# Patient Record
Sex: Female | Born: 1980
Health system: Southern US, Community
[De-identification: ages and names within clinical notes are randomized; demographics above are authoritative.]

## PROBLEM LIST (undated history)

## (undated) DIAGNOSIS — K219 Gastro-esophageal reflux disease without esophagitis: Secondary | ICD-10-CM

## (undated) DIAGNOSIS — R609 Edema, unspecified: Secondary | ICD-10-CM

## (undated) DIAGNOSIS — R0602 Shortness of breath: Secondary | ICD-10-CM

## (undated) DIAGNOSIS — T7840XA Allergy, unspecified, initial encounter: Secondary | ICD-10-CM

## (undated) DIAGNOSIS — B351 Tinea unguium: Secondary | ICD-10-CM

## (undated) DIAGNOSIS — Z91018 Allergy to other foods: Secondary | ICD-10-CM

## (undated) DIAGNOSIS — M109 Gout, unspecified: Secondary | ICD-10-CM

## (undated) DIAGNOSIS — M8560 Other cyst of bone, unspecified site: Secondary | ICD-10-CM

## (undated) DIAGNOSIS — J45909 Unspecified asthma, uncomplicated: Secondary | ICD-10-CM

## (undated) DIAGNOSIS — I10 Essential (primary) hypertension: Secondary | ICD-10-CM

## (undated) DIAGNOSIS — B353 Tinea pedis: Secondary | ICD-10-CM

## (undated) DIAGNOSIS — M2550 Pain in unspecified joint: Secondary | ICD-10-CM

## (undated) DIAGNOSIS — F329 Major depressive disorder, single episode, unspecified: Secondary | ICD-10-CM

## (undated) DIAGNOSIS — F32A Depression, unspecified: Secondary | ICD-10-CM

## (undated) DIAGNOSIS — F419 Anxiety disorder, unspecified: Secondary | ICD-10-CM

## (undated) DIAGNOSIS — L02419 Cutaneous abscess of limb, unspecified: Secondary | ICD-10-CM

## (undated) DIAGNOSIS — M1990 Unspecified osteoarthritis, unspecified site: Secondary | ICD-10-CM

## (undated) DIAGNOSIS — L03119 Cellulitis of unspecified part of limb: Secondary | ICD-10-CM

## (undated) DIAGNOSIS — G4730 Sleep apnea, unspecified: Secondary | ICD-10-CM

## (undated) DIAGNOSIS — S8990XA Unspecified injury of unspecified lower leg, initial encounter: Secondary | ICD-10-CM

## (undated) DIAGNOSIS — M79609 Pain in unspecified limb: Secondary | ICD-10-CM

## (undated) DIAGNOSIS — S99929A Unspecified injury of unspecified foot, initial encounter: Secondary | ICD-10-CM

## (undated) DIAGNOSIS — S99919A Unspecified injury of unspecified ankle, initial encounter: Secondary | ICD-10-CM

## (undated) HISTORY — PX: UPPER GI ENDOSCOPY: SHX6162

## (undated) HISTORY — DX: Gastro-esophageal reflux disease without esophagitis: K21.9

## (undated) HISTORY — DX: Depression, unspecified: F32.A

## (undated) HISTORY — DX: Unspecified injury of unspecified ankle, initial encounter: S99.919A

## (undated) HISTORY — DX: Unspecified injury of unspecified foot, initial encounter: S99.929A

## (undated) HISTORY — DX: Unspecified osteoarthritis, unspecified site: M19.90

## (undated) HISTORY — DX: Tinea unguium: B35.1

## (undated) HISTORY — DX: Allergy to other foods: Z91.018

## (undated) HISTORY — DX: Sleep apnea, unspecified: G47.30

## (undated) HISTORY — DX: Pain in unspecified joint: M25.50

## (undated) HISTORY — DX: Essential (primary) hypertension: I10

## (undated) HISTORY — DX: Pain in unspecified limb: M79.609

## (undated) HISTORY — DX: Cutaneous abscess of limb, unspecified: L02.419

## (undated) HISTORY — DX: Major depressive disorder, single episode, unspecified: F32.9

## (undated) HISTORY — DX: Anxiety disorder, unspecified: F41.9

## (undated) HISTORY — DX: Cellulitis of unspecified part of limb: L03.119

## (undated) HISTORY — DX: Tinea pedis: B35.3

## (undated) HISTORY — DX: Unspecified asthma, uncomplicated: J45.909

## (undated) HISTORY — DX: Edema, unspecified: R60.9

## (undated) HISTORY — DX: Unspecified injury of unspecified lower leg, initial encounter: S89.90XA

## (undated) HISTORY — DX: Allergy, unspecified, initial encounter: T78.40XA

## (undated) HISTORY — DX: Shortness of breath: R06.02

---

## 2002-06-19 ENCOUNTER — Encounter (INDEPENDENT_AMBULATORY_CARE_PROVIDER_SITE_OTHER): Payer: Self-pay | Admitting: Internal Medicine

## 2002-06-19 LAB — CONVERTED CEMR LAB

## 2004-02-11 ENCOUNTER — Encounter (INDEPENDENT_AMBULATORY_CARE_PROVIDER_SITE_OTHER): Payer: Self-pay | Admitting: *Deleted

## 2004-02-11 LAB — CONVERTED CEMR LAB

## 2004-05-21 ENCOUNTER — Emergency Department (HOSPITAL_COMMUNITY): Admission: EM | Admit: 2004-05-21 | Discharge: 2004-05-21 | Payer: Self-pay | Admitting: Emergency Medicine

## 2004-06-28 ENCOUNTER — Ambulatory Visit: Payer: Self-pay | Admitting: Family Medicine

## 2004-07-17 ENCOUNTER — Ambulatory Visit (HOSPITAL_BASED_OUTPATIENT_CLINIC_OR_DEPARTMENT_OTHER): Admission: RE | Admit: 2004-07-17 | Discharge: 2004-07-17 | Payer: Self-pay | Admitting: Sports Medicine

## 2004-08-06 ENCOUNTER — Ambulatory Visit: Payer: Self-pay | Admitting: Sports Medicine

## 2004-08-22 ENCOUNTER — Ambulatory Visit: Payer: Self-pay | Admitting: Sports Medicine

## 2004-09-11 ENCOUNTER — Ambulatory Visit: Payer: Self-pay | Admitting: Family Medicine

## 2004-10-29 ENCOUNTER — Ambulatory Visit: Payer: Self-pay | Admitting: Family Medicine

## 2004-11-06 ENCOUNTER — Ambulatory Visit: Payer: Self-pay | Admitting: Internal Medicine

## 2004-11-13 ENCOUNTER — Ambulatory Visit: Payer: Self-pay | Admitting: Family Medicine

## 2004-11-22 ENCOUNTER — Ambulatory Visit: Payer: Self-pay | Admitting: Internal Medicine

## 2004-11-29 ENCOUNTER — Ambulatory Visit: Payer: Self-pay | Admitting: *Deleted

## 2004-12-20 ENCOUNTER — Ambulatory Visit: Payer: Self-pay | Admitting: Internal Medicine

## 2004-12-30 ENCOUNTER — Emergency Department (HOSPITAL_COMMUNITY): Admission: EM | Admit: 2004-12-30 | Discharge: 2004-12-30 | Payer: Self-pay | Admitting: Emergency Medicine

## 2005-01-07 ENCOUNTER — Ambulatory Visit: Payer: Self-pay | Admitting: Internal Medicine

## 2005-01-22 ENCOUNTER — Ambulatory Visit: Payer: Self-pay | Admitting: Internal Medicine

## 2005-01-28 ENCOUNTER — Ambulatory Visit: Payer: Self-pay | Admitting: Internal Medicine

## 2005-01-31 ENCOUNTER — Ambulatory Visit: Payer: Self-pay | Admitting: Internal Medicine

## 2005-03-04 ENCOUNTER — Ambulatory Visit: Payer: Self-pay | Admitting: Internal Medicine

## 2005-03-26 ENCOUNTER — Ambulatory Visit: Payer: Self-pay | Admitting: Internal Medicine

## 2005-04-10 ENCOUNTER — Ambulatory Visit: Payer: Self-pay | Admitting: Family Medicine

## 2005-06-25 ENCOUNTER — Ambulatory Visit: Payer: Self-pay | Admitting: Family Medicine

## 2005-08-14 ENCOUNTER — Ambulatory Visit: Payer: Self-pay | Admitting: Family Medicine

## 2005-10-02 ENCOUNTER — Ambulatory Visit: Payer: Self-pay | Admitting: Family Medicine

## 2005-11-20 ENCOUNTER — Ambulatory Visit: Payer: Self-pay | Admitting: Family Medicine

## 2005-12-05 ENCOUNTER — Ambulatory Visit: Payer: Self-pay | Admitting: Family Medicine

## 2005-12-10 ENCOUNTER — Ambulatory Visit (HOSPITAL_BASED_OUTPATIENT_CLINIC_OR_DEPARTMENT_OTHER): Admission: RE | Admit: 2005-12-10 | Discharge: 2005-12-10 | Payer: Self-pay | Admitting: Family Medicine

## 2005-12-14 ENCOUNTER — Ambulatory Visit: Payer: Self-pay | Admitting: Family Medicine

## 2005-12-19 ENCOUNTER — Ambulatory Visit: Payer: Self-pay | Admitting: Internal Medicine

## 2006-01-20 ENCOUNTER — Encounter: Admission: RE | Admit: 2006-01-20 | Discharge: 2006-01-20 | Payer: Self-pay | Admitting: Family Medicine

## 2006-04-23 ENCOUNTER — Ambulatory Visit: Payer: Self-pay | Admitting: Family Medicine

## 2006-05-06 ENCOUNTER — Emergency Department (HOSPITAL_COMMUNITY): Admission: EM | Admit: 2006-05-06 | Discharge: 2006-05-06 | Payer: Self-pay | Admitting: Emergency Medicine

## 2006-05-11 ENCOUNTER — Ambulatory Visit (HOSPITAL_BASED_OUTPATIENT_CLINIC_OR_DEPARTMENT_OTHER): Admission: RE | Admit: 2006-05-11 | Discharge: 2006-05-11 | Payer: Self-pay | Admitting: Family Medicine

## 2006-05-16 ENCOUNTER — Ambulatory Visit: Payer: Self-pay | Admitting: Internal Medicine

## 2006-06-11 ENCOUNTER — Ambulatory Visit: Payer: Self-pay | Admitting: Family Medicine

## 2006-06-25 ENCOUNTER — Ambulatory Visit: Payer: Self-pay | Admitting: Family Medicine

## 2006-08-13 ENCOUNTER — Ambulatory Visit: Payer: Self-pay | Admitting: Family Medicine

## 2006-10-13 HISTORY — PX: WISDOM TOOTH EXTRACTION: SHX21

## 2006-10-23 ENCOUNTER — Emergency Department (HOSPITAL_COMMUNITY): Admission: EM | Admit: 2006-10-23 | Discharge: 2006-10-23 | Payer: Self-pay | Admitting: Emergency Medicine

## 2006-12-10 ENCOUNTER — Emergency Department (HOSPITAL_COMMUNITY): Admission: EM | Admit: 2006-12-10 | Discharge: 2006-12-10 | Payer: Self-pay | Admitting: Family Medicine

## 2006-12-10 DIAGNOSIS — G43909 Migraine, unspecified, not intractable, without status migrainosus: Secondary | ICD-10-CM | POA: Insufficient documentation

## 2006-12-10 DIAGNOSIS — J45909 Unspecified asthma, uncomplicated: Secondary | ICD-10-CM | POA: Insufficient documentation

## 2006-12-10 DIAGNOSIS — K219 Gastro-esophageal reflux disease without esophagitis: Secondary | ICD-10-CM | POA: Insufficient documentation

## 2006-12-10 DIAGNOSIS — F32A Depression, unspecified: Secondary | ICD-10-CM | POA: Insufficient documentation

## 2006-12-10 DIAGNOSIS — F329 Major depressive disorder, single episode, unspecified: Secondary | ICD-10-CM

## 2006-12-10 DIAGNOSIS — G4733 Obstructive sleep apnea (adult) (pediatric): Secondary | ICD-10-CM | POA: Insufficient documentation

## 2006-12-10 HISTORY — DX: Unspecified asthma, uncomplicated: J45.909

## 2006-12-11 ENCOUNTER — Encounter (INDEPENDENT_AMBULATORY_CARE_PROVIDER_SITE_OTHER): Payer: Self-pay | Admitting: *Deleted

## 2006-12-21 ENCOUNTER — Emergency Department (HOSPITAL_COMMUNITY): Admission: EM | Admit: 2006-12-21 | Discharge: 2006-12-21 | Payer: Self-pay | Admitting: Emergency Medicine

## 2006-12-29 ENCOUNTER — Ambulatory Visit: Payer: Self-pay | Admitting: Family Medicine

## 2006-12-31 ENCOUNTER — Ambulatory Visit: Payer: Self-pay | Admitting: Family Medicine

## 2007-02-09 ENCOUNTER — Ambulatory Visit: Payer: Self-pay | Admitting: Internal Medicine

## 2007-02-12 ENCOUNTER — Ambulatory Visit (HOSPITAL_COMMUNITY): Admission: RE | Admit: 2007-02-12 | Discharge: 2007-02-12 | Payer: Self-pay | Admitting: Internal Medicine

## 2007-02-18 ENCOUNTER — Encounter: Admission: RE | Admit: 2007-02-18 | Discharge: 2007-03-23 | Payer: Self-pay | Admitting: Internal Medicine

## 2007-04-01 ENCOUNTER — Ambulatory Visit: Payer: Self-pay | Admitting: Internal Medicine

## 2007-06-08 ENCOUNTER — Emergency Department (HOSPITAL_COMMUNITY): Admission: EM | Admit: 2007-06-08 | Discharge: 2007-06-08 | Payer: Self-pay | Admitting: Emergency Medicine

## 2007-07-06 ENCOUNTER — Encounter (INDEPENDENT_AMBULATORY_CARE_PROVIDER_SITE_OTHER): Payer: Self-pay | Admitting: Internal Medicine

## 2007-07-07 ENCOUNTER — Telehealth (INDEPENDENT_AMBULATORY_CARE_PROVIDER_SITE_OTHER): Payer: Self-pay | Admitting: *Deleted

## 2007-07-07 ENCOUNTER — Encounter (INDEPENDENT_AMBULATORY_CARE_PROVIDER_SITE_OTHER): Payer: Self-pay | Admitting: Internal Medicine

## 2007-09-13 ENCOUNTER — Emergency Department (HOSPITAL_COMMUNITY): Admission: EM | Admit: 2007-09-13 | Discharge: 2007-09-13 | Payer: Self-pay | Admitting: Family Medicine

## 2007-12-30 ENCOUNTER — Ambulatory Visit: Payer: Self-pay | Admitting: Nurse Practitioner

## 2007-12-30 DIAGNOSIS — L02419 Cutaneous abscess of limb, unspecified: Secondary | ICD-10-CM | POA: Insufficient documentation

## 2007-12-30 DIAGNOSIS — L03119 Cellulitis of unspecified part of limb: Secondary | ICD-10-CM | POA: Insufficient documentation

## 2007-12-30 HISTORY — DX: Cutaneous abscess of limb, unspecified: L02.419

## 2007-12-30 HISTORY — DX: Cellulitis of unspecified part of limb: L03.119

## 2008-01-22 ENCOUNTER — Emergency Department (HOSPITAL_COMMUNITY): Admission: EM | Admit: 2008-01-22 | Discharge: 2008-01-22 | Payer: Self-pay | Admitting: Family Medicine

## 2008-02-15 ENCOUNTER — Emergency Department (HOSPITAL_COMMUNITY): Admission: EM | Admit: 2008-02-15 | Discharge: 2008-02-15 | Payer: Self-pay | Admitting: Family Medicine

## 2008-03-20 ENCOUNTER — Inpatient Hospital Stay (HOSPITAL_COMMUNITY): Admission: AD | Admit: 2008-03-20 | Discharge: 2008-03-21 | Payer: Self-pay | Admitting: Gynecology

## 2008-03-20 ENCOUNTER — Emergency Department (HOSPITAL_COMMUNITY): Admission: EM | Admit: 2008-03-20 | Discharge: 2008-03-20 | Payer: Self-pay | Admitting: Emergency Medicine

## 2008-04-12 ENCOUNTER — Encounter (INDEPENDENT_AMBULATORY_CARE_PROVIDER_SITE_OTHER): Payer: Self-pay | Admitting: Nurse Practitioner

## 2008-04-15 ENCOUNTER — Inpatient Hospital Stay (HOSPITAL_COMMUNITY): Admission: AD | Admit: 2008-04-15 | Discharge: 2008-04-15 | Payer: Self-pay | Admitting: Obstetrics & Gynecology

## 2008-04-18 ENCOUNTER — Encounter (INDEPENDENT_AMBULATORY_CARE_PROVIDER_SITE_OTHER): Payer: Self-pay | Admitting: Family Medicine

## 2008-04-24 ENCOUNTER — Encounter (INDEPENDENT_AMBULATORY_CARE_PROVIDER_SITE_OTHER): Payer: Self-pay | Admitting: Internal Medicine

## 2008-05-04 ENCOUNTER — Ambulatory Visit: Payer: Self-pay | Admitting: Gynecology

## 2008-05-04 ENCOUNTER — Other Ambulatory Visit: Admission: RE | Admit: 2008-05-04 | Discharge: 2008-05-04 | Payer: Self-pay | Admitting: Gynecology

## 2008-05-04 ENCOUNTER — Encounter (INDEPENDENT_AMBULATORY_CARE_PROVIDER_SITE_OTHER): Payer: Self-pay | Admitting: Gynecology

## 2008-05-19 ENCOUNTER — Ambulatory Visit: Payer: Self-pay | Admitting: Gynecology

## 2008-11-11 ENCOUNTER — Emergency Department (HOSPITAL_COMMUNITY): Admission: EM | Admit: 2008-11-11 | Discharge: 2008-11-11 | Payer: Self-pay | Admitting: Emergency Medicine

## 2008-11-22 ENCOUNTER — Ambulatory Visit: Payer: Self-pay | Admitting: Family Medicine

## 2008-11-22 DIAGNOSIS — R609 Edema, unspecified: Secondary | ICD-10-CM

## 2008-11-22 DIAGNOSIS — F319 Bipolar disorder, unspecified: Secondary | ICD-10-CM | POA: Insufficient documentation

## 2008-11-22 DIAGNOSIS — M79609 Pain in unspecified limb: Secondary | ICD-10-CM

## 2008-11-22 DIAGNOSIS — B351 Tinea unguium: Secondary | ICD-10-CM | POA: Insufficient documentation

## 2008-11-22 DIAGNOSIS — B353 Tinea pedis: Secondary | ICD-10-CM | POA: Insufficient documentation

## 2008-11-22 HISTORY — DX: Tinea unguium: B35.1

## 2008-11-22 HISTORY — DX: Tinea pedis: B35.3

## 2008-11-22 HISTORY — DX: Edema, unspecified: R60.9

## 2008-11-22 HISTORY — DX: Pain in unspecified limb: M79.609

## 2008-12-07 ENCOUNTER — Ambulatory Visit: Payer: Self-pay | Admitting: Internal Medicine

## 2008-12-27 ENCOUNTER — Telehealth (INDEPENDENT_AMBULATORY_CARE_PROVIDER_SITE_OTHER): Payer: Self-pay | Admitting: Internal Medicine

## 2009-01-01 ENCOUNTER — Emergency Department (HOSPITAL_COMMUNITY): Admission: EM | Admit: 2009-01-01 | Discharge: 2009-01-01 | Payer: Self-pay | Admitting: Emergency Medicine

## 2009-01-01 DIAGNOSIS — S8990XA Unspecified injury of unspecified lower leg, initial encounter: Secondary | ICD-10-CM | POA: Insufficient documentation

## 2009-01-01 DIAGNOSIS — S99929A Unspecified injury of unspecified foot, initial encounter: Secondary | ICD-10-CM

## 2009-01-01 DIAGNOSIS — S99919A Unspecified injury of unspecified ankle, initial encounter: Secondary | ICD-10-CM

## 2009-01-01 HISTORY — DX: Unspecified injury of unspecified lower leg, initial encounter: S89.90XA

## 2009-02-01 ENCOUNTER — Ambulatory Visit: Payer: Self-pay | Admitting: Internal Medicine

## 2009-02-01 LAB — CONVERTED CEMR LAB
BUN: 13 mg/dL (ref 6–23)
CO2: 26 meq/L (ref 19–32)
Calcium: 9.3 mg/dL (ref 8.4–10.5)
Chloride: 106 meq/L (ref 96–112)
Creatinine, Ser: 0.84 mg/dL (ref 0.40–1.20)
Glucose, Bld: 83 mg/dL (ref 70–99)
Potassium: 4.2 meq/L (ref 3.5–5.3)
Sodium: 142 meq/L (ref 135–145)

## 2009-02-02 ENCOUNTER — Encounter: Admission: RE | Admit: 2009-02-02 | Discharge: 2009-02-02 | Payer: Self-pay | Admitting: Internal Medicine

## 2009-02-09 ENCOUNTER — Telehealth (INDEPENDENT_AMBULATORY_CARE_PROVIDER_SITE_OTHER): Payer: Self-pay | Admitting: Internal Medicine

## 2009-02-11 ENCOUNTER — Encounter (INDEPENDENT_AMBULATORY_CARE_PROVIDER_SITE_OTHER): Payer: Self-pay | Admitting: Internal Medicine

## 2009-02-27 ENCOUNTER — Telehealth (INDEPENDENT_AMBULATORY_CARE_PROVIDER_SITE_OTHER): Payer: Self-pay | Admitting: Internal Medicine

## 2009-06-28 ENCOUNTER — Emergency Department (HOSPITAL_COMMUNITY): Admission: EM | Admit: 2009-06-28 | Discharge: 2009-06-28 | Payer: Self-pay | Admitting: Family Medicine

## 2009-12-06 ENCOUNTER — Emergency Department (HOSPITAL_COMMUNITY): Admission: EM | Admit: 2009-12-06 | Discharge: 2009-12-06 | Payer: Self-pay | Admitting: Family Medicine

## 2010-03-12 ENCOUNTER — Emergency Department (HOSPITAL_COMMUNITY): Admission: EM | Admit: 2010-03-12 | Discharge: 2010-03-12 | Payer: Self-pay | Admitting: Emergency Medicine

## 2010-03-16 ENCOUNTER — Emergency Department (HOSPITAL_COMMUNITY): Admission: EM | Admit: 2010-03-16 | Discharge: 2010-03-16 | Payer: Self-pay | Admitting: Emergency Medicine

## 2010-06-19 ENCOUNTER — Emergency Department (HOSPITAL_COMMUNITY): Admission: EM | Admit: 2010-06-19 | Discharge: 2010-06-19 | Payer: Self-pay | Admitting: Family Medicine

## 2010-06-19 ENCOUNTER — Emergency Department (HOSPITAL_COMMUNITY): Admission: EM | Admit: 2010-06-19 | Discharge: 2010-06-19 | Payer: Self-pay | Admitting: Emergency Medicine

## 2010-07-29 ENCOUNTER — Encounter (INDEPENDENT_AMBULATORY_CARE_PROVIDER_SITE_OTHER): Payer: Self-pay | Admitting: Internal Medicine

## 2010-11-13 NOTE — Letter (Signed)
Summary: medical modalities  medical modalities   Imported By: Arta Bruce 07/19/2007 10:05:21  _____________________________________________________________________  External Attachment:    Type:   Image     Comment:   External Document

## 2010-11-13 NOTE — Letter (Signed)
Summary: REFERRAL/RADIOLOGY/APPT DATE & TIME  REFERRAL/RADIOLOGY/APPT DATE & TIME   Imported By: Arta Bruce 02/01/2009 11:53:46  _____________________________________________________________________  External Attachment:    Type:   Image     Comment:   External Document

## 2010-11-13 NOTE — Assessment & Plan Note (Signed)
Summary: knee pain////rjp   Vital Signs:  Patient profile:   30 year old female LMP:     01/23/2009 Temp:     98.1 degrees F Pulse rate:   88 / minute Resp:     18 per minute BP sitting:   102 / 72  (left arm) Cuff size:   thigh  Vitals Entered By: Vesta Mixer CMA (February 01, 2009 9:25 AM) CC: lt knee pain- fell off a [employer] last month and can't bend it properly Pain Assessment Patient in pain? yes     Location: lt knee Intensity: 6-7  Does patient need assistance? Ambulation Normal LMP (date): 01/23/2009  years   days  Enter LMP: 01/23/2009 Last PAP Result Done.   History of Present Illness: 1.  01/01/09:  Was backing dolly off ramp at back of [employer].  Stepped off top of ramp--estimates about 4 feet off ground when this occurred.  Felt a pop and a pull on medial side of left knee as came down on left foot.  Did have swelling of knee after injury.  Went to ED and was told had sprained ankle and knee on left. Xrays done of both joints.  No bony injury.  Was given Motrin and Vicodin as well as Flexeril in ED.  Boot placed on ankle.  Ace wrap for knee-but did not use as did not feel it helped.  Ankle fine now.  When bends down, still has fair amt of pulling sensation at back of left knee.  Some discomfort with walking long distance--pulls and pops at medial aspect.    Xrays of knee, ankle and tib/fib were essentially normal at ED--no effusion.  2.  Peripheral edema:  Furosemide working well.  Was never able to find compression stockings--but doing okay without them.        Allergies (verified): No Known Drug Allergies  Physical Exam  General:  morbidly obese Msk:  Difficulty bending knee beyond about 85 degrees.  Possibly some swelling over medial knee, but obesity makes evaluation difficult.  Very tender over medial joint margin around to back of knee.  Tender over lateral collateral ligament.  Significant pain on stress of medial collateral ligament.  No definite laxity  with collateral ligaments.  Some discomfort with streass of cruciates, but no definite laxity as well.  No pain on compression of patella Extremities:  No edema of ankles/feet.   Impression & Recommendations:  Problem # 1:  KNEE INJURY, LEFT (ICD-959.7)  Concern for medial meniscus tear with amt of pain and tenderness she is having.  Orders: Radiology Referral (Radiology)  Problem # 2:  DEPENDENT EDEMA, LEGS, BILATERAL (ICD-782.3) Improved. Her updated medication list for this problem includes:    Furosemide 40 Mg Tabs (Furosemide) .Marland Kitchen... 1 tab by mouth daily  Orders: T-Basic Metabolic Panel [phone])  Complete Medication List: 1)  Lamisil 250 Mg Tabs (Terbinafine hcl) .... Take 1 tablet by mouth once a day for 12 weeks. 2)  Lamisil At 1 % Crea (Terbinafine hcl) .... Apply cream to feet two times a day x 6 weeks 3)  Furosemide 40 Mg Tabs (Furosemide) .Marland Kitchen.. 1 tab by mouth daily 4)  Klor-con M20 20 Meq Cr-tabs (Potassium chloride crys cr) .Marland Kitchen.. 1 tab by mouth daily  Patient Instructions: 1)  Plan based on results of MRI

## 2010-11-13 NOTE — Progress Notes (Signed)
Summary: MRI Results  Phone Note Call from Patient Call back at [facility] Phone [phone] Call back at [phone]   Summary of Call: The pt would like to get the results from her MRI.  Mulberry MD  Initial call taken by: Manon Hilding,  February 09, 2009 11:50 AM  Follow-up for Phone Call        Fwd to pcp for results Follow-up by: Vesta Mixer CMA,  February 09, 2009 12:20 PM  Additional Follow-up for Phone Call Additional follow up Details #1::        Called and discussed bone contusions and partial ACL tear.   Doing okay. Discussed trying to get her in with ortho at [facility] can get transportation. Will get back with her next week regarding this. Additional Follow-up by: Julieanne Manson MD,  February 09, 2009 5:42 PM    Additional Follow-up for Phone Call Additional follow up Details #2::    Tiffany, can you check with [facility] outpatient ortho clinic and see if they would be willing to see Kharizma?  She has a partial ACL tear--injury occured back on 01/01/09. Last time I tried to send someone they're way, they weren't willing to see them.  Julieanne Manson MD  Feb 11, 2009 5:17 PM   Additional Follow-up for Phone Call Additional follow up Details #3:: Details for Additional Follow-up Action Taken: Will try for [facility] referral and let you know. Additional Follow-up by: Vesta Mixer CMA,  Feb 19, 2009 3:48 PM

## 2010-11-13 NOTE — Miscellaneous (Signed)
Summary: ROI [employer]  ROI [employer]   Imported By: Knox Royalty 05/18/2008 14:48:57  _____________________________________________________________________  External Attachment:    Type:   Image     Comment:   External Document

## 2010-11-13 NOTE — Assessment & Plan Note (Signed)
Summary: Acute - Cellulitis   Vital Signs:  Patient Profile:   30 Years Old Female Temp:     98.7 degrees F oral Pulse rate:   80 / minute Pulse rhythm:   regular Resp:     20 per minute (left arm) Cuff size:   regular  Pt. in pain?   yes    Location:   right leg    Intensity:   7    Type:       throbbing/burning/sore  Vitals Entered By: Murvin Natal SMA (December 30, 2007 2:41 PM)              Is Patient Diabetic? No  Does patient need assistance? Ambulation Normal     Chief Complaint:  right leg pain, swelling, redness, and leg is holding heat but patient is very cold.Marland Kitchen  History of Present Illness:  Right lower ext edema - started 3 days ago and has progressively gotten worse.  Right leg is swollen, there is a red area.  pt has not been able to bear weight on this leg.   She reports that the leg feels warm but the rest of her body is cold.  in fact she is shivering in the exam room. 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  Presents today with her mother.    Prior Medication List:  No prior medications documented  Current Allergies: No known allergies     Risk Factors:  Tobacco use:  current    Cigarettes:  Yes -- 1/2 pack(s) per day Drug use:  no Alcohol use:  no Exercise:  yes    Times per week:  7    Type:  walk Seatbelt use:  0 % Sun Exposure:  frequently   Review of Systems  General      Complains of fever.  GI      Denies vomiting.  MS      Right leg pain   Physical Exam  General:     alert.  morbid obese Head:     normocephalic.   Msk:     right lower extremity with well demarcated area of erythema. extremity tender to touch, edematous. no lacerations or excoriation Neurologic:     acutely ill    Impression & Recommendations:  Problem # 1:  CELLULITIS AND ABSCESS OF LEG EXCEPT FOOT (ICD-682.6) handout given. pt to start antibiotics at home. alternate tylenol and  ibuprofen for pain/fever out of work for the next 3 days to prevent extended periods of standing Her updated medication list for this problem includes:    Keflex 500 Mg Caps (Cephalexin) .Marland Kitchen... 1 tablet by mouth three times a day for infection  Orders: Ketorolac-Toradol 15mg  (E4540) Rocephin  250mg  (J8119) Admin of Therapeutic Inj  intramuscular or subcutaneous (14782)   Complete Medication List: 1)  Naproxen Dr 500 Mg Tbec (Naproxen) 2)  Relpax 40 Mg Tabs (Eletriptan hydrobromide) 3)  Zyrtec Allergy 10 Mg Tabs (Cetirizine hcl) 4)  Keflex 500 Mg Caps (Cephalexin) .Marland Kitchen.. 1 tablet by mouth three times a day for infection   Patient Instructions: 1)  Take antibiotics as ordered 2)  Alternate tylenol and motrin for pain and fever 3)  elevate leg while at home. No work for the next 3 days. 4)  apply warm compresses to affected area for 15 minute increments. 5)  Follow up in 3-5 days if symptoms persist.    Prescriptions: KEFLEX 500 MG  CAPS (CEPHALEXIN) 1 tablet by mouth three times a day for infection  #30 x 0   Entered and Authorized by:   Lehman Prom FNP   Signed by:   Lehman Prom FNP on 12/30/2007   Method used:   Print then Give to Patient   RxID:   [id number]  ]  Medication Administration  Injection # 1:    Medication: Ketorolac-Toradol 15mg     Diagnosis: CELLULITIS AND ABSCESS OF LEG EXCEPT FOOT (ICD-682.6)    Route: IM    Site: R deltoid    Exp Date: 06/13/2009    Lot #: [id number]    Mfr: [facility]    Comments: ndc [phone]    Patient tolerated injection without complications    Given by: Murvin Natal SMA (December 30, 2007 4:04 PM)  Injection # 2:    Medication: Rocephin  250mg     Diagnosis: CELLULITIS AND ABSCESS OF LEG EXCEPT FOOT (ICD-682.6)    Route: IM    Site: L deltoid    Exp Date: 03/13/2008    Lot #: [id number]    Mfr: [employer]    Comments: ndc [phone]    Patient tolerated injection without complications     Given by: Murvin Natal SMA (December 30, 2007 4:07 PM)  Orders Added: 1)  Est. Patient Level III [99213] 2)  Ketorolac-Toradol 15mg  [J1885] 3)  Rocephin  250mg  [J0696] 4)  Admin of Therapeutic Inj  intramuscular or subcutaneous [32951]

## 2010-11-13 NOTE — Assessment & Plan Note (Signed)
Summary: MEDICATION NOT WORKING///KT   Vital Signs:  Patient Profile:   30 Years Old Female Temp:     97.3 degrees F Pulse rate:   84 / minute Pulse rhythm:   regular Resp:     20 per minute BP sitting:   128 / 84  (left arm) Cuff size:   thigh  Pt. in pain?   no  Vitals Entered By: Vesta Mixer CMA (December 07, 2008 2:38 PM) Weight > 350 lbs. Yes              Is Patient Diabetic? No  Does patient need assistance? Ambulation Normal     Chief Complaint:  f/u leg swelling has been not eating salt for the past two weeks and but still not seen a difference.  Does not feel like hctz is working she increaed it to 25mg  the past 3 days.  She said about in 2003 or 2004 she was on lasix and kcl..  History of Present Illness: 1.  Leg swelling:  Has really backed off salt and food eating at restaurant.  Still with swelling.  Has increased HCTZ over past 3 days without improvement.  Has had diffiuclty with this in past few years.  Lasix worked better for her in past.  Had to take potassium supplement.  On feet a fair amt.  Has been sitting in a recliner when sitting--legs up.  Legs really no better in morning when arises.  No dyspnea.      Current Allergies: No known allergies       Physical Exam  General:     morbidly obese Lungs:     Normal respiratory effort, chest expands symmetrically. Lungs are clear to auscultation, no crackles or wheezes. Heart:     Normal rate and regular rhythm. S1 and S2 normal without gallop, murmur, click, rub or other extra sounds.  Radial pulses normal and equal. Extremities:     mild pitting edema to just below knees bilaterally    Impression & Recommendations:  Problem # 1:  DEPENDENT EDEMA, LEGS, BILATERAL (ICD-782.3) To look into compression stockings--knee high The following medications were removed from the medication list:    Hydrochlorothiazide 12.5 Mg Tabs (Hydrochlorothiazide) .Marland Kitchen... Take 1 tab  by mouth every morning  Her  updated medication list for this problem includes:    Furosemide 40 Mg Tabs (Furosemide) .Marland Kitchen... 1 tab by mouth daily   Complete Medication List: 1)  Lamisil 250 Mg Tabs (Terbinafine hcl) .... Take 1 tablet by mouth once a day for 12 weeks. 2)  Lamisil At 1 % Crea (Terbinafine hcl) .... Apply cream to feet two times a day x 6 weeks 3)  Furosemide 40 Mg Tabs (Furosemide) .Marland Kitchen.. 1 tab by mouth daily 4)  Klor-con M20 20 Meq Cr-tabs (Potassium chloride crys cr) .Marland Kitchen.. 1 tab by mouth daily   Patient Instructions: 1)  1000 milligrams (mg)= 1 gram (g) 2)  Try to stay under 2000-4000mg  of sodium daily 3)  Check out [employer] on the internet for compression Editor, commissioning on [address] next to [employer]   Prescriptions: KLOR-CON M20 20 MEQ CR-TABS (POTASSIUM CHLORIDE CRYS CR) 1 tab by mouth daily  #30 x 2   Entered and Authorized by:   Julieanne Manson MD   Signed by:   Julieanne Manson MD on 12/07/2008   Method used:   Print then Give to Patient   RxID:   [id number] FUROSEMIDE 40 MG TABS (FUROSEMIDE) 1 tab by mouth daily  #  30 x 2   Entered and Authorized by:   Julieanne Manson MD   Signed by:   Julieanne Manson MD on 12/07/2008   Method used:   Print then Give to Patient   RxID:   [id number]   Appended Document: MEDICATION NOT WORKING///KT Call for follow up in 3 months

## 2010-11-13 NOTE — Letter (Signed)
Summary: *Referral Letter  [facility]  [address] [place], [place] [address]   Phone: [phone]  Fax: [phone]    02/11/2009  Dear Doctor:  Thank you in advance for agreeing to see my patient:  Catherine [name] [address] [place], [place]  [address]  Phone: [phone]  Reason for Referral: Partial tear of Left ACL.  Injury occurred 01/01/09 when accidentally stepped off top of ramp at back of [employer] truck.  Pt. estimated about a 4 foot drop, came down on left foot, twisting knee and ankle.  Is weight bearing, but presented to our office with continued medial joint margin pain and tenderness.  MRI performed with above results.  Reportedly no meniscal injury by MRI.  Procedures Requested: Evaluation and recommendation for long term treatment to avoid progressive injury to ligaments and joint.  I have not yet started her with PT--would like ortho opinion on best course of action first.  Current Medical Problems: 1)  KNEE INJURY, LEFT (ICD-959.7) 2)  ONYCHOMYCOSIS, TOENAILS (ICD-110.1) 3)  TINEA PEDIS (ICD-110.4) 4)  FOOT PAIN, RIGHT (ICD-729.5) 5)  DEPENDENT EDEMA, LEGS, BILATERAL (ICD-782.3) 6)  BIPOLAR DISORDER UNSPECIFIED (ICD-296.80) 7)  CELLULITIS AND ABSCESS OF LEG EXCEPT FOOT (ICD-682.6) 8)  DEPRESSION (ICD-311) 9)  OBESITY, NOS (ICD-278.00) 10)  MIGRAINE, UNSPEC., W/O INTRACTABLE MIGRAINE (ICD-346.90) 11)  GASTROESOPHAGEAL REFLUX, NO ESOPHAGITIS (ICD-530.81) 12)  DEPRESSIVE DISORDER, NOS (ICD-311) 13)  ASTHMA, UNSPECIFIED (ICD-493.90) 14)  APNEA, SLEEP (ICD-780.57)   Current Medications: 1)  LAMISIL 250 MG TABS (TERBINAFINE HCL) Take 1 tablet by mouth once a day for 12 weeks. 2)  LAMISIL AT 1 % CREA (TERBINAFINE HCL) Apply cream to feet two times a day x 6 weeks 3)  FUROSEMIDE 40 MG TABS (FUROSEMIDE) 1 tab by mouth daily 4)  KLOR-CON M20 20 MEQ CR-TABS (POTASSIUM CHLORIDE CRYS CR) 1 tab by mouth daily   Past Medical History: 1)   H/O abnormal pap smear-2003 2)  , f/u Colpo nL per pt., Mild OSA by sleep study,  3)  [facility] at [facility] for [facility] attempt. 4)  Depression       Thank you again for agreeing to see our patient; please contact us if you have any further questions or need additional information.  Sincerely,  Julieanne Manson MD

## 2010-11-13 NOTE — Letter (Signed)
Summary: Handout Printed  Printed Handout:  - Cellulitis

## 2010-11-13 NOTE — Progress Notes (Signed)
Summary: Referral Request  Phone Note Call from Patient Call back at Home Phone [phone]   Caller: Patient Summary of Call: The pt wants the provider referral her to an ophthalmology because it is time for her to change her glasses and also to be referral to a dentist. Dr. Delrae Alfred Initial call taken by: Manon Hilding,  December 27, 2008 9:20 AM  Follow-up for Phone Call        Spoke with pt she is requesting referral for eye exam and dental referral for cleaning.  Explained to pt no availabilty for dental right now, but if just needs cleaning can go to [facility].  Also gave info for [employer] in [place] for eye exam as pt does not have any payer source. Follow-up by: Vesta Mixer CMA,  December 27, 2008 10:18 AM  Additional Follow-up for Phone Call Additional follow up Details #1::        Thanks. Additional Follow-up by: Julieanne Manson MD,  December 27, 2008 12:27 PM

## 2010-11-13 NOTE — Assessment & Plan Note (Signed)
Summary: 2209 PT/LEGS SWELLING//GK   Vital Signs:  Patient Profile:   30 Years Old Female LMP:     11/02/2008 Temp:     97.6 degrees F Pulse rate:   88 / minute Pulse rhythm:   regular Resp:     20 per minute BP sitting:   128 / 84  (left arm) Cuff size:   thigh  Pt. in pain?   no  Vitals Entered By: Vesta Mixer CMA (November 22, 2008 9:32 AM)  Menstrual History: LMP (date): 11/02/2008 Weight > 350 lbs. Yes              Is Patient Diabetic? No  Does patient need assistance? Ambulation Normal     Chief Complaint:  feet/legs swollen x 1 1/2 week; also had nose bleed sunday.Marland Kitchen  History of Present Illness: PCP=Mulberry Here for f/u on LE swelling.Was seen in ER on 11/11/2008 for "foot rash and pain'.She was worried that her cellulitis of right LE was reoccurring.She had no redness or skin-breakdown.She was given a script for "mupirocin"cream but did not fill because of cost. Her main concern is right foot pain across bottom foot and at heel. Worse first thing in am and with walking.She also has foot rash and thick nails.   No h/o recent medications.No longer taking her [facility] meds...none since 12/2007. Pt says doing well with her depression and bipolar. No suicidal or homicidal ideation or plans.  No prior h/o liver disease.    Updated Prior Medication List: Per pt not taking any meds right now. Current Allergies: No known allergies   Past Medical History:    Reviewed history from 07/06/2007 and no changes required:       H/O abnormal pap smear-2003       , f/u Colpo nL per pt., Mild OSA by sleep study,        [facility] at [facility] in past for [facility] attempt.       Depression   Social History:    Lives with companion, Dierdre.  Also lives with Dierdre's brother-in-law and 2 sons.  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  Smokes 1 ppd for 11 years.  Some Etoh.  Eats a healthy diet and exercises fairly regularly.         Stopped  smoking 10/25/2008    No ETOH.    No street drugs     Physical Exam  General:     Obese Extremities:     tender to palpation at bottom and lateral aspect of heel.No rash/erythema. Does have tinea of both feet and thick nails c/w onychomycosis.Back of right lower extremity showes some lichenification but no active cellulitis or skin-breakdown.No calf pain;just foot pain. Has some bilateral 1+ pitting edema LE.    Impression & Recommendations:  Problem # 1:  FOOT PAIN, RIGHT (ICD-729.5) Suspect [place]el[place] given area of tenderness.No evidence for infection/skin break-down or cellulitis. Discussed supportive shoes/heel cups/ice packs at end of day and stretches.Weight is factor and weight loss would help.  Problem # 2:  DEPENDENT EDEMA, LEGS, BILATERAL (ICD-782.3) Avoid salt. Trial of HCTZ 12.5mg  once daily. Elevate legs/support hose/weight loss.  Her updated medication list for this problem includes:    Furosemide 40 Mg Tabs (Furosemide) .Marland Kitchen... 1 tab by mouth daily   Problem # 3:  ONYCHOMYCOSIS, TOENAILS (ICD-110.1)  Her updated medication list for this problem includes:    Lamisil 250 Mg Tabs (Terbinafine hcl) .Marland Kitchen... Take 1 tablet by mouth once a  day for 12 weeks.    Lamisil At 1 % Crea (Terbinafine hcl) .Marland Kitchen... Apply cream to feet two times a day x 6 weeks   Problem # 4:  TINEA PEDIS (ICD-110.4)  Her updated medication list for this problem includes:    Lamisil 250 Mg Tabs (Terbinafine hcl) .Marland Kitchen... Take 1 tablet by mouth once a day for 12 weeks.    Lamisil At 1 % Crea (Terbinafine hcl) .Marland Kitchen... Apply cream to feet two times a day x 6 weeks   Complete Medication List: 1)  Lamisil 250 Mg Tabs (Terbinafine hcl) .... Take 1 tablet by mouth once a day for 12 weeks. 2)  Lamisil At 1 % Crea (Terbinafine hcl) .... Apply cream to feet two times a day x 6 weeks 3)  Furosemide 40 Mg Tabs (Furosemide) .Marland Kitchen.. 1 tab by mouth daily 4)  Klor-con M20 20 Meq Cr-tabs (Potassium chloride crys cr) .Marland Kitchen..  1 tab by mouth daily   Patient Instructions: 1)  Use aquaphor ointment  and nasal saline spray for dry,irritated nose. 2)  Notify all doctors that you are on the Lamisil medication.   Prescriptions: LAMISIL AT 1 % CREA (TERBINAFINE HCL) Apply cream to feet two times a day x 6 weeks  #45 grams x 2   Entered and Authorized by:   Beverley Fiedler MD   Signed by:   Beverley Fiedler MD on 11/22/2008   Method used:   Print then Give to Patient   RxID:   [id number] LAMISIL 250 MG TABS (TERBINAFINE HCL) Take 1 tablet by mouth once a day for 12 weeks.  #30 x 3   Entered and Authorized by:   Beverley Fiedler MD   Signed by:   Beverley Fiedler MD on 11/22/2008   Method used:   Print then Give to Patient   RxID:   [id number] HYDROCHLOROTHIAZIDE 12.5 MG  TABS (HYDROCHLOROTHIAZIDE) Take 1 tab  by mouth every morning  #30 x 3   Entered and Authorized by:   Beverley Fiedler MD   Signed by:   Beverley Fiedler MD on 11/22/2008   Method used:   Print then Give to Patient   RxID:   [id number]

## 2010-11-13 NOTE — Letter (Signed)
Summary: medical modalities  medical modalities   Imported By: Arta Bruce 07/19/2007 10:07:21  _____________________________________________________________________  External Attachment:    Type:   Image     Comment:   External Document

## 2010-11-13 NOTE — Letter (Signed)
Summary: Out of Work  [facility]  [address] [place], [place] [address]   Phone: [phone]  Fax: [phone]    December 30, 2007   Employee:  [employer]ULENA [employer]AIS    To Whom It May Concern:   For Medical reasons, please excuse the above named employee from work for the following dates:  Start:   December 30, 2007  End:   January 03, 2008  If you need additional information, please feel free to contact our office.         Sincerely,    Lehman Prom FNP

## 2010-11-13 NOTE — Letter (Signed)
Summary: [facility]   FAMILY PRACTICE   Imported By: Arta Bruce 08/31/2008 12:22:13  _____________________________________________________________________  External Attachment:    Type:   Image     Comment:   External Document

## 2010-11-13 NOTE — Progress Notes (Signed)
Summary: ORTHOPEDIC REFERRAL   Phone Note Call from Patient   Caller: Patient Reason for Call: Referral Summary of Call: PT WANTS TO KNOW THE STATUS OF THE REFERRAL TO A ORTHOPEDIC SURGEON IN [place] . PLEASE, CALL HER BACK @ [phone]  THANK YOU .  Initial call taken by: Cheryll Dessert,  Feb 27, 2009 3:00 PM  Follow-up for Phone Call        Ref faxed to [facility].  Waiting on reply now. Follow-up by: Vesta Mixer CMA,  Mar 05, 2009 3:32 PM  Additional Follow-up for Phone Call Additional follow up Details #1::        pt aware we are waiting on reply Additional Follow-up by: Vesta Mixer CMA,  Mar 07, 2009 10:50 AM

## 2010-11-13 NOTE — Progress Notes (Signed)
Summary: med records  Phone Note Call from Patient Call back at Home Phone [phone]   Caller: Significant Other Summary of Call: Marylu Lund from [facility] office called requesting for medical records from this patient on December 29, 2006 and April 29.2008. Dr Delrae Alfred. For more details you can easily reach her at [phone] fax number [phone] Initial call taken by: Manon Hilding,  July 07, 2007 11:47 AM

## 2010-11-13 NOTE — Letter (Signed)
Summary: FAXED RECORDS TO [facility]  FAXED RECORDS TO [facility]   Imported By: Arta Bruce 04/12/2008 11:27:46  _____________________________________________________________________  External Attachment:    Type:   Image     Comment:   External Document

## 2010-11-13 NOTE — Letter (Signed)
Summary: MAILED REQUESTED RECORDS TO [facility]  MAILED REQUESTED RECORDS TO [facility]   Imported By: Arta Bruce 07/29/2010 14:30:10  _____________________________________________________________________  External Attachment:    Type:   Image     Comment:   External Document

## 2010-12-26 LAB — POCT CARDIAC MARKERS
CKMB, poc: <1 ng/mL — ABNORMAL LOW (ref 1.0–8.0)
Myoglobin, poc: 84.3 ng/mL (ref 12–200)
Troponin i, poc: <0.05 ng/mL (ref 0.00–0.09)

## 2010-12-26 LAB — POCT I-STAT, CHEM 8
BUN: 9 mg/dL (ref 6–23)
BUN: 9 mg/dL (ref 6–23)
Calcium, Ion: 1.12 mmol/L (ref 1.12–1.32)
Calcium, Ion: 1.15 mmol/L (ref 1.12–1.32)
Chloride: 103 mEq/L (ref 96–112)
Chloride: 104 mEq/L (ref 96–112)
Creatinine, Ser: 0.9 mg/dL (ref 0.4–1.2)
Creatinine, Ser: 0.9 mg/dL (ref 0.4–1.2)
Glucose, Bld: 71 mg/dL (ref 70–99)
Glucose, Bld: 87 mg/dL (ref 70–99)
HCT: 43 % (ref 36.0–46.0)
HCT: 45 % (ref 36.0–46.0)
Hemoglobin: 14.6 g/dL (ref 12.0–15.0)
Hemoglobin: 15.3 g/dL — ABNORMAL HIGH (ref 12.0–15.0)
Potassium: 3.8 mEq/L (ref 3.5–5.1)
Potassium: 3.9 mEq/L (ref 3.5–5.1)
Sodium: 141 mEq/L (ref 135–145)
Sodium: 141 mEq/L (ref 135–145)
TCO2: 27 mmol/L (ref 0–100)
TCO2: 27 mmol/L (ref 0–100)

## 2010-12-26 LAB — D-DIMER, QUANTITATIVE: D-Dimer, Quant: 0.45 ug/mL-FEU (ref 0.00–0.48)

## 2011-02-25 NOTE — Group Therapy Note (Signed)
[name], [name] NO.:  [id number]   MEDICAL RECORD NO.:  [id number]          PATIENT TYPE:  WOC   LOCATION:  [facility]                   FACILITY:  [facility]   PHYSICIAN:  Ginger Carne, MD DATE OF BIRTH:  26-Mar-1981   DATE OF SERVICE:  05/19/2008                                  CLINIC NOTE   Ms. Lomax returns today for followup regarding her results.  She is a  30 year old morbidly obese female weighing 409 pounds who has had  episodes of amenorrhea punctuated by heavy vaginal bleeding.  Transvaginal ultrasound was unremarkable apart from a 3.5-cm simple left  ovarian cyst and her endometrial biopsy was normal.  There was no  evidence of neoplasia or hyperplasia and benign proliferative  endometrium was noted.  GC and Chlamydia cultures were negative and Pap  smear as well was normal.   RECOMMENDATIONS:  The patient will continue advice from her  recommendations in the past to continue with Sprintec oral  contraceptives.  I explained to her the importance of having regular  monthly menses to avoid long episodes of amenorrhea which could  contribute to precancerous or cancerous conditions of the uterus.  She  was advised to return if she has any problems on the pill including  persistent bleeding or amenorrhea and otherwise to see her back in 1  year.  The patient is satisfied with explanation and a prescription  given to her.           ______________________________  Ginger Carne, MD     SHB/MEDQ  D:  05/19/2008  T:  05/20/2008  Job:  [id number]

## 2011-02-25 NOTE — Group Therapy Note (Signed)
Catherine [name], Catherine [name] NO.:  [id number]   MEDICAL RECORD NO.:  [id number]          PATIENT TYPE:  WOC   LOCATION:  [facility]                   FACILITY:  [facility]   PHYSICIAN:  Ginger Carne, MD DATE OF BIRTH:  November 17, 1980   DATE OF SERVICE:  05/04/2008                                  CLINIC NOTE   This patient is a 30 year old nulligravida obese female whose weight is  400.9 pounds presenting from referral by the maternity admission unit  with heavy vaginal bleeding.  The patient had been seen in the MAU  initially on March 20, 2008.  Her complaint was primarily heavy vaginal  bleeding which was preceded by amenorrhea for approximately 4 years.  The patient was evaluated and sent here for management.  She was  initially treated with Provera 30 mg a day for 7 days and then followed  by Sprintec.  At this time she has stopped bleeding.  Transvaginal  ultrasound obtained at the time of her MAU visit demonstrated  endometrial lining of 12 mm.  No evidence of intramyometrial lesions or  intracavitary lesions.  A 3.5 cm left ovarian cyst was noted.   ALLERGIES:  NONE.   CURRENT MEDICATIONS:  1. Seroquel 100 mg one daily.  2. Lamictal 100 mg one daily.  3. Zoloft 100 mg twice a day.  4. Propranolol 80 mg daily.  5. Zyrtec 40 mg daily.  6. Seroquel was 300 mg at bedtime.   MEDICAL HISTORY:  Includes mental illness.   PAST SURGICAL HISTORY:  None   SOCIAL HISTORY:  The patient smokes one pack of cigarettes a day.  Denies alcohol or illicit drug abuse.   FAMILY HISTORY:  Her father has had prostate carcinoma and hypertension  and her mother, sisters and brothers have hypertension.  She has one  brother who has had a myocardial infarction.   REVIEW OF SYSTEMS:  The patient complains of vaginal pain without  discharge for many years.   PHYSICAL EXAMINATION:  Weight 400.9 pounds, height 5 feet, 9 inches,  blood pressure 127/86, pulse 98 and regular.  ABDOMEN:   Reveals central obesity.  PELVIC EXAM:  External genitalia, vulva and vagina normal.  Cervix  smooth without erosions or lesions.  Endometrial biopsy obtained.  Pelvic exam was deferred due to significant discomfort and recent  ultrasound findings.  The patient had significant pain with the  endometrial biopsy and it was deemed appropriate and sensitive to not do  a bimanual exam.   IMPRESSION:  Anovulatory bleeding.   PLAN:  1. The patient was continued on Sprintec and advised as to the      importance of continuing said medication to avoid anovulatory      cycles and risk of precancerous and cancerous conditions of the      uterus.  2. She was asked to return in 2 weeks to follow up on the endometrial      biopsy report.           ______________________________  Ginger Carne, MD     SHB/MEDQ  D:  05/04/2008  T:  05/04/2008  Job:  [id number] 

## 2011-02-28 NOTE — Procedures (Signed)
Catherine [name], Catherine [name]              ACCOUNT NO.:  [id number]   MEDICAL RECORD NO.:  [id number]          PATIENT TYPE:  OUT   LOCATION:  SLEEP CENTER                 FACILITY:  [facility]   PHYSICIAN:  Clinton D. Maple Hudson, M.D. DATE OF BIRTH:  11/11/1980   DATE OF STUDY:                              NOCTURNAL POLYSOMNOGRAM   REFERRING PHYSICIAN:  Dr. Nadyne Coombes.   DATE OF THE STUDY:  December 10, 2005.   INDICATIONS FOR STUDY:  Hypersomnia with sleep apnea. Epworth sleepiness  score 18/24, BMI 51, weight 345 pounds. Home medication: Seroquel, Advair,  Zoloft, Lamictal, Nasalcrom, Allegra, propranolol. A baseline diagnostic  NPSG on July 17, 2004 had recorded an AHI of 6.9 per hour. The patient  reports she has lost weight since then but has had increased headaches.   SLEEP ARCHITECTURE:  Total sleep time 434 minutes with sleep efficiency 98%.  Stage I was 1%, stage II 76%, stages III and IV 16%, REM 9% of total sleep  time. Sleep latency 9 minutes, REM latency 229 minutes, awake after sleep  onset 3.5 minutes which is unusually short. Arousal index 18.2. Seroquel 300  mg was taken at 9 p.m.   RESPIRATORY DATA:  Apnea-hypopnea index (AHI, RDI) 13.7 obstructive events  per hour indicating mild obstructive sleep apnea/hypopnea syndrome. This  reflected 9 obstructive apneas, 1 mixed apnea and 89 hypopneas. Most events  were not seen until after midnight. She slept only supine. REM AHI 63.1 per  hour. She had insufficient early events to permit time for CPAP titration by  split protocol on this night.   OXYGEN DATA:  Mild to moderate snoring with oxygen desaturation to a nadir  of 85%. Mean oxygen saturation through the study was 97% on room air.   CARDIAC DATA:  Sinus rhythm without ectopics.   MOVEMENT/PARASOMNIA:  Occasional limb jerk but with little effect on sleep.   IMPRESSION/RECOMMENDATION:  1.  Mild obstructive sleep apnea/hypopnea syndrome, apnea-hypopnea index      13.7 per  hour with all events recorded while sleeping supine.      Significantly more frequent in rapid eye movement. Mild to moderate      snoring with oxygen desaturation to a nadir of 85%.  2.  Continuous positive airway pressure therapy may be appropriate for      scores in this range if more conservative      measures such as weight loss and sleeping off flat of back are      insufficient. Consider return for continuous positive airway pressure      titration or evaluate for alternative therapies as appropriate.      Clinton D. Maple Hudson, M.D.  Diplomate, Biomedical engineer of Sleep Medicine  Electronically Signed     CDY/MEDQ  D:  12/14/2005 10:36:49  T:  12/15/2005 09:51:14  Job:  [id number]

## 2011-02-28 NOTE — Procedures (Signed)
Catherine [name], Catherine [name]              ACCOUNT NO.:  [id number]   MEDICAL RECORD NO.:  [id number]          PATIENT TYPE:  OUT   LOCATION:  SLEEP CENTER                 [facility]ACILITY:  [facility]   PHYSICIAN:  Clinton D. Maple Hudson, M.D. DATE OF BIRTH:  05/28/1981   DATE OF STUDY:  07/17/2004  DATE OF DISCHARGE:  07/17/2004                              NOCTURNAL POLYSOMNOGRAM   REFERRING PHYSICIAN:  Dr. Enid Baas   INDICATION FOR STUDY:  Hypersomnia with sleep apnea.  Epworth sleepiness  score 20/24.  BMI 53.  Weight 365 pounds.   SLEEP ARCHITECTURE:  Total sleep time 302 minutes with sleep efficiency 88%.  Stage I was 4%, stage II 65%, stages III and IV 14%.  REM was 17% of total  sleep time.  Sleep latency 17 minutes.  REM latency 97 minutes.  Awake after  sleep onset 4.5 minutes.  Arousal index 39 which is increased.  Most of  these arousals were spontaneous.  No bedtime medication was taken.   RESPIRATORY DATA:  RDI 6.9/hour indicating mild obstructive sleep  apnea/hypopnea syndrome.  This included 14 obstructive apneas and 21  hypopneas.  Events were noted while sleeping on right side and supine.  There were insufficient events to permit use of split protocol CPAP  titration.  REM RDI was 29 indicating predominance of respiratory events in  REM sleep.   OXYGEN DATA:  Moderate snoring with desaturation to a nadir of 82% during  respiratory events.  Mean oxygen saturation through the study was 94-95% on  room air.   CARDIAC DATA:  Normal sinus rhythm.   MOVEMENT/PARASOMNIA:  Occasional leg jerks with insignificant impact on  sleep quality.   IMPRESSION/RECOMMENDATION:  Mild obstructive sleep apnea/hypopnea syndrome,  RDI 6.9/hour with desaturation to 82%.  Since there was predominance in REM,  REM suppression therapy could be considered but REM was only 17% of the  study night anyway.  Probably better clinical success will be obtained from  encouraging weight loss and assessing for nasal  congestion.      CDY/MEDQ  D:  07/21/2004 12:01:17  T:  07/22/2004 12:03:15  Job:  [id number]

## 2011-02-28 NOTE — Procedures (Signed)
Catherine [name], Catherine [name]              ACCOUNT NO.:  [id number]   MEDICAL RECORD NO.:  [id number]          PATIENT TYPE:  OUT   LOCATION:  SLEEP CENTER                 [facility]ACILITY:  [facility]   PHYSICIAN:  Clinton D. Maple Hudson, M.D. DATE OF BIRTH:  December 08, 1980   DATE OF STUDY:  05/11/2006                              NOCTURNAL POLYSOMNOGRAM   REFERRING PHYSICIAN:  Dr. Nadyne Coombes.   INDICATION FOR STUDY:  Hypersomnia with sleep apnea.   EPWORTH SLEEPINESS SCORE:  Is 22/24.  BMI 58.4.  Weight 386 pounds.   MEDICATIONS:  Zoloft, Lamictal, Seroquel, propranolol diuretic.   PREVIOUS SLEEP STUDIES:  On July 17, 2004, recording an AHI of 6.9 per  hour and December 10, 2005, recording an AHI of 13.7 per hour.  CPAP  titration is now requested.  Note, that the patient describes frequent naps  during the day suggesting poor sleep hygiene and frequent bathroom trips at  night.   SLEEP ARCHITECTURE:  Short total sleep time 294 minutes with sleep  efficiency 82%.  Stage I was 10%, stage II 39%, stages III and IV 15%, REM  36% of total sleep time.  Sleep latency 12 minutes, REM latency 32 minutes,  awake after sleep onset 52 minutes, arousal index 12.8.  No bedtime  medication was taken.   RESPIRATORY DATA:  CPAP titration protocol.  CPAP was titrated successfully  to a 11 CWP, AHI 3.5 per hour.  A medium ResMed Mirage liberty oral/nasal  mask was used with small nasal pillows and a heated humidifier.   OXYGEN DATA:  Snoring was prevented by CPAP with oxygen saturation on CPAP  holding at 98% on room air.   CARDIAC DATA:  Normal sinus rhythm.   MOVEMENT-PARASOMNIA:  Occasional limb jerk, insignificant.   IMPRESSIONS-RECOMMENDATIONS:  1.  Successful CPAP titration to 11 CWP, AHI 3.5 per hour.  A ResMed Mirage      liberty oral/nasal mask was used with small nasal pillows and heated      humidifier.  2.  Prior diagnostic NPSGs on July 17, 2004, recording an AHI of 6.9 and      December 10, 2005,  recording an AHI of 13.7 were noted.      Clinton D. Maple Hudson, M.D.  Diplomate, Biomedical engineer of Sleep Medicine  Electronically Signed     CDY/MEDQ  D:  05/16/2006 11:17:48  T:  05/16/2006 12:50:26  Job:  [id number]

## 2011-07-10 LAB — WET PREP, GENITAL
Clue Cells Wet Prep HPF POC: NONE SEEN
Trich, Wet Prep: NONE SEEN
WBC, Wet Prep HPF POC: NONE SEEN
Yeast Wet Prep HPF POC: NONE SEEN

## 2011-07-10 LAB — POCT PREGNANCY, URINE
Operator id: 125841
Operator id: 247071
Preg Test, Ur: NEGATIVE
Preg Test, Ur: NEGATIVE

## 2011-07-10 LAB — CBC
HCT: 36.2
Hemoglobin: 12.5
MCHC: 34.5
MCV: 89.6
Platelets: 240
RBC: 4.04
RDW: 13.4
WBC: 13.4 — ABNORMAL HIGH

## 2011-07-10 LAB — POCT I-STAT, CHEM 8
BUN: 11
Calcium, Ion: 1.18
Chloride: 103
Creatinine, Ser: 1
Glucose, Bld: 82
HCT: 42
Hemoglobin: 14.3
Potassium: 3.9
Sodium: 139
TCO2: 27

## 2011-07-10 LAB — URINALYSIS, ROUTINE W REFLEX MICROSCOPIC
Bilirubin Urine: NEGATIVE
Glucose, UA: NEGATIVE
Ketones, ur: NEGATIVE
Leukocytes, UA: NEGATIVE
Nitrite: NEGATIVE
Protein, ur: NEGATIVE
Specific Gravity, Urine: >1.03 — ABNORMAL HIGH
Urobilinogen, UA: 0.2
pH: 5.5

## 2011-07-10 LAB — POCT URINALYSIS DIP (DEVICE)
Glucose, UA: 100 — AB
Nitrite: POSITIVE — AB
Operator id: 247071
Protein, ur: 100 — AB
Specific Gravity, Urine: 1.025
Urobilinogen, UA: 1
pH: 5

## 2011-07-10 LAB — URINE MICROSCOPIC-ADD ON

## 2011-07-10 LAB — GC/CHLAMYDIA PROBE AMP, GENITAL
Chlamydia, DNA Probe: NEGATIVE
GC Probe Amp, Genital: NEGATIVE

## 2011-09-07 ENCOUNTER — Emergency Department (HOSPITAL_COMMUNITY): Payer: Self-pay

## 2011-09-07 ENCOUNTER — Encounter: Payer: Self-pay | Admitting: *Deleted

## 2011-09-07 ENCOUNTER — Other Ambulatory Visit: Payer: Self-pay

## 2011-09-07 ENCOUNTER — Emergency Department (HOSPITAL_COMMUNITY)
Admission: EM | Admit: 2011-09-07 | Discharge: 2011-09-07 | Disposition: A | Discharge status: Home / Self Care | Payer: Self-pay | Attending: Emergency Medicine | Admitting: Emergency Medicine

## 2011-09-07 DIAGNOSIS — M940 Chondrocostal junction syndrome [Tietze]: Secondary | ICD-10-CM | POA: Insufficient documentation

## 2011-09-07 DIAGNOSIS — J45909 Unspecified asthma, uncomplicated: Secondary | ICD-10-CM | POA: Insufficient documentation

## 2011-09-07 DIAGNOSIS — R079 Chest pain, unspecified: Secondary | ICD-10-CM | POA: Insufficient documentation

## 2011-09-07 MED ORDER — IBUPROFEN 600 MG PO TABS: 600.0000 mg | ORAL_TABLET | Freq: Once | ORAL | Status: AC

## 2011-09-07 MED ORDER — AZITHROMYCIN 250 MG PO TABS: ORAL_TABLET | ORAL | Status: DC

## 2011-09-07 MED ORDER — IBUPROFEN 600 MG PO TABS: 600.0000 mg | ORAL_TABLET | Freq: Four times a day (QID) | ORAL | Status: DC | PRN

## 2011-09-07 MED ORDER — IBUPROFEN 200 MG PO TABS
600.0000 mg | ORAL_TABLET | Freq: Once | ORAL | Status: AC
  Administered 2011-09-07: 600 mg via ORAL
  Filled 2011-09-07: qty 3

## 2011-09-07 MED ORDER — IBUPROFEN 200 MG PO TABS
ORAL_TABLET | ORAL | Status: AC
  Filled 2011-09-07: qty 3

## 2011-09-07 NOTE — ED Notes (Signed)
Pt reports chest pain that began on Thursday - pt states she though it was "gas pains" - hurts worse w/ inspiration.

## 2011-09-07 NOTE — ED Notes (Signed)
D/c instructions reviewed w/ pt and family - pt and family deny any further questions or concerns at present. Rx given x2  

## 2011-09-07 NOTE — ED Provider Notes (Signed)
History     CSN: [id number] Arrival date & time: 09/07/2011  3:25 AM   First MD Initiated Contact with Patient 09/07/11 [phone]      No chief complaint on file.   (Consider location/radiation/quality/duration/timing/severity/associated sxs/prior treatment) HPI Patient presents with two-day history of chest pain.  Pain is in the right chest.  It is worse when she lifts or moves her arms.  Also worse when she takes deep breath.  Patient denies shortness of breath or diaphoresis.  Patient denies history of DVT or pulmonary embolism.  Patient has past history of asthma but states this is not feel the same. No past medical history on file.  No past surgical history on file.  No family history on file.  History  Substance Use Topics  . Smoking status: Not on file  . Smokeless tobacco: Not on file  . Alcohol Use: Not on file    OB History    No data available      Review of Systems  Allergies  Review of patient's allergies indicates no known allergies.  Home Medications   Current Outpatient Rx  Name Route Sig Dispense Refill  . ASPIRIN-ACETAMINOPHEN-CAFFEINE 250-250-65 MG PO TABS Oral Take 1 tablet by mouth every 6 (six) hours as needed. For headache     . B-COMPLEX PO Oral Take 1 tablet by mouth daily.      Marland Kitchen CETIRIZINE HCL 10 MG PO TABS Oral Take 10 mg by mouth daily.      . FUROSEMIDE 40 MG PO TABS Oral Take 40 mg by mouth 2 (two) times daily.      . IBUPROFEN 200 MG PO TABS Oral Take 200 mg by mouth every 6 (six) hours as needed.      . MELOXICAM 7.5 MG PO TABS Oral Take 7.5 mg by mouth 2 (two) times daily.      Marland Kitchen OMEPRAZOLE 40 MG PO CPDR Oral Take 40 mg by mouth daily.      Marland Kitchen PHENTERMINE HCL 37.5 MG PO CAPS Oral Take 37.5 mg by mouth every morning.        BP 137/85  Temp(Src) 98.6 F (37 C) (Oral)  Resp 22  SpO2 100%  Physical Exam  Nursing note and vitals reviewed. Constitutional: She is oriented to person, place, and time. Vital signs are normal. She appears  well-developed and well-nourished. No distress.  HENT:  Head: Normocephalic and atraumatic.  Eyes: Pupils are equal, round, and reactive to light.  Neck: Normal range of motion.  Cardiovascular: Normal rate and intact distal pulses.          Date: 09/07/2011  Rate: 99  Rhythm: normal sinus rhythm with PACs  QRS Axis: normal  Intervals: normal  ST/T Wave abnormalities: normal  Conduction Disutrbances:none:   Old EKG Reviewed: changes noted: PACs are new     Pulmonary/Chest: Breath sounds normal. No respiratory distress.    Abdominal: Soft. Normal appearance. She exhibits no distension.  Musculoskeletal: Normal range of motion.  Neurological: She is alert and oriented to person, place, and time. No cranial nerve deficit.  Skin: Skin is warm and dry. No rash noted.  Psychiatric: She has a normal mood and affect. Her behavior is normal.    ED Course  Procedures (including critical care time)  Labs Reviewed - No data to display No results found.   Diagnosis 1 costochondritis    MDM          Nelia Shi, MD 09/07/11 [phone]

## 2013-04-27 ENCOUNTER — Encounter: Payer: Self-pay | Admitting: Family Medicine

## 2013-04-27 ENCOUNTER — Ambulatory Visit: Payer: BC Managed Care – PPO | Attending: Family Medicine | Admitting: Family Medicine

## 2013-04-27 VITALS — BP 139/84 | HR 90 | Temp 98.5°F | Resp 18 | Ht 66.0 in | Wt >= 6400 oz

## 2013-04-27 DIAGNOSIS — G4730 Sleep apnea, unspecified: Secondary | ICD-10-CM

## 2013-04-27 DIAGNOSIS — G4733 Obstructive sleep apnea (adult) (pediatric): Secondary | ICD-10-CM

## 2013-04-27 DIAGNOSIS — R609 Edema, unspecified: Secondary | ICD-10-CM

## 2013-04-27 DIAGNOSIS — E669 Obesity, unspecified: Secondary | ICD-10-CM

## 2013-04-27 DIAGNOSIS — J45909 Unspecified asthma, uncomplicated: Secondary | ICD-10-CM

## 2013-04-27 DIAGNOSIS — M25569 Pain in unspecified knee: Secondary | ICD-10-CM

## 2013-04-27 DIAGNOSIS — G8929 Other chronic pain: Secondary | ICD-10-CM

## 2013-04-27 DIAGNOSIS — S99929A Unspecified injury of unspecified foot, initial encounter: Secondary | ICD-10-CM

## 2013-04-27 DIAGNOSIS — K219 Gastro-esophageal reflux disease without esophagitis: Secondary | ICD-10-CM

## 2013-04-27 DIAGNOSIS — S8990XA Unspecified injury of unspecified lower leg, initial encounter: Secondary | ICD-10-CM

## 2013-04-27 MED ORDER — FUROSEMIDE 40 MG PO TABS: 40.0000 mg | ORAL_TABLET | Freq: Every day | ORAL | Status: DC

## 2013-04-27 MED ORDER — POTASSIUM CHLORIDE ER 10 MEQ PO TBCR: 10.0000 meq | EXTENDED_RELEASE_TABLET | Freq: Every day | ORAL | Status: DC

## 2013-04-27 MED ORDER — CETIRIZINE HCL 10 MG PO TABS: 10.0000 mg | ORAL_TABLET | Freq: Every day | ORAL | Status: DC

## 2013-04-27 MED ORDER — HYDROCODONE-ACETAMINOPHEN 5-325 MG PO TABS: 1.0000 | ORAL_TABLET | Freq: Two times a day (BID) | ORAL | Status: DC | PRN

## 2013-04-27 MED ORDER — MELOXICAM 7.5 MG PO TABS: 7.5000 mg | ORAL_TABLET | Freq: Two times a day (BID) | ORAL | Status: DC

## 2013-04-27 NOTE — Progress Notes (Signed)
Patient ID: Catherine [name], female   DOB: 25-Mar-1981, 32 y.o.   MRN: [id number]  CC: establish  HPI: Pt is presenting as a new patient to center. She had been going to [facility] and to [facility].  They had been prescribing phentermine for her to help her lose weight for the last year.  She has lost some weight on the medication but not nearly enough.  She has stable controlled asthma.  She also has GERD, migraines and history of depression.   She needs to be evaluated for sleep apnea.   No Known Allergies Past Medical History  Diagnosis Date  . Asthma   . Gastric ulcer   . Migraine   . Depression   . GERD (gastroesophageal reflux disease)    Current Outpatient Prescriptions on File Prior to Visit  Medication Sig Dispense Refill  . aspirin-acetaminophen-caffeine (EXCEDRIN MIGRAINE) 250-250-65 MG per tablet Take 1 tablet by mouth every 6 (six) hours as needed. For headache       . B Complex-Biotin-FA (B-COMPLEX PO) Take 1 tablet by mouth daily.        Marland Kitchen omeprazole (PRILOSEC) 40 MG capsule Take 40 mg by mouth daily.        Marland Kitchen azithromycin (ZITHROMAX Z-PAK) 250 MG tablet Take 2 tablets the first day then take 1 tablet daily until gone.  6 tablet  0   No current facility-administered medications on file prior to visit.   History reviewed. No pertinent family history. History   Social History  . Marital Status: Single    Spouse Name: N/A    Number of Children: 3  . Years of Education: college    Occupational History  . developmental tech    Social History Main Topics  . Smoking status: Former Smoker -- 0.50 packs/day    Quit date: 03/02/2013  . Smokeless tobacco: Not on file  . Alcohol Use: No  . Drug Use: No  . Sexually Active: Not on file   Other Topics Concern  . Not on file   Social History Narrative  . No narrative on file    Review of Systems  Constitutional: Negative for fever, chills, diaphoresis, activity change, appetite change and fatigue.  HENT:  Negative for ear pain, nosebleeds, congestion, facial swelling, rhinorrhea, neck pain, neck stiffness and ear discharge.   Eyes: Negative for pain, discharge, redness, itching and visual disturbance.  Respiratory: Negative for cough, choking, chest tightness, shortness of breath, wheezing and stridor.   Cardiovascular: Negative for chest pain, palpitations and leg swelling.  Gastrointestinal: Negative for abdominal distention.  Genitourinary: Negative for dysuria, urgency, frequency, hematuria, flank pain, decreased urine volume, difficulty urinating and dyspareunia.  Musculoskeletal: chronic back pain, joint swelling, arthralgias and gait problems related to weight.  Neurological: Negative for dizziness, tremors, seizures, syncope, facial asymmetry, speech difficulty, weakness, light-headedness, numbness and headaches.  Hematological: Negative for adenopathy. Does not bruise/bleed easily.  Psychiatric/Behavioral: Negative for hallucinations, behavioral problems, confusion, dysphoric mood, decreased concentration and agitation.    Objective:   Filed Vitals:   04/27/13 1254  BP: 139/84  Pulse: 90  Temp: 98.5 F (36.9 C)  Resp: 18    Physical Exam  Constitutional: Morbidly obese female.  Appears well-developed and well-nourished. No distress.  HENT: Normocephalic. External right and left ear normal. Oropharynx is clear and moist.  Eyes: Conjunctivae and EOM are normal. PERRLA, no scleral icterus.  Neck: Normal ROM. Neck supple. No JVD. No tracheal deviation. No thyromegaly.  CVS: RRR, S1/S2 +, no  murmurs, no gallops, no carotid bruit.  Pulmonary: Effort and breath sounds normal, no stridor, rhonchi, wheezes, rales.  Abdominal: Soft. BS +,  no distension, tenderness, rebound or guarding.  Musculoskeletal: Normal range of motion. Bilateral dependent LE edema and tenderness in knees  .  Lymphadenopathy: No lymphadenopathy noted, cervical, inguinal. Neuro: Alert. Normal reflexes, muscle  tone coordination. No cranial nerve deficit. Skin: Skin is warm and dry. No rash noted. Not diaphoretic. No erythema. No pallor.  Psychiatric: Normal mood and affect. Behavior, judgment, thought content normal.   Lab Results  Component Value Date   WBC 13.4* 04/15/2008   HGB 14.6 06/19/2010   HCT 43.0 06/19/2010   MCV 89.6 04/15/2008   PLT 240 04/15/2008   Lab Results  Component Value Date   CREATININE 0.9 06/19/2010   BUN 9 06/19/2010   NA 141 06/19/2010   K 3.8 06/19/2010   CL 104 06/19/2010   CO2 26 02/01/2009    No results found for this basename: HGBA1C   Lipid Panel  No results found for this basename: chol, trig, hdl, cholhdl, vldl, ldlcalc       Assessment and plan:   Patient Active Problem List   Diagnosis Date Noted  . KNEE INJURY, LEFT 01/01/2009  . ONYCHOMYCOSIS, TOENAILS 11/22/2008  . TINEA PEDIS 11/22/2008  . BIPOLAR DISORDER UNSPECIFIED 11/22/2008  . FOOT PAIN, RIGHT 11/22/2008  . DEPENDENT EDEMA, LEGS, BILATERAL 11/22/2008  . CELLULITIS AND ABSCESS OF LEG EXCEPT FOOT 12/30/2007  . OBESITY, NOS 12/10/2006  . Depressive Disorder, not Elsewhere Classified 12/10/2006  . MIGRAINE, UNSPEC., W/O INTRACTABLE MIGRAINE 12/10/2006  . ASTHMA, UNSPECIFIED 12/10/2006  . GASTROESOPHAGEAL REFLUX, NO ESOPHAGITIS 12/10/2006  . APNEA, SLEEP 12/10/2006       Encouraged pt to get gastric bypass and to start the process by researching, calling her insurance provider, etc. This likely is going to be her best chance to improve her overall health and chances for longterm survival. The patient verbalized understanding.       Referred for sleep study to be evaluated and treated for sleep apnea.      Follow up in 3 months    The patient was given clear instructions to go to ER or return to medical center if symptoms don't improve, worsen or new problems develop.  The patient verbalized understanding.  The patient was told to call to get any lab results if not heard anything in the next week.     Rodney Langton, MD, CDE, FAAFP [facility] [place], [place]

## 2013-04-27 NOTE — Patient Instructions (Addendum)
Knee Pain  The knee is the complex joint between your thigh and your lower leg. It is made up of bones, tendons, ligaments, and cartilage. The bones that make up the knee are:   The femur in the thigh.   The tibia and fibula in the lower leg.   The patella or kneecap riding in the groove on the lower femur.  CAUSES   Knee pain is a common complaint with many causes. A few of these causes are:   Injury, such as:   A ruptured ligament or tendon injury.   Torn cartilage.   Medical conditions, such as:   Gout   Arthritis   Infections   Overuse, over training or overdoing a physical activity.  Knee pain can be minor or severe. Knee pain can accompany debilitating injury. Minor knee problems often respond well to self-care measures or get well on their own. More serious injuries may need medical intervention or even surgery.  SYMPTOMS  The knee is complex. Symptoms of knee problems can vary widely. Some of the problems are:   Pain with movement and weight bearing.   Swelling and tenderness.   Buckling of the knee.   Inability to straighten or extend your knee.   Your knee locks and you cannot straighten it.   Warmth and redness with pain and fever.   Deformity or dislocation of the kneecap.  DIAGNOSIS   Determining what is wrong may be very straight forward such as when there is an injury. It can also be challenging because of the complexity of the knee. Tests to make a diagnosis may include:   Your caregiver taking a history and doing a physical exam.   Routine X-rays can be used to rule out other problems. X-rays will not reveal a cartilage tear. Some injuries of the knee can be diagnosed by:   Arthroscopy a surgical technique by which a small video camera is inserted through tiny incisions on the sides of the knee. This procedure is used to examine and repair internal knee joint problems. Tiny instruments can be used during arthroscopy to repair the torn knee cartilage (meniscus).   Arthrography  is a radiology technique. A contrast liquid is directly injected into the knee joint. Internal structures of the knee joint then become visible on X-ray film.   An MRI scan is a non x-ray radiology procedure in which magnetic fields and a computer produce two- or three-dimensional images of the inside of the knee. Cartilage tears are often visible using an MRI scanner. MRI scans have largely replaced arthrography in diagnosing cartilage tears of the knee.   Blood work.   Examination of the fluid that helps to lubricate the knee joint (synovial fluid). This is done by taking a sample out using a needle and a syringe.  TREATMENT  The treatment of knee problems depends on the cause. Some of these treatments are:   Depending on the injury, proper casting, splinting, surgery or physical therapy care will be needed.   Give yourself adequate recovery time. Do not overuse your joints. If you begin to get sore during workout routines, back off. Slow down or do fewer repetitions.   For repetitive activities such as cycling or running, maintain your strength and nutrition.   Alternate muscle groups. For example if you are a weight lifter, work the upper body on one day and the lower body the next.   Either tight or weak muscles do not give the proper support for your   knee. Tight or weak muscles do not absorb the stress placed on the knee joint. Keep the muscles surrounding the knee strong.   Take care of mechanical problems.   If you have flat feet, orthotics or special shoes may help. See your caregiver if you need help.   Arch supports, sometimes with wedges on the inner or outer aspect of the heel, can help. These can shift pressure away from the side of the knee most bothered by osteoarthritis.   A brace called an "unloader" brace also may be used to help ease the pressure on the most arthritic side of the knee.   If your caregiver has prescribed crutches, braces, wraps or ice, use as directed. The acronym for  this is PRICE. This means protection, rest, ice, compression and elevation.   Nonsteroidal anti-inflammatory drugs (NSAID's), can help relieve pain. But if taken immediately after an injury, they may actually increase swelling. Take NSAID's with food in your stomach. Stop them if you develop stomach problems. Do not take these if you have a history of ulcers, stomach pain or bleeding from the bowel. Do not take without your caregiver's approval if you have problems with fluid retention, heart failure, or kidney problems.   For ongoing knee problems, physical therapy may be helpful.   Glucosamine and chondroitin are over-the-counter dietary supplements. Both may help relieve the pain of osteoarthritis in the knee. These medicines are different from the usual anti-inflammatory drugs. Glucosamine may decrease the rate of cartilage destruction.   Injections of a corticosteroid drug into your knee joint may help reduce the symptoms of an arthritis flare-up. They may provide pain relief that lasts a few months. You may have to wait a few months between injections. The injections do have a small increased risk of infection, water retention and elevated blood sugar levels.   Hyaluronic acid injected into damaged joints may ease pain and provide lubrication. These injections may work by reducing inflammation. A series of shots may give relief for as long as 6 months.   Topical painkillers. Applying certain ointments to your skin may help relieve the pain and stiffness of osteoarthritis. Ask your pharmacist for suggestions. Many over the-counter products are approved for temporary relief of arthritis pain.   In some countries, doctors often prescribe topical NSAID's for relief of chronic conditions such as arthritis and tendinitis. A review of treatment with NSAID creams found that they worked as well as oral medications but without the serious side effects.  PREVENTION   Maintain a healthy weight. Extra pounds put  more strain on your joints.   Get strong, stay limber. Weak muscles are a common cause of knee injuries. Stretching is important. Include flexibility exercises in your workouts.   Be smart about exercise. If you have osteoarthritis, chronic knee pain or recurring injuries, you may need to change the way you exercise. This does not mean you have to stop being active. If your knees ache after jogging or playing basketball, consider switching to swimming, water aerobics or other low-impact activities, at least for a few days a week. Sometimes limiting high-impact activities will provide relief.   Make sure your shoes fit well. Choose footwear that is right for your sport.   Protect your knees. Use the proper gear for knee-sensitive activities. Use kneepads when playing volleyball or laying carpet. Buckle your seat belt every time you drive. Most shattered kneecaps occur in car accidents.   Rest when you are tired.  SEEK MEDICAL CARE IF:     You have knee pain that is continual and does not seem to be getting better.   SEEK IMMEDIATE MEDICAL CARE IF:   Your knee joint feels hot to the touch and you have a high fever.  MAKE SURE YOU:    Understand these instructions.   Will watch your condition.   Will get help right away if you are not doing well or get worse.  Document Released: 07/27/2007 Document Revised: 12/22/2011 Document Reviewed: 07/27/2007  [employer] Patient Information 2014 [employer].

## 2013-04-27 NOTE — Progress Notes (Signed)
04/27/13 Present to establish care. P.Safire Gordin,RN

## 2013-05-02 ENCOUNTER — Encounter: Payer: Self-pay | Admitting: Family Medicine

## 2013-05-02 ENCOUNTER — Ambulatory Visit: Payer: BC Managed Care – PPO | Admitting: Family Medicine

## 2013-05-02 ENCOUNTER — Ambulatory Visit (INDEPENDENT_AMBULATORY_CARE_PROVIDER_SITE_OTHER): Payer: BC Managed Care – PPO | Admitting: Family Medicine

## 2013-05-02 VITALS — BP 145/91 | HR 79 | Ht 67.0 in | Wt >= 6400 oz

## 2013-05-02 DIAGNOSIS — S8990XA Unspecified injury of unspecified lower leg, initial encounter: Secondary | ICD-10-CM

## 2013-05-02 DIAGNOSIS — S99929A Unspecified injury of unspecified foot, initial encounter: Secondary | ICD-10-CM

## 2013-05-02 NOTE — Patient Instructions (Addendum)
You have a partial ACL tear - usually this by itself doesn't cause pain but the muscle inhibition and weakness that goes along with it does. You also have pes bursitis. We will start by treating both with physical therapy. Do home exercises on days you don't go to therapy for the next 6 weeks. Follow up with me at that time. Continue meloxicam, vicodin as you have been. Icing 15 minutes at a time 3-4 times a day. We will get in touch with the [facility] representative who may contact you directly about a hinged knee brace.

## 2013-05-03 ENCOUNTER — Encounter: Payer: Self-pay | Admitting: Family Medicine

## 2013-05-03 NOTE — Assessment & Plan Note (Signed)
Known partial ACL tear of this knee but typically this does not cause persistent pain.  She never did PT following this (told by surgeon at that time she wasn't a surgical candidate and regardless I would recommend rehabilitating a partial tear).  She would like to start with PT muscle weakness and pes bursitis.  Continue meloxicam and vicodin.  F/u in 6 weeks.  Consider pes injection.  Will also have a brace rep contact her for measurement - she's interested in trying a custom brace as off the shelf (and in office) ones have not fit her.

## 2013-05-03 NOTE — Progress Notes (Signed)
Patient ID: Catherine [name], female   DOB: 12/03/1980, 32 y.o.   MRN: [id number]  PCP: Standley Dakins, MD  Subjective:   HPI: Patient is a 32 y.o. female here for left knee pain.  Patient reports back in 2010 she was moving furniture off a uhaul when she stepped sideways, felt to side and left leg bent backwards. States she's had problems with left knee swelling and pain ever since this. She had a complete workup including x-rays (told she had arthritis but per records this was minimal back in 2011) and MRI (showed partial ACL tear, pes bursitis, bone bruising). Has not done PT before - advised she couldn't have surgery because she was too obese at the time. She has tried meloxicam with vicodin bid prn. Feels like knee slips at times. Gets stiff with immobilization (if sitting for more than an hour). Worse by end of day.  Past Medical History  Diagnosis Date  . Asthma   . Gastric ulcer   . Migraine   . Depression   . GERD (gastroesophageal reflux disease)     Current Outpatient Prescriptions on File Prior to Visit  Medication Sig Dispense Refill  . ALPRAZolam (XANAX) 0.5 MG tablet Take 0.5 mg by mouth at bedtime as needed for sleep.      Marland Kitchen aspirin-acetaminophen-caffeine (EXCEDRIN MIGRAINE) 250-250-65 MG per tablet Take 1 tablet by mouth every 6 (six) hours as needed. For headache       . cetirizine (ZYRTEC) 10 MG tablet Take 1 tablet (10 mg total) by mouth daily.  30 tablet  3  . furosemide (LASIX) 40 MG tablet Take 1 tablet (40 mg total) by mouth daily.  30 tablet  2  . HYDROcodone-acetaminophen (NORCO/VICODIN) 5-325 MG per tablet Take 1 tablet by mouth 2 (two) times daily as needed for pain.  40 tablet  0  . meloxicam (MOBIC) 7.5 MG tablet Take 1 tablet (7.5 mg total) by mouth 2 (two) times daily.  60 tablet  2  . omeprazole (PRILOSEC) 40 MG capsule Take 40 mg by mouth daily.        . potassium chloride (K-DUR) 10 MEQ tablet Take 1 tablet (10 mEq total) by mouth daily.  30  tablet  2  . B Complex-Biotin-FA (B-COMPLEX PO) Take 1 tablet by mouth daily.         No current facility-administered medications on file prior to visit.    History reviewed. No pertinent past surgical history.  No Known Allergies  History   Social History  . Marital Status: Single    Spouse Name: N/A    Number of Children: 3  . Years of Education: college    Occupational History  . developmental tech    Social History Main Topics  . Smoking status: Former Smoker -- 0.50 packs/day    Quit date: 03/02/2013  . Smokeless tobacco: Not on file  . Alcohol Use: No  . Drug Use: No  . Sexually Active: Not on file   Other Topics Concern  . Not on file   Social History Narrative  . No narrative on file    Family History  Problem Relation Age of Onset  . Hypertension Mother   . Hyperlipidemia Mother   . Sudden death Father   . Hypertension Father   . Sudden death Brother   . Hypertension Brother   . Hyperlipidemia Brother   . Heart attack Brother   . Diabetes Neg Hx     BP 145/91  Pulse  79  Ht 5\' 7"  (1.702 m)  Wt 432 lb 3.2 oz (196.045 kg)  BMI 67.68 kg/m2  Review of Systems: See HPI above.    Objective:  Physical Exam:  Gen: NAD, obese  Left knee -  No gross deformity, ecchymoses, effusion though difficult to discern given habitus. TTP medial > lateral joint lines, post patellar facets.  Pain pes bursa area as well. ROM 0 - 100 degrees. 1+ ant drawer.  Negative post drawer.  Negative valgus/varus testing. Negative lachmanns. Pain throughout with mcmurrays, apleys.  Negative patellar apprehension. NV intact distally.    Assessment & Plan:  1. Left knee pain - Known partial ACL tear of this knee but typically this does not cause persistent pain.  She never did PT following this (told by surgeon at that time she wasn't a surgical candidate and regardless I would recommend rehabilitating a partial tear).  She would like to start with PT muscle weakness and  pes bursitis.  Continue meloxicam and vicodin.  F/u in 6 weeks.  Consider pes injection.  Will also have a brace rep contact her for measurement - she's interested in trying a custom brace as off the shelf (and in office) ones have not fit her.

## 2013-05-06 ENCOUNTER — Encounter: Payer: Self-pay | Admitting: Internal Medicine

## 2013-05-06 ENCOUNTER — Ambulatory Visit: Payer: BC Managed Care – PPO | Attending: Family Medicine | Admitting: Internal Medicine

## 2013-05-06 VITALS — BP 149/86 | HR 72 | Temp 98.3°F | Resp 18 | Ht 67.0 in | Wt >= 6400 oz

## 2013-05-06 DIAGNOSIS — J329 Chronic sinusitis, unspecified: Secondary | ICD-10-CM

## 2013-05-06 DIAGNOSIS — H6690 Otitis media, unspecified, unspecified ear: Secondary | ICD-10-CM

## 2013-05-06 DIAGNOSIS — H6692 Otitis media, unspecified, left ear: Secondary | ICD-10-CM

## 2013-05-06 DIAGNOSIS — J019 Acute sinusitis, unspecified: Secondary | ICD-10-CM | POA: Insufficient documentation

## 2013-05-06 MED ORDER — AMOXICILLIN-POT CLAVULANATE 875-125 MG PO TABS: 1.0000 | ORAL_TABLET | Freq: Two times a day (BID) | ORAL | Status: DC

## 2013-05-06 MED ORDER — ANTIPYRINE-BENZOCAINE 5.4-1.4 % OT SOLN: 3.0000 [drp] | OTIC | Status: DC | PRN

## 2013-05-06 NOTE — Progress Notes (Signed)
PT HERE FOR F/U SINUSITIS. C/O LEFT EAR PAIN WIT DRAINAGE TO THROAT. MEDS NOT EFFECTIVE. STATES SHE NEEDS A RETURN TO WORK STATING SHE CAN WORK WITH PRESCRIBED MEDICATION ZYRTEC/MELOXICAM. AFEBRILE.

## 2013-05-06 NOTE — Progress Notes (Signed)
Patient ID: Catherine [name], female   DOB: Feb 15, 1981, 32 y.o.   MRN: [id number] Patient Demographics  Catherine [name], is a 32 y.o. female  NWG:[id number]  [id number]:[id number]  DOB - 09/19/1981  Chief Complaint  Patient presents with  . Sinusitis    LEFT EAR PAIN WITH DRAINAGE,SORETHROAT  . Letter for School/Work        Subjective:   Catherine [name] today is here for a follow up visit.   Patient is having acute sinusitis with left ear pain the last 2 days. No chest pain, productive cough. She has a history of recurrent ear infections since childhood.  Objective:    Filed Vitals:   05/06/13 1129  BP: 149/86  Pulse: 72  Temp: 98.3 F (36.8 C)  TempSrc: Oral  Resp: 18  Height: 5\' 7"  (1.702 m)  Weight: 417 lb (189.15 kg)  SpO2: 100%     ALLERGIES:  No Known Allergies  PAST MEDICAL HISTORY: Past Medical History  Diagnosis Date  . Asthma   . Gastric ulcer   . Migraine   . Depression   . GERD (gastroesophageal reflux disease)     MEDICATIONS AT HOME: Prior to Admission medications   Medication Sig Start Date End Date Taking? Authorizing Provider  ALPRAZolam Prudy Feeler) 0.5 MG tablet Take 0.5 mg by mouth at bedtime as needed for sleep.   Yes Historical Provider, MD  aspirin-acetaminophen-caffeine (EXCEDRIN MIGRAINE) [phone] MG per tablet Take 1 tablet by mouth every 6 (six) hours as needed. For headache    Yes Historical Provider, MD  B Complex-Biotin-FA (B-COMPLEX PO) Take 1 tablet by mouth daily.     Yes Historical Provider, MD  cetirizine (ZYRTEC) 10 MG tablet Take 1 tablet (10 mg total) by mouth daily. 04/27/13  Yes Clanford Cyndie Mull, MD  furosemide (LASIX) 40 MG tablet Take 1 tablet (40 mg total) by mouth daily. 04/27/13  Yes Clanford Cyndie Mull, MD  HYDROcodone-acetaminophen (NORCO/VICODIN) 5-325 MG per tablet Take 1 tablet by mouth 2 (two) times daily as needed for pain. 04/27/13  Yes Clanford Cyndie Mull, MD  meloxicam (MOBIC) 7.5 MG tablet Take 1 tablet (7.5 mg total)  by mouth 2 (two) times daily. 04/27/13  Yes Clanford Cyndie Mull, MD  omeprazole (PRILOSEC) 40 MG capsule Take 40 mg by mouth daily.     Yes Historical Provider, MD  potassium chloride (K-DUR) 10 MEQ tablet Take 1 tablet (10 mEq total) by mouth daily. 04/27/13  Yes Clanford Cyndie Mull, MD  amoxicillin-clavulanate (AUGMENTIN) 875-125 MG per tablet Take 1 tablet by mouth 2 (two) times daily. X 10days 05/06/13   Anberlin Diez Jenna Luo, MD  antipyrine-benzocaine Lyla Son) otic solution Place 3 drops into the left ear every 2 (two) hours as needed for pain. 05/06/13   Memorie Yokoyama Jenna Luo, MD     Exam  General appearance :Awake, alert, NAD, Speech Clear.  HEENT: Atraumatic and Normocephalic, PERLA. Left ear boggy with drainage. Right ear normal Neck: supple, no JVD. No cervical lymphadenopathy.  Chest: Clear to auscultation bilaterally, no wheezing, rales or rhonchi CVS: S1 S2 regular, no murmurs.  Abdomen: soft, NBS, NT, ND, no gaurding, rigidity or rebound. Extremities: no cyanosis or clubbing, B/L Lower Ext shows no edema Neurology: Awake alert, and oriented X 3, CN II-XII intact, Non focal Skin: No Rash or lesions Wounds:N/A    Data Review   Basic Metabolic Panel: No results found for this basename: NA, K, CL, CO2, GLUCOSE, BUN, CREATININE, CALCIUM, MG, PHOS,  in the last 168 hours  Liver Function Tests: No results found for this basename: AST, ALT, ALKPHOS, BILITOT, PROT, ALBUMIN,  in the last 168 hours  CBC: No results found for this basename: WBC, NEUTROABS, HGB, HCT, MCV, PLT,  in the last 168 hours  ------------------------------------------------------------------------------------------------------------------ No results found for this basename: HGBA1C,  in the last 72 hours ------------------------------------------------------------------------------------------------------------------ No results found for this basename: CHOL, HDL, LDLCALC, TRIG, CHOLHDL, LDLDIRECT,  in the last 72  hours ------------------------------------------------------------------------------------------------------------------ No results found for this basename: TSH, T4TOTAL, FREET3, T3FREE, THYROIDAB,  in the last 72 hours ------------------------------------------------------------------------------------------------------------------ No results found for this basename: VITAMINB12, FOLATE, FERRITIN, TIBC, IRON, RETICCTPCT,  in the last 72 hours  Coagulation profile  No results found for this basename: INR, PROTIME,  in the last 168 hours    Assessment & Plan   Active Problems: Acute sinusitis - Placed on Augmentin x 10 days, auralgan prn for ear pain - Continue cetrizine daily  Recommendations: patient in followup in one week if her symptoms are not improved. She'll need to call the clinic to make an appointment otherwise routine followup  in 3 months     Sarahlynn Cisnero M.D. 05/06/2013, 12:12 PM

## 2013-05-30 ENCOUNTER — Telehealth: Payer: Self-pay | Admitting: Family Medicine

## 2013-05-30 NOTE — Telephone Encounter (Signed)
Pt called in to get a refill on medications; pt is completely out of medications; pt does not know if she needs to be seen before hand

## 2013-06-01 ENCOUNTER — Ambulatory Visit (HOSPITAL_BASED_OUTPATIENT_CLINIC_OR_DEPARTMENT_OTHER): Payer: BC Managed Care – PPO | Attending: Family Medicine | Admitting: Radiology

## 2013-06-01 VITALS — Ht 67.0 in | Wt >= 6400 oz

## 2013-06-01 DIAGNOSIS — G4733 Obstructive sleep apnea (adult) (pediatric): Secondary | ICD-10-CM | POA: Insufficient documentation

## 2013-06-03 ENCOUNTER — Emergency Department (HOSPITAL_COMMUNITY)
Admission: EM | Admit: 2013-06-03 | Discharge: 2013-06-03 | Disposition: A | Discharge status: Home / Self Care | Payer: BC Managed Care – PPO | Source: Home / Self Care | Attending: Emergency Medicine | Admitting: Emergency Medicine

## 2013-06-03 ENCOUNTER — Telehealth: Payer: Self-pay | Admitting: Internal Medicine

## 2013-06-03 ENCOUNTER — Encounter (HOSPITAL_COMMUNITY): Payer: Self-pay

## 2013-06-03 DIAGNOSIS — M25561 Pain in right knee: Secondary | ICD-10-CM

## 2013-06-03 DIAGNOSIS — M25562 Pain in left knee: Secondary | ICD-10-CM

## 2013-06-03 DIAGNOSIS — M25569 Pain in unspecified knee: Secondary | ICD-10-CM

## 2013-06-03 MED ORDER — HYDROCODONE-ACETAMINOPHEN 5-325 MG PO TABS: 1.0000 | ORAL_TABLET | Freq: Two times a day (BID) | ORAL | Status: DC | PRN

## 2013-06-03 NOTE — ED Notes (Signed)
Reportedly on long term vicodin BID after all; states she did not get her usual Rx from Dr Laural Benes (did not write for quantity sufficient to cover BID x 30 days has an appointment to see MD at clinic next month; NAD

## 2013-06-03 NOTE — ED Provider Notes (Signed)
CSN: [id number]     Arrival date & time 06/03/13  1534 History     First MD Initiated Contact with Patient 06/03/13 1716     Chief Complaint  Patient presents with  . Medication Refill   (Consider location/radiation/quality/duration/timing/severity/associated sxs/prior Treatment) HPI Patient has chronic bursitis of knees. She is on Vicodin and Meloxicam for her pain. She is out of her Vicodin but able to get refills on her Meloxicam. Last fill was in July. Last dose of Vicodin was 5 days ago, no refills. No new injury, no redness or swelling. Followed by Ortho in [facility] as well, planning on going to PT soon. Unable to get into her PCP office until Sept 19. No new or concerning symptoms  Past Medical History  Diagnosis Date  . Asthma   . Gastric ulcer   . Migraine   . Depression   . GERD (gastroesophageal reflux disease)    History reviewed. No pertinent past surgical history. Family History  Problem Relation Age of Onset  . Hypertension Mother   . Hyperlipidemia Mother   . Sudden death Father   . Hypertension Father   . Sudden death Brother   . Hypertension Brother   . Hyperlipidemia Brother   . Heart attack Brother   . Diabetes Neg Hx    History  Substance Use Topics  . Smoking status: Former Smoker -- 0.50 packs/day    Quit date: 03/02/2013  . Smokeless tobacco: Not on file  . Alcohol Use: No   OB History   Grav Para Term Preterm Abortions TAB SAB Ect Mult Living                 Review of Systems  Constitutional: Negative for fever and chills.  HENT: Negative for congestion.   Eyes: Negative for visual disturbance.  Respiratory: Negative for cough and shortness of breath.   Cardiovascular: Negative for chest pain and leg swelling.  Gastrointestinal: Negative for abdominal pain.  Genitourinary: Negative for dysuria.  Musculoskeletal: Positive for myalgias, arthralgias and gait problem. Negative for joint swelling.  Skin: Negative for rash.  Neurological:  Negative for headaches.    Allergies  Review of patient's allergies indicates no known allergies.  Home Medications   Current Outpatient Rx  Name  Route  Sig  Dispense  Refill  . ALPRAZolam (XANAX) 0.5 MG tablet   Oral   Take 0.5 mg by mouth at bedtime as needed for sleep.         Marland Kitchen aspirin-acetaminophen-caffeine (EXCEDRIN MIGRAINE) 250-250-65 MG per tablet   Oral   Take 1 tablet by mouth every 6 (six) hours as needed. For headache          . B Complex-Biotin-FA (B-COMPLEX PO)   Oral   Take 1 tablet by mouth daily.           . cetirizine (ZYRTEC) 10 MG tablet   Oral   Take 1 tablet (10 mg total) by mouth daily.   30 tablet   3   . furosemide (LASIX) 40 MG tablet   Oral   Take 1 tablet (40 mg total) by mouth daily.   30 tablet   2   . omeprazole (PRILOSEC) 40 MG capsule   Oral   Take 40 mg by mouth daily.           . potassium chloride (K-DUR) 10 MEQ tablet   Oral   Take 1 tablet (10 mEq total) by mouth daily.   30 tablet  2   . amoxicillin-clavulanate (AUGMENTIN) 875-125 MG per tablet   Oral   Take 1 tablet by mouth 2 (two) times daily. X 10days   20 tablet   0   . antipyrine-benzocaine (AURALGAN) otic solution   Left Ear   Place 3 drops into the left ear every 2 (two) hours as needed for pain.   15 mL   2   . HYDROcodone-acetaminophen (NORCO/VICODIN) 5-325 MG per tablet   Oral   Take 1 tablet by mouth 2 (two) times daily as needed for pain.   40 tablet   0   . HYDROcodone-acetaminophen (NORCO/VICODIN) 5-325 MG per tablet   Oral   Take 1 tablet by mouth 2 (two) times daily as needed for pain.   20 tablet   0   . meloxicam (MOBIC) 7.5 MG tablet   Oral   Take 1 tablet (7.5 mg total) by mouth 2 (two) times daily.   60 tablet   2    BP 157/100  Pulse 68  Temp(Src) 98.2 F (36.8 C) (Oral)  Resp 20  SpO2 99% Physical Exam  Constitutional: She is oriented to person, place, and time. She appears well-developed and well-nourished. No  distress.  Obese  HENT:  Head: Atraumatic.  Mouth/Throat: Oropharynx is clear and moist.  Neck: Normal range of motion. Neck supple.  Cardiovascular: Normal rate and regular rhythm.   No murmur heard. Pulmonary/Chest: Effort normal and breath sounds normal. She has no wheezes.  Abdominal: Soft. There is no tenderness.  Musculoskeletal: Normal range of motion.  Large legs with TTP of medial joint line of both knees. Unable to appreciate any effusion. No redness. Gait is altered due to pain.  Lymphadenopathy:    She has no cervical adenopathy.  Neurological: She is alert and oriented to person, place, and time.  Skin: Skin is warm and dry.    ED Course   Procedures (including critical care time)  Labs Reviewed - No data to display No results found. 1. Bilateral knee pain    MDM  32 yo obese female with chronic knee pain bilaterally.  Last refill on Vicodin was July 16 for #40. Given #20 and advised to make them last as long as possible. I do not see any acute indication for imaging or further work up today. She will follow up with [facility] to further discuss options for treatment. Continue exercising and using ice on knees. Patient agrees with plan.   Hilarie Fredrickson, MD 06/03/13 [id number]

## 2013-06-03 NOTE — ED Provider Notes (Signed)
Medical screening examination/treatment/procedure(s) were performed by a resident physician and as supervising physician I was immediately available for consultation/collaboration.  Seab Axel, M.D.  Edeline Greening C Navjot Loera, MD 06/03/13 2222 

## 2013-06-03 NOTE — Telephone Encounter (Signed)
Pt calling again about HYDROcodone-acetaminophen (NORCO/VICODIN) 5-325 MG per tablet refill, says needs enough until 06/28/13.

## 2013-06-05 DIAGNOSIS — R0989 Other specified symptoms and signs involving the circulatory and respiratory systems: Secondary | ICD-10-CM

## 2013-06-05 DIAGNOSIS — R0609 Other forms of dyspnea: Secondary | ICD-10-CM

## 2013-06-05 DIAGNOSIS — G4733 Obstructive sleep apnea (adult) (pediatric): Secondary | ICD-10-CM

## 2013-06-05 NOTE — Procedures (Signed)
[name], [name]              ACCOUNT NO.:  [id number]  MEDICAL RECORD NO.:  [id number]          PATIENT TYPE:  OUT  LOCATION:  SLEEP CENTER                 FACILITY:  [facility]  PHYSICIAN:  Ronnett Pullin D. Maple Hudson, MD, FCCP, FACPDATE OF BIRTH:  01/07/1982  DATE OF STUDY:  06/01/2013                           NOCTURNAL POLYSOMNOGRAM  REFERRING PHYSICIAN:  Standley Dakins, MD  REFERRING PHYSICIAN:  Standley Dakins, MD  INDICATION FOR STUDY:  Hypersomnia with sleep apnea.  EPWORTH SLEEPINESS SCORE:  16/24, BMI 66.9, weight 427 pounds, height 67 inches, neck 17 inches.  HOME MEDICATIONS:  Charted for review.  SLEEP ARCHITECTURE:  Split study protocol.  During the diagnostic phase, total sleep time 125 minutes with sleep efficiency 84.5%.  Stage I was 3.6%, stage II 80.8%, stage III absent, REM 15.6% of total sleep time. Sleep latency 14 minutes, REM latency 64 minutes.  Awake after sleep onset 4 minutes.  Arousal index 6.7.  Bedtime medication:  None.  RESPIRATORY DATA:  Split study protocol.  Apnea/hypopnea index (AHI) 20.6 per hour.  A total of 43 events was scored, all as hypopneas and mostly associated with supine sleep position.  REM AHI 76.9 per hour. CPAP was titrated to 9 CWP, AHI 0 per hour.  She wore a medium ResMed Mirage Quattro full-face mask with heated humidifier.  OXYGEN DATA:  Loud snoring before CPAP with oxygen desaturation to a nadir of 84% on room air.  With CPAP control, snoring was prevented and mean oxygen saturation held 97.1% on room air.  CARDIAC DATA:  Sinus rhythm with PACs and PVCs including some ventricular bigeminy.  MOVEMENT/PARASOMNIA:  No significant movement disturbance.  No bathroom trips.  IMPRESSION/RECOMMENDATION: 1. No potential for sleep disturbance because the patient normally     works 3rd shift. 2. Moderate obstructive sleep apnea/hypopnea syndrome, AHI of 20.6 per     hour with events more common while supine.  Loud snoring with   oxygen desaturation to a nadir of 84% on room air. 3. Successful CPAP titration to 9 CWP, AHI 0 per hour.  She wore a     medium ResMed Mirage Quattro full-face     mask with heated humidifier.  Snoring was prevented and mean oxygen     saturation held 97.1% on room air with CPAP.     [name] D. Maple Hudson, MD, [facility], FACP Diplomate, American Board of Sleep Medicine    CDY/MEDQ  D:  06/05/2013 12:15:00  T:  06/05/2013 12:33:32  Job:  [id number]

## 2013-06-14 ENCOUNTER — Encounter: Payer: Self-pay | Admitting: Family Medicine

## 2013-06-14 ENCOUNTER — Ambulatory Visit (INDEPENDENT_AMBULATORY_CARE_PROVIDER_SITE_OTHER): Payer: BC Managed Care – PPO | Admitting: Family Medicine

## 2013-06-14 VITALS — BP 138/86 | HR 98 | Ht 67.0 in | Wt >= 6400 oz

## 2013-06-14 DIAGNOSIS — M25569 Pain in unspecified knee: Secondary | ICD-10-CM

## 2013-06-14 DIAGNOSIS — M25561 Pain in right knee: Secondary | ICD-10-CM

## 2013-06-14 DIAGNOSIS — S8990XA Unspecified injury of unspecified lower leg, initial encounter: Secondary | ICD-10-CM

## 2013-06-14 DIAGNOSIS — M25562 Pain in left knee: Secondary | ICD-10-CM

## 2013-06-14 NOTE — Patient Instructions (Addendum)
Start physical therapy for both of your knees. Continue using the knee brace. Meloxicam daily with food for pain and inflammation. Follow up with me in 6 weeks or as needed.

## 2013-06-15 ENCOUNTER — Encounter: Payer: Self-pay | Admitting: Family Medicine

## 2013-06-15 NOTE — Progress Notes (Signed)
Patient ID: Catherine [name], female   DOB: [birth year], 32 y.o.   MRN: [id number]  PCP: Jeanann Lewandowsky, MD  Subjective:   HPI: Patient is a 32 y.o. female here for f/u left knee pain.  7/21: Patient reports back in 2010 she was moving furniture off a uhaul when she stepped sideways, felt to side and left leg bent backwards. States she's had problems with left knee swelling and pain ever since this. She had a complete workup including x-rays (told she had arthritis but per records this was minimal back in 2011) and MRI (showed partial ACL tear, pes bursitis, bone bruising). Has not done PT before - advised she couldn't have surgery because she was too obese at the time. She has tried meloxicam with vicodin bid prn. Feels like knee slips at times. Gets stiff with immobilization (if sitting for more than an hour). Worse by end of day.  9/2: Patient reports she has improved with knee brace, mobic, icing. Some right knee pain now medially as well. Never got a phone call about starting therapy. No catching, locking. No other changes.  Past Medical History  Diagnosis Date  . Asthma   . Gastric ulcer   . Migraine   . Depression   . GERD (gastroesophageal reflux disease)     Current Outpatient Prescriptions on File Prior to Visit  Medication Sig Dispense Refill  . ALPRAZolam (XANAX) 0.5 MG tablet Take 0.5 mg by mouth at bedtime as needed for sleep.      Marland Kitchen aspirin-acetaminophen-caffeine (EXCEDRIN MIGRAINE) 250-250-65 MG per tablet Take 1 tablet by mouth every 6 (six) hours as needed. For headache       . B Complex-Biotin-FA (B-COMPLEX PO) Take 1 tablet by mouth daily.        . cetirizine (ZYRTEC) 10 MG tablet Take 1 tablet (10 mg total) by mouth daily.  30 tablet  3  . furosemide (LASIX) 40 MG tablet Take 1 tablet (40 mg total) by mouth daily.  30 tablet  2  . HYDROcodone-acetaminophen (NORCO/VICODIN) 5-325 MG per tablet Take 1 tablet by mouth 2 (two) times daily as needed for pain.   20 tablet  0  . meloxicam (MOBIC) 7.5 MG tablet Take 1 tablet (7.5 mg total) by mouth 2 (two) times daily.  60 tablet  2  . omeprazole (PRILOSEC) 40 MG capsule Take 40 mg by mouth daily.        . potassium chloride (K-DUR) 10 MEQ tablet Take 1 tablet (10 mEq total) by mouth daily.  30 tablet  2   No current facility-administered medications on file prior to visit.    History reviewed. No pertinent past surgical history.  No Known Allergies  History   Social History  . Marital Status: Single    Spouse Name: N/A    Number of Children: 3  . Years of Education: college    Occupational History  . developmental tech    Social History Main Topics  . Smoking status: Former Smoker -- 0.50 packs/day    Quit date: 03/02/2013  . Smokeless tobacco: Not on file  . Alcohol Use: No  . Drug Use: No  . Sexual Activity: Not on file   Other Topics Concern  . Not on file   Social History Narrative  . No narrative on file    Family History  Problem Relation Age of Onset  . Hypertension Mother   . Hyperlipidemia Mother   . Sudden death Father   . Hypertension Father   .  Sudden death Brother   . Hypertension Brother   . Hyperlipidemia Brother   . Heart attack Brother   . Diabetes Neg Hx     BP 138/86  Pulse 98  Ht 5\' 7"  (1.702 m)  Wt 417 lb (189.15 kg)  BMI 65.3 kg/m2  Review of Systems: See HPI above.    Objective:  Physical Exam:  Gen: NAD, obese  Left knee:  No gross deformity, ecchymoses, effusion though difficult to discern given habitus. TTP medial joint line.  No TTP pes bursa. ROM 0 - 100 degrees. 1+ ant drawer.  Negative post drawer.  Negative valgus/varus testing. Negative lachmanns. Pain throughout with mcmurrays, apleys.  Negative patellar apprehension. NV intact distally.    Assessment & Plan:  1. Left knee pain - Known partial ACL tear of this knee but typically this does not cause persistent pain.  Improved with nsaids, icing, bracing since last  visit.  Will add PT - never was called about this though was part of plan last visit.  Should continue to improve with this.  F/u in 6 weeks or prn.

## 2013-06-15 NOTE — Assessment & Plan Note (Signed)
Known partial ACL tear of this knee but typically this does not cause persistent pain.  Improved with nsaids, icing, bracing since last visit.  Will add PT - never was called about this though was part of plan last visit.  Should continue to improve with this.  F/u in 6 weeks or prn.

## 2013-06-28 ENCOUNTER — Ambulatory Visit: Payer: BC Managed Care – PPO | Attending: Family Medicine | Admitting: Internal Medicine

## 2013-06-28 VITALS — BP 144/85 | HR 80 | Temp 98.2°F | Resp 15 | Wt >= 6400 oz

## 2013-06-28 DIAGNOSIS — G4733 Obstructive sleep apnea (adult) (pediatric): Secondary | ICD-10-CM

## 2013-06-28 NOTE — Patient Instructions (Signed)

## 2013-06-28 NOTE — Progress Notes (Signed)
Patient ID: Catherine [name], female   DOB: March 31, 1981, 32 y.o.   MRN: [id number]  CC: Followup on sleep study  HPI: Patient is 32 year old female, morbidly obese with BMI over 50, presents to clinic to discuss results of sleep study. She reports no specific concerns at today's visit, denies chest pain or shortness of breath, no specific abdominal or urinary concerns. She denies recent sicknesses or hospitalizations. She is working on losing weight and explains it has been somewhat difficult.  No Known Allergies Past Medical History  Diagnosis Date  . Asthma   . Gastric ulcer   . Migraine   . Depression   . GERD (gastroesophageal reflux disease)    Current Outpatient Prescriptions on File Prior to Visit  Medication Sig Dispense Refill  . ALPRAZolam (XANAX) 0.5 MG tablet Take 0.5 mg by mouth at bedtime as needed for sleep.      Marland Kitchen aspirin-acetaminophen-caffeine (EXCEDRIN MIGRAINE) 250-250-65 MG per tablet Take 1 tablet by mouth every 6 (six) hours as needed. For headache       . B Complex-Biotin-FA (B-COMPLEX PO) Take 1 tablet by mouth daily.        . cetirizine (ZYRTEC) 10 MG tablet Take 1 tablet (10 mg total) by mouth daily.  30 tablet  3  . furosemide (LASIX) 40 MG tablet Take 1 tablet (40 mg total) by mouth daily.  30 tablet  2  . HYDROcodone-acetaminophen (NORCO/VICODIN) 5-325 MG per tablet Take 1 tablet by mouth 2 (two) times daily as needed for pain.  20 tablet  0  . meloxicam (MOBIC) 7.5 MG tablet Take 1 tablet (7.5 mg total) by mouth 2 (two) times daily.  60 tablet  2  . omeprazole (PRILOSEC) 40 MG capsule Take 40 mg by mouth daily.        . potassium chloride (K-DUR) 10 MEQ tablet Take 1 tablet (10 mEq total) by mouth daily.  30 tablet  2   No current facility-administered medications on file prior to visit.   Family History  Problem Relation Age of Onset  . Hypertension Mother   . Hyperlipidemia Mother   . Sudden death Father   . Hypertension Father   . Sudden death Brother    . Hypertension Brother   . Hyperlipidemia Brother   . Heart attack Brother   . Diabetes Neg Hx    History   Social History  . Marital Status: Single    Spouse Name: N/A    Number of Children: 3  . Years of Education: college    Occupational History  . developmental tech    Social History Main Topics  . Smoking status: Former Smoker -- 0.50 packs/day    Quit date: 03/02/2013  . Smokeless tobacco: Not on file  . Alcohol Use: No  . Drug Use: No  . Sexual Activity: Not on file   Other Topics Concern  . Not on file   Social History Narrative  . No narrative on file    Review of Systems  Constitutional: Negative for fever, chills, diaphoresis, activity change, appetite change and fatigue.  HENT: Negative for ear pain, nosebleeds, congestion, facial swelling, rhinorrhea, neck pain, neck stiffness and ear discharge.   Eyes: Negative for pain, discharge, redness, itching and visual disturbance.  Respiratory: Negative for cough, choking, chest tightness, shortness of breath, wheezing and stridor.   Cardiovascular: Negative for chest pain, palpitations and leg swelling.  Gastrointestinal: Negative for abdominal distention.  Genitourinary: Negative for dysuria, urgency, frequency, hematuria, flank pain,  decreased urine volume, difficulty urinating and dyspareunia.  Musculoskeletal: Negative for back pain, joint swelling, arthralgias and gait problem.  Neurological: Negative for dizziness, tremors, seizures, syncope, facial asymmetry, speech difficulty, weakness, light-headedness, numbness and headaches.  Hematological: Negative for adenopathy. Does not bruise/bleed easily.  Psychiatric/Behavioral: Negative for hallucinations, behavioral problems, confusion, dysphoric mood, decreased concentration and agitation.    Objective:   Filed Vitals:   06/28/13 1013  BP: 144/85  Pulse: 80  Temp: 98.2 F (36.8 C)  Resp: 15    Physical Exam  Constitutional: Appears stable and in no  distress, morbidly obese CVS: RRR, S1/S2 +, no murmurs, no gallops, no carotid bruit.  Pulmonary: Diminished breath sounds bilaterally and mostly due to body habitus Abdominal: Soft. BS +,  no distension, tenderness, rebound or guarding.  Psychiatric: Normal mood and affect. Behavior, judgment, thought content normal.   Lab Results  Component Value Date   WBC 13.4* 04/15/2008   HGB 14.6 06/19/2010   HCT 43.0 06/19/2010   MCV 89.6 04/15/2008   PLT 240 04/15/2008   Lab Results  Component Value Date   CREATININE 0.9 06/19/2010   BUN 9 06/19/2010   NA 141 06/19/2010   K 3.8 06/19/2010   CL 104 06/19/2010   CO2 26 02/01/2009    No results found for this basename: HGBA1C   Lipid Panel  No results found for this basename: chol, trig, hdl, cholhdl, vldl, ldlcalc       Assessment and plan:   Patient Active Problem List   Diagnosis Date Noted  . OBESITY, NOS - we have discussed monitoring calorie intake, importance of daily exercises and potential referral to surgery, bariatric surgery for consideration of gastric BiPAP  12/10/2006  . APNEA, SLEEP - moderate sleep apnea on sleep study. Oxygen saturations dropped to 84% on room air during the sleep and have improved to 97% on CPAP. Patient will need CPAP and we cannot provided here in the clinic. Will provide referral to pulmonary specialist for further evaluation and management of sleep apnea  12/10/2006

## 2013-06-28 NOTE — Progress Notes (Signed)
Patient here for follow up on her sleep study Has gotten her knee brace and starts physical therapy on the 22 of this month

## 2013-07-04 ENCOUNTER — Ambulatory Visit: Payer: BC Managed Care – PPO | Attending: Family Medicine | Admitting: Physical Therapy

## 2013-07-04 ENCOUNTER — Other Ambulatory Visit: Payer: Self-pay | Admitting: Family Medicine

## 2013-07-04 DIAGNOSIS — M25569 Pain in unspecified knee: Secondary | ICD-10-CM | POA: Insufficient documentation

## 2013-07-04 DIAGNOSIS — R269 Unspecified abnormalities of gait and mobility: Secondary | ICD-10-CM | POA: Insufficient documentation

## 2013-07-04 DIAGNOSIS — IMO0001 Reserved for inherently not codable concepts without codable children: Secondary | ICD-10-CM | POA: Insufficient documentation

## 2013-07-06 ENCOUNTER — Encounter: Payer: Self-pay | Admitting: Family Medicine

## 2013-07-06 ENCOUNTER — Ambulatory Visit: Payer: BC Managed Care – PPO | Admitting: Physical Therapy

## 2013-07-06 NOTE — Telephone Encounter (Signed)
Pt was seen on 06/28/13.

## 2013-07-11 ENCOUNTER — Ambulatory Visit: Payer: BC Managed Care – PPO | Admitting: Physical Therapy

## 2013-07-13 ENCOUNTER — Ambulatory Visit: Payer: BC Managed Care – PPO | Attending: Family Medicine | Admitting: Physical Therapy

## 2013-07-13 DIAGNOSIS — R269 Unspecified abnormalities of gait and mobility: Secondary | ICD-10-CM | POA: Insufficient documentation

## 2013-07-13 DIAGNOSIS — IMO0001 Reserved for inherently not codable concepts without codable children: Secondary | ICD-10-CM | POA: Insufficient documentation

## 2013-07-13 DIAGNOSIS — M25569 Pain in unspecified knee: Secondary | ICD-10-CM | POA: Insufficient documentation

## 2013-07-18 ENCOUNTER — Ambulatory Visit: Payer: BC Managed Care – PPO | Admitting: Physical Therapy

## 2013-07-20 ENCOUNTER — Ambulatory Visit: Payer: BC Managed Care – PPO | Admitting: Physical Therapy

## 2013-07-21 ENCOUNTER — Ambulatory Visit: Payer: BC Managed Care – PPO | Admitting: Rehabilitation

## 2013-07-25 ENCOUNTER — Ambulatory Visit: Payer: BC Managed Care – PPO | Admitting: Physical Therapy

## 2013-07-26 ENCOUNTER — Ambulatory Visit: Payer: BC Managed Care – PPO | Admitting: Physical Therapy

## 2013-07-28 ENCOUNTER — Encounter: Payer: BC Managed Care – PPO | Admitting: Physical Therapy

## 2013-07-29 ENCOUNTER — Encounter: Payer: Self-pay | Admitting: Pulmonary Disease

## 2013-07-29 ENCOUNTER — Ambulatory Visit (INDEPENDENT_AMBULATORY_CARE_PROVIDER_SITE_OTHER): Payer: BC Managed Care – PPO | Admitting: Pulmonary Disease

## 2013-07-29 VITALS — BP 138/96 | HR 88 | Temp 98.0°F | Ht 67.0 in | Wt >= 6400 oz

## 2013-07-29 DIAGNOSIS — G4733 Obstructive sleep apnea (adult) (pediatric): Secondary | ICD-10-CM

## 2013-07-29 NOTE — Patient Instructions (Signed)
Will start on cpap.  Please call if having issues with tolerance Work on weight loss followup with me in 6 weeks

## 2013-07-29 NOTE — Assessment & Plan Note (Signed)
The patient has moderate obstructive sleep apnea by her recent sleep test, but has significant nighttime and daytime symptoms.  I have had long discussion with her about the pathophysiology of sleep apnea, including its impact to her quality of life and cardiovascular health.  I think CPAP while working on weight loss for her best treatments, and she is willing to try this. I will set the patient up on cpap at a moderate pressure level to allow for desensitization, and will troubleshoot the device over the next 4-6weeks if needed.  The pt is to call me if having issues with tolerance.  Will then optimize the pressure once patient is able to wear cpap on a consistent basis.

## 2013-07-29 NOTE — Progress Notes (Signed)
Subjective:    Patient ID: Catherine [name], female    DOB: 07-Oct-1981, 32 y.o.   MRN: [id number]  HPI The patient is a 32 year old female who I've been asked to see for obstructive sleep apnea.  She had a sleep study recently which showed an AHI of 21 per hour, and responded nicely to CPAP at 9 cm.  The patient has a history of loud snoring, as well as an abnormal breathing pattern during sleep.  She is not rested in the mornings upon arising, and notes significant daytime sleepiness with inactivity.  She has great difficulty staying awake for television or movies, and also had sleepiness with driving.  She tells me that her weight is up over 100 pounds over the last 2 years, and her Epworth score today is 17.   Sleep Questionnaire What time do you typically go to bed?( Between what hours) 10p-12am 10p-12am at 1122 on 07/29/13 by Maisie Fus, CMA How long does it take you to fall asleep? within a fews minutes to max within a fews minutes to max at 1122 on 07/29/13 by Maisie Fus, CMA How many times during the night do you wake up? 2 2 at 1122 on 07/29/13 by Maisie Fus, CMA What time do you get out of bed to start your day? No Value 8-9am at 1122 on 07/29/13 by Maisie Fus, CMA Do you drive or operate heavy machinery in your occupation? No No at 1122 on 07/29/13 by Maisie Fus, CMA How much has your weight changed (up or down) over the past two years? (In pounds) 100 lb (45.36 kg)100 lb (45.36 kg) increase at 1122 on 07/29/13 by Maisie Fus, CMA Have you ever had a sleep study before? Yes Yes at 1122 on 07/29/13 by Maisie Fus, CMA If yes, location of study? [facility] at 1122 on 07/29/13 by Maisie Fus, CMA If yes, date of study? 06/01/13 06/01/13 at 1122 on 07/29/13 by Maisie Fus, CMA Do you currently use CPAP? No No at 1122 on 07/29/13 by Maisie Fus, CMA Do you wear oxygen at any time? No No at 1122 on 07/29/13 by Maisie Fus,  CMA   Review of Systems  Constitutional: Positive for unexpected weight change. Negative for fever.  HENT: Positive for dental problem, sinus pressure and sneezing. Negative for congestion, ear pain, nosebleeds, postnasal drip, rhinorrhea, sore throat and trouble swallowing.   Eyes: Negative for redness and itching.  Respiratory: Negative for cough, chest tightness, shortness of breath and wheezing.   Cardiovascular: Negative for palpitations and leg swelling.  Gastrointestinal: Negative for nausea and vomiting.       Acid reflux//indigestion  Genitourinary: Negative for dysuria.  Musculoskeletal: Positive for arthralgias and joint swelling.  Skin: Negative for rash.  Neurological: Negative for headaches.  Hematological: Does not bruise/bleed easily.  Psychiatric/Behavioral: Negative for dysphoric mood. The patient is not nervous/anxious.        Objective:   Physical Exam Constitutional:  Morbidly obese female, no acute distress  HENT:  Nares patent without discharge  Oropharynx without exudate, palate and uvula are thick and elongated with narrowing posteriorly  Eyes:  Perrla, eomi, no scleral icterus  Neck:  No JVD, no TMG  Cardiovascular:  Normal rate, regular rhythm, no rubs or gallops.  No murmurs        Intact distal pulses  Pulmonary :  Normal breath sounds, no stridor or respiratory distress   No rales,  rhonchi, or wheezing  Abdominal:  Soft, nondistended, bowel sounds present.  No tenderness noted.   Musculoskeletal:  mild lower extremity edema noted.  Lymph Nodes:  No cervical lymphadenopathy noted  Skin:  No cyanosis noted  Neurologic:  Appears sleepy, but appropriate, moves all 4 extremities without obvious deficit.         Assessment & Plan:

## 2013-08-01 ENCOUNTER — Ambulatory Visit: Payer: BC Managed Care – PPO | Admitting: Physical Therapy

## 2013-08-03 ENCOUNTER — Ambulatory Visit: Payer: BC Managed Care – PPO | Admitting: Physical Therapy

## 2013-08-04 ENCOUNTER — Other Ambulatory Visit: Payer: Self-pay | Admitting: Family Medicine

## 2013-08-04 ENCOUNTER — Ambulatory Visit: Payer: BC Managed Care – PPO | Admitting: Rehabilitation

## 2013-08-04 ENCOUNTER — Encounter: Payer: BC Managed Care – PPO | Admitting: Physical Therapy

## 2013-08-09 ENCOUNTER — Ambulatory Visit: Payer: BC Managed Care – PPO | Admitting: Rehabilitation

## 2013-08-11 ENCOUNTER — Ambulatory Visit: Payer: BC Managed Care – PPO | Admitting: Rehabilitation

## 2013-08-16 ENCOUNTER — Ambulatory Visit: Payer: BC Managed Care – PPO | Attending: Family Medicine | Admitting: Physical Therapy

## 2013-08-16 DIAGNOSIS — IMO0001 Reserved for inherently not codable concepts without codable children: Secondary | ICD-10-CM | POA: Insufficient documentation

## 2013-08-16 DIAGNOSIS — R269 Unspecified abnormalities of gait and mobility: Secondary | ICD-10-CM | POA: Insufficient documentation

## 2013-08-16 DIAGNOSIS — M25569 Pain in unspecified knee: Secondary | ICD-10-CM | POA: Insufficient documentation

## 2013-08-18 ENCOUNTER — Other Ambulatory Visit: Payer: Self-pay

## 2013-08-18 ENCOUNTER — Ambulatory Visit: Payer: BC Managed Care – PPO | Admitting: Physical Therapy

## 2013-09-06 ENCOUNTER — Emergency Department (HOSPITAL_COMMUNITY)
Admission: EM | Admit: 2013-09-06 | Discharge: 2013-09-06 | Disposition: A | Discharge status: Home / Self Care | Payer: BC Managed Care – PPO | Source: Home / Self Care | Attending: Emergency Medicine | Admitting: Emergency Medicine

## 2013-09-06 ENCOUNTER — Encounter (HOSPITAL_COMMUNITY): Payer: Self-pay | Admitting: Emergency Medicine

## 2013-09-06 DIAGNOSIS — E876 Hypokalemia: Secondary | ICD-10-CM

## 2013-09-06 DIAGNOSIS — M79671 Pain in right foot: Secondary | ICD-10-CM

## 2013-09-06 DIAGNOSIS — M79609 Pain in unspecified limb: Secondary | ICD-10-CM

## 2013-09-06 DIAGNOSIS — M79672 Pain in left foot: Secondary | ICD-10-CM

## 2013-09-06 LAB — POCT I-STAT, CHEM 8
BUN: 12 mg/dL (ref 6–23)
Calcium, Ion: 1.23 mmol/L (ref 1.12–1.23)
Chloride: 105 mEq/L (ref 96–112)
Creatinine, Ser: 1.1 mg/dL (ref 0.50–1.10)
Glucose, Bld: 82 mg/dL (ref 70–99)
HCT: 38 % (ref 36.0–46.0)
Hemoglobin: 12.9 g/dL (ref 12.0–15.0)
Potassium: 3.2 mEq/L — ABNORMAL LOW (ref 3.5–5.1)
Sodium: 144 mEq/L (ref 135–145)
TCO2: 26 mmol/L (ref 0–100)

## 2013-09-06 MED ORDER — POTASSIUM CHLORIDE ER 10 MEQ PO TBCR: 10.0000 meq | EXTENDED_RELEASE_TABLET | Freq: Three times a day (TID) | ORAL | Status: DC

## 2013-09-06 MED ORDER — HYDROCODONE-ACETAMINOPHEN 5-325 MG PO TABS: 1.0000 | ORAL_TABLET | Freq: Four times a day (QID) | ORAL | Status: DC | PRN

## 2013-09-06 NOTE — ED Provider Notes (Signed)
Medical screening examination/treatment/procedure(s) were performed by non-physician practitioner and as supervising physician I was immediately available for consultation/collaboration.  Leslee Home, M.D.  Reuben Likes, MD 09/06/13 [phone]

## 2013-09-06 NOTE — ED Notes (Signed)
C/o sharp pain and numbness in both feet.  Onset 2 days ago.  Denies injury.  Pt has tried ibuprofen, meloxicam and Excedrin  With no relief in symptoms.

## 2013-09-06 NOTE — ED Provider Notes (Signed)
CSN: [id number]     Arrival date & time 09/06/13  1640 History   First MD Initiated Contact with Patient 09/06/13 1714     Chief Complaint  Patient presents with  . Foot Pain   (Consider location/radiation/quality/duration/timing/severity/associated sxs/prior Treatment) HPI Comments: 32 yo obese female presents with increasing bilateral foot numbness and pain. She denies injury. She notes symptoms worse when she is seated. She has a history of low potassium but is taking RX AD. She denies any HX of anemia, DM, GOUT. She has not taken any OTCs for pain , she notes elevating legs help somewhat.  Patient is a 32 y.o. female presenting with lower extremity pain.  Foot Pain    Past Medical History  Diagnosis Date  . Asthma   . Gastric ulcer   . Migraine   . Depression   . GERD (gastroesophageal reflux disease)    Past Surgical History  Procedure Laterality Date  . Wisdom tooth extraction  2008    x4   Family History  Problem Relation Age of Onset  . Hypertension Mother   . Hyperlipidemia Mother   . Sudden death Father   . Hypertension Father   . Sudden death Brother   . Hypertension Brother   . Hyperlipidemia Brother   . Heart attack Brother   . Diabetes Neg Hx    History  Substance Use Topics  . Smoking status: Former Smoker -- 0.50 packs/day for 15.5 years    Types: Cigarettes    Quit date: 03/02/2013  . Smokeless tobacco: Not on file  . Alcohol Use: No   OB History   Grav Para Term Preterm Abortions TAB SAB Ect Mult Living                 Review of Systems  Musculoskeletal: Positive for arthralgias and myalgias.       Bilateral feet.  All other systems reviewed and are negative.    Allergies  Review of patient's allergies indicates no known allergies.  Home Medications   Current Outpatient Rx  Name  Route  Sig  Dispense  Refill  . aspirin-acetaminophen-caffeine (EXCEDRIN MIGRAINE) 250-250-65 MG per tablet   Oral   Take 1 tablet by mouth every 6  (six) hours as needed. For headache          . B Complex-Biotin-FA (B-COMPLEX PO)   Oral   Take 1 tablet by mouth daily.           . cetirizine (ZYRTEC) 10 MG tablet   Oral   Take 1 tablet (10 mg total) by mouth daily.   30 tablet   3   . furosemide (LASIX) 40 MG tablet   Oral   Take 1 tablet (40 mg total) by mouth daily.   30 tablet   2   . meloxicam (MOBIC) 7.5 MG tablet   Oral   Take 1 tablet (7.5 mg total) by mouth 2 (two) times daily.   60 tablet   2   . omeprazole (PRILOSEC) 40 MG capsule   Oral   Take 40 mg by mouth daily.           . potassium chloride (K-DUR) 10 MEQ tablet   Oral   Take 1 tablet (10 mEq total) by mouth daily.   30 tablet   2   . ALPRAZolam (XANAX) 0.5 MG tablet   Oral   Take 0.5 mg by mouth at bedtime as needed for sleep.         Marland Kitchen  HYDROcodone-acetaminophen (NORCO) 5-325 MG per tablet   Oral   Take 1 tablet by mouth every 6 (six) hours as needed for moderate pain.   30 tablet   0    BP 140/96  Pulse 96  Temp(Src) 98 F (36.7 C) (Oral)  Resp 16  SpO2 100%  LMP 08/13/2013 Physical Exam  Nursing note and vitals reviewed. Constitutional: She is oriented to person, place, and time. She appears well-developed and well-nourished.  Obese  Neck: Normal range of motion. No thyromegaly present.  Cardiovascular: Normal rate, regular rhythm, normal heart sounds and intact distal pulses.   Pulmonary/Chest: Effort normal and breath sounds normal.  Musculoskeletal: Normal range of motion. She exhibits edema. She exhibits no tenderness.  + mild bilateral LE nonpitting  Lymphadenopathy:    She has no cervical adenopathy.  Neurological: She is alert and oriented to person, place, and time. She has normal reflexes. No cranial nerve deficit. She exhibits normal muscle tone. Coordination normal.  Skin: Skin is warm and dry.  Psychiatric: She has a normal mood and affect.    ED Course  Procedures (including critical care time) Labs  Review Labs Reviewed  POCT I-STAT, CHEM 8 - Abnormal; Notable for the following:    Potassium 3.2 (*)    All other components within normal limits   Imaging Review No results found.    MDM  1. Elevated BP. Needs to recheck if stays high f/u at PCP. If any CV symptoms ER.  2. Bilateral foot pain and numbness with out history of trauma. Advised needs further workup at PCP to evaluate for DM, neuropathy, gout, Iron deficiency, B12 deficiency, and circulatory issues.. Low Potassium on lab will advise increase to TID and get rechecked 1 week at PCP.  Norco 5/325 AD #30  Berenice Primas, PA-C 09/06/13 1749

## 2013-09-12 ENCOUNTER — Encounter: Payer: Self-pay | Admitting: Pulmonary Disease

## 2013-09-12 ENCOUNTER — Ambulatory Visit (INDEPENDENT_AMBULATORY_CARE_PROVIDER_SITE_OTHER): Payer: BC Managed Care – PPO | Admitting: Pulmonary Disease

## 2013-09-12 VITALS — BP 126/68 | HR 110 | Temp 98.0°F | Ht 67.0 in | Wt >= 6400 oz

## 2013-09-12 DIAGNOSIS — G4733 Obstructive sleep apnea (adult) (pediatric): Secondary | ICD-10-CM

## 2013-09-12 NOTE — Assessment & Plan Note (Signed)
The patient seems to be doing fairly well from a sleep apnea standpoint. She clearly sees a difference in her sleep with CPAP, and is slowly adapting to it over time. She is having no issues with her mask fit, although she is pulling it off in her sleep at times. At this point, we need to optimize her pressure for her, and we'll do this on the automatic setting. I have also encouraged her to work aggressively on weight loss.

## 2013-09-12 NOTE — Patient Instructions (Signed)
Continue on cpap, and we will set your machine on the auto setting to optimize your pressure.  Will let you know the results once the download is available. Keep working on weight loss.  followup with me again in 6mos if doing well.

## 2013-09-12 NOTE — Progress Notes (Signed)
   Subjective:    Patient ID: Catherine [name], female    DOB: [birth year], 32 y.o.   MRN: [id number]  HPI The patient comes in today for followup of her obstructive sleep apnea. She was started on CPAP at the last visit, and overall has done well with the device. She feels that she definitely sleeps better when wearing the CPAP, and is slowly adapting to the device. She will pull the mask off at times during the night for unknown reasons, but this is becoming less frequent. We have yet to optimize her pressure.   Review of Systems  Constitutional: Negative for fever and unexpected weight change.  HENT: Negative for congestion, dental problem, ear pain, nosebleeds, postnasal drip, rhinorrhea, sinus pressure, sneezing, sore throat and trouble swallowing.   Eyes: Negative for redness and itching.  Respiratory: Negative for cough, chest tightness, shortness of breath and wheezing.   Cardiovascular: Negative for palpitations and leg swelling.  Gastrointestinal: Negative for nausea and vomiting.  Genitourinary: Negative for dysuria.  Musculoskeletal: Negative for joint swelling.  Skin: Negative for rash.  Neurological: Negative for headaches.  Hematological: Does not bruise/bleed easily.  Psychiatric/Behavioral: Negative for dysphoric mood. The patient is not nervous/anxious.        Objective:   Physical Exam Morbidly obese female in no acute distress Nose without purulence or discharge noted No skin breakdown or pressure necrosis from the CPAP mask Neck without lymphadenopathy or thyromegaly Lower extremities with edema noted, no cyanosis Alert and oriented, does not appear to be sleepy, moves all 4 extremities.       Assessment & Plan:

## 2013-09-21 ENCOUNTER — Ambulatory Visit: Payer: BC Managed Care – PPO | Attending: Internal Medicine | Admitting: Internal Medicine

## 2013-09-21 ENCOUNTER — Encounter: Payer: Self-pay | Admitting: Internal Medicine

## 2013-09-21 VITALS — BP 161/90 | HR 98 | Temp 98.7°F | Resp 14 | Ht 67.0 in | Wt >= 6400 oz

## 2013-09-21 DIAGNOSIS — M79609 Pain in unspecified limb: Secondary | ICD-10-CM

## 2013-09-21 DIAGNOSIS — M79671 Pain in right foot: Secondary | ICD-10-CM

## 2013-09-21 DIAGNOSIS — M79672 Pain in left foot: Secondary | ICD-10-CM

## 2013-09-21 DIAGNOSIS — E876 Hypokalemia: Secondary | ICD-10-CM | POA: Insufficient documentation

## 2013-09-21 LAB — CBC WITH DIFFERENTIAL/PLATELET
Basophils Absolute: 0 10*3/uL (ref 0.0–0.1)
Basophils Relative: 0 % (ref 0–1)
Eosinophils Absolute: 0.1 10*3/uL (ref 0.0–0.7)
Eosinophils Relative: 1 % (ref 0–5)
HCT: 40.1 % (ref 36.0–46.0)
Hemoglobin: 13.3 g/dL (ref 12.0–15.0)
Lymphocytes Relative: 34 % (ref 12–46)
Lymphs Abs: 4.3 10*3/uL — ABNORMAL HIGH (ref 0.7–4.0)
MCH: 28.4 pg (ref 26.0–34.0)
MCHC: 33.2 g/dL (ref 30.0–36.0)
MCV: 85.5 fL (ref 78.0–100.0)
Monocytes Absolute: 1.3 10*3/uL — ABNORMAL HIGH (ref 0.1–1.0)
Monocytes Relative: 10 % (ref 3–12)
Neutro Abs: 6.9 10*3/uL (ref 1.7–7.7)
Neutrophils Relative %: 55 % (ref 43–77)
Platelets: 322 10*3/uL (ref 150–400)
RBC: 4.69 MIL/uL (ref 3.87–5.11)
RDW: 14.6 % (ref 11.5–15.5)
WBC: 12.6 10*3/uL — ABNORMAL HIGH (ref 4.0–10.5)

## 2013-09-21 LAB — COMPLETE METABOLIC PANEL WITH GFR
ALT: 25 U/L (ref 0–35)
AST: 28 U/L (ref 0–37)
Albumin: 4.2 g/dL (ref 3.5–5.2)
Alkaline Phosphatase: 58 U/L (ref 39–117)
BUN: 21 mg/dL (ref 6–23)
CO2: 26 mEq/L (ref 19–32)
Calcium: 9.6 mg/dL (ref 8.4–10.5)
Chloride: 103 mEq/L (ref 96–112)
Creat: 0.93 mg/dL (ref 0.50–1.10)
GFR, Est African American: >89 mL/min
GFR, Est Non African American: 82 mL/min
Glucose, Bld: 77 mg/dL (ref 70–99)
Potassium: 4.7 mEq/L (ref 3.5–5.3)
Sodium: 139 mEq/L (ref 135–145)
Total Bilirubin: 0.3 mg/dL (ref 0.3–1.2)
Total Protein: 7.6 g/dL (ref 6.0–8.3)

## 2013-09-21 LAB — LIPID PANEL
Cholesterol: 154 mg/dL (ref 0–200)
HDL: 41 mg/dL (ref >39)
LDL Cholesterol: 101 mg/dL — ABNORMAL HIGH (ref 0–99)
Total CHOL/HDL Ratio: 3.8 Ratio
Triglycerides: 61 mg/dL (ref <150)
VLDL: 12 mg/dL (ref 0–40)

## 2013-09-21 LAB — URIC ACID: Uric Acid, Serum: 7.2 mg/dL — ABNORMAL HIGH (ref 2.4–7.0)

## 2013-09-21 MED ORDER — TRAMADOL HCL 50 MG PO TABS: 50.0000 mg | ORAL_TABLET | Freq: Three times a day (TID) | ORAL | Status: DC | PRN

## 2013-09-21 NOTE — Progress Notes (Signed)
Pt is here for a f/u visit for bilateral foot pain x2 weeks. Taking medication for pain. Potassium may be low.

## 2013-09-21 NOTE — Progress Notes (Signed)
Patient ID: Catherine [name], female   DOB: 1981/07/30, 32 y.o.   MRN: [id number]   CC:  HPI: 32 year old morbidly obese female with a BMI of 50 who presents with bilateral foot pain. She has recently seen Dr.Shane Aris Lot, MD in sports medicine clinic in [facility]. The patient has a known partial anterior cruciate ligament tear of the right knee. She was also seen at urgent care 2 weeks ago for bilateral foot pain. She describes the pain being present in both her feet from the base of her toe to insertion of  plantar fascia. The patient has a history of pes planus. She states that she has an appointment on December 23 for orthotics for arch support. She will be receiving an arch support. In the meantime urgent care prescribed her Vicodin. Explain the pain policy of the clinic and she is agreeable to receiving tramadol Weight loss is strongly recommended      No Known Allergies Past Medical History  Diagnosis Date  . Asthma   . Gastric ulcer   . Migraine   . Depression   . GERD (gastroesophageal reflux disease)    Current Outpatient Prescriptions on File Prior to Visit  Medication Sig Dispense Refill  . ALPRAZolam (XANAX) 0.5 MG tablet Take 0.5 mg by mouth at bedtime as needed for sleep.      Marland Kitchen aspirin-acetaminophen-caffeine (EXCEDRIN MIGRAINE) 250-250-65 MG per tablet Take 1 tablet by mouth every 6 (six) hours as needed. For headache       . B Complex-Biotin-FA (B-COMPLEX PO) Take 1 tablet by mouth daily.        . cetirizine (ZYRTEC) 10 MG tablet Take 1 tablet (10 mg total) by mouth daily.  30 tablet  3  . furosemide (LASIX) 40 MG tablet Take 1 tablet (40 mg total) by mouth daily.  30 tablet  2  . HYDROcodone-acetaminophen (NORCO) 5-325 MG per tablet Take 1 tablet by mouth every 6 (six) hours as needed for moderate pain.  30 tablet  0  . meloxicam (MOBIC) 7.5 MG tablet Take 1 tablet (7.5 mg total) by mouth 2 (two) times daily.  60 tablet  2  . omeprazole (PRILOSEC) 40 MG capsule  Take 40 mg by mouth daily.        . potassium chloride (K-DUR) 10 MEQ tablet Take 1 tablet (10 mEq total) by mouth 3 (three) times daily.  90 tablet  0   No current facility-administered medications on file prior to visit.   Family History  Problem Relation Age of Onset  . Hypertension Mother   . Hyperlipidemia Mother   . Sudden death Father   . Hypertension Father   . Sudden death Brother   . Hypertension Brother   . Hyperlipidemia Brother   . Heart attack Brother   . Diabetes Neg Hx    History   Social History  . Marital Status: Single    Spouse Name: N/A    Number of Children: 3  . Years of Education: college    Occupational History  . developmental tech    Social History Main Topics  . Smoking status: Former Smoker -- 0.50 packs/day for 15.5 years    Types: Cigarettes    Quit date: 03/02/2013  . Smokeless tobacco: Not on file  . Alcohol Use: No  . Drug Use: No  . Sexual Activity: No   Other Topics Concern  . Not on file   Social History Narrative  . No narrative on file  Review of Systems  Constitutional: Negative for fever, chills, diaphoresis, activity change, appetite change and fatigue.  HENT: Negative for ear pain, nosebleeds, congestion, facial swelling, rhinorrhea, neck pain, neck stiffness and ear discharge.   Eyes: Negative for pain, discharge, redness, itching and visual disturbance.  Respiratory: Negative for cough, choking, chest tightness, shortness of breath, wheezing and stridor.   Cardiovascular: Negative for chest pain, palpitations and leg swelling.  Gastrointestinal: Negative for abdominal distention.  Genitourinary: Negative for dysuria, urgency, frequency, hematuria, flank pain, decreased urine volume, difficulty urinating and dyspareunia.  Musculoskeletal: Negative for back pain, joint swelling, arthralgias and gait problem.  Neurological: Negative for dizziness, tremors, seizures, syncope, facial asymmetry, speech difficulty, weakness,  light-headedness, numbness and headaches.  Hematological: Negative for adenopathy. Does not bruise/bleed easily.  Psychiatric/Behavioral: Negative for hallucinations, behavioral problems, confusion, dysphoric mood, decreased concentration and agitation.    Objective:   Filed Vitals:   09/21/13 1558  BP: 161/90  Pulse: 98  Temp: 98.7 F (37.1 C)  Resp: 14    Physical Exam  Constitutional: Appears well-developed and well-nourished. No distress.  HENT: Normocephalic. External right and left ear normal. Oropharynx is clear and moist.  Eyes: Conjunctivae and EOM are normal. PERRLA, no scleral icterus.  Neck: Normal ROM. Neck supple. No JVD. No tracheal deviation. No thyromegaly.  CVS: RRR, S1/S2 +, no murmurs, no gallops, no carotid bruit.  Pulmonary: Effort and breath sounds normal, no stridor, rhonchi, wheezes, rales.  Abdominal: Soft. BS +,  no distension, tenderness, rebound or guarding.  Musculoskeletal: Normal range of motion. No edema and no tenderness.  Lymphadenopathy: No lymphadenopathy noted, cervical, inguinal. Neuro: Alert. Normal reflexes, muscle tone coordination. No cranial nerve deficit. Skin: Skin is warm and dry. No rash noted. Not diaphoretic. No erythema. No pallor.  Psychiatric: Normal mood and affect. Behavior, judgment, thought content normal.   Lab Results  Component Value Date   WBC 13.4* 04/15/2008   HGB 12.9 09/06/2013   HCT 38.0 09/06/2013   MCV 89.6 04/15/2008   PLT 240 04/15/2008   Lab Results  Component Value Date   CREATININE 1.10 09/06/2013   BUN 12 09/06/2013   NA 144 09/06/2013   K 3.2* 09/06/2013   CL 105 09/06/2013   CO2 26 02/01/2009    No results found for this basename: HGBA1C   Lipid Panel  No results found for this basename: chol, trig, hdl, cholhdl, vldl, ldlcalc       Assessment and plan:   Patient Active Problem List   Diagnosis Date Noted  . KNEE INJURY, LEFT 01/01/2009  . ONYCHOMYCOSIS, TOENAILS 11/22/2008  . TINEA PEDIS  11/22/2008  . BIPOLAR DISORDER UNSPECIFIED 11/22/2008  . FOOT PAIN, RIGHT 11/22/2008  . DEPENDENT EDEMA, LEGS, BILATERAL 11/22/2008  . CELLULITIS AND ABSCESS OF LEG EXCEPT FOOT 12/30/2007  . OBESITY, NOS 12/10/2006  . Depressive Disorder, not Elsewhere Classified 12/10/2006  . MIGRAINE, UNSPEC., W/O INTRACTABLE MIGRAINE 12/10/2006  . ASTHMA, UNSPECIFIED 12/10/2006  . GASTROESOPHAGEAL REFLUX, NO ESOPHAGITIS 12/10/2006  . OSA (obstructive sleep apnea) 12/10/2006       Bilateral foot pain Previous x-ray had shown pes planus deformity Patient also has 2+ pitting edema, states that she has been taking Lasix 40 mg a day She states that she has lost some weight in the last couple of months She hasn't orthotics appointment on 12/23 Prescribed tramadol for pain Check a uric acid   Hypokalemia Will recheck potassium today She is already on potassium supplementation  Followup in one month  The patient was given clear instructions to go to ER or return to medical center if symptoms don't improve, worsen or new problems develop. The patient verbalized understanding. The patient was told to call to get any lab results if not heard anything in the next week.

## 2013-09-22 ENCOUNTER — Other Ambulatory Visit: Payer: Self-pay

## 2013-09-22 MED ORDER — TRAMADOL HCL 50 MG PO TABS: 50.0000 mg | ORAL_TABLET | Freq: Three times a day (TID) | ORAL | Status: DC | PRN

## 2013-09-23 ENCOUNTER — Telehealth: Payer: Self-pay | Admitting: Emergency Medicine

## 2013-09-23 MED ORDER — ALLOPURINOL 100 MG PO TABS: 100.0000 mg | ORAL_TABLET | Freq: Every day | ORAL | Status: DC

## 2013-09-23 NOTE — Telephone Encounter (Signed)
Spoke with pt. Lab results given and medications instructions. Pt was taking prescribed Potassium 10 meq TID.K+ 4.7 informed pt to resume once a day regimen and f/u with next appt for repeat blood work

## 2013-09-23 NOTE — Telephone Encounter (Signed)
Left message with results and new script Allopurinol

## 2013-09-23 NOTE — Telephone Encounter (Signed)
Message copied by Darlis Loan on Fri Sep 23, 2013 10:56 AM ------      Message from: Susie Cassette MD, Germain Osgood      Created: Fri Sep 23, 2013 10:35 AM       Please notify patient of the patient's uric acid is elevated at 7.2. Prescription for allopurinol 100 mg a day has been called in to [facility] ------

## 2013-10-01 ENCOUNTER — Encounter (HOSPITAL_COMMUNITY): Payer: Self-pay | Admitting: Emergency Medicine

## 2013-10-01 ENCOUNTER — Emergency Department (INDEPENDENT_AMBULATORY_CARE_PROVIDER_SITE_OTHER)
Admission: EM | Admit: 2013-10-01 | Discharge: 2013-10-01 | Disposition: A | Discharge status: Home / Self Care | Payer: BC Managed Care – PPO | Source: Home / Self Care | Attending: Emergency Medicine | Admitting: Emergency Medicine

## 2013-10-01 DIAGNOSIS — M79671 Pain in right foot: Secondary | ICD-10-CM

## 2013-10-01 DIAGNOSIS — R7989 Other specified abnormal findings of blood chemistry: Secondary | ICD-10-CM

## 2013-10-01 DIAGNOSIS — M79672 Pain in left foot: Secondary | ICD-10-CM

## 2013-10-01 DIAGNOSIS — E669 Obesity, unspecified: Secondary | ICD-10-CM

## 2013-10-01 DIAGNOSIS — M79609 Pain in unspecified limb: Secondary | ICD-10-CM

## 2013-10-01 DIAGNOSIS — E790 Hyperuricemia without signs of inflammatory arthritis and tophaceous disease: Secondary | ICD-10-CM

## 2013-10-01 HISTORY — DX: Gout, unspecified: M10.9

## 2013-10-01 MED ORDER — COLCHICINE 0.6 MG PO TABS: 0.6000 mg | ORAL_TABLET | Freq: Two times a day (BID) | ORAL | Status: DC

## 2013-10-01 NOTE — ED Notes (Signed)
C/o pain i both her feet; denies injury

## 2013-10-01 NOTE — ED Provider Notes (Signed)
CSN: [id number]     Arrival date & time 10/01/13  1526 History   First MD Initiated Contact with Patient 10/01/13 1621     Chief Complaint  Patient presents with  . Foot Pain   (Consider location/radiation/quality/duration/timing/severity/associated sxs/prior Treatment) HPI Comments: 3f morbidly obese female with foot pain, getting worse since starting allopurinol a few days ago for hyperuricemia by PCP.  She has had worsening pain for the past 3 days.  Pain is in the arches of her feet bilaterally.  Not responding to her mobic or tramadol.  She wasn't placed on PPx with colchicine. Also she has dry, cracked skin between her toes.  This has been for a couple weeks.  No treatments tried at home.    Patient is a 32 y.o. female presenting with lower extremity pain.  Foot Pain Pertinent negatives include no chest pain, no abdominal pain and no shortness of breath.    Past Medical History  Diagnosis Date  . Asthma   . Gastric ulcer   . Migraine   . Depression   . GERD (gastroesophageal reflux disease)   . Gout    Past Surgical History  Procedure Laterality Date  . Wisdom tooth extraction  2008    x4   Family History  Problem Relation Age of Onset  . Hypertension Mother   . Hyperlipidemia Mother   . Sudden death Father   . Hypertension Father   . Sudden death Brother   . Hypertension Brother   . Hyperlipidemia Brother   . Heart attack Brother   . Diabetes Neg Hx    History  Substance Use Topics  . Smoking status: Former Smoker -- 0.50 packs/day for 15.5 years    Types: Cigarettes    Quit date: 03/02/2013  . Smokeless tobacco: Not on file  . Alcohol Use: No   OB History   Grav Para Term Preterm Abortions TAB SAB Ect Mult Living                 Review of Systems  Constitutional: Negative for fever and chills.  Eyes: Negative for visual disturbance.  Respiratory: Negative for cough and shortness of breath.   Cardiovascular: Negative for chest pain, palpitations and  leg swelling.  Gastrointestinal: Negative for nausea, vomiting and abdominal pain.  Endocrine: Negative for polydipsia and polyuria.  Genitourinary: Negative for dysuria, urgency and frequency.  Musculoskeletal:       Bilateral foot pain  Skin:       Dry skin between toes  Neurological: Negative for dizziness, weakness and light-headedness.    Allergies  Review of patient's allergies indicates no known allergies.  Home Medications   Current Outpatient Rx  Name  Route  Sig  Dispense  Refill  . allopurinol (ZYLOPRIM) 100 MG tablet   Oral   Take 1 tablet (100 mg total) by mouth daily.   30 tablet   6   . ALPRAZolam (XANAX) 0.5 MG tablet   Oral   Take 0.5 mg by mouth at bedtime as needed for sleep.         Marland Kitchen aspirin-acetaminophen-caffeine (EXCEDRIN MIGRAINE) 250-250-65 MG per tablet   Oral   Take 1 tablet by mouth every 6 (six) hours as needed. For headache          . B Complex-Biotin-FA (B-COMPLEX PO)   Oral   Take 1 tablet by mouth daily.           . cetirizine (ZYRTEC) 10 MG tablet   Oral  Take 1 tablet (10 mg total) by mouth daily.   30 tablet   3   . colchicine 0.6 MG tablet   Oral   Take 1 tablet (0.6 mg total) by mouth 2 (two) times daily.   60 tablet   0   . furosemide (LASIX) 40 MG tablet   Oral   Take 1 tablet (40 mg total) by mouth daily.   30 tablet   2   . HYDROcodone-acetaminophen (NORCO) 5-325 MG per tablet   Oral   Take 1 tablet by mouth every 6 (six) hours as needed for moderate pain.   30 tablet   0   . meloxicam (MOBIC) 7.5 MG tablet   Oral   Take 1 tablet (7.5 mg total) by mouth 2 (two) times daily.   60 tablet   2   . omeprazole (PRILOSEC) 40 MG capsule   Oral   Take 40 mg by mouth daily.           . potassium chloride (K-DUR) 10 MEQ tablet   Oral   Take 1 tablet (10 mEq total) by mouth 3 (three) times daily.   90 tablet   0   . traMADol (ULTRAM) 50 MG tablet   Oral   Take 1 tablet (50 mg total) by mouth every 8  (eight) hours as needed.   30 tablet   1    BP 155/89  Pulse 107  Temp(Src) 98.1 F (36.7 C) (Oral)  Resp 18  SpO2 99%  LMP 09/08/2013 Physical Exam  Nursing note and vitals reviewed. Constitutional: She is oriented to person, place, and time. Vital signs are normal. She appears well-developed and well-nourished. No distress.  Morbid obesity   HENT:  Head: Normocephalic and atraumatic.  Pulmonary/Chest: Effort normal. No respiratory distress.  Musculoskeletal:  TTP in arches of feet with mild erythema   Neurological: She is alert and oriented to person, place, and time. She has normal strength. Coordination normal.  Skin: Skin is warm and dry. She is not diaphoretic.  Dry, cracked, malodorous skin between toes of bilateral feet   Psychiatric: She has a normal mood and affect. Judgment normal.    ED Course  Procedures (including critical care time) Labs Review Labs Reviewed - No data to display Imaging Review No results found.    MDM   1. Foot pain, bilateral   2. Hyperuricemia   3. OBESITY, NOS      Adding colchicine.  OTC athletes foot spray.  Advised weight loss.  F/U PCP     Discharge Medication List as of 10/01/2013  4:40 PM    START taking these medications   Details  colchicine 0.6 MG tablet Take 1 tablet (0.6 mg total) by mouth 2 (two) times daily., Starting 10/01/2013, Until Discontinued, Print         Graylon Good, PA-C 10/02/13 [id number]

## 2013-10-03 NOTE — ED Provider Notes (Signed)
Medical screening examination/treatment/procedure(s) were performed by non-physician practitioner and as supervising physician I was immediately available for consultation/collaboration.  Keigan Tafoya, M.D.  Kollen Armenti C Anuradha Chabot, MD 10/03/13 0751 

## 2013-10-04 ENCOUNTER — Encounter: Payer: Self-pay | Admitting: Family Medicine

## 2013-10-04 ENCOUNTER — Ambulatory Visit (INDEPENDENT_AMBULATORY_CARE_PROVIDER_SITE_OTHER): Payer: BC Managed Care – PPO | Admitting: Family Medicine

## 2013-10-04 VITALS — BP 132/87 | Ht 67.0 in | Wt >= 6400 oz

## 2013-10-04 DIAGNOSIS — M25561 Pain in right knee: Secondary | ICD-10-CM

## 2013-10-04 DIAGNOSIS — M25562 Pain in left knee: Secondary | ICD-10-CM

## 2013-10-04 DIAGNOSIS — M25569 Pain in unspecified knee: Secondary | ICD-10-CM

## 2013-10-04 DIAGNOSIS — M25579 Pain in unspecified ankle and joints of unspecified foot: Secondary | ICD-10-CM

## 2013-10-12 ENCOUNTER — Telehealth: Payer: Self-pay | Admitting: Emergency Medicine

## 2013-10-12 NOTE — Telephone Encounter (Signed)
Message copied by Darlis Loan on Wed Oct 12, 2013 10:05 AM ------      Message from: Susie Cassette MD, Germain Osgood      Created: Fri Sep 23, 2013 10:35 AM       Please notify patient of the patient's uric acid is elevated at 7.2. Prescription for allopurinol 100 mg a day has been called in to [facility] ------

## 2013-10-12 NOTE — Telephone Encounter (Signed)
Attempted to reach pt regarding f/u with picking script Allopurinol from [facility]. Left message

## 2013-10-18 ENCOUNTER — Encounter: Payer: Self-pay | Admitting: Family Medicine

## 2013-10-18 NOTE — Progress Notes (Signed)
CC: Knee and foot pain HPI: Patient is a very pleasant 33 year old morbidly obese female who presents for evaluation of bilateral knee and foot pain. She states that she was referred here to be fitted for orthotics. She states she has had long-standing problems with her knees and feet. 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 She states that she had a right knee injury in 2000 tendon and also had a injury to her left knee in which she for her anterior cruciate ligament. This was not repaired due to her body habitus and difficulty of surgery. She was sent to physical therapy for her symptoms and the physical therapist told her that she was bowlegged and that her feet turn in and recommended that she have custom orthotics made.  ROS: As above in the HPI. All other systems are stable or negative.  PMH: Gout, seasonal allergies, morbid obesity, edema, poor circulation  PSH: None  Social: Patient quit smoking one year ago. She does not drink. She works in a [employer]. She is working on weight loss.  Family: Reviewed in EMR Allergies: No known drug allergies    OBJECTIVE: APPEARANCE:  Patient in no acute distress.The patient appeared well nourished and normally developed. HEENT: No scleral icterus. Conjunctiva non-injected Resp: Non labored Skin: No rash MSK: Examination difficult due to body habitus. Full range of motion of the knees bilaterally. No significant joint line tenderness. No obvious effusion however this is limited due to body habitus. Examination of gait and feet shows collapse of longitudinal arch with severe pronation.  ASSESSMENT: #1. Bilateral knee and foot pain suspect largely due to to body habitus and long hours on concrete floor at work as well as biomechanical contribution from pes planus   PLAN: I did recommend sport insoles for this patient today. We fitted her with sport insoles with large scaphoid pad as well as medial  wedge. She did feel that this helped her foot positioning and unloaded her knees some. She is much more comfortable after creation of these insoles. I've recommended that she followup with us as needed. If she wears out her sport insoles quickly we may need to consider custom orthotics in the future for increased durability.

## 2013-10-24 ENCOUNTER — Ambulatory Visit: Payer: BC Managed Care – PPO | Attending: Internal Medicine | Admitting: Internal Medicine

## 2013-10-24 VITALS — BP 161/106 | HR 72 | Temp 98.0°F | Resp 15

## 2013-10-24 DIAGNOSIS — J019 Acute sinusitis, unspecified: Secondary | ICD-10-CM | POA: Insufficient documentation

## 2013-10-24 DIAGNOSIS — M109 Gout, unspecified: Secondary | ICD-10-CM

## 2013-10-24 DIAGNOSIS — H6690 Otitis media, unspecified, unspecified ear: Secondary | ICD-10-CM | POA: Insufficient documentation

## 2013-10-24 LAB — COMPLETE METABOLIC PANEL WITH GFR
ALT: 18 U/L (ref 0–35)
AST: 17 U/L (ref 0–37)
Albumin: 4.1 g/dL (ref 3.5–5.2)
Alkaline Phosphatase: 57 U/L (ref 39–117)
BUN: 17 mg/dL (ref 6–23)
CO2: 27 mEq/L (ref 19–32)
Calcium: 9.1 mg/dL (ref 8.4–10.5)
Chloride: 108 mEq/L (ref 96–112)
Creat: 0.74 mg/dL (ref 0.50–1.10)
GFR, Est African American: >89 mL/min
GFR, Est Non African American: >89 mL/min
Glucose, Bld: 70 mg/dL (ref 70–99)
Potassium: 4.3 mEq/L (ref 3.5–5.3)
Sodium: 144 mEq/L (ref 135–145)
Total Bilirubin: 0.2 mg/dL — ABNORMAL LOW (ref 0.3–1.2)
Total Protein: 7 g/dL (ref 6.0–8.3)

## 2013-10-24 LAB — URIC ACID: Uric Acid, Serum: 4.2 mg/dL (ref 2.4–7.0)

## 2013-10-24 MED ORDER — AMOXICILLIN-POT CLAVULANATE 875-125 MG PO TABS: 1.0000 | ORAL_TABLET | Freq: Two times a day (BID) | ORAL | Status: DC

## 2013-10-24 MED ORDER — CIPROFLOXACIN-HYDROCORTISONE 0.2-1 % OT SUSP: 3.0000 [drp] | Freq: Two times a day (BID) | OTIC | Status: AC

## 2013-10-24 NOTE — Progress Notes (Signed)
Patient ID: Catherine [name], female   DOB: 03-27-1981, 33 y.o.   MRN: [id number]   CC:  HPI:  Patient seen for bilateral knee for pain and wrist pain because of elevated uric acid levels. She was prescribed colchicine from the ER after a flare. She is already on allopurinol. She recently received and scaphoid pad (shoe insole) from Dr.Erin Jasper Riling, MD on 1/6. She feels better. She complains of left ear pain and drainage. She has been using TheraFlu. She also complains of sore throat and sinus congestion.   No Known Allergies Past Medical History  Diagnosis Date  . Asthma   . Gastric ulcer   . Migraine   . Depression   . GERD (gastroesophageal reflux disease)   . Gout    Current Outpatient Prescriptions on File Prior to Visit  Medication Sig Dispense Refill  . allopurinol (ZYLOPRIM) 100 MG tablet Take 1 tablet (100 mg total) by mouth daily.  30 tablet  6  . ALPRAZolam (XANAX) 0.5 MG tablet Take 0.5 mg by mouth at bedtime as needed for sleep.      Marland Kitchen aspirin-acetaminophen-caffeine (EXCEDRIN MIGRAINE) 250-250-65 MG per tablet Take 1 tablet by mouth every 6 (six) hours as needed. For headache       . B Complex-Biotin-FA (B-COMPLEX PO) Take 1 tablet by mouth daily.        . cetirizine (ZYRTEC) 10 MG tablet Take 1 tablet (10 mg total) by mouth daily.  30 tablet  3  . colchicine 0.6 MG tablet Take 1 tablet (0.6 mg total) by mouth 2 (two) times daily.  60 tablet  0  . furosemide (LASIX) 40 MG tablet Take 1 tablet (40 mg total) by mouth daily.  30 tablet  2  . HYDROcodone-acetaminophen (NORCO) 5-325 MG per tablet Take 1 tablet by mouth every 6 (six) hours as needed for moderate pain.  30 tablet  0  . meloxicam (MOBIC) 7.5 MG tablet Take 1 tablet (7.5 mg total) by mouth 2 (two) times daily.  60 tablet  2  . omeprazole (PRILOSEC) 40 MG capsule Take 40 mg by mouth daily.        . potassium chloride (K-DUR) 10 MEQ tablet Take 1 tablet (10 mEq total) by mouth 3 (three) times daily.  90 tablet  0  .  traMADol (ULTRAM) 50 MG tablet Take 1 tablet (50 mg total) by mouth every 8 (eight) hours as needed.  30 tablet  1   No current facility-administered medications on file prior to visit.   Family History  Problem Relation Age of Onset  . Hypertension Mother   . Hyperlipidemia Mother   . Sudden death Father   . Hypertension Father   . Sudden death Brother   . Hypertension Brother   . Hyperlipidemia Brother   . Heart attack Brother   . Diabetes Neg Hx    History   Social History  . Marital Status: Single    Spouse Name: N/A    Number of Children: 3  . Years of Education: college    Occupational History  . developmental tech    Social History Main Topics  . Smoking status: Former Smoker -- 0.50 packs/day for 15.5 years    Types: Cigarettes    Quit date: 03/02/2013  . Smokeless tobacco: Not on file  . Alcohol Use: No  . Drug Use: No  . Sexual Activity: No   Other Topics Concern  . Not on file   Social History Narrative  .  No narrative on file    Review of Systems  Constitutional: As in history of present illness HENT: Left ear pain, rhinorrhea, neck pain, neck stiffness and ear discharge.   Eyes: As in history of present illness Respiratory: Negative for cough, choking, chest tightness, shortness of breath, wheezing and stridor.   Cardiovascular: Negative for chest pain, palpitations and leg swelling.  Gastrointestinal: Negative for abdominal distention.  Genitourinary: Negative for dysuria, urgency, frequency, hematuria, flank pain, decreased urine volume, difficulty urinating and dyspareunia.  Musculoskeletal: Negative for back pain, joint swelling, arthralgias and gait problem.  Neurological: Negative for dizziness, tremors, seizures, syncope, facial asymmetry, speech difficulty, weakness, light-headedness, numbness and headaches.  Hematological: Negative for adenopathy. Does not bruise/bleed easily.  Psychiatric/Behavioral: Negative for hallucinations, behavioral  problems, confusion, dysphoric mood, decreased concentration and agitation.    Objective:   Filed Vitals:   10/24/13 1548  BP: 161/106  Pulse: 72  Temp: 98 F (36.7 C)  Resp: 15    Physical Exam  Constitutional: Appears well-developed and well-nourished. No distress.  HENT: Normocephalic. External right and left ear normal. Oropharynx is clear and moist.  drainage from the left ear, distended tympanic membrane Eyes: Conjunctivae and EOM are normal. PERRLA, no scleral icterus.  Neck: Normal ROM. Neck supple. No JVD. No tracheal deviation. No thyromegaly.  CVS: RRR, S1/S2 +, no murmurs, no gallops, no carotid bruit.  Pulmonary: Effort and breath sounds normal, no stridor, rhonchi, wheezes, rales.  Abdominal: Soft. BS +,  no distension, tenderness, rebound or guarding.  Musculoskeletal: Normal range of motion. No edema and no tenderness.  Lymphadenopathy: No lymphadenopathy noted, cervical, inguinal. Neuro: Alert. Normal reflexes, muscle tone coordination. No cranial nerve deficit. Skin: Skin is warm and dry. No rash noted. Not diaphoretic. No erythema. No pallor.  Psychiatric: Normal mood and affect. Behavior, judgment, thought content normal.   Lab Results  Component Value Date   WBC 12.6* 09/21/2013   HGB 13.3 09/21/2013   HCT 40.1 09/21/2013   MCV 85.5 09/21/2013   PLT 322 09/21/2013   Lab Results  Component Value Date   CREATININE 0.93 09/21/2013   BUN 21 09/21/2013   NA 139 09/21/2013   K 4.7 09/21/2013   CL 103 09/21/2013   CO2 26 09/21/2013    No results found for this basename: HGBA1C   Lipid Panel     Component Value Date/Time   CHOL 154 09/21/2013 1635   TRIG 61 09/21/2013 1635   HDL 41 09/21/2013 1635   CHOLHDL 3.8 09/21/2013 1635   VLDL 12 09/21/2013 1635   LDLCALC 101* 09/21/2013 1635       Assessment and plan:   Patient Active Problem List   Diagnosis Date Noted  . KNEE INJURY, LEFT 01/01/2009  . ONYCHOMYCOSIS, TOENAILS 11/22/2008  . TINEA  PEDIS 11/22/2008  . BIPOLAR DISORDER UNSPECIFIED 11/22/2008  . FOOT PAIN, RIGHT 11/22/2008  . DEPENDENT EDEMA, LEGS, BILATERAL 11/22/2008  . CELLULITIS AND ABSCESS OF LEG EXCEPT FOOT 12/30/2007  . OBESITY, NOS 12/10/2006  . Depressive Disorder, not Elsewhere Classified 12/10/2006  . MIGRAINE, UNSPEC., W/O INTRACTABLE MIGRAINE 12/10/2006  . ASTHMA, UNSPECIFIED 12/10/2006  . GASTROESOPHAGEAL REFLUX, NO ESOPHAGITIS 12/10/2006  . OSA (obstructive sleep apnea) 12/10/2006       Acute otitis media Acute sinusitis Prescribed Augmentin for 10 days Cipro HC otic eardrops Recommend over-the-counter decongestants an antihistamine   Gout Advised patient to discontinue colchicine if her pain is under control and continue allopurinol Repeat uric acid and renal function  Follow  up in 2 months   The patient was given clear instructions to go to ER or return to medical center if symptoms don't improve, worsen or new problems develop. The patient verbalized understanding. The patient was told to call to get any lab results if not heard anything in the next week.

## 2013-10-24 NOTE — Progress Notes (Signed)
Patient  Here for follow up on uric acid level Complains of congestion Ear pain headache

## 2013-10-26 ENCOUNTER — Other Ambulatory Visit: Payer: Self-pay | Admitting: Family Medicine

## 2013-11-17 ENCOUNTER — Other Ambulatory Visit: Payer: Self-pay | Admitting: Internal Medicine

## 2013-11-17 NOTE — Telephone Encounter (Signed)
Pt calling because she says [facility] still has not received refill for traMADol (ULTRAM) 50 MG tablet, please f/u with pharmacy or patient.

## 2013-11-18 ENCOUNTER — Telehealth: Payer: Self-pay | Admitting: Emergency Medicine

## 2013-11-18 MED ORDER — TRAMADOL HCL 50 MG PO TABS: 50.0000 mg | ORAL_TABLET | Freq: Three times a day (TID) | ORAL | Status: DC | PRN

## 2013-11-18 NOTE — Telephone Encounter (Signed)
Left message for pt to call clinic for request for Tramadol

## 2013-11-18 NOTE — Telephone Encounter (Signed)
Patient called to get her ultram.

## 2013-12-11 ENCOUNTER — Emergency Department (HOSPITAL_COMMUNITY)
Admission: EM | Admit: 2013-12-11 | Discharge: 2013-12-11 | Disposition: A | Discharge status: Home / Self Care | Payer: Self-pay | Source: Home / Self Care | Attending: Family Medicine | Admitting: Family Medicine

## 2013-12-11 ENCOUNTER — Encounter (HOSPITAL_COMMUNITY): Payer: Self-pay | Admitting: Emergency Medicine

## 2013-12-11 DIAGNOSIS — J329 Chronic sinusitis, unspecified: Secondary | ICD-10-CM

## 2013-12-11 MED ORDER — IPRATROPIUM BROMIDE 0.06 % NA SOLN: 2.0000 | Freq: Four times a day (QID) | NASAL | Status: DC

## 2013-12-11 MED ORDER — PREDNISONE 50 MG PO TABS: 50.0000 mg | ORAL_TABLET | Freq: Every day | ORAL | Status: DC

## 2013-12-11 MED ORDER — AZITHROMYCIN 250 MG PO TABS: 250.0000 mg | ORAL_TABLET | Freq: Every day | ORAL | Status: DC

## 2013-12-11 NOTE — ED Notes (Signed)
Patient complains of head congestion with sinus pressure and headaches; states there is pain back of neck that she thinks is causing her head congestion. Noticed congestion and pain yesterday. Denies cough, fever/chills.

## 2013-12-11 NOTE — ED Provider Notes (Signed)
Catherine [name] is a 33 y.o. female who presents to Urgent Care today for nasal congestion sore throat headache eye crusting. Patient also notes significant facial pressure. She notes mild nasal discharge as well. This is been present for 5 days. Patient has tried Sudafed which does not help much. No nausea vomiting or diarrhea. No shortness of breath fevers or chills.   Past Medical History  Diagnosis Date  . Asthma   . Gastric ulcer   . Migraine   . Depression   . GERD (gastroesophageal reflux disease)   . Gout    History  Substance Use Topics  . Smoking status: Former Smoker -- 0.50 packs/day for 15.5 years    Types: Cigarettes    Quit date: 03/02/2013  . Smokeless tobacco: Not on file  . Alcohol Use: No   ROS as above Medications: No current facility-administered medications for this encounter.   Current Outpatient Prescriptions  Medication Sig Dispense Refill  . allopurinol (ZYLOPRIM) 100 MG tablet Take 1 tablet (100 mg total) by mouth daily.  30 tablet  6  . aspirin-acetaminophen-caffeine (EXCEDRIN MIGRAINE) 250-250-65 MG per tablet Take 1 tablet by mouth every 6 (six) hours as needed. For headache       . B Complex-Biotin-FA (B-COMPLEX PO) Take 1 tablet by mouth daily.        . cetirizine (ZYRTEC) 10 MG tablet Take 1 tablet (10 mg total) by mouth daily.  30 tablet  3  . furosemide (LASIX) 40 MG tablet Take 1 tablet (40 mg total) by mouth daily.  30 tablet  2  . meloxicam (MOBIC) 7.5 MG tablet TAKE 1 TABLET BY MOUTH 2 TIMES DAILY  60 tablet  2  . omeprazole (PRILOSEC) 40 MG capsule Take 40 mg by mouth daily.        . potassium chloride (K-DUR) 10 MEQ tablet Take 1 tablet (10 mEq total) by mouth 3 (three) times daily.  90 tablet  0  . traMADol (ULTRAM) 50 MG tablet Take 1 tablet (50 mg total) by mouth every 8 (eight) hours as needed.  30 tablet  1  . ALPRAZolam (XANAX) 0.5 MG tablet Take 0.5 mg by mouth at bedtime as needed for sleep.      Marland Kitchen. amoxicillin-clavulanate  (AUGMENTIN) 875-125 MG per tablet Take 1 tablet by mouth 2 (two) times daily.  20 tablet  0  . azithromycin (ZITHROMAX) 250 MG tablet Take 1 tablet (250 mg total) by mouth daily. Take first 2 tablets together, then 1 every day until finished.  6 tablet  0  . colchicine 0.6 MG tablet Take 1 tablet (0.6 mg total) by mouth 2 (two) times daily.  60 tablet  0  . HYDROcodone-acetaminophen (NORCO) 5-325 MG per tablet Take 1 tablet by mouth every 6 (six) hours as needed for moderate pain.  30 tablet  0  . ipratropium (ATROVENT) 0.06 % nasal spray Place 2 sprays into both nostrils 4 (four) times daily.  15 mL  1  . predniSONE (DELTASONE) 50 MG tablet Take 1 tablet (50 mg total) by mouth daily.  5 tablet  0    Exam:  BP 147/85  Pulse 108  Temp(Src) 98.5 F (36.9 C) (Oral)  Resp 25  SpO2 100%  LMP 11/17/2013 Gen: Well NAD morbidly obese HEENT: EOMI,  MMM posterior pharynx with cobblestoning. Tympanic membranes are normal appearing bilaterally. Tender palpation right maxillary sinus. Clear nasal discharge. Normal-appearing eyes bilaterally Lungs: Normal work of breathing. CTABL Heart: RRR no MRG Abd:  NABS, Soft. NT, ND Exts: Brisk capillary refill, warm and well perfused.   No results found for this or any previous visit (from the past 24 hour(s)). No results found.  Assessment and Plan: 33 y.o. female with sinusitis. Plan to treat with azithromycin prednisone and Atrovent nasal spray.  Discussed warning signs or symptoms. Please see discharge instructions. Patient expresses understanding.    Rodolph Bong, MD 12/11/13 [phone]

## 2013-12-11 NOTE — Discharge Instructions (Signed)
Thank you for coming in today. Take the medicine as directed.  Call or go to the emergency room if you get worse, have trouble breathing, have chest pains, or palpitations.   Sinusitis Sinusitis is redness, soreness, and swelling (inflammation) of the paranasal sinuses. Paranasal sinuses are air pockets within the bones of your face (beneath the eyes, the middle of the forehead, or above the eyes). In healthy paranasal sinuses, mucus is able to drain out, and air is able to circulate through them by way of your nose. However, when your paranasal sinuses are inflamed, mucus and air can become trapped. This can allow bacteria and other germs to grow and cause infection. Sinusitis can develop quickly and last only a short time (acute) or continue over a long period (chronic). Sinusitis that lasts for more than 12 weeks is considered chronic.  CAUSES  Causes of sinusitis include:  Allergies.  Structural abnormalities, such as displacement of the cartilage that separates your nostrils (deviated septum), which can decrease the air flow through your nose and sinuses and affect sinus drainage.  Functional abnormalities, such as when the small hairs (cilia) that line your sinuses and help remove mucus do not work properly or are not present. SYMPTOMS  Symptoms of acute and chronic sinusitis are the same. The primary symptoms are pain and pressure around the affected sinuses. Other symptoms include:  Upper toothache.  Earache.  Headache.  Bad breath.  Decreased sense of smell and taste.  A cough, which worsens when you are lying flat.  Fatigue.  Fever.  Thick drainage from your nose, which often is green and may contain pus (purulent).  Swelling and warmth over the affected sinuses. DIAGNOSIS  Your caregiver will perform a physical exam. During the exam, your caregiver may:  Look in your nose for signs of abnormal growths in your nostrils (nasal polyps).  Tap over the affected sinus  to check for signs of infection.  View the inside of your sinuses (endoscopy) with a special imaging device with a light attached (endoscope), which is inserted into your sinuses. If your caregiver suspects that you have chronic sinusitis, one or more of the following tests may be recommended:  Allergy tests.  Nasal culture A sample of mucus is taken from your nose and sent to a lab and screened for bacteria.  Nasal cytology A sample of mucus is taken from your nose and examined by your caregiver to determine if your sinusitis is related to an allergy. TREATMENT  Most cases of acute sinusitis are related to a viral infection and will resolve on their own within 10 days. Sometimes medicines are prescribed to help relieve symptoms (pain medicine, decongestants, nasal steroid sprays, or saline sprays).  However, for sinusitis related to a bacterial infection, your caregiver will prescribe antibiotic medicines. These are medicines that will help kill the bacteria causing the infection.  Rarely, sinusitis is caused by a fungal infection. In theses cases, your caregiver will prescribe antifungal medicine. For some cases of chronic sinusitis, surgery is needed. Generally, these are cases in which sinusitis recurs more than 3 times per year, despite other treatments. HOME CARE INSTRUCTIONS   Drink plenty of water. Water helps thin the mucus so your sinuses can drain more easily.  Use a humidifier.  Inhale steam 3 to 4 times a day (for example, sit in the bathroom with the shower running).  Apply a warm, moist washcloth to your face 3 to 4 times a day, or as directed by your  caregiver.  Use saline nasal sprays to help moisten and clean your sinuses.  Take over-the-counter or prescription medicines for pain, discomfort, or fever only as directed by your caregiver. SEEK IMMEDIATE MEDICAL CARE IF:  You have increasing pain or severe headaches.  You have nausea, vomiting, or drowsiness.  You  have swelling around your face.  You have vision problems.  You have a stiff neck.  You have difficulty breathing. MAKE SURE YOU:   Understand these instructions.  Will watch your condition.  Will get help right away if you are not doing well or get worse. Document Released: 09/29/2005 Document Revised: 12/22/2011 Document Reviewed: 10/14/2011 [facility] Patient Information 2014 [place], [place].

## 2014-02-02 ENCOUNTER — Other Ambulatory Visit: Payer: Self-pay | Admitting: Family Medicine

## 2014-03-13 ENCOUNTER — Ambulatory Visit: Payer: BC Managed Care – PPO | Admitting: Pulmonary Disease

## 2014-03-13 ENCOUNTER — Other Ambulatory Visit: Payer: Self-pay | Admitting: Family Medicine

## 2014-03-13 DIAGNOSIS — R609 Edema, unspecified: Secondary | ICD-10-CM

## 2014-03-20 ENCOUNTER — Encounter: Payer: Self-pay | Admitting: Pulmonary Disease

## 2014-04-25 ENCOUNTER — Other Ambulatory Visit: Payer: Self-pay | Admitting: Family Medicine

## 2014-05-04 ENCOUNTER — Other Ambulatory Visit: Payer: Self-pay | Admitting: *Deleted

## 2014-05-14 MED ORDER — MELOXICAM 7.5 MG PO TABS: ORAL_TABLET | ORAL | Status: DC

## 2014-06-19 ENCOUNTER — Encounter (HOSPITAL_COMMUNITY): Payer: Self-pay | Admitting: Emergency Medicine

## 2014-06-19 ENCOUNTER — Emergency Department (HOSPITAL_COMMUNITY)
Admission: EM | Admit: 2014-06-19 | Discharge: 2014-06-19 | Disposition: A | Discharge status: Home / Self Care | Payer: BC Managed Care – PPO | Attending: Emergency Medicine | Admitting: Emergency Medicine

## 2014-06-19 DIAGNOSIS — H9209 Otalgia, unspecified ear: Secondary | ICD-10-CM | POA: Insufficient documentation

## 2014-06-19 DIAGNOSIS — J45909 Unspecified asthma, uncomplicated: Secondary | ICD-10-CM | POA: Insufficient documentation

## 2014-06-19 DIAGNOSIS — J0110 Acute frontal sinusitis, unspecified: Secondary | ICD-10-CM | POA: Insufficient documentation

## 2014-06-19 DIAGNOSIS — J0111 Acute recurrent frontal sinusitis: Secondary | ICD-10-CM

## 2014-06-19 DIAGNOSIS — Z8659 Personal history of other mental and behavioral disorders: Secondary | ICD-10-CM | POA: Insufficient documentation

## 2014-06-19 DIAGNOSIS — G43909 Migraine, unspecified, not intractable, without status migrainosus: Secondary | ICD-10-CM | POA: Insufficient documentation

## 2014-06-19 DIAGNOSIS — Z87891 Personal history of nicotine dependence: Secondary | ICD-10-CM | POA: Insufficient documentation

## 2014-06-19 DIAGNOSIS — K219 Gastro-esophageal reflux disease without esophagitis: Secondary | ICD-10-CM | POA: Insufficient documentation

## 2014-06-19 DIAGNOSIS — M109 Gout, unspecified: Secondary | ICD-10-CM | POA: Insufficient documentation

## 2014-06-19 DIAGNOSIS — J0100 Acute maxillary sinusitis, unspecified: Secondary | ICD-10-CM | POA: Insufficient documentation

## 2014-06-19 DIAGNOSIS — Z79899 Other long term (current) drug therapy: Secondary | ICD-10-CM | POA: Insufficient documentation

## 2014-06-19 MED ORDER — AMOXICILLIN-POT CLAVULANATE 875-125 MG PO TABS: 1.0000 | ORAL_TABLET | Freq: Two times a day (BID) | ORAL | Status: DC

## 2014-06-19 MED ORDER — AMOXICILLIN-POT CLAVULANATE 875-125 MG PO TABS
1.0000 | ORAL_TABLET | Freq: Once | ORAL | Status: AC
  Administered 2014-06-19: 1 via ORAL
  Filled 2014-06-19: qty 1

## 2014-06-19 NOTE — Discharge Instructions (Signed)

## 2014-06-19 NOTE — ED Notes (Signed)
Pt knows to take antibiotic until completion. Advised to follow up with ENT MD if symptoms persist. No other questions/concerns.

## 2014-06-19 NOTE — ED Provider Notes (Signed)
Medical screening examination/treatment/procedure(s) were performed by non-physician practitioner and as supervising physician I was immediately available for consultation/collaboration.   EKG Interpretation None        Mirian Mo, MD 06/19/14 2348

## 2014-06-19 NOTE — ED Notes (Signed)
Pt reports bila ear pain and L eye pain x 3 days.

## 2014-06-19 NOTE — ED Provider Notes (Signed)
CSN: [id number]     Arrival date & time 06/19/14  1945 History   None    Chief Complaint  Patient presents with  . Otalgia   Patient is a 33 y.o. female presenting with ear pain. The history is provided by the patient. No language interpreter was used.  Otalgia Associated symptoms: congestion and headaches   Associated symptoms: no fever    This chart was scribed for nurse practitioner Earley Favor, NP working with No att. providers found, by Andrew Au, ED Scribe. This patient was seen in room WTR6/WTR6 and the patient's care was started at 9:35 PM.  [name] is a 33 y.o. female who presents to the Emergency Department complaining of bilateral otalgia and left eye pain onset 3 days. Pt reports  shooting pain in bilateral ears. Pt has tried tylenol sinus and a decongestant. She was seen 6 months ago for sinusitis and reports similar symptoms. Pt last menstrual cycle was 1.5 weeks ago.  Past Medical History  Diagnosis Date  . Asthma   . Gastric ulcer   . Migraine   . Depression   . GERD (gastroesophageal reflux disease)   . Gout    Past Surgical History  Procedure Laterality Date  . Wisdom tooth extraction  2008    x4   Family History  Problem Relation Age of Onset  . Hypertension Mother   . Hyperlipidemia Mother   . Sudden death Father   . Hypertension Father   . Sudden death Brother   . Hypertension Brother   . Hyperlipidemia Brother   . Heart attack Brother   . Diabetes Neg Hx    History  Substance Use Topics  . Smoking status: Former Smoker -- 0.50 packs/day for 15.5 years    Types: Cigarettes    Quit date: 03/02/2013  . Smokeless tobacco: Not on file  . Alcohol Use: No   OB History   Grav Para Term Preterm Abortions TAB SAB Ect Mult Living                 Review of Systems  Constitutional: Negative for fever.  HENT: Positive for congestion, ear pain and sinus pressure. Negative for facial swelling.   Eyes: Positive for pain.  Neurological: Positive  for headaches.  All other systems reviewed and are negative.  Allergies  Review of patient's allergies indicates no known allergies.  Home Medications   Prior to Admission medications   Medication Sig Start Date End Date Taking? Authorizing Provider  acetaminophen (TYLENOL) 160 MG/5ML suspension Take 960 mg by mouth every 6 (six) hours as needed (for pain.).   Yes Historical Provider, MD  allopurinol (ZYLOPRIM) 100 MG tablet Take 100 mg by mouth daily.   Yes Historical Provider, MD  aspirin-acetaminophen-caffeine (EXCEDRIN MIGRAINE) [phone] MG per tablet Take 1 tablet by mouth every 6 (six) hours as needed. For headache    Yes Historical Provider, MD  B Complex-Biotin-FA (B-COMPLEX PO) Take 1 tablet by mouth daily.     Yes Historical Provider, MD  cetirizine (ZYRTEC) 10 MG tablet Take 10 mg by mouth daily.   Yes Historical Provider, MD  furosemide (LASIX) 40 MG tablet Take 40 mg by mouth daily.   Yes Historical Provider, MD  ipratropium (ATROVENT) 0.06 % nasal spray Place 2 sprays into both nostrils 4 (four) times daily. 12/11/13  Yes Rodolph Bong, MD  omeprazole (PRILOSEC) 40 MG capsule Take 40 mg by mouth daily.     Yes Historical Provider, MD  potassium chloride (K-DUR) 10 MEQ tablet Take 10 mEq by mouth daily.   Yes Historical Provider, MD  Pseudoephedrine-Acetaminophen (SINUS APAP MAXIMUM STRENGTH PO) Take 2 tablets by mouth every 6 (six) hours as needed (for sinus pain).   Yes Historical Provider, MD   BP 153/113  Pulse 93  Temp(Src) 98.1 F (36.7 C) (Oral)  Resp 16  SpO2 100% Physical Exam  Nursing note and vitals reviewed. Constitutional: She is oriented to person, place, and time. She appears well-developed and well-nourished. No distress.  HENT:  Head: Normocephalic and atraumatic.  Right Ear: External ear normal.  Left Ear: External ear normal.  Nose: Right sinus exhibits maxillary sinus tenderness and frontal sinus tenderness. Left sinus exhibits maxillary sinus  tenderness and frontal sinus tenderness.  Mouth/Throat: Oropharynx is clear and moist.  Eyes: Conjunctivae and EOM are normal.  Neck: Neck supple.  Cardiovascular: Normal rate.   Pulmonary/Chest: Effort normal.  Musculoskeletal: Normal range of motion.  Neurological: She is alert and oriented to person, place, and time.  Skin: Skin is warm and dry.  Psychiatric: She has a normal mood and affect. Her behavior is normal.    ED Course  Procedures (including critical care time) DIAGNOSTIC STUDIES: Oxygen Saturation is 100% on RA, normal by my interpretation.    COORDINATION OF CARE: 9:40 PM- Pt advised of plan for treatment and pt agrees. Referral to ENT.  Labs Review Labs Reviewed - No data to display  Imaging Review No results found.   EKG Interpretation None      MDM   Final diagnoses:  None   Started Augmentin pt to continue OTC decongestant I personally performed the services described in this documentation, which was scribed in my presence. The recorded information has been reviewed and is accurate.      Arman Filter, NP 06/19/14 2148

## 2014-07-05 ENCOUNTER — Ambulatory Visit: Payer: Self-pay | Attending: Internal Medicine | Admitting: Internal Medicine

## 2014-07-05 ENCOUNTER — Encounter: Payer: Self-pay | Admitting: Internal Medicine

## 2014-07-05 VITALS — BP 147/94 | HR 89 | Temp 98.1°F | Resp 18 | Ht 69.0 in | Wt >= 6400 oz

## 2014-07-05 DIAGNOSIS — G4700 Insomnia, unspecified: Secondary | ICD-10-CM | POA: Insufficient documentation

## 2014-07-05 DIAGNOSIS — K259 Gastric ulcer, unspecified as acute or chronic, without hemorrhage or perforation: Secondary | ICD-10-CM | POA: Insufficient documentation

## 2014-07-05 DIAGNOSIS — Z8249 Family history of ischemic heart disease and other diseases of the circulatory system: Secondary | ICD-10-CM | POA: Insufficient documentation

## 2014-07-05 DIAGNOSIS — M25562 Pain in left knee: Secondary | ICD-10-CM

## 2014-07-05 DIAGNOSIS — Z79899 Other long term (current) drug therapy: Secondary | ICD-10-CM | POA: Insufficient documentation

## 2014-07-05 DIAGNOSIS — M109 Gout, unspecified: Secondary | ICD-10-CM | POA: Insufficient documentation

## 2014-07-05 DIAGNOSIS — G8929 Other chronic pain: Secondary | ICD-10-CM

## 2014-07-05 DIAGNOSIS — Z23 Encounter for immunization: Secondary | ICD-10-CM | POA: Insufficient documentation

## 2014-07-05 DIAGNOSIS — F3289 Other specified depressive episodes: Secondary | ICD-10-CM | POA: Insufficient documentation

## 2014-07-05 DIAGNOSIS — F329 Major depressive disorder, single episode, unspecified: Secondary | ICD-10-CM | POA: Insufficient documentation

## 2014-07-05 DIAGNOSIS — Z87891 Personal history of nicotine dependence: Secondary | ICD-10-CM | POA: Insufficient documentation

## 2014-07-05 DIAGNOSIS — M25569 Pain in unspecified knee: Secondary | ICD-10-CM | POA: Insufficient documentation

## 2014-07-05 DIAGNOSIS — I10 Essential (primary) hypertension: Secondary | ICD-10-CM | POA: Insufficient documentation

## 2014-07-05 DIAGNOSIS — J329 Chronic sinusitis, unspecified: Secondary | ICD-10-CM | POA: Insufficient documentation

## 2014-07-05 DIAGNOSIS — K219 Gastro-esophageal reflux disease without esophagitis: Secondary | ICD-10-CM | POA: Insufficient documentation

## 2014-07-05 DIAGNOSIS — J45909 Unspecified asthma, uncomplicated: Secondary | ICD-10-CM | POA: Insufficient documentation

## 2014-07-05 DIAGNOSIS — G43909 Migraine, unspecified, not intractable, without status migrainosus: Secondary | ICD-10-CM | POA: Insufficient documentation

## 2014-07-05 MED ORDER — FLUCONAZOLE 150 MG PO TABS: 150.0000 mg | ORAL_TABLET | Freq: Once | ORAL | Status: DC

## 2014-07-05 MED ORDER — AMLODIPINE BESYLATE 5 MG PO TABS: 5.0000 mg | ORAL_TABLET | Freq: Every day | ORAL | Status: DC

## 2014-07-05 MED ORDER — DOXYCYCLINE HYCLATE 100 MG PO TABS: 100.0000 mg | ORAL_TABLET | Freq: Two times a day (BID) | ORAL | Status: DC

## 2014-07-05 MED ORDER — TRAZODONE HCL 50 MG PO TABS: 25.0000 mg | ORAL_TABLET | Freq: Every evening | ORAL | Status: DC | PRN

## 2014-07-05 MED ORDER — PREDNISONE 10 MG PO TABS: 10.0000 mg | ORAL_TABLET | Freq: Every day | ORAL | Status: DC

## 2014-07-05 MED ORDER — DICLOFENAC SODIUM 1 % TD GEL: 4.0000 g | Freq: Four times a day (QID) | TRANSDERMAL | Status: DC

## 2014-07-05 NOTE — Progress Notes (Signed)
HFU Pt stated need medicine refill

## 2014-07-05 NOTE — Progress Notes (Signed)
Patient ID: Catherine [name], female   DOB: August 24, 1981, 33 y.o.   MRN: [id number]  CC: HFU  HPI:  Patient presents to clinic today as a hospital follow up of sinusitis and bilateral ear pain.  She was give Augmentin for infection which she completed the whole course last week.  She states that she is still having symptoms of bilateral ear pain and frontal headaches.  Ears feel itchy and have a sharp pain.  Headaches are described as a pressure and sharp pain.  Denies sore throat.  She endorses cough with yellow sputum production.  She is a current smoker of around 5 cigarettes per day.  She is requesting refill of meloxicam for knee arthritis.  She reports that she has never been diagnosed with hypertension although she has had several elevated blood pressures in the past.    No Known Allergies Past Medical History  Diagnosis Date  . Gastric ulcer   . Migraine   . GERD (gastroesophageal reflux disease)   . Gout   . Asthma     since baby  . Depression 2003Dx   Current Outpatient Prescriptions on File Prior to Visit  Medication Sig Dispense Refill  . acetaminophen (TYLENOL) 160 MG/5ML suspension Take 960 mg by mouth every 6 (six) hours as needed (for pain.).      Marland Kitchen allopurinol (ZYLOPRIM) 100 MG tablet Take 100 mg by mouth daily.      Marland Kitchen aspirin-acetaminophen-caffeine (EXCEDRIN MIGRAINE) 250-250-65 MG per tablet Take 1 tablet by mouth every 6 (six) hours as needed. For headache       . B Complex-Biotin-FA (B-COMPLEX PO) Take 1 tablet by mouth daily.        . cetirizine (ZYRTEC) 10 MG tablet Take 10 mg by mouth daily.      . furosemide (LASIX) 40 MG tablet Take 40 mg by mouth daily.      Marland Kitchen omeprazole (PRILOSEC) 40 MG capsule Take 40 mg by mouth daily.        . potassium chloride (K-DUR) 10 MEQ tablet Take 10 mEq by mouth daily.      . Pseudoephedrine-Acetaminophen (SINUS APAP MAXIMUM STRENGTH PO) Take 2 tablets by mouth every 6 (six) hours as needed (for sinus pain).      Marland Kitchen  amoxicillin-clavulanate (AUGMENTIN) 875-125 MG per tablet Take 1 tablet by mouth 2 (two) times daily.  13 tablet  0  . ipratropium (ATROVENT) 0.06 % nasal spray Place 2 sprays into both nostrils 4 (four) times daily.  15 mL  1   No current facility-administered medications on file prior to visit.   Family History  Problem Relation Age of Onset  . Hypertension Mother   . Hyperlipidemia Mother   . Sudden death Father   . Hypertension Father   . Sudden death Brother   . Hypertension Brother   . Hyperlipidemia Brother   . Heart attack Brother   . Diabetes Neg Hx    History   Social History  . Marital Status: Single    Spouse Name: N/A    Number of Children: 3  . Years of Education: college    Occupational History  . developmental tech    Social History Main Topics  . Smoking status: Former Smoker -- 0.50 packs/day for 15.5 years    Types: Cigarettes    Quit date: 03/02/2013  . Smokeless tobacco: Not on file  . Alcohol Use: No  . Drug Use: No  . Sexual Activity: No   Other Topics Concern  .  Not on file   Social History Narrative  . No narrative on file    Review of Systems: See HPI    Objective:   Filed Vitals:   07/05/14 1513  BP: 147/94  Pulse: 89  Temp: 98.1 F (36.7 C)  Resp: 18   Physical Exam  HENT:  Right Ear: External ear normal.  Left Ear: External ear normal.  Mouth/Throat: Oropharynx is clear and moist.  Frontal and maxillary sinus tenderness upon palpitation   Eyes: Conjunctivae and EOM are normal. Pupils are equal, round, and reactive to light. Right eye exhibits no discharge. Left eye exhibits no discharge.  Neck: Normal range of motion. Neck supple.  Cardiovascular: Normal rate, regular rhythm and normal heart sounds.   Pulmonary/Chest: Effort normal and breath sounds normal.  Musculoskeletal: Normal range of motion.  Lymphadenopathy:    She has no cervical adenopathy.  Skin: Skin is warm and dry. She is not diaphoretic.  '  Lab  Results  Component Value Date   WBC 12.6* 09/21/2013   HGB 13.3 09/21/2013   HCT 40.1 09/21/2013   MCV 85.5 09/21/2013   PLT 322 09/21/2013   Lab Results  Component Value Date   CREATININE 0.74 10/24/2013   BUN 17 10/24/2013   NA 144 10/24/2013   K 4.3 10/24/2013   CL 108 10/24/2013   CO2 27 10/24/2013    No results found for this basename: HGBA1C   Lipid Panel     Component Value Date/Time   CHOL 154 09/21/2013 1635   TRIG 61 09/21/2013 1635   HDL 41 09/21/2013 1635   CHOLHDL 3.8 09/21/2013 1635   VLDL 12 09/21/2013 1635   LDLCALC 101* 09/21/2013 1635       Assessment and plan:   Merrily was seen today for hospitalization follow-up.  Diagnoses and associated orders for this visit:  Essential hypertension - amLODipine (NORVASC) 5 MG tablet; Take 1 tablet (5 mg total) by mouth daily.  Recurrent sinusitis, unspecified chronicity, unspecified location - doxycycline (VIBRA-TABS) 100 MG tablet; Take 1 tablet (100 mg total) by mouth 2 (two) times daily. - predniSONE (DELTASONE) 10 MG tablet; Take 1 tablet (10 mg total) by mouth daily with breakfast. - fluconazole (DIFLUCAN) 150 MG tablet; Take 1 tablet (150 mg total) by mouth once.  Knee pain, chronic, left - diclofenac sodium (VOLTAREN) 1 % GEL; Apply 4 g topically 4 (four) times daily.  Morbid obesity Stressed weight loss in great detail and complications of severe obesity  Insomnia - traZODone (DESYREL) 50 MG tablet; Take 0.5 tablets (25 mg total) by mouth at bedtime as needed for sleep.  Need for prophylactic vaccination and inoculation against influenza Received   Return in about 2 weeks (around 07/19/2014) for Nurse Visit-BP check and sinusitis check  3 mo PCP. Nurse will notify NP if BP continues to be elevated at visit, may need increase of amlodipine.  Nurse will also chart if there is improvement in sinusitis.     Holland Commons, NP-C [facility] [phone] 07/16/2014, 3:38 PM

## 2014-07-20 ENCOUNTER — Ambulatory Visit: Payer: BC Managed Care – PPO | Attending: Internal Medicine | Admitting: Pharmacist

## 2014-07-20 VITALS — BP 138/87 | HR 89 | Temp 98.5°F | Resp 16

## 2014-07-20 DIAGNOSIS — I10 Essential (primary) hypertension: Secondary | ICD-10-CM

## 2014-07-20 NOTE — Progress Notes (Signed)
Patient was started on amlodipine and about two weeks ago and Here today for a recheck on her blood pressure Today blood pressure was 138/87

## 2014-08-10 ENCOUNTER — Ambulatory Visit: Payer: BC Managed Care – PPO

## 2014-08-10 ENCOUNTER — Other Ambulatory Visit: Payer: Self-pay | Admitting: Internal Medicine

## 2014-08-30 ENCOUNTER — Other Ambulatory Visit: Payer: Self-pay | Admitting: Internal Medicine

## 2014-09-01 ENCOUNTER — Ambulatory Visit: Payer: Self-pay | Attending: Internal Medicine

## 2014-09-04 ENCOUNTER — Telehealth: Payer: Self-pay | Admitting: Internal Medicine

## 2014-09-04 NOTE — Telephone Encounter (Signed)
Pt. Called wanting to know why the refills for her allopurinol (ZYLOPRIM) 100 MG tablet were denied. Please f/u with p.t

## 2014-09-06 NOTE — Telephone Encounter (Signed)
Pt. Called wanting to know why the refills for her allopurinol (ZYLOPRIM) 100 MG tablet were denied. Please f/u with p.t °

## 2014-09-12 ENCOUNTER — Telehealth: Payer: Self-pay | Admitting: Internal Medicine

## 2014-09-12 NOTE — Telephone Encounter (Signed)
Patient has calle din today to request a medication refill for allopurinol (ZYLOPRIM) 100 MG tablet; please f/u with patient about this request;

## 2014-09-14 ENCOUNTER — Telehealth: Payer: Self-pay

## 2014-09-14 MED ORDER — ALLOPURINOL 100 MG PO TABS: 100.0000 mg | ORAL_TABLET | Freq: Every day | ORAL | Status: DC

## 2014-09-14 NOTE — Telephone Encounter (Signed)
Patient called requesting a refill on her allopurinol  Patient states she is having a gout flair up [facility] supply

## 2014-09-14 NOTE — Telephone Encounter (Signed)
Returned patient phone call Patient not available Left message on voice mail to return our call 

## 2014-09-29 ENCOUNTER — Telehealth: Payer: Self-pay | Admitting: Internal Medicine

## 2014-09-29 NOTE — Telephone Encounter (Signed)
Pt. Called stating that she had a sore on the inside of her nose a couple of days ago, this morning patient noticed that it had spread to the outside of her nose.. Patient stated that she has been putting ointments on the sore but it has not been helping.. Pt. Would like to speak to nurse. Please f/u with pt.

## 2014-10-02 ENCOUNTER — Emergency Department (INDEPENDENT_AMBULATORY_CARE_PROVIDER_SITE_OTHER)
Admission: EM | Admit: 2014-10-02 | Discharge: 2014-10-02 | Disposition: A | Discharge status: Home / Self Care | Payer: Self-pay | Source: Home / Self Care | Attending: Family Medicine | Admitting: Family Medicine

## 2014-10-02 ENCOUNTER — Encounter (HOSPITAL_COMMUNITY): Payer: Self-pay | Admitting: Emergency Medicine

## 2014-10-02 DIAGNOSIS — H01006 Unspecified blepharitis left eye, unspecified eyelid: Secondary | ICD-10-CM

## 2014-10-02 DIAGNOSIS — J340 Abscess, furuncle and carbuncle of nose: Secondary | ICD-10-CM

## 2014-10-02 MED ORDER — MINOCYCLINE HCL 100 MG PO CAPS: 100.0000 mg | ORAL_CAPSULE | Freq: Two times a day (BID) | ORAL | Status: DC

## 2014-10-02 MED ORDER — TOBRAMYCIN 0.3 % OP OINT: 1.0000 "application " | TOPICAL_OINTMENT | Freq: Three times a day (TID) | OPHTHALMIC | Status: DC

## 2014-10-02 MED ORDER — MUPIROCIN 2 % EX OINT: 1.0000 "application " | TOPICAL_OINTMENT | Freq: Three times a day (TID) | CUTANEOUS | Status: DC

## 2014-10-02 NOTE — ED Notes (Signed)
C/o small sore on right nare, below right eye lid and poss stye on left upper eye lid onset 3 days Reports she is getting over a cold Alert, no signs of acute distress.

## 2014-10-02 NOTE — Discharge Instructions (Signed)
Warm compress three times a day to eye lid and side of nose when you take the antibiotic, take all of medicine, return as needed.

## 2014-10-02 NOTE — ED Provider Notes (Signed)
CSN: [id number]     Arrival date & time 10/02/14  1749 History   First MD Initiated Contact with Patient 10/02/14 1825     Chief Complaint  Patient presents with  . Stye   (Consider location/radiation/quality/duration/timing/severity/associated sxs/prior Treatment) Patient is a 33 y.o. female presenting with eye problem. The history is provided by the patient.  Eye Problem Location:  L eye Quality:  Sharp Severity:  Mild Onset quality:  Gradual Duration:  2 days Progression:  Unchanged Chronicity:  New Associated symptoms: crusting   Associated symptoms comment:  Right nares crusting.   Past Medical History  Diagnosis Date  . Gastric ulcer   . Migraine   . GERD (gastroesophageal reflux disease)   . Gout   . Asthma     since baby  . Depression 2003Dx   Past Surgical History  Procedure Laterality Date  . Wisdom tooth extraction  2008    x4   Family History  Problem Relation Age of Onset  . Hypertension Mother   . Hyperlipidemia Mother   . Sudden death Father   . Hypertension Father   . Sudden death Brother   . Hypertension Brother   . Hyperlipidemia Brother   . Heart attack Brother   . Diabetes Neg Hx    History  Substance Use Topics  . Smoking status: Former Smoker -- 0.50 packs/day for 15.5 years    Types: Cigarettes    Quit date: 03/02/2013  . Smokeless tobacco: Not on file  . Alcohol Use: No   OB History    No data available     Review of Systems  Constitutional: Negative.   Eyes: Positive for pain.    Allergies  Review of patient's allergies indicates no known allergies.  Home Medications   Prior to Admission medications   Medication Sig Start Date End Date Taking? Authorizing Provider  allopurinol (ZYLOPRIM) 100 MG tablet Take 1 tablet (100 mg total) by mouth daily. 09/14/14  Yes Ambrose FinlandValerie A Keck, NP  amLODipine (NORVASC) 5 MG tablet Take 1 tablet (5 mg total) by mouth daily. 07/05/14  Yes Ambrose FinlandValerie A Keck, NP  ipratropium (ATROVENT) 0.06 %  nasal spray Place 2 sprays into both nostrils 4 (four) times daily. 12/11/13  Yes Rodolph BongEvan S Corey, MD  acetaminophen (TYLENOL) 160 MG/5ML suspension Take 960 mg by mouth every 6 (six) hours as needed (for pain.).    Historical Provider, MD  amoxicillin-clavulanate (AUGMENTIN) 875-125 MG per tablet Take 1 tablet by mouth 2 (two) times daily. 06/19/14   Arman FilterGail K Schulz, NP  aspirin-acetaminophen-caffeine (EXCEDRIN MIGRAINE) [id number] MG per tablet Take 1 tablet by mouth every 6 (six) hours as needed. For headache     Historical Provider, MD  B Complex-Biotin-FA (B-COMPLEX PO) Take 1 tablet by mouth daily.      Historical Provider, MD  cetirizine (ZYRTEC) 10 MG tablet Take 10 mg by mouth daily.    Historical Provider, MD  diclofenac sodium (VOLTAREN) 1 % GEL Apply 4 g topically 4 (four) times daily. 07/05/14   Ambrose FinlandValerie A Keck, NP  doxycycline (VIBRA-TABS) 100 MG tablet Take 1 tablet (100 mg total) by mouth 2 (two) times daily. 07/05/14   Ambrose FinlandValerie A Keck, NP  fluconazole (DIFLUCAN) 150 MG tablet Take 1 tablet (150 mg total) by mouth once. 07/05/14   Ambrose FinlandValerie A Keck, NP  furosemide (LASIX) 40 MG tablet Take 40 mg by mouth daily.    Historical Provider, MD  minocycline (MINOCIN,DYNACIN) 100 MG capsule Take 1 capsule (100 mg  total) by mouth 2 (two) times daily. 10/02/14   Linna HoffJames D Dorethy Tomey, MD  mupirocin ointment (BACTROBAN) 2 % Place 1 application into the nose 3 (three) times daily. 10/02/14   Linna HoffJames D Bladen Umar, MD  omeprazole (PRILOSEC) 40 MG capsule Take 40 mg by mouth daily.      Historical Provider, MD  potassium chloride (K-DUR) 10 MEQ tablet Take 10 mEq by mouth daily.    Historical Provider, MD  predniSONE (DELTASONE) 10 MG tablet Take 1 tablet (10 mg total) by mouth daily with breakfast. 07/05/14   Ambrose FinlandValerie A Keck, NP  Pseudoephedrine-Acetaminophen (SINUS APAP MAXIMUM STRENGTH PO) Take 2 tablets by mouth every 6 (six) hours as needed (for sinus pain).    Historical Provider, MD  tobramycin (TOBREX) 0.3 % ophthalmic  ointment Place 1 application into the left eye 3 (three) times daily. After warm soak to lid. 10/02/14   Linna HoffJames D Ramina Hulet, MD  traZODone (DESYREL) 50 MG tablet Take 0.5 tablets (25 mg total) by mouth at bedtime as needed for sleep. 07/05/14   Ambrose FinlandValerie A Keck, NP   LMP 09/29/2014 Physical Exam  Constitutional: She is oriented to person, place, and time. She appears well-developed and well-nourished.  HENT:  Tender crusting lesion to right nares, no vesicles.  Eyes: Conjunctivae are normal. Pupils are equal, round, and reactive to light. Left eye exhibits discharge.  Crusting lesion on left upper lid margin, sl erythema  Neck: Normal range of motion. Neck supple.  Lymphadenopathy:    She has no cervical adenopathy.  Neurological: She is alert and oriented to person, place, and time.  Skin: Skin is warm and dry.  Nursing note and vitals reviewed.   ED Course  Procedures (including critical care time) Labs Review Labs Reviewed - No data to display  Imaging Review No results found.   MDM   1. Blepharitis of eyelid of left eye   2. Cellulitis of nasal tip       Linna HoffJames D Naleah Kofoed, MD 10/02/14 2023

## 2014-10-03 ENCOUNTER — Telehealth: Payer: Self-pay | Admitting: Emergency Medicine

## 2014-10-03 NOTE — Telephone Encounter (Signed)
Left message for pt to call nurse line when message received 

## 2014-10-20 ENCOUNTER — Other Ambulatory Visit: Payer: Self-pay | Admitting: Internal Medicine

## 2014-11-13 ENCOUNTER — Ambulatory Visit: Payer: Self-pay | Admitting: Internal Medicine

## 2014-12-28 ENCOUNTER — Encounter (HOSPITAL_COMMUNITY): Payer: Self-pay | Admitting: Emergency Medicine

## 2014-12-28 ENCOUNTER — Emergency Department (HOSPITAL_COMMUNITY)
Admission: EM | Admit: 2014-12-28 | Discharge: 2014-12-28 | Disposition: A | Discharge status: Home / Self Care | Payer: 59 | Source: Home / Self Care | Attending: Family Medicine | Admitting: Family Medicine

## 2014-12-28 DIAGNOSIS — G43019 Migraine without aura, intractable, without status migrainosus: Secondary | ICD-10-CM

## 2014-12-28 MED ORDER — METOCLOPRAMIDE HCL 5 MG/ML IJ SOLN
5.0000 mg | Freq: Once | INTRAMUSCULAR | Status: AC
  Administered 2014-12-28: 5 mg via INTRAMUSCULAR

## 2014-12-28 MED ORDER — DICLOFENAC SODIUM 50 MG PO TBEC: 50.0000 mg | DELAYED_RELEASE_TABLET | Freq: Two times a day (BID) | ORAL | Status: DC | PRN

## 2014-12-28 MED ORDER — KETOROLAC TROMETHAMINE 60 MG/2ML IM SOLN
60.0000 mg | Freq: Once | INTRAMUSCULAR | Status: AC
  Administered 2014-12-28: 60 mg via INTRAMUSCULAR

## 2014-12-28 MED ORDER — DEXAMETHASONE SODIUM PHOSPHATE 10 MG/ML IJ SOLN
INTRAMUSCULAR | Status: AC
  Filled 2014-12-28: qty 1

## 2014-12-28 MED ORDER — METOCLOPRAMIDE HCL 5 MG/ML IJ SOLN
INTRAMUSCULAR | Status: AC
  Filled 2014-12-28: qty 2

## 2014-12-28 MED ORDER — DEXAMETHASONE SODIUM PHOSPHATE 10 MG/ML IJ SOLN
10.0000 mg | Freq: Once | INTRAMUSCULAR | Status: AC
  Administered 2014-12-28: 10 mg via INTRAMUSCULAR

## 2014-12-28 MED ORDER — KETOROLAC TROMETHAMINE 60 MG/2ML IM SOLN
INTRAMUSCULAR | Status: AC
  Filled 2014-12-28: qty 2

## 2014-12-28 NOTE — ED Provider Notes (Signed)
Catherine [name] is a 34 y.o. female who presents to Urgent Care today for headache facial pain and pressure and photophobia. Symptoms present for 3 days. Symptoms consistent with previous episodes of migraine. No weakness numbness bowel bladder dysfunction or difficulty walking. Patient also noted some cervical spine pain. She's tried some Excedrin which did not help much. No fevers or chills vomiting or diarrhea. No weakness or numbness or loss of function.   Past Medical History  Diagnosis Date  . Gastric ulcer   . Migraine   . GERD (gastroesophageal reflux disease)   . Gout   . Asthma     since baby  . Depression 2003Dx   Past Surgical History  Procedure Laterality Date  . Wisdom tooth extraction  2008    x4   History  Substance Use Topics  . Smoking status: Former Smoker -- 0.50 packs/day for 15.5 years    Types: Cigarettes    Quit date: 03/02/2013  . Smokeless tobacco: Not on file  . Alcohol Use: No   ROS as above Medications: Current Facility-Administered Medications  Medication Dose Route Frequency Provider Last Rate Last Dose  . dexamethasone (DECADRON) injection 10 mg  10 mg Intramuscular Once Rodolph BongEvan S Sahra Converse, MD      . ketorolac (TORADOL) injection 60 mg  60 mg Intramuscular Once Rodolph BongEvan S Tinleigh Whitmire, MD      . metoCLOPramide (REGLAN) injection 5 mg  5 mg Intramuscular Once Rodolph BongEvan S Emanuelle Bastos, MD       Current Outpatient Prescriptions  Medication Sig Dispense Refill  . allopurinol (ZYLOPRIM) 100 MG tablet TAKE 1 TABLET BY MOUTH DAILY. 30 tablet 0  . amLODipine (NORVASC) 5 MG tablet Take 1 tablet (5 mg total) by mouth daily. 30 tablet 3  . cetirizine (ZYRTEC) 10 MG tablet Take 10 mg by mouth daily.    . furosemide (LASIX) 40 MG tablet Take 40 mg by mouth daily.    Marland Kitchen. acetaminophen (TYLENOL) 160 MG/5ML suspension Take 960 mg by mouth every 6 (six) hours as needed (for pain.).    Marland Kitchen. amoxicillin-clavulanate (AUGMENTIN) 875-125 MG per tablet Take 1 tablet by mouth 2 (two) times daily. 13  tablet 0  . aspirin-acetaminophen-caffeine (EXCEDRIN MIGRAINE) 250-250-65 MG per tablet Take 1 tablet by mouth every 6 (six) hours as needed. For headache     . B Complex-Biotin-FA (B-COMPLEX PO) Take 1 tablet by mouth daily.      . diclofenac (VOLTAREN) 50 MG EC tablet Take 1 tablet (50 mg total) by mouth 2 (two) times daily as needed. 30 tablet 0  . diclofenac sodium (VOLTAREN) 1 % GEL Apply 4 g topically 4 (four) times daily. 100 g 3  . doxycycline (VIBRA-TABS) 100 MG tablet Take 1 tablet (100 mg total) by mouth 2 (two) times daily. 20 tablet 0  . fluconazole (DIFLUCAN) 150 MG tablet Take 1 tablet (150 mg total) by mouth once. 1 tablet 0  . furosemide (LASIX) 40 MG tablet TAKE 1 TABLET BY MOUTH ONCE DAILY 30 tablet 2  . ipratropium (ATROVENT) 0.06 % nasal spray Place 2 sprays into both nostrils 4 (four) times daily. 15 mL 1  . minocycline (MINOCIN,DYNACIN) 100 MG capsule Take 1 capsule (100 mg total) by mouth 2 (two) times daily. 20 capsule 0  . mupirocin ointment (BACTROBAN) 2 % Place 1 application into the nose 3 (three) times daily. 22 g 0  . omeprazole (PRILOSEC) 40 MG capsule Take 40 mg by mouth daily.      . potassium  chloride (K-DUR) 10 MEQ tablet Take 10 mEq by mouth daily.    . predniSONE (DELTASONE) 10 MG tablet Take 1 tablet (10 mg total) by mouth daily with breakfast. 5 tablet 0  . Pseudoephedrine-Acetaminophen (SINUS APAP MAXIMUM STRENGTH PO) Take 2 tablets by mouth every 6 (six) hours as needed (for sinus pain).    Marland Kitchen tobramycin (TOBREX) 0.3 % ophthalmic ointment Place 1 application into the left eye 3 (three) times daily. After warm soak to lid. 3.5 g 0  . traZODone (DESYREL) 50 MG tablet Take 0.5 tablets (25 mg total) by mouth at bedtime as needed for sleep. 30 tablet 1   No Known Allergies   Exam:  BP 129/91 mmHg  Pulse 71  Temp(Src) 97.6 F (36.4 C) (Oral)  Resp 18  SpO2 98%  LMP 12/21/2014 Gen: Well NAD morbidly obese HEENT: EOMI,  MMM PERRLA. Sinuses nontender.  Clear nasal discharge. Normal posterior pharynx Lungs: Normal work of breathing. CTABL Heart: RRR no MRG Abd: NABS, Soft. Nondistended, Nontender Exts: Brisk capillary refill, warm and well perfused.  Neuro: Alert and oriented cranial nerves II through XII are intact normal reflexes strength and sensation.  Patient was given 60 mg IM Toradol, 10 mg IM dexamethasone, 5 mg IM Reglan and felt better  No results found for this or any previous visit (from the past 24 hour(s)). No results found.  Assessment and Plan: 34 y.o. female with headache. Treat with medicines as noted above. Use oral diclofenac. Follow-up with PCP.  Discussed warning signs or symptoms. Please see discharge instructions. Patient expresses understanding.     Rodolph Bong, MD 12/28/14 1146

## 2014-12-28 NOTE — ED Notes (Signed)
C/o HA, nauseas, and upper back pain onset 3 days Also reports HBP; yset BP = 155/105 Alert, no signs of acute distress.

## 2014-12-28 NOTE — Discharge Instructions (Signed)
Thank you for coming in today. °Go to the emergency room if your headache becomes excruciating or you have weakness or numbness or uncontrolled vomiting.  ° ° °Migraine Headache °A migraine headache is an intense, throbbing pain on one or both sides of your head. A migraine can last for 30 minutes to several hours. °CAUSES  °The exact cause of a migraine headache is not always known. However, a migraine may be caused when nerves in the brain become irritated and release chemicals that cause inflammation. This causes pain. °Certain things may also trigger migraines, such as: °· Alcohol. °· Smoking. °· Stress. °· Menstruation. °· Aged cheeses. °· Foods or drinks that contain nitrates, glutamate, aspartame, or tyramine. °· Lack of sleep. °· Chocolate. °· Caffeine. °· Hunger. °· Physical exertion. °· Fatigue. °· Medicines used to treat chest pain (nitroglycerine), birth control pills, estrogen, and some blood pressure medicines. °SIGNS AND SYMPTOMS °· Pain on one or both sides of your head. °· Pulsating or throbbing pain. °· Severe pain that prevents daily activities. °· Pain that is aggravated by any physical activity. °· Nausea, vomiting, or both. °· Dizziness. °· Pain with exposure to bright lights, loud noises, or activity. °· General sensitivity to bright lights, loud noises, or smells. °Before you get a migraine, you may get warning signs that a migraine is coming (aura). An aura may include: °· Seeing flashing lights. °· Seeing bright spots, halos, or zigzag lines. °· Having tunnel vision or blurred vision. °· Having feelings of numbness or tingling. °· Having trouble talking. °· Having muscle weakness. °DIAGNOSIS  °A migraine headache is often diagnosed based on: °· Symptoms. °· Physical exam. °· A CT scan or MRI of your head. These imaging tests cannot diagnose migraines, but they can help rule out other causes of headaches. °TREATMENT °Medicines may be given for pain and nausea. Medicines can also be given to  help prevent recurrent migraines.  °HOME CARE INSTRUCTIONS °· Only take over-the-counter or prescription medicines for pain or discomfort as directed by your health care provider. The use of long-term narcotics is not recommended. °· Lie down in a dark, quiet room when you have a migraine. °· Keep a journal to find out what may trigger your migraine headaches. For example, write down: °¨ What you eat and drink. °¨ How much sleep you get. °¨ Any change to your diet or medicines. °· Limit alcohol consumption. °· Quit smoking if you smoke. °· Get 7-9 hours of sleep, or as recommended by your health care provider. °· Limit stress. °· Keep lights dim if bright lights bother you and make your migraines worse. °SEEK IMMEDIATE MEDICAL CARE IF:  °· Your migraine becomes severe. °· You have a fever. °· You have a stiff neck. °· You have vision loss. °· You have muscular weakness or loss of muscle control. °· You start losing your balance or have trouble walking. °· You feel faint or pass out. °· You have severe symptoms that are different from your first symptoms. °MAKE SURE YOU:  °· Understand these instructions. °· Will watch your condition. °· Will get help right away if you are not doing well or get worse. °Document Released: 09/29/2005 Document Revised: 02/13/2014 Document Reviewed: 06/06/2013 °[employer]® Patient Information ©2015 [employer]. This information is not intended to replace advice given to you by your health care provider. Make sure you discuss any questions you have with your health care provider. ° °

## 2015-02-15 ENCOUNTER — Other Ambulatory Visit: Payer: Self-pay | Admitting: Internal Medicine

## 2015-02-15 MED ORDER — ALLOPURINOL 100 MG PO TABS: 100.0000 mg | ORAL_TABLET | Freq: Every day | ORAL | Status: DC

## 2015-04-30 ENCOUNTER — Other Ambulatory Visit: Payer: Self-pay | Admitting: Internal Medicine

## 2015-05-08 ENCOUNTER — Emergency Department (HOSPITAL_COMMUNITY): Payer: 59

## 2015-05-08 ENCOUNTER — Emergency Department (HOSPITAL_COMMUNITY)
Admission: EM | Admit: 2015-05-08 | Discharge: 2015-05-08 | Disposition: A | Discharge status: Home / Self Care | Payer: 59 | Attending: Emergency Medicine | Admitting: Emergency Medicine

## 2015-05-08 ENCOUNTER — Encounter (HOSPITAL_COMMUNITY): Payer: Self-pay | Admitting: Emergency Medicine

## 2015-05-08 DIAGNOSIS — G43909 Migraine, unspecified, not intractable, without status migrainosus: Secondary | ICD-10-CM | POA: Diagnosis not present

## 2015-05-08 DIAGNOSIS — Z7982 Long term (current) use of aspirin: Secondary | ICD-10-CM | POA: Insufficient documentation

## 2015-05-08 DIAGNOSIS — R0602 Shortness of breath: Secondary | ICD-10-CM | POA: Diagnosis present

## 2015-05-08 DIAGNOSIS — R079 Chest pain, unspecified: Secondary | ICD-10-CM | POA: Insufficient documentation

## 2015-05-08 DIAGNOSIS — Z79899 Other long term (current) drug therapy: Secondary | ICD-10-CM | POA: Insufficient documentation

## 2015-05-08 DIAGNOSIS — F329 Major depressive disorder, single episode, unspecified: Secondary | ICD-10-CM | POA: Insufficient documentation

## 2015-05-08 DIAGNOSIS — Z791 Long term (current) use of non-steroidal anti-inflammatories (NSAID): Secondary | ICD-10-CM | POA: Insufficient documentation

## 2015-05-08 DIAGNOSIS — Z87891 Personal history of nicotine dependence: Secondary | ICD-10-CM | POA: Diagnosis not present

## 2015-05-08 DIAGNOSIS — J45901 Unspecified asthma with (acute) exacerbation: Secondary | ICD-10-CM | POA: Insufficient documentation

## 2015-05-08 DIAGNOSIS — M109 Gout, unspecified: Secondary | ICD-10-CM | POA: Insufficient documentation

## 2015-05-08 DIAGNOSIS — K219 Gastro-esophageal reflux disease without esophagitis: Secondary | ICD-10-CM | POA: Insufficient documentation

## 2015-05-08 LAB — BASIC METABOLIC PANEL
Anion gap: 8 (ref 5–15)
BUN: 10 mg/dL (ref 6–20)
CO2: 24 mmol/L (ref 22–32)
Calcium: 9.5 mg/dL (ref 8.9–10.3)
Chloride: 105 mmol/L (ref 101–111)
Creatinine, Ser: 0.88 mg/dL (ref 0.44–1.00)
GFR calc Af Amer: >60 mL/min (ref >60)
GFR calc non Af Amer: >60 mL/min (ref >60)
Glucose, Bld: 88 mg/dL (ref 65–99)
Potassium: 3.8 mmol/L (ref 3.5–5.1)
Sodium: 137 mmol/L (ref 135–145)

## 2015-05-08 LAB — CBC
HCT: 40.4 % (ref 36.0–46.0)
Hemoglobin: 13.6 g/dL (ref 12.0–15.0)
MCH: 29.3 pg (ref 26.0–34.0)
MCHC: 33.7 g/dL (ref 30.0–36.0)
MCV: 87.1 fL (ref 78.0–100.0)
Platelets: 258 10*3/uL (ref 150–400)
RBC: 4.64 MIL/uL (ref 3.87–5.11)
RDW: 14.1 % (ref 11.5–15.5)
WBC: 11.7 10*3/uL — ABNORMAL HIGH (ref 4.0–10.5)

## 2015-05-08 LAB — I-STAT TROPONIN, ED: Troponin i, poc: 0 ng/mL (ref 0.00–0.08)

## 2015-05-08 NOTE — Discharge Instructions (Signed)
Shortness of Breath °Shortness of breath means you have trouble breathing. Shortness of breath needs medical care right away. °HOME CARE  °· Do not smoke. °· Avoid being around chemicals or things (paint fumes, dust) that may bother your breathing. °· Rest as needed. Slowly begin your normal activities. °· Only take medicines as told by your doctor. °· Keep all doctor visits as told. °GET HELP RIGHT AWAY IF:  °· Your shortness of breath gets worse. °· You feel lightheaded, pass out (faint), or have a cough that is not helped by medicine. °· You cough up blood. °· You have pain with breathing. °· You have pain in your chest, arms, shoulders, or belly (abdomen). °· You have a fever. °· You cannot walk up stairs or exercise the way you normally do. °· You do not get better in the time expected. °· You have a hard time doing normal activities even with rest. °· You have problems with your medicines. °· You have any new symptoms. °MAKE SURE YOU: °· Understand these instructions. °· Will watch your condition. °· Will get help right away if you are not doing well or get worse. °Document Released: 03/17/2008 Document Revised: 10/04/2013 Document Reviewed: 12/15/2011 °[employer]® Patient Information ©2015 [employer]. This information is not intended to replace advice given to you by your health care provider. Make sure you discuss any questions you have with your health care provider. ° °

## 2015-05-08 NOTE — ED Notes (Signed)
To ED from work with c/o chest pain started while walking at work-- no different activity than normal. Pt is short of breath with novement

## 2015-05-08 NOTE — ED Provider Notes (Signed)
CSN: [id number]     Arrival date & time 05/08/15  0745 History   First MD Initiated Contact with Patient 05/08/15 [phone]     Chief Complaint  Patient presents with  . Chest Pain  . Shortness of Breath     (Consider location/radiation/quality/duration/timing/severity/associated sxs/prior Treatment) HPI  Catherine [name] is a 34yo morbidly obese female with gout, migraines, GERD, and bipolar disorder who presents with shortness of breath and chest tightness that started this morning. She was walking at work at her normal activity level and became short of breath with associated band-like tightness across her bilateral chest. She rested, drank some water, felt better, but then her symptoms came back when she tried walking again, this time with radiation of her chest tightness into her upper back. She still is having some mild shortness of breath and tightness in her left chest, but she feels her symptoms are better now that she is resting in a bed here. She does say she has had a cough productive of yellowish sputum over the last 3 days. She has had no associated fevers, headache, lightheadedness, diaphoresis, palpitations, abdominal pain, changes in bowel or urinary function, leg swelling, or any other symptoms.  Past Medical History  Diagnosis Date  . Gastric ulcer   . Migraine   . GERD (gastroesophageal reflux disease)   . Gout   . Asthma     since baby  . Depression 2003Dx   Past Surgical History  Procedure Laterality Date  . Wisdom tooth extraction  2008    x4   Family History  Problem Relation Age of Onset  . Hypertension Mother   . Hyperlipidemia Mother   . Sudden death Father   . Hypertension Father   . Sudden death Brother   . Hypertension Brother   . Hyperlipidemia Brother   . Heart attack Brother   . Diabetes Neg Hx    History  Substance Use Topics  . Smoking status: Former Smoker -- 0.50 packs/day for 15.5 years    Types: Cigarettes    Quit date: 03/02/2013  .  Smokeless tobacco: Not on file  . Alcohol Use: No   OB History    No data available     Review of Systems  Constitutional: Negative for fever, chills and diaphoresis.  HENT: Negative for ear pain, sinus pressure and sore throat.   Eyes: Negative for visual disturbance.  Respiratory: Positive for chest tightness and shortness of breath. Negative for cough and wheezing.   Cardiovascular: Negative for chest pain, palpitations and leg swelling.  Gastrointestinal: Negative for nausea, vomiting, abdominal pain, diarrhea, constipation and blood in stool.  Endocrine: Negative for polyuria.  Genitourinary: Negative for dysuria, urgency, frequency, flank pain, decreased urine volume and difficulty urinating.  Musculoskeletal: Negative for back pain and gait problem.  Skin: Negative for rash.  Neurological: Negative for dizziness, syncope, weakness, numbness and headaches.  Hematological: Does not bruise/bleed easily.  Psychiatric/Behavioral: Negative for confusion and agitation.  All other systems reviewed and are negative.     Allergies  Review of patient's allergies indicates no known allergies.  Home Medications   Prior to Admission medications   Medication Sig Start Date End Date Taking? Authorizing Provider  allopurinol (ZYLOPRIM) 100 MG tablet Take 1 tablet (100 mg total) by mouth daily. 02/15/15  Yes Quentin Angst, MD  amLODipine (NORVASC) 5 MG tablet Take 1 tablet (5 mg total) by mouth daily. 07/05/14  Yes [name], NP  aspirin-acetaminophen-caffeine (EXCEDRIN MIGRAINE) [phone] MG  per tablet Take 1 tablet by mouth 2 (two) times daily as needed for migraine. For headache   Yes Historical Provider, MD  B Complex-Biotin-FA (B-COMPLEX PO) Take 1 tablet by mouth daily.     Yes Historical Provider, MD  cetirizine (ZYRTEC) 10 MG tablet Take 10 mg by mouth daily.   Yes Historical Provider, MD  diclofenac sodium (VOLTAREN) 1 % GEL Apply 4 g topically 4 (four) times daily.  07/05/14  Yes [name], NP  furosemide (LASIX) 40 MG tablet TAKE 1 TABLET BY MOUTH ONCE DAILY 10/20/14  Yes Quentin Angst, MD  ibuprofen (ADVIL,MOTRIN) 200 MG tablet Take 200 mg by mouth every 6 (six) hours as needed for moderate pain.   Yes Historical Provider, MD  multivitamin-iron-minerals-folic acid (CENTRUM) chewable tablet Chew 1 tablet by mouth daily.   Yes Historical Provider, MD  omeprazole (PRILOSEC) 40 MG capsule Take 40 mg by mouth daily.     Yes Historical Provider, MD  potassium chloride (K-DUR) 10 MEQ tablet Take 10 mEq by mouth daily.   Yes Historical Provider, MD  traZODone (DESYREL) 50 MG tablet Take 0.5 tablets (25 mg total) by mouth at bedtime as needed for sleep. 07/05/14  Yes [name], NP  amLODipine (NORVASC) 5 MG tablet TAKE 1 TABLET BY MOUTH DAILY. Patient not taking: Reported on 05/08/2015 05/01/15   [name], NP  amoxicillin-clavulanate (AUGMENTIN) 875-125 MG per tablet Take 1 tablet by mouth 2 (two) times daily. Patient not taking: Reported on 05/08/2015 06/19/14   Earley Favor, NP  diclofenac (VOLTAREN) 50 MG EC tablet Take 1 tablet (50 mg total) by mouth 2 (two) times daily as needed. Patient not taking: Reported on 05/08/2015 12/28/14   Rodolph Bong, MD  doxycycline (VIBRA-TABS) 100 MG tablet Take 1 tablet (100 mg total) by mouth 2 (two) times daily. Patient not taking: Reported on 05/08/2015 07/05/14   [name], NP  fluconazole (DIFLUCAN) 150 MG tablet Take 1 tablet (150 mg total) by mouth once. Patient not taking: Reported on 05/08/2015 07/05/14   [name], NP  ipratropium (ATROVENT) 0.06 % nasal spray Place 2 sprays into both nostrils 4 (four) times daily. Patient not taking: Reported on 05/08/2015 12/11/13   Rodolph Bong, MD  minocycline (MINOCIN,DYNACIN) 100 MG capsule Take 1 capsule (100 mg total) by mouth 2 (two) times daily. Patient not taking: Reported on 05/08/2015 10/02/14   Linna Hoff, MD  mupirocin ointment (BACTROBAN) 2 % Place 1  application into the nose 3 (three) times daily. Patient not taking: Reported on 05/08/2015 10/02/14   Linna Hoff, MD  predniSONE (DELTASONE) 10 MG tablet Take 1 tablet (10 mg total) by mouth daily with breakfast. Patient not taking: Reported on 05/08/2015 07/05/14   [name], NP  Pseudoephedrine-Acetaminophen (SINUS APAP MAXIMUM STRENGTH PO) Take 2 tablets by mouth every 6 (six) hours as needed (for sinus pain).    Historical Provider, MD  tobramycin (TOBREX) 0.3 % ophthalmic ointment Place 1 application into the left eye 3 (three) times daily. After warm soak to lid. Patient not taking: Reported on 05/08/2015 10/02/14   Linna Hoff, MD   BP 114/70 mmHg  Pulse 72  Temp(Src) 97.7 F (36.5 C) (Oral)  Resp 25  Ht  (1.727 m)  Wt 427 lb (193.686 kg)  BMI 64.94 kg/m2  SpO2 100%  LMP 04/10/2015 Physical Exam  Constitutional: She is oriented to person, place, and time. She appears well-nourished. No distress.  HENT:  Head: Normocephalic and atraumatic.  Right Ear: External ear normal.  Left Ear: External ear normal.  Eyes: Conjunctivae are normal. Pupils are equal, round, and reactive to light.  Neck: Normal range of motion. Neck supple.  Cardiovascular: Normal rate, regular rhythm, normal heart sounds and intact distal pulses.  Exam reveals no gallop and no friction rub.   No murmur heard. Pulmonary/Chest: Effort normal and breath sounds normal. She has no wheezes. She has no rales.  Abdominal: Soft. Bowel sounds are normal. She exhibits no distension. There is no tenderness.  Musculoskeletal: Normal range of motion. She exhibits no edema or tenderness.  Neurological: She is alert and oriented to person, place, and time. She has normal reflexes. No cranial nerve deficit. Coordination normal.  Skin: Skin is warm. No rash noted. She is not diaphoretic. No erythema.  Psychiatric: She has a normal mood and affect. Her behavior is normal.  Nursing note and vitals reviewed.   ED  Course  Procedures (including critical care time) Labs Review Labs Reviewed  CBC - Abnormal; Notable for the following:    WBC 11.7 (*)    All other components within normal limits  BASIC METABOLIC PANEL  I-STAT TROPOININ, ED    Imaging Review Dg Chest 2 View  05/08/2015   CLINICAL DATA:  Chest pain and shortness of breath.  EXAM: CHEST - 2 VIEW  COMPARISON:  09/07/2011  FINDINGS: The heart size and mediastinal contours are within normal limits. Mild interstitial and pulmonary vascular prominence may be consistent with early interstitial edema. There is no evidence of pulmonary consolidation, pneumothorax, nodule or pleural fluid. The visualized skeletal structures are unremarkable.  IMPRESSION: Potential mild interstitial edema.   Electronically Signed   By: Irish Lack M.D.   On: 05/08/2015 09:12     EKG Interpretation   Date/Time:  Tuesday May 08 2015 07:49:26 EDT Ventricular Rate:  72 PR Interval:  172 QRS Duration: 76 QT Interval:  394 QTC Calculation: 431 R Axis:   43 Text Interpretation:  Normal sinus rhythm Non specific T wave flattening  Confirmed by ZAVITZ  MD, JOSHUA ([id number]) on 05/08/2015 8:19:43 AM      MDM   Final diagnoses:  None    Catherine [name] is a 34yo morbidly obese female with gout, migraines, GERD, and bipolar disorder who presents with shortness of breath and chest tightness that started this morning. She is afebrile and hemodynamically stable, EKG normal, troponins negative, labs significant only for a WBC 11.7. CXR does show potential mild interstitial edema. Patient does take furosemide 40mg  daily but says she only takes it occasionally due to it making her urinate excessively. Shortness of breath is likely due to morbid obesity/deconditioning, with possible furosemide noncompliance as a factor. We will send patient home with follow-up to PCP regarding shortness of breath, possibly OSA or pulmonary hypertension components, as we feel patient is stable  for home.     Darrick Huntsman, MD 05/08/15 1034  Blane Ohara, MD 05/08/15 [phone]

## 2015-05-14 ENCOUNTER — Ambulatory Visit: Payer: 59 | Attending: Internal Medicine | Admitting: Internal Medicine

## 2015-05-14 ENCOUNTER — Encounter: Payer: Self-pay | Admitting: Internal Medicine

## 2015-05-14 VITALS — BP 117/78 | HR 90 | Temp 97.9°F | Resp 18 | Ht 68.0 in | Wt >= 6400 oz

## 2015-05-14 DIAGNOSIS — J45909 Unspecified asthma, uncomplicated: Secondary | ICD-10-CM | POA: Insufficient documentation

## 2015-05-14 DIAGNOSIS — M109 Gout, unspecified: Secondary | ICD-10-CM | POA: Diagnosis not present

## 2015-05-14 DIAGNOSIS — K219 Gastro-esophageal reflux disease without esophagitis: Secondary | ICD-10-CM | POA: Diagnosis not present

## 2015-05-14 DIAGNOSIS — Z87891 Personal history of nicotine dependence: Secondary | ICD-10-CM | POA: Diagnosis not present

## 2015-05-14 DIAGNOSIS — F329 Major depressive disorder, single episode, unspecified: Secondary | ICD-10-CM | POA: Diagnosis not present

## 2015-05-14 DIAGNOSIS — G4700 Insomnia, unspecified: Secondary | ICD-10-CM

## 2015-05-14 DIAGNOSIS — Z6841 Body Mass Index (BMI) 40.0 and over, adult: Secondary | ICD-10-CM | POA: Diagnosis not present

## 2015-05-14 DIAGNOSIS — Z79899 Other long term (current) drug therapy: Secondary | ICD-10-CM | POA: Diagnosis not present

## 2015-05-14 DIAGNOSIS — R609 Edema, unspecified: Secondary | ICD-10-CM | POA: Diagnosis present

## 2015-05-14 MED ORDER — POTASSIUM CHLORIDE ER 10 MEQ PO TBCR: 10.0000 meq | EXTENDED_RELEASE_TABLET | Freq: Every day | ORAL | Status: DC

## 2015-05-14 MED ORDER — OMEPRAZOLE 20 MG PO CPDR: 20.0000 mg | DELAYED_RELEASE_CAPSULE | Freq: Every day | ORAL | Status: DC

## 2015-05-14 MED ORDER — FUROSEMIDE 40 MG PO TABS: 40.0000 mg | ORAL_TABLET | Freq: Every day | ORAL | Status: DC

## 2015-05-14 MED ORDER — TRAZODONE HCL 50 MG PO TABS: 25.0000 mg | ORAL_TABLET | Freq: Every evening | ORAL | Status: DC | PRN

## 2015-05-14 NOTE — Progress Notes (Signed)
Patient ID: Catherine [name], female   DOB: 26-May-1981, 34 y.o.   MRN: [id number]  CC: SOB  HPI: Catherine [name] is a 34 yo morbidly obese female with gout, migraines, GERD, and bipolar disorder, and asthma. Patient reports that she was recently seen in ER last week for symptoms of SOB and chest tightness. Chest xray was performed that revealed mild interstitial edema. Patient has been prescribed Lasix but states that she only takes when needed because she does not like the side effects of excessive urination. Today she reports improvement in SOB and chest tightness since last week. She does still notice chest tightness with exertion. Since discharge she has been taking lasix daily. She reports that she has been on Lasix for 9-10 years d/t long term edema. She has never had a Echo to evaluate for CHF.  She currently uses her CPAP machine daily for sleep apnea. She reports that she has begun to start counting calories to help lose weight. She does not currently exercise.   Patient has No headache, No chest pain, No abdominal pain - No Nausea, No new weakness tingling or numbness, No Cough.  No Known Allergies Past Medical History  Diagnosis Date  . Gastric ulcer   . Migraine   . GERD (gastroesophageal reflux disease)   . Gout   . Asthma     since baby  . Depression 2003Dx   Current Outpatient Prescriptions on File Prior to Visit  Medication Sig Dispense Refill  . allopurinol (ZYLOPRIM) 100 MG tablet Take 1 tablet (100 mg total) by mouth daily. 30 tablet 3  . amLODipine (NORVASC) 5 MG tablet TAKE 1 TABLET BY MOUTH DAILY. (Patient not taking: Reported on 05/08/2015) 30 tablet 0  . aspirin-acetaminophen-caffeine (EXCEDRIN MIGRAINE) 250-250-65 MG per tablet Take 1 tablet by mouth 2 (two) times daily as needed for migraine. For headache    . B Complex-Biotin-FA (B-COMPLEX PO) Take 1 tablet by mouth daily.      . cetirizine (ZYRTEC) 10 MG tablet Take 10 mg by mouth daily.    . diclofenac sodium  (VOLTAREN) 1 % GEL Apply 4 g topically 4 (four) times daily. 100 g 3  . furosemide (LASIX) 40 MG tablet TAKE 1 TABLET BY MOUTH ONCE DAILY 30 tablet 2  . ibuprofen (ADVIL,MOTRIN) 200 MG tablet Take 200 mg by mouth every 6 (six) hours as needed for moderate pain.    Marland Kitchen ipratropium (ATROVENT) 0.06 % nasal spray Place 2 sprays into both nostrils 4 (four) times daily. (Patient not taking: Reported on 05/08/2015) 15 mL 1  . minocycline (MINOCIN,DYNACIN) 100 MG capsule Take 1 capsule (100 mg total) by mouth 2 (two) times daily. (Patient not taking: Reported on 05/08/2015) 20 capsule 0  . multivitamin-iron-minerals-folic acid (CENTRUM) chewable tablet Chew 1 tablet by mouth daily.    Marland Kitchen omeprazole (PRILOSEC) 40 MG capsule Take 40 mg by mouth daily.      . potassium chloride (K-DUR) 10 MEQ tablet Take 10 mEq by mouth daily.    . Pseudoephedrine-Acetaminophen (SINUS APAP MAXIMUM STRENGTH PO) Take 2 tablets by mouth every 6 (six) hours as needed (for sinus pain).    Marland Kitchen tobramycin (TOBREX) 0.3 % ophthalmic ointment Place 1 application into the left eye 3 (three) times daily. After warm soak to lid. (Patient not taking: Reported on 05/08/2015) 3.5 g 0  . traZODone (DESYREL) 50 MG tablet Take 0.5 tablets (25 mg total) by mouth at bedtime as needed for sleep. 30 tablet 1   No current  facility-administered medications on file prior to visit.   Family History  Problem Relation Age of Onset  . Hypertension Mother   . Hyperlipidemia Mother   . Sudden death Father   . Hypertension Father   . Sudden death Brother   . Hypertension Brother   . Hyperlipidemia Brother   . Heart attack Brother   . Diabetes Neg Hx    History   Social History  . Marital Status: Single    Spouse Name: N/A  . Number of Children: 3  . Years of Education: college    Occupational History  . developmental tech    Social History Main Topics  . Smoking status: Former Smoker -- 0.50 packs/day for 15.5 years    Types: Cigarettes    Quit  date: 03/02/2013  . Smokeless tobacco: Not on file  . Alcohol Use: No  . Drug Use: No  . Sexual Activity: No   Other Topics Concern  . Not on file   Social History Narrative    Review of Systems: See HPI  Objective:   Filed Vitals:   05/14/15 1617  BP: 117/78  Pulse: 90  Temp: 97.9 F (36.6 C)  Resp: 18    Physical Exam  Cardiovascular: Normal rate, regular rhythm and normal heart sounds.   Pulmonary/Chest: Effort normal and breath sounds normal. She has no wheezes. She has no rales.  Musculoskeletal: She exhibits no edema.     Lab Results  Component Value Date   WBC 11.7* 05/08/2015   HGB 13.6 05/08/2015   HCT 40.4 05/08/2015   MCV 87.1 05/08/2015   PLT 258 05/08/2015   Lab Results  Component Value Date   CREATININE 0.88 05/08/2015   BUN 10 05/08/2015   NA 137 05/08/2015   K 3.8 05/08/2015   CL 105 05/08/2015   CO2 24 05/08/2015    No results found for: HGBA1C Lipid Panel     Component Value Date/Time   CHOL 154 09/21/2013 1635   TRIG 61 09/21/2013 1635   HDL 41 09/21/2013 1635   CHOLHDL 3.8 09/21/2013 1635   VLDL 12 09/21/2013 1635   LDLCALC 101* 09/21/2013 1635       Assessment and plan:   Eden was seen today for hospitalization follow-up.  Diagnoses and all orders for this visit:  Interstitial edema Orders: -     Echocardiogram; Future -     Refill furosemide (LASIX) 40 MG tablet; Take 1 tablet (40 mg total) by mouth daily. -     Refill potassium chloride (K-DUR) 10 MEQ tablet; Take 1 tablet (10 mEq total) by mouth daily. Since edema has been a long term issue I will send patient for Echo to r/o CHF.   Obesity, morbid, BMI 50 or higher Orders: -     Amb Referral to Nutrition and Diabetic E I have given patient information to [facility] exercise groups, personal training, weight watchers, and [facility] weight loss center.  I have stressed how being severely obese is placing her at risk for multiple complications  Gastroesophageal  reflux disease, esophagitis presence not specified Orders: -    Refill omeprazole (PRILOSEC) 20 MG capsule; Take 1 capsule (20 mg total) by mouth daily.  Insomnia Orders: -     Refill traZODone (DESYREL) 50 MG tablet; Take 0.5 tablets (25 mg total) by mouth at bedtime as needed for sleep.   Return if symptoms worsen or fail to improve.       [name], NP-C [facility] [phone] 05/14/2015, 4:19 PM

## 2015-05-14 NOTE — Patient Instructions (Signed)
Exercise to Lose Weight Exercise and a healthy diet may help you lose weight. Your doctor may suggest specific exercises. EXERCISE IDEAS AND TIPS  Choose low-cost things you enjoy doing, such as walking, bicycling, or exercising to workout videos.  Take stairs instead of the elevator.  Walk during your lunch break.  Park your car further away from work or school.  Go to a gym or an exercise class.  Start with 5 to 10 minutes of exercise each day. Build up to 30 minutes of exercise 4 to 6 days a week.  Wear shoes with good support and comfortable clothes.  Stretch before and after working out.  Work out until you breathe harder and your heart beats faster.  Drink extra water when you exercise.  Do not do so much that you hurt yourself, feel dizzy, or get very short of breath. Exercises that burn about 150 calories:  Running 1  miles in 15 minutes.  Playing volleyball for 45 to 60 minutes.  Washing and waxing a car for 45 to 60 minutes.  Playing touch football for 45 minutes.  Walking 1  miles in 35 minutes.  Pushing a stroller 1  miles in 30 minutes.  Playing basketball for 30 minutes.  Raking leaves for 30 minutes.  Bicycling 5 miles in 30 minutes.  Walking 2 miles in 30 minutes.  Dancing for 30 minutes.  Shoveling snow for 15 minutes.  Swimming laps for 20 minutes.  Walking up stairs for 15 minutes.  Bicycling 4 miles in 15 minutes.  Gardening for 30 to 45 minutes.  Jumping rope for 15 minutes.  Washing windows or floors for 45 to 60 minutes. Document Released: 11/01/2010 Document Revised: 12/22/2011 Document Reviewed: 11/01/2010 [employer] Patient Information 2015 [employer]. This information is not intended to replace advice given to you by your health care provider. Make sure you discuss any questions you have with your health care provider.  

## 2015-05-14 NOTE — Progress Notes (Signed)
Patient in ED for shortness of breath and tightness in chest and back, last Tuesday. Xrays showed "a little bit of fluid on my lungs". Patient still having a little shortness of breath but not as bad as it was Tuesday. Chest was tight earlier when working but currently feels a little tight at level 2 on scale of 0-10.  Patient needs refill for potassium chloride, omeprazole, trazodone.  Patient has not had amlodipine for 1 week, refill at pharmacy.

## 2015-05-15 ENCOUNTER — Telehealth: Payer: Self-pay

## 2015-05-15 NOTE — Telephone Encounter (Signed)
Nurse called patient to make her aware of her Exho appointment at [facility] on May 21, 2015 at [facility], patient to arrive at 8:45am. Patient will need to go in at [facility] and go to Registration.

## 2015-05-16 ENCOUNTER — Other Ambulatory Visit (HOSPITAL_COMMUNITY): Payer: 59

## 2015-05-16 NOTE — Telephone Encounter (Signed)
Nurse called patient, patient verified date of birth. Patient aware of Echo appointment at [facility], on May 21, 2015 at [facility]. Patient agrees to arrive at 8:45am and go to [employer], Registration.  Patient voices understanding and has no further questions at this time.

## 2015-05-21 ENCOUNTER — Telehealth: Payer: Self-pay

## 2015-05-21 ENCOUNTER — Ambulatory Visit (HOSPITAL_COMMUNITY)
Admission: RE | Admit: 2015-05-21 | Discharge: 2015-05-21 | Disposition: A | Discharge status: Home / Self Care | Payer: 59 | Source: Ambulatory Visit | Attending: Internal Medicine | Admitting: Internal Medicine

## 2015-05-21 DIAGNOSIS — I071 Rheumatic tricuspid insufficiency: Secondary | ICD-10-CM | POA: Insufficient documentation

## 2015-05-21 DIAGNOSIS — R609 Edema, unspecified: Secondary | ICD-10-CM

## 2015-05-21 DIAGNOSIS — Z6841 Body Mass Index (BMI) 40.0 and over, adult: Secondary | ICD-10-CM | POA: Insufficient documentation

## 2015-05-21 DIAGNOSIS — I10 Essential (primary) hypertension: Secondary | ICD-10-CM | POA: Diagnosis not present

## 2015-05-21 DIAGNOSIS — I509 Heart failure, unspecified: Secondary | ICD-10-CM | POA: Diagnosis present

## 2015-05-21 MED ORDER — PERFLUTREN LIPID MICROSPHERE
INTRAVENOUS | Status: AC
  Filled 2015-05-21: qty 10

## 2015-05-21 NOTE — Telephone Encounter (Signed)
-----   Message from [name], NP sent at 05/21/2015  3:03 PM EDT ----- Normal echo. No CHF found

## 2015-05-21 NOTE — Telephone Encounter (Signed)
Patient not available Unable to leave message Phone not accepting incoming calls

## 2015-05-21 NOTE — Progress Notes (Signed)
Echocardiogram 2D Echocardiogram has been performed.  Catherine [name] 05/21/2015, 10:25 AM

## 2015-06-11 ENCOUNTER — Ambulatory Visit: Payer: 59 | Attending: Internal Medicine | Admitting: Internal Medicine

## 2015-06-11 ENCOUNTER — Encounter: Payer: Self-pay | Admitting: Internal Medicine

## 2015-06-11 VITALS — BP 136/83 | HR 90 | Temp 98.8°F | Resp 16 | Wt >= 6400 oz

## 2015-06-11 DIAGNOSIS — Z712 Person consulting for explanation of examination or test findings: Secondary | ICD-10-CM

## 2015-06-11 DIAGNOSIS — I10 Essential (primary) hypertension: Secondary | ICD-10-CM | POA: Diagnosis not present

## 2015-06-11 DIAGNOSIS — Z5181 Encounter for therapeutic drug level monitoring: Secondary | ICD-10-CM | POA: Diagnosis present

## 2015-06-11 DIAGNOSIS — Z7189 Other specified counseling: Secondary | ICD-10-CM | POA: Insufficient documentation

## 2015-06-11 NOTE — Progress Notes (Signed)
Patient here for follow up on her HTN and also wanting Her results of her echo

## 2015-06-11 NOTE — Progress Notes (Signed)
Patient ID: Catherine [name], female   DOB: [birth year], 34 y.o.   MRN: [id number]  CC: HTN and echo results   HPI: Catherine [name] is a 34 y.o. female here today for a follow up visit.  Patient has past medical history of  HTN and GERD. Patient reports that she has been taking lasix daily without complications. She is curious to know if she will need to begin back on amlodipine for hypertension. She is also requesting results of her Echocardiogram since she has had several years of BLE edema. Edema has improved since she has begun lasix.   No Known Allergies Past Medical History  Diagnosis Date  . Gastric ulcer   . Migraine   . GERD (gastroesophageal reflux disease)   . Gout   . Asthma     since baby  . Depression 2003Dx  . Hypertension    Current Outpatient Prescriptions on File Prior to Visit  Medication Sig Dispense Refill  . allopurinol (ZYLOPRIM) 100 MG tablet Take 1 tablet (100 mg total) by mouth daily. 30 tablet 3  . aspirin-acetaminophen-caffeine (EXCEDRIN MIGRAINE) 250-250-65 MG per tablet Take 1 tablet by mouth 2 (two) times daily as needed for migraine. For headache    . B Complex-Biotin-FA (B-COMPLEX PO) Take 1 tablet by mouth daily.      . cetirizine (ZYRTEC) 10 MG tablet Take 10 mg by mouth daily.    . diclofenac sodium (VOLTAREN) 1 % GEL Apply 4 g topically 4 (four) times daily. 100 g 3  . furosemide (LASIX) 40 MG tablet Take 1 tablet (40 mg total) by mouth daily. 30 tablet 5  . ibuprofen (ADVIL,MOTRIN) 200 MG tablet Take 200 mg by mouth every 6 (six) hours as needed for moderate pain.    . multivitamin-iron-minerals-folic acid (CENTRUM) chewable tablet Chew 1 tablet by mouth daily.    Marland Kitchen omeprazole (PRILOSEC) 20 MG capsule Take 1 capsule (20 mg total) by mouth daily. 30 capsule 6  . potassium chloride (K-DUR) 10 MEQ tablet Take 1 tablet (10 mEq total) by mouth daily. 30 tablet 5  . amLODipine (NORVASC) 5 MG tablet TAKE 1 TABLET BY MOUTH DAILY. (Patient not taking: Reported  on 05/14/2015) 30 tablet 0  . ipratropium (ATROVENT) 0.06 % nasal spray Place 2 sprays into both nostrils 4 (four) times daily. (Patient not taking: Reported on 06/11/2015) 15 mL 1  . minocycline (MINOCIN,DYNACIN) 100 MG capsule Take 1 capsule (100 mg total) by mouth 2 (two) times daily. (Patient not taking: Reported on 05/08/2015) 20 capsule 0  . Pseudoephedrine-Acetaminophen (SINUS APAP MAXIMUM STRENGTH PO) Take 2 tablets by mouth every 6 (six) hours as needed (for sinus pain).    Marland Kitchen tobramycin (TOBREX) 0.3 % ophthalmic ointment Place 1 application into the left eye 3 (three) times daily. After warm soak to lid. (Patient not taking: Reported on 05/08/2015) 3.5 g 0  . traZODone (DESYREL) 50 MG tablet Take 0.5 tablets (25 mg total) by mouth at bedtime as needed for sleep. 30 tablet 1   No current facility-administered medications on file prior to visit.   Family History  Problem Relation Age of Onset  . Hypertension Mother   . Hyperlipidemia Mother   . Sudden death Father   . Hypertension Father   . Sudden death Brother   . Hypertension Brother   . Hyperlipidemia Brother   . Heart attack Brother   . Diabetes Neg Hx    Social History   Social History  . Marital Status: Single  Spouse Name: N/A  . Number of Children: 3  . Years of Education: college    Occupational History  . developmental tech    Social History Main Topics  . Smoking status: Former Smoker -- 0.50 packs/day for 15.5 years    Types: Cigarettes    Quit date: 03/02/2013  . Smokeless tobacco: Not on file  . Alcohol Use: No  . Drug Use: No  . Sexual Activity: No   Other Topics Concern  . Not on file   Social History Narrative    Review of Systems: Other than what is stated in HPI, all other systems are negative.   Objective:   Filed Vitals:   06/11/15 1705  BP: 133/87  Pulse: 92  Temp: 98.8 F (37.1 C)  Resp: 16    Physical Exam  Constitutional:  Morbidly obese  Cardiovascular: Normal rate,  regular rhythm and normal heart sounds.   Pulmonary/Chest: Effort normal and breath sounds normal.  Musculoskeletal: She exhibits no edema.     Lab Results  Component Value Date   WBC 11.7* 05/08/2015   HGB 13.6 05/08/2015   HCT 40.4 05/08/2015   MCV 87.1 05/08/2015   PLT 258 05/08/2015   Lab Results  Component Value Date   CREATININE 0.88 05/08/2015   BUN 10 05/08/2015   NA 137 05/08/2015   K 3.8 05/08/2015   CL 105 05/08/2015   CO2 24 05/08/2015    No results found for: HGBA1C Lipid Panel     Component Value Date/Time   CHOL 154 09/21/2013 1635   TRIG 61 09/21/2013 1635   HDL 41 09/21/2013 1635   CHOLHDL 3.8 09/21/2013 1635   VLDL 12 09/21/2013 1635   LDLCALC 101* 09/21/2013 1635       Assessment and plan:   Catherine [name] was seen today for follow-up.  Diagnoses and all orders for this visit:  Therapeutic drug monitoring -     Basic Metabolic Panel  Will repeat the med due to the furosemide use.   Essential hypertension Blood pressure controlled on furosemide, patient does not need to take amlodipine at this time.   Encounter to discuss test results Echo results were negative, no CHF.   Return in about 4 weeks (around 07/09/2015) for Nurse Visit-BP check.        [name], NP-C [facility] [phone] 06/11/2015, 5:38 PM

## 2015-06-12 ENCOUNTER — Telehealth: Payer: Self-pay

## 2015-06-12 LAB — BASIC METABOLIC PANEL
BUN: 16 mg/dL (ref 7–25)
CO2: 24 mmol/L (ref 20–31)
Calcium: 9 mg/dL (ref 8.6–10.2)
Chloride: 107 mmol/L (ref 98–110)
Creat: 0.89 mg/dL (ref 0.50–1.10)
Glucose, Bld: 92 mg/dL (ref 65–99)
Potassium: 4.1 mmol/L (ref 3.5–5.3)
Sodium: 146 mmol/L (ref 135–146)

## 2015-06-12 NOTE — Telephone Encounter (Signed)
Attempted to call patient  Message states at the subscribers request phone does not Accept incoming calls

## 2015-06-12 NOTE — Telephone Encounter (Signed)
-----   Message from [name], NP sent at 06/12/2015 10:31 AM EDT ----- Let her know her potassium is normal. She is on the correct dose of potassium replacement. We will recheck in 2-3 months. Please continue to encourage her to lose weight.

## 2015-07-09 ENCOUNTER — Other Ambulatory Visit: Payer: Self-pay | Admitting: Internal Medicine

## 2015-07-09 ENCOUNTER — Ambulatory Visit: Payer: 59 | Attending: Internal Medicine

## 2015-07-09 VITALS — BP 96/65 | HR 76 | Temp 98.0°F | Resp 18 | Ht 68.0 in | Wt >= 6400 oz

## 2015-07-09 DIAGNOSIS — R609 Edema, unspecified: Secondary | ICD-10-CM

## 2015-07-09 DIAGNOSIS — R0981 Nasal congestion: Secondary | ICD-10-CM

## 2015-07-09 MED ORDER — FUROSEMIDE 20 MG PO TABS: 20.0000 mg | ORAL_TABLET | Freq: Every day | ORAL | Status: DC

## 2015-07-09 MED ORDER — MOMETASONE FUROATE 50 MCG/ACT NA SUSP: 2.0000 | Freq: Every day | NASAL | Status: DC

## 2015-07-09 NOTE — Progress Notes (Signed)
Patient here for BP check.  Patient states provider took her off amlodipine "going on 2 months" ago.  Per provider, decrease furosemide to  daily and order   Patient complains of left ear pain since Saturday, provider notified.

## 2015-09-20 ENCOUNTER — Telehealth: Payer: Self-pay | Admitting: Internal Medicine

## 2015-09-20 MED ORDER — CETIRIZINE HCL 10 MG PO TABS: 10.0000 mg | ORAL_TABLET | Freq: Every day | ORAL | Status: DC

## 2015-09-20 NOTE — Telephone Encounter (Signed)
Pt. Called requesting a med refill on cetirizine (ZYRTEC) 10 MG tablet. Please f/u with pt.

## 2015-09-26 ENCOUNTER — Ambulatory Visit: Payer: 59

## 2015-09-26 ENCOUNTER — Ambulatory Visit (INDEPENDENT_AMBULATORY_CARE_PROVIDER_SITE_OTHER): Payer: 59

## 2015-09-26 ENCOUNTER — Encounter: Payer: Self-pay | Admitting: Podiatry

## 2015-09-26 ENCOUNTER — Ambulatory Visit (INDEPENDENT_AMBULATORY_CARE_PROVIDER_SITE_OTHER): Payer: 59 | Admitting: Podiatry

## 2015-09-26 VITALS — BP 117/77 | HR 94 | Resp 16 | Ht 67.0 in | Wt >= 6400 oz

## 2015-09-26 DIAGNOSIS — M79672 Pain in left foot: Secondary | ICD-10-CM | POA: Diagnosis not present

## 2015-09-26 DIAGNOSIS — M79671 Pain in right foot: Secondary | ICD-10-CM | POA: Diagnosis not present

## 2015-09-26 DIAGNOSIS — L600 Ingrowing nail: Secondary | ICD-10-CM

## 2015-09-26 DIAGNOSIS — M722 Plantar fascial fibromatosis: Secondary | ICD-10-CM | POA: Diagnosis not present

## 2015-09-26 MED ORDER — DICLOFENAC SODIUM 75 MG PO TBEC: 75.0000 mg | DELAYED_RELEASE_TABLET | Freq: Two times a day (BID) | ORAL | Status: DC

## 2015-09-26 MED ORDER — TRIAMCINOLONE ACETONIDE 10 MG/ML IJ SUSP
10.0000 mg | Freq: Once | INTRAMUSCULAR | Status: AC
  Administered 2015-09-26: 10 mg

## 2015-09-26 NOTE — Patient Instructions (Signed)

## 2015-09-26 NOTE — Progress Notes (Signed)
   Subjective:    Patient ID: Catherine [name], female    DOB: 01-Oct-1981, 34 y.o.   MRN: [id number]  HPI Patient presents with bilateral foot pain; arch & center of foot. On Left foot-plantar forefoot; x2 months  Patient also presents with bilateral nail problem; nail discoloration and thickened nails. All nails need to be checked.  Review of Systems  HENT: Positive for sinus pressure.   All other systems reviewed and are negative.      Objective:   Physical Exam        Assessment & Plan:

## 2015-09-27 NOTE — Progress Notes (Signed)
Subjective:     Patient ID: Catherine [name], female   DOB: Jan 21, 1981, 34 y.o.   MRN: [id number]  HPI patient presents stating that she has exquisite discomfort in the right mid arch area and mild on the left and is not sure what may have caused it. She has extreme obesity which is certainly a complicating factor and has severe damage to her nailbeds hallux bilateral second right fifth left that she cannot cut and are becoming increasingly thicker   Review of Systems  All other systems reviewed and are negative.      Objective:   Physical Exam  Constitutional: She is oriented to person, place, and time.  Cardiovascular: Intact distal pulses.   Musculoskeletal: Normal range of motion.  Neurological: She is oriented to person, place, and time.  Skin: Skin is warm.  Nursing note and vitals reviewed.  neurovascular status found to be intact muscle strength adequate range of motion within normal limits with patient noted to have quite a bit of discomfort in the right mid arch area and mild discomfort in the left mid arch area. Patient is also noted to have severely thickened hallux nails bilateral second right fifth left that are painful when I pressed and they are getting loose secondary to the chronic trauma that they are exposed 2. She does have obesity which is a complicating factor and is noted to have good digital perfusion and is well oriented 3     Assessment:      inflammatory fasciitis right over left with nail disease of 4 nails that are chronic in nature and a failed to respond to previous treatments.    Plan:      H&P conditions reviewed and education rendered. She would like to have these nails removed and I do think it would be in her best interest given young age and severe thickness of the beds and I explained permanent procedures. Today I injected the mid arch area right 3 mg Kenalog 5 mg Xylocaine and applied fascial brace right with instructions on shoe gear modifications  and ice therapy. She'll be seen back in 2 weeks for nail removal and reevaluation of plantar fascial

## 2015-10-12 ENCOUNTER — Ambulatory Visit (INDEPENDENT_AMBULATORY_CARE_PROVIDER_SITE_OTHER): Payer: 59 | Admitting: Podiatry

## 2015-10-12 ENCOUNTER — Encounter: Payer: Self-pay | Admitting: Podiatry

## 2015-10-12 VITALS — BP 125/82 | HR 88 | Resp 12

## 2015-10-12 DIAGNOSIS — L600 Ingrowing nail: Secondary | ICD-10-CM

## 2015-10-12 DIAGNOSIS — M722 Plantar fascial fibromatosis: Secondary | ICD-10-CM

## 2015-10-12 NOTE — Progress Notes (Signed)
Subjective:     Patient ID: Catherine [name], female   DOB: Mar 30, 1981, 34 y.o.   MRN: [id number]  HPI patient states the heel and arch is doing pretty well and I want to have these nails removed but today I only want to get the big toenails removed and later on all to the other 2 nails   Review of Systems     Objective:   Physical Exam  neurovascular status intact muscle strength adequate with thickened hallux nails bilateral second right fifth left with hallux nails been especially disc tropic thickened and painful    Assessment:      damage nailbeds with symptomatic hallux bilateral second right fifth left    Plan:      reviewed condition and recommended removal of the nail and explained the condition and treatment to patient. At this time I infiltrated each hallux 60 mg Xylocaine Marcaine mixture remove the hallux nails exposed matrix and applied phenol 5 applications 30 seconds followed by alcohol lavage and sterile dressing. Gave instructions on soaks and reappoint

## 2015-10-12 NOTE — Patient Instructions (Signed)

## 2015-10-16 ENCOUNTER — Telehealth: Payer: Self-pay | Admitting: *Deleted

## 2015-10-16 ENCOUNTER — Other Ambulatory Visit: Payer: Self-pay | Admitting: Internal Medicine

## 2015-10-16 NOTE — Telephone Encounter (Signed)
Called patient at [phone] (Home #) and was told they had already talked to Alphia KavaValery O'Connell, RN regarding their toe.

## 2015-10-16 NOTE — Telephone Encounter (Signed)
Pt states she is still having bleeding on and off after her toenail procedure last week.  I told pt she would have oozing on and off for the next 3-4 weeks, but to continue soaks with either antibacterial soap or epsom salt, with the epsom salt being the most drying.  I told pt to use a thin layer of antibiotic ointment to her bandaid after each soaking and to allow the area to air dry when resting or not in enclosed shoes about 2-3 weeks from now.  Pt states she has white skin at the base of the nail and cuticle is that normal.  I told pt it was where the oozing was keeping the skin too moist, to use breathable bandaid and a light coat of the antibiotic ointment.  Pt states understanding.  Encouraged pt to call with concerns.

## 2015-10-17 MED FILL — ALLOPURINOL 100 MG TABLET: 100 | 30 days supply | Qty: 30 | Fill #0

## 2015-10-17 MED FILL — FUROSEMIDE 40 MG TABLET: 40 | 30 days supply | Qty: 30 | Fill #3

## 2015-10-24 MED FILL — DICLOFENAC SOD EC 75 MG TAB: 75 | 25 days supply | Qty: 50 | Fill #1

## 2015-11-21 ENCOUNTER — Encounter: Payer: Self-pay | Admitting: Internal Medicine

## 2015-11-21 ENCOUNTER — Ambulatory Visit (INDEPENDENT_AMBULATORY_CARE_PROVIDER_SITE_OTHER): Payer: 59 | Admitting: Internal Medicine

## 2015-11-21 DIAGNOSIS — F329 Major depressive disorder, single episode, unspecified: Secondary | ICD-10-CM

## 2015-11-21 DIAGNOSIS — F32A Depression, unspecified: Secondary | ICD-10-CM

## 2015-11-21 DIAGNOSIS — M1000 Idiopathic gout, unspecified site: Secondary | ICD-10-CM | POA: Diagnosis not present

## 2015-11-21 DIAGNOSIS — K219 Gastro-esophageal reflux disease without esophagitis: Secondary | ICD-10-CM | POA: Diagnosis not present

## 2015-11-21 DIAGNOSIS — M109 Gout, unspecified: Secondary | ICD-10-CM | POA: Insufficient documentation

## 2015-11-21 LAB — TSH: TSH: 0.96 mIU/L

## 2015-11-21 LAB — LIPID PANEL
Cholesterol: 128 mg/dL (ref 125–200)
HDL: 36 mg/dL — ABNORMAL LOW (ref >=46)
LDL Cholesterol: 82 mg/dL (ref <130)
Total CHOL/HDL Ratio: 3.6 Ratio (ref <=5.0)
Triglycerides: 49 mg/dL (ref <150)
VLDL: 10 mg/dL (ref <30)

## 2015-11-21 LAB — COMPLETE METABOLIC PANEL WITH GFR
ALT: 21 U/L (ref 6–29)
AST: 20 U/L (ref 10–30)
Albumin: 3.8 g/dL (ref 3.6–5.1)
Alkaline Phosphatase: 47 U/L (ref 33–115)
BUN: 13 mg/dL (ref 7–25)
CO2: 23 mmol/L (ref 20–31)
Calcium: 8.9 mg/dL (ref 8.6–10.2)
Chloride: 104 mmol/L (ref 98–110)
Creat: 0.89 mg/dL (ref 0.50–1.10)
GFR, Est African American: >89 mL/min (ref >=60)
GFR, Est Non African American: 85 mL/min (ref >=60)
Glucose, Bld: 76 mg/dL (ref 65–99)
Potassium: 4.1 mmol/L (ref 3.5–5.3)
Sodium: 138 mmol/L (ref 135–146)
Total Bilirubin: 0.4 mg/dL (ref 0.2–1.2)
Total Protein: 6.9 g/dL (ref 6.1–8.1)

## 2015-11-21 LAB — CBC
HCT: 39.3 % (ref 36.0–46.0)
Hemoglobin: 13.2 g/dL (ref 12.0–15.0)
MCH: 28.9 pg (ref 26.0–34.0)
MCHC: 33.6 g/dL (ref 30.0–36.0)
MCV: 86.2 fL (ref 78.0–100.0)
MPV: 11.1 fL (ref 8.6–12.4)
Platelets: 248 10*3/uL (ref 150–400)
RBC: 4.56 MIL/uL (ref 3.87–5.11)
RDW: 15 % (ref 11.5–15.5)
WBC: 7.7 10*3/uL (ref 4.0–10.5)

## 2015-11-21 LAB — POCT GLYCOSYLATED HEMOGLOBIN (HGB A1C): Hemoglobin A1C: 5

## 2015-11-21 MED ORDER — ESOMEPRAZOLE MAGNESIUM 40 MG PO CPDR: 40.0000 mg | DELAYED_RELEASE_CAPSULE | Freq: Every day | ORAL | Status: DC

## 2015-11-21 NOTE — Progress Notes (Signed)
Subjective:    Catherine [name] - 35 y.o. female MRN [id number]  Date of birth: 28-Mar-1981  HPI  Catherine [name] is 35 y.o. female with PMH of asthma, OSA, bipolar disorder, depressive disorder and morbid obesity here to establish care.  Obesity: Patient reports weight loss of over 15 lbs in the past 2-3 weeks. She has been using weight loss resolution drops 3x/day and has been restricting calories to 750-800 cal/day. Has cut out all sugar, oil and starches from diet. Is basically only eating white meat and veggies. Only drinking water, about 3-4 L/day.   GERD: Reports omeprazole is not controlling her symptoms. Has been taking Nexium over the counter which has been working well for her.   Depression: Reports she used to take multiple medications for depression and mental health, however she has been off those meds for many years. Occasionally takes Trazadone for sleep.   Gout: Last flare in November 2016 due to refill of medication not being in on time. No other flares since and reports good compliance with allopurinol.   PHQ 9 Score: 4 with not difficult at all checked     Health Maintenance Due  Topic Date Due  . HIV Screening  11/25/1996  . PAP SMEAR  02/11/2007  . TETANUS/TDAP  10/14/2011  . INFLUENZA VACCINE  05/14/2015    -  reports that she quit smoking about 2 years ago. Her smoking use included Cigarettes. She has a 7.75 pack-year smoking history. She does not have any smokeless tobacco history on file. - Review of Systems: Per HPI. - Past Medical History: Patient Active Problem List   Diagnosis Date Noted  . Gout 11/21/2015  . Obesity, morbid, BMI 50 or higher (HCC) 05/14/2015  . BIPOLAR DISORDER UNSPECIFIED 11/22/2008  . Depression 12/10/2006  . MIGRAINE, UNSPEC., W/O INTRACTABLE MIGRAINE 12/10/2006  . GASTROESOPHAGEAL REFLUX, NO ESOPHAGITIS 12/10/2006  . OSA (obstructive sleep apnea) 12/10/2006   - Medications: reviewed and updated Current Outpatient  Prescriptions  Medication Sig Dispense Refill  . allopurinol (ZYLOPRIM) 100 MG tablet TAKE 1 TABLET BY MOUTH DAILY. 30 tablet 2  . aspirin-acetaminophen-caffeine (EXCEDRIN MIGRAINE) 250-250-65 MG per tablet Take 1 tablet by mouth 2 (two) times daily as needed for migraine. For headache    . B Complex-Biotin-FA (B-COMPLEX PO) Take 1 tablet by mouth daily.      . cetirizine (ZYRTEC) 10 MG tablet Take 1 tablet (10 mg total) by mouth daily. 30 tablet 2  . diclofenac (VOLTAREN) 75 MG EC tablet Take 1 tablet (75 mg total) by mouth 2 (two) times daily. 50 tablet 2  . diclofenac sodium (VOLTAREN) 1 % GEL Apply 4 g topically 4 (four) times daily. 100 g 3  . esomeprazole (NEXIUM) 40 MG capsule Take 1 capsule (40 mg total) by mouth daily. 30 capsule 3  . furosemide (LASIX) 20 MG tablet Take 1 tablet (20 mg total) by mouth daily. 30 tablet 3  . ibuprofen (ADVIL,MOTRIN) 200 MG tablet Take 200 mg by mouth every 6 (six) hours as needed for moderate pain.    Marland Kitchen ipratropium (ATROVENT) 0.06 % nasal spray Place 2 sprays into both nostrils 4 (four) times daily. 15 mL 1  . mometasone (NASONEX) 50 MCG/ACT nasal spray   12  . potassium chloride (K-DUR) 10 MEQ tablet Take 1 tablet (10 mEq total) by mouth daily. 30 tablet 5  . traZODone (DESYREL) 50 MG tablet Take 0.5 tablets (25 mg total) by mouth at bedtime as needed for sleep. 30  tablet 1   No current facility-administered medications for this visit.     Review of Systems See HPI     Objective:   Physical Exam BP 129/86 mmHg  Pulse 86  Temp(Src) 98.1 F (36.7 C) (Oral)  Wt 444 lb 3.2 oz (201.488 kg)  LMP 10/11/2015 (Exact Date) Gen: obese female, NAD, alert, cooperative with exam, well-appearing HEENT: NCAT, PERRL, clear conjunctiva, oropharynx clear, supple neck CV: RRR, good S1/S2, no murmur, capillary refill brisk  Resp: CTABL, no wheezes, non-labored Abd: SNTND, BS present, no guarding or organomegaly Skin: no rashes, normal turgor  Neuro: no gross  deficits.  Psych: good insight, alert and oriented      Assessment & Plan:   GASTROESOPHAGEAL REFLUX, NO ESOPHAGITIS Reports good control with Nexium.  -Rx for Nexium sent  -if uncontrolled in the future would consider further work up   Depression PHQ-9 score of 4 and patient reported no difficulty with doing work, taking care of things at home or getting along with other people. Occasional insomnia well managed with Trazadone. Patient reports needing medical therapy for depression in the past but has been off medication for several years.  -continue to monitor mood, will discuss initiating therapy in the future if needed   Obesity, morbid, BMI 50 or higher Patient reports sudden weight loss in the past few weeks with extreme calorie restriction and weight loss drops. I am concerned about this severe drop in weight and limitation of her calories. While she is morbidly obese, a 750 calorie diet a day is still not appropriate for this patient.  -discussed that she should be having at least 1800-2000 calories a day to prevent severe malnutrition and energy depletion  -encouraged continued healthy eating choices and exercise  -nutrition referral placed  -patient to return in 2-4 weeks to monitor her weight status  -CBC, CMP, Lipid panel, A1C, and TSH ordered today given her co-morbidity   Gout Likely related to past diet habits and obesity. Well controlled.  -continue allopurinol    Patient aware that she is in need of PAP (last one in 2009). To make an appointment to return in 2-4 weeks.   Catherine [name], D.O. 11/21/2015, 11:07 AM PGY-1, [facility]

## 2015-11-21 NOTE — Patient Instructions (Signed)
Thank you for coming to see me today. It was a pleasure. Today we talked about:   Your Diet: I strongly suggest you increase your calories to 1800-2000/day. It is very impressive that you are dedicated to lose weight and I'm proud of you for making lifestyle changes, but it can be dangerous to limit calories too much.   We are getting routine blood work to check your electrolytes, kidney and liver function, blood counts, thyroid, blood sugar, and cholesterol. I will let you know the results.   Please follow-up with Dr. Earlene Plater in 2-4 weeks for a PAP smear.   Please call Dr. Gerilyn Pilgrim to set up a nutrition appointment.   If you have any questions or concerns, please do not hesitate to call the office at [phone].  Take Care,   Marcy Siren, DO

## 2015-11-21 NOTE — Assessment & Plan Note (Signed)
Reports good control with Nexium.  -Rx for Nexium sent  -if uncontrolled in the future would consider further work up

## 2015-11-21 NOTE — Assessment & Plan Note (Signed)
Likely related to past diet habits and obesity. Well controlled.  -continue allopurinol

## 2015-11-21 NOTE — Assessment & Plan Note (Signed)
PHQ-9 score of 4 and patient reported no difficulty with doing work, taking care of things at home or getting along with other people. Occasional insomnia well managed with Trazadone. Patient reports needing medical therapy for depression in the past but has been off medication for several years.  -continue to monitor mood, will discuss initiating therapy in the future if needed

## 2015-11-21 NOTE — Assessment & Plan Note (Addendum)
Patient reports sudden weight loss in the past few weeks with extreme calorie restriction and weight loss drops. I am concerned about this severe drop in weight and limitation of her calories. While she is morbidly obese, a 750 calorie diet a day is still not appropriate for this patient.  -discussed that she should be having at least 1800-2000 calories a day to prevent severe malnutrition and energy depletion  -encouraged continued healthy eating choices and exercise  -nutrition referral placed  -patient to return in 2-4 weeks to monitor her weight status  -CBC, CMP, Lipid panel, A1C, and TSH ordered today given her co-morbidity

## 2015-11-22 ENCOUNTER — Telehealth: Payer: Self-pay | Admitting: Internal Medicine

## 2015-11-22 NOTE — Telephone Encounter (Signed)
Called patient to discuss lab results. HDL low at 36 so encouraged more exercise and discussed "good cholesterol". Otherwise, labs all WNL.   Marcy Siren, D.O. 11/22/2015, 10:15 AM PGY-1, [facility]

## 2015-11-29 DIAGNOSIS — H5202 Hypermetropia, left eye: Secondary | ICD-10-CM | POA: Diagnosis not present

## 2015-11-29 DIAGNOSIS — H52223 Regular astigmatism, bilateral: Secondary | ICD-10-CM | POA: Diagnosis not present

## 2015-11-29 DIAGNOSIS — H5211 Myopia, right eye: Secondary | ICD-10-CM | POA: Diagnosis not present

## 2015-12-03 MED FILL — DICLOFENAC SOD EC 75 MG TAB: 75 | 25 days supply | Qty: 50 | Fill #2

## 2015-12-03 MED FILL — ESOMEPRAZOLE MAG DR 40 MG C: 40 | 30 days supply | Qty: 30 | Fill #0 | Status: TO

## 2015-12-03 MED FILL — FUROSEMIDE 40 MG TABLET: 40 | 30 days supply | Qty: 30 | Fill #4 | Status: TO

## 2015-12-11 ENCOUNTER — Ambulatory Visit: Payer: 59 | Admitting: Internal Medicine

## 2015-12-25 ENCOUNTER — Ambulatory Visit (INDEPENDENT_AMBULATORY_CARE_PROVIDER_SITE_OTHER): Payer: 59 | Admitting: Family Medicine

## 2015-12-25 ENCOUNTER — Encounter: Payer: Self-pay | Admitting: Family Medicine

## 2015-12-25 VITALS — Ht 65.5 in | Wt >= 6400 oz

## 2015-12-25 DIAGNOSIS — Z713 Dietary counseling and surveillance: Secondary | ICD-10-CM

## 2015-12-25 DIAGNOSIS — Z6841 Body Mass Index (BMI) 40.0 and over, adult: Secondary | ICD-10-CM

## 2015-12-25 NOTE — Patient Instructions (Signed)
Goals: 1. Eat at least 3 REAL meals and 1-2 snacks per day.  Aim for no more than 5 hours between eating.  Eat breakfast within one hour of getting up.  2. Obtain twice as many veg's as protein or carbohydrate foods for both lunch and dinner.  - Keep frozen veg's on hand for a quick veg side any time.    - Advanced planning will make the difference of following this format for meal or not.   3. Physical activity:  Water exercise at least 3 X wk.  Walk as much as you can on other days.   Complete your Goals Sheet, and bring to follow-up.    Starchy (carb) foods: Bread, rice, pasta, potatoes, corn, cereal, grits, crackers, bagels, muffins, all baked goods.  The less processed any starchy food is, usually the better it is for us.  The best carb's include FIBER.  (Fruits, milk, and yogurt also have carbohydrate, but most of these foods will not spike your blood sugar as the starchy foods will.)  Remember that there are a LOT of carb's in many drinks!    Protein foods: Meat, fish, poultry, eggs, dairy foods, and beans such as pinto and kidney beans (beans also provide carbohydrate).

## 2015-12-25 NOTE — Progress Notes (Signed)
Medical Nutrition Therapy:  Appt start time: 0900 end time:  1000. Spouse: Catherine [name]  Assessment:  Primary concerns today: Weight management.  [name] was accompanied by her wife [place]a today.  They share meals and food preparation.  She wants to be able to lose wt progressively.  Her hx has been wt fluctuations, with severe restrxn followed by rebound intake.  [name] works at [employer] 2nd shift, at [employer].    Learning Readiness:  Change in progress  Usual eating pattern includes 2 meals and 0-1 snack per day. Frequent foods and beverages include fried chx, ground Malaw[place]urkey, rice, potatoes, sweet tea (~32 oz, 4-5 X wk), non-cola sodas (20 oz 2-3 X wk), Kool-Aid (16 oz 2-3 X wk).  Avoided foods include oranges (mouth swells/itches), Br sprts, cauliflwr, swt pot's, swt sauces (disllike).   Usual physical activity includes climb stairs to apt (2nd floor) daily; walks 9-YO son to bus stop (~5 min); had been doing ~20 min TM walking 3 X wk in Jan, but knee pain has limited this activity.  [facility], and will start water aerobics tomorrow; hopes to go 3-4 X wk.    24-hr recall: (Up at 7:30 AM) B ( AM)-   --- Snk ( AM)-   --- L (12:30 PM)-  1 1/2 c hash browns, 2 fried chx breasts, water, 12 oz swt tea (2 tsp honey) Snk ( PM)-  --- D (8:30 PM)-  1 c hash browns, 2 fried chx breasts, 8 oz Kool-Aid, water Snk (12 AM)-  8 oz Kool-Aid Typical day? Yes.    Progress Towards Goal(s):  In progress.   Nutritional Diagnosis:  NI-5.8.3 Inappropriate intake of types of carbohydrates (specify): (refined carbs) As related to beverages.  As evidenced by usual daily intake of sweet drinks and limited nonstarchy veg's.    Intervention:  Nutrition education.  Handouts given during visit include:  AVS  Goals Sheet  Demonstrated degree of understanding via:  Teach Back  Barriers to learning/adherence to lifestyle change: Longstanding h/o poor diet and exercise pattern.     Monitoring/Evaluation:  Dietary intake, exercise, and body weight in 5 week(s).

## 2016-01-25 DIAGNOSIS — J020 Streptococcal pharyngitis: Secondary | ICD-10-CM | POA: Diagnosis not present

## 2016-01-25 MED FILL — PROMETHAZINE-DM SYRUP: 6.25-15 | 4 days supply | Qty: 80 | Fill #0

## 2016-01-25 MED FILL — AMOX TR-K CLV 875-125 MG TA: 875-125 | 10 days supply | Qty: 20 | Fill #0

## 2016-01-29 ENCOUNTER — Ambulatory Visit (INDEPENDENT_AMBULATORY_CARE_PROVIDER_SITE_OTHER): Payer: 59 | Admitting: Family Medicine

## 2016-01-29 ENCOUNTER — Encounter: Payer: Self-pay | Admitting: Family Medicine

## 2016-01-29 VITALS — Ht 65.5 in | Wt >= 6400 oz

## 2016-01-29 DIAGNOSIS — Z6841 Body Mass Index (BMI) 40.0 and over, adult: Secondary | ICD-10-CM | POA: Diagnosis not present

## 2016-01-29 NOTE — Patient Instructions (Addendum)
-   For best success, you may find getting into a routine of weekly planning/food prep is helpful.   - Great job on the exercise! - Make sure you do bring foods from home even on days your appetite is not great.    - If appetite is poor, still make an effort to eat at least every 5 hours, but allow yourself to just eat less.  The goal is to still get nutritionally balanced meals, just smaller than usual.    Goals: 1. Eat at least 3 REAL meals and 1-2 snacks per day. Aim for no more than 5 hours between eating. Eat breakfast within one hour of getting up.  2. Obtain twice as many veg's as protein or carbohydrate foods for both lunch and dinner.  3. Bedtime no later than midnight.  Physical activity remains the same, but no need to document for now: Water exercise at least 3 X wk. Walk as much as you can on other days.   - Two of the biggest killers of motivation and self-discipline are inadequate sleep and low blood sugar!  - This means being sure to eat regularly during the day and to get enough sleep.    Complete your new Goals Sheet, and bring to follow-up.

## 2016-01-29 NOTE — Progress Notes (Signed)
Medical Nutrition Therapy:  Appt start time: 0900 end time:  1000. Spouse: Catherine [name]  Assessment:  Primary concerns today: Weight management.  Catherine [name] started water exercise, which she has been doing 3-4 X wk since mid-March.  She's been enjoying it.  She has been getting 2-3 meals a day; most days are 3.  Veg intake has been variable, with one week of getting veg's twice daily on 6 days.  She has found that food choices have gone better when she's planned and prepared ahead.    Sleep is quite variable; Catherine [name] works second shift most days.  We discussed the importance of adequate sleep for maintaining motivation and self-discipline.    24-hr recall:  (Up at 6 AM; had been to bed at midnight; usually getting ~6 hrs sleep, sometimes more.) Napped from 7:15-8:30; took daughter to school; ran errands, then [employer] on way home.   B (9:15 AM)-  1 chx biscuit Napped from 10:30-11:45 AM Snk ( AM)-  --- Napped from 12-12:45 PM & 1-1:30 PM L (1:30 PM)-   Snk (3:30)-  1 c broccoli, 1 chx leg w/ skin, 1 1/2 c rice, water D (7 PM)-  1 grapefruit Snk (9 PM)-  1 c broccoli, 1 chx leg w/ skin, 1 1/2 c rice, water Snk (12 AM)-  10 saltines w/ ~1-2 tbsp pb  Typical day? Yes.  Yesterday was an especially good day; some days she has no appetite, so struggles to eat lunch.  Does not usually nap this much.    Progress Towards Goal(s):  In progress.   Nutritional Diagnosis:  NI-5.8.3 Inappropriate intake of types of carbohydrates (specify): (refined carbs) As related to beverages.  As evidenced by usual daily intake of sweet drinks and limited nonstarchy veg's.    Intervention:  Nutrition education.  Handouts given during visit include:  AVS  Goals Sheet, revised  Demonstrated degree of understanding via:  Teach Back  Barriers to learning/adherence to lifestyle change: Longstanding h/o poor diet and exercise pattern.    Monitoring/Evaluation:  Dietary intake, exercise, and body weight in 4  week(s).

## 2016-02-15 ENCOUNTER — Ambulatory Visit (INDEPENDENT_AMBULATORY_CARE_PROVIDER_SITE_OTHER): Payer: 59 | Admitting: Family Medicine

## 2016-02-15 ENCOUNTER — Encounter: Payer: Self-pay | Admitting: Family Medicine

## 2016-02-15 VITALS — BP 141/101 | HR 95 | Wt >= 6400 oz

## 2016-02-15 DIAGNOSIS — Z124 Encounter for screening for malignant neoplasm of cervix: Secondary | ICD-10-CM

## 2016-02-15 DIAGNOSIS — Z1151 Encounter for screening for human papillomavirus (HPV): Secondary | ICD-10-CM | POA: Diagnosis not present

## 2016-02-15 DIAGNOSIS — N938 Other specified abnormal uterine and vaginal bleeding: Secondary | ICD-10-CM | POA: Diagnosis not present

## 2016-02-15 DIAGNOSIS — Z01419 Encounter for gynecological examination (general) (routine) without abnormal findings: Secondary | ICD-10-CM

## 2016-02-15 MED FILL — ALL DAY ALLERGY 10 MG TAB: 10 | 30 days supply | Qty: 30 | Fill #0 | Status: TO

## 2016-02-15 MED FILL — ESOMEPRAZOLE MAG DR 40 MG C: 40 | 30 days supply | Qty: 30 | Fill #0

## 2016-02-15 MED FILL — FUROSEMIDE 40 MG TABLET: 40 | 30 days supply | Qty: 30 | Fill #0 | Status: TO

## 2016-02-15 NOTE — Progress Notes (Signed)
   Subjective:    Patient ID: Catherine [name], female    DOB: Dec 06, 1980, 35 y.o.   MRN: [id number]  HPI Patient here for PAP.  Does have history of AUB in 2009 - 3-4 months of amenorrhea, several weeks of heavy bleeding necessitating a blood transfusion.  Had EnBx at that time, which was negative.  Has normal cycles, lasting 3 days normally.  Last cycle was heavier, lasting 7 days.   Review of Systems  Constitutional: Negative for fever, chills and fatigue.  Genitourinary: Negative for urgency, vaginal bleeding, vaginal discharge, vaginal pain and pelvic pain.       Objective:   Physical Exam  Constitutional: She appears well-developed and well-nourished.  Cardiovascular: Normal rate, regular rhythm and normal heart sounds.  Exam reveals no gallop and no friction rub.   No murmur heard. Pulmonary/Chest: Effort normal and breath sounds normal. No respiratory distress. She has no wheezes. She has no rales. She exhibits no tenderness.  Abdominal: Soft. Bowel sounds are normal. She exhibits no distension and no mass. There is no tenderness. There is no rebound and no guarding.  Obese  Genitourinary: There is no rash, tenderness, lesion or injury on the right labia. There is no rash, tenderness, lesion or injury on the left labia. Uterus is not deviated, not enlarged and not fixed. Cervix exhibits no motion tenderness, no discharge and no friability. Right adnexum displays no mass, no tenderness and no fullness. Left adnexum displays no mass, no tenderness and no fullness. No erythema, tenderness or bleeding in the vagina. No foreign body around the vagina. No signs of injury around the vagina. No vaginal discharge found.  Skin: Skin is warm and dry.  Psychiatric: She has a normal mood and affect. Her behavior is normal. Judgment and thought content normal.      Assessment & Plan:  1. Pap test, as part of routine gynecological examination PAP today.  Discussed using of IUD or other  progesterone BC as body habitus can create endometrial hyperplasia.  IUD information given.  Pt to consider.

## 2016-02-15 NOTE — Addendum Note (Signed)
Addended by: Garret ReddishBARNES, Quinnley Colasurdo M on: 02/15/2016 12:35 PM   Modules accepted: Orders

## 2016-02-15 NOTE — Patient Instructions (Signed)
Levonorgestrel intrauterine device (IUD) What is this medicine? LEVONORGESTREL IUD (LEE voe nor jes trel) is a contraceptive (birth control) device. The device is placed inside the uterus by a healthcare professional. It is used to prevent pregnancy and can also be used to treat heavy bleeding that occurs during your period. Depending on the device, it can be used for 3 to 5 years. This medicine may be used for other purposes; ask your health care provider or pharmacist if you have questions. What should I tell my health care provider before I take this medicine? They need to know if you have any of these conditions: -abnormal Pap smear -cancer of the breast, uterus, or cervix -diabetes -endometritis -genital or pelvic infection now or in the past -have more than one sexual partner or your partner has more than one partner -heart disease -history of an ectopic or tubal pregnancy -immune system problems -IUD in place -liver disease or tumor -problems with blood clots or take blood-thinners -use intravenous drugs -uterus of unusual shape -vaginal bleeding that has not been explained -an unusual or allergic reaction to levonorgestrel, other hormones, silicone, or polyethylene, medicines, foods, dyes, or preservatives -pregnant or trying to get pregnant -breast-feeding How should I use this medicine? This device is placed inside the uterus by a health care professional. Talk to your pediatrician regarding the use of this medicine in children. Special care may be needed. Overdosage: If you think you have taken too much of this medicine contact a poison control center or emergency room at once. NOTE: This medicine is only for you. Do not share this medicine with others. What if I miss a dose? This does not apply. What may interact with this medicine? Do not take this medicine with any of the following medications: -amprenavir -bosentan -fosamprenavir This medicine may also interact with  the following medications: -aprepitant -barbiturate medicines for inducing sleep or treating seizures -bexarotene -griseofulvin -medicines to treat seizures like carbamazepine, ethotoin, felbamate, oxcarbazepine, phenytoin, topiramate -modafinil -pioglitazone -rifabutin -rifampin -rifapentine -some medicines to treat HIV infection like atazanavir, indinavir, lopinavir, nelfinavir, tipranavir, ritonavir -St. John's wort -warfarin This list may not describe all possible interactions. Give your health care provider a list of all the medicines, herbs, non-prescription drugs, or dietary supplements you use. Also tell them if you smoke, drink alcohol, or use illegal drugs. Some items may interact with your medicine. What should I watch for while using this medicine? Visit your doctor or health care professional for regular check ups. See your doctor if you or your partner has sexual contact with others, becomes HIV positive, or gets a sexual transmitted disease. This product does not protect you against HIV infection (AIDS) or other sexually transmitted diseases. You can check the placement of the IUD yourself by reaching up to the top of your vagina with clean fingers to feel the threads. Do not pull on the threads. It is a good habit to check placement after each menstrual period. Call your doctor right away if you feel more of the IUD than just the threads or if you cannot feel the threads at all. The IUD may come out by itself. You may become pregnant if the device comes out. If you notice that the IUD has come out use a backup birth control method like condoms and call your health care provider. Using tampons will not change the position of the IUD and are okay to use during your period. What side effects may I notice from receiving this medicine?   Side effects that you should report to your doctor or health care professional as soon as possible: -allergic reactions like skin rash, itching or  hives, swelling of the face, lips, or tongue -fever, flu-like symptoms -genital sores -high blood pressure -no menstrual period for 6 weeks during use -pain, swelling, warmth in the leg -pelvic pain or tenderness -severe or sudden headache -signs of pregnancy -stomach cramping -sudden shortness of breath -trouble with balance, talking, or walking -unusual vaginal bleeding, discharge -yellowing of the eyes or skin Side effects that usually do not require medical attention (report to your doctor or health care professional if they continue or are bothersome): -acne -breast pain -change in sex drive or performance -changes in weight -cramping, dizziness, or faintness while the device is being inserted -headache -irregular menstrual bleeding within first 3 to 6 months of use -nausea This list may not describe all possible side effects. Call your doctor for medical advice about side effects. You may report side effects to FDA at 1-800-FDA-1088. Where should I keep my medicine? This does not apply. NOTE: This sheet is a summary. It may not cover all possible information. If you have questions about this medicine, talk to your doctor, pharmacist, or health care provider.    2016, Elsevier/Gold Standard. (2011-10-30 13:54:04)  

## 2016-02-18 LAB — CYTOLOGY - PAP

## 2016-02-24 DIAGNOSIS — N390 Urinary tract infection, site not specified: Secondary | ICD-10-CM | POA: Diagnosis not present

## 2016-02-25 ENCOUNTER — Ambulatory Visit: Payer: 59 | Admitting: Family Medicine

## 2016-02-25 MED FILL — NITROFURANTOIN MONO-MCR 100: 100 | 5 days supply | Qty: 10 | Fill #0

## 2016-02-27 MED FILL — CHLORHEXIDINE 0.12% RINSE: 0.12 | 15 days supply | Qty: 473 | Fill #0

## 2016-03-03 ENCOUNTER — Ambulatory Visit (INDEPENDENT_AMBULATORY_CARE_PROVIDER_SITE_OTHER): Payer: 59 | Admitting: Family Medicine

## 2016-03-03 ENCOUNTER — Encounter: Payer: Self-pay | Admitting: Family Medicine

## 2016-03-03 VITALS — Ht 65.5 in | Wt >= 6400 oz

## 2016-03-03 DIAGNOSIS — Z6841 Body Mass Index (BMI) 40.0 and over, adult: Secondary | ICD-10-CM | POA: Diagnosis not present

## 2016-03-03 NOTE — Progress Notes (Signed)
Medical Nutrition Therapy:  Appt start time: 0900 end time:  1000. Spouse: Tabitha  Assessment:  Primary concerns today: Weight management.  Catherine [name] started water exercise in mid-March, but worked 1st shift at [employer] recently, so was unable to go to water for past couple weeks.  She is now doing a combination of 1st and 2nd shift.  Anticipates getting back to regularity soon.  [name] suggests she has been getting 1000-5000 steps/day even though she is not doing the water exercise.  Experiences knee pain and lower back pain, but attempts to walk through the pain.  Although she is elevating her feet at night, has still had edema in her ankles and feet.    Catherine [name] said today that although she is not eating much or frequently, on days her appetite is poor, she tends to drink a lot of juice.    Usual 2nd shift schedule: Up at 8:30; Bkfst by 9:30 (if eating); work at 2 PM; if no bkfst, lunch at 3:30 (5 PM if did have brkfst); Dinner after work at [employer]11 PM.  Not often snacking.  When working 1st shift, usually had breakfast at 7:30 AM, lunch ~1 PM, and dinner ~6 or 7 PM.  Estimate she has been getting 3 meals a day only while working 1st shift; otherwise getting two a day.    Prior to last 2 wks of switched work schedule, Catherine [name] had been getting to bed by midnight; averagting ~7-8 hrs of sleep.    24-hr recall:  (Worked 1st shift Sat; up at 6:30 AM Sunday) B (8:30 AM)-  1 scrmbld egg, water Snk ( AM)-  (Church ~12-6 PM) L ( PM)-  --- Snk ( PM)-  --- D (8:30 PM)-  2 hotdogs, 2 slc bread, 1 c lemonade,  Snk ( PM)-  --- Typical day? Yes.  for recently; body "has just been off" last couple of weeks.    Progress Towards Goal(s):  In progress.   Nutritional Diagnosis:  NI-5.8.3 Inappropriate intake of types of carbohydrates (specify): (refined carbs) As related to beverages.  As evidenced by frequent intake of sweet drinks.    Intervention:  Nutrition education.  Handouts given  during visit include:  AVS  Goals Sheet, revised  Demonstrated degree of understanding via:  Teach Back  Barriers to learning/adherence to lifestyle change: Longstanding h/o poor diet and exercise pattern.    Monitoring/Evaluation:  Dietary intake, exercise, and body weight in 5 week(s).

## 2016-03-03 NOTE — Patient Instructions (Addendum)
Revised Goals: 1. Eat at least 3 REAL meals and 1-2 snacks per day; no more than 5 hours between eating; eat breakfast within one hour of getting up.  - Lunch and dinner meals do not count unless includes protein, starch, and veg's.  2. Get at least 60 min of exercise 5 X week.   3. Bedtime no later than midnight (or whatever time will allow at least 7 hours of sleep).  - Complete your Goals Sheet, and bring to follow-up.   - TASTE PREFERENCES ARE LEARNED.  This means that it will get easier to choose foods you know are good for you if you are exposed to them enough.  You may want to work on expanding your food repertoire to include yogurt, more fruits and veg's.  Exposure and consistency are the keys to learning to like new foods.     - Snack ideas:  Fruit, unsweetened applesauce, nuts and seeds (2 tblsp = serving), yogurt, string cheese (1 = portion), 1/2 sandwich or small portions of leftovers, high-fiber cereal with fat-free or 1% milk.    - Juice is NOT equivalent to fruit!  It will spike blood sugars, and promote fat storage.  Eat vs. drink your fruit!  - EMAIL FOR A FOLLOW-UP APPT THE WEEK OF June 27 (MON OR TUE, OR THUR PM).

## 2016-03-18 ENCOUNTER — Ambulatory Visit: Payer: 59 | Admitting: Family Medicine

## 2016-03-19 ENCOUNTER — Other Ambulatory Visit: Payer: Self-pay | Admitting: Podiatry

## 2016-03-19 ENCOUNTER — Ambulatory Visit (INDEPENDENT_AMBULATORY_CARE_PROVIDER_SITE_OTHER): Payer: 59 | Admitting: Student

## 2016-03-19 ENCOUNTER — Encounter: Payer: Self-pay | Admitting: Student

## 2016-03-19 VITALS — BP 137/88 | HR 91 | Temp 97.8°F | Ht 65.5 in | Wt >= 6400 oz

## 2016-03-19 DIAGNOSIS — R300 Dysuria: Secondary | ICD-10-CM

## 2016-03-19 LAB — POCT URINALYSIS DIPSTICK
Bilirubin, UA: NEGATIVE
Blood, UA: NEGATIVE
Glucose, UA: NEGATIVE
Ketones, UA: NEGATIVE
Nitrite, UA: NEGATIVE
Spec Grav, UA: 1.025
Urobilinogen, UA: 1
pH, UA: 6

## 2016-03-19 LAB — POCT UA - MICROSCOPIC ONLY: WBC, Ur, HPF, POC: >20

## 2016-03-19 NOTE — Patient Instructions (Signed)
It was great seeing you today! We have addressed the following issues today  1. Burning with urination: I have sent your urine for culture. If it shows infection, I will send a prescription to your pharmacy and notify you.    If we did any lab work today, and the results require attention, either me or my nurse will get in touch with you. If everything is normal, you will get a letter in mail. If you don't hear from us in two weeks, please give us a call. Otherwise, I look forward to talking with you again at our next visit. If you have any questions or concerns before then, please call the clinic at [phone].  Please bring all your medications to every doctors visit   Sign up for My Chart to have easy access to your labs results, and communication with your Primary care physician.    Please check-out at the front desk before leaving the clinic.   Take Care,

## 2016-03-19 NOTE — Progress Notes (Signed)
   Subjective:    Patient ID: Catherine [name], female    DOB: [birth year], 35 y.o.   MRN: [id number]  CC: UTI  [facility] for UTI with Macrobid. Took Macrobid for 5 days. She says everything was fine until two days ago when she started feeling burning pain toward the end of her micturition. Urine looks darker, cloudy and foamy. Also reports low abdominal pain. Denies fever, chills and back pain. Denies vaginal bleeding and vaginal discharge. Denies lesion in private areas.  LMP 02/24/2016. She is sexually active with female partner.   Review of Systems  Per HPI Objective:   Physical Exam Filed Vitals:   03/19/16 1617  BP: 137/88  Pulse: 91  Temp: 97.8 F (36.6 C)  TempSrc: Oral  Height: 5' 5.5" (1.664 m)  Weight: 460 lb (208.655 kg)    GEN: appears well, NAD GU: no suprapubic or CVA tenderness PSYCH: appropriate mood and affect     Assessment & Plan:  Dysuria  Burning sensation at the end of micturition likely periurethral mucosal barrier break than cystitis or urethral involvement. UA with 2+ LEs, 1+ bacteria otherwise normal. No suprapubic tenderness on my exam. -Urine culture

## 2016-03-20 DIAGNOSIS — R300 Dysuria: Secondary | ICD-10-CM | POA: Insufficient documentation

## 2016-03-20 NOTE — Assessment & Plan Note (Signed)
Burning sensation at the end of micturition likely periurethral mucosal barrier break than cystitis or urethral involvement. UA with 2+ LEs, 1+ bacteria otherwise normal. No suprapubic tenderness on my exam. -Urine culture

## 2016-03-22 ENCOUNTER — Other Ambulatory Visit: Payer: Self-pay | Admitting: Student

## 2016-03-22 DIAGNOSIS — N3000 Acute cystitis without hematuria: Secondary | ICD-10-CM

## 2016-03-22 LAB — URINE CULTURE: Colony Count: >=100000

## 2016-03-22 MED ORDER — CEPHALEXIN 500 MG PO CAPS: 500.0000 mg | ORAL_CAPSULE | Freq: Three times a day (TID) | ORAL | Status: DC

## 2016-03-26 ENCOUNTER — Telehealth: Payer: Self-pay | Admitting: *Deleted

## 2016-03-26 MED ORDER — DICLOFENAC SODIUM 75 MG PO TBEC: 75.0000 mg | DELAYED_RELEASE_TABLET | Freq: Two times a day (BID) | ORAL | Status: DC

## 2016-03-26 NOTE — Telephone Encounter (Signed)
Pt called for refill of Diclofenac.  I reviewed clinicals and her last appt was 09/2015.  I refilled the Diclofenac 75mg  #50 as previously, 0refills, pt needs an appt.  Left message informing pt of the refill and necessity of an appt prior to future refills.

## 2016-03-31 MED FILL — CEPHALEXIN 500 MG CAPSULE: 500 | 5 days supply | Qty: 15 | Fill #0

## 2016-03-31 MED FILL — DICLOFENAC SOD EC 75 MG TAB: 75 | 25 days supply | Qty: 50 | Fill #0

## 2016-04-08 ENCOUNTER — Ambulatory Visit: Payer: 59 | Admitting: Family Medicine

## 2016-04-17 ENCOUNTER — Ambulatory Visit: Payer: 59

## 2016-04-22 MED FILL — ALLOPURINOL 100 MG TABLET: 100 | 30 days supply | Qty: 30 | Fill #1

## 2016-04-22 MED FILL — FUROSEMIDE 40 MG TABLET: 40 | 30 days supply | Qty: 30 | Fill #0

## 2016-06-04 ENCOUNTER — Ambulatory Visit (INDEPENDENT_AMBULATORY_CARE_PROVIDER_SITE_OTHER): Payer: 59 | Admitting: Family Medicine

## 2016-06-04 ENCOUNTER — Encounter: Payer: Self-pay | Admitting: Family Medicine

## 2016-06-04 VITALS — BP 143/88 | HR 92 | Temp 99.0°F | Ht 66.0 in | Wt >= 6400 oz

## 2016-06-04 DIAGNOSIS — H60393 Other infective otitis externa, bilateral: Secondary | ICD-10-CM

## 2016-06-04 DIAGNOSIS — H60399 Other infective otitis externa, unspecified ear: Secondary | ICD-10-CM | POA: Insufficient documentation

## 2016-06-04 DIAGNOSIS — R109 Unspecified abdominal pain: Secondary | ICD-10-CM | POA: Diagnosis not present

## 2016-06-04 DIAGNOSIS — Z79899 Other long term (current) drug therapy: Secondary | ICD-10-CM

## 2016-06-04 LAB — COMPLETE METABOLIC PANEL WITH GFR
ALT: 14 U/L (ref 6–29)
AST: 16 U/L (ref 10–30)
Albumin: 3.6 g/dL (ref 3.6–5.1)
Alkaline Phosphatase: 59 U/L (ref 33–115)
BUN: 13 mg/dL (ref 7–25)
CO2: 21 mmol/L (ref 20–31)
Calcium: 9 mg/dL (ref 8.6–10.2)
Chloride: 106 mmol/L (ref 98–110)
Creat: 0.9 mg/dL (ref 0.50–1.10)
GFR, Est African American: >89 mL/min (ref >=60)
GFR, Est Non African American: 84 mL/min (ref >=60)
Glucose, Bld: 83 mg/dL (ref 65–99)
Potassium: 4 mmol/L (ref 3.5–5.3)
Sodium: 138 mmol/L (ref 135–146)
Total Bilirubin: 0.5 mg/dL (ref 0.2–1.2)
Total Protein: 6.7 g/dL (ref 6.1–8.1)

## 2016-06-04 LAB — CBC
HCT: 37.3 % (ref 35.0–45.0)
Hemoglobin: 12.1 g/dL (ref 11.7–15.5)
MCH: 27.7 pg (ref 27.0–33.0)
MCHC: 32.4 g/dL (ref 32.0–36.0)
MCV: 85.4 fL (ref 80.0–100.0)
MPV: 11 fL (ref 7.5–12.5)
Platelets: 290 10*3/uL (ref 140–400)
RBC: 4.37 MIL/uL (ref 3.80–5.10)
RDW: 14.3 % (ref 11.0–15.0)
WBC: 13.4 10*3/uL — ABNORMAL HIGH (ref 3.8–10.8)

## 2016-06-04 MED ORDER — ONDANSETRON 8 MG PO TBDP: 8.0000 mg | ORAL_TABLET | Freq: Three times a day (TID) | ORAL | 0 refills | Status: DC | PRN

## 2016-06-04 MED ORDER — CIPROFLOXACIN-DEXAMETHASONE 0.3-0.1 % OT SUSP: 4.0000 [drp] | Freq: Two times a day (BID) | OTIC | 0 refills | Status: DC

## 2016-06-04 NOTE — Patient Instructions (Signed)
It was a pleasure seeing you today in our clinic. Today we discussed your nausea and vomiting. Here is the treatment plan we have discussed and agreed upon together:   - You likely have a viral illness that is causing the nausea and vomiting. - I prescribed you Zofran. This is a dissolvable tablet that you place underneath the tongue. This will help with any nausea can take it up to 3 times a day. - Stay well-hydrated with water. - Avoid fatty foods. - Place 4 drops into each ear of the ear drops provided to help with your ear pain. Avoid swimming or submerging her head underwater for the next 2 weeks.

## 2016-06-04 NOTE — Assessment & Plan Note (Addendum)
Patient is here with nausea vomiting and abdominal pain. Vomiting began 2 days ago and has not persisted after the first 24 hours. History and physical exam consistent with viral gastritis. However differential includes gallbladder disease or hepatitis. Will treat conservatively at this time. - Encouraged hydration with water. - CBC and CMP obtained today - Zofran for nausea - Return in 2 weeks if symptoms do not improve.  Next: Consider abdominal ultrasound to rule out cholecystitis

## 2016-06-04 NOTE — Progress Notes (Signed)
VOMITING Started Monday. Had a headache. Some cold sweats. Patient still has constant nausea. Nausea is worse 30-60 minutes after meals.  Vomiting began 2 days ago. Progression: only Monday, only nauseated since. Number of times vomited in last day: 0 (6x on Monday) Medications tried: none Recent travel: no Recent sick contacts: no Ingested suspicious foods: no Immunocompromised: no  Symptoms Diarrhea: no Abdominal pain: no Blood in vomit: no Weight loss: no Decreased urine output: no Lightheadedness: no Fever: no, but cold sweats Bloody stools: no  EAR PAIN: Started late last week. Was at the beach and started hurting shortly thereafter. Pain with any motion of the auricle. No hearing deficit.  ROS see HPI Smoking Status noted  CC, SH/smoking status, and VS noted  Objective: BP (!) 143/88   Pulse 92   Temp 99 F (37.2 C) (Oral)   Ht 5\' 6"  (1.676 m)   Wt (!) 463 lb 9.6 oz (210.3 kg)   LMP 05/09/2016   BMI 74.83 kg/m  Gen: NAD, alert, cooperative, and pleasant. HEENT: NCAT, EOMI, PERRL, OP clear, no LAD, tympanic canal erythematous bilaterally (L>R) CV: RRR, no murmur Resp: CTAB, no wheezes, non-labored Abd: Morbidly obese, nondistended, acute tenderness along the RUQ with some guarding. Ext: No edema, warm Neuro: Alert and oriented, Speech clear, No gross deficits  Assessment and plan:  Abdominal pain Patient is here with nausea vomiting and abdominal pain. Vomiting began 2 days ago and has not persisted after the first 24 hours. History and physical exam consistent with viral gastritis. However differential includes gallbladder disease or hepatitis. Will treat conservatively at this time. - Encouraged hydration with water. - CBC and CMP obtained today - Zofran for nausea - Return in 2 weeks if symptoms do not improve.  Next: Consider abdominal ultrasound to rule out cholecystitis  Otitis, externa, infective Evidence of otitis externa on exam. - Ciprodex  otic solution provided. Twice a day 7 days   Orders Placed This Encounter  Procedures  . COMPLETE METABOLIC PANEL WITH GFR  . CBC    Meds ordered this encounter  Medications  . ondansetron (ZOFRAN ODT) 8 MG disintegrating tablet    Sig: Take 1 tablet (8 mg total) by mouth every 8 (eight) hours as needed for nausea or vomiting.    Dispense:  20 tablet    Refill:  0  . ciprofloxacin-dexamethasone (CIPRODEX) otic suspension    Sig: Place 4 drops into both ears 2 (two) times daily. For 7 days.    Dispense:  7.5 mL    Refill:  0     Kathee DeltonIan D Takeshia Wenk, MD,MS,  PGY3 06/04/2016 12:58 PM

## 2016-06-04 NOTE — Assessment & Plan Note (Signed)
Evidence of otitis externa on exam. - Ciprodex otic solution provided. Twice a day 7 days

## 2016-06-05 MED FILL — ONDANSETRON ODT 8 MG TABLET: 8 | 6 days supply | Qty: 20 | Fill #0

## 2016-06-05 MED FILL — CIPRODEX OTIC SUSPENSION: 0.3-0.1 | 9 days supply | Qty: 8 | Fill #0

## 2016-06-17 MED FILL — ALL DAY ALLERGY 10 MG TAB: 10 | 30 days supply | Qty: 30 | Fill #0

## 2016-06-18 ENCOUNTER — Other Ambulatory Visit: Payer: Self-pay | Admitting: *Deleted

## 2016-06-18 MED ORDER — FUROSEMIDE 20 MG PO TABS: 20.0000 mg | ORAL_TABLET | Freq: Every day | ORAL | 3 refills | Status: DC

## 2016-06-18 MED FILL — FUROSEMIDE 20 MG TABLET: 20 | 30 days supply | Qty: 30 | Fill #0

## 2016-07-25 ENCOUNTER — Ambulatory Visit (INDEPENDENT_AMBULATORY_CARE_PROVIDER_SITE_OTHER): Payer: 59 | Admitting: Internal Medicine

## 2016-07-25 ENCOUNTER — Encounter: Payer: Self-pay | Admitting: Internal Medicine

## 2016-07-25 VITALS — BP 156/96 | HR 90 | Temp 98.0°F | Ht 66.0 in | Wt >= 6400 oz

## 2016-07-25 DIAGNOSIS — J45909 Unspecified asthma, uncomplicated: Secondary | ICD-10-CM

## 2016-07-25 DIAGNOSIS — K219 Gastro-esophageal reflux disease without esophagitis: Secondary | ICD-10-CM

## 2016-07-25 MED ORDER — ALBUTEROL SULFATE HFA 108 (90 BASE) MCG/ACT IN AERS: 2.0000 | INHALATION_SPRAY | Freq: Four times a day (QID) | RESPIRATORY_TRACT | 0 refills | Status: DC | PRN

## 2016-07-25 MED ORDER — RANITIDINE HCL 150 MG PO TABS: 150.0000 mg | ORAL_TABLET | Freq: Two times a day (BID) | ORAL | 1 refills | Status: DC

## 2016-07-25 MED FILL — raNITIdine HCL 150 MG TABS: 150 | 30 days supply | Qty: 60 | Fill #0 | Status: TO

## 2016-07-25 MED FILL — VENTOLIN HFA 90 MCG INHALER: 108 (90 BAS | 25 days supply | Qty: 18 | Fill #0

## 2016-07-25 NOTE — Patient Instructions (Signed)
I have prescribed another acid blocker for your heartburn. I have also referred you to GI. You should be getting a call about that appointment soon.   Please make an appointment with Dr. Raymondo BandKoval (pharamcy) for pulmonary function testing. I have sent Albuterol to the pharmacy.   Make an appointment with Dr. Gerilyn PilgrimSykes!   Take Care,   Dr. Earlene PlaterWallace

## 2016-07-25 NOTE — Assessment & Plan Note (Signed)
Patient reports history of asthma. Given return of DOE and wheezing after several years of being symptom and medication free, concerned that another process such as restrictive lung disease related to obesity may be contributing to her symptoms.  -Rx for albuterol today  -PFTs with Dr. Raymondo BandKoval for further evaluation

## 2016-07-25 NOTE — Progress Notes (Signed)
   Subjective:    Catherine [name] - 35 y.o. female MRN [id number]  Date of birth: 26-Sep-1981  HPI  Catherine [name] is here for SDA for heartburn and asthma.  Heartburn: Has been on Nexium since February with prior good control of symptoms. However, for the past one month everything she eats gives her heartburn. Burning pain in her stomach similar to a hunger pain but occurs after eating. Denies regurgitation and vomiting. No nausea. Denies pain with swallowing or sensation of food getting stuck in throat. Avoids chocolate, spicy foods, acid foods etc. [place]. Has tried chewable Tums and Mylanta without much relief. Has previously been on Prilosec.  Asthma: Reports h/o asthma as a child and young adult. Has previously been on Albuterol but has not used it or had an Rx for at least 4-5 years. Reports now she is having DOE and wheezing with walking especially when climbing stairs. Denies nighttime symptoms. Denies chest pain.    -  reports that she quit smoking about 3 years ago. Her smoking use included Cigarettes. She has a 7.75 pack-year smoking history. She does not have any smokeless tobacco history on file. - Review of Systems: Per HPI. - Past Medical History: Patient Active Problem List   Diagnosis Date Noted  . Asthma 07/25/2016  . Abdominal pain 06/04/2016  . Otitis, externa, infective 06/04/2016  . Dysuria 03/20/2016  . Gout 11/21/2015  . Obesity, morbid, BMI 50 or higher (HCC) 05/14/2015  . BIPOLAR DISORDER UNSPECIFIED 11/22/2008  . Depression 12/10/2006  . MIGRAINE, UNSPEC., W/O INTRACTABLE MIGRAINE 12/10/2006  . GASTROESOPHAGEAL REFLUX, NO ESOPHAGITIS 12/10/2006  . OSA (obstructive sleep apnea) 12/10/2006   - Medications: reviewed and updated    Objective:   Physical Exam BP (!) 156/96   Pulse 90   Temp 98 F (36.7 C) (Oral)   Ht 5\' 6"  (1.676 m)   Wt (!) 471 lb (213.6 kg)   BMI 76.02 kg/m  Gen: NAD, alert, cooperative with  exam, well-appearing, morbidly obese  Resp: CTABL, no wheezes, non-labored Abd: SNTND, BS present, no guarding or organomegaly    Assessment & Plan:   GASTROESOPHAGEAL REFLUX, NO ESOPHAGITIS Uncontrolled despite PPI. Report of symptoms with every meal is concerning.  -add Ranitidine  -referral to GI for further work up and possible EGD   Obesity, morbid, BMI 50 or higher Patient reports that she plans to follow up with Dr. Gerilyn PilgrimSykes again. She got "down" about her weight over the summer but is now motivated again and feeling better. She has started working out at the aquatic center again.  -encouraged follow up with Dr. Gerilyn PilgrimSykes  -discussed she may be a candidate for bariatric surgery, we will re-address in the future   Asthma Patient reports history of asthma. Given return of DOE and wheezing after several years of being symptom and medication free, concerned that another process such as restrictive lung disease related to obesity may be contributing to her symptoms.  -Rx for albuterol today  -PFTs with Dr. Raymondo BandKoval for further evaluation     Catherine [name], D.O. 07/25/2016, 11:05 AM PGY-2, [facility]

## 2016-07-25 NOTE — Assessment & Plan Note (Signed)
Patient reports that she plans to follow up with Dr. Gerilyn PilgrimSykes again. She got "down" about her weight over the summer but is now motivated again and feeling better. She has started working out at the aquatic center again.  -encouraged follow up with Dr. Gerilyn PilgrimSykes  -discussed she may be a candidate for bariatric surgery, we will re-address in the future

## 2016-07-25 NOTE — Assessment & Plan Note (Signed)
Uncontrolled despite PPI. Report of symptoms with every meal is concerning.  -add Ranitidine  -referral to GI for further work up and possible EGD

## 2016-07-28 ENCOUNTER — Ambulatory Visit (INDEPENDENT_AMBULATORY_CARE_PROVIDER_SITE_OTHER): Payer: 59 | Admitting: Family Medicine

## 2016-07-28 ENCOUNTER — Encounter: Payer: Self-pay | Admitting: Family Medicine

## 2016-07-28 NOTE — Progress Notes (Signed)
Medical Nutrition Therapy:  Appt start time: 1200 end time:  1245. Spouse: Tabitha  Assessment:  Primary concerns today: Weight management.  Catherine [name] said she had recently lost motivation, feeling depressed.  She had started back with water exercise, but started missing ex times in July b/c of helping family members out.  She then started feeling hopeless when it was so hard to make her exercise times, and she was not seeing any change in her weight.  She went to the pool 3 times last week, however, and is starting to feel more motivated.  She also plans to start doing some walking near where she lives.  Still often getting only 2 meals a day.    24-hr recall:  (Up at 7:30 AM) B (9 AM)-  2 slc pizza, 2 chx wings, 1 c lemonade  Snk ( AM)-   L (3:30 PM)-  2 Cookout hotdogs on buns w/ chili & onions, 20 oz swt tea Snk ( PM)-   D (9 PM)-  2 1/2 c grnd [place] with mac&chs, water Snk ( PM)-   Typical day? No.  [place] [facility] [place]  More typical day probably includes less food, and some vegetables.      Progress Towards Goal(s):  In progress.   Nutritional Diagnosis:  No progress noted on NI-5.8.3 Inappropriate intake of types of carbohydrates (specify): (refined carbs) As related to beverages.  As evidenced by frequent intake of sweet drinks.    Intervention:  Nutrition education.  Handouts given during visit include:  AVS  Demonstrated degree of understanding via:  Teach Back  Barriers to learning/adherence to lifestyle change: Longstanding h/o poor diet and exercise pattern.    Monitoring/Evaluation:  Dietary intake, exercise, and body weight in 4 week(s).

## 2016-07-28 NOTE — Patient Instructions (Addendum)
Goals: 1. Water exercise 4 X wk and walking at least 15 minutes 3 X wk.   2. Eat at least 3 REAL meals and 1-2 snacks per day.  Aim for no more than 5 hours between eating.  Eat breakfast within one hour of getting up.  3. Get vegetables with both lunch and dinner.    - Keep some frozen veg's on hand.    - Consider what carb foods in the meal can be partially replaced by vegetables.    Call if you need to change your follow-up appt, 11/14 at 2 PM:  [phone].

## 2016-07-29 ENCOUNTER — Encounter: Payer: Self-pay | Admitting: Internal Medicine

## 2016-07-31 ENCOUNTER — Encounter: Payer: Self-pay | Admitting: Pharmacist

## 2016-07-31 ENCOUNTER — Ambulatory Visit (INDEPENDENT_AMBULATORY_CARE_PROVIDER_SITE_OTHER): Payer: 59 | Admitting: Pharmacist

## 2016-07-31 DIAGNOSIS — J4520 Mild intermittent asthma, uncomplicated: Secondary | ICD-10-CM | POA: Diagnosis not present

## 2016-07-31 NOTE — Patient Instructions (Addendum)
It was nice to meet you today!  Today, we performed a lung function test for your shortness of breath and wheezing. Your results show normal lung function. The wheezing may be related to your allergies or a viral infection.   Continue using the albuterol inhaler as needed. If your symptoms continue to worsen after fall allergen season, please follow-up with your primary care provider, Dr. Earlene PlaterWallace.

## 2016-07-31 NOTE — Progress Notes (Signed)
   S:    Patient arrives in good spirits ambulating without assistance with her wife [place].  Presents for lung function evaluation.  Patient was referred on 07/25/16 by Dr Earlene PlaterWallace and was last seen by Dr Earlene PlaterWallace on 07/25/16.  Patient endorses wheezing on exertion, difficulty exercising because of breathing, and shortness of breath with climbing stairs. She states when walking with her wife sometimes she does have to slow down due to a combination of shortness of breath and her knee pain. These symptoms have been going for the past 3-4 months. She is a former smoker for 21 years with ~10 cigarettes/day, and quit about 1 year ago. History of asthma as a child with "breathing treatments" and she states she has previously been on Advair which was stopped ~10 years ago.  Patient does state that she has does have seasonal allergies and does also feel like her breathing is worse when her coworkers use air fresheners at work. She reports that she does have some nasal dryness and some congestion which she uses an over the counter decongestant for a few days.   Patient reports last dose of albuterol was 2 days ago.   O: See "scanned report" or Documentation Flowsheet (discrete results - PFTs) for  Spirometry results. Patient provided good effort while attempting spirometry.   A/P: Spirometry evaluation with reveals normal lung function. The cause of her wheezing and shortness of breath is unknown at this time but likely due to seasonal allergies or environmental irritants. Patient has been experiencing occasional shortness of breath for 3-4 months and taking albuterol which has improved her breathing. Continue Albuterol as needed for shortness of breath/wheezing.  Will consider inhaled steroid in the future if patient has continued symptoms despite the end of allergy season over the next month.  Instructed her to continue cetirizine and start Saline Nasal Spray PRN. Patient verbalized understanding of results  and education.  Written pt instructions provided.  F/U Clinic visit with Dr Earlene PlaterWallace in 1 month.   Total time in face to face counseling 45 minutes.  Patient seen with Alphonzo Severanceyan Ragan, PharmD Candidate,  Allie BossierApryl Anderson, PharmD PGY1 Resident and Hazle NordmannKelsy Combs, PharmD Resident.

## 2016-07-31 NOTE — Progress Notes (Signed)
Patient ID: Catherine [name], female   DOB: 1981-03-28, 35 y.o.   MRN: [id number] Reviewed: Agree with Dr. Macky LowerKoval's documentation and management.

## 2016-07-31 NOTE — Assessment & Plan Note (Signed)
Spirometry evaluation with reveals normal lung function in patient with history of asthma as a child. The cause of her wheezing and shortness of breath is unknown at this time but likely due to seasonal allergies or environmental irritants. Patient has been experiencing occasional shortness of breath for 3-4 months and taking albuterol which has improved her breathing. Continue Albuterol as needed for shortness of breath/wheezing.  Will consider inhaled steroid in the future if patient has continued symptoms despite the end of allergy season over the next month.  Instructed her to continue cetirizine and start Saline Nasal Spray PRN.

## 2016-08-12 MED FILL — FUROSEMIDE 20 MG TABLET: 20 | 30 days supply | Qty: 30 | Fill #1 | Status: TO

## 2016-08-14 ENCOUNTER — Ambulatory Visit (INDEPENDENT_AMBULATORY_CARE_PROVIDER_SITE_OTHER): Payer: 59 | Admitting: Family Medicine

## 2016-08-14 ENCOUNTER — Encounter: Payer: Self-pay | Admitting: Family Medicine

## 2016-08-14 DIAGNOSIS — J4520 Mild intermittent asthma, uncomplicated: Secondary | ICD-10-CM | POA: Diagnosis not present

## 2016-08-14 NOTE — Assessment & Plan Note (Addendum)
Patient is here is complaints of cough and chest wall tenderness. No evidence of infectious process on exam. Breath sounds were clear bilaterally. Etiology unclear at this time however most likely cause is asthma/reactive airway versus URI versus GERD versus PE. PE deemed highly unlikely at this time. No red flag symptoms/extremity swelling/extremity pain on exam and patient is saturating well on room air. No evidence of tachypnea. - Encouraged use of albuterol inhaler - I have asked the patient follow-up with PCP to discuss HTN and weight loss. - Return precautions provided.  Next: If patient's symptoms persist, get significantly worse, or develops hemoptysis would strongly consider possible etiology of PE.

## 2016-08-14 NOTE — Patient Instructions (Addendum)
It was a pleasure seeing you today in our clinic. Today we discussed your cough. Here is the treatment plan we have discussed and agreed upon together:   - Continue taking your albuterol as needed up to 4 times a day. - You may want to take some over-the-counter Tylenol and/or ibuprofen to help with some of your chest soreness. - Stay well-hydrated with water. - You should be feeling better in the next 3-5 days

## 2016-08-14 NOTE — Progress Notes (Signed)
COUGH Been going on for a week. Worse last night. Having some thoracic pain due to coughing. Still taking zyrtec and zantac. Now having some chest pain, worse with coughing and deep breathing.   Has been coughing for 7 days. Cough is: dry Sputum production: minimal Medications tried: albuterol  Symptoms Runny nose: no Mucous in back of throat: no Throat burning or reflux: no Wheezing or asthma: yes Fever: no Chest Pain: yes; with coughing Shortness of breath: yes Leg swelling: no Hemoptysis: no Weight loss: no  ROS see HPI Smoking Status noted: prior smoker ~10-15 packyear hx  CC, SH/smoking status, and VS noted  Objective: BP (!) 172/94   Pulse 81   Temp 97.6 F (36.4 C) (Oral)   Wt (!) 467 lb (211.8 kg)   BMI 76.77 kg/m  Gen: NAD, alert, cooperative. HEENT: EOMI, PERRLA, MMM, TMs clear bilaterally, no PE slightly erythematous without evidence of exudate, no LAD, no nasal congestion or rhinorrhea present, neck full ROM. CV: Well-perfused. RRR, cap refill <2 sec. Significant reproducible chest wall tenderness with palpation bilaterally. Resp: Non-labored. CTAB, no evidence of wheezing or rhonchi Neuro: Sensation intact throughout.   Assessment and plan:  Asthma Patient is here is complaints of cough and chest wall tenderness. No evidence of infectious process on exam. Breath sounds were clear bilaterally. Etiology unclear at this time however most likely cause is asthma/reactive airway versus URI versus GERD versus PE. PE deemed highly unlikely at this time. No red flag symptoms/extremity swelling/extremity pain on exam and patient is saturating well on room air. No evidence of tachypnea. - Encouraged use of albuterol inhaler - I have asked the patient follow-up with PCP to discuss HTN and weight loss. - Return precautions provided.  Next: If patient's symptoms persist, get significantly worse, or develops hemoptysis would strongly consider possible etiology of  PE.   Kathee DeltonIan D McKeag, MD,MS,  PGY3 08/14/2016 3:59 PM

## 2016-08-26 ENCOUNTER — Ambulatory Visit: Payer: 59 | Admitting: Family Medicine

## 2016-09-02 ENCOUNTER — Ambulatory Visit: Payer: 59 | Admitting: Family Medicine

## 2016-09-08 ENCOUNTER — Ambulatory Visit (INDEPENDENT_AMBULATORY_CARE_PROVIDER_SITE_OTHER): Payer: 59 | Admitting: Internal Medicine

## 2016-09-08 ENCOUNTER — Encounter: Payer: Self-pay | Admitting: Internal Medicine

## 2016-09-08 DIAGNOSIS — J4520 Mild intermittent asthma, uncomplicated: Secondary | ICD-10-CM

## 2016-09-08 MED ORDER — BUDESONIDE 180 MCG/ACT IN AEPB: 1.0000 | INHALATION_SPRAY | Freq: Two times a day (BID) | RESPIRATORY_TRACT | 1 refills | Status: DC

## 2016-09-08 MED FILL — PULMICORT 180 MCG FLEXHALER: 180 | 60 days supply | Qty: 1 | Fill #0

## 2016-09-08 NOTE — Assessment & Plan Note (Signed)
Patient has near normal lung function testing recently however may still have a component of exercise induced reactive airway disease vs seasonal allergies vs. Environmental irritants. Uncontrolled GERD is also a possibility. Will prescribe inhaled steroid given frequency of reported albuterol use. Patient to continue albuterol use prn and monitor frequency of use after starting Pulmicort. Continue Cetirizine and avoid environmental triggers when possible. Patient has appointment with GI on 12/15. Will follow up with patient after this appointment.

## 2016-09-08 NOTE — Patient Instructions (Signed)
Use the Pulmicort 1 puff twice per day even if you are not symptomatic. Pay attention to how your breathing is after starting this medication and how often you need to use your albuterol.   Please follow up with me in January after you have seen GI.   Take Care,   Dr. Earlene PlaterWallace

## 2016-09-08 NOTE — Progress Notes (Signed)
   Subjective:    Catherine [name] - 35 y.o. female MRN [id number]  Date of birth: 1980-12-25  HPI  Catherine [name] is here for follow up from pulmonary function test. Patient has a history of asthma as a child and was also a former smoker for >20 years. Quit over a year ago. Patient found to have near normal lung function on testing. Reports that she is still having wheezing/coughing particularly with exertion and that her symptoms are interfering with her ability to exercise. On a busy day, she uses her albuterol about 5-6 times per day. States that she uses her albuterol most days per week.   She does have history of seasonal allergies. Reports that she is still currently in her typical allergy season. She takes Cetirizine daily. Notes that she has noticed some environmental irritants, such as cleaning sprays, cologne/perfumes that set off coughing fits. She does not use these products at home but often has a difficult time avoiding these things at work.   -  reports that she quit smoking about 19 months ago. Her smoking use included Cigarettes. She started smoking about 19 years ago. She has a 9.00 pack-year smoking history. She has never used smokeless tobacco. - Review of Systems: Per HPI. - Past Medical History: Patient Active Problem List   Diagnosis Date Noted  . Asthma 07/25/2016  . Abdominal pain 06/04/2016  . Otitis, externa, infective 06/04/2016  . Dysuria 03/20/2016  . Gout 11/21/2015  . Obesity, morbid, BMI 50 or higher (HCC) 05/14/2015  . BIPOLAR DISORDER UNSPECIFIED 11/22/2008  . Depression 12/10/2006  . MIGRAINE, UNSPEC., W/O INTRACTABLE MIGRAINE 12/10/2006  . GASTROESOPHAGEAL REFLUX, NO ESOPHAGITIS 12/10/2006  . OSA (obstructive sleep apnea) 12/10/2006   - Medications: reviewed and updated   Objective:   Physical Exam BP 121/75   Pulse 95   Temp 97.9 F (36.6 C) (Oral)   Ht 5\' 5"  (1.651 m)   Wt (!) 468 lb 12.8 oz (212.6 kg)   LMP 09/05/2016   SpO2 98%   BMI  78.01 kg/m  Gen: NAD, alert, cooperative with exam, well-appearing CV: RRR, good S1/S2, no murmur Resp: CTABL, no wheezes, non-labored      Assessment & Plan:   Asthma Patient has near normal lung function testing recently however may still have a component of exercise induced reactive airway disease vs seasonal allergies vs. Environmental irritants. Uncontrolled GERD is also a possibility. Will prescribe inhaled steroid given frequency of reported albuterol use. Patient to continue albuterol use prn and monitor frequency of use after starting Pulmicort. Continue Cetirizine and avoid environmental triggers when possible. Patient has appointment with GI on 12/15. Will follow up with patient after this appointment.     [name], D.O. 09/08/2016, 9:50 AM PGY-2, [facility]

## 2016-09-26 ENCOUNTER — Encounter: Payer: Self-pay | Admitting: Internal Medicine

## 2016-09-26 ENCOUNTER — Encounter: Payer: Self-pay | Admitting: Family Medicine

## 2016-09-26 ENCOUNTER — Ambulatory Visit (INDEPENDENT_AMBULATORY_CARE_PROVIDER_SITE_OTHER): Payer: 59 | Admitting: Family Medicine

## 2016-09-26 ENCOUNTER — Ambulatory Visit (INDEPENDENT_AMBULATORY_CARE_PROVIDER_SITE_OTHER): Payer: 59 | Admitting: Internal Medicine

## 2016-09-26 VITALS — BP 128/80 | HR 80 | Ht 65.0 in | Wt >= 6400 oz

## 2016-09-26 DIAGNOSIS — K219 Gastro-esophageal reflux disease without esophagitis: Secondary | ICD-10-CM

## 2016-09-26 NOTE — Patient Instructions (Signed)
Goals: 1. Water exercise 4 X wk and walking at least 15 minutes 2 X wk.   2. Eat at least 3 REAL meals and 1-2 snacks per day.  Aim for no more than 5 hours between eating.    Eat breakfast within one hour of getting up.   Remember:  Real meal includes at least protein, starch, and veg's.   3. Get vegetables with both lunch and dinner.  Continue to use ones you're familiar with, and expand your horizons as you can.    - Snacks: Experiment with different popcorns to see if you can find one you like with fewer calories.    - For your niece:  [employer].

## 2016-09-26 NOTE — Progress Notes (Signed)
Medical Nutrition Therapy:  Appt start time: 1200 end time:  1245. Spouse: Tabitha  Assessment:  Primary concerns today: Weight management.  Catherine [name] has been walking 15 min 2 X wk, usually stopping to rest on the bench part-way through b/c of knee pain.  She has been doing water exercise 3 X wk.  Yesterday's recall was again an unusual day, so I don't have a firm picture of what [name]'s usual intake is.  She said she has been getting three meals a day on most days, and while she gets veg's at lunch time if she gets a salad at work, this doesn't happen daily.    Catherine [name] went to the intro seminar for bariatric surgery, and found out that she would have to lose 150 lb before being eligible for the procedure.  She feels that if she is able to lose that amount of weight, she should be able to just keep on going with the weight loss.    24-hr recall:  (Up at 8 AM) B (9:45 AM)-  1 fried egg (in butter), 1 sausage patty, 1 slc bread, 1 c juice drink, water Snk ( AM)-  water L ( PM)-  --- Snk ( PM)-  --- D (4 PM)-  1 veg roll, 2 carrots, 2 pc chx drumettes, 3 meatballs, water Snk (9:30)-  1 bag [place]acher popcorn, water Typical day? No.  Was running a lot of errands yesterday.    Progress Towards Goal(s):  In progress.   Nutritional Diagnosis:  No progress noted on NI-5.8.3 Inappropriate intake of types of carbohydrates (specify): (refined carbs) As related to beverages.  As evidenced by frequent intake of sweet drinks.    Intervention:  Nutrition education.  Handouts given during visit include:  AVS  Demonstrated degree of understanding via:  Teach Back  Barriers to learning/adherence to lifestyle change: Longstanding h/o poor diet and exercise pattern.    Monitoring/Evaluation:  Dietary intake, exercise, and body weight in 8 week(s).  No appts available sooner.

## 2016-09-26 NOTE — Progress Notes (Signed)
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 Earlene PlaterWallace regarding GERD and abdominal bloating. Patient is orally obese with a BMI of 78. She reports a 10 year history of problems with GERD as manifested by pyrosis and regurgitation. No dysphagia. She had been on PPI for some time. Problems deteriorated in recent months for which she was placed on ranitidine twice daily. She tells me that on ranitidine therapy she does well. Symptoms are controlled. She does have abdominal bloating and occasional regurgitation. On demand an acids can be helpful. No other GI complaints. She tells me that she is participating in a bariatric program and may be anticipating bariatric surgery  REVIEW OF SYSTEMS:  All non-GI ROS negative except for sinus allergy, anxiety, arthritis  Past Medical History:  Diagnosis Date  . Allergy   . Anxiety   . Arthritis   . Asthma    since baby  . ASTHMA, UNSPECIFIED 12/10/2006   Qualifier: Diagnosis of  By: Abundio MiuMcGregor, Barbara    . CELLULITIS AND ABSCESS OF LEG EXCEPT FOOT 12/30/2007   Qualifier: Diagnosis of  By: Daphine DeutscherMartin FNP, Zena AmosNykedtra    . DEPENDENT EDEMA, LEGS, BILATERAL 11/22/2008   Qualifier: Diagnosis of  By: Barbaraann Barthelankins MD, [place]    . Depression 2003Dx  . FOOT PAIN, RIGHT 11/22/2008   Qualifier: Diagnosis of  By: Barbaraann Barthelankins MD, [place]    . Gastric ulcer   . GERD (gastroesophageal reflux disease)   . Gout   . Hypertension   . KNEE INJURY, LEFT 01/01/2009   Qualifier: Diagnosis of  By: Delrae AlfredMulberry MD, Lanora ManisElizabeth    . Migraine   . ONYCHOMYCOSIS, TOENAILS 11/22/2008   Qualifier: Diagnosis of  By: Barbaraann Barthelankins MD, [place]    . TINEA PEDIS 11/22/2008   Qualifier: Diagnosis of  By: Barbaraann Barthelankins MD, [place]      Past Surgical History:  Procedure Laterality Date  . WISDOM TOOTH EXTRACTION  2008   x4    Social History Catherine [name]  reports that she quit smoking about 20 months ago. Her smoking use  included Cigarettes. She started smoking about 19 years ago. She has a 9.00 pack-year smoking history. She has never used smokeless tobacco. She reports that she drinks about 0.6 oz of alcohol per week . She reports that she does not use drugs.  family history includes Cancer in her mother; Heart attack in her brother; Heart disease in her brother, father, mother, and sister; Hyperlipidemia in her brother, father, and mother; Hypertension in her brother, father, mother, and sister; Lung cancer in her father; Prostate cancer in her father; Stroke in her brother; Sudden death in her brother and father; Thyroid cancer in her sister.  Allergies  Allergen Reactions  . Citrus Swelling       PHYSICAL EXAMINATION: Vital signs: BP 128/80   Pulse 80   Ht 5\' 5"  (1.651 m)   Wt (!) 469 lb (212.7 kg)   LMP 09/05/2016   BMI 78.05 kg/m   Constitutional: Markedly obese but otherwise generally well-appearing, no acute distress Psychiatric: Pleasant, alert and oriented x3, cooperative Eyes: extraocular movements intact, anicteric, conjunctiva pink Mouth: oral pharynx moist, no lesions Neck: supple no lymphadenopathy Cardiovascular: heart regular rate and rhythm, no murmur Lungs: clear to auscultation bilaterally Abdomen: soft, obese, nontender, nondistended, no obvious ascites, no peritoneal signs, normal bowel sounds, no organomegaly Rectal: Omitted Extremities: no clubbing cyanosis or lower extremity edema bilaterally Skin: no  lesions on visible extremities Neuro: No focal deficits. Cranial nerves intact  ASSESSMENT:  #1. GERD without alarm features. Seemingly controlled with ranitidine #2. Morbid obesity  PLAN:  #1. Reflux precautions #2. Literature on GERD and reflux precautions literature provided #3. Weight loss #4. An acids on demand for breakthrough symptoms #5. GI follow-up as needed  A copy of this consultation note has been sent to Dr. Earlene PlaterWallace

## 2016-09-26 NOTE — Patient Instructions (Signed)
Please follow up as needed 

## 2016-10-03 MED FILL — FUROSEMIDE 20 MG TABLET: 20 | 30 days supply | Qty: 30 | Fill #0

## 2016-10-03 MED FILL — raNITIdine HCL 150 MG TABS: 150 | 30 days supply | Qty: 60 | Fill #0

## 2016-10-23 ENCOUNTER — Encounter (INDEPENDENT_AMBULATORY_CARE_PROVIDER_SITE_OTHER): Payer: 59 | Admitting: Family Medicine

## 2016-10-27 ENCOUNTER — Encounter (INDEPENDENT_AMBULATORY_CARE_PROVIDER_SITE_OTHER): Payer: Self-pay | Admitting: Family Medicine

## 2016-10-27 ENCOUNTER — Ambulatory Visit (INDEPENDENT_AMBULATORY_CARE_PROVIDER_SITE_OTHER): Payer: 59 | Admitting: Family Medicine

## 2016-10-27 VITALS — BP 127/85 | HR 86 | Temp 98.7°F | Resp 15 | Ht 66.0 in | Wt >= 6400 oz

## 2016-10-27 DIAGNOSIS — K219 Gastro-esophageal reflux disease without esophagitis: Secondary | ICD-10-CM | POA: Diagnosis not present

## 2016-10-27 DIAGNOSIS — Z9189 Other specified personal risk factors, not elsewhere classified: Secondary | ICD-10-CM

## 2016-10-27 DIAGNOSIS — R5383 Other fatigue: Secondary | ICD-10-CM

## 2016-10-27 DIAGNOSIS — G4730 Sleep apnea, unspecified: Secondary | ICD-10-CM

## 2016-10-27 DIAGNOSIS — R0602 Shortness of breath: Secondary | ICD-10-CM

## 2016-10-27 DIAGNOSIS — Z1389 Encounter for screening for other disorder: Secondary | ICD-10-CM | POA: Diagnosis not present

## 2016-10-27 DIAGNOSIS — Z0289 Encounter for other administrative examinations: Secondary | ICD-10-CM

## 2016-10-27 DIAGNOSIS — Z1331 Encounter for screening for depression: Secondary | ICD-10-CM

## 2016-10-27 NOTE — Progress Notes (Signed)
Office: [phone]  /  Fax: [phone]   HPI:   Chief Complaint: OBESITY  Catherine [name] (MR# [id number]) is a 36 y.o. female who presents on 10/27/2016 for obesity evaluation and treatment. Current BMI is Body mass index is 75.54 kg/m.Catherine [name] has struggled with obesity for years and has been unsuccessful in either losing weight or maintaining long term weight loss. Catherine [name] attended our information session and states she is currently in the action stage of change and ready to dedicate time achieving and maintaining a healthier weight.  Catherine [name] states her family eats meals together she thinks her family will eat healthier with  her her desired weight is 300 she has been heavy most of  her life she started gaining weight in her early 20's her heaviest weight ever was 495 lbs. she is a picky eater and doesn't like to eat healthier foods  she has significant food cravings issues  she snacks frequently in the evenings she skips meals frequently she is frequently drinking liquids with calories she frequently makes poor food choices she has problems with excessive hunger  she frequently eats larger portions than normal  she has binge eating behaviors   Fatigue Catherine [name] feels her energy is lower than it should be. This has worsened with weight gain and has not worsened recently. Catherine [name] admits to daytime somnolence and  reports waking up still tired. Patient is at risk for obstructive sleep apnea. Catherine [name] has a history of symptoms of daytime fatigue, Epworth sleepiness scale and morning headache. Patient generally gets 6 or 7 hours of sleep per night, and states they generally have generally restful sleep. Snoring is present. Apneic episodes are present. Epworth Sleepiness Score is 17. Patient currently uses CPAP, reports removing CPAP after 5 hours due to dry throat.  Dyspnea on exertion Catherine [name] notes increasing shortness of breath with exercising and seems to be worsening over time with  weight gain. She notes getting out of breath sooner with activity than she used to. This has not gotten worse recently. Catherine [name] denies orthopnea.  At risk for diabetes Catherine [name] is at higher than averagerisk for developing diabetes due to her obesity. She currently denies polyuria or polydipsia.  Sleep Apnea Patient uses CPAP 4-5 hours per night. Reports it makes her throat dry, has headaches if she doesn't use CPAP.  GERD Currently on Zantac, wants to lose weight to help with symptoms.  Depression Screen Catherine [name]'s Food and Mood (modified PHQ-9) score was  Depression screen PHQ 2/9 10/27/2016  Decreased Interest 3  Down, Depressed, Hopeless 3  PHQ - 2 Score 6  Altered sleeping 2  Tired, decreased energy 2  Change in appetite 3  Feeling bad or failure about yourself  2  Trouble concentrating 3  Moving slowly or fidgety/restless 3  Suicidal thoughts 2  PHQ-9 Score 23  Some encounter information is confidential and restricted. Go to Review Flowsheets activity to see all data.    ALLERGIES: Allergies  Allergen Reactions  . Citrus Swelling    MEDICATIONS: Current Outpatient Prescriptions on File Prior to Visit  Medication Sig Dispense Refill  . allopurinol (ZYLOPRIM) 100 MG tablet TAKE 1 TABLET BY MOUTH DAILY. 30 tablet 2  . aspirin-acetaminophen-caffeine (EXCEDRIN MIGRAINE) 250-250-65 MG per tablet Take 1 tablet by mouth 2 (two) times daily as needed for migraine. For headache    . budesonide (PULMICORT) 180 MCG/ACT inhaler Inhale 1 puff into the lungs 2 (two) times daily. 1 Inhaler 1  . cetirizine (ZYRTEC) 10 MG tablet Take  1 tablet (10 mg total) by mouth daily. 30 tablet 2  . furosemide (LASIX) 20 MG tablet Take 1 tablet (20 mg total) by mouth daily. 30 tablet 3  . ibuprofen (ADVIL,MOTRIN) 200 MG tablet Take 200 mg by mouth every 6 (six) hours as needed for moderate pain.    Catherine [name] Kitchen. ondansetron (ZOFRAN ODT) 8 MG disintegrating tablet Take 1 tablet (8 mg total) by mouth every 8  (eight) hours as needed for nausea or vomiting. 20 tablet 0  . ranitidine (ZANTAC) 150 MG tablet Take 1 tablet (150 mg total) by mouth 2 (two) times daily. 60 tablet 1  . potassium chloride (K-DUR) 10 MEQ tablet Take 1 tablet (10 mEq total) by mouth daily. (Patient not taking: Reported on 10/27/2016) 30 tablet 5  . traZODone (DESYREL) 50 MG tablet Take 0.5 tablets (25 mg total) by mouth at bedtime as needed for sleep. (Patient not taking: Reported on 10/27/2016) 30 tablet 1   No current facility-administered medications on file prior to visit.     PAST MEDICAL HISTORY: Past Medical History:  Diagnosis Date  . Allergy   . Anxiety   . Arthritis   . Asthma    since baby  . ASTHMA, UNSPECIFIED 12/10/2006   Qualifier: Diagnosis of  By: Abundio MiuMcGregor, Barbara    . CELLULITIS AND ABSCESS OF LEG EXCEPT FOOT 12/30/2007   Qualifier: Diagnosis of  By: Daphine DeutscherMartin FNP, Zena AmosNykedtra    . DEPENDENT EDEMA, LEGS, BILATERAL 11/22/2008   Qualifier: Diagnosis of  By: Barbaraann Barthelankins MD, [place]    . Depression 2003Dx  . Edema   . Food allergy    citris  . FOOT PAIN, RIGHT 11/22/2008   Qualifier: Diagnosis of  By: Barbaraann Barthelankins MD, [place]    . Gastric ulcer   . GERD (gastroesophageal reflux disease)   . Gout   . Hypertension   . Joint pain   . KNEE INJURY, LEFT 01/01/2009   Qualifier: Diagnosis of  By: Delrae AlfredMulberry MD, Lanora ManisElizabeth    . Migraine   . ONYCHOMYCOSIS, TOENAILS 11/22/2008   Qualifier: Diagnosis of  By: Barbaraann Barthelankins MD, [place]    . Shortness of breath   . Sleep apnea   . TINEA PEDIS 11/22/2008   Qualifier: Diagnosis of  By: Barbaraann Barthelankins MD, Benetta SparVictoria      PAST SURGICAL HISTORY: Past Surgical History:  Procedure Laterality Date  . WISDOM TOOTH EXTRACTION  2008   x4    SOCIAL HISTORY: Social History  Substance Use Topics  . Smoking status: Former Smoker    Packs/day: 0.50    Years: 18.00    Types: Cigarettes    Start date: 10/13/1996    Quit date: 01/12/2015  . Smokeless tobacco: Never Used  . Alcohol use 0.6 oz/week     1 Standard drinks or equivalent per week     Comment: rarely drinks    FAMILY HISTORY: Family History  Problem Relation Age of Onset  . Hypertension Mother   . Hyperlipidemia Mother   . Cancer Mother     bone cancer  . Heart disease Mother   . Sudden death Mother   . Stroke Mother   . Thyroid disease Mother   . Sleep apnea Mother   . Obesity Mother   . Sudden death Father   . Hypertension Father   . Heart disease Father   . Hyperlipidemia Father   . Prostate cancer Father   . Lung cancer Father   . Obesity Father   . Alcoholism Father   . Sudden  death Brother   . Hypertension Brother   . Hyperlipidemia Brother   . Heart attack Brother   . Heart disease Brother   . Stroke Brother   . Alcoholism Brother   . Drug abuse Brother   . Heart disease Sister   . Hypertension Sister   . Thyroid cancer Sister   . Diabetes Neg Hx   . Colon cancer Neg Hx   . Stomach cancer Neg Hx   . Esophageal cancer Neg Hx   . Rectal cancer Neg Hx   . Liver cancer Neg Hx     ROS: Review of Systems  Constitutional: Positive for malaise/fatigue.  Eyes:       Floaters  Respiratory: Positive for shortness of breath.   Cardiovascular: Negative for orthopnea.  Gastrointestinal: Positive for heartburn.  Genitourinary: Negative for frequency.  Musculoskeletal: Positive for joint pain and myalgias.  Neurological: Positive for headaches.  Endo/Heme/Allergies: Negative for polydipsia.    PHYSICAL EXAM: Blood pressure 127/85, pulse 86, temperature 98.7 F (37.1 C), temperature source Oral, resp. rate 15, height 5\' 6"  (1.676 m), weight (!) 468 lb (212.3 kg), SpO2 95 %. Body mass index is 75.54 kg/m. Physical Exam  Constitutional: She is oriented to person, place, and time. She appears well-developed and well-nourished.  Cardiovascular: Normal rate.   Pulmonary/Chest: Effort normal.  Abdominal:  Morbid Obesity  Musculoskeletal: She exhibits edema (2+ pitting edema bilateral lower  extremities).  Neurological: She is oriented to person, place, and time.  Skin: Skin is dry.  Psychiatric: She has a normal mood and affect. Her behavior is normal.  Vitals reviewed.   RECENT LABS AND TESTS: BMET    Component Value Date/Time   NA 138 06/04/2016 1155   K 4.0 06/04/2016 1155   CL 106 06/04/2016 1155   CO2 21 06/04/2016 1155   GLUCOSE 83 06/04/2016 1155   BUN 13 06/04/2016 1155   CREATININE 0.90 06/04/2016 1155   CALCIUM 9.0 06/04/2016 1155   GFRNONAA 84 06/04/2016 1155   GFRAA >89 06/04/2016 1155   Lab Results  Component Value Date   HGBA1C 5.0 11/21/2015   No results found for: INSULIN CBC    Component Value Date/Time   WBC 13.4 (H) 06/04/2016 1155   RBC 4.37 06/04/2016 1155   HGB 12.1 06/04/2016 1155   HCT 37.3 06/04/2016 1155   PLT 290 06/04/2016 1155   MCV 85.4 06/04/2016 1155   MCH 27.7 06/04/2016 1155   MCHC 32.4 06/04/2016 1155   RDW 14.3 06/04/2016 1155   LYMPHSABS 4.3 (H) 09/21/2013 1635   MONOABS 1.3 (H) 09/21/2013 1635   EOSABS 0.1 09/21/2013 1635   BASOSABS 0.0 09/21/2013 1635   Iron/TIBC/Ferritin/ %Sat No results found for: IRON, TIBC, FERRITIN, IRONPCTSAT Lipid Panel     Component Value Date/Time   CHOL 128 11/21/2015 1047   TRIG 49 11/21/2015 1047   HDL 36 (L) 11/21/2015 1047   CHOLHDL 3.6 11/21/2015 1047   VLDL 10 11/21/2015 1047   LDLCALC 82 11/21/2015 1047   Hepatic Function Panel     Component Value Date/Time   PROT 6.7 06/04/2016 1155   ALBUMIN 3.6 06/04/2016 1155   AST 16 06/04/2016 1155   ALT 14 06/04/2016 1155   ALKPHOS 59 06/04/2016 1155   BILITOT 0.5 06/04/2016 1155      Component Value Date/Time   TSH 0.96 11/21/2015 1047    ECG  shows NSR with a rate of 80 BPM INDIRECT CALORIMETER done today shows a VO2 of 294 and  a REE of 2045.    ASSESSMENT AND PLAN: Other fatigue - Plan: EKG 12-Lead, CBC With Differential, Comprehensive metabolic panel, Folate, Vitamin B12, Lipid Panel With LDL/HDL Ratio, T3, T4,  free, TSH, VITAMIN D 25 Hydroxy (Vit-D Deficiency, Fractures)  Shortness of breath  Gastroesophageal reflux disease, esophagitis presence not specified  Sleep apnea, unspecified type  Depression screening  At risk for diabetes mellitus - Plan: Insulin, random, Hemoglobin A1c  Morbid obesity (HCC)  PLAN:  Fatigue Catherine [name] was informed that her fatigue may be related to obesity, depression or many other causes. Labs will be ordered, and in the meanwhile Catherine [name] has agreed to work on diet, exercise and weight loss to help with fatigue. Proper sleep hygiene was discussed including the need for 7-8 hours of quality sleep each night. A sleep study was not ordered.   Dyspnea on exertion Catherine [name]'s shortness of breath appears to be obesity related and exercise induced. She has agreed to work on weight loss and gradually increase exercise to treat her exercise induced shortness of breath. If Catherine [name] follows our instructions and loses weight without improvement of her shortness of breath, we will plan to refer to pulmonology. We will monitor this condition regularly. Catherine [name] agrees to this plan.  Diabetes risk counselling Catherine [name] was given extended (at least 15 minutes) diabetes prevention counseling today. She is 36 y.o. female and has risk factors for diabetes including obesity. We discussed intensive lifestyle modifications today with an emphasis on weight loss as well as increasing exercise and decreasing simple carbohydrates in her diet.  Sleep Apnea Encouraged Catherine [name] to contact her sleep specialist for advice on how to humidify. Encouraged weight loss to treat sleep apnea.  GERD Catherine [name] will continue with diet, exercise and weight loss.   Depression Screen Catherine [name] had a positive depression screening. Depression is commonly associated with obesity and often results in emotional eating behaviors. We will monitor this closely and work on CBT to help improve the non-hunger eating patterns.  Referral to Psychology may be required if no improvement is seen as she continues in our clinic.  Morbid Obesity Catherine [name] is currently in the action stage of change and her goal is to continue with weight loss efforts She has agreed to follow the Category 3 plan Catherine [name] has been instructed to work up to a goal of 150 minutes of combined cardio and strengthening exercise per week for weight loss and overall health benefits. We discussed the following Behavioral Modification Stratagies today: increasing lean protein intake, decreasing simple carbohydrates , work on meal planning and easy cooking plans and dealing with family or coworker sabotage  Catherine [name] has agreed to follow up with our clinic in 2 weeks. She was informed of the importance of frequent follow up visits to maximize her success with intensive lifestyle modifications for her multiple health conditions. She was informed we would discuss her lab results at her next visit unless there is a critical issue that needs to be addressed sooner. Catherine [name] agreed to keep her next visit at the agreed upon time to discuss these results.  I, [place] Crane, am acting as scribe for Quillian Quince, MD  I have reviewed the above documentation for accuracy and completeness, and I agree with the above. -Quillian Quince, MD

## 2016-10-28 ENCOUNTER — Other Ambulatory Visit: Payer: Self-pay | Admitting: Internal Medicine

## 2016-10-28 DIAGNOSIS — H6690 Otitis media, unspecified, unspecified ear: Secondary | ICD-10-CM | POA: Diagnosis not present

## 2016-10-28 LAB — COMPREHENSIVE METABOLIC PANEL
ALT: 15 IU/L (ref 0–32)
AST: 16 IU/L (ref 0–40)
Albumin/Globulin Ratio: 1.2 (ref 1.2–2.2)
Albumin: 3.9 g/dL (ref 3.5–5.5)
Alkaline Phosphatase: 73 IU/L (ref 39–117)
BUN/Creatinine Ratio: 13 (ref 9–23)
BUN: 11 mg/dL (ref 6–20)
Bilirubin Total: 0.4 mg/dL (ref 0.0–1.2)
CO2: 23 mmol/L (ref 18–29)
Calcium: 9.2 mg/dL (ref 8.7–10.2)
Chloride: 101 mmol/L (ref 96–106)
Creatinine, Ser: 0.86 mg/dL (ref 0.57–1.00)
GFR calc Af Amer: 102 mL/min/{1.73_m2} (ref >59)
GFR calc non Af Amer: 88 mL/min/{1.73_m2} (ref >59)
Globulin, Total: 3.2 g/dL (ref 1.5–4.5)
Glucose: 84 mg/dL (ref 65–99)
Potassium: 4.4 mmol/L (ref 3.5–5.2)
Sodium: 140 mmol/L (ref 134–144)
Total Protein: 7.1 g/dL (ref 6.0–8.5)

## 2016-10-28 LAB — LIPID PANEL WITH LDL/HDL RATIO
Cholesterol, Total: 152 mg/dL (ref 100–199)
HDL: 43 mg/dL (ref >39)
LDL Calculated: 98 mg/dL (ref 0–99)
LDl/HDL Ratio: 2.3 ratio units (ref 0.0–3.2)
Triglycerides: 57 mg/dL (ref 0–149)
VLDL Cholesterol Cal: 11 mg/dL (ref 5–40)

## 2016-10-28 LAB — CBC WITH DIFFERENTIAL
Basophils Absolute: 0 10*3/uL (ref 0.0–0.2)
Basos: 0 %
EOS (ABSOLUTE): 0.1 10*3/uL (ref 0.0–0.4)
Eos: 1 %
Hematocrit: 37.3 % (ref 34.0–46.6)
Hemoglobin: 12.5 g/dL (ref 11.1–15.9)
Immature Grans (Abs): 0 10*3/uL (ref 0.0–0.1)
Immature Granulocytes: 0 %
Lymphocytes Absolute: 3.4 10*3/uL — ABNORMAL HIGH (ref 0.7–3.1)
Lymphs: 31 %
MCH: 27.9 pg (ref 26.6–33.0)
MCHC: 33.5 g/dL (ref 31.5–35.7)
MCV: 83 fL (ref 79–97)
Monocytes Absolute: 1 10*3/uL — ABNORMAL HIGH (ref 0.1–0.9)
Monocytes: 9 %
Neutrophils Absolute: 6.4 10*3/uL (ref 1.4–7.0)
Neutrophils: 59 %
RBC: 4.48 x10E6/uL (ref 3.77–5.28)
RDW: 15.4 % (ref 12.3–15.4)
WBC: 10.9 10*3/uL — ABNORMAL HIGH (ref 3.4–10.8)

## 2016-10-28 LAB — TSH: TSH: 1.46 u[IU]/mL (ref 0.450–4.500)

## 2016-10-28 LAB — T4, FREE: Free T4: 1.3 ng/dL (ref 0.82–1.77)

## 2016-10-28 LAB — VITAMIN D 25 HYDROXY (VIT D DEFICIENCY, FRACTURES): Vit D, 25-Hydroxy: 11.6 ng/mL — ABNORMAL LOW (ref 30.0–100.0)

## 2016-10-28 LAB — T3: T3, Total: 100 ng/dL (ref 71–180)

## 2016-10-28 LAB — INSULIN, RANDOM: INSULIN: 30.1 u[IU]/mL — ABNORMAL HIGH (ref 2.6–24.9)

## 2016-10-28 LAB — VITAMIN B12: Vitamin B-12: 355 pg/mL (ref 232–1245)

## 2016-10-28 LAB — HEMOGLOBIN A1C
Est. average glucose Bld gHb Est-mCnc: 114 mg/dL
Hgb A1c MFr Bld: 5.6 % (ref 4.8–5.6)

## 2016-10-28 LAB — FOLATE: Folate: 9.9 ng/mL (ref >3.0)

## 2016-10-28 MED FILL — AMOX-CLAV 875-125 MG TABLET: 875-125 | 10 days supply | Qty: 20 | Fill #0

## 2016-11-12 ENCOUNTER — Ambulatory Visit (INDEPENDENT_AMBULATORY_CARE_PROVIDER_SITE_OTHER): Payer: 59 | Admitting: Family Medicine

## 2016-11-12 VITALS — BP 134/78 | HR 92 | Temp 97.9°F | Ht 66.0 in | Wt >= 6400 oz

## 2016-11-12 DIAGNOSIS — E559 Vitamin D deficiency, unspecified: Secondary | ICD-10-CM | POA: Diagnosis not present

## 2016-11-12 DIAGNOSIS — R7303 Prediabetes: Secondary | ICD-10-CM

## 2016-11-12 DIAGNOSIS — Z9189 Other specified personal risk factors, not elsewhere classified: Secondary | ICD-10-CM

## 2016-11-12 MED ORDER — VITAMIN D (ERGOCALCIFEROL) 1.25 MG (50000 UNIT) PO CAPS: 50000.0000 [IU] | ORAL_CAPSULE | ORAL | 0 refills | Status: DC

## 2016-11-12 MED FILL — VIT D2 1.25 MG (50,000 UNIT: 1.25 MG | 28 days supply | Qty: 4 | Fill #0

## 2016-11-12 NOTE — Progress Notes (Signed)
Office: [phone]  /  Fax: [phone]   HPI:   Chief Complaint: OBESITY Catherine [name] is here to discuss her progress with her obesity treatment plan. She is following her eating plan approximately 90 % of the time and states she is exercising 0 minutes 0 times per week. Catherine [name] did well with weight loss in the past 2 weeks and hunger was controlled. Catherine [name] is currently struggling with diet plan starting to get boring, especially for dinner.  Her weight is (!) 457 lb (207.3 kg) today and has had a weight loss of 11 pounds over a period of 2 weeks since her last visit. She has lost 11 lbs since starting treatment with [name].  Vitamin D deficiency Catherine [name] has a new diagnosis of vitamin D deficiency. She is not currently taking vit D, she admits fatigue and denies nausea, vomiting or muscle weakness.  Pre-Diabetes Catherine [name] has a new diagnosis of prediabetes based on her slightly elevated HgA1c but very elevated fasting insulin of 30.1 and was informed this puts her at greater risk of developing diabetes. She is not taking metformin currently and continues to work on diet and exercise to decrease risk of diabetes. She admits polyphagia and denies nausea or hypoglycemia. She believes eating simple carbohydrates did well with her diet.  At risk for diabetes Catherine [name] is at higher than average risk for developing diabetes due to her obesity. She currently denies polyuria or polydipsia.   Wt Readings from Last 500 Encounters:  11/12/16 (!) 457 lb (207.3 kg)  10/27/16 (!) 468 lb (212.3 kg)  09/26/16 (!) 465 lb 9.6 oz (211.2 kg)  09/26/16 (!) 469 lb (212.7 kg)  09/08/16 (!) 468 lb 12.8 oz (212.6 kg)  08/14/16 (!) 467 lb (211.8 kg)  07/31/16 (!) 464 lb 6.4 oz (210.7 kg)  07/28/16 (!) 463 lb 12.8 oz (210.4 kg)  07/25/16 (!) 471 lb (213.6 kg)  06/04/16 (!) 463 lb 9.6 oz (210.3 kg)  04/17/16 (!) 459 lb (208.2 kg)  03/19/16 (!) 460 lb (208.7 kg)  03/03/16 (!) 464 lb 9.6 oz (210.7 kg)  02/15/16 (!)  461 lb 1.6 oz (209.2 kg)  01/29/16 (!) 450 lb 4.8 oz (204.3 kg)  12/25/15 (!) 457 lb 12.8 oz (207.7 kg)  11/21/15 (!) 444 lb 3.2 oz (201.5 kg)  09/26/15 (!) 427 lb (193.7 kg)  07/09/15 (!) 435 lb (197.3 kg)  06/11/15 (!) 431 lb 12.8 oz (195.9 kg)  05/14/15 (!) 431 lb (195.5 kg)  05/08/15 (!) 427 lb (193.7 kg)  10/04/13 (!) 409 lb (185.5 kg)  09/21/13 (!) 422 lb (191.4 kg)  09/12/13 (!) 435 lb 12.8 oz (197.7 kg)  07/29/13 (!) 406 lb (184.2 kg)  06/28/13 (!) 424 lb (192.3 kg)  06/14/13 (!) 417 lb (189.1 kg)  06/01/13 (!) 427 lb (193.7 kg)  05/06/13 (!) 417 lb (189.1 kg)  05/02/13 (!) 432 lb 3.2 oz (196 kg)  04/27/13 (!) 427 lb 12.8 oz (194 kg)     ALLERGIES: Allergies  Allergen Reactions  . Citrus Swelling    MEDICATIONS: Current Outpatient Prescriptions on File Prior to Visit  Medication Sig Dispense Refill  . allopurinol (ZYLOPRIM) 100 MG tablet TAKE 1 TABLET BY MOUTH DAILY. 30 tablet 2  . aspirin-acetaminophen-caffeine (EXCEDRIN MIGRAINE) 250-250-65 MG per tablet Take 1 tablet by mouth 2 (two) times daily as needed for migraine. For headache    . budesonide (PULMICORT) 180 MCG/ACT inhaler Inhale 1 puff into the lungs 2 (two) times daily. 1 Inhaler 1  .  cetirizine (ZYRTEC) 10 MG tablet Take 1 tablet (10 mg total) by mouth daily. 30 tablet 2  . furosemide (LASIX) 20 MG tablet Take 1 tablet (20 mg total) by mouth daily. 30 tablet 3  . ibuprofen (ADVIL,MOTRIN) 200 MG tablet Take 200 mg by mouth every 6 (six) hours as needed for moderate pain.    Marland Kitchen. ondansetron (ZOFRAN ODT) 8 MG disintegrating tablet Take 1 tablet (8 mg total) by mouth every 8 (eight) hours as needed for nausea or vomiting. 20 tablet 0  . potassium chloride (K-DUR) 10 MEQ tablet Take 1 tablet (10 mEq total) by mouth daily. 30 tablet 5  . ranitidine (ZANTAC) 150 MG tablet Take 1 tablet (150 mg total) by mouth 2 (two) times daily. 60 tablet 1  . traZODone (DESYREL) 50 MG tablet Take 0.5 tablets (25 mg total) by mouth  at bedtime as needed for sleep. 30 tablet 1   No current facility-administered medications on file prior to visit.     PAST MEDICAL HISTORY: Past Medical History:  Diagnosis Date  . Allergy   . Anxiety   . Arthritis   . Asthma    since baby  . ASTHMA, UNSPECIFIED 12/10/2006   Qualifier: Diagnosis of  By: Abundio MiuMcGregor, Barbara    . CELLULITIS AND ABSCESS OF LEG EXCEPT FOOT 12/30/2007   Qualifier: Diagnosis of  By: Daphine DeutscherMartin FNP, Zena AmosNykedtra    . DEPENDENT EDEMA, LEGS, BILATERAL 11/22/2008   Qualifier: Diagnosis of  By: Barbaraann Barthelankins MD, [place]    . Depression 2003Dx  . Edema   . Food allergy    citris  . FOOT PAIN, RIGHT 11/22/2008   Qualifier: Diagnosis of  By: Barbaraann Barthelankins MD, [place]    . Gastric ulcer   . GERD (gastroesophageal reflux disease)   . Gout   . Hypertension   . Joint pain   . KNEE INJURY, LEFT 01/01/2009   Qualifier: Diagnosis of  By: Delrae AlfredMulberry MD, Lanora ManisElizabeth    . Migraine   . ONYCHOMYCOSIS, TOENAILS 11/22/2008   Qualifier: Diagnosis of  By: Barbaraann Barthelankins MD, [place]    . Shortness of breath   . Sleep apnea   . TINEA PEDIS 11/22/2008   Qualifier: Diagnosis of  By: Barbaraann Barthelankins MD, Benetta SparVictoria      PAST SURGICAL HISTORY: Past Surgical History:  Procedure Laterality Date  . WISDOM TOOTH EXTRACTION  2008   x4    SOCIAL HISTORY: Social History  Substance Use Topics  . Smoking status: Former Smoker    Packs/day: 0.50    Years: 18.00    Types: Cigarettes    Start date: 10/13/1996    Quit date: 01/12/2015  . Smokeless tobacco: Never Used  . Alcohol use 0.6 oz/week    1 Standard drinks or equivalent per week     Comment: rarely drinks    FAMILY HISTORY: Family History  Problem Relation Age of Onset  . Hypertension Mother   . Hyperlipidemia Mother   . Cancer Mother     bone cancer  . Heart disease Mother   . Sudden death Mother   . Stroke Mother   . Thyroid disease Mother   . Sleep apnea Mother   . Obesity Mother   . Sudden death Father   . Hypertension Father   . Heart  disease Father   . Hyperlipidemia Father   . Prostate cancer Father   . Lung cancer Father   . Obesity Father   . Alcoholism Father   . Sudden death Brother   . Hypertension  Brother   . Hyperlipidemia Brother   . Heart attack Brother   . Heart disease Brother   . Stroke Brother   . Alcoholism Brother   . Drug abuse Brother   . Heart disease Sister   . Hypertension Sister   . Thyroid cancer Sister   . Diabetes Neg Hx   . Colon cancer Neg Hx   . Stomach cancer Neg Hx   . Esophageal cancer Neg Hx   . Rectal cancer Neg Hx   . Liver cancer Neg Hx     ROS: Review of Systems  Constitutional: Positive for malaise/fatigue and weight loss.  Gastrointestinal: Negative for nausea and vomiting.  Genitourinary: Negative for frequency.  Musculoskeletal:       Negative Muscle Weakness  Endo/Heme/Allergies: Negative for polydipsia.       Positive polyphagia Negative hypoglycemia    PHYSICAL EXAM: Blood pressure 134/78, pulse 92, temperature 97.9 F (36.6 C), temperature source Oral, height 5\' 6"  (1.676 m), weight (!) 457 lb (207.3 kg), SpO2 97 %. Body mass index is 73.76 kg/m. Physical Exam  Constitutional: She is oriented to person, place, and time. She appears well-developed and well-nourished.  Cardiovascular: Normal rate.   Pulmonary/Chest: Effort normal.  Musculoskeletal: Normal range of motion. She exhibits edema (trace edema bilateral lower extremeties).  Neurological: She is oriented to person, place, and time.  Skin: Skin is warm and dry.  Psychiatric: She has a normal mood and affect. Her behavior is normal.  Vitals reviewed.   RECENT LABS AND TESTS: BMET    Component Value Date/Time   NA 140 10/27/2016 1058   K 4.4 10/27/2016 1058   CL 101 10/27/2016 1058   CO2 23 10/27/2016 1058   GLUCOSE 84 10/27/2016 1058   GLUCOSE 83 06/04/2016 1155   BUN 11 10/27/2016 1058   CREATININE 0.86 10/27/2016 1058   CREATININE 0.90 06/04/2016 1155   CALCIUM 9.2 10/27/2016 1058    GFRNONAA 88 10/27/2016 1058   GFRNONAA 84 06/04/2016 1155   GFRAA 102 10/27/2016 1058   GFRAA >89 06/04/2016 1155   Lab Results  Component Value Date   HGBA1C 5.6 10/27/2016   HGBA1C 5.0 11/21/2015   Lab Results  Component Value Date   INSULIN 30.1 (H) 10/27/2016   CBC    Component Value Date/Time   WBC 10.9 (H) 10/27/2016 1058   WBC 13.4 (H) 06/04/2016 1155   RBC 4.48 10/27/2016 1058   RBC 4.37 06/04/2016 1155   HGB 12.1 06/04/2016 1155   HCT 37.3 10/27/2016 1058   PLT 290 06/04/2016 1155   MCV 83 10/27/2016 1058   MCH 27.9 10/27/2016 1058   MCH 27.7 06/04/2016 1155   MCHC 33.5 10/27/2016 1058   MCHC 32.4 06/04/2016 1155   RDW 15.4 10/27/2016 1058   LYMPHSABS 3.4 (H) 10/27/2016 1058   MONOABS 1.3 (H) 09/21/2013 1635   EOSABS 0.1 10/27/2016 1058   BASOSABS 0.0 10/27/2016 1058   Iron/TIBC/Ferritin/ %Sat No results found for: IRON, TIBC, FERRITIN, IRONPCTSAT Lipid Panel     Component Value Date/Time   CHOL 152 10/27/2016 1058   TRIG 57 10/27/2016 1058   HDL 43 10/27/2016 1058   CHOLHDL 3.6 11/21/2015 1047   VLDL 10 11/21/2015 1047   LDLCALC 98 10/27/2016 1058   Hepatic Function Panel     Component Value Date/Time   PROT 7.1 10/27/2016 1058   ALBUMIN 3.9 10/27/2016 1058   AST 16 10/27/2016 1058   ALT 15 10/27/2016 1058   ALKPHOS 73 10/27/2016  1058   BILITOT 0.4 10/27/2016 1058      Component Value Date/Time   TSH 1.460 10/27/2016 1058   TSH 0.96 11/21/2015 1047    ASSESSMENT AND PLAN: Vitamin D deficiency - Plan: Vitamin D, Ergocalciferol, (DRISDOL) 50000 units CAPS capsule  Prediabetes  At risk for diabetes mellitus  Morbid obesity (HCC)  PLAN:  Vitamin D Deficiency Catherine [name] was informed that low vitamin D levels contributes to fatigue and are associated with obesity, breast, and colon cancer. She agrees to start to take prescription Vit D @50 ,000 IU #4 with no refills every week and will re-check labs in 3 months and will follow up for  routine testing of vitamin D, at least 2-3 times per year. She was informed of the risk of over-replacement of vitamin D and agrees to not increase her dose unless he discusses this with [name] first.  Pre-Diabetes Catherine [name] will continue to work on weight loss, exercise, and decreasing simple carbohydrates in her diet to help decrease the risk of diabetes. We dicussed metformin including benefits and risks. She was informed that eating too many simple carbohydrates or too many calories at one sitting increases the likelihood of GI side effects. Catherine [name] will defer metformin for now and a prescription was not written today. We will re-check labs in 3 months and Catherine [name] agreed to follow up with [name] as directed to monitor her progress.  Diabetes risk counselling Catherine [name] was given extended (at least 30 minutes) diabetes prevention counseling today. She is 36 y.o. female and has risk factors for diabetes including obesity. We discussed intensive lifestyle modifications today with an emphasis on weight loss as well as increasing exercise and decreasing simple carbohydrates in her diet.  Obesity Catherine [name] is currently in the action stage of change.As such, her goal is to continue with weight loss efforts. She has agreed to keep a food journal with 400-650 calories and 40 grams of protein at supper daily and follow the Category 3 plan Catherine [name] has been instructed to work up to a goal of 150 minutes of combined cardio and strengthening exercise per week for weight loss and overall health benefits. We discussed the following Behavioral Modification Stratagies today: increasing lean protein intake, decreasing simple carbohydrates , increasing vegetables, increasing lower sugar fruits and work on meal planning and easy cooking plans and how to journal on MyFitnesspal. Daviana is losing weight slightly faster than ideal and is at risk of lowering her metabolic rate. We will give her more dinner options and increase dinner  calories by 200.  Vannessa has agreed to follow up with our clinic in 2 weeks. She was informed of the importance of frequent follow up visits to maximize her success with intensive lifestyle modifications for her multiple health conditions.  I, [place] Crane, am acting as scribe for Quillian Quince, MD  I have reviewed the above documentation for accuracy and completeness, and I agree with the above. -Quillian Quince, MD

## 2016-11-13 ENCOUNTER — Encounter (INDEPENDENT_AMBULATORY_CARE_PROVIDER_SITE_OTHER): Payer: Self-pay | Admitting: Family Medicine

## 2016-11-13 ENCOUNTER — Other Ambulatory Visit: Payer: Self-pay | Admitting: Internal Medicine

## 2016-11-13 NOTE — Telephone Encounter (Signed)
Pt needs a refill on Zyrtec. Pharm on file is correct. ep

## 2016-11-14 MED ORDER — CETIRIZINE HCL 10 MG PO TABS: 10.0000 mg | ORAL_TABLET | Freq: Every day | ORAL | 2 refills | Status: DC

## 2016-11-18 ENCOUNTER — Ambulatory Visit (INDEPENDENT_AMBULATORY_CARE_PROVIDER_SITE_OTHER): Payer: 59 | Admitting: Family Medicine

## 2016-11-18 ENCOUNTER — Encounter: Payer: Self-pay | Admitting: Family Medicine

## 2016-11-18 NOTE — Patient Instructions (Addendum)
-   Your eating schedule is now very consistent, which is great!  This will be helpful to your continued success at managing both your appetite and your weight.    - Preventing boredom with your food choices:   - Increase variety of protein foods (chicken: roasted/baked/stewed, ground chicken; beef or pork tenderloin; any fish, including fresh, frozen, or canned; eggs; dairy foods)  - Increase variety of veg's: Try roasted veg's (spray w/ olive oil, bake at 425degrees); vegetable soups; salads.   - Snacks: Fresh fruit, especially apples, berries, and pears; small portion low-fat popcorn; whole-grain crackers with low-fat cheese or whipped pb; 1/2 sandwich.   - Continue to complete your Goals Sheet, and bring to follow-up on Mar 6 at 9 AM.

## 2016-11-18 NOTE — Progress Notes (Signed)
Medical Nutrition Therapy:  Appt start time: 0900 end time:  1000. Spouse: Tabitha  Assessment:  Primary concerns today: Weight management.  [name] has been seeing Dr. Quillian Quincearen Beasley for weight management.  She has been following the plan Dr. Dalbert GarnetBeasley provided, which entails reducing refined carb's, and increasing fruits and veg's, with an emphasis on protein at each meal.  Increasing physical activity is on hold at least until DeweyJansala sees Dr. Dalbert GarnetBeasley next week; both knee pain and SOB has been a concern, although [name] has noticed that SOB has improved even with ADLs.    [name] is monitoring food intake with MyFitnessPal; kcal intake has been 1200-1600 kcal/day, depending on if she gets snacks.  She said she has found planning and food prep to not be too difficult.  Efforts now are focused on learning to expand taste preferences, and to incorporate a wider variety of vegetables especially.    24-hr recall suggests intake of ~1565 kcal:  (Up at 5:30 AM) B (8 AM)-  3 boiled eggs, 1 c water  225 Snk (12 PM)-  100-kcal yogurt pretzel 100 L (1:30 PM)-  4 oz chx, 2 slc 45-kcal bread, 1 c sk milk, 1 apple   460 Snk (2:30 PM)-1 low-fat str chs, water   45   Snk (5:30 PM)-2 slc 45-kcal bread, water   90 D (9 PM)-  8 oz chx, 1/2 c rice w/ broccoli, 2/3 c peas, car's, corn, 1 slc 45-kcal bread, water Snk ( PM)-  Water    645 Typical day? Yes.    Progress Towards Goal(s):  In progress.   Nutritional Diagnosis:  Significant progress noted on NI-5.8.3 Inappropriate intake of types of carbohydrates (specify): (refined carbs) As related to beverages.  As evidenced by water as only beverage reported.    Intervention:  Nutrition education.  Handouts given during visit include:  AVS  Revised Goals Sheet  Demonstrated degree of understanding via:  Teach Back  Barriers to learning/adherence to lifestyle change: Longstanding h/o poor diet and exercise pattern.    Monitoring/Evaluation:  Dietary  intake, exercise, and body weight in 4 week(s).  [name] will continue to see Dr. Dalbert GarnetBeasley 2 X mo, and me 1 X mo for now.

## 2016-11-21 ENCOUNTER — Ambulatory Visit (INDEPENDENT_AMBULATORY_CARE_PROVIDER_SITE_OTHER): Payer: 59 | Admitting: Family Medicine

## 2016-11-21 ENCOUNTER — Encounter: Payer: Self-pay | Admitting: Family Medicine

## 2016-11-21 VITALS — BP 126/78 | HR 87 | Temp 97.9°F | Ht 66.0 in | Wt >= 6400 oz

## 2016-11-21 DIAGNOSIS — J028 Acute pharyngitis due to other specified organisms: Secondary | ICD-10-CM

## 2016-11-21 DIAGNOSIS — J029 Acute pharyngitis, unspecified: Secondary | ICD-10-CM | POA: Diagnosis not present

## 2016-11-21 DIAGNOSIS — B9789 Other viral agents as the cause of diseases classified elsewhere: Secondary | ICD-10-CM | POA: Insufficient documentation

## 2016-11-21 LAB — POCT RAPID STREP A (OFFICE): Rapid Strep A Screen: NEGATIVE

## 2016-11-21 MED ORDER — BENZONATATE 100 MG PO CAPS: 100.0000 mg | ORAL_CAPSULE | Freq: Two times a day (BID) | ORAL | 0 refills | Status: DC | PRN

## 2016-11-21 MED FILL — BENZONATATE 100 MG CAP: 100 | 10 days supply | Qty: 20 | Fill #0

## 2016-11-21 NOTE — Progress Notes (Signed)
Subjective:    Patient ID: Catherine [name] , female   DOB: Apr 04, 1981 , 36 y.o..   MRN: [id number]  HPI  Catherine [name] is here for a same day visit for:  SORE THROAT  Sore throat began 3 days ago. Pain is: in her throat Medications tried: Tylenol. Recently given antibiotics for a left ear infection Strep throat exposure: No STD exposure: no  Symptoms Fever: No Cough: yes, intermittent. Productive of clear sputum Runny nose: no Muscle aches: yes Swollen Glands: no Trouble breathing: no Drooling: no Weight loss: no  Patient believes could be caused by: daughter is sick at home, she thinks she has the same thing.  Review of Symptoms - see HPI PMH - Smoking status noted.     Review of Systems: Per HPI. All other systems reviewed and are negative.  Past Medical History: Patient Active Problem List   Diagnosis Date Noted  . Sore throat (viral) 11/21/2016  . Vitamin D deficiency 11/12/2016  . Prediabetes 11/12/2016  . Asthma 07/25/2016  . Abdominal pain 06/04/2016  . Otitis, externa, infective 06/04/2016  . Dysuria 03/20/2016  . Gout 11/21/2015  . Obesity, morbid, BMI 50 or higher (HCC) 05/14/2015  . BIPOLAR DISORDER UNSPECIFIED 11/22/2008  . Depression 12/10/2006  . MIGRAINE, UNSPEC., W/O INTRACTABLE MIGRAINE 12/10/2006  . GASTROESOPHAGEAL REFLUX, NO ESOPHAGITIS 12/10/2006  . OSA (obstructive sleep apnea) 12/10/2006    Medications: reviewed and updated Current Outpatient Prescriptions  Medication Sig Dispense Refill  . allopurinol (ZYLOPRIM) 100 MG tablet TAKE 1 TABLET BY MOUTH DAILY. 30 tablet 2  . aspirin-acetaminophen-caffeine (EXCEDRIN MIGRAINE) 250-250-65 MG per tablet Take 1 tablet by mouth 2 (two) times daily as needed for migraine. For headache    . benzonatate (TESSALON) 100 MG capsule Take 1 capsule (100 mg total) by mouth 2 (two) times daily as needed for cough. 20 capsule 0  . budesonide (PULMICORT) 180 MCG/ACT inhaler Inhale 1 puff into the  lungs 2 (two) times daily. 1 Inhaler 1  . cetirizine (ZYRTEC) 10 MG tablet Take 1 tablet (10 mg total) by mouth daily. 30 tablet 2  . furosemide (LASIX) 20 MG tablet Take 1 tablet (20 mg total) by mouth daily. 30 tablet 3  . ibuprofen (ADVIL,MOTRIN) 200 MG tablet Take 200 mg by mouth every 6 (six) hours as needed for moderate pain.    Marland Kitchen ondansetron (ZOFRAN ODT) 8 MG disintegrating tablet Take 1 tablet (8 mg total) by mouth every 8 (eight) hours as needed for nausea or vomiting. 20 tablet 0  . potassium chloride (K-DUR) 10 MEQ tablet Take 1 tablet (10 mEq total) by mouth daily. 30 tablet 5  . ranitidine (ZANTAC) 150 MG tablet Take 1 tablet (150 mg total) by mouth 2 (two) times daily. 60 tablet 1  . traZODone (DESYREL) 50 MG tablet Take 0.5 tablets (25 mg total) by mouth at bedtime as needed for sleep. (Patient not taking: Reported on 11/18/2016) 30 tablet 1  . Vitamin D, Ergocalciferol, (DRISDOL) 50000 units CAPS capsule Take 1 capsule (50,000 Units total) by mouth every 7 (seven) days. 4 capsule 0   No current facility-administered medications for this visit.     Social Hx:  reports that she quit smoking about 22 months ago. Her smoking use included Cigarettes. She started smoking about 20 years ago. She has a 9.00 pack-year smoking history. She has never used smokeless tobacco.   Objective:   BP 126/78   Pulse 87   Temp 97.9 F (36.6 C) (Oral)  Ht 5\' 6"  (1.676 m)   Wt (!) 460 lb 9.6 oz (208.9 kg)   LMP 11/04/2016   SpO2 98%   BMI 74.34 kg/m  Physical Exam  Gen: NAD, alert, cooperative with exam, Morbidly obese HEENT:     Head: Normocephalic, atraumatic    Neck: No masses palpated. No goiter. No lymphadenopathy     Ears: External ears normal, no drainage.Tympanic membranes intact on right, normal light reflex bilaterally on right, no erythema or bulging on right. Left tympanic membrane intact with some fluid behind the ear. No erythema and no bulging    Eyes: PERRLA, EOMI, sclera  white, normal conjunctiva    Nose: nasal turbinates moist, no nasal discharge    Throat: moist mucus membranes, positive pharyngeal erythema, white  tonsillar exudate bilaterally with swollen tonsils but they are not touching. Airway is patent Cardiac: Regular rate and rhythm Respiratory: Clear to auscultation bilaterally, no wheezes, non-labored breathing Psych: good insight, normal mood and affect   Assessment & Plan:  Sore throat (viral) Likely viral etiology. Rapid strep negative. No signs of acute otitis media although left ear does have some fluid behind it. She notes that she was recently given antibiotics for an ear infection.  - Supportive care: warm tea with honey, cough drops, Tylenol or Ibuprofen PRN - Return precautions discussed   Anders Simmondshristina Javari Bufkin, MD [facility], PGY-2

## 2016-11-21 NOTE — Assessment & Plan Note (Addendum)
Likely viral etiology. Rapid strep negative. No signs of acute otitis media although left ear does have some fluid behind it. She notes that she was recently given antibiotics for an ear infection.  - Supportive care: warm tea with honey, cough drops, Tylenol or Ibuprofen PRN - Return precautions discussed

## 2016-11-21 NOTE — Patient Instructions (Addendum)
Thank you for coming in today, it was so nice to see you! Today we talked about:    Sore throat, cough, headache, body aches. You likely have a virus. Your strep throat test was negative  Take 500 mg of Tylenol or 400 mg of ibuprofen every 6 hours as needed while you're sick  Try warm tea with honey to soothe your throat  Plenty of rest and keep hydrated. He wanted to drink enough fluids to her your urine is clear to light yellow.  Please follow up in the next couple days if you get worse.   If you have any questions or concerns, please do not hesitate to call the office at [phone]. You can also message me directly via MyChart.   Sincerely,  Anders Simmondshristina Gambino, MD

## 2016-11-26 ENCOUNTER — Ambulatory Visit (INDEPENDENT_AMBULATORY_CARE_PROVIDER_SITE_OTHER): Payer: 59 | Admitting: Family Medicine

## 2016-11-26 VITALS — BP 133/93 | HR 92 | Temp 98.6°F | Ht 66.0 in | Wt >= 6400 oz

## 2016-11-26 DIAGNOSIS — R030 Elevated blood-pressure reading, without diagnosis of hypertension: Secondary | ICD-10-CM

## 2016-11-26 DIAGNOSIS — E559 Vitamin D deficiency, unspecified: Secondary | ICD-10-CM | POA: Diagnosis not present

## 2016-11-26 DIAGNOSIS — Z9189 Other specified personal risk factors, not elsewhere classified: Secondary | ICD-10-CM

## 2016-11-26 MED ORDER — VITAMIN D (ERGOCALCIFEROL) 1.25 MG (50000 UNIT) PO CAPS: 50000.0000 [IU] | ORAL_CAPSULE | ORAL | 0 refills | Status: DC

## 2016-11-27 NOTE — Progress Notes (Signed)
Office: [phone]  /  Fax: [phone]   HPI:   Chief Complaint: OBESITY Catherine [name] is here to discuss her progress with her obesity treatment plan. She is following her eating plan approximately 95 % of the time and states she is exercising 0 minutes 0 times per week. Catherine [name] continues to do well with weight loss, even with some celebration eating over her birthday yesterday. She was allowed to journal for dinner and has done well. Her weight is (!) 449 lb (203.7 kg) today and has had a weight loss of 8 pounds over a period of 2 weeks since her last visit. She has lost 19 lbs since starting treatment with us.  Vitamin D deficiency Catherine [name] has a diagnosis of vitamin D deficiency. She is currently taking vit D, she is not yet at goal. She denies nausea, vomiting or muscle weakness.  Elevated Blood pressure Without Hypertension Catherine [name] has elevated blood pressure today at 133/93 without diagnosis of hypertension. She denies headache or chest pain. This may be white coat syndrome.  At risk for cardiovascular disease Catherine [name] is at a higher than average risk for cardiovascular disease due to obesity. She currently denies any chest pain.    Wt Readings from Last 500 Encounters:  11/26/16 (!) 449 lb (203.7 kg)  11/21/16 (!) 460 lb 9.6 oz (208.9 kg)  11/18/16 (!) 458 lb 9.6 oz (208 kg)  11/12/16 (!) 457 lb (207.3 kg)  10/27/16 (!) 468 lb (212.3 kg)  09/26/16 (!) 465 lb 9.6 oz (211.2 kg)  09/26/16 (!) 469 lb (212.7 kg)  09/08/16 (!) 468 lb 12.8 oz (212.6 kg)  08/14/16 (!) 467 lb (211.8 kg)  07/31/16 (!) 464 lb 6.4 oz (210.7 kg)  07/28/16 (!) 463 lb 12.8 oz (210.4 kg)  07/25/16 (!) 471 lb (213.6 kg)  06/04/16 (!) 463 lb 9.6 oz (210.3 kg)  04/17/16 (!) 459 lb (208.2 kg)  03/19/16 (!) 460 lb (208.7 kg)  03/03/16 (!) 464 lb 9.6 oz (210.7 kg)  02/15/16 (!) 461 lb 1.6 oz (209.2 kg)  01/29/16 (!) 450 lb 4.8 oz (204.3 kg)  12/25/15 (!) 457 lb 12.8 oz (207.7 kg)  11/21/15 (!) 444 lb 3.2  oz (201.5 kg)  09/26/15 (!) 427 lb (193.7 kg)  07/09/15 (!) 435 lb (197.3 kg)  06/11/15 (!) 431 lb 12.8 oz (195.9 kg)  05/14/15 (!) 431 lb (195.5 kg)  05/08/15 (!) 427 lb (193.7 kg)  10/04/13 (!) 409 lb (185.5 kg)  09/21/13 (!) 422 lb (191.4 kg)  09/12/13 (!) 435 lb 12.8 oz (197.7 kg)  07/29/13 (!) 406 lb (184.2 kg)  06/28/13 (!) 424 lb (192.3 kg)  06/14/13 (!) 417 lb (189.1 kg)  06/01/13 (!) 427 lb (193.7 kg)  05/06/13 (!) 417 lb (189.1 kg)  05/02/13 (!) 432 lb 3.2 oz (196 kg)  04/27/13 (!) 427 lb 12.8 oz (194 kg)     ALLERGIES: Allergies  Allergen Reactions  . Citrus Swelling    MEDICATIONS: Current Outpatient Prescriptions on File Prior to Visit  Medication Sig Dispense Refill  . allopurinol (ZYLOPRIM) 100 MG tablet TAKE 1 TABLET BY MOUTH DAILY. 30 tablet 2  . aspirin-acetaminophen-caffeine (EXCEDRIN MIGRAINE) 250-250-65 MG per tablet Take 1 tablet by mouth 2 (two) times daily as needed for migraine. For headache    . benzonatate (TESSALON) 100 MG capsule Take 1 capsule (100 mg total) by mouth 2 (two) times daily as needed for cough. 20 capsule 0  . budesonide (PULMICORT) 180 MCG/ACT inhaler Inhale 1 puff into  the lungs 2 (two) times daily. 1 Inhaler 1  . cetirizine (ZYRTEC) 10 MG tablet Take 1 tablet (10 mg total) by mouth daily. 30 tablet 2  . furosemide (LASIX) 20 MG tablet Take 1 tablet (20 mg total) by mouth daily. 30 tablet 3  . ibuprofen (ADVIL,MOTRIN) 200 MG tablet Take 200 mg by mouth every 6 (six) hours as needed for moderate pain.    Marland Kitchen ondansetron (ZOFRAN ODT) 8 MG disintegrating tablet Take 1 tablet (8 mg total) by mouth every 8 (eight) hours as needed for nausea or vomiting. 20 tablet 0  . potassium chloride (K-DUR) 10 MEQ tablet Take 1 tablet (10 mEq total) by mouth daily. 30 tablet 5  . ranitidine (ZANTAC) 150 MG tablet Take 1 tablet (150 mg total) by mouth 2 (two) times daily. 60 tablet 1  . traZODone (DESYREL) 50 MG tablet Take 0.5 tablets (25 mg total) by  mouth at bedtime as needed for sleep. 30 tablet 1   No current facility-administered medications on file prior to visit.     PAST MEDICAL HISTORY: Past Medical History:  Diagnosis Date  . Allergy   . Anxiety   . Arthritis   . Asthma    since baby  . ASTHMA, UNSPECIFIED 12/10/2006   Qualifier: Diagnosis of  By: Abundio Miu    . CELLULITIS AND ABSCESS OF LEG EXCEPT FOOT 12/30/2007   Qualifier: Diagnosis of  By: Daphine Deutscher FNP, Zena Amos    . DEPENDENT EDEMA, LEGS, BILATERAL 11/22/2008   Qualifier: Diagnosis of  By: Barbaraann Barthel MD, [place]    . Depression 2003Dx  . Edema   . Food allergy    citris  . FOOT PAIN, RIGHT 11/22/2008   Qualifier: Diagnosis of  By: Barbaraann Barthel MD, [place]    . Gastric ulcer   . GERD (gastroesophageal reflux disease)   . Gout   . Hypertension   . Joint pain   . KNEE INJURY, LEFT 01/01/2009   Qualifier: Diagnosis of  By: Delrae Alfred MD, Lanora Manis    . Migraine   . ONYCHOMYCOSIS, TOENAILS 11/22/2008   Qualifier: Diagnosis of  By: Barbaraann Barthel MD, [place]    . Shortness of breath   . Sleep apnea   . TINEA PEDIS 11/22/2008   Qualifier: Diagnosis of  By: Barbaraann Barthel MD, Benetta Spar      PAST SURGICAL HISTORY: Past Surgical History:  Procedure Laterality Date  . WISDOM TOOTH EXTRACTION  2008   x4    SOCIAL HISTORY: Social History  Substance Use Topics  . Smoking status: Former Smoker    Packs/day: 0.50    Years: 18.00    Types: Cigarettes    Start date: 10/13/1996    Quit date: 01/12/2015  . Smokeless tobacco: Never Used  . Alcohol use 0.6 oz/week    1 Standard drinks or equivalent per week     Comment: rarely drinks    FAMILY HISTORY: Family History  Problem Relation Age of Onset  . Hypertension Mother   . Hyperlipidemia Mother   . Cancer Mother     bone cancer  . Heart disease Mother   . Sudden death Mother   . Stroke Mother   . Thyroid disease Mother   . Sleep apnea Mother   . Obesity Mother   . Sudden death Father   . Hypertension Father   . Heart  disease Father   . Hyperlipidemia Father   . Prostate cancer Father   . Lung cancer Father   . Obesity Father   . Alcoholism  Father   . Sudden death Brother   . Hypertension Brother   . Hyperlipidemia Brother   . Heart attack Brother   . Heart disease Brother   . Stroke Brother   . Alcoholism Brother   . Drug abuse Brother   . Heart disease Sister   . Hypertension Sister   . Thyroid cancer Sister   . Diabetes Neg Hx   . Colon cancer Neg Hx   . Stomach cancer Neg Hx   . Esophageal cancer Neg Hx   . Rectal cancer Neg Hx   . Liver cancer Neg Hx     ROS: Review of Systems  Cardiovascular: Negative for chest pain.  Gastrointestinal: Negative for nausea and vomiting.  Musculoskeletal:       Negative Muscle Weakness  Neurological: Negative for headaches.    PHYSICAL EXAM: Blood pressure (!) 133/93, pulse 92, temperature 98.6 F (37 C), temperature source Oral, height 5\' 6"  (1.676 m), weight (!) 449 lb (203.7 kg), last menstrual period 11/04/2016, SpO2 96 %. Body mass index is 72.47 kg/m. Physical Exam  Constitutional: She is oriented to person, place, and time. She appears well-developed and well-nourished.  Cardiovascular: Normal rate.   Pulmonary/Chest: Effort normal.  Abdominal: Soft.  Musculoskeletal: Normal range of motion.  Neurological: She is oriented to person, place, and time.  Skin: Skin is warm and dry.  Psychiatric: She has a normal mood and affect. Her behavior is normal.  Vitals reviewed.   RECENT LABS AND TESTS: BMET    Component Value Date/Time   NA 140 10/27/2016 1058   K 4.4 10/27/2016 1058   CL 101 10/27/2016 1058   CO2 23 10/27/2016 1058   GLUCOSE 84 10/27/2016 1058   GLUCOSE 83 06/04/2016 1155   BUN 11 10/27/2016 1058   CREATININE 0.86 10/27/2016 1058   CREATININE 0.90 06/04/2016 1155   CALCIUM 9.2 10/27/2016 1058   GFRNONAA 88 10/27/2016 1058   GFRNONAA 84 06/04/2016 1155   GFRAA 102 10/27/2016 1058   GFRAA >89 06/04/2016 1155    Lab Results  Component Value Date   HGBA1C 5.6 10/27/2016   HGBA1C 5.0 11/21/2015   Lab Results  Component Value Date   INSULIN 30.1 (H) 10/27/2016   CBC    Component Value Date/Time   WBC 10.9 (H) 10/27/2016 1058   WBC 13.4 (H) 06/04/2016 1155   RBC 4.48 10/27/2016 1058   RBC 4.37 06/04/2016 1155   HGB 12.1 06/04/2016 1155   HCT 37.3 10/27/2016 1058   PLT 290 06/04/2016 1155   MCV 83 10/27/2016 1058   MCH 27.9 10/27/2016 1058   MCH 27.7 06/04/2016 1155   MCHC 33.5 10/27/2016 1058   MCHC 32.4 06/04/2016 1155   RDW 15.4 10/27/2016 1058   LYMPHSABS 3.4 (H) 10/27/2016 1058   MONOABS 1.3 (H) 09/21/2013 1635   EOSABS 0.1 10/27/2016 1058   BASOSABS 0.0 10/27/2016 1058   Iron/TIBC/Ferritin/ %Sat No results found for: IRON, TIBC, FERRITIN, IRONPCTSAT Lipid Panel     Component Value Date/Time   CHOL 152 10/27/2016 1058   TRIG 57 10/27/2016 1058   HDL 43 10/27/2016 1058   CHOLHDL 3.6 11/21/2015 1047   VLDL 10 11/21/2015 1047   LDLCALC 98 10/27/2016 1058   Hepatic Function Panel     Component Value Date/Time   PROT 7.1 10/27/2016 1058   ALBUMIN 3.9 10/27/2016 1058   AST 16 10/27/2016 1058   ALT 15 10/27/2016 1058   ALKPHOS 73 10/27/2016 1058   BILITOT 0.4 10/27/2016  1058      Component Value Date/Time   TSH 1.460 10/27/2016 1058   TSH 0.96 11/21/2015 1047    ASSESSMENT AND PLAN: Vitamin D deficiency - Plan: Vitamin D, Ergocalciferol, (DRISDOL) 50000 units CAPS capsule  Elevated blood-pressure reading without diagnosis of hypertension  At risk for heart disease  Morbid obesity (HCC)  PLAN:  Vitamin D Deficiency Catherine [name] was informed that low vitamin D levels contributes to fatigue and are associated with obesity, breast, and colon cancer. She agrees to continue to take prescription Vit D @50 ,000 IU every week #4 with no refills and we will re-check labs in 2 months and will follow up for routine testing of vitamin D, at least 2-3 times per year. She was  informed of the risk of over-replacement of vitamin D and agrees to not increase her dose unless he discusses this with [name] first.  Elevated Blood pressure Without Hypertension We will re-check blood pressure in 2 weeks and follow. She will continue to work on diet and exercise, but if no improvement is seen we may need to start her on antihypertensives next visit.  Cardiovascular risk counselling Catherine [name] was given extended (at least 15 minutes) coronary artery disease prevention counseling today. She is 36 y.o. female and has risk factors for heart disease including obesity. We discussed intensive lifestyle modifications today with an emphasis on specific weight loss instructions and strategies. Pt was also informed of the importance of increasing exercise and decreasing saturated fats to help prevent heart disease.  Obesity Catherine [name] is currently in the action stage of change. As such, her goal is to continue with weight loss efforts She has agreed to change to keeping a food journal with 1400 to 1600 calories and 100 grams of protein daily and follow the Category 3 plan Catherine [name] has been instructed to work up to a goal of 150 minutes of combined cardio and strengthening exercise per week for weight loss and overall health benefits. We discussed the following Behavioral Modification Stratagies today: increasing lean protein intake and work on meal planning and easy cooking plans  Catherine [name] has agreed to follow up with our clinic in 2 weeks. She was informed of the importance of frequent follow up visits to maximize her success with intensive lifestyle modifications for her multiple health conditions.  I, [place] Crane, am acting as scribe for Quillian Quince, MD  I have reviewed the above documentation for accuracy and completeness, and I agree with the above. -Quillian Quince, MD

## 2016-11-28 MED FILL — ALL DAY ALLERGY 10 MG TAB: 10 | 90 days supply | Qty: 90 | Fill #0

## 2016-12-09 MED FILL — VIT D2 1.25 MG (50,000 UNIT: 1.25 MG | 28 days supply | Qty: 4 | Fill #0

## 2016-12-12 ENCOUNTER — Other Ambulatory Visit: Payer: Self-pay | Admitting: Internal Medicine

## 2016-12-15 ENCOUNTER — Telehealth: Payer: Self-pay | Admitting: Internal Medicine

## 2016-12-15 ENCOUNTER — Ambulatory Visit (INDEPENDENT_AMBULATORY_CARE_PROVIDER_SITE_OTHER): Payer: 59 | Admitting: Family Medicine

## 2016-12-15 VITALS — BP 114/78 | HR 93 | Temp 98.6°F | Ht 66.0 in | Wt >= 6400 oz

## 2016-12-15 DIAGNOSIS — Z9189 Other specified personal risk factors, not elsewhere classified: Secondary | ICD-10-CM | POA: Diagnosis not present

## 2016-12-15 DIAGNOSIS — R7303 Prediabetes: Secondary | ICD-10-CM

## 2016-12-15 DIAGNOSIS — F3289 Other specified depressive episodes: Secondary | ICD-10-CM

## 2016-12-15 MED ORDER — BUPROPION HCL ER (SR) 150 MG PO TB12: 150.0000 mg | ORAL_TABLET | Freq: Every day | ORAL | 0 refills | Status: DC

## 2016-12-15 MED FILL — raNITIdine HCL 150 MG TABS: 150 | 30 days supply | Qty: 60 | Fill #0 | Status: TO

## 2016-12-15 MED FILL — BUPROPION SR 150 MG TABLET: 150 | 30 days supply | Qty: 30 | Fill #0

## 2016-12-15 NOTE — Telephone Encounter (Signed)
Pt is calling because she needs a new full face mask and the filter for her CPAP machine sent over to [facility]. Please call patient once this is done so that she can go pick them up. jw

## 2016-12-15 NOTE — Progress Notes (Signed)
Office: [phone]  /  Fax: [phone]   HPI:   Chief Complaint: OBESITY Catherine [name] is here to discuss her progress with her obesity treatment plan. She is following Category 3 eating plan (1400 to 1600 calories) 75 mg sugar, 100 grams of protein approximately 100 % of the time and states she is exercising 0 minutes 0 times per week. Catherine [name] did well maintaining weight but struggled to journal. She is getting tired of eating healthy.  Her weight is (!) 449 lb (203.7 kg) today and has maintained weight over the past 2 to 3 weeks since her last visit. She has lost 19 lbs since starting treatment with [name].  Other Depression with emotional eating behaviors Catherine [name] is tearful in office today. She discussed her struggles with emotional eating and not enjoying her food like she used to. She has increased frustration, depressed mood and increased emotional eating. She shows no sign of suicidal or homicidal ideations.  Pre-Diabetes Catherine [name] has a diagnosis of prediabetes based on her elevated Hgb A1c and was informed this puts her at greater risk of developing diabetes. She is not taking metformin currently and is struggling to stay on track with diet and exercise to decrease risk of diabetes. She denies nausea or hypoglycemia.  At risk for diabetes Catherine [name] is at higher than average risk for developing diabetes due to her obesity. She currently denies polyuria or polydipsia.   Depression screen [facility] 2/9 11/21/2016 10/27/2016 09/08/2016 04/17/2016 11/21/2015  Decreased Interest 0 3 0 0 0  Down, Depressed, Hopeless 0 3 0 0 0  PHQ - 2 Score 0 6 0 0 0  Altered sleeping - 2 - - -  Tired, decreased energy - 2 - - -  Change in appetite - 3 - - -  Feeling bad or failure about yourself  - 2 - - -  Trouble concentrating - 3 - - -  Moving slowly or fidgety/restless - 3 - - -  Suicidal thoughts - 2 - - -  PHQ-9 Score - 23 - - -  Some encounter information is confidential and restricted. Go to Review Flowsheets  activity to see all data.     Wt Readings from Last 500 Encounters:  12/15/16 (!) 449 lb (203.7 kg)  11/26/16 (!) 449 lb (203.7 kg)  11/21/16 (!) 460 lb 9.6 oz (208.9 kg)  11/18/16 (!) 458 lb 9.6 oz (208 kg)  11/12/16 (!) 457 lb (207.3 kg)  10/27/16 (!) 468 lb (212.3 kg)  09/26/16 (!) 465 lb 9.6 oz (211.2 kg)  09/26/16 (!) 469 lb (212.7 kg)  09/08/16 (!) 468 lb 12.8 oz (212.6 kg)  08/14/16 (!) 467 lb (211.8 kg)  07/31/16 (!) 464 lb 6.4 oz (210.7 kg)  07/28/16 (!) 463 lb 12.8 oz (210.4 kg)  07/25/16 (!) 471 lb (213.6 kg)  06/04/16 (!) 463 lb 9.6 oz (210.3 kg)  04/17/16 (!) 459 lb (208.2 kg)  03/19/16 (!) 460 lb (208.7 kg)  03/03/16 (!) 464 lb 9.6 oz (210.7 kg)  02/15/16 (!) 461 lb 1.6 oz (209.2 kg)  01/29/16 (!) 450 lb 4.8 oz (204.3 kg)  12/25/15 (!) 457 lb 12.8 oz (207.7 kg)  11/21/15 (!) 444 lb 3.2 oz (201.5 kg)  09/26/15 (!) 427 lb (193.7 kg)  07/09/15 (!) 435 lb (197.3 kg)  06/11/15 (!) 431 lb 12.8 oz (195.9 kg)  05/14/15 (!) 431 lb (195.5 kg)  05/08/15 (!) 427 lb (193.7 kg)  10/04/13 (!) 409 lb (185.5 kg)  09/21/13 (!) 422 lb (191.4  kg)  09/12/13 (!) 435 lb 12.8 oz (197.7 kg)  07/29/13 (!) 406 lb (184.2 kg)  06/28/13 (!) 424 lb (192.3 kg)  06/14/13 (!) 417 lb (189.1 kg)  06/01/13 (!) 427 lb (193.7 kg)  05/06/13 (!) 417 lb (189.1 kg)  05/02/13 (!) 432 lb 3.2 oz (196 kg)  04/27/13 (!) 427 lb 12.8 oz (194 kg)     ALLERGIES: Allergies  Allergen Reactions  . Citrus Swelling    MEDICATIONS: Current Outpatient Prescriptions on File Prior to Visit  Medication Sig Dispense Refill  . allopurinol (ZYLOPRIM) 100 MG tablet TAKE 1 TABLET BY MOUTH DAILY. 30 tablet 2  . aspirin-acetaminophen-caffeine (EXCEDRIN MIGRAINE) 250-250-65 MG per tablet Take 1 tablet by mouth 2 (two) times daily as needed for migraine. For headache    . benzonatate (TESSALON) 100 MG capsule Take 1 capsule (100 mg total) by mouth 2 (two) times daily as needed for cough. 20 capsule 0  . budesonide  (PULMICORT) 180 MCG/ACT inhaler Inhale 1 puff into the lungs 2 (two) times daily. 1 Inhaler 1  . cetirizine (ZYRTEC) 10 MG tablet Take 1 tablet (10 mg total) by mouth daily. 30 tablet 2  . furosemide (LASIX) 20 MG tablet Take 1 tablet (20 mg total) by mouth daily. 30 tablet 3  . ibuprofen (ADVIL,MOTRIN) 200 MG tablet Take 200 mg by mouth every 6 (six) hours as needed for moderate pain.    Marland Kitchen ondansetron (ZOFRAN ODT) 8 MG disintegrating tablet Take 1 tablet (8 mg total) by mouth every 8 (eight) hours as needed for nausea or vomiting. 20 tablet 0  . potassium chloride (K-DUR) 10 MEQ tablet Take 1 tablet (10 mEq total) by mouth daily. 30 tablet 5  . ranitidine (ZANTAC) 150 MG tablet TAKE 1 TABLET BY MOUTH 2 TIMES DAILY. 60 tablet 0  . Vitamin D, Ergocalciferol, (DRISDOL) 50000 units CAPS capsule Take 1 capsule (50,000 Units total) by mouth every 7 (seven) days. 4 capsule 0   No current facility-administered medications on file prior to visit.     PAST MEDICAL HISTORY: Past Medical History:  Diagnosis Date  . Allergy   . Anxiety   . Arthritis   . Asthma    since baby  . ASTHMA, UNSPECIFIED 12/10/2006   Qualifier: Diagnosis of  By: Abundio Miu    . CELLULITIS AND ABSCESS OF LEG EXCEPT FOOT 12/30/2007   Qualifier: Diagnosis of  By: Daphine Deutscher FNP, Zena Amos    . DEPENDENT EDEMA, LEGS, BILATERAL 11/22/2008   Qualifier: Diagnosis of  By: Barbaraann Barthel MD, [place]    . Depression 2003Dx  . Edema   . Food allergy    citris  . FOOT PAIN, RIGHT 11/22/2008   Qualifier: Diagnosis of  By: Barbaraann Barthel MD, [place]    . Gastric ulcer   . GERD (gastroesophageal reflux disease)   . Gout   . Hypertension   . Joint pain   . KNEE INJURY, LEFT 01/01/2009   Qualifier: Diagnosis of  By: Delrae Alfred MD, Lanora Manis    . Migraine   . ONYCHOMYCOSIS, TOENAILS 11/22/2008   Qualifier: Diagnosis of  By: Barbaraann Barthel MD, [place]    . Shortness of breath   . Sleep apnea   . TINEA PEDIS 11/22/2008   Qualifier: Diagnosis of  By:  Barbaraann Barthel MD, Benetta Spar      PAST SURGICAL HISTORY: Past Surgical History:  Procedure Laterality Date  . WISDOM TOOTH EXTRACTION  2008   x4    SOCIAL HISTORY: Social History  Substance Use Topics  .  Smoking status: Former Smoker    Packs/day: 0.50    Years: 18.00    Types: Cigarettes    Start date: 10/13/1996    Quit date: 01/12/2015  . Smokeless tobacco: Never Used  . Alcohol use 0.6 oz/week    1 Standard drinks or equivalent per week     Comment: rarely drinks    FAMILY HISTORY: Family History  Problem Relation Age of Onset  . Hypertension Mother   . Hyperlipidemia Mother   . Cancer Mother     bone cancer  . Heart disease Mother   . Sudden death Mother   . Stroke Mother   . Thyroid disease Mother   . Sleep apnea Mother   . Obesity Mother   . Sudden death Father   . Hypertension Father   . Heart disease Father   . Hyperlipidemia Father   . Prostate cancer Father   . Lung cancer Father   . Obesity Father   . Alcoholism Father   . Sudden death Brother   . Hypertension Brother   . Hyperlipidemia Brother   . Heart attack Brother   . Heart disease Brother   . Stroke Brother   . Alcoholism Brother   . Drug abuse Brother   . Heart disease Sister   . Hypertension Sister   . Thyroid cancer Sister   . Diabetes Neg Hx   . Colon cancer Neg Hx   . Stomach cancer Neg Hx   . Esophageal cancer Neg Hx   . Rectal cancer Neg Hx   . Liver cancer Neg Hx     ROS: Review of Systems  Constitutional: Negative for weight loss.  Gastrointestinal: Negative for nausea.  Genitourinary: Negative for frequency.  Endo/Heme/Allergies: Negative for polydipsia.       Negative Hypoglycemia  Psychiatric/Behavioral: Positive for depression.    PHYSICAL EXAM: Blood pressure 114/78, pulse 93, temperature 98.6 F (37 C), temperature source Oral, height 5\' 6"  (1.676 m), weight (!) 449 lb (203.7 kg), last menstrual period 12/06/2016, SpO2 95 %. Body mass index is 72.47 kg/m. Physical  Exam  Constitutional: She is oriented to person, place, and time. She appears well-developed and well-nourished.  Cardiovascular: Normal rate.   Pulmonary/Chest: Effort normal.  Musculoskeletal: Normal range of motion.  Neurological: She is oriented to person, place, and time.  Skin: Skin is warm and dry.  Vitals reviewed.   RECENT LABS AND TESTS: BMET    Component Value Date/Time   NA 140 10/27/2016 1058   K 4.4 10/27/2016 1058   CL 101 10/27/2016 1058   CO2 23 10/27/2016 1058   GLUCOSE 84 10/27/2016 1058   GLUCOSE 83 06/04/2016 1155   BUN 11 10/27/2016 1058   CREATININE 0.86 10/27/2016 1058   CREATININE 0.90 06/04/2016 1155   CALCIUM 9.2 10/27/2016 1058   GFRNONAA 88 10/27/2016 1058   GFRNONAA 84 06/04/2016 1155   GFRAA 102 10/27/2016 1058   GFRAA >89 06/04/2016 1155   Lab Results  Component Value Date   HGBA1C 5.6 10/27/2016   HGBA1C 5.0 11/21/2015   Lab Results  Component Value Date   INSULIN 30.1 (H) 10/27/2016   CBC    Component Value Date/Time   WBC 10.9 (H) 10/27/2016 1058   WBC 13.4 (H) 06/04/2016 1155   RBC 4.48 10/27/2016 1058   RBC 4.37 06/04/2016 1155   HGB 12.1 06/04/2016 1155   HCT 37.3 10/27/2016 1058   PLT 290 06/04/2016 1155   MCV 83 10/27/2016 1058   MCH  27.9 10/27/2016 1058   MCH 27.7 06/04/2016 1155   MCHC 33.5 10/27/2016 1058   MCHC 32.4 06/04/2016 1155   RDW 15.4 10/27/2016 1058   LYMPHSABS 3.4 (H) 10/27/2016 1058   MONOABS 1.3 (H) 09/21/2013 1635   EOSABS 0.1 10/27/2016 1058   BASOSABS 0.0 10/27/2016 1058   Iron/TIBC/Ferritin/ %Sat No results found for: IRON, TIBC, FERRITIN, IRONPCTSAT Lipid Panel     Component Value Date/Time   CHOL 152 10/27/2016 1058   TRIG 57 10/27/2016 1058   HDL 43 10/27/2016 1058   CHOLHDL 3.6 11/21/2015 1047   VLDL 10 11/21/2015 1047   LDLCALC 98 10/27/2016 1058   Hepatic Function Panel     Component Value Date/Time   PROT 7.1 10/27/2016 1058   ALBUMIN 3.9 10/27/2016 1058   AST 16 10/27/2016  1058   ALT 15 10/27/2016 1058   ALKPHOS 73 10/27/2016 1058   BILITOT 0.4 10/27/2016 1058      Component Value Date/Time   TSH 1.460 10/27/2016 1058   TSH 0.96 11/21/2015 1047    ASSESSMENT AND PLAN: Prediabetes - Plan: buPROPion (WELLBUTRIN SR) 150 MG 12 hr tablet  Other depression  At risk for diabetes mellitus  Morbid obesity (HCC)  PLAN:  Other Depression with emotional eating Noelle agrees to start Wellbutrin SR 150 mg every morning #30 with no refills and will follow up in our office in 2 weeks.  Pre-Diabetes Catherine [name] will continue to work on weight loss, exercise, and decreasing simple carbohydrates in her diet to help decrease the risk of diabetes. We dicussed metformin including benefits and risks. She was informed that eating too many simple carbohydrates or too many calories at one sitting increases the likelihood of GI side effects. We will re-check labs in 6 weeks and Kilie agreed to follow up with us as directed to monitor her progress.  Diabetes risk counselling Catherine [name] was given extended (at least 15 minutes) diabetes prevention counseling today. She is 36 y.o. female and has risk factors for diabetes including obesity. We discussed intensive lifestyle modifications today with an emphasis on weight loss as well as increasing exercise and decreasing simple carbohydrates in her diet.  Obesity Catherine [name] is currently in the action stage of change. As such, her goal is to continue with weight loss efforts She has agreed to keep a food journal with 400 to 600 calories and 35 grams of protein at supper daily and follow the Category 3 plan Catherine [name] has been instructed to work up to a goal of 150 minutes of combined cardio and strengthening exercise per week for weight loss and overall health benefits. We discussed the following Behavioral Modification Stratagies today: increasing lean protein intake, work on meal planning and easy cooking plans and emotional eating  strategies  Catherine [name] has agreed to follow up with our clinic in 2 weeks. She was informed of the importance of frequent follow up visits to maximize her success with intensive lifestyle modifications for her multiple health conditions.  I, [place] CraneJoanne Murray, am acting as scribe for Quillian Quincearen Evona Westra, MD  I have reviewed the above documentation for accuracy and completeness, and I agree with the above. -Quillian Quincearen Lamyra Malcolm, MD

## 2016-12-16 ENCOUNTER — Ambulatory Visit: Payer: 59 | Admitting: Family Medicine

## 2016-12-16 NOTE — Telephone Encounter (Signed)
Spoke with Raven at [facility]. States Rx for "CPAP supplies" along with chart notes that state patient is in compliance with CPAP and has shown benefit from CPAP needs to be faxed to [facility] at [phone]. When patient comes in to store they will provide her with the exact items she needs. Kinnie FeilL. Murray Guzzetta, RN, BSN

## 2016-12-17 NOTE — Telephone Encounter (Signed)
Patient scheduled appt with PCP for 01/14/2017 to discuss compliance and benefit of CPAP. Kinnie FeilL. Jada Kuhnert, RN, BSN

## 2016-12-30 DIAGNOSIS — M5489 Other dorsalgia: Secondary | ICD-10-CM | POA: Diagnosis not present

## 2016-12-30 MED FILL — NAPROXEN 500 MG TABLET: 500 | 10 days supply | Qty: 20 | Fill #0

## 2017-01-01 ENCOUNTER — Ambulatory Visit (INDEPENDENT_AMBULATORY_CARE_PROVIDER_SITE_OTHER): Payer: 59 | Admitting: Family Medicine

## 2017-01-01 ENCOUNTER — Encounter (INDEPENDENT_AMBULATORY_CARE_PROVIDER_SITE_OTHER): Payer: Self-pay | Admitting: Family Medicine

## 2017-01-01 VITALS — BP 103/68 | HR 84 | Temp 97.7°F | Ht 66.0 in | Wt >= 6400 oz

## 2017-01-01 DIAGNOSIS — E559 Vitamin D deficiency, unspecified: Secondary | ICD-10-CM

## 2017-01-01 DIAGNOSIS — F3289 Other specified depressive episodes: Secondary | ICD-10-CM

## 2017-01-01 MED ORDER — VITAMIN D (ERGOCALCIFEROL) 1.25 MG (50000 UNIT) PO CAPS: 50000.0000 [IU] | ORAL_CAPSULE | ORAL | 0 refills | Status: DC

## 2017-01-01 MED FILL — VIT D2 1.25 MG (50,000 UNIT: 1.25 MG | 28 days supply | Qty: 4 | Fill #0

## 2017-01-01 NOTE — Progress Notes (Signed)
Office: [phone]  /  Fax: [phone]   HPI:   Chief Complaint: OBESITY Catherine [name] is here to discuss her progress with her obesity treatment plan. She is following her eating plan approximately 100 % of the time and states she is exercising 0 minutes 0 times per week. Catherine [name] states she is following the plan precisely but described multiple examples of eating foods that sre not on her plan. She is doing better at portion control/smart choices. Her weight is (!) 449 lb (203.7 kg) today and has maintained weight over a period of 2 weeks since her last visit. She has lost 19 lbs since starting treatment with us.  Vitamin D deficiency Catherine [name] has a diagnosis of vitamin D deficiency. She is currently stable on vit D, not yet at goal. She still notes fatigue and denies nausea, vomiting or muscle weakness.  Depression with emotional eating behaviors Catherine [name] is struggling with emotional eating and using food for comfort to the extent that it is negatively impacting her health. She often snacks when she is not hungry. Catherine [name] sometimes feels she is out of control and then feels guilty that she made poor food choices. She has been working on behavior modification techniques to help reduce her emotional eating and has been somewhat successful. She is on Wellbutrin and thinks she is doing better with making better food choices and decreased emotional eating. She shows no sign of suicidal or homicidal ideations. She denies insomnia. Blood pressure is not elevated.  [facility] 11/21/2016 10/27/2016 09/08/2016 04/17/2016 11/21/2015  Decreased Interest 0 3 0 0 0  Down, Depressed, Hopeless 0 3 0 0 0  PHQ - 2 Score 0 6 0 0 0  Altered sleeping - 2 - - -  Tired, decreased energy - 2 - - -  Change in appetite - 3 - - -  Feeling bad or failure about yourself  - 2 - - -  Trouble concentrating - 3 - - -  Moving slowly or fidgety/restless - 3 - - -  Suicidal thoughts - 2 - - -  PHQ-9 Score - 23 - -  -  Some encounter information is confidential and restricted. Go to Review Flowsheets activity to see all data.     Wt Readings from Last 500 Encounters:  01/01/17 (!) 449 lb (203.7 kg)  12/15/16 (!) 449 lb (203.7 kg)  11/26/16 (!) 449 lb (203.7 kg)  11/21/16 (!) 460 lb 9.6 oz (208.9 kg)  11/18/16 (!) 458 lb 9.6 oz (208 kg)  11/12/16 (!) 457 lb (207.3 kg)  10/27/16 (!) 468 lb (212.3 kg)  09/26/16 (!) 465 lb 9.6 oz (211.2 kg)  09/26/16 (!) 469 lb (212.7 kg)  09/08/16 (!) 468 lb 12.8 oz (212.6 kg)  08/14/16 (!) 467 lb (211.8 kg)  07/31/16 (!) 464 lb 6.4 oz (210.7 kg)  07/28/16 (!) 463 lb 12.8 oz (210.4 kg)  07/25/16 (!) 471 lb (213.6 kg)  06/04/16 (!) 463 lb 9.6 oz (210.3 kg)  04/17/16 (!) 459 lb (208.2 kg)  03/19/16 (!) 460 lb (208.7 kg)  03/03/16 (!) 464 lb 9.6 oz (210.7 kg)  02/15/16 (!) 461 lb 1.6 oz (209.2 kg)  01/29/16 (!) 450 lb 4.8 oz (204.3 kg)  12/25/15 (!) 457 lb 12.8 oz (207.7 kg)  11/21/15 (!) 444 lb 3.2 oz (201.5 kg)  09/26/15 (!) 427 lb (193.7 kg)  07/09/15 (!) 435 lb (197.3 kg)  06/11/15 (!) 431 lb 12.8 oz (195.9 kg)  05/14/15 (!) 431 lb (195.5  kg)  05/08/15 (!) 427 lb (193.7 kg)  10/04/13 (!) 409 lb (185.5 kg)  09/21/13 (!) 422 lb (191.4 kg)  09/12/13 (!) 435 lb 12.8 oz (197.7 kg)  07/29/13 (!) 406 lb (184.2 kg)  06/28/13 (!) 424 lb (192.3 kg)  06/14/13 (!) 417 lb (189.1 kg)  06/01/13 (!) 427 lb (193.7 kg)  05/06/13 (!) 417 lb (189.1 kg)  05/02/13 (!) 432 lb 3.2 oz (196 kg)  04/27/13 (!) 427 lb 12.8 oz (194 kg)     ALLERGIES: Allergies  Allergen Reactions  . Citrus Swelling    MEDICATIONS: Current Outpatient Prescriptions on File Prior to Visit  Medication Sig Dispense Refill  . allopurinol (ZYLOPRIM) 100 MG tablet TAKE 1 TABLET BY MOUTH DAILY. 30 tablet 2  . aspirin-acetaminophen-caffeine (EXCEDRIN MIGRAINE) 250-250-65 MG per tablet Take 1 tablet by mouth 2 (two) times daily as needed for migraine. For headache    . benzonatate (TESSALON) 100  MG capsule Take 1 capsule (100 mg total) by mouth 2 (two) times daily as needed for cough. 20 capsule 0  . budesonide (PULMICORT) 180 MCG/ACT inhaler Inhale 1 puff into the lungs 2 (two) times daily. 1 Inhaler 1  . buPROPion (WELLBUTRIN SR) 150 MG 12 hr tablet Take 1 tablet (150 mg total) by mouth daily. 30 tablet 0  . cetirizine (ZYRTEC) 10 MG tablet Take 1 tablet (10 mg total) by mouth daily. 30 tablet 2  . furosemide (LASIX) 20 MG tablet Take 1 tablet (20 mg total) by mouth daily. 30 tablet 3  . ibuprofen (ADVIL,MOTRIN) 200 MG tablet Take 200 mg by mouth every 6 (six) hours as needed for moderate pain.    Marland Kitchen ondansetron (ZOFRAN ODT) 8 MG disintegrating tablet Take 1 tablet (8 mg total) by mouth every 8 (eight) hours as needed for nausea or vomiting. 20 tablet 0  . potassium chloride (K-DUR) 10 MEQ tablet Take 1 tablet (10 mEq total) by mouth daily. 30 tablet 5  . ranitidine (ZANTAC) 150 MG tablet TAKE 1 TABLET BY MOUTH 2 TIMES DAILY. 60 tablet 0   No current facility-administered medications on file prior to visit.     PAST MEDICAL HISTORY: Past Medical History:  Diagnosis Date  . Allergy   . Anxiety   . Arthritis   . Asthma    since baby  . ASTHMA, UNSPECIFIED 12/10/2006   Qualifier: Diagnosis of  By: Abundio Miu    . CELLULITIS AND ABSCESS OF LEG EXCEPT FOOT 12/30/2007   Qualifier: Diagnosis of  By: Daphine Deutscher FNP, Zena Amos    . DEPENDENT EDEMA, LEGS, BILATERAL 11/22/2008   Qualifier: Diagnosis of  By: Barbaraann Barthel MD, [place]    . Depression 2003Dx  . Edema   . Food allergy    citris  . FOOT PAIN, RIGHT 11/22/2008   Qualifier: Diagnosis of  By: Barbaraann Barthel MD, [place]    . Gastric ulcer   . GERD (gastroesophageal reflux disease)   . Gout   . Hypertension   . Joint pain   . KNEE INJURY, LEFT 01/01/2009   Qualifier: Diagnosis of  By: Delrae Alfred MD, Lanora Manis    . Migraine   . ONYCHOMYCOSIS, TOENAILS 11/22/2008   Qualifier: Diagnosis of  By: Barbaraann Barthel MD, [place]    . Shortness of  breath   . Sleep apnea   . TINEA PEDIS 11/22/2008   Qualifier: Diagnosis of  By: Barbaraann Barthel MD, Benetta Spar      PAST SURGICAL HISTORY: Past Surgical History:  Procedure Laterality Date  . WISDOM TOOTH EXTRACTION  2008   x4    SOCIAL HISTORY: Social History  Substance Use Topics  . Smoking status: Former Smoker    Packs/day: 0.50    Years: 18.00    Types: Cigarettes    Start date: 10/13/1996    Quit date: 01/12/2015  . Smokeless tobacco: Never Used  . Alcohol use 0.6 oz/week    1 Standard drinks or equivalent per week     Comment: rarely drinks    FAMILY HISTORY: Family History  Problem Relation Age of Onset  . Hypertension Mother   . Hyperlipidemia Mother   . Cancer Mother     bone cancer  . Heart disease Mother   . Sudden death Mother   . Stroke Mother   . Thyroid disease Mother   . Sleep apnea Mother   . Obesity Mother   . Sudden death Father   . Hypertension Father   . Heart disease Father   . Hyperlipidemia Father   . Prostate cancer Father   . Lung cancer Father   . Obesity Father   . Alcoholism Father   . Sudden death Brother   . Hypertension Brother   . Hyperlipidemia Brother   . Heart attack Brother   . Heart disease Brother   . Stroke Brother   . Alcoholism Brother   . Drug abuse Brother   . Heart disease Sister   . Hypertension Sister   . Thyroid cancer Sister   . Diabetes Neg Hx   . Colon cancer Neg Hx   . Stomach cancer Neg Hx   . Esophageal cancer Neg Hx   . Rectal cancer Neg Hx   . Liver cancer Neg Hx     ROS: Review of Systems  Constitutional: Positive for malaise/fatigue.  Gastrointestinal: Negative for nausea and vomiting.  Musculoskeletal:       Negative muscle weakness  Psychiatric/Behavioral: Negative for suicidal ideas. The patient does not have insomnia.     PHYSICAL EXAM: Blood pressure 103/68, pulse 84, temperature 97.7 F (36.5 C), temperature source Oral, height 5\' 6"  (1.676 m), weight (!) 449 lb (203.7 kg), last menstrual  period 12/06/2016, SpO2 100 %. Body mass index is 72.47 kg/m. Physical Exam  Constitutional: She is oriented to person, place, and time. She appears well-developed and well-nourished.  Cardiovascular: Normal rate.   Pulmonary/Chest: Effort normal.  Musculoskeletal: Normal range of motion.  Neurological: She is oriented to person, place, and time.  Skin: Skin is warm and dry.  Vitals reviewed.   RECENT LABS AND TESTS: BMET    Component Value Date/Time   NA 140 10/27/2016 1058   K 4.4 10/27/2016 1058   CL 101 10/27/2016 1058   CO2 23 10/27/2016 1058   GLUCOSE 84 10/27/2016 1058   GLUCOSE 83 06/04/2016 1155   BUN 11 10/27/2016 1058   CREATININE 0.86 10/27/2016 1058   CREATININE 0.90 06/04/2016 1155   CALCIUM 9.2 10/27/2016 1058   GFRNONAA 88 10/27/2016 1058   GFRNONAA 84 06/04/2016 1155   GFRAA 102 10/27/2016 1058   GFRAA >89 06/04/2016 1155   Lab Results  Component Value Date   HGBA1C 5.6 10/27/2016   HGBA1C 5.0 11/21/2015   Lab Results  Component Value Date   INSULIN 30.1 (H) 10/27/2016   CBC    Component Value Date/Time   WBC 10.9 (H) 10/27/2016 1058   WBC 13.4 (H) 06/04/2016 1155   RBC 4.48 10/27/2016 1058   RBC 4.37 06/04/2016 1155   HGB 12.1 06/04/2016 1155  HCT 37.3 10/27/2016 1058   PLT 290 06/04/2016 1155   MCV 83 10/27/2016 1058   MCH 27.9 10/27/2016 1058   MCH 27.7 06/04/2016 1155   MCHC 33.5 10/27/2016 1058   MCHC 32.4 06/04/2016 1155   RDW 15.4 10/27/2016 1058   LYMPHSABS 3.4 (H) 10/27/2016 1058   MONOABS 1.3 (H) 09/21/2013 1635   EOSABS 0.1 10/27/2016 1058   BASOSABS 0.0 10/27/2016 1058   Iron/TIBC/Ferritin/ %Sat No results found for: IRON, TIBC, FERRITIN, IRONPCTSAT Lipid Panel     Component Value Date/Time   CHOL 152 10/27/2016 1058   TRIG 57 10/27/2016 1058   HDL 43 10/27/2016 1058   CHOLHDL 3.6 11/21/2015 1047   VLDL 10 11/21/2015 1047   LDLCALC 98 10/27/2016 1058   Hepatic Function Panel     Component Value Date/Time   PROT  7.1 10/27/2016 1058   ALBUMIN 3.9 10/27/2016 1058   AST 16 10/27/2016 1058   ALT 15 10/27/2016 1058   ALKPHOS 73 10/27/2016 1058   BILITOT 0.4 10/27/2016 1058      Component Value Date/Time   TSH 1.460 10/27/2016 1058   TSH 0.96 11/21/2015 1047    ASSESSMENT AND PLAN: Other depression  Vitamin D deficiency - Plan: Vitamin D, Ergocalciferol, (DRISDOL) 50000 units CAPS capsule  Morbid obesity (HCC)  PLAN:  Vitamin D Deficiency Jadalee was informed that low vitamin D levels contributes to fatigue and are associated with obesity, breast, and colon cancer. She agrees to continue to take prescription Vit D @50 ,000 IU every week, we will refill for 1 month and will follow up for routine testing of vitamin D, at least 2-3 times per year. She was informed of the risk of over-replacement of vitamin D and agrees to not increase her dose unless he discusses this with [name] first. Litzi agrees to follow up with our clinic in 2 weeks.  Depression with Emotional Eating Behaviors We discussed behavior modification techniques today to help Fleur deal with her emotional eating and depression. She has agreed to continue to take Wellbutrin SR 150 mg qd and agreed to follow up as directed.  Obesity Manasi is currently in the action stage of change. As such, her goal is to continue with weight loss efforts She has agreed to follow the Category 3 plan [place] has been instructed to work up to a goal of 150 minutes of combined cardio and strengthening exercise per week for weight loss and overall health benefits. We discussed the following Behavioral Modification Stratagies today: increasing lean protein intake, decreasing simple carbohydrates , increasing lower sugar fruits and work on meal planning and easy cooking plans  Dejae has agreed to follow up with our clinic in 2 weeks. She was informed of the importance of frequent follow up visits to maximize her success with intensive lifestyle  modifications for her multiple health conditions.  I, [place] Crane, am acting as scribe for Quillian Quince, MD  I have reviewed the above documentation for accuracy and completeness, and I agree with the above. -Quillian Quince, MD

## 2017-01-07 ENCOUNTER — Encounter: Payer: Self-pay | Admitting: Pulmonary Disease

## 2017-01-07 ENCOUNTER — Ambulatory Visit (INDEPENDENT_AMBULATORY_CARE_PROVIDER_SITE_OTHER): Payer: 59 | Admitting: Pulmonary Disease

## 2017-01-07 VITALS — BP 126/74 | HR 88 | Ht 66.0 in | Wt >= 6400 oz

## 2017-01-07 DIAGNOSIS — G4733 Obstructive sleep apnea (adult) (pediatric): Secondary | ICD-10-CM

## 2017-01-07 NOTE — Progress Notes (Signed)
Catherine [name]    [id number]    1981-07-26  Primary Care Physician:Catherine Marlis EdelsonL Wallace, DO  Referring Physician: [name], DO [address] [place], [place] [id number]  Chief complaint:  Consult for management of sleep apnea  HPI: 36 year old with morbid obesity, asthma diagnosed with sleep apnea in 2014. She has been maintained on CPAP of 9 with good results. She recently moved and lost her CPAP supplies and has not been using therapy for the past 6 months. She is noticed increase in her daytime somnolence, worsening of her sleep quality.  She is an ex-smoker who quit in 2016. She works as a Nutritional therapistswitchboard operator at [employer] with no known exposures. She has history of asthma, seasonal allergies, GERD and was previously maintained on albuterol. She was started on Qvar last year due to increased use of albuterol and has been stable on this regimen.  Outpatient Encounter Prescriptions as of 01/07/2017  Medication Sig  . allopurinol (ZYLOPRIM) 100 MG tablet TAKE 1 TABLET BY MOUTH DAILY.  Marland Kitchen. aspirin-acetaminophen-caffeine (EXCEDRIN MIGRAINE) 250-250-65 MG per tablet Take 1 tablet by mouth 2 (two) times daily as needed for migraine. For headache  . benzonatate (TESSALON) 100 MG capsule Take 1 capsule (100 mg total) by mouth 2 (two) times daily as needed for cough.  . budesonide (PULMICORT) 180 MCG/ACT inhaler Inhale 1 puff into the lungs 2 (two) times daily.  Marland Kitchen. buPROPion (WELLBUTRIN SR) 150 MG 12 hr tablet Take 1 tablet (150 mg total) by mouth daily.  . cetirizine (ZYRTEC) 10 MG tablet Take 1 tablet (10 mg total) by mouth daily.  . furosemide (LASIX) 20 MG tablet Take 1 tablet (20 mg total) by mouth daily.  Marland Kitchen. ibuprofen (ADVIL,MOTRIN) 200 MG tablet Take 200 mg by mouth every 6 (six) hours as needed for moderate pain.  Marland Kitchen. ondansetron (ZOFRAN ODT) 8 MG disintegrating tablet Take 1 tablet (8 mg total) by mouth every 8 (eight) hours as needed for nausea or  vomiting.  . potassium chloride (K-DUR) 10 MEQ tablet Take 1 tablet (10 mEq total) by mouth daily.  . ranitidine (ZANTAC) 150 MG tablet TAKE 1 TABLET BY MOUTH 2 TIMES DAILY.  Marland Kitchen. Vitamin D, Ergocalciferol, (DRISDOL) 50000 units CAPS capsule Take 1 capsule (50,000 Units total) by mouth every 7 (seven) days.   No facility-administered encounter medications on file as of 01/07/2017.     Allergies as of 01/07/2017 - Review Complete 01/07/2017  Allergen Reaction Noted  . Citrus Swelling 09/08/2016    Past Medical History:  Diagnosis Date  . Allergy   . Anxiety   . Arthritis   . Asthma    since baby  . ASTHMA, UNSPECIFIED 12/10/2006   Qualifier: Diagnosis of  By: Catherine [name]    . CELLULITIS AND ABSCESS OF LEG EXCEPT FOOT 12/30/2007   Qualifier: Diagnosis of  By: Catherine [name]    . DEPENDENT EDEMA, LEGS, BILATERAL 11/22/2008   Qualifier: Diagnosis of  By: Catherine [name]    . Depression 2003Dx  . Edema   . Food allergy    citris  . FOOT PAIN, RIGHT 11/22/2008   Qualifier: Diagnosis of  By: Catherine [name]    . Gastric ulcer   . GERD (gastroesophageal reflux disease)   . Gout   . Hypertension   . Joint pain   . KNEE INJURY, LEFT 01/01/2009   Qualifier: Diagnosis of  By: Catherine [name]    .  Migraine   . ONYCHOMYCOSIS, TOENAILS 11/22/2008   Qualifier: Diagnosis of  By: Catherine [name]    . Shortness of breath   . Sleep apnea   . TINEA PEDIS 11/22/2008   Qualifier: Diagnosis of  By: Catherine [name]      Past Surgical History:  Procedure Laterality Date  . WISDOM TOOTH EXTRACTION  2008   x4    Family History  Problem Relation Age of Onset  . Hypertension Mother   . Hyperlipidemia Mother   . Cancer Mother     bone cancer  . Heart disease Mother   . Sudden death Mother   . Stroke Mother   . Thyroid disease Mother   . Sleep apnea Mother   . Obesity Mother   . Sudden death Father   . Hypertension Father   . Heart disease Father   .  Hyperlipidemia Father   . Prostate cancer Father   . Lung cancer Father   . Obesity Father   . Alcoholism Father   . Sudden death Brother   . Hypertension Brother   . Hyperlipidemia Brother   . Heart attack Brother   . Heart disease Brother   . Stroke Brother   . Alcoholism Brother   . Drug abuse Brother   . Heart disease Sister   . Hypertension Sister   . Thyroid cancer Sister   . Diabetes Neg Hx   . Colon cancer Neg Hx   . Stomach cancer Neg Hx   . Esophageal cancer Neg Hx   . Rectal cancer Neg Hx   . Liver cancer Neg Hx     Social History   Social History  . Marital status: Married    Spouse name: N/A  . Number of children: 3  . Years of education: college    Occupational History  . 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 Smoking status: Former Smoker    Packs/day: 0.50    Years: 18.00    Types: Cigarettes    Start date: 10/13/1996    Quit date: 01/12/2015  . Smokeless tobacco: Never Used  . Alcohol use 0.6 oz/week    1 Standard drinks or equivalent per week     Comment: rarely drinks  . Drug use: No     Comment: marjuana - former use, quit 4 years ago  . Sexual activity: No   Other Topics Concern  . Not on file   Social History Narrative  . No narrative on file    Review of systems: Review of Systems  Constitutional: Negative for fever and chills.  HENT: Negative.   Eyes: Negative for blurred vision.  Respiratory: as per HPI  Cardiovascular: Negative for chest pain and palpitations.  Gastrointestinal: Negative for vomiting, diarrhea, blood per rectum. Genitourinary: Negative for dysuria, urgency, frequency and hematuria.  Musculoskeletal: Negative for myalgias, back pain and joint pain.  Skin: Negative for itching and rash.  Neurological: Negative for dizziness, tremors, focal weakness, seizures and loss of consciousness.  Endo/Heme/Allergies: Negative for environmental allergies.  Psychiatric/Behavioral:  Negative for depression, suicidal ideas and hallucinations.  All other systems reviewed and are negative.  Physical Exam: Blood pressure 126/74, pulse 88, height 5\' 6"  (1.676 m), weight (!) 453 lb 6.4 oz (205.7 kg), SpO2 97 %. Gen:      No acute distress HEENT:  EOMI, sclera anicteric Neck:     No masses; no thyromegaly Lungs:  Clear to auscultation bilaterally; normal respiratory effort CV:         Regular rate and rhythm; no murmurs Abd:      + bowel sounds; soft, non-tender; no palpable masses, no distension Ext:    No edema; adequate peripheral perfusion Skin:      Warm and dry; no rash Neuro: alert and oriented x 3 Psych: normal mood and affect  Data Reviewed: Sleep study 06/05/13 1. No potential for sleep disturbance because the patient normally     works 3rd shift. 2. Moderate obstructive sleep apnea/hypopnea syndrome, AHI of 20.6 per     hour with events more common while supine.  Loud snoring with     oxygen desaturation to a nadir of 84% on room air. 3. Successful CPAP titration to 9 CWP, AHI 0 per hour.  She wore a     medium ResMed Mirage Quattro full-face     mask with heated humidifier.  Snoring was prevented and mean oxygen     saturation held 97.1% on room air with CPAP.  CXR 05/08/15-mild interstitial prominence. I have reviewed the images personally.  Epworth sleepiness scale 01/08/27-19  Assessment:  Moderate sleep apnea She has been off CPAP for 6 months with worsening symptoms of sleep apnea. We will try to get her back on CPAP therapy but she may need a repeat study due to insurance reasons as she's had a break in therapy.  Asthma Managed by primary care. Stable on Qvar inhaler.  Plan/Recommendations: - Order CPAP. May need repeat sleep study  Chilton Greathouse [name] [facility] Pager [phone] 01/07/2017, 11:04 AM  CC: Leary Roca*

## 2017-01-07 NOTE — Patient Instructions (Addendum)
We will reorder the CPAP with supplies at current settings of 9.  Return to clinic in 3 months with CPAP download.

## 2017-01-12 ENCOUNTER — Other Ambulatory Visit (INDEPENDENT_AMBULATORY_CARE_PROVIDER_SITE_OTHER): Payer: Self-pay

## 2017-01-12 ENCOUNTER — Encounter (INDEPENDENT_AMBULATORY_CARE_PROVIDER_SITE_OTHER): Payer: Self-pay | Admitting: Family Medicine

## 2017-01-12 DIAGNOSIS — R7303 Prediabetes: Secondary | ICD-10-CM

## 2017-01-12 MED ORDER — BUPROPION HCL ER (SR) 150 MG PO TB12: 150.0000 mg | ORAL_TABLET | Freq: Every day | ORAL | 0 refills | Status: DC

## 2017-01-12 MED FILL — BUPROPION SR 150 MG TABLET: 150 | 30 days supply | Qty: 30 | Fill #0

## 2017-01-13 ENCOUNTER — Encounter: Payer: Self-pay | Admitting: Family Medicine

## 2017-01-13 ENCOUNTER — Ambulatory Visit (INDEPENDENT_AMBULATORY_CARE_PROVIDER_SITE_OTHER): Payer: 59 | Admitting: Family Medicine

## 2017-01-13 ENCOUNTER — Telehealth: Payer: Self-pay | Admitting: Pulmonary Disease

## 2017-01-13 DIAGNOSIS — G4733 Obstructive sleep apnea (adult) (pediatric): Secondary | ICD-10-CM

## 2017-01-13 NOTE — Telephone Encounter (Signed)
Per last OV notes, PM stated that he wanted to start the patient back on CPAP machine.  Pt states that she still has her old CPAP machine but needs all the supplies.   Please confirm Dr Isaiah Serge that the pressure setting needed is 9cwp for her CPAP machine.   Per last OV >>> 3. Successful CPAP titration to 9 CWP, AHI 0 per hour. She wore a medium ResMed Mirage Quattro full-face mask with heated humidifier. Snoring was prevented and mean oxygen saturation held 97.1% on room air with CPAP.  Thanks.

## 2017-01-13 NOTE — Patient Instructions (Addendum)
-   Vitamin D:  For best absorption, take with food, and with some source of fat is best.    GOALS:  - Continue to follow prescribed meal and exercise plan per Dr. Dalbert Garnet.   - Increase your TM time as tolerated and within limits suggested by Dr. Dalbert Garnet.    - While you are seeing Dr. Dalbert Garnet, let's plan to schedule appts with me about every 3 months.    - If you find yourself struggling, don't hesitate to contact me, and we can meet more often.

## 2017-01-13 NOTE — Progress Notes (Signed)
Medical Nutrition Therapy:  Appt start time: 0900 end time:  1000. Spouse: Tabitha  Assessment:  Primary concerns today: Weight management.  Dianne has started exercising at the rec center off of [address], 30-45 min up to 3 X wk (doing 10-11 min on TM).    Lawrence had some struggles when Dr. Dalbert Garnet loosened her food plan some a few weeks ago, feeling guilty for eating foods that had previously been considered "off-limits."  We talked about the very normal need for satisfaction from food, and also about the change in taste preferences that happens with changes in usual food choices.  Dominica has experienced this, saying that she can taste the sweetness in fruit, for example, much more than she could before.  Also discussed concept of self-compassion if regretted choices are made, and the shift from all focus on weight loss to a focus of best self-care.    Current prescribed food plan from [facility] includes: Breakfast: 2-3 protein, 2 X 45-kcal bread Lunch: 200-250 kcal frzn meal (w/ >20 g protein) OR 4 oz meat, 2 slc 45-kcal brd, 1 fruit, 1 c sk milk Dinner: 2 c green veg's OR 1 c veg's w/ starch, 8-10 lean protein, 1 slc 45-kcal brd Snks: up to 3 X 100 kcal, at least 1 with fiber.   Drinks may include up to 32 oz diet drinks/day OR 5-kcal powdered drinks, but Margreat is seldom drinking anything but water.    24-hr recall:  (Up at 5:30 AM) B (6 AM)-  Fiber bar (9 g protein), 2 c water To gym for 30-min workout Snk (8:20)- 3 eggs, 2 slc 45-kcal bread, water   L (3 PM)-  4 oz deli-meat sandwich, 1 apple, 1 c sk milk Snk (5:30)-  100-kcal snk: chips, water D (8 PM)-  2 c broccoli, 10 oz chx, 1 slc 45-kcal brd, 2 c water Snk (9:30)-  100-kcal snk: chips, water Typical day? Yes.    Progress Towards Goal(s):  In progress.   Nutritional Diagnosis:  Continued progress noted on NI-5.8.3 Inappropriate intake of types of carbohydrates (specify): (refined carbs) As related to  beverages.  As evidenced by water as only beverage reported.    Intervention:  Nutrition education.  Handouts given during visit include:  AVS  Demonstrated degree of understanding via:  Teach Back  Barriers to learning/adherence to lifestyle change: Longstanding h/o poor diet and exercise pattern.    Monitoring/Evaluation:  Dietary intake, exercise, and body weight in 12 week(s).  Saloma will continue to see Dr. Dalbert Garnet 2 X mo, and me once every 3 months.

## 2017-01-14 ENCOUNTER — Ambulatory Visit: Payer: 59 | Admitting: Internal Medicine

## 2017-01-15 NOTE — Telephone Encounter (Signed)
Yes. She will need CPAP at 9 cm with medium ResMed Mirage Quattro full-face

## 2017-01-15 NOTE — Telephone Encounter (Signed)
PM please advise on pressure, and ok to order supplies?  Thanks.

## 2017-01-15 NOTE — Telephone Encounter (Signed)
I have called and lmom to make the pt aware of the order that has been placed with [facility].  Nothing further is needed.

## 2017-01-19 ENCOUNTER — Ambulatory Visit (INDEPENDENT_AMBULATORY_CARE_PROVIDER_SITE_OTHER): Payer: 59 | Admitting: Family Medicine

## 2017-01-20 ENCOUNTER — Ambulatory Visit (INDEPENDENT_AMBULATORY_CARE_PROVIDER_SITE_OTHER): Payer: 59 | Admitting: Family Medicine

## 2017-01-20 VITALS — BP 115/77 | HR 79 | Temp 97.8°F | Ht 67.0 in | Wt >= 6400 oz

## 2017-01-20 DIAGNOSIS — E559 Vitamin D deficiency, unspecified: Secondary | ICD-10-CM | POA: Diagnosis not present

## 2017-01-20 DIAGNOSIS — D72828 Other elevated white blood cell count: Secondary | ICD-10-CM | POA: Diagnosis not present

## 2017-01-20 DIAGNOSIS — R7303 Prediabetes: Secondary | ICD-10-CM

## 2017-01-20 DIAGNOSIS — F3289 Other specified depressive episodes: Secondary | ICD-10-CM

## 2017-01-20 MED ORDER — VITAMIN D (ERGOCALCIFEROL) 1.25 MG (50000 UNIT) PO CAPS: 50000.0000 [IU] | ORAL_CAPSULE | ORAL | 0 refills | Status: DC

## 2017-01-20 NOTE — Progress Notes (Signed)
Office: [phone]  /  Fax: [phone]   HPI:   Chief Complaint: OBESITY Catherine [name] is here to discuss her progress with her obesity treatment plan. She is following her eating plan approximately 96 % of the time and states she is exercising range of motion/10 minutes treadmill  3 to 4 times per week. Catherine [name] is doing well with weight loss and has started to exercise. She notes her hunger is more controlled. She is following her category 3 plan more and she is working on eating all her food and not skipping meals, planning meals ahead of time. Her weight is (!) 445 lb (201.9 kg) today and has had a weight loss of 4 pounds over a period of 2 to 3 weeks since her last visit. She has lost 23 lbs since starting treatment with [name].  Vitamin D deficiency Catherine [name] has a diagnosis of vitamin D deficiency. She is currently stable on vit D and denies nausea, vomiting or muscle weakness.  Depression with emotional eating behaviors Catherine [name] is struggling with emotional eating and using food for comfort to the extent that it is negatively impacting her health. She often snacks when she is not hungry. Catherine [name] sometimes feels she is out of control and then feels guilty that she made poor food choices. She has been working on behavior modification techniques to help reduce her emotional eating and has been somewhat successful. She has no insomnia, blood pressure is stable. She has mild dry mouth, but increased H2O. She shows no sign of suicidal or homicidal ideations and her mood is stable.  Leukocytosis Catherine [name] last 2 WBC's were elevated. She had a virus last labs but feels well now. She admits fatigue but no ecchymosis or easy bleeding.  Pre-Diabetes Catherine [name] has a diagnosis of prediabetes based on her elevated Hgb A1c and was informed this puts her at greater risk of developing diabetes. She is currently stable. She is not taking metformin currently and continues to work on diet and exercise to decrease  risk of diabetes. She is attempting to diet control. She notes decreased polyphagia and denies nausea or hypoglycemia. She is due for labs.   Depression screen [facility] 2/9 11/21/2016 10/27/2016 09/08/2016 04/17/2016 11/21/2015  Decreased Interest 0 3 0 0 0  Down, Depressed, Hopeless 0 3 0 0 0  PHQ - 2 Score 0 6 0 0 0  Altered sleeping - 2 - - -  Tired, decreased energy - 2 - - -  Change in appetite - 3 - - -  Feeling bad or failure about yourself  - 2 - - -  Trouble concentrating - 3 - - -  Moving slowly or fidgety/restless - 3 - - -  Suicidal thoughts - 2 - - -  PHQ-9 Score - 23 - - -  Some encounter information is confidential and restricted. Go to Review Flowsheets activity to see all data.     Wt Readings from Last 500 Encounters:  01/20/17 (!) 445 lb (201.9 kg)  01/13/17 (!) 450 lb (204.1 kg)  01/07/17 (!) 453 lb 6.4 oz (205.7 kg)  01/01/17 (!) 449 lb (203.7 kg)  12/15/16 (!) 449 lb (203.7 kg)  11/26/16 (!) 449 lb (203.7 kg)  11/21/16 (!) 460 lb 9.6 oz (208.9 kg)  11/18/16 (!) 458 lb 9.6 oz (208 kg)  11/12/16 (!) 457 lb (207.3 kg)  10/27/16 (!) 468 lb (212.3 kg)  09/26/16 (!) 465 lb 9.6 oz (211.2 kg)  09/26/16 (!) 469 lb (212.7 kg)  09/08/16 Marland Kitchen)  468 lb 12.8 oz (212.6 kg)  08/14/16 (!) 467 lb (211.8 kg)  07/31/16 (!) 464 lb 6.4 oz (210.7 kg)  07/28/16 (!) 463 lb 12.8 oz (210.4 kg)  07/25/16 (!) 471 lb (213.6 kg)  06/04/16 (!) 463 lb 9.6 oz (210.3 kg)  04/17/16 (!) 459 lb (208.2 kg)  03/19/16 (!) 460 lb (208.7 kg)  03/03/16 (!) 464 lb 9.6 oz (210.7 kg)  02/15/16 (!) 461 lb 1.6 oz (209.2 kg)  01/29/16 (!) 450 lb 4.8 oz (204.3 kg)  12/25/15 (!) 457 lb 12.8 oz (207.7 kg)  11/21/15 (!) 444 lb 3.2 oz (201.5 kg)  09/26/15 (!) 427 lb (193.7 kg)  07/09/15 (!) 435 lb (197.3 kg)  06/11/15 (!) 431 lb 12.8 oz (195.9 kg)  05/14/15 (!) 431 lb (195.5 kg)  05/08/15 (!) 427 lb (193.7 kg)  10/04/13 (!) 409 lb (185.5 kg)  09/21/13 (!) 422 lb (191.4 kg)  09/12/13 (!) 435 lb 12.8 oz (197.7 kg)   07/29/13 (!) 406 lb (184.2 kg)  06/28/13 (!) 424 lb (192.3 kg)  06/14/13 (!) 417 lb (189.1 kg)  06/01/13 (!) 427 lb (193.7 kg)  05/06/13 (!) 417 lb (189.1 kg)  05/02/13 (!) 432 lb 3.2 oz (196 kg)  04/27/13 (!) 427 lb 12.8 oz (194 kg)     ALLERGIES: Allergies  Allergen Reactions   Citrus Swelling    MEDICATIONS: Current Outpatient Prescriptions on File Prior to Visit  Medication Sig Dispense Refill   allopurinol (ZYLOPRIM) 100 MG tablet TAKE 1 TABLET BY MOUTH DAILY. 30 tablet 2   aspirin-acetaminophen-caffeine (EXCEDRIN MIGRAINE) 250-250-65 MG per tablet Take 1 tablet by mouth 2 (two) times daily as needed for migraine. For headache     benzonatate (TESSALON) 100 MG capsule Take 1 capsule (100 mg total) by mouth 2 (two) times daily as needed for cough. 20 capsule 0   budesonide (PULMICORT) 180 MCG/ACT inhaler Inhale 1 puff into the lungs 2 (two) times daily. 1 Inhaler 1   buPROPion (WELLBUTRIN SR) 150 MG 12 hr tablet Take 1 tablet (150 mg total) by mouth daily. 30 tablet 0   cetirizine (ZYRTEC) 10 MG tablet Take 1 tablet (10 mg total) by mouth daily. 30 tablet 2   furosemide (LASIX) 20 MG tablet Take 1 tablet (20 mg total) by mouth daily. 30 tablet 3   ibuprofen (ADVIL,MOTRIN) 200 MG tablet Take 200 mg by mouth every 6 (six) hours as needed for moderate pain.     ondansetron (ZOFRAN ODT) 8 MG disintegrating tablet Take 1 tablet (8 mg total) by mouth every 8 (eight) hours as needed for nausea or vomiting. 20 tablet 0   potassium chloride (K-DUR) 10 MEQ tablet Take 1 tablet (10 mEq total) by mouth daily. 30 tablet 5   ranitidine (ZANTAC) 150 MG tablet TAKE 1 TABLET BY MOUTH 2 TIMES DAILY. 60 tablet 0   Vitamin D, Ergocalciferol, (DRISDOL) 50000 units CAPS capsule Take 1 capsule (50,000 Units total) by mouth every 7 (seven) days. 4 capsule 0   No current facility-administered medications on file prior to visit.     PAST MEDICAL HISTORY: Past Medical History:  Diagnosis  Date   Allergy    Anxiety    Arthritis    Asthma    since baby   ASTHMA, UNSPECIFIED 12/10/2006   Qualifier: Diagnosis of  By: Abundio Miu     CELLULITIS AND ABSCESS OF LEG EXCEPT FOOT 12/30/2007   Qualifier: Diagnosis of  By: Daphine Deutscher FNP, Zena Amos     DEPENDENT  EDEMA, LEGS, BILATERAL 11/22/2008   Qualifier: Diagnosis of  By: Barbaraann Barthel MD, [place]     Depression 2003Dx   Edema    Food allergy    citris   FOOT PAIN, RIGHT 11/22/2008   Qualifier: Diagnosis of  By: Barbaraann Barthel MD, [place]     Gastric ulcer    GERD (gastroesophageal reflux disease)    Gout    Hypertension    Joint pain    KNEE INJURY, LEFT 01/01/2009   Qualifier: Diagnosis of  By: Delrae Alfred MD, [place]     Migraine    ONYCHOMYCOSIS, TOENAILS 11/22/2008   Qualifier: Diagnosis of  By: Barbaraann Barthel MD, [place]     Shortness of breath    Sleep apnea    TINEA PEDIS 11/22/2008   Qualifier: Diagnosis of  By: Barbaraann Barthel MD, Benetta Spar      PAST SURGICAL HISTORY: Past Surgical History:  Procedure Laterality Date   WISDOM TOOTH EXTRACTION  2008   x4    SOCIAL HISTORY: Social History  Substance Use Topics   Smoking status: Former Smoker    Packs/day: 0.50    Years: 18.00    Types: Cigarettes    Start date: 10/13/1996    Quit date: 01/12/2015   Smokeless tobacco: Never Used   Alcohol use 0.6 oz/week    1 Standard drinks or equivalent per week     Comment: rarely drinks    FAMILY HISTORY: Family History  Problem Relation Age of Onset   Hypertension Mother    Hyperlipidemia Mother    Cancer Mother     bone cancer   Heart disease Mother    Sudden death Mother    Stroke Mother    Thyroid disease Mother    Sleep apnea Mother    Obesity Mother    Sudden death Father    Hypertension Father    Heart disease Father    Hyperlipidemia Father    Prostate cancer Father    Lung cancer Father    Obesity Father    Alcoholism Father    Sudden death Brother    Hypertension  Brother    Hyperlipidemia Brother    Heart attack Brother    Heart disease Brother    Stroke Brother    Alcoholism Brother    Drug abuse Brother    Heart disease Sister    Hypertension Sister    Thyroid cancer Sister    Diabetes Neg Hx    Colon cancer Neg Hx    Stomach cancer Neg Hx    Esophageal cancer Neg Hx    Rectal cancer Neg Hx    Liver cancer Neg Hx     ROS: Review of Systems  Constitutional: Positive for weight loss.  HENT:       Mild dry mouth  Gastrointestinal: Negative for nausea and vomiting.  Musculoskeletal:       Negative muscle weakness  Endo/Heme/Allergies:       Negative hypoglycemia  Psychiatric/Behavioral: Negative for suicidal ideas. The patient does not have insomnia.     PHYSICAL EXAM: Blood pressure 115/77, pulse 79, temperature 97.8 F (36.6 C), temperature source Oral, height  (1.702 m), weight (!) 445 lb (201.9 kg), SpO2 98 %. Body mass index is 69.7 kg/m. Physical Exam  Constitutional: She is oriented to person, place, and time. She appears well-developed and well-nourished.  Cardiovascular: Normal rate.   Pulmonary/Chest: Effort normal.  Musculoskeletal: Normal range of motion.  Neurological: She is oriented to person, place, and time.  Skin: Skin  is warm and dry.  Vitals reviewed.   RECENT LABS AND TESTS: BMET    Component Value Date/Time   NA 140 10/27/2016 1058   K 4.4 10/27/2016 1058   CL 101 10/27/2016 1058   CO2 23 10/27/2016 1058   GLUCOSE 84 10/27/2016 1058   GLUCOSE 83 06/04/2016 1155   BUN 11 10/27/2016 1058   CREATININE 0.86 10/27/2016 1058   CREATININE 0.90 06/04/2016 1155   CALCIUM 9.2 10/27/2016 1058   GFRNONAA 88 10/27/2016 1058   GFRNONAA 84 06/04/2016 1155   GFRAA 102 10/27/2016 1058   GFRAA >89 06/04/2016 1155   Lab Results  Component Value Date   HGBA1C 5.6 10/27/2016   HGBA1C 5.0 11/21/2015   Lab Results  Component Value Date   INSULIN 30.1 (H) 10/27/2016   CBC    Component  Value Date/Time   WBC 10.9 (H) 10/27/2016 1058   WBC 13.4 (H) 06/04/2016 1155   RBC 4.48 10/27/2016 1058   RBC 4.37 06/04/2016 1155   HGB 12.1 06/04/2016 1155   HCT 37.3 10/27/2016 1058   PLT 290 06/04/2016 1155   MCV 83 10/27/2016 1058   MCH 27.9 10/27/2016 1058   MCH 27.7 06/04/2016 1155   MCHC 33.5 10/27/2016 1058   MCHC 32.4 06/04/2016 1155   RDW 15.4 10/27/2016 1058   LYMPHSABS 3.4 (H) 10/27/2016 1058   MONOABS 1.3 (H) 09/21/2013 1635   EOSABS 0.1 10/27/2016 1058   BASOSABS 0.0 10/27/2016 1058   Iron/TIBC/Ferritin/ %Sat No results found for: IRON, TIBC, FERRITIN, IRONPCTSAT Lipid Panel     Component Value Date/Time   CHOL 152 10/27/2016 1058   TRIG 57 10/27/2016 1058   HDL 43 10/27/2016 1058   CHOLHDL 3.6 11/21/2015 1047   VLDL 10 11/21/2015 1047   LDLCALC 98 10/27/2016 1058   Hepatic Function Panel     Component Value Date/Time   PROT 7.1 10/27/2016 1058   ALBUMIN 3.9 10/27/2016 1058   AST 16 10/27/2016 1058   ALT 15 10/27/2016 1058   ALKPHOS 73 10/27/2016 1058   BILITOT 0.4 10/27/2016 1058      Component Value Date/Time   TSH 1.460 10/27/2016 1058   TSH 0.96 11/21/2015 1047    ASSESSMENT AND PLAN: Vitamin D deficiency - Plan: Comprehensive metabolic panel, VITAMIN D 25 Hydroxy (Vit-D Deficiency, Fractures), Vitamin D, Ergocalciferol, (DRISDOL) 50000 units CAPS capsule  Other depression  Other elevated white blood cell (WBC) count - Plan: CBC With Differential  Prediabetes - Plan: Hemoglobin A1c, Insulin, random  Morbid obesity (HCC)  PLAN:  Vitamin D Deficiency Catherine [name] was informed that low vitamin D levels contributes to fatigue and are associated with obesity, breast, and colon cancer. She agrees to continue to take prescription Vit D ,000 IU every week, we will refill for 1 month and will check labs and follow up for routine testing of vitamin D, at least 2-3 times per year. She was informed of the risk of over-replacement of vitamin D and  agrees to not increase her dose unless he discusses this with [name] first. Catherine [name] agrees to follow up with our clinic in 2 weeks.  Depression with Emotional Eating Behaviors We discussed behavior modification techniques today to help Catherine [name] deal with her emotional eating and depression. She has agreed to continue to take Wellbutrin SR 150 mg qd and agreed to follow up as directed.  Leukocytosis We will check CBC and follow.  Pre-Diabetes Catherine [name] will continue to work on weight loss, exercise, and decreasing simple carbohydrates in  her diet to help decrease the risk of diabetes. She was informed that eating too many simple carbohydrates or too many calories at one sitting increases the likelihood of GI side effects. We will check labs and Catherine [name] agreed to follow up with [name] as directed to monitor her progress.  Obesity Catherine [name] is currently in the action stage of change. As such, her goal is to continue with weight loss efforts She has agreed to follow the Category 3 plan Catherine [name] has been instructed to work up to a goal of 150 minutes of combined cardio and strengthening exercise per week for weight loss and overall health benefits. We discussed the following Behavioral Modification Stratagies today: increasing lean protein intake and increasing lower sugar fruits  Catherine [name] has agreed to follow up with our clinic in 2 weeks. She was informed of the importance of frequent follow up visits to maximize her success with intensive lifestyle modifications for her multiple health conditions.  I, [place] Crane, am acting as scribe for Quillian Quince, MD  I have reviewed the above documentation for accuracy and completeness, and I agree with the above. -Quillian Quince, MD

## 2017-01-21 LAB — COMPREHENSIVE METABOLIC PANEL
ALT: 19 IU/L (ref 0–32)
AST: 21 IU/L (ref 0–40)
Albumin/Globulin Ratio: 1.4 (ref 1.2–2.2)
Albumin: 3.9 g/dL (ref 3.5–5.5)
Alkaline Phosphatase: 65 IU/L (ref 39–117)
BUN/Creatinine Ratio: 22 (ref 9–23)
BUN: 16 mg/dL (ref 6–20)
Bilirubin Total: 0.3 mg/dL (ref 0.0–1.2)
CO2: 23 mmol/L (ref 18–29)
Calcium: 9.3 mg/dL (ref 8.7–10.2)
Chloride: 102 mmol/L (ref 96–106)
Creatinine, Ser: 0.74 mg/dL (ref 0.57–1.00)
GFR calc Af Amer: 121 mL/min/{1.73_m2} (ref >59)
GFR calc non Af Amer: 105 mL/min/{1.73_m2} (ref >59)
Globulin, Total: 2.7 g/dL (ref 1.5–4.5)
Glucose: 80 mg/dL (ref 65–99)
Potassium: 4.4 mmol/L (ref 3.5–5.2)
Sodium: 140 mmol/L (ref 134–144)
Total Protein: 6.6 g/dL (ref 6.0–8.5)

## 2017-01-21 LAB — CBC WITH DIFFERENTIAL
Basophils Absolute: 0 10*3/uL (ref 0.0–0.2)
Basos: 0 %
EOS (ABSOLUTE): 0.1 10*3/uL (ref 0.0–0.4)
Eos: 1 %
Hematocrit: 39.5 % (ref 34.0–46.6)
Hemoglobin: 12.6 g/dL (ref 11.1–15.9)
Immature Grans (Abs): 0 10*3/uL (ref 0.0–0.1)
Immature Granulocytes: 0 %
Lymphocytes Absolute: 2.9 10*3/uL (ref 0.7–3.1)
Lymphs: 31 %
MCH: 27.3 pg (ref 26.6–33.0)
MCHC: 31.9 g/dL (ref 31.5–35.7)
MCV: 86 fL (ref 79–97)
Monocytes Absolute: 0.9 10*3/uL (ref 0.1–0.9)
Monocytes: 9 %
Neutrophils Absolute: 5.4 10*3/uL (ref 1.4–7.0)
Neutrophils: 59 %
RBC: 4.61 x10E6/uL (ref 3.77–5.28)
RDW: 15.3 % (ref 12.3–15.4)
WBC: 9.3 10*3/uL (ref 3.4–10.8)

## 2017-01-21 LAB — INSULIN, RANDOM: INSULIN: 29.7 u[IU]/mL — ABNORMAL HIGH (ref 2.6–24.9)

## 2017-01-21 LAB — VITAMIN D 25 HYDROXY (VIT D DEFICIENCY, FRACTURES): Vit D, 25-Hydroxy: 26 ng/mL — ABNORMAL LOW (ref 30.0–100.0)

## 2017-01-21 LAB — HEMOGLOBIN A1C
Est. average glucose Bld gHb Est-mCnc: 108 mg/dL
Hgb A1c MFr Bld: 5.4 % (ref 4.8–5.6)

## 2017-01-26 ENCOUNTER — Telehealth: Payer: Self-pay | Admitting: Pulmonary Disease

## 2017-01-26 NOTE — Telephone Encounter (Signed)
PM [facility] is waiting for you to sign an order that was sent to them so they can get the pt her cpap and supplies.  Please advise once this has been done.

## 2017-01-28 ENCOUNTER — Other Ambulatory Visit: Payer: Self-pay

## 2017-01-28 DIAGNOSIS — G4733 Obstructive sleep apnea (adult) (pediatric): Secondary | ICD-10-CM

## 2017-02-02 ENCOUNTER — Ambulatory Visit (INDEPENDENT_AMBULATORY_CARE_PROVIDER_SITE_OTHER): Payer: 59 | Admitting: Family Medicine

## 2017-02-02 VITALS — BP 127/83 | HR 82 | Temp 97.7°F | Ht 67.0 in | Wt >= 6400 oz

## 2017-02-02 DIAGNOSIS — F3289 Other specified depressive episodes: Secondary | ICD-10-CM

## 2017-02-02 DIAGNOSIS — R7303 Prediabetes: Secondary | ICD-10-CM

## 2017-02-02 DIAGNOSIS — E8881 Metabolic syndrome: Secondary | ICD-10-CM | POA: Diagnosis not present

## 2017-02-02 DIAGNOSIS — Z9189 Other specified personal risk factors, not elsewhere classified: Secondary | ICD-10-CM | POA: Diagnosis not present

## 2017-02-02 MED ORDER — BUPROPION HCL ER (SR) 150 MG PO TB12
150.0000 mg | ORAL_TABLET | Freq: Every day | ORAL | 0 refills | Status: DC
Start: 2017-02-02 — End: 2017-02-17

## 2017-02-02 NOTE — Progress Notes (Signed)
Office: [phone]  /  Fax: [phone]   HPI:   Chief Complaint: OBESITY Catherine [name] is here to discuss her progress with her obesity treatment plan. She is following her eating plan approximately 92 % of the time and states she is exercising 20 to 25 minutes 5 times per week. Catherine [name] continues to do well with weight loss, notes hunger has improved with changing her diet, even with losing weight. She has good support at home and is doing well with planning meals ahead of time. Her weight is (!) 443 lb (200.9 kg) today and has had a weight loss of 2 pounds over a period of 2 weeks since her last visit. She has lost 25 lbs since starting treatment with [name].  Depression with emotional eating behaviors Catherine [name] started Wellbutrin and notes improved mood and decreased emotional eating. She is struggling with emotional eating and using food for comfort to the extent that it is negatively impacting her health. She often snacks when she is not hungry. Catherine [name] sometimes feels she is out of control and then feels guilty that she made poor food choices. She has no insomnia and has had 1 episode of increased anxiety but no panic attacks for > 1 year. She has been working on behavior modification techniques to help reduce her emotional eating and has been somewhat successful. No sign of suicidal or homicidal ideations, had been suicidal approximately 12 years.  At Risk for Suicide  Insulin Resistance Catherine [name] has a diagnosis of insulin resistance based on her elevated fasting insulin level >5. Although Catherine [name]'s blood glucose readings are still under good control, insulin resistance puts her at greater risk of metabolic syndrome and diabetes. She is not taking metformin currently and continues to work on diet and exercise to decrease risk of diabetes. Her Hgb A1c has improved from 5.6 to 5.4, still with elevated insulin. She is doing well on diet, exercise and weight loss.   Depression screen [facility] 2/9 11/21/2016  10/27/2016 09/08/2016 04/17/2016 11/21/2015  Decreased Interest 0 3 0 0 0  Down, Depressed, Hopeless 0 3 0 0 0  PHQ - 2 Score 0 6 0 0 0  Altered sleeping - 2 - - -  Tired, decreased energy - 2 - - -  Change in appetite - 3 - - -  Feeling bad or failure about yourself  - 2 - - -  Trouble concentrating - 3 - - -  Moving slowly or fidgety/restless - 3 - - -  Suicidal thoughts - 2 - - -  PHQ-9 Score - 23 - - -  Some encounter information is confidential and restricted. Go to Review Flowsheets activity to see all data.     Wt Readings from Last 500 Encounters:  02/02/17 (!) 443 lb (200.9 kg)  01/20/17 (!) 445 lb (201.9 kg)  01/13/17 (!) 450 lb (204.1 kg)  01/07/17 (!) 453 lb 6.4 oz (205.7 kg)  01/01/17 (!) 449 lb (203.7 kg)  12/15/16 (!) 449 lb (203.7 kg)  11/26/16 (!) 449 lb (203.7 kg)  11/21/16 (!) 460 lb 9.6 oz (208.9 kg)  11/18/16 (!) 458 lb 9.6 oz (208 kg)  11/12/16 (!) 457 lb (207.3 kg)  10/27/16 (!) 468 lb (212.3 kg)  09/26/16 (!) 465 lb 9.6 oz (211.2 kg)  09/26/16 (!) 469 lb (212.7 kg)  09/08/16 (!) 468 lb 12.8 oz (212.6 kg)  08/14/16 (!) 467 lb (211.8 kg)  07/31/16 (!) 464 lb 6.4 oz (210.7 kg)  07/28/16 (!) 463 lb 12.8 oz (  210.4 kg)  07/25/16 (!) 471 lb (213.6 kg)  06/04/16 (!) 463 lb 9.6 oz (210.3 kg)  04/17/16 (!) 459 lb (208.2 kg)  03/19/16 (!) 460 lb (208.7 kg)  03/03/16 (!) 464 lb 9.6 oz (210.7 kg)  02/15/16 (!) 461 lb 1.6 oz (209.2 kg)  01/29/16 (!) 450 lb 4.8 oz (204.3 kg)  12/25/15 (!) 457 lb 12.8 oz (207.7 kg)  11/21/15 (!) 444 lb 3.2 oz (201.5 kg)  09/26/15 (!) 427 lb (193.7 kg)  07/09/15 (!) 435 lb (197.3 kg)  06/11/15 (!) 431 lb 12.8 oz (195.9 kg)  05/14/15 (!) 431 lb (195.5 kg)  05/08/15 (!) 427 lb (193.7 kg)  10/04/13 (!) 409 lb (185.5 kg)  09/21/13 (!) 422 lb (191.4 kg)  09/12/13 (!) 435 lb 12.8 oz (197.7 kg)  07/29/13 (!) 406 lb (184.2 kg)  06/28/13 (!) 424 lb (192.3 kg)  06/14/13 (!) 417 lb (189.1 kg)  06/01/13 (!) 427 lb (193.7 kg)  05/06/13 (!)  417 lb (189.1 kg)  05/02/13 (!) 432 lb 3.2 oz (196 kg)  04/27/13 (!) 427 lb 12.8 oz (194 kg)     ALLERGIES: Allergies  Allergen Reactions  . Citrus Swelling    MEDICATIONS: Current Outpatient Prescriptions on File Prior to Visit  Medication Sig Dispense Refill  . allopurinol (ZYLOPRIM) 100 MG tablet TAKE 1 TABLET BY MOUTH DAILY. 30 tablet 2  . aspirin-acetaminophen-caffeine (EXCEDRIN MIGRAINE) 250-250-65 MG per tablet Take 1 tablet by mouth 2 (two) times daily as needed for migraine. For headache    . benzonatate (TESSALON) 100 MG capsule Take 1 capsule (100 mg total) by mouth 2 (two) times daily as needed for cough. 20 capsule 0  . budesonide (PULMICORT) 180 MCG/ACT inhaler Inhale 1 puff into the lungs 2 (two) times daily. 1 Inhaler 1  . cetirizine (ZYRTEC) 10 MG tablet Take 1 tablet (10 mg total) by mouth daily. 30 tablet 2  . furosemide (LASIX) 20 MG tablet Take 1 tablet (20 mg total) by mouth daily. 30 tablet 3  . ibuprofen (ADVIL,MOTRIN) 200 MG tablet Take 200 mg by mouth every 6 (six) hours as needed for moderate pain.    Marland Kitchen ondansetron (ZOFRAN ODT) 8 MG disintegrating tablet Take 1 tablet (8 mg total) by mouth every 8 (eight) hours as needed for nausea or vomiting. 20 tablet 0  . potassium chloride (K-DUR) 10 MEQ tablet Take 1 tablet (10 mEq total) by mouth daily. 30 tablet 5  . ranitidine (ZANTAC) 150 MG tablet TAKE 1 TABLET BY MOUTH 2 TIMES DAILY. 60 tablet 0  . Vitamin D, Ergocalciferol, (DRISDOL) 50000 units CAPS capsule Take 1 capsule (50,000 Units total) by mouth every 7 (seven) days. 4 capsule 0   No current facility-administered medications on file prior to visit.     PAST MEDICAL HISTORY: Past Medical History:  Diagnosis Date  . Allergy   . Anxiety   . Arthritis   . Asthma    since baby  . ASTHMA, UNSPECIFIED 12/10/2006   Qualifier: Diagnosis of  By: Abundio Miu    . CELLULITIS AND ABSCESS OF LEG EXCEPT FOOT 12/30/2007   Qualifier: Diagnosis of  By: Daphine Deutscher  FNP, Zena Amos    . DEPENDENT EDEMA, LEGS, BILATERAL 11/22/2008   Qualifier: Diagnosis of  By: Barbaraann Barthel MD, [place]    . Depression 2003Dx  . Edema   . Food allergy    citris  . FOOT PAIN, RIGHT 11/22/2008   Qualifier: Diagnosis of  By: Barbaraann Barthel MD, [place]    .  Gastric ulcer   . GERD (gastroesophageal reflux disease)   . Gout   . Hypertension   . Joint pain   . KNEE INJURY, LEFT 01/01/2009   Qualifier: Diagnosis of  By: Delrae Alfred MD, Lanora Manis    . Migraine   . ONYCHOMYCOSIS, TOENAILS 11/22/2008   Qualifier: Diagnosis of  By: Barbaraann Barthel MD, [place]    . Shortness of breath   . Sleep apnea   . TINEA PEDIS 11/22/2008   Qualifier: Diagnosis of  By: Barbaraann Barthel MD, Benetta Spar      PAST SURGICAL HISTORY: Past Surgical History:  Procedure Laterality Date  . WISDOM TOOTH EXTRACTION  2008   x4    SOCIAL HISTORY: Social History  Substance Use Topics  . Smoking status: Former Smoker    Packs/day: 0.50    Years: 18.00    Types: Cigarettes    Start date: 10/13/1996    Quit date: 01/12/2015  . Smokeless tobacco: Never Used  . Alcohol use 0.6 oz/week    1 Standard drinks or equivalent per week     Comment: rarely drinks    FAMILY HISTORY: Family History  Problem Relation Age of Onset  . Hypertension Mother   . Hyperlipidemia Mother   . Cancer Mother     bone cancer  . Heart disease Mother   . Sudden death Mother   . Stroke Mother   . Thyroid disease Mother   . Sleep apnea Mother   . Obesity Mother   . Sudden death Father   . Hypertension Father   . Heart disease Father   . Hyperlipidemia Father   . Prostate cancer Father   . Lung cancer Father   . Obesity Father   . Alcoholism Father   . Sudden death Brother   . Hypertension Brother   . Hyperlipidemia Brother   . Heart attack Brother   . Heart disease Brother   . Stroke Brother   . Alcoholism Brother   . Drug abuse Brother   . Heart disease Sister   . Hypertension Sister   . Thyroid cancer Sister   . Diabetes Neg Hx     . Colon cancer Neg Hx   . Stomach cancer Neg Hx   . Esophageal cancer Neg Hx   . Rectal cancer Neg Hx   . Liver cancer Neg Hx     ROS: Review of Systems  Constitutional: Positive for weight loss.  Psychiatric/Behavioral: Positive for depression. Negative for suicidal ideas. The patient does not have insomnia.     PHYSICAL EXAM: Blood pressure 127/83, pulse 82, temperature 97.7 F (36.5 C), temperature source Oral, height  (1.702 m), weight (!) 443 lb (200.9 kg), SpO2 97 %. Body mass index is 69.38 kg/m. Physical Exam  Constitutional: She is oriented to person, place, and time. She appears well-developed and well-nourished.  Cardiovascular: Normal rate.   Pulmonary/Chest: Effort normal.  Musculoskeletal: Normal range of motion.  Neurological: She is oriented to person, place, and time.  Skin: Skin is warm and dry.  Vitals reviewed.   RECENT LABS AND TESTS: BMET    Component Value Date/Time   NA 140 01/20/2017 0831   K 4.4 01/20/2017 0831   CL 102 01/20/2017 0831   CO2 23 01/20/2017 0831   GLUCOSE 80 01/20/2017 0831   GLUCOSE 83 06/04/2016 1155   BUN 16 01/20/2017 0831   CREATININE 0.74 01/20/2017 0831   CREATININE 0.90 06/04/2016 1155   CALCIUM 9.3 01/20/2017 0831   GFRNONAA 105 01/20/2017 0831  GFRNONAA 84 06/04/2016 1155   GFRAA 121 01/20/2017 0831   GFRAA >89 06/04/2016 1155   Lab Results  Component Value Date   HGBA1C 5.4 01/20/2017   HGBA1C 5.6 10/27/2016   HGBA1C 5.0 11/21/2015   Lab Results  Component Value Date   INSULIN 29.7 (H) 01/20/2017   INSULIN 30.1 (H) 10/27/2016   CBC    Component Value Date/Time   WBC 9.3 01/20/2017 0831   WBC 13.4 (H) 06/04/2016 1155   RBC 4.61 01/20/2017 0831   RBC 4.37 06/04/2016 1155   HGB 12.1 06/04/2016 1155   HCT 39.5 01/20/2017 0831   PLT 290 06/04/2016 1155   MCV 86 01/20/2017 0831   MCH 27.3 01/20/2017 0831   MCH 27.7 06/04/2016 1155   MCHC 31.9 01/20/2017 0831   MCHC 32.4 06/04/2016 1155   RDW  15.3 01/20/2017 0831   LYMPHSABS 2.9 01/20/2017 0831   MONOABS 1.3 (H) 09/21/2013 1635   EOSABS 0.1 01/20/2017 0831   BASOSABS 0.0 01/20/2017 0831   Iron/TIBC/Ferritin/ %Sat No results found for: IRON, TIBC, FERRITIN, IRONPCTSAT Lipid Panel     Component Value Date/Time   CHOL 152 10/27/2016 1058   TRIG 57 10/27/2016 1058   HDL 43 10/27/2016 1058   CHOLHDL 3.6 11/21/2015 1047   VLDL 10 11/21/2015 1047   LDLCALC 98 10/27/2016 1058   Hepatic Function Panel     Component Value Date/Time   PROT 6.6 01/20/2017 0831   ALBUMIN 3.9 01/20/2017 0831   AST 21 01/20/2017 0831   ALT 19 01/20/2017 0831   ALKPHOS 65 01/20/2017 0831   BILITOT 0.3 01/20/2017 0831      Component Value Date/Time   TSH 1.460 10/27/2016 1058   TSH 0.96 11/21/2015 1047    ASSESSMENT AND PLAN: Other depression  Insulin resistance  Prediabetes - Plan: buPROPion (WELLBUTRIN SR) 150 MG 12 hr tablet  At risk for violence to self related to depression  Morbid obesity (HCC)  PLAN:  Depression with Emotional Eating Behaviors We discussed behavior modification techniques today to help Catherine [name] deal with her emotional eating and depression. She has agreed to continue to take Wellbutrin SR 150 mg qd we will refill for 1 month and agreed to follow up as directed.  Suicide Risk Counselling Catherine [name] understands she is at higher risk of suicide due to her Bipolar and past medical history and agrees to watch for signs that she is mentally  getting in trouble. She agreed to contact me immediately  if she has any suicidal thoughts.  Insulin Resistance Catherine [name] will continue to work on weight loss, exercise, and decreasing simple carbohydrates in her diet to help decrease the risk of diabetes. She was informed that eating too many simple carbohydrates or too many calories at one sitting increases the likelihood of GI side effects. We will re-check labs in 3 months and Catherine [name] agreed to follow up with [name] as directed to  monitor her progress.  Obesity Catherine [name] is currently in the action stage of change. As such, her goal is to continue with weight loss efforts She has agreed to follow the Category 3 plan Catherine [name] has been instructed to work up to a goal of 150 minutes of combined cardio and strengthening exercise per week for weight loss and overall health benefits. We discussed the following Behavioral Modification Stratagies today: increasing lean protein intake and increasing lower sugar fruits  Catherine [name] has agreed to follow up with our clinic in 2 weeks. She was informed of the importance of frequent  follow up visits to maximize her success with intensive lifestyle modifications for her multiple health conditions.  I, [place] Crane, am acting as scribe for Quillian Quince, MD  I have reviewed the above documentation for accuracy and completeness, and I agree with the above. -Quillian Quince, MD

## 2017-02-03 NOTE — Telephone Encounter (Signed)
This has already been corrected, as the first order was placed incorrectly which is why PM was unable to see order.  Order was placed on 01/28/17 and signed by PM. Nothing further needed.

## 2017-02-03 NOTE — Telephone Encounter (Signed)
I don't see the order to sign in my EPIC inbox. Please send again.  Catherine [name]

## 2017-02-04 ENCOUNTER — Ambulatory Visit (INDEPENDENT_AMBULATORY_CARE_PROVIDER_SITE_OTHER): Payer: 59 | Admitting: Internal Medicine

## 2017-02-04 DIAGNOSIS — M5418 Radiculopathy, sacral and sacrococcygeal region: Secondary | ICD-10-CM

## 2017-02-04 MED ORDER — GABAPENTIN 100 MG PO CAPS: 100.0000 mg | ORAL_CAPSULE | Freq: Three times a day (TID) | ORAL | 3 refills | Status: DC

## 2017-02-04 MED FILL — VIT D2 1.25 MG (50,000 UNIT: 1.25 MG | 28 days supply | Qty: 4 | Fill #0

## 2017-02-04 MED FILL — GABAPENTIN 100 MG CAPSULE: 100 | 30 days supply | Qty: 90 | Fill #0 | Status: TO

## 2017-02-04 NOTE — Patient Instructions (Signed)
Would continue the ibuprofen and Tylenol. You can try gabapentin as well. There are some gray exercises to help with your back. I would continue the walking as I think this is very beneficial. Follow-up as needed for this issue. It may take some time for the pain to resolve.    Back Exercises If you have pain in your back, do these exercises 2-3 times each day or as told by your doctor. When the pain goes away, do the exercises once each day, but repeat the steps more times for each exercise (do more repetitions). If you do not have pain in your back, do these exercises once each day or as told by your doctor. Exercises Single Knee to Chest   Do these steps 3-5 times in a row for each leg: 1. Lie on your back on a firm bed or the floor with your legs stretched out. 2. Bring one knee to your chest. 3. Hold your knee to your chest by grabbing your knee or thigh. 4. Pull on your knee until you feel a gentle stretch in your lower back. 5. Keep doing the stretch for 10-30 seconds. 6. Slowly let go of your leg and straighten it. Pelvic Tilt   Do these steps 5-10 times in a row: 1. Lie on your back on a firm bed or the floor with your legs stretched out. 2. Bend your knees so they point up to the ceiling. Your feet should be flat on the floor. 3. Tighten your lower belly (abdomen) muscles to press your lower back against the floor. This will make your tailbone point up to the ceiling instead of pointing down to your feet or the floor. 4. Stay in this position for 5-10 seconds while you gently tighten your muscles and breathe evenly. Cat-Cow   Do these steps until your lower back bends more easily: 1. Get on your hands and knees on a firm surface. Keep your hands under your shoulders, and keep your knees under your hips. You may put padding under your knees. 2. Let your head hang down, and make your tailbone point down to the floor so your lower back is round like the back of a cat. 3. Stay in  this position for 5 seconds. 4. Slowly lift your head and make your tailbone point up to the ceiling so your back hangs low (sags) like the back of a cow. 5. Stay in this position for 5 seconds. Press-Ups   Do these steps 5-10 times in a row: 1. Lie on your belly (face-down) on the floor. 2. Place your hands near your head, about shoulder-width apart. 3. While you keep your back relaxed and keep your hips on the floor, slowly straighten your arms to raise the top half of your body and lift your shoulders. Do not use your back muscles. To make yourself more comfortable, you may change where you place your hands. 4. Stay in this position for 5 seconds. 5. Slowly return to lying flat on the floor. Bridges   Do these steps 10 times in a row: 1. Lie on your back on a firm surface. 2. Bend your knees so they point up to the ceiling. Your feet should be flat on the floor. 3. Tighten your butt muscles and lift your butt off of the floor until your waist is almost as high as your knees. If you do not feel the muscles working in your butt and the back of your thighs, slide your feet 1-2  inches farther away from your butt. 4. Stay in this position for 3-5 seconds. 5. Slowly lower your butt to the floor, and let your butt muscles relax. If this exercise is too easy, try doing it with your arms crossed over your chest. Belly Crunches   Do these steps 5-10 times in a row: 1. Lie on your back on a firm bed or the floor with your legs stretched out. 2. Bend your knees so they point up to the ceiling. Your feet should be flat on the floor. 3. Cross your arms over your chest. 4. Tip your chin a little bit toward your chest but do not bend your neck. 5. Tighten your belly muscles and slowly raise your chest just enough to lift your shoulder blades a tiny bit off of the floor. 6. Slowly lower your chest and your head to the floor. Back Lifts  Do these steps 5-10 times in a row: 1. Lie on your belly  (face-down) with your arms at your sides, and rest your forehead on the floor. 2. Tighten the muscles in your legs and your butt. 3. Slowly lift your chest off of the floor while you keep your hips on the floor. Keep the back of your head in line with the curve in your back. Look at the floor while you do this. 4. Stay in this position for 3-5 seconds. 5. Slowly lower your chest and your face to the floor. Contact a doctor if:  Your back pain gets a lot worse when you do an exercise.  Your back pain does not lessen 2 hours after you exercise. If you have any of these problems, stop doing the exercises. Do not do them again unless your doctor says it is okay. Get help right away if:  You have sudden, very bad back pain. If this happens, stop doing the exercises. Do not do them again unless your doctor says it is okay. This information is not intended to replace advice given to you by your health care provider. Make sure you discuss any questions you have with your health care provider. Document Released: 11/01/2010 Document Revised: 03/06/2016 Document Reviewed: 11/23/2014 Elsevier Interactive Patient Education  2017 [employer].

## 2017-02-04 NOTE — Assessment & Plan Note (Signed)
Recent with radiating pain down right leg since starting exercises concern for radiculopathy. Patient denies any significant back pain. No red flags. - Continue ibuprofen and Tylenol for pain - gabapentin (NEURONTIN) 100 MG capsule; Take 1 capsule (100 mg total) by mouth 3 (three) times daily.  Dispense: 90 capsule; Refill: 3 - Provided patient with some stretching back exercises -Follow up in 1 month

## 2017-02-04 NOTE — Progress Notes (Signed)
   [facility] Noralee Chars, MD Phone: [phone]  Reason For Visit: SDA for Hip Pain    Patient with one week of right leg pain. Notes that the pain starts at her upper extremity and radiates down into her foot No pain radiating to the groin  Some slight numbness in pinky toe on right side  Denies any back pain States that the pain started when she began doing exercises as part of her regimen given to her by her nutritionist to loss weight  Pain is worse when patient bends forward especially in the morning when she first gets u Tylenol and Ibuprofen both help with pain  No pain while on treadmill for exercise    History of trauma or injury: None  History of cancer: None  Weak immune system: None  History of IV drug use: None  History of steroid [id number]   Symptoms  Incontinence of bowel or bladder: None  Numbness of leg:yes  Fever:None  Weight Loss:  None  Rash: None  ROS see HPI Smoking Status noted.   Objective: BP 126/90   Pulse 83   Temp 97.9 F (36.6 C) (Oral)   Wt (!) 448 lb (203.2 kg)   BMI 70.17 kg/m  Gen: NAD, alert, cooperative with exam Extremities: warm, well perfused, No edema, cyanosis or clubbing;  MSK: No pain noted on palpation of back, lower sacrum, thought patient has significant large body habitus, indicated pain with flexion, none with back extension, negative straight leg test.  Skin: dry, intact, no rashes or lesions  Assessment/Plan: See problem based a/p  Sacral radiculopathy Recent with radiating pain down right leg since starting exercises concern for radiculopathy. Patient denies any significant back pain. No red flags. - Continue ibuprofen and Tylenol for pain - gabapentin (NEURONTIN) 100 MG capsule; Take 1 capsule (100 mg total) by mouth 3 (three) times daily.  Dispense: 90 capsule; Refill: 3 - Provided patient with some stretching back exercises -Follow up in 1 month

## 2017-02-13 ENCOUNTER — Encounter (INDEPENDENT_AMBULATORY_CARE_PROVIDER_SITE_OTHER): Payer: Self-pay | Admitting: Family Medicine

## 2017-02-17 ENCOUNTER — Ambulatory Visit (INDEPENDENT_AMBULATORY_CARE_PROVIDER_SITE_OTHER): Payer: 59 | Admitting: Family Medicine

## 2017-02-17 VITALS — BP 118/76 | HR 71 | Temp 97.7°F | Ht 67.0 in | Wt >= 6400 oz

## 2017-02-17 DIAGNOSIS — F3289 Other specified depressive episodes: Secondary | ICD-10-CM

## 2017-02-18 ENCOUNTER — Encounter (INDEPENDENT_AMBULATORY_CARE_PROVIDER_SITE_OTHER): Payer: Self-pay

## 2017-02-18 ENCOUNTER — Telehealth (INDEPENDENT_AMBULATORY_CARE_PROVIDER_SITE_OTHER): Payer: Self-pay | Admitting: Family Medicine

## 2017-02-18 MED ORDER — BUPROPION HCL ER (SR) 200 MG PO TB12: 200.0000 mg | ORAL_TABLET | Freq: Every morning | ORAL | 0 refills | Status: DC

## 2017-02-18 MED FILL — BUPROPION HCL SR 200 MG TAB: 200 | 30 days supply | Qty: 30 | Fill #0

## 2017-02-18 NOTE — Telephone Encounter (Signed)
Pt called stating her Welbutrin rx is to be increased per her visit yesterday. She says the pharmacy has not received a new rx with the increase. Please call pt back to give update. Thank you

## 2017-02-18 NOTE — Telephone Encounter (Signed)
Patient's prescription was sent to her pharmacy by Dr Dalbert GarnetBeasley. Catherine [name], CMA

## 2017-02-19 NOTE — Progress Notes (Signed)
Office: [phone]  /  Fax: [phone]   HPI:   Chief Complaint: OBESITY Catherine [name] is here to discuss her progress with her obesity treatment plan. She is on the  follow the Category 3 plan and is following her eating plan approximately 75 % of the time. She states she is walking and gym 25 minutes 4 to 5 times per week. Catherine [name] is struggling to follow plan closely. She increased small snacks which increased calories. She's had increased emotional eating in the last 2 weeks. Her weight is (!) 445 lb (201.9 kg) today and has had a weight gain of 2 pounds over a period of 2 weeks since her last visit. She has lost 23 lbs since starting treatment with us.  Depression with emotional eating behaviors Catherine [name] is struggling with emotional eating and using food for comfort to the extent that it is negatively impacting her health. She often snacks when she is not hungry. Catherine [name] sometimes feels she is out of control and then feels guilty that she made poor food choices. Her mood is stable but she is still struggling to decrease emotional/stress eating on wellbutrin and feels it isn't helping enough. She has been working on behavior modification techniques to help reduce her emotional eating and has been somewhat successful. She shows no sign of suicidal or homicidal ideations.  [facility] 02/04/2017 11/21/2016 10/27/2016 09/08/2016 04/17/2016  Decreased Interest 0 0 3 0 0  Down, Depressed, Hopeless 0 0 3 0 0  PHQ - 2 Score 0 0 6 0 0  Altered sleeping - - 2 - -  Tired, decreased energy - - 2 - -  Change in appetite - - 3 - -  Feeling bad or failure about yourself  - - 2 - -  Trouble concentrating - - 3 - -  Moving slowly or fidgety/restless - - 3 - -  Suicidal thoughts - - 2 - -  PHQ-9 Score - - 23 - -  Some encounter information is confidential and restricted. Go to Review Flowsheets activity to see all data.     Wt Readings from Last 500 Encounters:  02/17/17 (!) 445 lb (201.9  kg)  02/04/17 (!) 448 lb (203.2 kg)  02/02/17 (!) 443 lb (200.9 kg)  01/20/17 (!) 445 lb (201.9 kg)  01/13/17 (!) 450 lb (204.1 kg)  01/07/17 (!) 453 lb 6.4 oz (205.7 kg)  01/01/17 (!) 449 lb (203.7 kg)  12/15/16 (!) 449 lb (203.7 kg)  11/26/16 (!) 449 lb (203.7 kg)  11/21/16 (!) 460 lb 9.6 oz (208.9 kg)  11/18/16 (!) 458 lb 9.6 oz (208 kg)  11/12/16 (!) 457 lb (207.3 kg)  10/27/16 (!) 468 lb (212.3 kg)  09/26/16 (!) 465 lb 9.6 oz (211.2 kg)  09/26/16 (!) 469 lb (212.7 kg)  09/08/16 (!) 468 lb 12.8 oz (212.6 kg)  08/14/16 (!) 467 lb (211.8 kg)  07/31/16 (!) 464 lb 6.4 oz (210.7 kg)  07/28/16 (!) 463 lb 12.8 oz (210.4 kg)  07/25/16 (!) 471 lb (213.6 kg)  06/04/16 (!) 463 lb 9.6 oz (210.3 kg)  04/17/16 (!) 459 lb (208.2 kg)  03/19/16 (!) 460 lb (208.7 kg)  03/03/16 (!) 464 lb 9.6 oz (210.7 kg)  02/15/16 (!) 461 lb 1.6 oz (209.2 kg)  01/29/16 (!) 450 lb 4.8 oz (204.3 kg)  12/25/15 (!) 457 lb 12.8 oz (207.7 kg)  11/21/15 (!) 444 lb 3.2 oz (201.5 kg)  09/26/15 (!) 427 lb (193.7 kg)  07/09/15 (!) 435  lb (197.3 kg)  06/11/15 (!) 431 lb 12.8 oz (195.9 kg)  05/14/15 (!) 431 lb (195.5 kg)  05/08/15 (!) 427 lb (193.7 kg)  10/04/13 (!) 409 lb (185.5 kg)  09/21/13 (!) 422 lb (191.4 kg)  09/12/13 (!) 435 lb 12.8 oz (197.7 kg)  07/29/13 (!) 406 lb (184.2 kg)  06/28/13 (!) 424 lb (192.3 kg)  06/14/13 (!) 417 lb (189.1 kg)  06/01/13 (!) 427 lb (193.7 kg)  05/06/13 (!) 417 lb (189.1 kg)  05/02/13 (!) 432 lb 3.2 oz (196 kg)  04/27/13 (!) 427 lb 12.8 oz (194 kg)     ALLERGIES: Allergies  Allergen Reactions  . Citrus Swelling    MEDICATIONS: Current Outpatient Prescriptions on File Prior to Visit  Medication Sig Dispense Refill  . allopurinol (ZYLOPRIM) 100 MG tablet TAKE 1 TABLET BY MOUTH DAILY. 30 tablet 2  . aspirin-acetaminophen-caffeine (EXCEDRIN MIGRAINE) 250-250-65 MG per tablet Take 1 tablet by mouth 2 (two) times daily as needed for migraine. For headache    . benzonatate  (TESSALON) 100 MG capsule Take 1 capsule (100 mg total) by mouth 2 (two) times daily as needed for cough. 20 capsule 0  . budesonide (PULMICORT) 180 MCG/ACT inhaler Inhale 1 puff into the lungs 2 (two) times daily. 1 Inhaler 1  . cetirizine (ZYRTEC) 10 MG tablet Take 1 tablet (10 mg total) by mouth daily. 30 tablet 2  . furosemide (LASIX) 20 MG tablet Take 1 tablet (20 mg total) by mouth daily. 30 tablet 3  . gabapentin (NEURONTIN) 100 MG capsule Take 1 capsule (100 mg total) by mouth 3 (three) times daily. 90 capsule 3  . ibuprofen (ADVIL,MOTRIN) 200 MG tablet Take 200 mg by mouth every 6 (six) hours as needed for moderate pain.    Marland Kitchen ondansetron (ZOFRAN ODT) 8 MG disintegrating tablet Take 1 tablet (8 mg total) by mouth every 8 (eight) hours as needed for nausea or vomiting. 20 tablet 0  . potassium chloride (K-DUR) 10 MEQ tablet Take 1 tablet (10 mEq total) by mouth daily. 30 tablet 5  . ranitidine (ZANTAC) 150 MG tablet TAKE 1 TABLET BY MOUTH 2 TIMES DAILY. 60 tablet 0  . Vitamin D, Ergocalciferol, (DRISDOL) 50000 units CAPS capsule Take 1 capsule (50,000 Units total) by mouth every 7 (seven) days. 4 capsule 0   No current facility-administered medications on file prior to visit.     PAST MEDICAL HISTORY: Past Medical History:  Diagnosis Date  . Allergy   . Anxiety   . Arthritis   . Asthma    since baby  . ASTHMA, UNSPECIFIED 12/10/2006   Qualifier: Diagnosis of  By: Abundio Miu    . CELLULITIS AND ABSCESS OF LEG EXCEPT FOOT 12/30/2007   Qualifier: Diagnosis of  By: Daphine Deutscher FNP, Zena Amos    . DEPENDENT EDEMA, LEGS, BILATERAL 11/22/2008   Qualifier: Diagnosis of  By: Barbaraann Barthel MD, [place]    . Depression 2003Dx  . Edema   . Food allergy    citris  . FOOT PAIN, RIGHT 11/22/2008   Qualifier: Diagnosis of  By: Barbaraann Barthel MD, [place]    . Gastric ulcer   . GERD (gastroesophageal reflux disease)   . Gout   . Hypertension   . Joint pain   . KNEE INJURY, LEFT 01/01/2009   Qualifier:  Diagnosis of  By: Delrae Alfred MD, Lanora Manis    . Migraine   . ONYCHOMYCOSIS, TOENAILS 11/22/2008   Qualifier: Diagnosis of  By: Barbaraann Barthel MD, [place]    . Shortness  of breath   . Sleep apnea   . TINEA PEDIS 11/22/2008   Qualifier: Diagnosis of  By: Barbaraann Barthel MD, Benetta Spar      PAST SURGICAL HISTORY: Past Surgical History:  Procedure Laterality Date  . WISDOM TOOTH EXTRACTION  2008   x4    SOCIAL HISTORY: Social History  Substance Use Topics  . Smoking status: Former Smoker    Packs/day: 0.50    Years: 18.00    Types: Cigarettes    Start date: 10/13/1996    Quit date: 01/12/2015  . Smokeless tobacco: Never Used  . Alcohol use 0.6 oz/week    1 Standard drinks or equivalent per week     Comment: rarely drinks    FAMILY HISTORY: Family History  Problem Relation Age of Onset  . Hypertension Mother   . Hyperlipidemia Mother   . Cancer Mother        bone cancer  . Heart disease Mother   . Sudden death Mother   . Stroke Mother   . Thyroid disease Mother   . Sleep apnea Mother   . Obesity Mother   . Sudden death Father   . Hypertension Father   . Heart disease Father   . Hyperlipidemia Father   . Prostate cancer Father   . Lung cancer Father   . Obesity Father   . Alcoholism Father   . Sudden death Brother   . Hypertension Brother   . Hyperlipidemia Brother   . Heart attack Brother   . Heart disease Brother   . Stroke Brother   . Alcoholism Brother   . Drug abuse Brother   . Heart disease Sister   . Hypertension Sister   . Thyroid cancer Sister   . Diabetes Neg Hx   . Colon cancer Neg Hx   . Stomach cancer Neg Hx   . Esophageal cancer Neg Hx   . Rectal cancer Neg Hx   . Liver cancer Neg Hx     ROS: Review of Systems  Constitutional: Negative for weight loss.  Psychiatric/Behavioral: Positive for depression. Negative for suicidal ideas.       Stress    PHYSICAL EXAM: Blood pressure 118/76, pulse 71, temperature 97.7 F (36.5 C), temperature source Oral,  height 5\' 7"  (1.702 m), weight (!) 445 lb (201.9 kg), SpO2 99 %. Body mass index is 69.7 kg/m. Physical Exam  Constitutional: She is oriented to person, place, and time. She appears well-developed and well-nourished.  Cardiovascular: Normal rate.   Pulmonary/Chest: Effort normal.  Neurological: She is oriented to person, place, and time.  Skin: Skin is warm and dry.  Vitals reviewed.   RECENT LABS AND TESTS: BMET    Component Value Date/Time   NA 140 01/20/2017 0831   K 4.4 01/20/2017 0831   CL 102 01/20/2017 0831   CO2 23 01/20/2017 0831   GLUCOSE 80 01/20/2017 0831   GLUCOSE 83 06/04/2016 1155   BUN 16 01/20/2017 0831   CREATININE 0.74 01/20/2017 0831   CREATININE 0.90 06/04/2016 1155   CALCIUM 9.3 01/20/2017 0831   GFRNONAA 105 01/20/2017 0831   GFRNONAA 84 06/04/2016 1155   GFRAA 121 01/20/2017 0831   GFRAA >89 06/04/2016 1155   Lab Results  Component Value Date   HGBA1C 5.4 01/20/2017   HGBA1C 5.6 10/27/2016   HGBA1C 5.0 11/21/2015   Lab Results  Component Value Date   INSULIN 29.7 (H) 01/20/2017   INSULIN 30.1 (H) 10/27/2016   CBC    Component Value Date/Time  WBC 9.3 01/20/2017 0831   WBC 13.4 (H) 06/04/2016 1155   RBC 4.61 01/20/2017 0831   RBC 4.37 06/04/2016 1155   HGB 12.1 06/04/2016 1155   HCT 39.5 01/20/2017 0831   PLT 290 06/04/2016 1155   MCV 86 01/20/2017 0831   MCH 27.3 01/20/2017 0831   MCH 27.7 06/04/2016 1155   MCHC 31.9 01/20/2017 0831   MCHC 32.4 06/04/2016 1155   RDW 15.3 01/20/2017 0831   LYMPHSABS 2.9 01/20/2017 0831   MONOABS 1.3 (H) 09/21/2013 1635   EOSABS 0.1 01/20/2017 0831   BASOSABS 0.0 01/20/2017 0831   Iron/TIBC/Ferritin/ %Sat No results found for: IRON, TIBC, FERRITIN, IRONPCTSAT Lipid Panel     Component Value Date/Time   CHOL 152 10/27/2016 1058   TRIG 57 10/27/2016 1058   HDL 43 10/27/2016 1058   CHOLHDL 3.6 11/21/2015 1047   VLDL 10 11/21/2015 1047   LDLCALC 98 10/27/2016 1058   Hepatic Function Panel       Component Value Date/Time   PROT 6.6 01/20/2017 0831   ALBUMIN 3.9 01/20/2017 0831   AST 21 01/20/2017 0831   ALT 19 01/20/2017 0831   ALKPHOS 65 01/20/2017 0831   BILITOT 0.3 01/20/2017 0831      Component Value Date/Time   TSH 1.460 10/27/2016 1058   TSH 0.96 11/21/2015 1047    ASSESSMENT AND PLAN: Other depression - Plan: buPROPion (WELLBUTRIN SR) 200 MG 12 hr tablet  Morbid obesity (HCC) - BMI 69.7  PLAN:  Depression with Emotional Eating Behaviors We discussed behavior modification techniques today to help Ariannie deal with her emotional eating and depression. She has agreed to increase Wellbutrin SR 150 mg qd to Wellbutrin SR 200 mg every morning #30 with no refills and agreed to follow up as directed.  Obesity Liany is currently in the action stage of change. As such, her goal is to continue with weight loss efforts She has agreed to follow the Category 3 plan Britani has been instructed to work up to a goal of 150 minutes of combined cardio and strengthening exercise per week for weight loss and overall health benefits. We discussed the following Behavioral Modification Stratagies today: increasing lean protein intake and emotional eating strategies  Kya has agreed to follow up with our clinic in 2 weeks. She was informed of the importance of frequent follow up visits to maximize her success with intensive lifestyle modifications for her multiple health conditions.  I, [place] Crane, am acting as scribe for Quillian Quince, MD  I have reviewed the above documentation for accuracy and completeness, and I agree with the above. -Quillian Quince, MD

## 2017-02-24 DIAGNOSIS — G4733 Obstructive sleep apnea (adult) (pediatric): Secondary | ICD-10-CM | POA: Diagnosis not present

## 2017-02-26 MED FILL — FUROSEMIDE 20 MG TABLET: 20 | 30 days supply | Qty: 30 | Fill #1 | Status: TO

## 2017-02-27 ENCOUNTER — Telehealth: Payer: 59 | Admitting: Family

## 2017-02-27 DIAGNOSIS — M545 Low back pain: Secondary | ICD-10-CM

## 2017-02-27 MED ORDER — BACLOFEN 10 MG PO TABS: 10.0000 mg | ORAL_TABLET | Freq: Three times a day (TID) | ORAL | 0 refills | Status: DC | PRN

## 2017-02-27 MED FILL — BACLOFEN 10 MG TABLET: 10 | 10 days supply | Qty: 30 | Fill #0

## 2017-02-27 NOTE — Progress Notes (Signed)

## 2017-03-03 ENCOUNTER — Ambulatory Visit (INDEPENDENT_AMBULATORY_CARE_PROVIDER_SITE_OTHER): Payer: 59 | Admitting: Family Medicine

## 2017-03-03 VITALS — BP 125/83 | HR 82 | Temp 97.6°F | Ht 67.0 in | Wt >= 6400 oz

## 2017-03-03 DIAGNOSIS — E559 Vitamin D deficiency, unspecified: Secondary | ICD-10-CM

## 2017-03-03 MED ORDER — VITAMIN D (ERGOCALCIFEROL) 1.25 MG (50000 UNIT) PO CAPS: 50000.0000 [IU] | ORAL_CAPSULE | ORAL | 0 refills | Status: DC

## 2017-03-03 MED FILL — VIT D2 1.25 MG (50,000 UNIT: 1.25 MG | 28 days supply | Qty: 4 | Fill #0

## 2017-03-03 NOTE — Progress Notes (Signed)
Office: [phone]  /  Fax: [phone]   HPI:   Chief Complaint: OBESITY Catherine [name] is here to discuss her progress with her obesity treatment plan. She is on the  follow the Category 3 plan and is following her eating plan approximately 98 % of the time. She states she is exercising on the treadmill and doing hand free weights  For 20-25 minutes 3-4  times per week. Catherine [name] continues to do well with weight loss. She continues to work on portion control and choosing lower calorie snacks.  Her weight is (!) 440 lb (199.6 kg) today and has had a weight loss of 5 pounds over a period of 2 weeks since her last visit. She has lost 28 lbs since starting treatment with [name].   Vitamin D deficiency Catherine [name] has a diagnosis of vitamin D deficiency. She is currently taking vit D and denies nausea, vomiting or muscle weakness.  ALLERGIES: Allergies  Allergen Reactions  . Citrus Swelling    MEDICATIONS: Current Outpatient Prescriptions on File Prior to Visit  Medication Sig Dispense Refill  . allopurinol (ZYLOPRIM) 100 MG tablet TAKE 1 TABLET BY MOUTH DAILY. 30 tablet 2  . aspirin-acetaminophen-caffeine (EXCEDRIN MIGRAINE) 250-250-65 MG per tablet Take 1 tablet by mouth 2 (two) times daily as needed for migraine. For headache    . baclofen (LIORESAL) 10 MG tablet Take 1 tablet (10 mg total) by mouth 3 (three) times daily as needed for muscle spasms. 30 each 0  . benzonatate (TESSALON) 100 MG capsule Take 1 capsule (100 mg total) by mouth 2 (two) times daily as needed for cough. 20 capsule 0  . budesonide (PULMICORT) 180 MCG/ACT inhaler Inhale 1 puff into the lungs 2 (two) times daily. 1 Inhaler 1  . buPROPion (WELLBUTRIN SR) 200 MG 12 hr tablet Take 1 tablet (200 mg total) by mouth every morning. 30 tablet 0  . cetirizine (ZYRTEC) 10 MG tablet Take 1 tablet (10 mg total) by mouth daily. 30 tablet 2  . furosemide (LASIX) 20 MG tablet Take 1 tablet (20 mg total) by mouth daily. 30 tablet 3  .  gabapentin (NEURONTIN) 100 MG capsule Take 1 capsule (100 mg total) by mouth 3 (three) times daily. 90 capsule 3  . ibuprofen (ADVIL,MOTRIN) 200 MG tablet Take 200 mg by mouth every 6 (six) hours as needed for moderate pain.    Marland Kitchen ondansetron (ZOFRAN ODT) 8 MG disintegrating tablet Take 1 tablet (8 mg total) by mouth every 8 (eight) hours as needed for nausea or vomiting. 20 tablet 0  . potassium chloride (K-DUR) 10 MEQ tablet Take 1 tablet (10 mEq total) by mouth daily. 30 tablet 5  . ranitidine (ZANTAC) 150 MG tablet TAKE 1 TABLET BY MOUTH 2 TIMES DAILY. 60 tablet 0  . Vitamin D, Ergocalciferol, (DRISDOL) 50000 units CAPS capsule Take 1 capsule (50,000 Units total) by mouth every 7 (seven) days. 4 capsule 0   No current facility-administered medications on file prior to visit.     PAST MEDICAL HISTORY: Past Medical History:  Diagnosis Date  . Allergy   . Anxiety   . Arthritis   . Asthma    since baby  . ASTHMA, UNSPECIFIED 12/10/2006   Qualifier: Diagnosis of  By: Abundio Miu    . CELLULITIS AND ABSCESS OF LEG EXCEPT FOOT 12/30/2007   Qualifier: Diagnosis of  By: Daphine Deutscher FNP, Zena Amos    . DEPENDENT EDEMA, LEGS, BILATERAL 11/22/2008   Qualifier: Diagnosis of  By: Barbaraann Barthel MD, [place]    .  Depression 2003Dx  . Edema   . Food allergy    citris  . FOOT PAIN, RIGHT 11/22/2008   Qualifier: Diagnosis of  By: Barbaraann Barthel MD, [place]    . Gastric ulcer   . GERD (gastroesophageal reflux disease)   . Gout   . Hypertension   . Joint pain   . KNEE INJURY, LEFT 01/01/2009   Qualifier: Diagnosis of  By: Delrae Alfred MD, Lanora Manis    . Migraine   . ONYCHOMYCOSIS, TOENAILS 11/22/2008   Qualifier: Diagnosis of  By: Barbaraann Barthel MD, [place]    . Shortness of breath   . Sleep apnea   . TINEA PEDIS 11/22/2008   Qualifier: Diagnosis of  By: Barbaraann Barthel MD, Benetta Spar      PAST SURGICAL HISTORY: Past Surgical History:  Procedure Laterality Date  . WISDOM TOOTH EXTRACTION  2008   x4    SOCIAL  HISTORY: Social History  Substance Use Topics  . Smoking status: Former Smoker    Packs/day: 0.50    Years: 18.00    Types: Cigarettes    Start date: 10/13/1996    Quit date: 01/12/2015  . Smokeless tobacco: Never Used  . Alcohol use 0.6 oz/week    1 Standard drinks or equivalent per week     Comment: rarely drinks    FAMILY HISTORY: Family History  Problem Relation Age of Onset  . Hypertension Mother   . Hyperlipidemia Mother   . Cancer Mother        bone cancer  . Heart disease Mother   . Sudden death Mother   . Stroke Mother   . Thyroid disease Mother   . Sleep apnea Mother   . Obesity Mother   . Sudden death Father   . Hypertension Father   . Heart disease Father   . Hyperlipidemia Father   . Prostate cancer Father   . Lung cancer Father   . Obesity Father   . Alcoholism Father   . Sudden death Brother   . Hypertension Brother   . Hyperlipidemia Brother   . Heart attack Brother   . Heart disease Brother   . Stroke Brother   . Alcoholism Brother   . Drug abuse Brother   . Heart disease Sister   . Hypertension Sister   . Thyroid cancer Sister   . Diabetes Neg Hx   . Colon cancer Neg Hx   . Stomach cancer Neg Hx   . Esophageal cancer Neg Hx   . Rectal cancer Neg Hx   . Liver cancer Neg Hx     ROS: Review of Systems  Constitutional: Positive for weight loss.  Gastrointestinal: Negative for nausea and vomiting.  Musculoskeletal:       Negative muscle weakness    PHYSICAL EXAM: Blood pressure 125/83, pulse 82, temperature 97.6 F (36.4 C), temperature source Oral, height 5\' 7"  (1.702 m), weight (!) 440 lb (199.6 kg), SpO2 99 %. Body mass index is 68.91 kg/m. Physical Exam  Constitutional: She is oriented to person, place, and time. She appears well-developed and well-nourished.  Walking with a cane  Cardiovascular: Normal rate.   Pulmonary/Chest: Effort normal.  Musculoskeletal: Normal range of motion.  Neurological: She is alert and oriented to  person, place, and time.  Skin: Skin is warm and dry.    RECENT LABS AND TESTS: BMET    Component Value Date/Time   NA 140 01/20/2017 0831   K 4.4 01/20/2017 0831   CL 102 01/20/2017 0831   CO2 23 01/20/2017  0831   GLUCOSE 80 01/20/2017 0831   GLUCOSE 83 06/04/2016 1155   BUN 16 01/20/2017 0831   CREATININE 0.74 01/20/2017 0831   CREATININE 0.90 06/04/2016 1155   CALCIUM 9.3 01/20/2017 0831   GFRNONAA 105 01/20/2017 0831   GFRNONAA 84 06/04/2016 1155   GFRAA 121 01/20/2017 0831   GFRAA >89 06/04/2016 1155   Lab Results  Component Value Date   HGBA1C 5.4 01/20/2017   HGBA1C 5.6 10/27/2016   HGBA1C 5.0 11/21/2015   Lab Results  Component Value Date   INSULIN 29.7 (H) 01/20/2017   INSULIN 30.1 (H) 10/27/2016   CBC    Component Value Date/Time   WBC 9.3 01/20/2017 0831   WBC 13.4 (H) 06/04/2016 1155   RBC 4.61 01/20/2017 0831   RBC 4.37 06/04/2016 1155   HGB 12.1 06/04/2016 1155   HCT 39.5 01/20/2017 0831   PLT 290 06/04/2016 1155   MCV 86 01/20/2017 0831   MCH 27.3 01/20/2017 0831   MCH 27.7 06/04/2016 1155   MCHC 31.9 01/20/2017 0831   MCHC 32.4 06/04/2016 1155   RDW 15.3 01/20/2017 0831   LYMPHSABS 2.9 01/20/2017 0831   MONOABS 1.3 (H) 09/21/2013 1635   EOSABS 0.1 01/20/2017 0831   BASOSABS 0.0 01/20/2017 0831   Iron/TIBC/Ferritin/ %Sat No results found for: IRON, TIBC, FERRITIN, IRONPCTSAT Lipid Panel     Component Value Date/Time   CHOL 152 10/27/2016 1058   TRIG 57 10/27/2016 1058   HDL 43 10/27/2016 1058   CHOLHDL 3.6 11/21/2015 1047   VLDL 10 11/21/2015 1047   LDLCALC 98 10/27/2016 1058   Hepatic Function Panel     Component Value Date/Time   PROT 6.6 01/20/2017 0831   ALBUMIN 3.9 01/20/2017 0831   AST 21 01/20/2017 0831   ALT 19 01/20/2017 0831   ALKPHOS 65 01/20/2017 0831   BILITOT 0.3 01/20/2017 0831      Component Value Date/Time   TSH 1.460 10/27/2016 1058   TSH 0.96 11/21/2015 1047    ASSESSMENT AND PLAN: Vitamin D  deficiency - Plan: Vitamin D, Ergocalciferol, (DRISDOL) 50000 units CAPS capsule  Morbid obesity (HCC) - BMI 69  PLAN: Vitamin D Deficiency Catherine [name] was informed that low vitamin D levels contributes to fatigue and are associated with obesity, breast, and colon cancer. She agrees to continue to take prescription Vit D @50 ,000 IU every week and will follow up for routine testing of vitamin D, at least 2-3 times per year. She was informed of the risk of over-replacement of vitamin D and agrees to not increase her dose unless he discusses this with us first.  Obesity Catherine [name] is currently in the action stage of change. As such, her goal is to continue with weight loss efforts She has agreed to follow the Category 3 plan Catherine [name] has been instructed to work up to a goal of 150 minutes of combined cardio and strengthening exercise per week for weight loss and overall health benefits. We discussed the following Behavioral Modification Stratagies today: holiday eating strategies  and decrease snacking    Catherine [name] has agreed to follow up with our clinic in 2 weeks. She was informed of the importance of frequent follow up visits to maximize her success with intensive lifestyle modifications for her multiple health conditions.  I, [place] CraneJoanne Murray, am acting as scribe for Quillian Quincearen Beasley, MD  I have reviewed the above documentation for accuracy and completeness, and I agree with the above. -Quillian Quincearen Beasley, MD

## 2017-03-11 MED FILL — GABAPENTIN 100 MG CAPSULE: 100 | 30 days supply | Qty: 90 | Fill #0

## 2017-03-17 ENCOUNTER — Ambulatory Visit (INDEPENDENT_AMBULATORY_CARE_PROVIDER_SITE_OTHER): Payer: 59 | Admitting: Family Medicine

## 2017-03-17 VITALS — BP 110/76 | HR 90 | Temp 97.8°F | Ht 67.0 in | Wt >= 6400 oz

## 2017-03-17 DIAGNOSIS — F3289 Other specified depressive episodes: Secondary | ICD-10-CM | POA: Diagnosis not present

## 2017-03-17 DIAGNOSIS — E559 Vitamin D deficiency, unspecified: Secondary | ICD-10-CM

## 2017-03-17 MED ORDER — BUPROPION HCL ER (SR) 200 MG PO TB12: 200.0000 mg | ORAL_TABLET | Freq: Every morning | ORAL | 0 refills | Status: DC

## 2017-03-17 MED FILL — BUPROPION HCL SR 200 MG TAB: 200 | 30 days supply | Qty: 30 | Fill #0

## 2017-03-17 NOTE — Progress Notes (Signed)
Office: [phone]  /  Fax: [phone]   HPI:   Chief Complaint: OBESITY Catherine [name] is here to discuss her progress with her obesity treatment plan. She is on the  follow the Category 3 plan and is following her eating plan approximately 95 % of the time. She states she is exercising free hand weights/treadmill for 25 minutes 3 to 4 times per week. Catherine [name] continues to do well with weight loss even with increased temptations from the kids and with family celebrations. Her weight is (!) 439 lb (199.1 kg) today and has had a weight loss of 1 pound over a period of 2 weeks since her last visit. She has lost 29 lbs since starting treatment with us.  Vitamin D deficiency Catherine [name] has a diagnosis of vitamin D deficiency. She is currently taking vit D and denies nausea, vomiting or muscle weakness.  Depression with emotional eating behaviors Catherine [name] is struggling with emotional eating and using food for comfort to the extent that it is negatively impacting her health. She often snacks when she is not hungry. Catherine [name] sometimes feels she is out of control and then feels guilty that she made poor food choices. She has been working on behavior modification techniques to help reduce her emotional eating and has been somewhat successful. She shows no sign of suicidal or homicidal ideations.  Dep[facility] 02/04/2017 11/21/2016 10/27/2016 09/08/2016 04/17/2016  Decreased Interest 0 0 3 0 0  Down, Depressed, Hopeless 0 0 3 0 0  PHQ - 2 Score 0 0 6 0 0  Altered sleeping - - 2 - -  Tired, decreased energy - - 2 - -  Change in appetite - - 3 - -  Feeling bad or failure about yourself  - - 2 - -  Trouble concentrating - - 3 - -  Moving slowly or fidgety/restless - - 3 - -  Suicidal thoughts - - 2 - -  PHQ-9 Score - - 23 - -  Some encounter information is confidential and restricted. Go to Review Flowsheets activity to see all data.     ALLERGIES: Allergies  Allergen Reactions   Citrus  Swelling    MEDICATIONS: Current Outpatient Prescriptions on File Prior to Visit  Medication Sig Dispense Refill   allopurinol (ZYLOPRIM) 100 MG tablet TAKE 1 TABLET BY MOUTH DAILY. 30 tablet 2   aspirin-acetaminophen-caffeine (EXCEDRIN MIGRAINE) 250-250-65 MG per tablet Take 1 tablet by mouth 2 (two) times daily as needed for migraine. For headache     baclofen (LIORESAL) 10 MG tablet Take 1 tablet (10 mg total) by mouth 3 (three) times daily as needed for muscle spasms. 30 each 0   benzonatate (TESSALON) 100 MG capsule Take 1 capsule (100 mg total) by mouth 2 (two) times daily as needed for cough. 20 capsule 0   budesonide (PULMICORT) 180 MCG/ACT inhaler Inhale 1 puff into the lungs 2 (two) times daily. 1 Inhaler 1   cetirizine (ZYRTEC) 10 MG tablet Take 1 tablet (10 mg total) by mouth daily. 30 tablet 2   furosemide (LASIX) 20 MG tablet Take 1 tablet (20 mg total) by mouth daily. 30 tablet 3   gabapentin (NEURONTIN) 100 MG capsule Take 1 capsule (100 mg total) by mouth 3 (three) times daily. 90 capsule 3   ibuprofen (ADVIL,MOTRIN) 200 MG tablet Take 200 mg by mouth every 6 (six) hours as needed for moderate pain.     ondansetron (ZOFRAN ODT) 8 MG disintegrating tablet Take 1 tablet (8 mg  total) by mouth every 8 (eight) hours as needed for nausea or vomiting. 20 tablet 0   potassium chloride (K-DUR) 10 MEQ tablet Take 1 tablet (10 mEq total) by mouth daily. 30 tablet 5   ranitidine (ZANTAC) 150 MG tablet TAKE 1 TABLET BY MOUTH 2 TIMES DAILY. 60 tablet 0   Vitamin D, Ergocalciferol, (DRISDOL) 50000 units CAPS capsule Take 1 capsule (50,000 Units total) by mouth every 7 (seven) days. 4 capsule 0   No current facility-administered medications on file prior to visit.     PAST MEDICAL HISTORY: Past Medical History:  Diagnosis Date   Allergy    Anxiety    Arthritis    Asthma    since baby   ASTHMA, UNSPECIFIED 12/10/2006   Qualifier: Diagnosis of  By: Abundio Miu       CELLULITIS AND ABSCESS OF LEG EXCEPT FOOT 12/30/2007   Qualifier: Diagnosis of  By: Daphine Deutscher FNP, [place]     DEPENDENT EDEMA, LEGS, BILATERAL 11/22/2008   Qualifier: Diagnosis of  By: Barbaraann Barthel MD, [place]     Depression 2003Dx   Edema    Food allergy    citris   FOOT PAIN, RIGHT 11/22/2008   Qualifier: Diagnosis of  By: Barbaraann Barthel MD, [place]     Gastric ulcer    GERD (gastroesophageal reflux disease)    Gout    Hypertension    Joint pain    KNEE INJURY, LEFT 01/01/2009   Qualifier: Diagnosis of  By: Delrae Alfred MD, [place]     Migraine    ONYCHOMYCOSIS, TOENAILS 11/22/2008   Qualifier: Diagnosis of  By: Barbaraann Barthel MD, [place]     Shortness of breath    Sleep apnea    TINEA PEDIS 11/22/2008   Qualifier: Diagnosis of  By: Barbaraann Barthel MD, Benetta Spar      PAST SURGICAL HISTORY: Past Surgical History:  Procedure Laterality Date   WISDOM TOOTH EXTRACTION  2008   x4    SOCIAL HISTORY: Social History  Substance Use Topics   Smoking status: Former Smoker    Packs/day: 0.50    Years: 18.00    Types: Cigarettes    Start date: 10/13/1996    Quit date: 01/12/2015   Smokeless tobacco: Never Used   Alcohol use 0.6 oz/week    1 Standard drinks or equivalent per week     Comment: rarely drinks    FAMILY HISTORY: Family History  Problem Relation Age of Onset   Hypertension Mother    Hyperlipidemia Mother    Cancer Mother        bone cancer   Heart disease Mother    Sudden death Mother    Stroke Mother    Thyroid disease Mother    Sleep apnea Mother    Obesity Mother    Sudden death Father    Hypertension Father    Heart disease Father    Hyperlipidemia Father    Prostate cancer Father    Lung cancer Father    Obesity Father    Alcoholism Father    Sudden death Brother    Hypertension Brother    Hyperlipidemia Brother    Heart attack Brother    Heart disease Brother    Stroke Brother    Alcoholism Brother    Drug abuse Brother     Heart disease Sister    Hypertension Sister    Thyroid cancer Sister    Diabetes Neg Hx    Colon cancer Neg Hx    Stomach cancer Neg Hx  Esophageal cancer Neg Hx    Rectal cancer Neg Hx    Liver cancer Neg Hx     ROS: Review of Systems  Constitutional: Positive for weight loss.  Gastrointestinal: Negative for nausea and vomiting.  Musculoskeletal:       Negative muscle weakness  Psychiatric/Behavioral: Positive for depression. Negative for suicidal ideas.    PHYSICAL EXAM: Blood pressure 110/76, pulse 90, temperature 97.8 F (36.6 C), temperature source Oral, height 5\' 7"  (1.702 m), weight (!) 439 lb (199.1 kg), SpO2 98 %. Body mass index is 68.76 kg/m. Physical Exam  Constitutional: She is oriented to person, place, and time. She appears well-developed and well-nourished.  Cardiovascular: Normal rate.   Pulmonary/Chest: Effort normal.  Musculoskeletal: Normal range of motion.  Neurological: She is oriented to person, place, and time.  Skin: Skin is warm and dry.  Psychiatric: She has a normal mood and affect. Her behavior is normal.  Vitals reviewed.   RECENT LABS AND TESTS: BMET    Component Value Date/Time   NA 140 01/20/2017 0831   K 4.4 01/20/2017 0831   CL 102 01/20/2017 0831   CO2 23 01/20/2017 0831   GLUCOSE 80 01/20/2017 0831   GLUCOSE 83 06/04/2016 1155   BUN 16 01/20/2017 0831   CREATININE 0.74 01/20/2017 0831   CREATININE 0.90 06/04/2016 1155   CALCIUM 9.3 01/20/2017 0831   GFRNONAA 105 01/20/2017 0831   GFRNONAA 84 06/04/2016 1155   GFRAA 121 01/20/2017 0831   GFRAA >89 06/04/2016 1155   Lab Results  Component Value Date   HGBA1C 5.4 01/20/2017   HGBA1C 5.6 10/27/2016   HGBA1C 5.0 11/21/2015   Lab Results  Component Value Date   INSULIN 29.7 (H) 01/20/2017   INSULIN 30.1 (H) 10/27/2016   CBC    Component Value Date/Time   WBC 9.3 01/20/2017 0831   WBC 13.4 (H) 06/04/2016 1155   RBC 4.61 01/20/2017 0831   RBC 4.37  06/04/2016 1155   HGB 12.1 06/04/2016 1155   HCT 39.5 01/20/2017 0831   PLT 290 06/04/2016 1155   MCV 86 01/20/2017 0831   MCH 27.3 01/20/2017 0831   MCH 27.7 06/04/2016 1155   MCHC 31.9 01/20/2017 0831   MCHC 32.4 06/04/2016 1155   RDW 15.3 01/20/2017 0831   LYMPHSABS 2.9 01/20/2017 0831   MONOABS 1.3 (H) 09/21/2013 1635   EOSABS 0.1 01/20/2017 0831   BASOSABS 0.0 01/20/2017 0831   Iron/TIBC/Ferritin/ %Sat No results found for: IRON, TIBC, FERRITIN, IRONPCTSAT Lipid Panel     Component Value Date/Time   CHOL 152 10/27/2016 1058   TRIG 57 10/27/2016 1058   HDL 43 10/27/2016 1058   CHOLHDL 3.6 11/21/2015 1047   VLDL 10 11/21/2015 1047   LDLCALC 98 10/27/2016 1058   Hepatic Function Panel     Component Value Date/Time   PROT 6.6 01/20/2017 0831   ALBUMIN 3.9 01/20/2017 0831   AST 21 01/20/2017 0831   ALT 19 01/20/2017 0831   ALKPHOS 65 01/20/2017 0831   BILITOT 0.3 01/20/2017 0831      Component Value Date/Time   TSH 1.460 10/27/2016 1058   TSH 0.96 11/21/2015 1047    ASSESSMENT AND PLAN: Vitamin D deficiency  Other depression - Plan: buPROPion (WELLBUTRIN SR) 200 MG 12 hr tablet  Morbid obesity (HCC)  PLAN:  Vitamin D Deficiency Catherine [name] was informed that low vitamin D levels contributes to fatigue and are associated with obesity, breast, and colon cancer. She agrees to continue to take prescription  Vit D @50 ,000 IU every week and will follow up for routine testing of vitamin D, at least 2-3 times per year. She was informed of the risk of over-replacement of vitamin D and agrees to not increase her dose unless he discusses this with [name] first.  Depression with Emotional Eating Behaviors We discussed behavior modification techniques today to help Asencion deal with her emotional eating and depression. She has agreed to continue to take Wellbutrin SR 200 mg qd, we will refill for 1 month and she agreed to follow up as directed.  Obesity Giuliana is currently in the  action stage of change. As such, her goal is to continue with weight loss efforts She has agreed to follow the Category 3 plan Dorethea has been instructed to work up to a goal of 150 minutes of combined cardio and strengthening exercise per week for weight loss and overall health benefits. We discussed the following Behavioral Modification Strategies today: increase H2O intake, increasing lean protein intake and holiday eating strategies   Ikeya has agreed to follow up with our clinic in 2 weeks. She was informed of the importance of frequent follow up visits to maximize her success with intensive lifestyle modifications for her multiple health conditions.  I, [place] Crane, am acting as scribe for Quillian Quince, MD  I have reviewed the above documentation for accuracy and completeness, and I agree with the above. -Quillian Quince, MD  OBESITY BEHAVIORAL INTERVENTION VISIT  Today's visit was # 10 out of 22.  Starting weight: 468 lbs Starting date: 10/27/16 Today's weight : 439 lbs Today's date: 03/17/2017 Total lbs lost to date: 29 (Patients must lose 7 lbs in the first 6 months to continue with counseling)   ASK: We discussed the diagnosis of obesity with [name] today and Reha agreed to give [name] permission to discuss obesity behavioral modification therapy today.  ASSESS: Mandeep has the diagnosis of obesity and her BMI today is 68.9 Amayia is in the action stage of change   ADVISE: Shamara was educated on the multiple health risks of obesity as well as the benefit of weight loss to improve her health. She was advised of the need for long term treatment and the importance of lifestyle modifications.  AGREE: Multiple dietary modification options and treatment options were discussed and  Aasiyah agreed to follow the Category 3 plan We discussed the following Behavioral Modification Strategies today: increase H2O intake, increasing lean protein intake and holiday eating  strategies

## 2017-03-24 ENCOUNTER — Ambulatory Visit (INDEPENDENT_AMBULATORY_CARE_PROVIDER_SITE_OTHER): Payer: 59 | Admitting: Internal Medicine

## 2017-03-24 ENCOUNTER — Encounter: Payer: Self-pay | Admitting: Internal Medicine

## 2017-03-24 ENCOUNTER — Ambulatory Visit: Payer: 59 | Admitting: Internal Medicine

## 2017-03-24 VITALS — BP 128/76 | HR 84 | Temp 97.9°F | Ht 67.0 in | Wt >= 6400 oz

## 2017-03-24 DIAGNOSIS — M5410 Radiculopathy, site unspecified: Secondary | ICD-10-CM

## 2017-03-24 MED ORDER — DICLOFENAC SODIUM 1 % TD GEL: 2.0000 g | Freq: Four times a day (QID) | TRANSDERMAL | 1 refills | Status: DC

## 2017-03-24 MED ORDER — TRAMADOL HCL 50 MG PO TABS: 50.0000 mg | ORAL_TABLET | Freq: Three times a day (TID) | ORAL | 1 refills | Status: DC | PRN

## 2017-03-24 MED FILL — traMADol HCL 50 MG TABS: 50 | 10 days supply | Qty: 30 | Fill #0

## 2017-03-24 MED FILL — DICLOFENAC SODIUM 1% GEL: 1 | 12 days supply | Qty: 100 | Fill #0

## 2017-03-24 NOTE — Patient Instructions (Signed)
Increase your Gabapentin to 300 mg three times per day. You may want to start by increasing nighttime dose as sometimes it can make people sleepy. After one week, you can increase to 600 mg three times per day. Please stop taking the Ibuprofen.   I will call you with your MRI results.

## 2017-03-24 NOTE — Progress Notes (Signed)
   Subjective:    Catherine [name] - 36 y.o. female MRN [id number]  Date of birth: [birth year]  HPI  Catherine [name] is here for follow up of sacral radiculopathy. Seen for pain radiating down right leg without significant back pain 02/04/17. Was prescribed gabapentin and OTC analgesia for pain. Stretching back exercises were provided. Today, patient reports that numbness has remained the same. Numbness is located on the lateral aspect of her right lower extremity. Pain starts in the hip region and radiates down her leg. She is having trouble walking first thing in the morning due to pain. She has started using the assistance of a cane over the past couple of weeks to help with ambulation. Denies saddle anesthesia and bowel/bladder incontinence.    -  reports that she quit smoking about 2 years ago. Her smoking use included Cigarettes. She started smoking about 20 years ago. She has a 9.00 pack-year smoking history. She has never used smokeless tobacco. - Review of Systems: Per HPI. - Past Medical History: Patient Active Problem List   Diagnosis Date Noted  . Sacral radiculopathy 02/04/2017  . Insulin resistance 02/02/2017  . At risk for violence to self related to depression 02/02/2017  . Morbid obesity (HCC) 01/01/2017  . Sore throat (viral) 11/21/2016  . Vitamin D deficiency 11/12/2016  . Prediabetes 11/12/2016  . Asthma 07/25/2016  . Abdominal pain 06/04/2016  . Otitis, externa, infective 06/04/2016  . Dysuria 03/20/2016  . Gout 11/21/2015  . Obesity, morbid, BMI 50 or higher (HCC) 05/14/2015  . BIPOLAR DISORDER UNSPECIFIED 11/22/2008  . Depression 12/10/2006  . MIGRAINE, UNSPEC., W/O INTRACTABLE MIGRAINE 12/10/2006  . GASTROESOPHAGEAL REFLUX, NO ESOPHAGITIS 12/10/2006  . OSA (obstructive sleep apnea) 12/10/2006   - Medications: reviewed and updated   Objective:   Physical Exam BP 128/76   Pulse 84   Temp 97.9 F (36.6 C) (Oral)   Ht 5\' 7"  (1.702 m)   Wt (!) 448 lb (203.2  kg)   LMP 03/17/2017   SpO2 94%   BMI 70.17 kg/m  Gen: NAD, alert, cooperative with exam CV: 2+ pedal pulses  MSK: No pain to palpation of lumbar spine. Pain with flexion at back.  Neuro: Antalgic gait ambulating with assistance of cane. Strength 5/5 in lower extremities. Gross sensation diminished to the lateral lower right extremity. Negative straight leg test.     Assessment & Plan:   1. Radicular pain of right lower extremity Given that pain has persisted and patient is having more difficulty ambulating due to pain, warrants imaging of lumbar spine to evaluate for compression of nerve root. No red flags on history.  - MR Lumbar Spine W Wo Contrast; Future - traMADol (ULTRAM) 50 MG tablet; Take 1 tablet (50 mg total) by mouth every 8 (eight) hours as needed.  Dispense: 30 tablet; Refill: 1 - diclofenac sodium (VOLTAREN) 1 % GEL; Apply 2 g topically 4 (four) times daily.  Dispense: 100 g; Refill: 1; patient requested Voltaren gel refill as this has helped relieve other pain in the past  -instructions provided on how to increase Gabapentin as patient was only taking 100 mg TID  -encouraged d/c of Ibuprofen as patient has been taking a significant amount; elected to prescribe Tramadol instead  -return precautions discussed     [name], D.O. 03/25/2017, 8:42 AM PGY-2, [facility]  Precepted with Dr. Lum BabeEniola

## 2017-03-26 DIAGNOSIS — G4733 Obstructive sleep apnea (adult) (pediatric): Secondary | ICD-10-CM | POA: Diagnosis not present

## 2017-03-30 ENCOUNTER — Other Ambulatory Visit: Payer: Self-pay | Admitting: Internal Medicine

## 2017-03-30 NOTE — Telephone Encounter (Signed)
Pt is calling because her Gabapentin was increased. She is now taking 600 mg a day so she is out of her medication since the old prescription was 300 mg. Can we call in a new prescription with her dosage since she can not get a refill on the old one since it is to early. Pt is also checking status of her FMLA papers that were faxed. jw

## 2017-03-31 MED ORDER — GABAPENTIN 300 MG PO CAPS: 600.0000 mg | ORAL_CAPSULE | Freq: Three times a day (TID) | ORAL | 3 refills | Status: DC

## 2017-03-31 MED FILL — GABAPENTIN 300 MG CAPSULE: 300 | 30 days supply | Qty: 180 | Fill #0

## 2017-03-31 NOTE — Telephone Encounter (Signed)
Pt informed. Catherine [name] T Catherine [name], CMA  

## 2017-03-31 NOTE — Telephone Encounter (Signed)
Have sent in Gabapentin 300 mg capsules. She can take one of these three times per day and increase to two capsules three times a day if needed. I placed FMLA paperwork in to be fax pile yesterday.   Marcy Sirenatherine Shetara Launer, D.O. 03/31/2017, 5:17 PM PGY-2,  Family Medicine

## 2017-04-02 ENCOUNTER — Ambulatory Visit (INDEPENDENT_AMBULATORY_CARE_PROVIDER_SITE_OTHER): Payer: 59 | Admitting: Family Medicine

## 2017-04-02 VITALS — BP 134/84 | HR 98 | Temp 98.7°F | Ht 67.0 in | Wt >= 6400 oz

## 2017-04-02 DIAGNOSIS — F3289 Other specified depressive episodes: Secondary | ICD-10-CM

## 2017-04-02 DIAGNOSIS — E559 Vitamin D deficiency, unspecified: Secondary | ICD-10-CM | POA: Diagnosis not present

## 2017-04-02 MED ORDER — BUPROPION HCL ER (SR) 200 MG PO TB12: 200.0000 mg | ORAL_TABLET | Freq: Every morning | ORAL | 0 refills | Status: DC

## 2017-04-02 MED ORDER — VITAMIN D (ERGOCALCIFEROL) 1.25 MG (50000 UNIT) PO CAPS: 50000.0000 [IU] | ORAL_CAPSULE | ORAL | 0 refills | Status: DC

## 2017-04-02 MED FILL — VIT D2 1.25 MG (50,000 UNIT: 1.25 MG | 28 days supply | Qty: 4 | Fill #0

## 2017-04-02 NOTE — Progress Notes (Signed)
Office: [phone]  /  Fax: [phone]   HPI:   Chief Complaint: OBESITY Catherine [name] is here to discuss her progress with her obesity treatment [name]. She is on the  follow the Category 3 [name] and is following her eating [name] approximately 98 % of the time. She states she is exercising 0 minutes 0 times per week. Catherine [name] continues to do well with weight loss. Hunger is controlled. She has no excessive cravings. With warmer weather she is having a hard time eating all the protein. She states she feels fluid on knees has improved greatly. Her weight is (!) 432 lb (196 kg) today and has had a weight loss of 7 pounds over a period of 2 weeks since her last visit. She has lost 36 lbs since starting treatment with [name].  Vitamin D deficiency Catherine [name] has a diagnosis of vitamin D deficiency. She is currently taking vit D and denies nausea, vomiting or muscle weakness.  [name] with emotional eating behaviors Catherine [name] is struggling with emotional eating and using food for comfort to the extent that it is negatively impacting her health. She often snacks when she is not hungry. Catherine [name] sometimes feels she is out of control and then feels guilty that she made poor food choices. She has been working on behavior modification techniques to help reduce her emotional eating and has been somewhat successful. She shows no sign of suicidal or homicidal ideations.  [name] screen [facility] 2/9 03/24/2017 02/04/2017 11/21/2016 10/27/2016 09/08/2016  Decreased Interest 0 0 0 3 0  Down, Depressed, Hopeless 0 0 0 3 0  PHQ - 2 Score 0 0 0 6 0  Altered sleeping - - - 2 -  Tired, decreased energy - - - 2 -  [name] in appetite - - - 3 -  Feeling bad or failure about yourself  - - - 2 -  Trouble concentrating - - - 3 -  Moving slowly or fidgety/restless - - - 3 -  Suicidal thoughts - - - 2 -  PHQ-9 Score - - - 23 -  Some encounter information is confidential and restricted. Go to Review Flowsheets activity to see all  data.      ALLERGIES: Allergies  Allergen Reactions  . Citrus Swelling    MEDICATIONS: Current Outpatient Prescriptions on File Prior to Visit  Medication Sig Dispense Refill  . allopurinol (ZYLOPRIM) 100 MG tablet TAKE 1 TABLET BY MOUTH DAILY. 30 tablet 2  . aspirin-acetaminophen-caffeine (EXCEDRIN MIGRAINE) 250-250-65 MG per tablet Take 1 tablet by mouth 2 (two) times daily as needed for migraine. For headache    . baclofen (LIORESAL) 10 MG tablet Take 1 tablet (10 mg total) by mouth 3 (three) times daily as needed for muscle spasms. 30 each 0  . benzonatate (TESSALON) 100 MG capsule Take 1 capsule (100 mg total) by mouth 2 (two) times daily as needed for cough. 20 capsule 0  . budesonide (PULMICORT) 180 MCG/ACT inhaler Inhale 1 puff into the lungs 2 (two) times daily. 1 Inhaler 1  . cetirizine (ZYRTEC) 10 MG tablet Take 1 tablet (10 mg total) by mouth daily. 30 tablet 2  . diclofenac sodium (VOLTAREN) 1 % GEL Apply 2 g topically 4 (four) times daily. 100 g 1  . furosemide (LASIX) 20 MG tablet Take 1 tablet (20 mg total) by mouth daily. 30 tablet 3  . gabapentin (NEURONTIN) 300 MG capsule Take 2 capsules (600 mg total) by mouth 3 (three) times daily. 180 capsule 3  . ibuprofen (  ADVIL,MOTRIN) 200 MG tablet Take 200 mg by mouth every 6 (six) hours as needed for moderate pain.    Catherine [name] Kitchen ondansetron (ZOFRAN ODT) 8 MG disintegrating tablet Take 1 tablet (8 mg total) by mouth every 8 (eight) hours as needed for nausea or vomiting. 20 tablet 0  . potassium chloride (K-DUR) 10 MEQ tablet Take 1 tablet (10 mEq total) by mouth daily. 30 tablet 5  . ranitidine (ZANTAC) 150 MG tablet TAKE 1 TABLET BY MOUTH 2 TIMES DAILY. 60 tablet 0  . traMADol (ULTRAM) 50 MG tablet Take 1 tablet (50 mg total) by mouth every 8 (eight) hours as needed. 30 tablet 1   No current facility-administered medications on file prior to visit.     PAST MEDICAL HISTORY: Past Medical History:  Diagnosis Date  . Allergy   .  Anxiety   . Arthritis   . Asthma    since baby  . ASTHMA, UNSPECIFIED 12/10/2006   Qualifier: Diagnosis of  By: Abundio Miu    . CELLULITIS AND ABSCESS OF LEG EXCEPT FOOT 12/30/2007   Qualifier: Diagnosis of  By: Daphine Deutscher FNP, Zena Amos    . DEPENDENT EDEMA, LEGS, BILATERAL 11/22/2008   Qualifier: Diagnosis of  By: Barbaraann Barthel MD, [place]    . [name] 2003Dx  . Edema   . Food allergy    citris  . FOOT PAIN, RIGHT 11/22/2008   Qualifier: Diagnosis of  By: Barbaraann Barthel MD, [place]    . Gastric ulcer   . GERD (gastroesophageal reflux disease)   . Gout   . Hypertension   . Joint pain   . KNEE INJURY, LEFT 01/01/2009   Qualifier: Diagnosis of  By: Delrae Alfred MD, Lanora Manis    . Migraine   . ONYCHOMYCOSIS, TOENAILS 11/22/2008   Qualifier: Diagnosis of  By: Barbaraann Barthel MD, [place]    . Shortness of breath   . Sleep apnea   . TINEA PEDIS 11/22/2008   Qualifier: Diagnosis of  By: Barbaraann Barthel MD, Benetta Spar      PAST SURGICAL HISTORY: Past Surgical History:  Procedure Laterality Date  . WISDOM TOOTH EXTRACTION  2008   x4    SOCIAL HISTORY: Social History  Substance Use Topics  . Smoking status: Former Smoker    Packs/day: 0.50    Years: 18.00    Types: Cigarettes    Start date: 10/13/1996    Quit date: 01/12/2015  . Smokeless tobacco: Never Used  . Alcohol use 0.6 oz/week    1 Standard drinks or equivalent per week     Comment: rarely drinks    FAMILY HISTORY: Family History  Problem Relation Age of Onset  . Hypertension Mother   . Hyperlipidemia Mother   . Cancer Mother        bone cancer  . Heart disease Mother   . Sudden death Mother   . Stroke Mother   . Thyroid disease Mother   . Sleep apnea Mother   . Obesity Mother   . Sudden death Father   . Hypertension Father   . Heart disease Father   . Hyperlipidemia Father   . Prostate cancer Father   . Lung cancer Father   . Obesity Father   . Alcoholism Father   . Sudden death Brother   . Hypertension Brother   .  Hyperlipidemia Brother   . Heart attack Brother   . Heart disease Brother   . Stroke Brother   . Alcoholism Brother   . Drug abuse Brother   . Heart disease Sister   .  Hypertension Sister   . Thyroid cancer Sister   . Diabetes Neg Hx   . Colon cancer Neg Hx   . Stomach cancer Neg Hx   . Esophageal cancer Neg Hx   . Rectal cancer Neg Hx   . Liver cancer Neg Hx     ROS: Review of Systems  Constitutional: Negative for weight loss.  Gastrointestinal: Negative for nausea and vomiting.  Musculoskeletal:       Negative muscle weakness  Psychiatric/Behavioral: Positive for [name]. Negative for suicidal ideas.    PHYSICAL EXAM: Blood pressure 134/84, pulse 98, temperature 98.7 F (37.1 C), temperature source Oral, height 5\' 7"  (1.702 m), weight (!) 432 lb (196 kg), last menstrual period 03/17/2017, SpO2 94 %. Body mass index is 67.66 kg/m. Physical Exam  Constitutional: She is oriented to person, place, and time. She appears well-developed and well-nourished.  Cardiovascular: Normal rate.   Pulmonary/Chest: Effort normal.  Musculoskeletal: Normal range of motion.  Neurological: She is oriented to person, place, and time.  Skin: Skin is warm and dry.  Psychiatric: She has a normal mood and affect. Her behavior is normal.  Vitals reviewed.   RECENT LABS AND TESTS: BMET    Component Value Date/Time   NA 140 01/20/2017 0831   K 4.4 01/20/2017 0831   CL 102 01/20/2017 0831   CO2 23 01/20/2017 0831   GLUCOSE 80 01/20/2017 0831   GLUCOSE 83 06/04/2016 1155   BUN 16 01/20/2017 0831   CREATININE 0.74 01/20/2017 0831   CREATININE 0.90 06/04/2016 1155   CALCIUM 9.3 01/20/2017 0831   GFRNONAA 105 01/20/2017 0831   GFRNONAA 84 06/04/2016 1155   GFRAA 121 01/20/2017 0831   GFRAA >89 06/04/2016 1155   Lab Results  Component Value Date   HGBA1C 5.4 01/20/2017   HGBA1C 5.6 10/27/2016   HGBA1C 5.0 11/21/2015   Lab Results  Component Value Date   INSULIN 29.7 (H)  01/20/2017   INSULIN 30.1 (H) 10/27/2016   CBC    Component Value Date/Time   WBC 9.3 01/20/2017 0831   WBC 13.4 (H) 06/04/2016 1155   RBC 4.61 01/20/2017 0831   RBC 4.37 06/04/2016 1155   HGB 12.6 01/20/2017 0831   HCT 39.5 01/20/2017 0831   PLT 290 06/04/2016 1155   MCV 86 01/20/2017 0831   MCH 27.3 01/20/2017 0831   MCH 27.7 06/04/2016 1155   MCHC 31.9 01/20/2017 0831   MCHC 32.4 06/04/2016 1155   RDW 15.3 01/20/2017 0831   LYMPHSABS 2.9 01/20/2017 0831   MONOABS 1.3 (H) 09/21/2013 1635   EOSABS 0.1 01/20/2017 0831   BASOSABS 0.0 01/20/2017 0831   Iron/TIBC/Ferritin/ %Sat No results found for: IRON, TIBC, FERRITIN, IRONPCTSAT Lipid Panel     Component Value Date/Time   CHOL 152 10/27/2016 1058   TRIG 57 10/27/2016 1058   HDL 43 10/27/2016 1058   CHOLHDL 3.6 11/21/2015 1047   VLDL 10 11/21/2015 1047   LDLCALC 98 10/27/2016 1058   Hepatic Function Panel     Component Value Date/Time   PROT 6.6 01/20/2017 0831   ALBUMIN 3.9 01/20/2017 0831   AST 21 01/20/2017 0831   ALT 19 01/20/2017 0831   ALKPHOS 65 01/20/2017 0831   BILITOT 0.3 01/20/2017 0831      Component Value Date/Time   TSH 1.460 10/27/2016 1058   TSH 0.96 11/21/2015 1047    ASSESSMENT AND [name]: Vitamin D deficiency - [name]: Vitamin D, Ergocalciferol, (DRISDOL) 50000 units CAPS capsule  Other [name] - [name]:  buPROPion (WELLBUTRIN SR) 200 MG 12 hr tablet  Morbid obesity (HCC) - BMI 67.8  [name]:  Vitamin D Deficiency Catherine [name] was informed that low vitamin D levels contributes to fatigue and are associated with obesity, breast, and colon cancer. She agrees to continue to take prescription Vit D @50 ,000 IU every week, we will refill for 1 month and will follow up for routine testing of vitamin D, at least 2-3 times per year. She was informed of the risk of over-replacement of vitamin D and agrees to not increase her dose unless he discusses this with [name] first. Catherine [name] agrees to follow up with our  clinic in 3 weeks.  [name] with Emotional Eating Behaviors We discussed behavior modification techniques today to help Catherine [name]. She has agreed to take Wellbutrin SR 200 mg qd, we will refill for 1 month and she agreed to follow up as directed.  Obesity Catherine [name]. As such, her goal is to continue with weight loss efforts She has agreed to follow the Catherine [name] Catherine [name] to work up to a goal of 150 minutes of combined cardio and strengthening exercise per week for weight loss and overall health benefits. We discussed the following Behavioral Modification Strategies today: increasing lean protein intake  Catherine [name] has agreed to follow up with our clinic in 3 weeks. She was informed of the importance of frequent follow up visits to maximize her success with intensive lifestyle modifications for her multiple health conditions.  I, [place] Crane, am acting as transcriptionist for Quillian Quince, MD  I have reviewed the above documentation for accuracy and completeness, and I agree with the above. -Quillian Quince, MD  OBESITY BEHAVIORAL INTERVENTION VISIT  Today's visit was # 11 out of 22.  Starting weight: 468 lbs Starting date: 10/27/16 Today's weight : 432 lbs Today's date: 04/02/2017 Total lbs lost to date: 51 (Patients must lose 7 lbs in the first 6 months to continue with counseling)   ASK: We discussed the diagnosis of obesity with Catherine [name] today and Catherine [name] agreed to give [name] permission to discuss obesity behavioral modification therapy today.  ASSESS: Catherine [name] has the diagnosis of obesity and her BMI today is 24.8 Catherine [name] is in the action stage of [name]   ADVISE: Catherine [name] was educated on the multiple health risks of obesity as well as the benefit of weight loss to improve her health. She was advised of the need for long term treatment and the importance of  lifestyle modifications.  AGREE: Multiple dietary modification options and treatment options were discussed and  Catherine [name] agreed to follow the Catherine [name] We discussed the following Behavioral Modification Strategies today: increasing lean protein intake

## 2017-04-06 ENCOUNTER — Other Ambulatory Visit: Payer: Self-pay | Admitting: Internal Medicine

## 2017-04-06 MED FILL — traMADol HCL 50 MG TABS: 50 | 10 days supply | Qty: 30 | Fill #1

## 2017-04-06 MED FILL — FUROSEMIDE 20 MG TABLET: 20 | 30 days supply | Qty: 30 | Fill #0

## 2017-04-08 ENCOUNTER — Ambulatory Visit
Admission: RE | Admit: 2017-04-08 | Discharge: 2017-04-08 | Disposition: A | Discharge status: Home / Self Care | Payer: 59 | Source: Ambulatory Visit | Attending: Family Medicine | Admitting: Family Medicine

## 2017-04-08 DIAGNOSIS — R531 Weakness: Secondary | ICD-10-CM | POA: Diagnosis not present

## 2017-04-08 DIAGNOSIS — M5410 Radiculopathy, site unspecified: Secondary | ICD-10-CM

## 2017-04-08 MED ORDER — GADOBENATE DIMEGLUMINE 529 MG/ML IV SOLN
20.0000 mL | Freq: Once | INTRAVENOUS | Status: AC | PRN
  Administered 2017-04-08: 20 mL via INTRAVENOUS

## 2017-04-10 ENCOUNTER — Telehealth: Payer: Self-pay | Admitting: Internal Medicine

## 2017-04-10 DIAGNOSIS — M5410 Radiculopathy, site unspecified: Secondary | ICD-10-CM

## 2017-04-10 NOTE — Telephone Encounter (Signed)
Pt would like someone to call her with MRI results. ep

## 2017-04-10 NOTE — Telephone Encounter (Signed)
Will forward to MD to advise. Camille Dragan,CMA  

## 2017-04-10 NOTE — Telephone Encounter (Signed)
Called patient to discuss MRI results. Discussed that compression of S1 nerve root on right side by synovial cyst may be contributing to her pain. Have placed referral to neurosurgery for review of imaging, further evaluation and management.   Marcy Sirenatherine Yanet Balliet, D.O. 04/10/2017, 12:24 PM PGY-2, [facility]

## 2017-04-13 ENCOUNTER — Ambulatory Visit (INDEPENDENT_AMBULATORY_CARE_PROVIDER_SITE_OTHER): Payer: 59 | Admitting: Internal Medicine

## 2017-04-13 ENCOUNTER — Ambulatory Visit: Payer: 59 | Admitting: Family Medicine

## 2017-04-13 ENCOUNTER — Encounter: Payer: Self-pay | Admitting: Internal Medicine

## 2017-04-13 VITALS — BP 130/84 | HR 70 | Temp 97.9°F | Ht 67.0 in | Wt >= 6400 oz

## 2017-04-13 DIAGNOSIS — N939 Abnormal uterine and vaginal bleeding, unspecified: Secondary | ICD-10-CM

## 2017-04-13 MED ORDER — NORGESTIMATE-ETH ESTRADIOL 0.25-35 MG-MCG PO TABS: 1.0000 | ORAL_TABLET | Freq: Every day | ORAL | 11 refills | Status: DC

## 2017-04-13 MED ORDER — MEDROXYPROGESTERONE ACETATE 10 MG PO TABS: 10.0000 mg | ORAL_TABLET | Freq: Every day | ORAL | 0 refills | Status: DC

## 2017-04-13 MED FILL — MONO-LINYAH 28 TABLET: 0.25-35 | 28 days supply | Qty: 28 | Fill #0

## 2017-04-13 MED FILL — MEDROXYPROGESTERONE 10 MG T: 10 | 10 days supply | Qty: 10 | Fill #0

## 2017-04-13 NOTE — Patient Instructions (Signed)
Take the Provera for ten days in a row and then start the birth control pills. Call me if you decide you want to do the ultrasound and please update me as to how you're doing.   Take Care,   Dr. Earlene PlaterWallace

## 2017-04-13 NOTE — Progress Notes (Signed)
Subjective:    Catherine [name] - 36 y.o. female MRN [id number]  Date of birth: 1981/01/30  HPI  Catherine [name] is here for abnormal uterine bleeding.  AUB: Has been having bleeding for 2.5-3 months. Is lighter during the day but becomes heavier at nighttime. Using multiple pads throughout the day. Last normal menses was at the end of February. Prior to February, was having normal menses monthly. Had similar issues in 2007-2008 with bleeding for about 4 months after not having menstrual period for 4 years. Took medication hormonal medication for about a week and bleeding subsequently stopped. At that time, had a reportedly normal endometrial biopsy and transvaginal ultrasound. No further episodes of heavy bleeding until now. Currently, no vaginal discharge or concern for STD. No chance that she could be pregnant. She is monogamous with her wife. Not using any type of birth control. Never had C-section or abdominal surgery. Never been pregnant. Denies lightheadedness, near syncope, chest pain and SOB.   -  reports that she quit smoking about 2 years ago. Her smoking use included Cigarettes. She started smoking about 20 years ago. She has a 9.00 pack-year smoking history. She has never used smokeless tobacco. - Review of Systems: Per HPI. - Past Medical History: Patient Active Problem List   Diagnosis Date Noted  . Sacral radiculopathy 02/04/2017  . Insulin resistance 02/02/2017  . At risk for violence to self related to depression 02/02/2017  . Morbid obesity (HCC) 01/01/2017  . Sore throat (viral) 11/21/2016  . Vitamin D deficiency 11/12/2016  . Prediabetes 11/12/2016  . Asthma 07/25/2016  . Abdominal pain 06/04/2016  . Otitis, externa, infective 06/04/2016  . Dysuria 03/20/2016  . Gout 11/21/2015  . Obesity, morbid, BMI 50 or higher (HCC) 05/14/2015  . BIPOLAR DISORDER UNSPECIFIED 11/22/2008  . Depression 12/10/2006  . MIGRAINE, UNSPEC., W/O INTRACTABLE MIGRAINE 12/10/2006  .  GASTROESOPHAGEAL REFLUX, NO ESOPHAGITIS 12/10/2006  . OSA (obstructive sleep apnea) 12/10/2006   - Medications: reviewed and updated   Objective:   Physical Exam BP 130/84   Pulse 70   Temp 97.9 F (36.6 C) (Oral)   Ht 5\' 7"  (1.702 m)   Wt (!) 439 lb 12.8 oz (199.5 kg)   LMP 03/17/2017   SpO2 98%   BMI 68.88 kg/m  Gen: NAD, alert, cooperative with exam, well-appearing GU/GYN: Exam performed in the presence of a chaperone. External genitalia within normal limits.  Vaginal mucosa pink, moist, normal rugae and without lesions.  Nonfriable cervix without lesions and  no discharge noted on speculum exam. Moderate amount of bright red blood in vaginal vault and on external genitalia. Small blood clots in vagina noted. Bleeding appears to be coming from cervical os.  Bimanual exam revealed normal, nongravid uterus.  No cervical motion tenderness. No adnexal masses bilaterally.    Assessment & Plan:   1. Abnormal uterine bleeding Suspect hormonal cause of abnormal uterine bleeding. Patient with active bleeding present on exam. Will check CBC and discussed with patient that she may need to start Fe supplement based on results. Will do ten day course of Progesterone followed by initiation of OCPs. Discussed that pelvic US would be indicated if bleeding did not stop with hormone supplementation. However, gave the option of proceeding with sono now. Patient wants to consider and will get back to me. Precpeted with Dr. Clifton Custard and Dr. Gwendolyn Grant who helped develop this plan.  - CBC - medroxyPROGESTERone (PROVERA) 10 MG tablet; Take 1 tablet (10 mg total) by  mouth daily. For ten days.  Dispense: 10 tablet; Refill: 0 - norgestimate-ethinyl estradiol (ORTHO-CYCLEN,SPRINTEC,PREVIFEM) 0.25-35 MG-MCG tablet; Take 1 tablet by mouth daily.  Dispense: 1 Package; Refill: 11   Catherine [name], D.O. 04/13/2017, 1:47 PM PGY-2, [facility]

## 2017-04-14 ENCOUNTER — Telehealth: Payer: Self-pay | Admitting: *Deleted

## 2017-04-14 LAB — CBC
Hematocrit: 33.9 % — ABNORMAL LOW (ref 34.0–46.6)
Hemoglobin: 11.1 g/dL (ref 11.1–15.9)
MCH: 27.9 pg (ref 26.6–33.0)
MCHC: 32.7 g/dL (ref 31.5–35.7)
MCV: 85 fL (ref 79–97)
Platelets: 276 10*3/uL (ref 150–379)
RBC: 3.98 x10E6/uL (ref 3.77–5.28)
RDW: 15 % (ref 12.3–15.4)
WBC: 9 10*3/uL (ref 3.4–10.8)

## 2017-04-14 NOTE — Telephone Encounter (Signed)
Pt informed. Zimmerman Rumple, April D, CMA  

## 2017-04-14 NOTE — Telephone Encounter (Signed)
-----   Message from [name], DO sent at 04/14/2017 10:32 AM EDT ----- Please call patient to let her know that hemoglobin and blood counts are stable.   [name], D.O. 04/14/2017, 10:32 AM PGY-3, [facility]

## 2017-04-20 MED FILL — BUPROPION HCL SR 200 MG TAB: 200 | 30 days supply | Qty: 30 | Fill #0

## 2017-04-21 ENCOUNTER — Encounter: Payer: Self-pay | Admitting: Internal Medicine

## 2017-04-23 ENCOUNTER — Ambulatory Visit (INDEPENDENT_AMBULATORY_CARE_PROVIDER_SITE_OTHER): Payer: 59 | Admitting: Family Medicine

## 2017-04-23 VITALS — BP 108/71 | HR 96 | Temp 98.3°F | Ht 67.0 in | Wt >= 6400 oz

## 2017-04-23 DIAGNOSIS — IMO0001 Reserved for inherently not codable concepts without codable children: Secondary | ICD-10-CM

## 2017-04-23 DIAGNOSIS — Z6841 Body Mass Index (BMI) 40.0 and over, adult: Secondary | ICD-10-CM | POA: Diagnosis not present

## 2017-04-23 DIAGNOSIS — E559 Vitamin D deficiency, unspecified: Secondary | ICD-10-CM

## 2017-04-23 DIAGNOSIS — N921 Excessive and frequent menstruation with irregular cycle: Secondary | ICD-10-CM

## 2017-04-23 DIAGNOSIS — E669 Obesity, unspecified: Secondary | ICD-10-CM

## 2017-04-23 MED ORDER — VITAMIN D (ERGOCALCIFEROL) 1.25 MG (50000 UNIT) PO CAPS: 50000.0000 [IU] | ORAL_CAPSULE | ORAL | 0 refills | Status: DC

## 2017-04-23 NOTE — Progress Notes (Signed)
Office: [phone]  /  Fax: [phone]   HPI:   Chief Complaint: OBESITY Catherine [name] is here to discuss her progress with her obesity treatment plan. She is on the  follow the Category 3 plan and is following her eating plan approximately 90 % of the time. She states she is exercising 0 minutes 0 times per week. Catherine [name] is doing well with weight loss. She has had problems with menorrhagia and notes struggling with emotional eating, but managed this well. She was on progesterone for 2 weeks and still was able to lose weight. Her weight is (!) 426 lb (193.2 kg) today and has had a weight loss of 6 pounds over a period of 3 weeks since her last visit. She has lost 42 lbs since starting treatment with [name].  Vitamin D deficiency Catherine [name] has a diagnosis of vitamin D deficiency. She is currently stable on vit D, not yet at goal and denies nausea, vomiting or muscle weakness.  Menorrhagia Catherine [name] explained her recent history of menorrhagia and asks questions about how her fat cells are affecting her menses and uterine lining.   ALLERGIES: Allergies  Allergen Reactions   Citrus Swelling    MEDICATIONS: Current Outpatient Prescriptions on File Prior to Visit  Medication Sig Dispense Refill   allopurinol (ZYLOPRIM) 100 MG tablet TAKE 1 TABLET BY MOUTH DAILY. 30 tablet 2   aspirin-acetaminophen-caffeine (EXCEDRIN MIGRAINE) 250-250-65 MG per tablet Take 1 tablet by mouth 2 (two) times daily as needed for migraine. For headache     baclofen (LIORESAL) 10 MG tablet Take 1 tablet (10 mg total) by mouth 3 (three) times daily as needed for muscle spasms. 30 each 0   benzonatate (TESSALON) 100 MG capsule Take 1 capsule (100 mg total) by mouth 2 (two) times daily as needed for cough. 20 capsule 0   budesonide (PULMICORT) 180 MCG/ACT inhaler Inhale 1 puff into the lungs 2 (two) times daily. 1 Inhaler 1   buPROPion (WELLBUTRIN SR) 200 MG 12 hr tablet Take 1 tablet (200 mg total) by mouth every  morning. 30 tablet 0   cetirizine (ZYRTEC) 10 MG tablet Take 1 tablet (10 mg total) by mouth daily. 30 tablet 2   diclofenac sodium (VOLTAREN) 1 % GEL Apply 2 g topically 4 (four) times daily. 100 g 1   furosemide (LASIX) 20 MG tablet TAKE 1 TABLET (20 MG TOTAL) BY MOUTH DAILY. 30 tablet 1   gabapentin (NEURONTIN) 300 MG capsule Take 2 capsules (600 mg total) by mouth 3 (three) times daily. 180 capsule 3   ibuprofen (ADVIL,MOTRIN) 200 MG tablet Take 200 mg by mouth every 6 (six) hours as needed for moderate pain.     medroxyPROGESTERone (PROVERA) 10 MG tablet Take 1 tablet (10 mg total) by mouth daily. For ten days. 10 tablet 0   norgestimate-ethinyl estradiol (ORTHO-CYCLEN,SPRINTEC,PREVIFEM) 0.25-35 MG-MCG tablet Take 1 tablet by mouth daily. 1 Package 11   ondansetron (ZOFRAN ODT) 8 MG disintegrating tablet Take 1 tablet (8 mg total) by mouth every 8 (eight) hours as needed for nausea or vomiting. 20 tablet 0   potassium chloride (K-DUR) 10 MEQ tablet Take 1 tablet (10 mEq total) by mouth daily. 30 tablet 5   ranitidine (ZANTAC) 150 MG tablet TAKE 1 TABLET BY MOUTH 2 TIMES DAILY. 60 tablet 0   traMADol (ULTRAM) 50 MG tablet Take 1 tablet (50 mg total) by mouth every 8 (eight) hours as needed. 30 tablet 1   No current facility-administered medications on file prior to  visit.     PAST MEDICAL HISTORY: Past Medical History:  Diagnosis Date   Allergy    Anxiety    Arthritis    Asthma    since baby   ASTHMA, UNSPECIFIED 12/10/2006   Qualifier: Diagnosis of  By: Abundio MiuMcGregor, Barbara     CELLULITIS AND ABSCESS OF LEG EXCEPT FOOT 12/30/2007   Qualifier: Diagnosis of  By: Daphine DeutscherMartin FNP, [place]     DEPENDENT EDEMA, LEGS, BILATERAL 11/22/2008   Qualifier: Diagnosis of  By: Barbaraann Barthelankins MD, [place]     Depression 2003Dx   Edema    Food allergy    citris   FOOT PAIN, RIGHT 11/22/2008   Qualifier: Diagnosis of  By: Barbaraann Barthelankins MD, [place]     Gastric ulcer    GERD (gastroesophageal  reflux disease)    Gout    Hypertension    Joint pain    KNEE INJURY, LEFT 01/01/2009   Qualifier: Diagnosis of  By: Delrae AlfredMulberry MD, Elizabeth     Migraine    ONYCHOMYCOSIS, TOENAILS 11/22/2008   Qualifier: Diagnosis of  By: Barbaraann Barthelankins MD, [place]     Shortness of breath    Sleep apnea    TINEA PEDIS 11/22/2008   Qualifier: Diagnosis of  By: Barbaraann Barthelankins MD, Benetta SparVictoria      PAST SURGICAL HISTORY: Past Surgical History:  Procedure Laterality Date   WISDOM TOOTH EXTRACTION  2008   x4    SOCIAL HISTORY: Social History  Substance Use Topics   Smoking status: Former Smoker    Packs/day: 0.50    Years: 18.00    Types: Cigarettes    Start date: 10/13/1996    Quit date: 01/12/2015   Smokeless tobacco: Never Used   Alcohol use 0.6 oz/week    1 Standard drinks or equivalent per week     Comment: rarely drinks    FAMILY HISTORY: Family History  Problem Relation Age of Onset   Hypertension Mother    Hyperlipidemia Mother    Cancer Mother        bone cancer   Heart disease Mother    Sudden death Mother    Stroke Mother    Thyroid disease Mother    Sleep apnea Mother    Obesity Mother    Sudden death Father    Hypertension Father    Heart disease Father    Hyperlipidemia Father    Prostate cancer Father    Lung cancer Father    Obesity Father    Alcoholism Father    Sudden death Brother    Hypertension Brother    Hyperlipidemia Brother    Heart attack Brother    Heart disease Brother    Stroke Brother    Alcoholism Brother    Drug abuse Brother    Heart disease Sister    Hypertension Sister    Thyroid cancer Sister    Diabetes Neg Hx    Colon cancer Neg Hx    Stomach cancer Neg Hx    Esophageal cancer Neg Hx    Rectal cancer Neg Hx    Liver cancer Neg Hx     ROS: Review of Systems  Constitutional: Positive for weight loss.  Gastrointestinal: Negative for nausea and vomiting.  Musculoskeletal:       Negative muscle  weakness    PHYSICAL EXAM: Blood pressure 108/71, pulse 96, temperature 98.3 F (36.8 C), temperature source Oral, height 5\' 7"  (1.702 m), weight (!) 426 lb (193.2 kg), SpO2 98 %. Body mass index is 66.72 kg/m.  Physical Exam  Constitutional: She is oriented to person, place, and time. She appears well-developed and well-nourished.  Cardiovascular: Normal rate.   Pulmonary/Chest: Effort normal.  Musculoskeletal: Normal range of motion.  Neurological: She is oriented to person, place, and time.  Skin: Skin is warm and dry.  Psychiatric: She has a normal mood and affect. Her behavior is normal.  Vitals reviewed.   RECENT LABS AND TESTS: BMET    Component Value Date/Time   NA 140 01/20/2017 0831   K 4.4 01/20/2017 0831   CL 102 01/20/2017 0831   CO2 23 01/20/2017 0831   GLUCOSE 80 01/20/2017 0831   GLUCOSE 83 06/04/2016 1155   BUN 16 01/20/2017 0831   CREATININE 0.74 01/20/2017 0831   CREATININE 0.90 06/04/2016 1155   CALCIUM 9.3 01/20/2017 0831   GFRNONAA 105 01/20/2017 0831   GFRNONAA 84 06/04/2016 1155   GFRAA 121 01/20/2017 0831   GFRAA >89 06/04/2016 1155   Lab Results  Component Value Date   HGBA1C 5.4 01/20/2017   HGBA1C 5.6 10/27/2016   HGBA1C 5.0 11/21/2015   Lab Results  Component Value Date   INSULIN 29.7 (H) 01/20/2017   INSULIN 30.1 (H) 10/27/2016   CBC    Component Value Date/Time   WBC 9.0 04/13/2017 1242   WBC 13.4 (H) 06/04/2016 1155   RBC 3.98 04/13/2017 1242   RBC 4.37 06/04/2016 1155   HGB 11.1 04/13/2017 1242   HCT 33.9 (L) 04/13/2017 1242   PLT 276 04/13/2017 1242   MCV 85 04/13/2017 1242   MCH 27.9 04/13/2017 1242   MCH 27.7 06/04/2016 1155   MCHC 32.7 04/13/2017 1242   MCHC 32.4 06/04/2016 1155   RDW 15.0 04/13/2017 1242   LYMPHSABS 2.9 01/20/2017 0831   MONOABS 1.3 (H) 09/21/2013 1635   EOSABS 0.1 01/20/2017 0831   BASOSABS 0.0 01/20/2017 0831   Iron/TIBC/Ferritin/ %Sat No results found for: IRON, TIBC, FERRITIN,  IRONPCTSAT Lipid Panel     Component Value Date/Time   CHOL 152 10/27/2016 1058   TRIG 57 10/27/2016 1058   HDL 43 10/27/2016 1058   CHOLHDL 3.6 11/21/2015 1047   VLDL 10 11/21/2015 1047   LDLCALC 98 10/27/2016 1058   Hepatic Function Panel     Component Value Date/Time   PROT 6.6 01/20/2017 0831   ALBUMIN 3.9 01/20/2017 0831   AST 21 01/20/2017 0831   ALT 19 01/20/2017 0831   ALKPHOS 65 01/20/2017 0831   BILITOT 0.3 01/20/2017 0831      Component Value Date/Time   TSH 1.460 10/27/2016 1058   TSH 0.96 11/21/2015 1047    ASSESSMENT AND PLAN: Vitamin D deficiency - Plan: Vitamin D, Ergocalciferol, (DRISDOL) 50000 units CAPS capsule  Menorrhagia with irregular cycle  Class 3 obesity with serious comorbidity and body mass index (BMI) of 60.0 to 69.9 in adult, unspecified obesity type (HCC)  PLAN:  Vitamin D Deficiency Catherine [name] was informed that low vitamin D levels contributes to fatigue and are associated with obesity, breast, and colon cancer. She agrees to continue to take prescription Vit D @50 ,000 IU every week, we will refill for 1 month and will re-check labs in 2 to 3 weeks and will follow up for routine testing of vitamin D, at least 2-3 times per year. She was informed of the risk of over-replacement of vitamin D and agrees to not increase her dose unless he discusses this with [name] first. Catherine [name] agrees to follow up with our clinic in 2 to 3 weeks  Menorrhagia Catherine [name] was given extended (at least 15 minutes) education on hormone changes with excess adipose tissue and why this leads to abnormal uterine lining thickening. She was encouraged to take her birth control pills and continue with weight loss efforts. Catherine [name] agrees to follow up with our clinic in 2 to 3 weeks.  Obesity Catherine [name] is currently in the action stage of change. As such, her goal is to continue with weight loss efforts She has agreed to follow the Category 3 plan Catherine [name] has been instructed to work up  to a goal of 150 minutes of combined cardio and strengthening exercise per week for weight loss and overall health benefits. We discussed the following Behavioral Modification Strategies today: travel eating strategies, celebration eating strategies, holiday eating strategies  and decrease ETOH on vacation  Catherine [name] has agreed to follow up with our clinic in 2 to 3 weeks. She was informed of the importance of frequent follow up visits to maximize her success with intensive lifestyle modifications for her multiple health conditions.  I, [place] Crane, am acting as transcriptionist for Quillian Quince, MD  I have reviewed the above documentation for accuracy and completeness, and I agree with the above. -Quillian Quince, MD  OBESITY BEHAVIORAL INTERVENTION VISIT  Today's visit was # 12 out of 22.  Starting weight: 468 lbs Starting date: 10/27/16 Today's weight : 426 lbs Today's date: 04/23/2017 Total lbs lost to date: 53 (Patients must lose 7 lbs in the first 6 months to continue with counseling)   ASK: We discussed the diagnosis of obesity with Catherine [name] today and Catherine [name] agreed to give [name] permission to discuss obesity behavioral modification therapy today.  ASSESS: Catherine [name] has the diagnosis of obesity and her BMI today is 66.9 Catherine [name] is in the action stage of change   ADVISE: Catherine [name] was educated on the multiple health risks of obesity as well as the benefit of weight loss to improve her health. She was advised of the need for long term treatment and the importance of lifestyle modifications.  AGREE: Multiple dietary modification options and treatment options were discussed and  Catherine [name] agreed to follow the Category 3 plan We discussed the following Behavioral Modification Strategies today: travel eating strategies, celebration eating strategies, holiday eating strategies  and decrease ETOH on vacation

## 2017-04-24 MED FILL — VIT D2 1.25 MG (50,000 UNIT: 1.25 MG | 28 days supply | Qty: 4 | Fill #0

## 2017-04-27 DIAGNOSIS — R030 Elevated blood-pressure reading, without diagnosis of hypertension: Secondary | ICD-10-CM | POA: Diagnosis not present

## 2017-04-27 DIAGNOSIS — Z6841 Body Mass Index (BMI) 40.0 and over, adult: Secondary | ICD-10-CM | POA: Diagnosis not present

## 2017-04-27 DIAGNOSIS — M549 Dorsalgia, unspecified: Secondary | ICD-10-CM | POA: Diagnosis not present

## 2017-04-27 DIAGNOSIS — G4733 Obstructive sleep apnea (adult) (pediatric): Secondary | ICD-10-CM | POA: Diagnosis not present

## 2017-04-27 DIAGNOSIS — M5126 Other intervertebral disc displacement, lumbar region: Secondary | ICD-10-CM | POA: Diagnosis not present

## 2017-04-27 MED FILL — DICLOFENAC POT 50 MG TABLET: 50 | 30 days supply | Qty: 60 | Fill #0

## 2017-04-28 ENCOUNTER — Ambulatory Visit: Payer: 59 | Admitting: Family Medicine

## 2017-04-28 MED FILL — GABAPENTIN 300 MG CAPSULE: 300 | 30 days supply | Qty: 270 | Fill #0

## 2017-05-01 ENCOUNTER — Encounter: Payer: Self-pay | Admitting: Pulmonary Disease

## 2017-05-01 ENCOUNTER — Ambulatory Visit (INDEPENDENT_AMBULATORY_CARE_PROVIDER_SITE_OTHER): Payer: 59 | Admitting: Pulmonary Disease

## 2017-05-01 VITALS — BP 126/78 | HR 101 | Ht 67.0 in | Wt >= 6400 oz

## 2017-05-01 DIAGNOSIS — M2560 Stiffness of unspecified joint, not elsewhere classified: Secondary | ICD-10-CM | POA: Diagnosis not present

## 2017-05-01 DIAGNOSIS — R293 Abnormal posture: Secondary | ICD-10-CM | POA: Diagnosis not present

## 2017-05-01 DIAGNOSIS — M545 Low back pain: Secondary | ICD-10-CM | POA: Diagnosis not present

## 2017-05-01 DIAGNOSIS — G4733 Obstructive sleep apnea (adult) (pediatric): Secondary | ICD-10-CM

## 2017-05-01 DIAGNOSIS — R262 Difficulty in walking, not elsewhere classified: Secondary | ICD-10-CM | POA: Diagnosis not present

## 2017-05-01 NOTE — Patient Instructions (Signed)
Continue using his CPAP as prescribed Return to clinic in 6 months.

## 2017-05-01 NOTE — Progress Notes (Signed)
Catherine [name]    [id number]    Feb 20, 1981  Primary Care Physician:Wallace, Kandee Keen, DO  Referring Physician: Arvilla Market, DO [address] [place], [place] [address]  Chief complaint:   Follow up for sleep apnea  HPI: 36 year old with morbid obesity, asthma diagnosed with sleep apnea in 2014. She has been maintained on CPAP of 9 with good results. She recently moved and lost her CPAP supplies and has not been using therapy for the past 6 months. She is noticed increase in her daytime somnolence, worsening of her sleep quality.  She is an ex-smoker who quit in 2016. 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 She has history of asthma, seasonal allergies, GERD and was previously maintained on albuterol. She was started on Qvar last year due to increased use of albuterol and has been stable on this regimen.  Interim History: She has been restarted on CPAP at night. She is using it every day with good compliance and results. She has no new complaints today.  Outpatient Encounter Prescriptions as of 05/01/2017  Medication Sig  . aspirin-acetaminophen-caffeine (EXCEDRIN MIGRAINE) 250-250-65 MG per tablet Take 1 tablet by mouth 2 (two) times daily as needed for migraine. For headache  . budesonide (PULMICORT) 180 MCG/ACT inhaler Inhale 1 puff into the lungs 2 (two) times daily.  Marland Kitchen buPROPion (WELLBUTRIN SR) 200 MG 12 hr tablet Take 1 tablet (200 mg total) by mouth every morning.  . cetirizine (ZYRTEC) 10 MG tablet Take 1 tablet (10 mg total) by mouth daily.  . diclofenac sodium (VOLTAREN) 1 % GEL Apply 2 g topically 4 (four) times daily.  . furosemide (LASIX) 20 MG tablet TAKE 1 TABLET (20 MG TOTAL) BY MOUTH DAILY.  Marland Kitchen gabapentin (NEURONTIN) 300 MG capsule Take 2 capsules (600 mg total) by mouth 3 (three) times daily. (Patient taking differently: Take 900 mg by mouth 3 (three) times daily. )  . ibuprofen (ADVIL,MOTRIN)  200 MG tablet Take 200 mg by mouth every 6 (six) hours as needed for moderate pain.  . norgestimate-ethinyl estradiol (ORTHO-CYCLEN,SPRINTEC,PREVIFEM) 0.25-35 MG-MCG tablet Take 1 tablet by mouth daily.  . ranitidine (ZANTAC) 150 MG tablet TAKE 1 TABLET BY MOUTH 2 TIMES DAILY.  Marland Kitchen Vitamin D, Ergocalciferol, (DRISDOL) 50000 units CAPS capsule Take 1 capsule (50,000 Units total) by mouth every 7 (seven) days.  . [DISCONTINUED] allopurinol (ZYLOPRIM) 100 MG tablet TAKE 1 TABLET BY MOUTH DAILY.  . [DISCONTINUED] baclofen (LIORESAL) 10 MG tablet Take 1 tablet (10 mg total) by mouth 3 (three) times daily as needed for muscle spasms.  . [DISCONTINUED] benzonatate (TESSALON) 100 MG capsule Take 1 capsule (100 mg total) by mouth 2 (two) times daily as needed for cough.  . [DISCONTINUED] medroxyPROGESTERone (PROVERA) 10 MG tablet Take 1 tablet (10 mg total) by mouth daily. For ten days.  . [DISCONTINUED] ondansetron (ZOFRAN ODT) 8 MG disintegrating tablet Take 1 tablet (8 mg total) by mouth every 8 (eight) hours as needed for nausea or vomiting.  . [DISCONTINUED] potassium chloride (K-DUR) 10 MEQ tablet Take 1 tablet (10 mEq total) by mouth daily.  . [DISCONTINUED] traMADol (ULTRAM) 50 MG tablet Take 1 tablet (50 mg total) by mouth every 8 (eight) hours as needed.   No facility-administered encounter medications on file as of 05/01/2017.     Allergies as of 05/01/2017 - Review Complete 05/01/2017  Allergen Reaction Noted  . Citrus Swelling 09/08/2016  Past Medical History:  Diagnosis Date  . Allergy   . Anxiety   . Arthritis   . Asthma    since baby  . ASTHMA, UNSPECIFIED 12/10/2006   Qualifier: Diagnosis of  By: Abundio Miu    . CELLULITIS AND ABSCESS OF LEG EXCEPT FOOT 12/30/2007   Qualifier: Diagnosis of  By: Daphine Deutscher FNP, Zena Amos    . DEPENDENT EDEMA, LEGS, BILATERAL 11/22/2008   Qualifier: Diagnosis of  By: Barbaraann Barthel MD, [place]    . Depression 2003Dx  . Edema   . Food allergy     citris  . FOOT PAIN, RIGHT 11/22/2008   Qualifier: Diagnosis of  By: Barbaraann Barthel MD, [place]    . Gastric ulcer   . GERD (gastroesophageal reflux disease)   . Gout   . Hypertension   . Joint pain   . KNEE INJURY, LEFT 01/01/2009   Qualifier: Diagnosis of  By: Delrae Alfred MD, Lanora Manis    . Migraine   . ONYCHOMYCOSIS, TOENAILS 11/22/2008   Qualifier: Diagnosis of  By: Barbaraann Barthel MD, [place]    . Shortness of breath   . Sleep apnea   . TINEA PEDIS 11/22/2008   Qualifier: Diagnosis of  By: Barbaraann Barthel MD, [place]      Past Surgical History:  Procedure Laterality Date  . WISDOM TOOTH EXTRACTION  2008   x4    Family History  Problem Relation Age of Onset  . Hypertension Mother   . Hyperlipidemia Mother   . Cancer Mother        bone cancer  . Heart disease Mother   . Sudden death Mother   . Stroke Mother   . Thyroid disease Mother   . Sleep apnea Mother   . Obesity Mother   . Sudden death Father   . Hypertension Father   . Heart disease Father   . Hyperlipidemia Father   . Prostate cancer Father   . Lung cancer Father   . Obesity Father   . Alcoholism Father   . Sudden death Brother   . Hypertension Brother   . Hyperlipidemia Brother   . Heart attack Brother   . Heart disease Brother   . Stroke Brother   . Alcoholism Brother   . Drug abuse Brother   . Heart disease Sister   . Hypertension Sister   . Thyroid cancer Sister   . Diabetes Neg Hx   . Colon cancer Neg Hx   . Stomach cancer Neg Hx   . Esophageal cancer Neg Hx   . Rectal cancer Neg Hx   . Liver cancer Neg Hx     Social History   Social History  . Marital status: Married    Spouse name: N/A  . Number of children: 3  . Years of education: college    Occupational History  . 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 Smoking status: Former Smoker    Packs/day: 0.50    Years: 18.00    Types: Cigarettes    Start date: 10/13/1996    Quit date: 01/12/2015  . Smokeless  tobacco: Never Used  . Alcohol use 0.6 oz/week    1 Standard drinks or equivalent per week     Comment: rarely drinks  . Drug use: No     Comment: marjuana - former use, quit 4 years ago  . Sexual activity: No   Other Topics Concern  . Not on file   Social History  Narrative  . No narrative on file    Review of systems: Review of Systems  Constitutional: Negative for fever and chills.  HENT: Negative.   Eyes: Negative for blurred vision.  Respiratory: as per HPI  Cardiovascular: Negative for chest pain and palpitations.  Gastrointestinal: Negative for vomiting, diarrhea, blood per rectum. Genitourinary: Negative for dysuria, urgency, frequency and hematuria.  Musculoskeletal: Negative for myalgias, back pain and joint pain.  Skin: Negative for itching and rash.  Neurological: Negative for dizziness, tremors, focal weakness, seizures and loss of consciousness.  Endo/Heme/Allergies: Negative for environmental allergies.  Psychiatric/Behavioral: Negative for depression, suicidal ideas and hallucinations.  All other systems reviewed and are negative.  Physical Exam: Blood pressure 126/78, pulse (!) 101, height 5\' 7"  (1.702 m), weight (!) 435 lb 9.6 oz (197.6 kg), SpO2 98 %. Gen:      No acute distress HEENT:  EOMI, sclera anicteric Neck:     No masses; no thyromegaly Lungs:    Clear to auscultation bilaterally; normal respiratory effort CV:         Regular rate and rhythm; no murmurs Abd:      + bowel sounds; soft, non-tender; no palpable masses, no distension Ext:    No edema; adequate peripheral perfusion Skin:      Warm and dry; no rash Neuro: alert and oriented x 3 Psych: normal mood and affect  Data Reviewed: Sleep study 06/05/13 1. No potential for sleep disturbance because the patient normally     works 3rd shift. 2. Moderate obstructive sleep apnea/hypopnea syndrome, AHI of 20.6 per     hour with events more common while supine.  Loud snoring with     oxygen  desaturation to a nadir of 84% on room air. 3. Successful CPAP titration to 9 CWP, AHI 0 per hour.  She wore a     medium ResMed Mirage Quattro full-face     mask with heated humidifier.  Snoring was prevented and mean oxygen     saturation held 97.1% on room air with CPAP.  CXR 05/08/15-mild interstitial prominence. I have reviewed the images personally.  Epworth sleepiness scale 01/08/27-19  Assessment:  Moderate sleep apnea She has been resumed CPAP and is tolerating it well. Check download on follow up visit.   Asthma Managed by primary care. Stable on pulmicort inhaler which she uses only intermittently  Plan/Recommendations: - Continue CPAP  Chilton GreathousePraveen Otila Starn MD [facility] Pager [phone] 05/01/2017, 2:24 PM  CC: Leary RocaWallace, Catherine Laur*

## 2017-05-04 ENCOUNTER — Telehealth: Payer: Self-pay | Admitting: Internal Medicine

## 2017-05-04 ENCOUNTER — Encounter: Payer: Self-pay | Admitting: Internal Medicine

## 2017-05-04 DIAGNOSIS — R293 Abnormal posture: Secondary | ICD-10-CM | POA: Diagnosis not present

## 2017-05-04 DIAGNOSIS — R262 Difficulty in walking, not elsewhere classified: Secondary | ICD-10-CM | POA: Diagnosis not present

## 2017-05-04 DIAGNOSIS — M545 Low back pain: Secondary | ICD-10-CM | POA: Diagnosis not present

## 2017-05-04 DIAGNOSIS — M2560 Stiffness of unspecified joint, not elsewhere classified: Secondary | ICD-10-CM | POA: Diagnosis not present

## 2017-05-04 DIAGNOSIS — N938 Other specified abnormal uterine and vaginal bleeding: Secondary | ICD-10-CM

## 2017-05-04 MED ORDER — MEDROXYPROGESTERONE ACETATE 10 MG PO TABS: 50.0000 mg | ORAL_TABLET | Freq: Every day | ORAL | 0 refills | Status: DC

## 2017-05-04 MED FILL — MEDROXYPROGESTERONE 10 MG T: 10 | 5 days supply | Qty: 25 | Fill #0

## 2017-05-04 NOTE — Telephone Encounter (Signed)
Returned patient's call about menstrual cycle. She had discontinuation of bleeding with Provera 10 mg but started bleeding again on about day 4-5 of her OCP and has had heavy bleeding for one week.   Told patient that I would order a pelvic ultrasound. I have placed this order and will ask [facility] C[facility] to schedule ultrasound.   Patient reported that she was given a medication in 2014 to stop DUB that worked well. Please tell patient that from chart review it appears she was prescribed Provera 30 mg x7 days.   Precepted with Dr. Jennette KettleNeal who recommended Provera 50 mg x5 days. I have sent this in to her pharmacy.   For her sciatica, I have also done another temporary handicap placard as she just started PT today. This will be at front desk for pick up.   [name], D.O. 05/04/2017, 4:01 PM PGY-3, [facility]

## 2017-05-04 NOTE — Telephone Encounter (Signed)
Patient requesting to speak to PCP or nurse about her last visit and her menstrual cycle. Please call patient at [phone].

## 2017-05-05 NOTE — Telephone Encounter (Signed)
Scheduled US for 7/31 @10 :40 am. [facility]. Informed pt she will need to drink 32oz of water 1 hour before the appt and to hold her urine. Pt has good understanding. Sunday SpillersSharon T Dyshaun Bonzo, CMA

## 2017-05-08 ENCOUNTER — Other Ambulatory Visit: Payer: 59

## 2017-05-08 DIAGNOSIS — M2560 Stiffness of unspecified joint, not elsewhere classified: Secondary | ICD-10-CM | POA: Diagnosis not present

## 2017-05-08 DIAGNOSIS — R293 Abnormal posture: Secondary | ICD-10-CM | POA: Diagnosis not present

## 2017-05-08 DIAGNOSIS — R262 Difficulty in walking, not elsewhere classified: Secondary | ICD-10-CM | POA: Diagnosis not present

## 2017-05-08 DIAGNOSIS — M545 Low back pain: Secondary | ICD-10-CM | POA: Diagnosis not present

## 2017-05-12 ENCOUNTER — Ambulatory Visit
Admission: RE | Admit: 2017-05-12 | Discharge: 2017-05-12 | Disposition: A | Discharge status: Home / Self Care | Payer: 59 | Source: Ambulatory Visit | Attending: Family Medicine | Admitting: Family Medicine

## 2017-05-12 ENCOUNTER — Encounter: Payer: Self-pay | Admitting: Family Medicine

## 2017-05-12 ENCOUNTER — Ambulatory Visit (INDEPENDENT_AMBULATORY_CARE_PROVIDER_SITE_OTHER): Payer: 59 | Admitting: Family Medicine

## 2017-05-12 DIAGNOSIS — N939 Abnormal uterine and vaginal bleeding, unspecified: Secondary | ICD-10-CM | POA: Diagnosis not present

## 2017-05-12 DIAGNOSIS — N938 Other specified abnormal uterine and vaginal bleeding: Secondary | ICD-10-CM

## 2017-05-12 NOTE — Progress Notes (Signed)
Medical Nutrition Therapy:  Appt start time: 0900 end time:  1000. Spouse: Tabitha  Assessment:  Primary concerns today: Weight management.  [name] continues to see Dr. Bernestine AmassBeasely for weight loss.  For the past two weeks, [name] has been getting PT 2 X wk for a disc that is pressing on her vartebrae, as well as a cyst on her spine.  She does rehab ex's at home an additional 4 X wk.  This has been discouraging to her, but she remains committed to her efforts to eat and exercise well despite this new restriction.    Jlynn's dislikes the fat-free salad dressings she has tried.  We discussed the concept of the importance of pleasure from eating if choice are to be sustainable; yet, at the same time, we want to balance this with working toward healthy food preferences.      [name] has consistently been using her C-PAP machine, and is getting good sleep.  She is also more consistently working second shift only, which helps her sleep.  She is hoping to switch to first-shift if a position becomes available.    Current prescribed food plan (vegetarian + egg + fish and some dairy) from [facility] includes: Breakfast: 2-3 protein, 2 X 45-kcal bread Lunch: 200-250 kcal frzn meal (w/ >20 g protein) OR 4 oz meat, 2 slc 45-kcal brd, 1 fruit, 1 c sk milk Dinner: 2 c green veg's OR 1 c starchy veg's, 8-10 lean protein, 1 slc 45-kcal brd Snks: up to 3 X 100 kcal, at least 1 with fiber; usually getting one snack per day.  Drinks may include up to 32 oz diet drinks/day OR 5-kcal powdered drinks, but [name] is seldom drinking anything but water.    24-hr recall:  (Up at 7:30 AM) B (8:30 AM)-  2 scrmbld eggs, 1 slc 45-kcal brd, 1 oz mozzarella Snk ( AM)-  water L (12:30 PM)-  <2 c lentil soup, 1 apple, water Snk ( PM)-  --- D (4:30 PM)-  2 oz pasta, 1/2 c 2% cot chs, 1 oz mozzarella, 1 c spag sauce, 3 c salad, f-f drsng Snk ( PM)-  --- Typical day? Yes.    Progress Towards Goal(s):  In  progress.   Nutritional Diagnosis:  Continued progress noted on NI-5.8.3 Inappropriate intake of types of carbohydrates (specify): (refined carbs) As related to beverages.  As evidenced by water as only beverage reported.    Intervention:  Nutrition education.  Handouts given during visit include:  AVS  Demonstrated degree of understanding via:  Teach Back  Barriers to learning/adherence to lifestyle change: No obvious barriers at this time; patient's food choices have become pretty habitual, and she does not identify any significant struggles with respect to diet.      Monitoring/Evaluation:  Dietary intake, exercise, and body weight in 12 week(s).  [name] will continue to see Dr. Dalbert GarnetBeasley 2 X mo, and me once every 3 months.

## 2017-05-12 NOTE — Patient Instructions (Addendum)
-   Salad dressing:  Try having any dressing on the side, and dip your fork in the dressing before spearing the lettuce.  Another option to try is plain balsamic vinegar, especially if your salad has some other source of fat, such as avocado.    - Think about what "add-ons" you can include in salads to keep them interesting, such as water chestnuts, different veg's than your usual, small amounts (1 tsp) of toasted sunflower seeds or dried fruit.    Goals: - PT exercises at least 4 X wk at home (plus two PT appts per week).    - Document your successes in your journal:  Either write down your exercises (and reps) OR simply use check marks to show you have done your rehab exercises.  - Continue [facility] appts every two weeks, and check in with me every 3 months.    - Follow-up with Catherine [name]: Oct 23 at 9 AM.

## 2017-05-13 ENCOUNTER — Ambulatory Visit: Payer: 59 | Attending: Nurse Practitioner | Admitting: Physical Therapy

## 2017-05-13 ENCOUNTER — Ambulatory Visit (INDEPENDENT_AMBULATORY_CARE_PROVIDER_SITE_OTHER): Payer: 59 | Admitting: Family Medicine

## 2017-05-13 ENCOUNTER — Telehealth: Payer: Self-pay | Admitting: Internal Medicine

## 2017-05-13 ENCOUNTER — Encounter: Payer: Self-pay | Admitting: Physical Therapy

## 2017-05-13 VITALS — BP 116/81 | HR 80 | Temp 98.0°F | Ht 67.0 in | Wt >= 6400 oz

## 2017-05-13 DIAGNOSIS — IMO0001 Reserved for inherently not codable concepts without codable children: Secondary | ICD-10-CM

## 2017-05-13 DIAGNOSIS — E669 Obesity, unspecified: Secondary | ICD-10-CM | POA: Diagnosis not present

## 2017-05-13 DIAGNOSIS — Z9189 Other specified personal risk factors, not elsewhere classified: Secondary | ICD-10-CM | POA: Diagnosis not present

## 2017-05-13 DIAGNOSIS — M62830 Muscle spasm of back: Secondary | ICD-10-CM

## 2017-05-13 DIAGNOSIS — M5441 Lumbago with sciatica, right side: Secondary | ICD-10-CM | POA: Insufficient documentation

## 2017-05-13 DIAGNOSIS — F3289 Other specified depressive episodes: Secondary | ICD-10-CM

## 2017-05-13 DIAGNOSIS — E559 Vitamin D deficiency, unspecified: Secondary | ICD-10-CM

## 2017-05-13 DIAGNOSIS — G8929 Other chronic pain: Secondary | ICD-10-CM

## 2017-05-13 DIAGNOSIS — Z6841 Body Mass Index (BMI) 40.0 and over, adult: Secondary | ICD-10-CM | POA: Diagnosis not present

## 2017-05-13 DIAGNOSIS — N938 Other specified abnormal uterine and vaginal bleeding: Secondary | ICD-10-CM

## 2017-05-13 MED ORDER — VITAMIN D (ERGOCALCIFEROL) 1.25 MG (50000 UNIT) PO CAPS: 50000.0000 [IU] | ORAL_CAPSULE | ORAL | 0 refills | Status: DC

## 2017-05-13 MED ORDER — BUPROPION HCL ER (SR) 200 MG PO TB12
200.0000 mg | ORAL_TABLET | Freq: Every morning | ORAL | 0 refills | Status: DC
Start: 2017-05-13 — End: 2017-07-16

## 2017-05-13 NOTE — Telephone Encounter (Signed)
Was able speak with patient on the phone. Reports that her bleeding stopped with second prescription of Provera. However, a day after finishing the course she started having DUB again and has continued to bleed. She denies light headedness, dizziness, paleness of skin, or extreme fatigue. I have recommended she start taking Fe supplement given the amount of DUB she has had recently. If she exhibits any of the above symptoms as well as chest pain or SOB concerning for symptomatic acute blood loss anemia she is to call office. Have placed referral to Ob-gyn for DUB. Discussed some of the options that Ob-gyn might offer at patient's visit.   Marcy Sirenatherine Deirdra Heumann, D.O. 05/13/2017, 10:28 AM PGY-3, [facility]

## 2017-05-13 NOTE — Addendum Note (Signed)
Addended by: De HollingsheadWALLACE, CATHERINE L on: 05/13/2017 10:28 AM   Modules accepted: Orders

## 2017-05-13 NOTE — Telephone Encounter (Signed)
Pt was advised, but would like PCP to call her back. ep

## 2017-05-13 NOTE — Progress Notes (Signed)
Office: [phone]  /  Fax: [phone]   HPI:   Chief Complaint: OBESITY Catherine [name] is here to discuss her progress with her obesity treatment plan. She is on the  follow the Category 3 plan and follow the Pescatarian  eating plan and is following her eating plan approximately 90 % of the time. She states she is exercising 0 minutes 0 times per week. Catherine [name]  Struggled to stick with a plan over the last 2 weeks and wasn't able to stay on track while on provera but this is finished and she is ready to get back on track. Her weight is (!) 430 lb (195 kg) today and has had a weight gain of 4 pounds over a period of 2 to 3 weeks since her last visit. She has lost 38 lbs since starting treatment with [name].  Vitamin D deficiency Catherine [name] has a diagnosis of vitamin D deficiency. She is currently taking vit D and her last lab was not at goal. She still notes fatigue and denies nausea, vomiting or muscle weakness.  At risk for osteopenia Catherine [name] is at higher risk of osteopenia and osteoporosis due to vitamin D deficiency.   Depression with emotional eating behaviors Catherine [name]'s mood is stable but she is struggling with increased emotional eating while on provera. She recognizes this is happening and wants to work on strategies to improve this. Catherine [name] struggles with emotional eating and using food for comfort to the extent that it is negatively impacting her health. She often snacks when she is not hungry. Catherine [name] sometimes feels she is out of control and then feels guilty that she made poor food choices. She has been working on behavior modification techniques to help reduce her emotional eating and has been somewhat successful. She shows no sign of suicidal or homicidal ideations.  Depression screen Catherine [name] 2/9 03/24/2017 02/04/2017 11/21/2016 10/27/2016 09/08/2016  Decreased Interest 0 0 0 3 0  Down, Depressed, Hopeless 0 0 0 3 0  PHQ - 2 Score 0 0 0 6 0  Altered sleeping - - - 2 -  Tired, decreased energy  - - - 2 -  Change in appetite - - - 3 -  Feeling bad or failure about yourself  - - - 2 -  Trouble concentrating - - - 3 -  Moving slowly or fidgety/restless - - - 3 -  Suicidal thoughts - - - 2 -  PHQ-9 Score - - - 23 -  Some encounter information is confidential and restricted. Go to Review Flowsheets activity to see all data.      ALLERGIES: Allergies  Allergen Reactions   Citrus Swelling    MEDICATIONS: Current Outpatient Prescriptions on File Prior to Visit  Medication Sig Dispense Refill   aspirin-acetaminophen-caffeine (EXCEDRIN MIGRAINE) 250-250-65 MG per tablet Take 1 tablet by mouth 2 (two) times daily as needed for migraine. For headache     budesonide (PULMICORT) 180 MCG/ACT inhaler Inhale 1 puff into the lungs 2 (two) times daily. 1 Inhaler 1   cetirizine (ZYRTEC) 10 MG tablet Take 1 tablet (10 mg total) by mouth daily. 30 tablet 2   diclofenac sodium (VOLTAREN) 1 % GEL Apply 2 g topically 4 (four) times daily. 100 g 1   furosemide (LASIX) 20 MG tablet TAKE 1 TABLET (20 MG TOTAL) BY MOUTH DAILY. 30 tablet 1   gabapentin (NEURONTIN) 300 MG capsule Take 2 capsules (600 mg total) by mouth 3 (three) times daily. (Patient taking differently: Take 900 mg by mouth 3 (  three) times daily. ) 180 capsule 3   ibuprofen (ADVIL,MOTRIN) 200 MG tablet Take 200 mg by mouth every 6 (six) hours as needed for moderate pain.     norgestimate-ethinyl estradiol (ORTHO-CYCLEN,SPRINTEC,PREVIFEM) 0.25-35 MG-MCG tablet Take 1 tablet by mouth daily. 1 Package 11   ranitidine (ZANTAC) 150 MG tablet TAKE 1 TABLET BY MOUTH 2 TIMES DAILY. 60 tablet 0   No current facility-administered medications on file prior to visit.     PAST MEDICAL HISTORY: Past Medical History:  Diagnosis Date   Allergy    Anxiety    Arthritis    Asthma    since baby   ASTHMA, UNSPECIFIED 12/10/2006   Qualifier: Diagnosis of  By: Abundio MiuMcGregor, Barbara     CELLULITIS AND ABSCESS OF LEG EXCEPT FOOT 12/30/2007     Qualifier: Diagnosis of  By: Daphine DeutscherMartin FNP, [place]     DEPENDENT EDEMA, LEGS, BILATERAL 11/22/2008   Qualifier: Diagnosis of  By: Barbaraann Barthelankins MD, [place]     Depression [id number]   Edema    Food allergy    citris   FOOT PAIN, RIGHT 11/22/2008   Qualifier: Diagnosis of  By: Barbaraann Barthelankins MD, [place]     Gastric ulcer    GERD (gastroesophageal reflux disease)    Gout    Hypertension    Joint pain    KNEE INJURY, LEFT 01/01/2009   Qualifier: Diagnosis of  By: Delrae AlfredMulberry MD, Elizabeth     Migraine    ONYCHOMYCOSIS, TOENAILS 11/22/2008   Qualifier: Diagnosis of  By: Barbaraann Barthelankins MD, [place]     Shortness of breath    Sleep apnea    TINEA PEDIS 11/22/2008   Qualifier: Diagnosis of  By: Barbaraann Barthelankins MD, Benetta SparVictoria      PAST SURGICAL HISTORY: Past Surgical History:  Procedure Laterality Date   WISDOM TOOTH EXTRACTION  2008   x4    SOCIAL HISTORY: Social History  Substance Use Topics   Smoking status: Former Smoker    Packs/day: 0.50    Years: 18.00    Types: Cigarettes    Start date: 10/13/1996    Quit date: 01/12/2015   Smokeless tobacco: Never Used   Alcohol use 0.6 oz/week    1 Standard drinks or equivalent per week     Comment: rarely drinks    FAMILY HISTORY: Family History  Problem Relation Age of Onset   Hypertension Mother    Hyperlipidemia Mother    Cancer Mother        bone cancer   Heart disease Mother    Sudden death Mother    Stroke Mother    Thyroid disease Mother    Sleep apnea Mother    Obesity Mother    Sudden death Father    Hypertension Father    Heart disease Father    Hyperlipidemia Father    Prostate cancer Father    Lung cancer Father    Obesity Father    Alcoholism Father    Sudden death Brother    Hypertension Brother    Hyperlipidemia Brother    Heart attack Brother    Heart disease Brother    Stroke Brother    Alcoholism Brother    Drug abuse Brother    Heart disease Sister    Hypertension Sister     Thyroid cancer Sister    Diabetes Neg Hx    Colon cancer Neg Hx    Stomach cancer Neg Hx    Esophageal cancer Neg Hx    Rectal cancer Neg Hx  Liver cancer Neg Hx     ROS: Review of Systems  Constitutional: Positive for malaise/fatigue. Negative for weight loss.  Gastrointestinal: Negative for nausea and vomiting.  Musculoskeletal:       Negative muscle weakness  Psychiatric/Behavioral: Positive for depression. Negative for suicidal ideas.    PHYSICAL EXAM: Blood pressure 116/81, pulse 80, temperature 98 F (36.7 C), temperature source Oral, height 5\' 7"  (1.702 m), weight (!) 430 lb (195 kg), SpO2 96 %. Body mass index is 67.35 kg/m. Physical Exam  Constitutional: She is oriented to person, place, and time. She appears well-developed and well-nourished.  Cardiovascular: Normal rate.   Pulmonary/Chest: Effort normal.  Musculoskeletal: Normal range of motion.  Neurological: She is oriented to person, place, and time.  Skin: Skin is warm and dry.  Psychiatric: She has a normal mood and affect. Her behavior is normal.  Vitals reviewed.   RECENT LABS AND TESTS: BMET    Component Value Date/Time   NA 140 01/20/2017 0831   K 4.4 01/20/2017 0831   CL 102 01/20/2017 0831   CO2 23 01/20/2017 0831   GLUCOSE 80 01/20/2017 0831   GLUCOSE 83 06/04/2016 1155   BUN 16 01/20/2017 0831   CREATININE 0.74 01/20/2017 0831   CREATININE 0.90 06/04/2016 1155   CALCIUM 9.3 01/20/2017 0831   GFRNONAA 105 01/20/2017 0831   GFRNONAA 84 06/04/2016 1155   GFRAA 121 01/20/2017 0831   GFRAA >89 06/04/2016 1155   Lab Results  Component Value Date   HGBA1C 5.4 01/20/2017   HGBA1C 5.6 10/27/2016   HGBA1C 5.0 11/21/2015   Lab Results  Component Value Date   INSULIN 29.7 (H) 01/20/2017   INSULIN 30.1 (H) 10/27/2016   CBC    Component Value Date/Time   WBC 9.0 04/13/2017 1242   WBC 13.4 (H) 06/04/2016 1155   RBC 3.98 04/13/2017 1242   RBC 4.37 06/04/2016 1155   HGB 11.1  04/13/2017 1242   HCT 33.9 (L) 04/13/2017 1242   PLT 276 04/13/2017 1242   MCV 85 04/13/2017 1242   MCH 27.9 04/13/2017 1242   MCH 27.7 06/04/2016 1155   MCHC 32.7 04/13/2017 1242   MCHC 32.4 06/04/2016 1155   RDW 15.0 04/13/2017 1242   LYMPHSABS 2.9 01/20/2017 0831   MONOABS 1.3 (H) 09/21/2013 1635   EOSABS 0.1 01/20/2017 0831   BASOSABS 0.0 01/20/2017 0831   Iron/TIBC/Ferritin/ %Sat No results found for: IRON, TIBC, FERRITIN, IRONPCTSAT Lipid Panel     Component Value Date/Time   CHOL 152 10/27/2016 1058   TRIG 57 10/27/2016 1058   HDL 43 10/27/2016 1058   CHOLHDL 3.6 11/21/2015 1047   VLDL 10 11/21/2015 1047   LDLCALC 98 10/27/2016 1058   Hepatic Function Panel     Component Value Date/Time   PROT 6.6 01/20/2017 0831   ALBUMIN 3.9 01/20/2017 0831   AST 21 01/20/2017 0831   ALT 19 01/20/2017 0831   ALKPHOS 65 01/20/2017 0831   BILITOT 0.3 01/20/2017 0831      Component Value Date/Time   TSH 1.460 10/27/2016 1058   TSH 0.96 11/21/2015 1047    ASSESSMENT AND PLAN: Vitamin D deficiency - Plan: Vitamin D, Ergocalciferol, (DRISDOL) 50000 units CAPS capsule  Other depression - Plan: buPROPion (WELLBUTRIN SR) 200 MG 12 hr tablet  At risk for osteoporosis  Class 3 obesity with serious comorbidity and body mass index (BMI) of 60.0 to 69.9 in adult, unspecified obesity type (HCC)  PLAN:  Vitamin D Deficiency Catherine [name] was informed that  low vitamin D levels contributes to fatigue and are associated with obesity, breast, and colon cancer. She agrees to continue to take prescription Vit D @50 ,000 IU every week, we will refill for 1 month and will follow up for routine testing of vitamin D, at least 2-3 times per year. She was informed of the risk of over-replacement of vitamin D and agrees to not increase her dose unless he discusses this with [name] first. Catherine [name] agrees to follow up with our clinic in 2 weeks.  At risk for osteopenia Catherine [name] is at risk for osteopenia and  osteoporsis due to her vitamin D deficiency. She was encouraged to take her vitamin D and follow her higher calcium diet and increase strengthening exercise to help strengthen her bones and decrease her risk of osteopenia and osteoporosis.  Depression with Emotional Eating Behaviors We discussed behavior modification techniques today to help Vilda deal with her emotional eating and depression. She has agreed to take Wellbutrin SR 200 mg qd, we will refill for 1 month and will follow up as directed.  Obesity Catherine [name] is currently in the action stage of change. As such, her goal is to continue with weight loss efforts She has agreed to change to follow the Category 3 plan Catherine [name] has been instructed to work up to a goal of 150 minutes of combined cardio and strengthening exercise per week for weight loss and overall health benefits. We discussed the following Behavioral Modification Strategies today: keeping healthy foods in the home, increasing lean protein intake and emotional eating strategies  Catherine [name] has agreed to follow up with our clinic in 2 weeks. She was informed of the importance of frequent follow up visits to maximize her success with intensive lifestyle modifications for her multiple health conditions.  I, Catherine [name], am acting as transcriptionist for Catherine Quince, MD  I have reviewed the above documentation for accuracy and completeness, and I agree with the above. -Catherine Quince, MD  OBESITY BEHAVIORAL INTERVENTION VISIT  Today's visit was # 13 out of 22.  Starting weight: 468 lbs Starting date: 10/27/16 Today's weight : 430 lbs Today's date: 05/13/2017 Total lbs lost to date: 87 (Patients must lose 7 lbs in the first 6 months to continue with counseling)   ASK: We discussed the diagnosis of obesity with Catherine [name] today and Catherine [name] agreed to give [name] permission to discuss obesity behavioral modification therapy today.  ASSESS: Catherine [name] has the diagnosis of  obesity and her BMI today is 67.5 Catherine [name] is in the action stage of change   ADVISE: Catherine [name] was educated on the multiple health risks of obesity as well as the benefit of weight loss to improve her health. She was advised of the need for long term treatment and the importance of lifestyle modifications.  AGREE: Multiple dietary modification options and treatment options were discussed and  Ednah agreed to change to follow the Category 3 plan We discussed the following Behavioral Modification Strategies today: keeping healthy foods in the home, increasing lean protein intake and emotional eating strategies

## 2017-05-13 NOTE — Telephone Encounter (Signed)
Left VM for patient to return call about pelvic US results. Pelvic sono noted to be normal. No anatomic abnormality to explain dysfunction uterine bleeding. If patient still having trouble with irregular, heavy menstrual bleeding would recommend referral to GYN. Patient may be candidate for procedure such as endometrial ablation if bleeding problems persist.   Marcy Sirenatherine Anae Hams, D.O. 05/13/2017, 9:07 AM PGY-3, [facility]

## 2017-05-14 ENCOUNTER — Encounter: Payer: Self-pay | Admitting: Physical Therapy

## 2017-05-14 NOTE — Therapy (Signed)
[facility] [address] [place], [place], [address] Phone: [phone]   Fax:  [phone]  Physical Therapy Evaluation  Patient Details  Name: Catherine [name] MRN: [id number] Date of Birth: Nov 15, 1980 Referring Provider: Lisbeth RenshawNeelesh, Nundkumar MD   Encounter Date: 05/13/2017      PT End of Session - 05/14/17 0919    Visit Number 1   Number of Visits 16   Date for PT Re-Evaluation 07/09/17   Authorization Type [facility]    PT Start Time 1100   PT Stop Time 1145   PT Time Calculation (min) 45 min   Activity Tolerance Patient tolerated treatment well   Behavior [facility] for tasks assessed/performed      Past Medical History:  Diagnosis Date  . Allergy   . Anxiety   . Arthritis   . Asthma    since baby  . ASTHMA, UNSPECIFIED 12/10/2006   Qualifier: Diagnosis of  By: Abundio MiuMcGregor, Barbara    . CELLULITIS AND ABSCESS OF LEG EXCEPT FOOT 12/30/2007   Qualifier: Diagnosis of  By: Daphine DeutscherMartin FNP, Zena AmosNykedtra    . DEPENDENT EDEMA, LEGS, BILATERAL 11/22/2008   Qualifier: Diagnosis of  By: Barbaraann Barthelankins MD, [place]    . Depression 2003Dx  . Edema   . Food allergy    citris  . FOOT PAIN, RIGHT 11/22/2008   Qualifier: Diagnosis of  By: Barbaraann Barthelankins MD, [place]    . Gastric ulcer   . GERD (gastroesophageal reflux disease)   . Gout   . Hypertension   . Joint pain   . KNEE INJURY, LEFT 01/01/2009   Qualifier: Diagnosis of  By: Delrae AlfredMulberry MD, Lanora ManisElizabeth    . Migraine   . ONYCHOMYCOSIS, TOENAILS 11/22/2008   Qualifier: Diagnosis of  By: Barbaraann Barthelankins MD, [place]    . Shortness of breath   . Sleep apnea   . TINEA PEDIS 11/22/2008   Qualifier: Diagnosis of  By: Barbaraann Barthelankins MD, [place]      Past Surgical History:  Procedure Laterality Date  . WISDOM TOOTH EXTRACTION  2008   x4    There were no vitals filed for this visit.       Subjective Assessment - 05/13/17 1114    Subjective Patient has a 3 month histroy of lower back pain that radiates into  the right lower extremity. The pain becomes worse with sleeping, sitting, and a mild increase with walking. 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 She sits for long periods of time.    Limitations Sitting;Standing;Walking   How long can you sit comfortably? < 2 hours    How long can you walk comfortably? limited community distances with pain    Diagnostic tests MRI: L5 S! Cyst compressing on the L1 nerve root    Currently in Pain? Yes   Pain Score 7    Pain Location Back   Pain Orientation Right   Pain Descriptors / Indicators Aching   Pain Type Chronic pain   Pain Onset More than a month ago   Pain Frequency Constant            [facility] PT Assessment - 05/14/17 0001      Assessment   Medical Diagnosis Right sided lower back pain with right radiculopathy    Referring Provider Lisbeth RenshawNeelesh, Nundkumar MD    Onset Date/Surgical Date --  3 months prior    Hand Dominance Right   Next MD Visit Nothing scheduled    Prior Therapy None  Precautions   Precautions None     Restrictions   Weight Bearing Restrictions No   Other Position/Activity Restrictions n     Balance Screen   Has the patient fallen in the past 6 months No   Has the patient had a decrease in activity level because of a fear of falling?  No   Is the patient reluctant to leave their home because of a fear of falling?  No     Home Environment   Additional Comments 2nd floor apartment      Prior Function   Level of Independence Independent   Vocation Full time employment   Vocation Requirements Works as an Designer, television/film setoperator at [employer]    Leisure Nothing      Cognition   Overall Cognitive Status Within Functional Limits for tasks assessed   Attention Focused   Focused Attention Appears intact   Memory Appears intact   Awareness Appears intact   Problem Solving Appears intact     Observation/Other Assessments   Focus on Therapeutic Outcomes (FOTO)  53% limitation on FOTO      Sensation   Light Touch Appears  Intact   Additional Comments Pain radiating into the left lateral calf      Coordination   Gross Motor Movements are Fluid and Coordinated Yes   Fine Motor Movements are Fluid and Coordinated Yes     Posture/Postural Control   Posture Comments flexed trunk; Morbid obesity      ROM / Strength   AROM / PROM / Strength AROM;PROM;Strength     AROM   AROM Assessment Site Lumbar   Lumbar Flexion no limit mild pain    Lumbar Extension limited 50% with pain    Lumbar - Right Side Bend pain    Lumbar - Left Side Bend no pain    Lumbar - Right Rotation pain no pain      PROM   Overall PROM Comments hip range of motion is limited by soft tissue      Strength   Strength Assessment Site Hip;Knee   Right/Left Hip Right   Right Hip Flexion 4+/5   Right Hip ABduction 4+/5   Right Hip ADduction 4+/5   Right/Left Knee Right   Right Knee Flexion 5/5   Right Knee Extension 5/5     Palpation   Palpation comment tenderness to plapation on the right side of the lumbar spine and into the right upper glut.      Special Tests    Special Tests --  SLR (-) on the right      Ambulation/Gait   Gait Comments decreased hip flexion bilateral; lateral movement with gait.             Objective measurements completed on examination: See above findings.          [facility] Adult PT Treatment/Exercise - 05/14/17 0001      Lumbar Exercises: Stretches   Passive Hamstring Stretch Limitations seated hamstring stretch 2x30sec hold      Lumbar Exercises: Seated   Other Seated Lumbar Exercises seated clam red band with abdominal bracing 2x10; reviewed seated PPT x10;    Other Seated Lumbar Exercises seated side bedn to the left to relieve stress on S1 nerve root x5 with cuing      Manual Therapy   Manual therapy comments Gentle LAD to left leg to decrease compression on S1 nerve root. Improved pain noted  PT Education - 05/14/17 0919    Education provided Yes    Education Details improtance of symptom mangament, anatomy of her back problem    Person(s) Educated Patient   Methods Explanation;Demonstration;Tactile cues;Verbal cues   Comprehension Verbalized understanding;Returned demonstration;Verbal cues required;Tactile cues required          PT Short Term Goals - 05/14/17 1007      PT SHORT TERM GOAL #1   Title Patient will demsotrate a good core contraction    Time 4   Period Weeks   Status New     PT SHORT TERM GOAL #2   Title Patient will demsotrate 5/5 bilateral lower extremity strength    Time 4   Period Weeks   Status New     PT SHORT TERM GOAL #3   Title Patient will report centralized lower back pain with no radiation on the right    Time 4   Period Weeks   Status New     PT SHORT TERM GOAL #4   Title Patient will be independent with basic HEP    Time 4   Period Weeks   Status New           PT Long Term Goals - 05/14/17 1012      PT LONG TERM GOAL #1   Title Patient will sit for 2 hours at work without increased pain and radiation.    Time 8   Period Weeks   Status New   Target Date 07/09/17     PT LONG TERM GOAL #2   Title Patient will walk 3000' without increased pain in order to perfrom IADL's    Time 8   Period Weeks   Status New   Target Date 07/09/17     PT LONG TERM GOAL #3   Title Patient will be independent with basic HEP for core stability    Time 8   Period Weeks   Status New   Target Date 07/09/17                Plan - 05/14/17 1610    Clinical Impression Statement Patient is a 60 year olfd female with a 3 month histroy of lower back pain that radiates into her right lower lateral leg. The pain is worse when she sits for a long period of time. She is morbidly obeses. Her MRI shows a cyst that is pressing on her L1 nerve root. Depsite her radiuclar pain she still has about 4+/5 gross right lower extremity strength. She has some limitations in her hip movement but some of that is  likley from her large habitus. She is very motived to get better and has been consistently doing her exercises given by her therapist last week. She would benefit from skilled therapy to improve her core stability and decrease the pressure on her nerve root.    Clinical Presentation Stable   Clinical Presentation due to: morbid obesity    Clinical Decision Making Moderate   Rehab Potential Good   PT Frequency 2x / week   PT Duration 8 weeks   PT Treatment/Interventions ADLs/Self Care Home Management;Cryotherapy;Electrical Stimulation;Iontophoresis 4mg /ml Dexamethasone;Gait training;Stair training;Ultrasound;Therapeutic activities;Therapeutic exercise;Patient/family education;Passive range of motion;Manual techniques;Taping;Prosthetic Training;Dry needling;Neuromuscular re-education;Moist Heat   PT Next Visit Plan good response to LAD; consider manual therapy to lumbar spine if able; Patient hadsome pain lying on the table to perform core stability exercises eaither sittingor stnading. Pateint may benefit from light physioball stretches wiht the physio ball  PT Home Exercise Plan side bend away from right; seated hamstring stretch, seated clamshell with abdominal brace    Consulted and Agree with Plan of Care Patient      Patient will benefit from skilled therapeutic intervention in order to improve the following deficits and impairments:  Abnormal gait, Decreased activity tolerance, Decreased strength, Pain, Obesity, Increased muscle spasms, Decreased range of motion  Visit Diagnosis: Chronic right-sided low back pain with right-sided sciatica - Plan: PT plan of care cert/re-cert  Muscle spasm of back - Plan: PT plan of care cert/re-cert     Problem List Patient Active Problem List   Diagnosis Date Noted  . Sacral radiculopathy 02/04/2017  . Insulin resistance 02/02/2017  . At risk for violence to self related to depression 02/02/2017  . Morbid obesity (HCC) 01/01/2017  . Sore throat  (viral) 11/21/2016  . Vitamin D deficiency 11/12/2016  . Prediabetes 11/12/2016  . Asthma 07/25/2016  . Abdominal pain 06/04/2016  . Otitis, externa, infective 06/04/2016  . Dysuria 03/20/2016  . Gout 11/21/2015  . Obesity, morbid, BMI 50 or higher (HCC) 05/14/2015  . BIPOLAR DISORDER UNSPECIFIED 11/22/2008  . Depression 12/10/2006  . MIGRAINE, UNSPEC., W/O INTRACTABLE MIGRAINE 12/10/2006  . GASTROESOPHAGEAL REFLUX, NO ESOPHAGITIS 12/10/2006  . OSA (obstructive sleep apnea) 12/10/2006    Dessie Coma PT DPT  05/14/2017, 12:55 PM  [facility] [address] [place], [place], [address] Phone: [phone]   Fax:  [phone]  Name: Catherine [name] MRN: [id number] Date of Birth: [birth year]

## 2017-05-18 ENCOUNTER — Encounter: Payer: Self-pay | Admitting: Family Medicine

## 2017-05-18 ENCOUNTER — Ambulatory Visit (INDEPENDENT_AMBULATORY_CARE_PROVIDER_SITE_OTHER): Payer: 59 | Admitting: Family Medicine

## 2017-05-18 VITALS — BP 116/74 | HR 89 | Temp 98.3°F | Ht 67.0 in | Wt >= 6400 oz

## 2017-05-18 DIAGNOSIS — N924 Excessive bleeding in the premenopausal period: Secondary | ICD-10-CM

## 2017-05-18 DIAGNOSIS — N920 Excessive and frequent menstruation with regular cycle: Secondary | ICD-10-CM | POA: Insufficient documentation

## 2017-05-18 NOTE — Progress Notes (Signed)
Subjective:    Catherine [name] is a 36 y.o. female [facility]:  1.  Menorrhagia: Long-standing issue for the patient. She has been treated in past with 5-10 days of Provera plus oral contraceptive pills she states that this most recent course of Provera stopped her bleeding for about 2 days and then it recurred. The past several days she's had heavy bleeding plus clots. No lightheadedness symptoms. She has felt more "sluggish" for the past 3 or 4 days or so.  She worries that her iron is low. She had iron deficiency around 2009 and had to take oral iron pills for that.  Some mild lower extremity edema past several days.  No dyspnea or chest pain or palpitations.  Some mild abdominal cramping consistent with menses.     ROS as above per HPI.    The following portions of the patient's history were reviewed and updated as appropriate: allergies, current medications, past medical history, family and social history, and problem list. Patient is a nonsmoker.    PMH reviewed.  Past Medical History:  Diagnosis Date  . Allergy   . Anxiety   . Arthritis   . Asthma    since baby  . ASTHMA, UNSPECIFIED 12/10/2006   Qualifier: Diagnosis of  By: Abundio MiuMcGregor, Barbara    . CELLULITIS AND ABSCESS OF LEG EXCEPT FOOT 12/30/2007   Qualifier: Diagnosis of  By: Daphine DeutscherMartin FNP, Zena AmosNykedtra    . DEPENDENT EDEMA, LEGS, BILATERAL 11/22/2008   Qualifier: Diagnosis of  By: Barbaraann Barthelankins MD, [place]    . Depression 2003Dx  . Edema   . Food allergy    citris  . FOOT PAIN, RIGHT 11/22/2008   Qualifier: Diagnosis of  By: Barbaraann Barthelankins MD, [place]    . Gastric ulcer   . GERD (gastroesophageal reflux disease)   . Gout   . Hypertension   . Joint pain   . KNEE INJURY, LEFT 01/01/2009   Qualifier: Diagnosis of  By: Delrae AlfredMulberry MD, Lanora ManisElizabeth    . Migraine   . ONYCHOMYCOSIS, TOENAILS 11/22/2008   Qualifier: Diagnosis of  By: Barbaraann Barthelankins MD, [place]    . Shortness of breath   . Sleep apnea   . TINEA PEDIS  11/22/2008   Qualifier: Diagnosis of  By: Barbaraann Barthelankins MD, [place]     Past Surgical History:  Procedure Laterality Date  . WISDOM TOOTH EXTRACTION  2008   x4    Medications reviewed. Current Outpatient Prescriptions  Medication Sig Dispense Refill  . aspirin-acetaminophen-caffeine (EXCEDRIN MIGRAINE) 250-250-65 MG per tablet Take 1 tablet by mouth 2 (two) times daily as needed for migraine. For headache    . budesonide (PULMICORT) 180 MCG/ACT inhaler Inhale 1 puff into the lungs 2 (two) times daily. 1 Inhaler 1  . buPROPion (WELLBUTRIN SR) 200 MG 12 hr tablet Take 1 tablet (200 mg total) by mouth every morning. 30 tablet 0  . cetirizine (ZYRTEC) 10 MG tablet Take 1 tablet (10 mg total) by mouth daily. 30 tablet 2  . diclofenac (VOLTAREN) 50 MG EC tablet Take 50 mg by mouth 2 (two) times daily.    . diclofenac sodium (VOLTAREN) 1 % GEL Apply 2 g topically 4 (four) times daily. 100 g 1  . furosemide (LASIX) 20 MG tablet TAKE 1 TABLET (20 MG TOTAL) BY MOUTH DAILY. 30 tablet 1  . gabapentin (NEURONTIN) 300 MG capsule Take 2 capsules (600 mg total) by mouth 3 (three) times daily. (Patient taking differently: Take 900 mg by mouth  3 (three) times daily. ) 180 capsule 3  . ibuprofen (ADVIL,MOTRIN) 200 MG tablet Take 200 mg by mouth every 6 (six) hours as needed for moderate pain.    . norgestimate-ethinyl estradiol (ORTHO-CYCLEN,SPRINTEC,PREVIFEM) 0.25-35 MG-MCG tablet Take 1 tablet by mouth daily. 1 Package 11  . ranitidine (ZANTAC) 150 MG tablet TAKE 1 TABLET BY MOUTH 2 TIMES DAILY. 60 tablet 0  . Vitamin D, Ergocalciferol, (DRISDOL) 50000 units CAPS capsule Take 1 capsule (50,000 Units total) by mouth every 7 (seven) days. 4 capsule 0   No current facility-administered medications for this visit.      Objective:   Physical Exam BP 116/74   Pulse 89   Temp 98.3 F (36.8 C) (Oral)   Ht 5\' 7"  (1.702 m)   Wt (!) 432 lb (196 kg)   LMP 02/05/2017 (Approximate)   SpO2 97%   BMI 67.66 kg/m   Gen:  Alert, cooperative patient who appears stated age in no acute distress.  Vital signs reviewed. HEENT: EOMI,  MMM.  No pallor noted.   Cardiac:  Regular rate and rhythm without murmur auscultated.  Good S1/S2. Pulm:  Clear to auscultation bilaterally with good air movement.  No wheezes or rales noted.   Ext:  Nonpitting edema BL ankles.    No results found for this or any previous visit (from the past 72 hour(s)).

## 2017-05-18 NOTE — Patient Instructions (Signed)
It was good to see you today.  We are checking your blood levels and iron as we discussed.  These results will probably back by the time you see the gynecologist tomorrow. Make sure to keep that appointment. If need be we can start you on iron depending on your iron levels.  Come back and see Dr. Earlene PlaterWallace after you have seen the gynecologist and you developed a plan.

## 2017-05-18 NOTE — Assessment & Plan Note (Signed)
-   Has appt with gynecology already scheduled tomorrow.   - To keep this appt - Checking iron and CBC today due to heavy bleeding and fatigued symptoms.   - May need to be restarted on iron pills.  - continue OCPs for now -- may need to change based on gyn recommendations.

## 2017-05-18 NOTE — Progress Notes (Signed)
po

## 2017-05-19 ENCOUNTER — Encounter: Payer: Self-pay | Admitting: Obstetrics & Gynecology

## 2017-05-19 ENCOUNTER — Ambulatory Visit (INDEPENDENT_AMBULATORY_CARE_PROVIDER_SITE_OTHER): Payer: 59 | Admitting: Obstetrics & Gynecology

## 2017-05-19 VITALS — Wt >= 6400 oz

## 2017-05-19 DIAGNOSIS — N921 Excessive and frequent menstruation with irregular cycle: Secondary | ICD-10-CM | POA: Diagnosis not present

## 2017-05-19 DIAGNOSIS — D500 Iron deficiency anemia secondary to blood loss (chronic): Secondary | ICD-10-CM

## 2017-05-19 LAB — CBC
Hematocrit: 35.6 % (ref 34.0–46.6)
Hemoglobin: 11.2 g/dL (ref 11.1–15.9)
MCH: 26.9 pg (ref 26.6–33.0)
MCHC: 31.5 g/dL (ref 31.5–35.7)
MCV: 85 fL (ref 79–97)
Platelets: 343 10*3/uL (ref 150–379)
RBC: 4.17 x10E6/uL (ref 3.77–5.28)
RDW: 14 % (ref 12.3–15.4)
WBC: 9.7 10*3/uL (ref 3.4–10.8)

## 2017-05-19 LAB — FERRITIN: Ferritin: 16 ng/mL (ref 15–150)

## 2017-05-19 LAB — IRON AND TIBC
Iron Saturation: 7 % — CL (ref 15–55)
Iron: 25 ug/dL — ABNORMAL LOW (ref 27–159)
Total Iron Binding Capacity: 363 ug/dL (ref 250–450)
UIBC: 338 ug/dL (ref 131–425)

## 2017-05-19 LAB — TSH: TSH: 1.43 mIU/L

## 2017-05-19 NOTE — Progress Notes (Signed)
    Catherine IlesJansala N Fallen 1981/07/24 [id number]        36 y.o.  G0 Married/Same sex spouse  RP:  Long standing heavy/irregular menses  HPI:  Morbid obesity with long standing heavy and irregular periods.  Not controled in the past with BCPs or Provera.  Endometrial Bx benign proliferative endometrium in 2009.  No current pelvic pain.  Last Hb 11.2 on 05/18/2017.  Pelvic US Endometrial line 7.1 mm on 05/12/2017.  Pap wnl/HPV HR neg 02/2016.  Patient lost 40 Lbs recently.  Past medical history,surgical history, problem list, medications, allergies, family history and social history were all reviewed and documented in the EPIC chart.  Directed ROS with pertinent positives and negatives documented in the history of present illness/assessment and plan.  Exam:  Vitals:   05/19/17 1206  Weight: (!) 434 lb (196.9 kg)   General appearance:  Normal with Morbid Obesity.  Pelvic US 05/12/2017:  Uterus:  Measurements: 9.5 x 5.3 x 6.7 cm. Heterogeneous parenchymal pattern.  No fibroids. Endometrium: Thickness: 7.1 cm.  No focal abnormality visualized. Right ovary:  Measurements: 2.9 x 2.1 x 1.8 cm. Normal appearance/no adnexal mass.  Left ovary:  Measurements: 2.9 x 1.8 x 2.4 cm. Normal appearance/no adnexal mass.  No abnormal free fluid.  Limited exam due to patient's body habitus.   Results for Catherine IlesWALKER, Deazia N (MRN [id number]) as of 05/19/2017 12:24  Ref. Range 05/18/2017 16:11  WBC Latest Ref Range: 3.4 - 10.8 x10E3/uL 9.7  RBC Latest Ref Range: 3.77 - 5.28 x10E6/uL 4.17  Hemoglobin Latest Ref Range: 11.1 - 15.9 g/dL 78.211.2  HCT Latest Ref Range: 34.0 - 46.6 % 35.6  MCV Latest Ref Range: 79 - 97 fL 85  MCH Latest Ref Range: 26.6 - 33.0 pg 26.9  MCHC Latest Ref Range: 31.5 - 35.7 g/dL 95.631.5  RDW Latest Ref Range: 12.3 - 15.4 % 14.0  Platelets Latest Ref Range: 150 - 379 x10E3/uL 343   Assessment/Plan:  36 y.o. G0P0000   1. Menometrorrhagia Probable chronic anovulation.  Recent pelvic US as above 05/12/2017  with normal Endometrial line at 7.1 mm.  Patient has tried BCPs and Provera PO to control heavy bleeding in the past without success.  Other medical hormonal treatments including DepoProvera injections and Mirena IUD discussed in details.  Patient declined medical approaches, desires Endometrial Ablation.  Informed that although an Endometrial Ablation is not contraceptive, it is contraindicated to attempt pregnancy after it is performed.  Patient has no desire to conceive ever in her life.  She is in a same sex marriage and her spouse has 3 children whom they are raising together.  Surgery and risks of Endometrial Ablation discussed.  Will proceed with HSC/D+C/Novasure Endometrial Ablation.  F/U Preop to complete the informed consent and answer all patient's questions after she reads more on the procedure. - Prolactin - TSH - Hemoglobin A1c  2. Iron deficiency anemia due to chronic blood loss Hb stable yesterday at 11.2.  FeSO4 325 qd.  Iron-rich foods.  3. Morbid obesity (HCC) Lost 40 Lbs recently.  Continue healthy low carb/calorie nutrition and regular physical activity.  Counseling on above issues >50% x 30 minutes.  Genia DelMarie-Lyne Kaoir Loree MD, 12:44 PM 05/19/2017

## 2017-05-19 NOTE — Patient Instructions (Signed)
1. Menometrorrhagia Probable chronic anovulation.  Recent pelvic US as above 05/12/2017 with normal Endometrial line at 7.1 mm.  Patient has tried BCPs and Provera PO to control heavy bleeding in the past without success.  Other medical hormonal treatments including DepoProvera injections and Mirena IUD discussed in details.  Patient declined medical approaches, desires Endometrial Ablation.  Informed that although an Endometrial Ablation is not contraceptive, it is contraindicated to attempt pregnancy after it is performed.  Patient has no desire to conceive ever in her life.  She is in a same sex marriage and her spouse has 3 children whom they are raising together.  Surgery and risks of Endometrial Ablation discussed.  F/U Preop to complete the informed consent and answer all patient's questions after she reads more on the procedure. - Prolactin - TSH - Hemoglobin A1c  2. Iron deficiency anemia due to chronic blood loss Hb stable yesterday at 11.2.  FeSO4 325 qd.  Iron-rich foods.  3. Morbid obesity (HCC) Lost 40 Lbs recently.  Continue healthy low carb/calorie nutrition and regular physical activity.  Catherine [name], it was a pleasure to meet you today!  I will inform you of your results as soon as available.   Endometrial Ablation Endometrial ablation is a procedure that destroys the thin inner layer of the lining of the uterus (endometrium). This procedure may be done:  To stop heavy periods.  To stop bleeding that is causing anemia.  To control irregular bleeding.  To treat bleeding caused by small tumors (fibroids) in the endometrium.  This procedure is often an alternative to major surgery, such as removal of the uterus and cervix (hysterectomy). As a result of this procedure:  You may not be able to have children. However, if you are premenopausal (you have not gone through menopause): ? You may still have a small chance of getting pregnant. ? You will need to use a reliable method of  birth control after the procedure to prevent pregnancy.  You may stop having a menstrual period, or you may have only a small amount of bleeding during your period. Menstruation may return several years after the procedure.  Tell a health care provider about:  Any allergies you have.  All medicines you are taking, including vitamins, herbs, eye drops, creams, and over-the-counter medicines.  Any problems you or family members have had with the use of anesthetic medicines.  Any blood disorders you have.  Any surgeries you have had.  Any medical conditions you have. What are the risks? Generally, this is a safe procedure. However, problems may occur, including:  A hole (perforation) in the uterus or bowel.  Infection of the uterus, bladder, or vagina.  Bleeding.  Damage to other structures or organs.  An air bubble in the lung (air embolus).  Problems with pregnancy after the procedure.  Failure of the procedure.  Decreased ability to diagnose cancer in the endometrium.  What happens before the procedure?  You will have tests of your endometrium to make sure there are no pre-cancerous cells or cancer cells present.  You may have an ultrasound of the uterus.  You may be given medicines to thin the endometrium.  Ask your health care provider about: ? Changing or stopping your regular medicines. This is especially important if you take diabetes medicines or blood thinners. ? Taking medicines such as aspirin and ibuprofen. These medicines can thin your blood. Do not take these medicines before your procedure if your doctor tells you not to.  Plan  to have someone take you home from the hospital or clinic. What happens during the procedure?  You will lie on an exam table with your feet and legs supported as in a pelvic exam.  To lower your risk of infection: ? Your health care team will wash or sanitize their hands and put on germ-free (sterile) gloves. ? Your genital  area will be washed with soap.  An IV tube will be inserted into one of your veins.  You will be given a medicine to help you relax (sedative).  A surgical instrument with a light and camera (resectoscope) will be inserted into your vagina and moved into your uterus. This allows your surgeon to see inside your uterus.  Endometrial tissue will be removed using one of the following methods: ? Radiofrequency. This method uses a radiofrequency-alternating electric current to remove the endometrium. ? Cryotherapy. This method uses extreme cold to freeze the endometrium. ? Heated-free liquid. This method uses a heated saltwater (saline) solution to remove the endometrium. ? Microwave. This method uses high-energy microwaves to heat up the endometrium and remove it. ? Thermal balloon. This method involves inserting a catheter with a balloon tip into the uterus. The balloon tip is filled with heated fluid to remove the endometrium. The procedure may vary among health care providers and hospitals. What happens after the procedure?  Your blood pressure, heart rate, breathing rate, and blood oxygen level will be monitored until the medicines you were given have worn off.  As tissue healing occurs, you may notice vaginal bleeding for 4-6 weeks after the procedure. You may also experience: ? Cramps. ? Thin, watery vaginal discharge that is light pink or brown in color. ? A need to urinate more frequently than usual. ? Nausea.  Do not drive for 24 hours if you were given a sedative.  Do not have sex or insert anything into your vagina until your health care provider approves. Summary  Endometrial ablation is done to treat the many causes of heavy menstrual bleeding.  The procedure may be done only after medications have been tried to control the bleeding.  Plan to have someone take you home from the hospital or clinic. This information is not intended to replace advice given to you by your  health care provider. Make sure you discuss any questions you have with your health care provider. Document Released: 08/08/2004 Document Revised: 10/16/2016 Document Reviewed: 10/16/2016 Elsevier Interactive Patient Education  2017 [employer].

## 2017-05-20 ENCOUNTER — Ambulatory Visit: Payer: 59 | Admitting: Physical Therapy

## 2017-05-20 ENCOUNTER — Encounter: Payer: Self-pay | Admitting: Physical Therapy

## 2017-05-20 DIAGNOSIS — M62830 Muscle spasm of back: Secondary | ICD-10-CM

## 2017-05-20 DIAGNOSIS — M5441 Lumbago with sciatica, right side: Secondary | ICD-10-CM | POA: Diagnosis not present

## 2017-05-20 DIAGNOSIS — G8929 Other chronic pain: Secondary | ICD-10-CM | POA: Diagnosis not present

## 2017-05-20 LAB — PROLACTIN: Prolactin: 6.1 ng/mL

## 2017-05-20 LAB — HEMOGLOBIN A1C
Hgb A1c MFr Bld: 5.2 % (ref <5.7)
Mean Plasma Glucose: 103 mg/dL

## 2017-05-20 NOTE — Therapy (Signed)
[facility] [address] [place], [place], [address] Phone: [phone]   Fax:  [phone]  Physical Therapy Treatment  Patient Details  Name: Catherine IlesJansala N [name] MRN: [id number] Date of Birth: May 29, 1981 Referring Provider: Lisbeth RenshawNeelesh, Nundkumar MD   Encounter Date: 05/20/2017      PT End of Session - 05/20/17 0803    Visit Number 2   Number of Visits 16   Date for PT Re-Evaluation 07/09/17   PT Start Time 0730   PT Stop Time 0813   PT Time Calculation (min) 43 min   Activity Tolerance Patient tolerated treatment well   Behavior During Therapy [facility] for tasks assessed/performed      Past Medical History:  Diagnosis Date  . Allergy   . Anxiety   . Arthritis   . Asthma    since baby  . ASTHMA, UNSPECIFIED 12/10/2006   Qualifier: Diagnosis of  By: Abundio MiuMcGregor, Barbara    . CELLULITIS AND ABSCESS OF LEG EXCEPT FOOT 12/30/2007   Qualifier: Diagnosis of  By: Daphine DeutscherMartin FNP, Zena AmosNykedtra    . DEPENDENT EDEMA, LEGS, BILATERAL 11/22/2008   Qualifier: Diagnosis of  By: Barbaraann Barthelankins MD, [place]    . Depression 2003Dx  . Edema   . Food allergy    citris  . FOOT PAIN, RIGHT 11/22/2008   Qualifier: Diagnosis of  By: Barbaraann Barthelankins MD, [place]    . Gastric ulcer   . GERD (gastroesophageal reflux disease)   . Gout   . Hypertension   . Joint pain   . KNEE INJURY, LEFT 01/01/2009   Qualifier: Diagnosis of  By: Delrae AlfredMulberry MD, Lanora ManisElizabeth    . Migraine   . ONYCHOMYCOSIS, TOENAILS 11/22/2008   Qualifier: Diagnosis of  By: Barbaraann Barthelankins MD, [place]    . Shortness of breath   . Sleep apnea   . TINEA PEDIS 11/22/2008   Qualifier: Diagnosis of  By: Barbaraann Barthelankins MD, [place]      Past Surgical History:  Procedure Laterality Date  . WISDOM TOOTH EXTRACTION  2008   x4    There were no vitals filed for this visit.      Subjective Assessment - 05/20/17 0757    Subjective I am less stiff when standing after work since I have been doing the exercises.  I have been approved  for the ablation surgery.  Some of my back pain may be due to that.   Currently in Pain? Yes   Pain Score 5    Pain Location Back   Pain Orientation Right   Pain Descriptors / Indicators Aching   Pain Type Chronic pain   Pain Radiating Towards right leg sometimes   Aggravating Factors  sitting , standing in one position too long,  bending to the right   Pain Relieving Factors exercises, stretches,  heat                         [facility] Adult PT Treatment/Exercise - 05/20/17 0001      Self-Care   Self-Care --  sitting posture Lumbar support,  sitting with magazine Left     Modalities   Modalities Moist Heat     Moist Heat Therapy   Number Minutes Moist Heat 15 Minutes   Moist Heat Location Lumbar Spine  right     Manual Therapy   Manual Therapy Soft tissue mobilization  right gluteal and low back lateral thigh proximal. light   Manual therapy comments Gentle LAD to left leg to  decrease compression on S1 nerve root.                 PT Education - 05/20/17 0802    Education provided Yes   Education Details Sitting posture   Person(s) Educated Patient   Methods Explanation   Comprehension Verbalized understanding          PT Short Term Goals - 05/14/17 1007      PT SHORT TERM GOAL #1   Title Patient will demsotrate a good core contraction    Time 4   Period Weeks   Status New     PT SHORT TERM GOAL #2   Title Patient will demsotrate 5/5 bilateral lower extremity strength    Time 4   Period Weeks   Status New     PT SHORT TERM GOAL #3   Title Patient will report centralized lower back pain with no radiation on the right    Time 4   Period Weeks   Status New     PT SHORT TERM GOAL #4   Title Patient will be independent with basic HEP    Time 4   Period Weeks   Status New           PT Long Term Goals - 05/14/17 1012      PT LONG TERM GOAL #1   Title Patient will sit for 2 hours at work without increased pain and radiation.     Time 8   Period Weeks   Status New   Target Date 07/09/17     PT LONG TERM GOAL #2   Title Patient will walk 3000' without increased pain in order to perfrom IADL's    Time 8   Period Weeks   Status New   Target Date 07/09/17     PT LONG TERM GOAL #3   Title Patient will be independent with basic HEP for core stability    Time 8   Period Weeks   Status New   Target Date 07/09/17               Plan - 05/20/17 0803    Clinical Impression Statement Less pain with manua;l.  beginning posture ed.   PT Treatment/Interventions ADLs/Self Care Home Management;Cryotherapy;Electrical Stimulation;Iontophoresis 4mg /ml Dexamethasone;Gait training;Stair training;Ultrasound;Therapeutic activities;Therapeutic exercise;Patient/family education;Passive range of motion;Manual techniques;Taping;Prosthetic Training;Dry needling;Neuromuscular re-education;Moist Heat   PT Next Visit Plan good response to LAD; continue manual therapy to lumbar spine if able; Patient hadsome pain lying on the table to perform core stability exercises eaither sittingor stnading. Pateint may benefit from light physioball stretches wiht the physio ball   PT Home Exercise Plan side bend away from right; seated hamstring stretch, seated clamshell with abdominal brace    Consulted and Agree with Plan of Care Patient      Patient will benefit from skilled therapeutic intervention in order to improve the following deficits and impairments:     Visit Diagnosis: Chronic right-sided low back pain with right-sided sciatica  Muscle spasm of back     Problem List Patient Active Problem List   Diagnosis Date Noted  . Menorrhagia 05/18/2017  . Sacral radiculopathy 02/04/2017  . Insulin resistance 02/02/2017  . At risk for violence to self related to depression 02/02/2017  . Morbid obesity (HCC) 01/01/2017  . Sore throat (viral) 11/21/2016  . Vitamin D deficiency 11/12/2016  . Prediabetes 11/12/2016  . Asthma  07/25/2016  . Abdominal pain 06/04/2016  . Otitis, externa, infective 06/04/2016  .  Dysuria 03/20/2016  . Gout 11/21/2015  . Obesity, morbid, BMI 50 or higher (HCC) 05/14/2015  . BIPOLAR DISORDER UNSPECIFIED 11/22/2008  . Depression 12/10/2006  . MIGRAINE, UNSPEC., W/O INTRACTABLE MIGRAINE 12/10/2006  . GASTROESOPHAGEAL REFLUX, NO ESOPHAGITIS 12/10/2006  . OSA (obstructive sleep apnea) 12/10/2006    Naelle Diegel PTA 05/20/2017, 8:05 AM  [facility] [address] [place], [place], [address] Phone: [phone]   Fax:  [phone]  Name: GRACLYN LAWTHER MRN: [id number] Date of Birth: 1981-05-06

## 2017-05-21 ENCOUNTER — Telehealth: Payer: Self-pay

## 2017-05-21 ENCOUNTER — Telehealth: Payer: Self-pay | Admitting: Internal Medicine

## 2017-05-21 NOTE — Telephone Encounter (Signed)
I called patient to let her know I received her FMLA forms. Advised her I will need signed medical records release form and $25 fee paid before I can send them. She said she will come by tomorrow and take care of both.

## 2017-05-21 NOTE — Telephone Encounter (Signed)
Pt would like to speak to her doctor about the FMLA papers that were faxed to us. [facility] to this and isn't what the next steps are supposed to be. Please call her to discuss. jw

## 2017-05-22 MED FILL — MONO-LINYAH 28 TABLET: 0.25-35 | 28 days supply | Qty: 28 | Fill #1

## 2017-05-25 ENCOUNTER — Ambulatory Visit: Payer: 59 | Admitting: Physical Therapy

## 2017-05-25 DIAGNOSIS — G8929 Other chronic pain: Secondary | ICD-10-CM

## 2017-05-25 DIAGNOSIS — M5441 Lumbago with sciatica, right side: Principal | ICD-10-CM

## 2017-05-25 DIAGNOSIS — M62830 Muscle spasm of back: Secondary | ICD-10-CM | POA: Diagnosis not present

## 2017-05-25 NOTE — Therapy (Signed)
[facility] [address] [place], [place], [address] Phone: [phone]   Fax:  [phone]  Physical Therapy Treatment  Patient Details  Name: Catherine IlesJansala N Haralson MRN: [id number] Date of Birth: [birth year] Referring Provider: Lisbeth RenshawNeelesh, Nundkumar MD   Encounter Date: 05/25/2017      PT End of Session - 05/25/17 0809    Visit Number 3   Number of Visits 16   Date for PT Re-Evaluation 07/09/17   Authorization Type [facility]    PT Start Time 0806   PT Stop Time 0845   PT Time Calculation (min) 39 min      Past Medical History:  Diagnosis Date  . Allergy   . Anxiety   . Arthritis   . Asthma    since baby  . ASTHMA, UNSPECIFIED 12/10/2006   Qualifier: Diagnosis of  By: Abundio MiuMcGregor, Barbara    . CELLULITIS AND ABSCESS OF LEG EXCEPT FOOT 12/30/2007   Qualifier: Diagnosis of  By: Daphine DeutscherMartin FNP, Zena AmosNykedtra    . DEPENDENT EDEMA, LEGS, BILATERAL 11/22/2008   Qualifier: Diagnosis of  By: Barbaraann Barthelankins MD, [place]    . Depression 2003Dx  . Edema   . Food allergy    citris  . FOOT PAIN, RIGHT 11/22/2008   Qualifier: Diagnosis of  By: Barbaraann Barthelankins MD, [place]    . Gastric ulcer   . GERD (gastroesophageal reflux disease)   . Gout   . Hypertension   . Joint pain   . KNEE INJURY, LEFT 01/01/2009   Qualifier: Diagnosis of  By: Delrae AlfredMulberry MD, Lanora ManisElizabeth    . Migraine   . ONYCHOMYCOSIS, TOENAILS 11/22/2008   Qualifier: Diagnosis of  By: Barbaraann Barthelankins MD, [place]    . Shortness of breath   . Sleep apnea   . TINEA PEDIS 11/22/2008   Qualifier: Diagnosis of  By: Barbaraann Barthelankins MD, [place]      Past Surgical History:  Procedure Laterality Date  . WISDOM TOOTH EXTRACTION  2008   x4    There were no vitals filed for this visit.      Subjective Assessment - 05/25/17 0808    Subjective Pain is not bothering me as much at night.    Currently in Pain? Yes   Pain Score 7    Pain Location Back   Pain Orientation Right;Left   Pain Descriptors / Indicators Aching   Pain Radiating Towards to bilateral lateral hips    Aggravating Factors  increased walking    Pain Relieving Factors exercises, stretces, heat                          [facility] Adult PT Treatment/Exercise - 05/25/17 0001      Lumbar Exercises: Stretches   Passive Hamstring Stretch Limitations seated hamstring stretch 2x30sec hold   bilateral    Pelvic Tilt 10 seconds   Quadruped Mid Back Stretch Limitations seated childs pose with physioball forward and lateral.      Lumbar Exercises: Seated   LAQ on Chair Limitations 10 x 2 with abdominal brace    Hip Flexion on Ball Limitations seated march green band with abdominal brace.    Other Seated Lumbar Exercises seated clam green  band with abdominal bracing 2x10; reviewed seated PPT x10;      Lumbar Exercises: Supine   Ab Set 10 reps  feet in chair on mat table.    AB Set Limitations physioball on abdomen , then obliques x 10 each  Bridge 10 reps   Bridge Limitations with initial pelvic tilt    Straight Leg Raise 10 reps  both    Other Supine Lumbar Exercises supine decompression position with legs in chair, then leg lengtheners from hooklying      Manual Therapy   Manual therapy comments Gentle LAD to left leg to decrease compression on S1 nerve root.                   PT Short Term Goals - 05/14/17 1007      PT SHORT TERM GOAL #1   Title Patient will demsotrate a good core contraction    Time 4   Period Weeks   Status New     PT SHORT TERM GOAL #2   Title Patient will demsotrate 5/5 bilateral lower extremity strength    Time 4   Period Weeks   Status New     PT SHORT TERM GOAL #3   Title Patient will report centralized lower back pain with no radiation on the right    Time 4   Period Weeks   Status New     PT SHORT TERM GOAL #4   Title Patient will be independent with basic HEP    Time 4   Period Weeks   Status New           PT Long Term Goals - 05/14/17 1012      PT LONG TERM  GOAL #1   Title Patient will sit for 2 hours at work without increased pain and radiation.    Time 8   Period Weeks   Status New   Target Date 07/09/17     PT LONG TERM GOAL #2   Title Patient will walk 3000' without increased pain in order to perfrom IADL's    Time 8   Period Weeks   Status New   Target Date 07/09/17     PT LONG TERM GOAL #3   Title Patient will be independent with basic HEP for core stability    Time 8   Period Weeks   Status New   Target Date 07/09/17               Plan - 05/25/17 1610    Clinical Impression Statement LBP 7/10 and radicular pain to bilateral lateral hips. No pain in right calf. Pt reports sleeping tolerance improved, able to sleep on right side. Able to climb stairs without handrail and using cane. Able to sit 30-45 minutes at work with short standing breaks.  Tolerated seated physioball stretches well.  Progressed seated core and began supine core with good tolerance. Pt declined HMP and reports 0/10 LBP and only left lateral hip pain she attributes to muscle pain from increased daily walking. Pt would like to try Nustep/ gym machines.    PT Next Visit Plan Good tolerance to hooklying core/decompression exercises today, seated physioball stretches, check weight limit on Nustep? manual as needed/ LAD    PT Home Exercise Plan side bend away from right; seated hamstring stretch, seated clamshell with abdominal brace    Consulted and Agree with Plan of Care Patient      Patient will benefit from skilled therapeutic intervention in order to improve the following deficits and impairments:  Abnormal gait, Decreased activity tolerance, Decreased strength, Pain, Obesity, Increased muscle spasms, Decreased range of motion  Visit Diagnosis: Chronic right-sided low back pain with right-sided sciatica  Muscle spasm of back  Problem List Patient Active Problem List   Diagnosis Date Noted  . Menorrhagia 05/18/2017  . Sacral radiculopathy  02/04/2017  . Insulin resistance 02/02/2017  . At risk for violence to self related to depression 02/02/2017  . Morbid obesity (HCC) 01/01/2017  . Sore throat (viral) 11/21/2016  . Vitamin D deficiency 11/12/2016  . Prediabetes 11/12/2016  . Asthma 07/25/2016  . Abdominal pain 06/04/2016  . Otitis, externa, infective 06/04/2016  . Dysuria 03/20/2016  . Gout 11/21/2015  . Obesity, morbid, BMI 50 or higher (HCC) 05/14/2015  . BIPOLAR DISORDER UNSPECIFIED 11/22/2008  . Depression 12/10/2006  . MIGRAINE, UNSPEC., W/O INTRACTABLE MIGRAINE 12/10/2006  . GASTROESOPHAGEAL REFLUX, NO ESOPHAGITIS 12/10/2006  . OSA (obstructive sleep apnea) 12/10/2006    Sherrie Mustache, PTA 05/25/2017, 8:49 AM  [facility] [address] [place], [place], [address] Phone: [phone]   Fax:  [phone]  Name: TEONNA COONAN MRN: [id number] Date of Birth: [birth year]

## 2017-05-27 ENCOUNTER — Encounter: Payer: Self-pay | Admitting: Anesthesiology

## 2017-05-27 ENCOUNTER — Ambulatory Visit (INDEPENDENT_AMBULATORY_CARE_PROVIDER_SITE_OTHER): Payer: 59 | Admitting: Family Medicine

## 2017-05-27 VITALS — BP 123/77 | HR 92 | Temp 97.8°F | Ht 67.0 in | Wt >= 6400 oz

## 2017-05-27 DIAGNOSIS — E669 Obesity, unspecified: Secondary | ICD-10-CM

## 2017-05-27 DIAGNOSIS — E559 Vitamin D deficiency, unspecified: Secondary | ICD-10-CM

## 2017-05-27 DIAGNOSIS — Z6841 Body Mass Index (BMI) 40.0 and over, adult: Secondary | ICD-10-CM

## 2017-05-27 DIAGNOSIS — IMO0001 Reserved for inherently not codable concepts without codable children: Secondary | ICD-10-CM

## 2017-05-27 NOTE — Progress Notes (Signed)
Office: [phone]  /  Fax: [phone]   HPI:   Chief Complaint: OBESITY Catherine [name] is here to discuss her progress with her obesity treatment plan. She is on the Category 3 plan and is following her eating plan approximately 87 % of the time. She states she is exercising 0 minutes 0 times per week. [name] continues to do well with weight loss. She plans ahead well and follows the plan. She noticed great improvement with the swelling of her feet.  Her weight is (!) 423 lb (191.9 kg) today and has had a weight loss of 7 pounds over a period of 2 weeks since her last visit. She has lost 45 lbs since starting treatment with [name].  Vitamin D deficiency Catherine [name] has a diagnosis of vitamin D deficiency. She is currently taking vit D and denies nausea, vomiting or muscle weakness.   ALLERGIES: Allergies  Allergen Reactions  . Citrus Swelling    MEDICATIONS: Current Outpatient Prescriptions on File Prior to Visit  Medication Sig Dispense Refill  . aspirin-acetaminophen-caffeine (EXCEDRIN MIGRAINE) 250-250-65 MG per tablet Take 1 tablet by mouth 2 (two) times daily as needed for migraine. For headache    . budesonide (PULMICORT) 180 MCG/ACT inhaler Inhale 1 puff into the lungs 2 (two) times daily. 1 Inhaler 1  . buPROPion (WELLBUTRIN SR) 200 MG 12 hr tablet Take 1 tablet (200 mg total) by mouth every morning. 30 tablet 0  . cetirizine (ZYRTEC) 10 MG tablet Take 1 tablet (10 mg total) by mouth daily. 30 tablet 2  . diclofenac sodium (VOLTAREN) 1 % GEL Apply 2 g topically 4 (four) times daily. 100 g 1  . furosemide (LASIX) 20 MG tablet TAKE 1 TABLET (20 MG TOTAL) BY MOUTH DAILY. 30 tablet 1  . gabapentin (NEURONTIN) 300 MG capsule Take 2 capsules (600 mg total) by mouth 3 (three) times daily. (Patient taking differently: Take 900 mg by mouth 3 (three) times daily. ) 180 capsule 3  . ibuprofen (ADVIL,MOTRIN) 200 MG tablet Take 200 mg by mouth every 6 (six) hours as needed for moderate pain.      . norgestimate-ethinyl estradiol (ORTHO-CYCLEN,SPRINTEC,PREVIFEM) 0.25-35 MG-MCG tablet Take 1 tablet by mouth daily. 1 Package 11  . ranitidine (ZANTAC) 150 MG tablet TAKE 1 TABLET BY MOUTH 2 TIMES DAILY. 60 tablet 0  . Vitamin D, Ergocalciferol, (DRISDOL) 50000 units CAPS capsule Take 1 capsule (50,000 Units total) by mouth every 7 (seven) days. 4 capsule 0   No current facility-administered medications on file prior to visit.     PAST MEDICAL HISTORY: Past Medical History:  Diagnosis Date  . Allergy   . Anxiety   . Arthritis   . Asthma    since baby  . ASTHMA, UNSPECIFIED 12/10/2006   Qualifier: Diagnosis of  By: Abundio Miu    . CELLULITIS AND ABSCESS OF LEG EXCEPT FOOT 12/30/2007   Qualifier: Diagnosis of  By: Daphine Deutscher FNP, Zena Amos    . DEPENDENT EDEMA, LEGS, BILATERAL 11/22/2008   Qualifier: Diagnosis of  By: Barbaraann Barthel MD, [place]    . Depression 2003Dx  . Edema   . Food allergy    citris  . FOOT PAIN, RIGHT 11/22/2008   Qualifier: Diagnosis of  By: Barbaraann Barthel MD, [place]    . Gastric ulcer   . GERD (gastroesophageal reflux disease)   . Gout   . Hypertension   . Joint pain   . KNEE INJURY, LEFT 01/01/2009   Qualifier: Diagnosis of  By: Delrae Alfred MD, Lanora Manis    .  Migraine   . ONYCHOMYCOSIS, TOENAILS 11/22/2008   Qualifier: Diagnosis of  By: Barbaraann Barthel MD, [place]    . Shortness of breath   . Sleep apnea   . TINEA PEDIS 11/22/2008   Qualifier: Diagnosis of  By: Barbaraann Barthel MD, Benetta Spar      PAST SURGICAL HISTORY: Past Surgical History:  Procedure Laterality Date  . WISDOM TOOTH EXTRACTION  2008   x4    SOCIAL HISTORY: Social History  Substance Use Topics  . Smoking status: Former Smoker    Packs/day: 0.50    Years: 18.00    Types: Cigarettes    Start date: 10/13/1996    Quit date: 01/12/2015  . Smokeless tobacco: Never Used  . Alcohol use 0.6 oz/week    1 Standard drinks or equivalent per week     Comment: rarely drinks    FAMILY HISTORY: Family History   Problem Relation Age of Onset  . Hypertension Mother   . Hyperlipidemia Mother   . Cancer Mother        bone cancer  . Heart disease Mother   . Sudden death Mother   . Stroke Mother   . Thyroid disease Mother   . Sleep apnea Mother   . Obesity Mother   . Sudden death Father   . Hypertension Father   . Heart disease Father   . Hyperlipidemia Father   . Prostate cancer Father   . Lung cancer Father   . Obesity Father   . Alcoholism Father   . Sudden death Brother   . Hypertension Brother   . Hyperlipidemia Brother   . Heart attack Brother   . Heart disease Brother   . Stroke Brother   . Alcoholism Brother   . Drug abuse Brother   . Heart disease Sister   . Hypertension Sister   . Thyroid cancer Sister   . Diabetes Neg Hx   . Colon cancer Neg Hx   . Stomach cancer Neg Hx   . Esophageal cancer Neg Hx   . Rectal cancer Neg Hx   . Liver cancer Neg Hx     ROS: Review of Systems  Constitutional: Positive for weight loss.  Gastrointestinal: Negative for nausea and vomiting.  Musculoskeletal:       Negative muscle weakness     PHYSICAL EXAM: Blood pressure 123/77, pulse 92, temperature 97.8 F (36.6 C), temperature source Oral, height 5\' 7"  (1.702 m), weight (!) 423 lb (191.9 kg), SpO2 97 %. Body mass index is 66.25 kg/m. Physical Exam  Constitutional: She is oriented to person, place, and time. She appears well-developed and well-nourished.  Cardiovascular: Normal rate.   Pulmonary/Chest: Effort normal.  Musculoskeletal: Normal range of motion.  Neurological: She is oriented to person, place, and time.  Skin: Skin is warm and dry.  Psychiatric: She has a normal mood and affect. Her behavior is normal.  Vitals reviewed.   RECENT LABS AND TESTS: BMET    Component Value Date/Time   NA 140 01/20/2017 0831   K 4.4 01/20/2017 0831   CL 102 01/20/2017 0831   CO2 23 01/20/2017 0831   GLUCOSE 80 01/20/2017 0831   GLUCOSE 83 06/04/2016 1155   BUN 16 01/20/2017  0831   CREATININE 0.74 01/20/2017 0831   CREATININE 0.90 06/04/2016 1155   CALCIUM 9.3 01/20/2017 0831   GFRNONAA 105 01/20/2017 0831   GFRNONAA 84 06/04/2016 1155   GFRAA 121 01/20/2017 0831   GFRAA >89 06/04/2016 1155   Lab Results  Component Value  Date   HGBA1C 5.2 05/19/2017   HGBA1C 5.4 01/20/2017   HGBA1C 5.6 10/27/2016   HGBA1C 5.0 11/21/2015   Lab Results  Component Value Date   INSULIN 29.7 (H) 01/20/2017   INSULIN 30.1 (H) 10/27/2016   CBC    Component Value Date/Time   WBC 9.7 05/18/2017 1611   WBC 13.4 (H) 06/04/2016 1155   RBC 4.17 05/18/2017 1611   RBC 4.37 06/04/2016 1155   HGB 11.2 05/18/2017 1611   HCT 35.6 05/18/2017 1611   PLT 343 05/18/2017 1611   MCV 85 05/18/2017 1611   MCH 26.9 05/18/2017 1611   MCH 27.7 06/04/2016 1155   MCHC 31.5 05/18/2017 1611   MCHC 32.4 06/04/2016 1155   RDW 14.0 05/18/2017 1611   LYMPHSABS 2.9 01/20/2017 0831   MONOABS 1.3 (H) 09/21/2013 1635   EOSABS 0.1 01/20/2017 0831   BASOSABS 0.0 01/20/2017 0831   Iron/TIBC/Ferritin/ %Sat    Component Value Date/Time   IRON 25 (L) 05/18/2017 1611   TIBC 363 05/18/2017 1611   FERRITIN 16 05/18/2017 1611   IRONPCTSAT 7 (LL) 05/18/2017 1611   Lipid Panel     Component Value Date/Time   CHOL 152 10/27/2016 1058   TRIG 57 10/27/2016 1058   HDL 43 10/27/2016 1058   CHOLHDL 3.6 11/21/2015 1047   VLDL 10 11/21/2015 1047   LDLCALC 98 10/27/2016 1058   Hepatic Function Panel     Component Value Date/Time   PROT 6.6 01/20/2017 0831   ALBUMIN 3.9 01/20/2017 0831   AST 21 01/20/2017 0831   ALT 19 01/20/2017 0831   ALKPHOS 65 01/20/2017 0831   BILITOT 0.3 01/20/2017 0831      Component Value Date/Time   TSH 1.43 05/19/2017 1245   TSH 1.460 10/27/2016 1058   TSH 0.96 11/21/2015 1047    ASSESSMENT AND PLAN: Vitamin D deficiency  Class 3 obesity with serious comorbidity and body mass index (BMI) of 60.0 to 69.9 in adult, unspecified obesity type  (HCC)  PLAN:  Vitamin D Deficiency Laylynn was informed that low vitamin D levels contributes to fatigue and are associated with obesity, breast, and colon cancer. She agrees to continue to take prescription Vit D @50 ,000 IU every week and will follow up for routine testing of vitamin D, at least 2-3 times per year. She was informed of the risk of over-replacement of vitamin D and agrees to not increase her dose unless he discusses this with us first.  We spent > than 50% of the 15 minute visit on the counseling as documented in the note.  Obesity [name] is currently in the action stage of change. As such, her goal is to continue with weight loss efforts She has agreed to follow the Category 3 plan [name] has been instructed to work up to a goal of 150 minutes of combined cardio and strengthening exercise per week for weight loss and overall health benefits. We discussed the following Behavioral Modification Strategies today: meal planning & cooking strategies and increasing lean protein intake  [name] has agreed to follow up with our clinic in 2 weeks. She was informed of the importance of frequent follow up visits to maximize her success with intensive lifestyle modifications for her multiple health conditions.  I, [place] CraneJoanne Murray, am acting as transcriptionist for Illa LevelSahar Katrena Stehlin, PA-C  I have reviewed the above documentation for accuracy and completeness, and I agree with the above. -Illa LevelSahar Shigeru Lampert, PA-C  I have reviewed the above note and agree with the plan. -  Quillian Quince, MD      OBESITY BEHAVIORAL INTERVENTION VISIT  Today's visit was # 14 out of 22.  Starting weight: 468 lbs Starting date: 10/27/16 Today's weight : 423 lbs  Today's date: 05/27/2017 Total lbs lost to date: 45 (Patients must lose 7 lbs in the first 6 months to continue with counseling)   ASK: We discussed the diagnosis of obesity with [name] today and Dulcinea agreed to give [name] permission to discuss  obesity behavioral modification therapy today.  ASSESS: Tailyn has the diagnosis of obesity and her BMI today is 55.4 Rebeca is in the action stage of change   ADVISE: Elliyah was educated on the multiple health risks of obesity as well as the benefit of weight loss to improve her health. She was advised of the need for long term treatment and the importance of lifestyle modifications.  AGREE: Multiple dietary modification options and treatment options were discussed and Skarlett agreed to follow the Category 3 plan We discussed the following Behavioral Modification Strategies today: meal planning & cooking strategies and increasing lean protein intake

## 2017-05-27 NOTE — Telephone Encounter (Signed)
If the FMLA is related to her plan to have endometrial ablation for uterine bleeding she will need to have their office complete the paperwork.   Marcy Sirenatherine Desaray Marschner, D.O. 05/27/2017, 9:08 AM PGY-3, [facility]

## 2017-05-28 ENCOUNTER — Ambulatory Visit: Payer: 59 | Admitting: Physical Therapy

## 2017-05-28 MED FILL — VIT D2 1.25 MG (50,000 UNIT: 1.25 MG | 7 days supply | Qty: 4 | Fill #0

## 2017-05-28 MED FILL — BUPROPION HCL SR 200 MG TAB: 200 | 30 days supply | Qty: 30 | Fill #0

## 2017-05-28 NOTE — Telephone Encounter (Signed)
Patient called nurse line to inquire about FMLA.  Advised of the message from Dr. Earlene PlaterWallace, pt is amendable and agreeable and wanted to let Dr. Earlene PlaterWallace know that she can disregard that form as she has already sent one to her OB/GYN . Fleeger, Maryjo RochesterJessica Dawn, CMA

## 2017-06-01 ENCOUNTER — Ambulatory Visit (INDEPENDENT_AMBULATORY_CARE_PROVIDER_SITE_OTHER): Payer: 59 | Admitting: Obstetrics & Gynecology

## 2017-06-01 ENCOUNTER — Encounter: Payer: Self-pay | Admitting: Obstetrics & Gynecology

## 2017-06-01 ENCOUNTER — Ambulatory Visit: Payer: 59 | Admitting: Physical Therapy

## 2017-06-01 DIAGNOSIS — N921 Excessive and frequent menstruation with irregular cycle: Secondary | ICD-10-CM | POA: Diagnosis not present

## 2017-06-01 NOTE — Progress Notes (Signed)
    Catherine [name] July 28, 1981 [id number]        36 y.o.  G0 Same Sex Partner  RP:  Preop HSC/D+C/Novasure Endometrial Ablation  HPI:  Not currently having Vaginal bleeding.  No pelvic pain.  Past medical history,surgical history, problem list, medications, allergies, family history and social history were all reviewed and documented in the EPIC chart.  Directed ROS with pertinent positives and negatives documented in the history of present illness/assessment and plan.  Exam:  There were no vitals filed for this visit. General appearance:  Normal Morbid Obesity  Pelvic US 05/12/2017:  Uterus:  Measurements: 9.5 x 5.3 x 6.7 cm. Heterogeneous parenchymal pattern.  No fibroids. Endometrium: Thickness: 7.1 cm. No focal abnormality visualized. Right ovary:  Measurements: 2.9 x 2.1 x 1.8 cm. Normal appearance/no adnexal mass.  Left ovary:  Measurements: 2.9 x 1.8 x 2.4 cm. Normal appearance/no adnexal mass.  No abnormal free fluid. Limited exam due to patient's body habitus.  Assessment/Plan:  36 y.o. G0  1. Menometrorrhagia Long standing Menometrorrhagia resistant to Hormonal treatment with secondary anemia.  Last Hb 05/18/17 at 11.2.  Decision to proceed with HSC/D+C/Novasure Endometrial Ablation.  Surgery/Risks/Postop reviewed.                        Patient was counseled as to the risk of surgery to include the following:  1. Infection (prohylactic antibiotics will be administered)  2. DVT/Pulmonary Embolism (prophylactic pneumo compression stockings will be used)  3. Uterine Perforation with risk of trauma to internal organs requiring additional surgical procedure to repair any injury to     Internal organs requiring perhaps additional hospitalization days.  4.Hemmorhage requiring transfusion and blood products which carry risks such as anaphylactic reaction, hepatitis and AIDS  Patient had received literature information on the procedure scheduled and all her questions were  answered and fully accepts all risk.   [place] [id number] PMTD     Genia Del MD, 2:45 PM 06/01/2017

## 2017-06-01 NOTE — Patient Instructions (Signed)
1. Menometrorrhagia Long standing Menometrorrhagia resistant to Hormonal treatment with secondary anemia.  Last Hb 05/18/17 at 11.2.  Decision to proceed with HSC/D+C/Novasure Endometrial Ablation.  Surgery/Risks/Postop reviewed.                        Patient was counseled as to the risk of surgery to include the following:  1. Infection (prohylactic antibiotics will be administered)  2. DVT/Pulmonary Embolism (prophylactic pneumo compression stockings will be used)  3. Uterine Perforation with risk of trauma to internal organs requiring additional surgical procedure to repair any injury to     Internal organs requiring perhaps additional hospitalization days.  4.Hemmorhage requiring transfusion and blood products which carry risks such as anaphylactic reaction, hepatitis and AIDS  Patient had received literature information on the procedure scheduled and all her questions were answered and fully accepts all risk.

## 2017-06-03 ENCOUNTER — Encounter: Payer: 59 | Admitting: Physical Therapy

## 2017-06-03 ENCOUNTER — Ambulatory Visit: Payer: 59 | Admitting: Physical Therapy

## 2017-06-03 DIAGNOSIS — M5441 Lumbago with sciatica, right side: Secondary | ICD-10-CM | POA: Diagnosis not present

## 2017-06-03 DIAGNOSIS — G8929 Other chronic pain: Secondary | ICD-10-CM

## 2017-06-03 DIAGNOSIS — Z0289 Encounter for other administrative examinations: Secondary | ICD-10-CM

## 2017-06-03 DIAGNOSIS — M62830 Muscle spasm of back: Secondary | ICD-10-CM

## 2017-06-03 NOTE — Therapy (Signed)
[place], [place], [address] Phone: [phone]   Fax:  [phone]  Physical Therapy Treatment  Patient Details  Name: Catherine [name] MRN: [id number] Date of Birth: 1981/07/10 Referring Provider: Consuella Lose MD   Encounter Date: 06/03/2017      PT End of Session - 06/03/17 0806    Visit Number 4   Number of Visits 16   Date for PT Re-Evaluation 07/09/17   Authorization Type [facility]    PT Start Time 0800   PT Stop Time 0845   PT Time Calculation (min) 45 min      Past Medical History:  Diagnosis Date  . Allergy   . Anxiety   . Arthritis   . Asthma    since baby  . ASTHMA, UNSPECIFIED 12/10/2006   Qualifier: Diagnosis of  By: Eusebio Friendly    . CELLULITIS AND ABSCESS OF LEG EXCEPT FOOT 12/30/2007   Qualifier: Diagnosis of  By: Hassell Done FNP, Tori Milks    . DEPENDENT EDEMA, LEGS, BILATERAL 11/22/2008   Qualifier: Diagnosis of  By: Radene Ou MD, [place]    . Depression 2003Dx  . Edema   . Food allergy    citris  . FOOT PAIN, RIGHT 11/22/2008   Qualifier: Diagnosis of  By: Radene Ou MD, [place]    . Gastric ulcer   . GERD (gastroesophageal reflux disease)   . Gout   . Hypertension   . Joint pain   . KNEE INJURY, LEFT 01/01/2009   Qualifier: Diagnosis of  By: Amil Amen MD, Benjamine Mola    . Migraine   . ONYCHOMYCOSIS, TOENAILS 11/22/2008   Qualifier: Diagnosis of  By: Radene Ou MD, [place]    . Shortness of breath   . Sleep apnea   . TINEA PEDIS 11/22/2008   Qualifier: Diagnosis of  By: Radene Ou MD, [place]      Past Surgical History:  Procedure Laterality Date  . WISDOM TOOTH EXTRACTION  2008   x4    There were no vitals filed for this visit.      Subjective Assessment - 06/03/17 0804    Subjective Feeling good this morning.    Pain Score 2    Pain Location Back   Pain Orientation Left   Pain Descriptors / Indicators Aching   Aggravating Factors  standing prolonged    Pain  Relieving Factors exercises, stretches, heat                          [facility] Adult PT Treatment/Exercise - 06/03/17 0001      Lumbar Exercises: Stretches   Passive Hamstring Stretch Limitations seated hamstring stretch 2x30sec hold   bilateral    Pelvic Tilt Limitations 10 reps 2 sets hooklying    Quadruped Mid Back Stretch Limitations seated childs pose with physioball forward and lateral.      Lumbar Exercises: Seated   LAQ on Chair Limitations 10 x 2 with abdominal brace    Hip Flexion on Ball Limitations seated march green band with abdominal brace.    Other Seated Lumbar Exercises seated clam green  band with abdominal bracing 2x10; reviewed seated PPT x10;      Lumbar Exercises: Supine   AB Set Limitations physioball on abdomen , then obliques x 10 each    Bent Knee Raise 10 reps   Bent Knee Raise Limitations with pelvic tilt    Bridge 10 reps;20 reps   Bridge Limitations with initial pelvic tilt  Straight Leg Raise 10 reps  both      Lumbar Exercises: Sidelying   Hip Abduction 10 reps  2 sets                  PT Short Term Goals - 06/03/17 6010      PT SHORT TERM GOAL #1   Title Patient will demsotrate a good core contraction    Baseline good pelvic tilt    Time 4   Period Weeks   Status Achieved     PT SHORT TERM GOAL #2   Title Patient will demsotrate 5/5 bilateral lower extremity strength    Time 4   Period Weeks   Status Unable to assess     PT SHORT TERM GOAL #3   Title Patient will report centralized lower back pain with no radiation on the right    Baseline less radiation, changes   Time 4   Period Weeks   Status On-going     PT SHORT TERM GOAL #4   Title Patient will be independent with basic HEP    Time 4   Period Weeks   Status Achieved           PT Long Term Goals - 05/14/17 1012      PT LONG TERM GOAL #1   Title Patient will sit for 2 hours at work without increased pain and radiation.    Time 8    Period Weeks   Status New   Target Date 07/09/17     PT LONG TERM GOAL #2   Title Patient will walk 3000' without increased pain in order to perfrom IADL's    Time 8   Period Weeks   Status New   Target Date 07/09/17     PT LONG TERM GOAL #3   Title Patient will be independent with basic HEP for core stability    Time 8   Period Weeks   Status New   Target Date 07/09/17               Plan - 06/03/17 0810    Clinical Impression Statement Sitting slightly improved. She uses repositioning to help sitting tolerance. Intermittent stiffness. Tried Nustep today with pt stopping x 2 due to cramping in hip. Less pain today without radiation. She notes no pain down leg over last 2.5 weeks. She is independent with initial HEP. STG# 1 , #4 met.    PT Next Visit Plan Good tolerance to hooklying core/decompression exercises today, seated physioball stretches, manual as needed/ LAD    PT Home Exercise Plan side bend away from right; seated hamstring stretch, seated clamshell with abdominal brace    Consulted and Agree with Plan of Care Patient      Patient will benefit from skilled therapeutic intervention in order to improve the following deficits and impairments:  Abnormal gait, Decreased activity tolerance, Decreased strength, Pain, Obesity, Increased muscle spasms, Decreased range of motion  Visit Diagnosis: Muscle spasm of back  Chronic right-sided low back pain with right-sided sciatica     Problem List Patient Active Problem List   Diagnosis Date Noted  . Menorrhagia 05/18/2017  . Sacral radiculopathy 02/04/2017  . Insulin resistance 02/02/2017  . At risk for violence to self related to depression 02/02/2017  . Morbid obesity ([place]) 01/01/2017  . Sore throat (viral) 11/21/2016  . Vitamin D deficiency 11/12/2016  . Prediabetes 11/12/2016  . Asthma 07/25/2016  . Abdominal pain 06/04/2016  . Otitis, externa,  infective 06/04/2016  . Dysuria 03/20/2016  . Gout 11/21/2015   . Obesity, morbid, BMI 50 or higher ([place]) 05/14/2015  . BIPOLAR DISORDER UNSPECIFIED 11/22/2008  . Depression 12/10/2006  . MIGRAINE, UNSPEC., W/O INTRACTABLE MIGRAINE 12/10/2006  . GASTROESOPHAGEAL REFLUX, NO ESOPHAGITIS 12/10/2006  . OSA (obstructive sleep apnea) 12/10/2006    Dorene Ar, PTA  06/03/2017, 8:54 AM  [facility] [address] [place], [place], [address] Phone: [phone]   Fax:  [phone]  Name: NANIE DUNKLEBERGER MRN: [id number] Date of Birth: 21-Feb-1981

## 2017-06-04 ENCOUNTER — Ambulatory Visit: Payer: 59 | Admitting: Physical Therapy

## 2017-06-04 DIAGNOSIS — M62830 Muscle spasm of back: Secondary | ICD-10-CM | POA: Diagnosis not present

## 2017-06-04 DIAGNOSIS — M5441 Lumbago with sciatica, right side: Secondary | ICD-10-CM | POA: Diagnosis not present

## 2017-06-04 DIAGNOSIS — G8929 Other chronic pain: Secondary | ICD-10-CM | POA: Diagnosis not present

## 2017-06-04 NOTE — Therapy (Signed)
[facility] [address] [place], [place], [address] Phone: [phone]   Fax:  [phone]  Physical Therapy Treatment  Patient Details  Name: Catherine [name] MRN: [id number] Date of Birth: [birth year] Referring Provider: Lisbeth Renshaw MD   Encounter Date: 06/04/2017      PT End of Session - 06/04/17 0924    Visit Number 5   Number of Visits 16   Date for PT Re-Evaluation 07/09/17   Authorization Type [facility]    PT Start Time 0845   PT Stop Time 0926   PT Time Calculation (min) 41 min   Activity Tolerance Patient tolerated treatment well   Behavior During Therapy [facility] for tasks assessed/performed      Past Medical History:  Diagnosis Date  . Allergy   . Anxiety   . Arthritis   . Asthma    since baby  . ASTHMA, UNSPECIFIED 12/10/2006   Qualifier: Diagnosis of  By: Abundio Miu    . CELLULITIS AND ABSCESS OF LEG EXCEPT FOOT 12/30/2007   Qualifier: Diagnosis of  By: Daphine Deutscher FNP, Zena Amos    . DEPENDENT EDEMA, LEGS, BILATERAL 11/22/2008   Qualifier: Diagnosis of  By: Barbaraann Barthel MD, [place]    . Depression 2003Dx  . Edema   . Food allergy    citris  . FOOT PAIN, RIGHT 11/22/2008   Qualifier: Diagnosis of  By: Barbaraann Barthel MD, [place]    . Gastric ulcer   . GERD (gastroesophageal reflux disease)   . Gout   . Hypertension   . Joint pain   . KNEE INJURY, LEFT 01/01/2009   Qualifier: Diagnosis of  By: Delrae Alfred MD, Lanora Manis    . Migraine   . ONYCHOMYCOSIS, TOENAILS 11/22/2008   Qualifier: Diagnosis of  By: Barbaraann Barthel MD, [place]    . Shortness of breath   . Sleep apnea   . TINEA PEDIS 11/22/2008   Qualifier: Diagnosis of  By: Barbaraann Barthel MD, [place]      Past Surgical History:  Procedure Laterality Date  . WISDOM TOOTH EXTRACTION  2008   x4    There were no vitals filed for this visit.      Subjective Assessment - 06/04/17 0846    Subjective feeling pretty good this morning, having some occasional pains but  overall much better.  walking and standing still seem to aggravate   Diagnostic tests MRI: L5 S1 Cyst compressing on the L1 nerve root    Currently in Pain? Yes   Pain Score 1    Pain Location Back   Pain Orientation Right   Pain Descriptors / Indicators Aching   Pain Type Chronic pain   Pain Onset More than a month ago   Aggravating Factors  standing, walking   Pain Relieving Factors exercises, stretches, heat                         [facility] Adult PT Treatment/Exercise - 06/04/17 0849      Lumbar Exercises: Stretches   Passive Hamstring Stretch 2 reps;30 seconds   Passive Hamstring Stretch Limitations seated; bil   Quadruped Mid Back Stretch Limitations seated childs pose with physioball forward and lateral.      Lumbar Exercises: Seated   Sit to Stand 10 reps   Sit to Stand Limitations with abdominal activation     Lumbar Exercises: Supine   Clam 10 reps   Clam Limitations alternating; green theraband   Bent Knee Raise 10 reps  Bent Knee Raise Limitations alternating; green theraband   Bridge 10 reps;5 seconds   Bridge Limitations with green theraband   Other Supine Lumbar Exercises decompression series x 10 reps each                PT Education - 06/04/17 0924    Education provided Yes   Education Details decompression exercises   Person(s) Educated Patient   Methods Explanation;Demonstration;Handout   Comprehension Verbalized understanding;Returned demonstration;Need further instruction          PT Short Term Goals - 06/03/17 1610      PT SHORT TERM GOAL #1   Title Patient will demsotrate a good core contraction    Baseline good pelvic tilt    Time 4   Period Weeks   Status Achieved     PT SHORT TERM GOAL #2   Title Patient will demsotrate 5/5 bilateral lower extremity strength    Time 4   Period Weeks   Status Unable to assess     PT SHORT TERM GOAL #3   Title Patient will report centralized lower back pain with no radiation  on the right    Baseline less radiation, changes   Time 4   Period Weeks   Status On-going     PT SHORT TERM GOAL #4   Title Patient will be independent with basic HEP    Time 4   Period Weeks   Status Achieved           PT Long Term Goals - 05/14/17 1012      PT LONG TERM GOAL #1   Title Patient will sit for 2 hours at work without increased pain and radiation.    Time 8   Period Weeks   Status New   Target Date 07/09/17     PT LONG TERM GOAL #2   Title Patient will walk 3000' without increased pain in order to perfrom IADL's    Time 8   Period Weeks   Status New   Target Date 07/09/17     PT LONG TERM GOAL #3   Title Patient will be independent with basic HEP for core stability    Time 8   Period Weeks   Status New   Target Date 07/09/17               Plan - 06/04/17 0926    Clinical Impression Statement Pt tolerated session well today.  She is interested in weaning from cane, so educated on how to begin progressing from cane as long as pain allows.    PT Treatment/Interventions ADLs/Self Care Home Management;Cryotherapy;Electrical Stimulation;Iontophoresis 4mg /ml Dexamethasone;Gait training;Stair training;Ultrasound;Therapeutic activities;Therapeutic exercise;Patient/family education;Passive range of motion;Manual techniques;Taping;Prosthetic Training;Dry needling;Neuromuscular re-education;Moist Heat   PT Next Visit Plan work on gait without cane, continue core/hip/manual exercise PRN   Consulted and Agree with Plan of Care Patient      Patient will benefit from skilled therapeutic intervention in order to improve the following deficits and impairments:  Abnormal gait, Decreased activity tolerance, Decreased strength, Pain, Obesity, Increased muscle spasms, Decreased range of motion  Visit Diagnosis: Muscle spasm of back  Chronic right-sided low back pain with right-sided sciatica     Problem List Patient Active Problem List   Diagnosis Date  Noted  . Menorrhagia 05/18/2017  . Sacral radiculopathy 02/04/2017  . Insulin resistance 02/02/2017  . At risk for violence to self related to depression 02/02/2017  . Morbid obesity (HCC) 01/01/2017  . Sore throat (viral)  11/21/2016  . Vitamin D deficiency 11/12/2016  . Prediabetes 11/12/2016  . Asthma 07/25/2016  . Abdominal pain 06/04/2016  . Otitis, externa, infective 06/04/2016  . Dysuria 03/20/2016  . Gout 11/21/2015  . Obesity, morbid, BMI 50 or higher (HCC) 05/14/2015  . BIPOLAR DISORDER UNSPECIFIED 11/22/2008  . Depression 12/10/2006  . MIGRAINE, UNSPEC., W/O INTRACTABLE MIGRAINE 12/10/2006  . GASTROESOPHAGEAL REFLUX, NO ESOPHAGITIS 12/10/2006  . OSA (obstructive sleep apnea) 12/10/2006      Clarita Crane, PT, DPT 06/04/17 9:28 AM    [facility] [address] [place], [place], [address] Phone: [phone]   Fax:  [phone]  Name: Catherine [name] MRN: [id number] Date of Birth: [birth year]

## 2017-06-08 ENCOUNTER — Encounter: Payer: Self-pay | Admitting: Physical Therapy

## 2017-06-08 ENCOUNTER — Ambulatory Visit: Payer: 59 | Admitting: Physical Therapy

## 2017-06-08 DIAGNOSIS — M5441 Lumbago with sciatica, right side: Secondary | ICD-10-CM

## 2017-06-08 DIAGNOSIS — G8929 Other chronic pain: Secondary | ICD-10-CM | POA: Diagnosis not present

## 2017-06-08 DIAGNOSIS — M62830 Muscle spasm of back: Secondary | ICD-10-CM

## 2017-06-08 MED FILL — FUROSEMIDE 20 MG TABLET: 20 | 30 days supply | Qty: 30 | Fill #1

## 2017-06-08 NOTE — Therapy (Signed)
[facility] [address] [place], [place], [address] Phone: [phone]   Fax:  [phone]  Physical Therapy Treatment  Patient Details  Name: Catherine [name] MRN: [id number] Date of Birth: Jan 02, 1981 Referring Provider: Lisbeth Renshaw MD   Encounter Date: 06/08/2017      PT End of Session - 06/08/17 0845    Visit Number 6   Number of Visits 16   Date for PT Re-Evaluation 07/09/17   PT Start Time 0810  short session due to patient late,  heat  too   PT Stop Time 0855   PT Time Calculation (min) 45 min   Behavior During Therapy [facility] for tasks assessed/performed      Past Medical History:  Diagnosis Date  . Allergy   . Anxiety   . Arthritis   . Asthma    since baby  . ASTHMA, UNSPECIFIED 12/10/2006   Qualifier: Diagnosis of  By: Abundio Miu    . CELLULITIS AND ABSCESS OF LEG EXCEPT FOOT 12/30/2007   Qualifier: Diagnosis of  By: Daphine Deutscher FNP, Zena Amos    . DEPENDENT EDEMA, LEGS, BILATERAL 11/22/2008   Qualifier: Diagnosis of  By: Barbaraann Barthel MD, [place]    . Depression 2003Dx  . Edema   . Food allergy    citris  . FOOT PAIN, RIGHT 11/22/2008   Qualifier: Diagnosis of  By: Barbaraann Barthel MD, [place]    . Gastric ulcer   . GERD (gastroesophageal reflux disease)   . Gout   . Hypertension   . Joint pain   . KNEE INJURY, LEFT 01/01/2009   Qualifier: Diagnosis of  By: Delrae Alfred MD, Lanora Manis    . Migraine   . ONYCHOMYCOSIS, TOENAILS 11/22/2008   Qualifier: Diagnosis of  By: Barbaraann Barthel MD, [place]    . Shortness of breath   . Sleep apnea   . TINEA PEDIS 11/22/2008   Qualifier: Diagnosis of  By: Barbaraann Barthel MD, [place]      Past Surgical History:  Procedure Laterality Date  . WISDOM TOOTH EXTRACTION  2008   x4    There were no vitals filed for this visit.      Subjective Assessment - 06/08/17 0814    Subjective Pain about 50 % better.  Walking at [facility] is shorter and level.  Has not used the can in 4 days. (on  level surfaces)   Currently in Pain? Yes   Pain Score 4    Pain Location Back   Pain Orientation Right   Pain Type Chronic pain   Pain Frequency Constant   Aggravating Factors  LONGER STANDING, and walking.  Longer hours of sitting without re-positioning.    Pain Relieving Factors exercises , stretching, heat,  medication   Effect of Pain on Daily Activities Limits some walking,  Limits ADL's  needs mini breaks,  needs to sit for longer vacumes.  ETC>   Multiple Pain Sites No                         [facility] Adult PT Treatment/Exercise - 06/08/17 0001      Lumbar Exercises: Stretches   Passive Hamstring Stretch 3 reps;30 seconds   Single Knee to Chest Stretch 3 reps;30 seconds   Single Knee to Chest Stretch Limitations PROM   Pelvic Tilt --  10 reps,  mobility improved.     Lumbar Exercises: Supine   Clam 10 reps   Clam Limitations alternating; green theraband   Bent Knee Raise  5 reps   Bent Knee Raise Limitations unable to hold stabilization on right with lifting left.    Bridge 10 reps   Bridge Limitations 1 set with green band     Moist Heat Therapy   Number Minutes Moist Heat 15 Minutes   Moist Heat Location Lumbar Spine                PT Education - 06/08/17 0845    Education provided Yes   Education Details how tight hamstrings affect back   Person(s) Educated Patient   Methods Explanation   Comprehension Verbalized understanding          PT Short Term Goals - 06/08/17 1610      PT SHORT TERM GOAL #1   Title Patient will demsotrate a good core contraction    Baseline good pelvic tilt    Time 4   Period Weeks   Status Achieved     PT SHORT TERM GOAL #3   Title Patient will report centralized lower back pain with no radiation on the right    Baseline gets raidiation with sitting  from 45 to 1.5 hours depends on surface and height   Time 4   Period Weeks   Status On-going     PT SHORT TERM GOAL #4   Title Patient will be  independent with basic HEP    Time 4   Period Weeks   Status Achieved           PT Long Term Goals - 05/14/17 1012      PT LONG TERM GOAL #1   Title Patient will sit for 2 hours at work without increased pain and radiation.    Time 8   Period Weeks   Status New   Target Date 07/09/17     PT LONG TERM GOAL #2   Title Patient will walk 3000' without increased pain in order to perfrom IADL's    Time 8   Period Weeks   Status New   Target Date 07/09/17     PT LONG TERM GOAL #3   Title Patient will be independent with basic HEP for core stability    Time 8   Period Weeks   Status New   Target Date 07/09/17               Plan - 06/08/17 0846    Clinical Impression Statement Transfers on /off bed less guarded and take less time.  Patient feel her pain is 50% improved.  She is doing her HEP.    PT Treatment/Interventions ADLs/Self Care Home Management;Cryotherapy;Electrical Stimulation;Iontophoresis 4mg /ml Dexamethasone;Gait training;Stair training;Ultrasound;Therapeutic activities;Therapeutic exercise;Patient/family education;Passive range of motion;Manual techniques;Taping;Prosthetic Training;Dry needling;Neuromuscular re-education;Moist Heat   PT Next Visit Plan work on gait without cane, continue core/hip/manual exercise PRN   PT Home Exercise Plan side bend away from right; seated hamstring stretch, seated clamshell with abdominal brace    Consulted and Agree with Plan of Care Patient      Patient will benefit from skilled therapeutic intervention in order to improve the following deficits and impairments:  Abnormal gait, Decreased activity tolerance, Decreased strength, Pain, Obesity, Increased muscle spasms, Decreased range of motion  Visit Diagnosis: Muscle spasm of back  Chronic right-sided low back pain with right-sided sciatica     Problem List Patient Active Problem List   Diagnosis Date Noted  . Menorrhagia 05/18/2017  . Sacral radiculopathy  02/04/2017  . Insulin resistance 02/02/2017  . At risk for violence to  self related to depression 02/02/2017  . Morbid obesity (HCC) 01/01/2017  . Sore throat (viral) 11/21/2016  . Vitamin D deficiency 11/12/2016  . Prediabetes 11/12/2016  . Asthma 07/25/2016  . Abdominal pain 06/04/2016  . Otitis, externa, infective 06/04/2016  . Dysuria 03/20/2016  . Gout 11/21/2015  . Obesity, morbid, BMI 50 or higher (HCC) 05/14/2015  . BIPOLAR DISORDER UNSPECIFIED 11/22/2008  . Depression 12/10/2006  . MIGRAINE, UNSPEC., W/O INTRACTABLE MIGRAINE 12/10/2006  . GASTROESOPHAGEAL REFLUX, NO ESOPHAGITIS 12/10/2006  . OSA (obstructive sleep apnea) 12/10/2006    Lizzie An PTA 06/08/2017, 8:48 AM  [facility] [address] [place], [place], [address] Phone: [phone]   Fax:  [phone]  Name: Catherine [name] MRN: [id number] Date of Birth: 1981/08/11

## 2017-06-10 ENCOUNTER — Ambulatory Visit (INDEPENDENT_AMBULATORY_CARE_PROVIDER_SITE_OTHER): Payer: 59 | Admitting: Physician Assistant

## 2017-06-10 ENCOUNTER — Other Ambulatory Visit: Payer: Self-pay | Admitting: Internal Medicine

## 2017-06-10 ENCOUNTER — Ambulatory Visit: Payer: 59 | Admitting: Physical Therapy

## 2017-06-10 VITALS — BP 103/70 | HR 89 | Temp 98.2°F | Ht 67.0 in | Wt >= 6400 oz

## 2017-06-10 DIAGNOSIS — R609 Edema, unspecified: Secondary | ICD-10-CM | POA: Diagnosis not present

## 2017-06-10 DIAGNOSIS — M62830 Muscle spasm of back: Secondary | ICD-10-CM | POA: Diagnosis not present

## 2017-06-10 DIAGNOSIS — M5441 Lumbago with sciatica, right side: Secondary | ICD-10-CM | POA: Diagnosis not present

## 2017-06-10 DIAGNOSIS — Z6841 Body Mass Index (BMI) 40.0 and over, adult: Secondary | ICD-10-CM | POA: Diagnosis not present

## 2017-06-10 DIAGNOSIS — E669 Obesity, unspecified: Secondary | ICD-10-CM | POA: Diagnosis not present

## 2017-06-10 DIAGNOSIS — G8929 Other chronic pain: Secondary | ICD-10-CM | POA: Diagnosis not present

## 2017-06-10 DIAGNOSIS — IMO0001 Reserved for inherently not codable concepts without codable children: Secondary | ICD-10-CM

## 2017-06-10 NOTE — Progress Notes (Signed)
Office: [phone]  /  Fax: [phone]   HPI:   Chief Complaint: OBESITY Catherine [name] is here to discuss her progress with her obesity treatment plan. She is on the Category 3 plan and is following her eating plan approximately 85 % of the time. She states she is exercising 0 minutes 0 times per week. Catherine [name] is retaining fluid and has a history of fluid retention. She is on Lasix 20 mg daily but has not been taking it because of increased urination. Catherine [name] has been following the plan and hunger is well controlled. Her weight is (!) 431 lb (195.5 kg) today and has had a weight gain of 8 pounds over a period of 2 weeks since her last visit. She has lost 37 lbs since starting treatment with [name].  Dependent edema Catherine [name] has a diagnosis of dependent edema in bilateral lower legs and is prescribed Lasix 40 mg qd by her primary care provider. Today, she  is up 8 pounds since 2 weeks ago and states she has not been taken prescribed Lasix for the past 2 weeks because of the side effect of increase urination.  She admits to feeling "heavier" but denies dyspnea, chest pain or swelling of legs. On record review, last echo on 8/8 with no evidence of CHF.  ALLERGIES: Allergies  Allergen Reactions  . Citrus Swelling    MEDICATIONS: Current Outpatient Prescriptions on File Prior to Visit  Medication Sig Dispense Refill  . aspirin-acetaminophen-caffeine (EXCEDRIN MIGRAINE) 250-250-65 MG per tablet Take 1 tablet by mouth 2 (two) times daily as needed for migraine. For headache    . budesonide (PULMICORT) 180 MCG/ACT inhaler Inhale 1 puff into the lungs 2 (two) times daily. 1 Inhaler 1  . buPROPion (WELLBUTRIN SR) 200 MG 12 hr tablet Take 1 tablet (200 mg total) by mouth every morning. 30 tablet 0  . diclofenac (CATAFLAM) 50 MG tablet Take 50 mg by mouth 2 (two) times daily at 10 AM and 5 PM.    . diclofenac sodium (VOLTAREN) 1 % GEL Apply 2 g topically 4 (four) times daily. 100 g 1  . furosemide  (LASIX) 20 MG tablet TAKE 1 TABLET (20 MG TOTAL) BY MOUTH DAILY. 30 tablet 1  . gabapentin (NEURONTIN) 300 MG capsule Take 2 capsules (600 mg total) by mouth 3 (three) times daily. (Patient taking differently: Take 900 mg by mouth 3 (three) times daily. ) 180 capsule 3  . ibuprofen (ADVIL,MOTRIN) 200 MG tablet Take 200 mg by mouth every 6 (six) hours as needed for moderate pain.    . norgestimate-ethinyl estradiol (ORTHO-CYCLEN,SPRINTEC,PREVIFEM) 0.25-35 MG-MCG tablet Take 1 tablet by mouth daily. 1 Package 11  . ranitidine (ZANTAC) 150 MG tablet TAKE 1 TABLET BY MOUTH 2 TIMES DAILY. 60 tablet 0  . Vitamin D, Ergocalciferol, (DRISDOL) 50000 units CAPS capsule Take 1 capsule (50,000 Units total) by mouth every 7 (seven) days. 4 capsule 0   No current facility-administered medications on file prior to visit.     PAST MEDICAL HISTORY: Past Medical History:  Diagnosis Date  . Allergy   . Anxiety   . Arthritis   . Asthma    since baby  . ASTHMA, UNSPECIFIED 12/10/2006   Qualifier: Diagnosis of  By: Abundio Miu    . CELLULITIS AND ABSCESS OF LEG EXCEPT FOOT 12/30/2007   Qualifier: Diagnosis of  By: Daphine Deutscher FNP, Zena Amos    . DEPENDENT EDEMA, LEGS, BILATERAL 11/22/2008   Qualifier: Diagnosis of  By: Barbaraann Barthel MD, [place]    .  Depression 2003Dx  . Edema   . Food allergy    citris  . FOOT PAIN, RIGHT 11/22/2008   Qualifier: Diagnosis of  By: Barbaraann Barthel MD, [place]    . Gastric ulcer   . GERD (gastroesophageal reflux disease)   . Gout   . Hypertension   . Joint pain   . KNEE INJURY, LEFT 01/01/2009   Qualifier: Diagnosis of  By: Delrae Alfred MD, Lanora Manis    . Migraine   . ONYCHOMYCOSIS, TOENAILS 11/22/2008   Qualifier: Diagnosis of  By: Barbaraann Barthel MD, [place]    . Shortness of breath   . Sleep apnea   . TINEA PEDIS 11/22/2008   Qualifier: Diagnosis of  By: Barbaraann Barthel MD, Benetta Spar      PAST SURGICAL HISTORY: Past Surgical History:  Procedure Laterality Date  . WISDOM TOOTH EXTRACTION  2008     x4    SOCIAL HISTORY: Social History  Substance Use Topics  . Smoking status: Former Smoker    Packs/day: 0.50    Years: 18.00    Types: Cigarettes    Start date: 10/13/1996    Quit date: 01/12/2015  . Smokeless tobacco: Never Used  . Alcohol use 0.6 oz/week    1 Standard drinks or equivalent per week     Comment: rarely drinks    FAMILY HISTORY: Family History  Problem Relation Age of Onset  . Hypertension Mother   . Hyperlipidemia Mother   . Cancer Mother        bone cancer  . Heart disease Mother   . Sudden death Mother   . Stroke Mother   . Thyroid disease Mother   . Sleep apnea Mother   . Obesity Mother   . Sudden death Father   . Hypertension Father   . Heart disease Father   . Hyperlipidemia Father   . Prostate cancer Father   . Lung cancer Father   . Obesity Father   . Alcoholism Father   . Sudden death Brother   . Hypertension Brother   . Hyperlipidemia Brother   . Heart attack Brother   . Heart disease Brother   . Stroke Brother   . Alcoholism Brother   . Drug abuse Brother   . Heart disease Sister   . Hypertension Sister   . Thyroid cancer Sister   . Diabetes Neg Hx   . Colon cancer Neg Hx   . Stomach cancer Neg Hx   . Esophageal cancer Neg Hx   . Rectal cancer Neg Hx   . Liver cancer Neg Hx     ROS: Review of Systems  Constitutional: Negative for weight loss.  Respiratory: Negative for shortness of breath.   Cardiovascular: Negative for chest pain and leg swelling.    PHYSICAL EXAM: Blood pressure 103/70, pulse 89, temperature 98.2 F (36.8 C), height 5\' 7"  (1.702 m), weight (!) 431 lb (195.5 kg), SpO2 98 %. Body mass index is 67.5 kg/m. Physical Exam  Constitutional: She is oriented to person, place, and time. She appears well-developed and well-nourished.  Cardiovascular: Normal rate.   Pulmonary/Chest: Effort normal.  Musculoskeletal: Normal range of motion.  Neurological: She is oriented to person, place, and time.  Skin: Skin  is warm and dry.  Psychiatric: She has a normal mood and affect. Her behavior is normal.  Vitals reviewed.   RECENT LABS AND TESTS: BMET    Component Value Date/Time   NA 140 01/20/2017 0831   K 4.4 01/20/2017 0831   CL 102 01/20/2017 0831  CO2 23 01/20/2017 0831   GLUCOSE 80 01/20/2017 0831   GLUCOSE 83 06/04/2016 1155   BUN 16 01/20/2017 0831   CREATININE 0.74 01/20/2017 0831   CREATININE 0.90 06/04/2016 1155   CALCIUM 9.3 01/20/2017 0831   GFRNONAA 105 01/20/2017 0831   GFRNONAA 84 06/04/2016 1155   GFRAA 121 01/20/2017 0831   GFRAA >89 06/04/2016 1155   Lab Results  Component Value Date   HGBA1C 5.2 05/19/2017   HGBA1C 5.4 01/20/2017   HGBA1C 5.6 10/27/2016   HGBA1C 5.0 11/21/2015   Lab Results  Component Value Date   INSULIN 29.7 (H) 01/20/2017   INSULIN 30.1 (H) 10/27/2016   CBC    Component Value Date/Time   WBC 9.7 05/18/2017 1611   WBC 13.4 (H) 06/04/2016 1155   RBC 4.17 05/18/2017 1611   RBC 4.37 06/04/2016 1155   HGB 11.2 05/18/2017 1611   HCT 35.6 05/18/2017 1611   PLT 343 05/18/2017 1611   MCV 85 05/18/2017 1611   MCH 26.9 05/18/2017 1611   MCH 27.7 06/04/2016 1155   MCHC 31.5 05/18/2017 1611   MCHC 32.4 06/04/2016 1155   RDW 14.0 05/18/2017 1611   LYMPHSABS 2.9 01/20/2017 0831   MONOABS 1.3 (H) 09/21/2013 1635   EOSABS 0.1 01/20/2017 0831   BASOSABS 0.0 01/20/2017 0831   Iron/TIBC/Ferritin/ %Sat    Component Value Date/Time   IRON 25 (L) 05/18/2017 1611   TIBC 363 05/18/2017 1611   FERRITIN 16 05/18/2017 1611   IRONPCTSAT 7 (LL) 05/18/2017 1611   Lipid Panel     Component Value Date/Time   CHOL 152 10/27/2016 1058   TRIG 57 10/27/2016 1058   HDL 43 10/27/2016 1058   CHOLHDL 3.6 11/21/2015 1047   VLDL 10 11/21/2015 1047   LDLCALC 98 10/27/2016 1058   Hepatic Function Panel     Component Value Date/Time   PROT 6.6 01/20/2017 0831   ALBUMIN 3.9 01/20/2017 0831   AST 21 01/20/2017 0831   ALT 19 01/20/2017 0831   ALKPHOS 65  01/20/2017 0831   BILITOT 0.3 01/20/2017 0831      Component Value Date/Time   TSH 1.43 05/19/2017 1245   TSH 1.460 10/27/2016 1058   TSH 0.96 11/21/2015 1047    ASSESSMENT AND PLAN: Dependent edema  Class 3 obesity with serious comorbidity and body mass index (BMI) of 60.0 to 69.9 in adult, unspecified obesity type (HCC)  PLAN:  Dependent Edema Catherine [name] is educated about the importance of taking her prescribed Diuretic, Lasix, as it helps get rid of excess fluid in her body. She acknowledges the risk of fluid overload which can include shortness of breath and death. Catherine [name] agrees to continue Lasix at 20 mg daily and decrease her sodium intake. She is advised to follow up with her primary care provider for symptomatic improvement and consideration for repeat echocardiogram.  She agrees to follow up with our clinic in 2 weeks.  We spent > than 50% of the 15 minute visit on the counseling as documented in the note.  Obesity Catherine [name] is currently in the action stage of change. As such, her goal is to continue with weight loss efforts She has agreed to follow the Category 3 plan Catherine [name] has been instructed to work up to a goal of 150 minutes of combined cardio and strengthening exercise per week for weight loss and overall health benefits. We discussed the following Behavioral Modification Strategies today: better snacking choices, increasing lean protein intake and decreasing sodium intake  Catherine [name]  has agreed to follow up with our clinic in 2 weeks. She was informed of the importance of frequent follow up visits to maximize her success with intensive lifestyle modifications for her multiple health conditions.  I, [place] Crane, am acting as transcriptionist for Illa Level, PA-C  I have reviewed the above documentation for accuracy and completeness, and I agree with the above. -Illa Level, PA-C  I have reviewed the above note and agree with the plan. -Quillian Quince, MD   OBESITY  BEHAVIORAL INTERVENTION VISIT  Today's visit was # 15 out of 22.  Starting weight: 468 lbs Starting date: 10/27/16 Today's weight : 431 lbs  Today's date: 06/10/2017 Total lbs lost to date: 82 (Patients must lose 7 lbs in the first 6 months to continue with counseling)   ASK: We discussed the diagnosis of obesity with Catherine [name] today and Catherine [name] agreed to give [name] permission to discuss obesity behavioral modification therapy today.  ASSESS: Catherine [name] has the diagnosis of obesity and her BMI today is 67.49 Catherine [name] is in the action stage of change   ADVISE: Catherine [name] was educated on the multiple health risks of obesity as well as the benefit of weight loss to improve her health. She was advised of the need for long term treatment and the importance of lifestyle modifications.  AGREE: Multiple dietary modification options and treatment options were discussed and  Catherine [name] agreed to follow the Category 3 plan We discussed the following Behavioral Modification Strategies today: better snacking choices, increasing lean protein intake and decreasing sodium intake

## 2017-06-10 NOTE — Therapy (Signed)
[facility] [address] [place], [place], [address] Phone: [phone]   Fax:  [phone]  Physical Therapy Treatment  Patient Details  Name: Catherine [name] MRN: [id number] Date of Birth: 1980/11/22 Referring Provider: Lisbeth Renshaw MD   Encounter Date: 06/10/2017      PT End of Session - 06/10/17 0809    Visit Number 7   Number of Visits 16   Date for PT Re-Evaluation 07/09/17   Authorization Type [facility]MR    PT Start Time 0804   PT Stop Time 0900   PT Time Calculation (min) 56 min      Past Medical History:  Diagnosis Date  . Allergy   . Anxiety   . Arthritis   . Asthma    since baby  . ASTHMA, UNSPECIFIED 12/10/2006   Qualifier: Diagnosis of  By: Abundio Miu    . CELLULITIS AND ABSCESS OF LEG EXCEPT FOOT 12/30/2007   Qualifier: Diagnosis of  By: Daphine Deutscher FNP, Zena Amos    . DEPENDENT EDEMA, LEGS, BILATERAL 11/22/2008   Qualifier: Diagnosis of  By: Barbaraann Barthel MD, [place]    . Depression 2003Dx  . Edema   . Food allergy    citris  . FOOT PAIN, RIGHT 11/22/2008   Qualifier: Diagnosis of  By: Barbaraann Barthel MD, [place]    . Gastric ulcer   . GERD (gastroesophageal reflux disease)   . Gout   . Hypertension   . Joint pain   . KNEE INJURY, LEFT 01/01/2009   Qualifier: Diagnosis of  By: Delrae Alfred MD, Lanora Manis    . Migraine   . ONYCHOMYCOSIS, TOENAILS 11/22/2008   Qualifier: Diagnosis of  By: Barbaraann Barthel MD, [place]    . Shortness of breath   . Sleep apnea   . TINEA PEDIS 11/22/2008   Qualifier: Diagnosis of  By: Barbaraann Barthel MD, [place]      Past Surgical History:  Procedure Laterality Date  . WISDOM TOOTH EXTRACTION  2008   x4    There were no vitals filed for this visit.      Subjective Assessment - 06/10/17 0807    Subjective No cane since last Thursday. I notice SOB more with walking.    Currently in Pain? No/denies            [facility] PT Assessment - 06/10/17 0001      Strength   Right/Left Hip Left    Right Hip Flexion 4+/5   Left Hip Flexion 5/5                     [facility] Adult PT Treatment/Exercise - 06/10/17 0001      Ambulation/Gait   Ambulation/Gait Yes   Ambulation/Gait Assistance 7: Independent   Ambulation Distance (Feet) 615 Feet   Assistive device None   Gait Pattern Step-through pattern   Ambulation Surface Level;Indoor   Gait velocity 649ft/234sec=2.62 ft/sec     Lumbar Exercises: Stretches   Passive Hamstring Stretch 3 reps;30 seconds   Passive Hamstring Stretch Limitations passive    Pelvic Tilt Limitations 10 reps 2 sets hooklying   added ball second set      Lumbar Exercises: Aerobic   Stationary Bike Nustep L 4 UE/LE x 6 minutes     Lumbar Exercises: Supine   Bridge 10 reps   Bridge Limitations with ball squeeze    Straight Leg Raise 20 reps   Straight Leg Raises Limitations with abdominal draw in      Lumbar Exercises: Sidelying  Hip Abduction 10 reps;20 reps  2 sets   Hip Abduction Limitations both     Moist Heat Therapy   Number Minutes Moist Heat 15 Minutes   Moist Heat Location Lumbar Spine                  PT Short Term Goals - 06/08/17 [id number]      PT SHORT TERM GOAL #1   Title Patient will demsotrate a good core contraction    Baseline good pelvic tilt    Time 4   Period Weeks   Status Achieved     PT SHORT TERM GOAL #3   Title Patient will report centralized lower back pain with no radiation on the right    Baseline gets raidiation with sitting  from 45 to 1.5 hours depends on surface and height   Time 4   Period Weeks   Status On-going     PT SHORT TERM GOAL #4   Title Patient will be independent with basic HEP    Time 4   Period Weeks   Status Achieved           PT Long Term Goals - 05/14/17 1012      PT LONG TERM GOAL #1   Title Patient will sit for 2 hours at work without increased pain and radiation.    Time 8   Period Weeks   Status New   Target Date 07/09/17     PT LONG TERM GOAL #2    Title Patient will walk 3000' without increased pain in order to perfrom IADL's    Time 8   Period Weeks   Status New   Target Date 07/09/17     PT LONG TERM GOAL #3   Title Patient will be independent with basic HEP for core stability    Time 8   Period Weeks   Status New   Target Date 07/09/17               Plan - 06/10/17 [id number]    Clinical Impression Statement Worked on gait distance. Pt report noting an increase in SOB she believes might be due to stopping use of SPC. SHe was able to ambulate 615 ft prior to LBP at 3/10. She continues to have difficulty with sitting more than 1 hour.    PT Next Visit Plan work on gait without cane-check SPO2;  continue core/hip/manual exercise PRN   PT Home Exercise Plan side bend away from right; seated hamstring stretch, seated clamshell with abdominal brace    Consulted and Agree with Plan of Care Patient      Patient will benefit from skilled therapeutic intervention in order to improve the following deficits and impairments:  Abnormal gait, Decreased activity tolerance, Decreased strength, Pain, Obesity, Increased muscle spasms, Decreased range of motion  Visit Diagnosis: Muscle spasm of back  Chronic right-sided low back pain with right-sided sciatica     Problem List Patient Active Problem List   Diagnosis Date Noted  . Menorrhagia 05/18/2017  . Sacral radiculopathy 02/04/2017  . Insulin resistance 02/02/2017  . At risk for violence to self related to depression 02/02/2017  . Morbid obesity (HCC) 01/01/2017  . Sore throat (viral) 11/21/2016  . Vitamin D deficiency 11/12/2016  . Prediabetes 11/12/2016  . Asthma 07/25/2016  . Abdominal pain 06/04/2016  . Otitis, externa, infective 06/04/2016  . Dysuria 03/20/2016  . Gout 11/21/2015  . Obesity, morbid, BMI 50 or higher (HCC) 05/14/2015  .  BIPOLAR DISORDER UNSPECIFIED 11/22/2008  . Depression 12/10/2006  . MIGRAINE, UNSPEC., W/O INTRACTABLE MIGRAINE 12/10/2006  .  GASTROESOPHAGEAL REFLUX, NO ESOPHAGITIS 12/10/2006  . OSA (obstructive sleep apnea) 12/10/2006    Sherrie Mustache, PTA 06/10/2017, 9:07 AM  [facility] [address] [place], [place], [address] Phone: [phone]   Fax:  [phone]  Name: Catherine [name] MRN: [id number] Date of Birth: November 07, 1980

## 2017-06-17 ENCOUNTER — Ambulatory Visit: Payer: 59 | Attending: Nurse Practitioner | Admitting: Physical Therapy

## 2017-06-17 DIAGNOSIS — G8929 Other chronic pain: Secondary | ICD-10-CM | POA: Diagnosis present

## 2017-06-17 DIAGNOSIS — M5441 Lumbago with sciatica, right side: Secondary | ICD-10-CM | POA: Diagnosis not present

## 2017-06-17 DIAGNOSIS — M62830 Muscle spasm of back: Secondary | ICD-10-CM | POA: Diagnosis present

## 2017-06-17 NOTE — Therapy (Signed)
[place], [place], [address] Phone: [phone]   Fax:  [phone]  Physical Therapy Treatment  Patient Details  Name: Catherine [name] MRN: [id number] Date of Birth: 03/03/1981 Referring Provider: Consuella Lose MD   Encounter Date: 06/17/2017      PT End of Session - 06/17/17 0805    Visit Number 8   Number of Visits 16   Date for PT Re-Evaluation 07/09/17   Authorization Type [facility]    PT Start Time 0800   PT Stop Time 0900   PT Time Calculation (min) 60 min      Past Medical History:  Diagnosis Date  . Allergy   . Anxiety   . Arthritis   . Asthma    since baby  . ASTHMA, UNSPECIFIED 12/10/2006   Qualifier: Diagnosis of  By: Eusebio Friendly    . CELLULITIS AND ABSCESS OF LEG EXCEPT FOOT 12/30/2007   Qualifier: Diagnosis of  By: Hassell Done FNP, Tori Milks    . DEPENDENT EDEMA, LEGS, BILATERAL 11/22/2008   Qualifier: Diagnosis of  By: Radene Ou MD, [place]    . Depression 2003Dx  . Edema   . Food allergy    citris  . FOOT PAIN, RIGHT 11/22/2008   Qualifier: Diagnosis of  By: Radene Ou MD, [place]    . Gastric ulcer   . GERD (gastroesophageal reflux disease)   . Gout   . Hypertension   . Joint pain   . KNEE INJURY, LEFT 01/01/2009   Qualifier: Diagnosis of  By: Amil Amen MD, Benjamine Mola    . Migraine   . ONYCHOMYCOSIS, TOENAILS 11/22/2008   Qualifier: Diagnosis of  By: Radene Ou MD, [place]    . Shortness of breath   . Sleep apnea   . TINEA PEDIS 11/22/2008   Qualifier: Diagnosis of  By: Radene Ou MD, [place]      Past Surgical History:  Procedure Laterality Date  . WISDOM TOOTH EXTRACTION  2008   x4    There were no vitals filed for this visit.      Subjective Assessment - 06/17/17 0804    Subjective A little achey this morning. Between 2-3/10 low back.    Currently in Pain? Yes   Pain Score 3    Pain Location Back   Pain Orientation Right   Pain Descriptors / Indicators Aching    Aggravating Factors  Prolonged positions    Pain Relieving Factors exercises, stretching, heat, meds             [facility] PT Assessment - 06/17/17 0001      Observation/Other Assessments   Focus on Therapeutic Outcomes (FOTO)  44% limited improved from 53%                      [facility] Adult PT Treatment/Exercise - 06/17/17 0001      Ambulation/Gait   Ambulation/Gait Yes   Ambulation/Gait Assistance 7: Independent   Ambulation Distance (Feet) 763 Feet   Assistive device None   Ambulation Surface Level;Indoor   Gait velocity 735f/303 sec  2.52 ft/sec     Lumbar Exercises: Stretches   Passive Hamstring Stretch 3 reps;30 seconds   Passive Hamstring Stretch Limitations seated    Lower Trunk Rotation Limitations upper trunk rotations x 10 each side, book openings    Pelvic Tilt Limitations 10 reps 2 sets hooklying   seated and supine, ball in hookying    Quadruped Mid Back Stretch Limitations seated childs pose with physioball  forward and lateral.      Lumbar Exercises: Aerobic   Stationary Bike Nustep L 5 UE/LE x 6 minutes     Lumbar Exercises: Supine   Clam 10 reps   Clam Limitations alternating; green theraband   Bridge 10 reps   Bridge Limitations and with feet over physioball    Straight Leg Raise 20 reps   Straight Leg Raises Limitations with abdominal draw in      Lumbar Exercises: Sidelying   Hip Abduction 20 reps   Hip Abduction Limitations both     Moist Heat Therapy   Number Minutes Moist Heat 15 Minutes   Moist Heat Location Lumbar Spine                  PT Short Term Goals - 06/17/17 0840      PT SHORT TERM GOAL #1   Title Patient will demsotrate a good core contraction    Status Achieved     PT SHORT TERM GOAL #2   Title Patient will demsotrate 5/5 bilateral lower extremity strength    Baseline Some 4+/5   Period Weeks   Status Partially Met     PT SHORT TERM GOAL #3   Title Patient will report centralized lower back  pain with no radiation on the right    Baseline better   Time 4   Period Weeks   Status On-going     PT SHORT TERM GOAL #4   Title Patient will be independent with basic HEP    Status Achieved           PT Long Term Goals - 06/17/17 0841      PT LONG TERM GOAL #1   Title Patient will sit for 2 hours at work without increased pain and radiation.    Baseline has to reposition at 1.5 hours    Time 8   Period Weeks   Status On-going     PT LONG TERM GOAL #2   Title Patient will walk 3000' without increased pain in order to perfrom IADL's    Baseline 763 ft in clinic    Time 8   Period Weeks   Status On-going     PT LONG TERM GOAL #3   Title Patient will be independent with basic HEP for core stability    Time 8   Period Weeks   Status On-going               Plan - 06/17/17 0350    Clinical Impression Statement Improved gait distance. She does have pain after 1.5 hours of sitting and needs to reposition. She demonstrates increased endurance with therex and has a good core contraction. FOTO score improved.  STG# 1 met.    PT Next Visit Plan work on gait without cane-check SPO2/ HR;  continue core/hip/manual exercise PRN   PT Home Exercise Plan side bend away from right; seated hamstring stretch, seated clamshell with abdominal brace    Consulted and Agree with Plan of Care Patient      Patient will benefit from skilled therapeutic intervention in order to improve the following deficits and impairments:  Abnormal gait, Decreased activity tolerance, Decreased strength, Pain, Obesity, Increased muscle spasms, Decreased range of motion  Visit Diagnosis: Chronic right-sided low back pain with right-sided sciatica  Muscle spasm of back     Problem List Patient Active Problem List   Diagnosis Date Noted  . Class 3 obesity with serious comorbidity and body mass  index (BMI) of 60.0 to 69.9 in adult [facility]) 06/10/2017  . Fluid retention 06/10/2017  . Menorrhagia  05/18/2017  . Sacral radiculopathy 02/04/2017  . Insulin resistance 02/02/2017  . At risk for violence to self related to depression 02/02/2017  . Morbid obesity ([place]) 01/01/2017  . Sore throat (viral) 11/21/2016  . Vitamin D deficiency 11/12/2016  . Prediabetes 11/12/2016  . Asthma 07/25/2016  . Abdominal pain 06/04/2016  . Otitis, externa, infective 06/04/2016  . Dysuria 03/20/2016  . Gout 11/21/2015  . Obesity, morbid, BMI 50 or higher ([place]) 05/14/2015  . BIPOLAR DISORDER UNSPECIFIED 11/22/2008  . Depression 12/10/2006  . MIGRAINE, UNSPEC., W/O INTRACTABLE MIGRAINE 12/10/2006  . GASTROESOPHAGEAL REFLUX, NO ESOPHAGITIS 12/10/2006  . OSA (obstructive sleep apnea) 12/10/2006    Dorene Ar, PTA 06/17/2017, 9:16 AM  [facility] [address] [place], [place], [address] Phone: [phone]   Fax:  [phone]  Name: LANI HAVLIK MRN: [id number] Date of Birth: 1981-03-19

## 2017-06-18 ENCOUNTER — Ambulatory Visit: Payer: 59 | Admitting: Physical Therapy

## 2017-06-18 VITALS — HR 114

## 2017-06-18 DIAGNOSIS — G8929 Other chronic pain: Secondary | ICD-10-CM

## 2017-06-18 DIAGNOSIS — M5441 Lumbago with sciatica, right side: Principal | ICD-10-CM

## 2017-06-18 DIAGNOSIS — M62830 Muscle spasm of back: Secondary | ICD-10-CM

## 2017-06-18 MED FILL — NORG-ETHIN ESTRA 0.25-0.035: 0.25-35 | 84 days supply | Qty: 84 | Fill #2

## 2017-06-18 NOTE — Therapy (Signed)
[place], [place], [address] Phone: [phone]   Fax:  [phone]  Physical Therapy Treatment  Patient Details  Name: Catherine [name] MRN: [id number] Date of Birth: 10-19-1980 Referring Provider: Consuella Lose MD   Encounter Date: 06/18/2017      PT End of Session - 06/18/17 0804    Visit Number 9   Number of Visits 16   Date for PT Re-Evaluation 07/09/17   Authorization Type [facility]    PT Start Time 0800   PT Stop Time 0900   PT Time Calculation (min) 60 min      Past Medical History:  Diagnosis Date  . Allergy   . Anxiety   . Arthritis   . Asthma    since baby  . ASTHMA, UNSPECIFIED 12/10/2006   Qualifier: Diagnosis of  By: Eusebio Friendly    . CELLULITIS AND ABSCESS OF LEG EXCEPT FOOT 12/30/2007   Qualifier: Diagnosis of  By: Hassell Done FNP, Tori Milks    . DEPENDENT EDEMA, LEGS, BILATERAL 11/22/2008   Qualifier: Diagnosis of  By: Radene Ou MD, [place]    . Depression 2003Dx  . Edema   . Food allergy    citris  . FOOT PAIN, RIGHT 11/22/2008   Qualifier: Diagnosis of  By: Radene Ou MD, [place]    . Gastric ulcer   . GERD (gastroesophageal reflux disease)   . Gout   . Hypertension   . Joint pain   . KNEE INJURY, LEFT 01/01/2009   Qualifier: Diagnosis of  By: Amil Amen MD, Benjamine Mola    . Migraine   . ONYCHOMYCOSIS, TOENAILS 11/22/2008   Qualifier: Diagnosis of  By: Radene Ou MD, [place]    . Shortness of breath   . Sleep apnea   . TINEA PEDIS 11/22/2008   Qualifier: Diagnosis of  By: Radene Ou MD, [place]      Past Surgical History:  Procedure Laterality Date  . WISDOM TOOTH EXTRACTION  2008   x4    Vitals:   06/18/17 0803  Pulse: (!) 114  SpO2: 99%        Subjective Assessment - 06/18/17 0803    Subjective I was real stiff this morning.    Currently in Pain? Yes   Pain Score 3    Pain Location Back   Pain Orientation Left   Pain Descriptors / Indicators Aching                          [facility] Adult PT Treatment/Exercise - 06/18/17 0001      Ambulation/Gait   Ambulation/Gait Yes   Ambulation/Gait Assistance 7: Independent   Ambulation Distance (Feet) 1095 Feet   Assistive device None   Gait velocity 1045f/427sec  2.56 ft/sec    Gait Comments 139 HR, 98% SPO2 at end of 7 minutes      Lumbar Exercises: Stretches   Passive Hamstring Stretch 3 reps;30 seconds   Passive Hamstring Stretch Limitations seated    Lower Trunk Rotation Limitations upper trunk rotations x 10 each side, book openings    Pelvic Tilt Limitations 10 reps 2 sets hooklying   seated and supine, ball in hookying    Quadruped Mid Back Stretch Limitations seated childs pose with physioball forward and lateral.      Lumbar Exercises: Aerobic   Stationary Bike Nustep L 5 UE/LE x 6 minutes  declined trying for 7 minutes      Lumbar Exercises: Supine   AB Set  Limitations physioball on abdomen , then obliques x 10 each    Clam --   Clam Limitations --   Bridge 10 reps   Bridge Limitations and with feet over physioball    Straight Leg Raise 20 reps   Straight Leg Raises Limitations with abdominal draw in      Lumbar Exercises: Sidelying   Clam 20 reps   Hip Abduction 20 reps   Hip Abduction Limitations both     Moist Heat Therapy   Number Minutes Moist Heat 15 Minutes   Moist Heat Location Lumbar Spine                  PT Short Term Goals - 06/17/17 0840      PT SHORT TERM GOAL #1   Title Patient will demsotrate a good core contraction    Status Achieved     PT SHORT TERM GOAL #2   Title Patient will demsotrate 5/5 bilateral lower extremity strength    Baseline Some 4+/5   Period Weeks   Status Partially Met     PT SHORT TERM GOAL #3   Title Patient will report centralized lower back pain with no radiation on the right    Baseline better   Time 4   Period Weeks   Status On-going     PT SHORT TERM GOAL #4   Title Patient will be  independent with basic HEP    Status Achieved           PT Long Term Goals - 06/17/17 0841      PT LONG TERM GOAL #1   Title Patient will sit for 2 hours at work without increased pain and radiation.    Baseline has to reposition at 1.5 hours    Time 8   Period Weeks   Status On-going     PT LONG TERM GOAL #2   Title Patient will walk 3000' without increased pain in order to perfrom IADL's    Baseline 763 ft in clinic    Time 8   Period Weeks   Status On-going     PT LONG TERM GOAL #3   Title Patient will be independent with basic HEP for core stability    Time 8   Period Weeks   Status On-going               Plan - 06/18/17 6962    Clinical Impression Statement Gait distance improved to 1095 ft today with pt beginning to feel increased lumbar pain toward the end. SHe feels more limited by fatigue than pain. Repeated physioball and upper trunk rotation stretches that patient thought were helpful last treatment.    PT Next Visit Plan work on gait without cane-check SPO2/ HR;  continue core/hip/manual exercise PRN   PT Home Exercise Plan side bend away from right; seated hamstring stretch, seated clamshell with abdominal brace    Consulted and Agree with Plan of Care Patient      Patient will benefit from skilled therapeutic intervention in order to improve the following deficits and impairments:  Abnormal gait, Decreased activity tolerance, Decreased strength, Pain, Obesity, Increased muscle spasms, Decreased range of motion  Visit Diagnosis: Chronic right-sided low back pain with right-sided sciatica  Muscle spasm of back     Problem List Patient Active Problem List   Diagnosis Date Noted  . Class 3 obesity with serious comorbidity and body mass index (BMI) of 60.0 to 69.9 in adult ([place]) 06/10/2017  .  Fluid retention 06/10/2017  . Menorrhagia 05/18/2017  . Sacral radiculopathy 02/04/2017  . Insulin resistance 02/02/2017  . At risk for violence to self  related to depression 02/02/2017  . Morbid obesity ([place]) 01/01/2017  . Sore throat (viral) 11/21/2016  . Vitamin D deficiency 11/12/2016  . Prediabetes 11/12/2016  . Asthma 07/25/2016  . Abdominal pain 06/04/2016  . Otitis, externa, infective 06/04/2016  . Dysuria 03/20/2016  . Gout 11/21/2015  . Obesity, morbid, BMI 50 or higher ([place]) 05/14/2015  . BIPOLAR DISORDER UNSPECIFIED 11/22/2008  . Depression 12/10/2006  . MIGRAINE, UNSPEC., W/O INTRACTABLE MIGRAINE 12/10/2006  . GASTROESOPHAGEAL REFLUX, NO ESOPHAGITIS 12/10/2006  . OSA (obstructive sleep apnea) 12/10/2006    Dorene Ar , PTA 06/18/2017, 9:12 AM  [place], [place], [address] Phone: [phone]   Fax:  [phone]  Name: Catherine [name] MRN: [id number] Date of Birth: 12-20-1980

## 2017-06-22 ENCOUNTER — Ambulatory Visit: Payer: 59 | Admitting: Physical Therapy

## 2017-06-24 ENCOUNTER — Encounter: Payer: Self-pay | Admitting: Physical Therapy

## 2017-06-24 ENCOUNTER — Ambulatory Visit: Payer: 59 | Admitting: Physical Therapy

## 2017-06-24 DIAGNOSIS — M62830 Muscle spasm of back: Secondary | ICD-10-CM | POA: Diagnosis not present

## 2017-06-24 DIAGNOSIS — G8929 Other chronic pain: Secondary | ICD-10-CM

## 2017-06-24 DIAGNOSIS — M5441 Lumbago with sciatica, right side: Secondary | ICD-10-CM | POA: Diagnosis not present

## 2017-06-24 NOTE — Therapy (Signed)
[place], [place], [address] Phone: [phone]   Fax:  [phone]  Physical Therapy Treatment  Patient Details  Name: Catherine [name] MRN: [id number] Date of Birth: 05-21-1981 Referring Provider: Consuella Lose MD   Encounter Date: 06/24/2017      PT End of Session - 06/24/17 0852    Visit Number 10   Number of Visits 16   Date for PT Re-Evaluation 07/09/17   PT Start Time 0803   PT Stop Time 5726   PT Time Calculation (min) 52 min   Activity Tolerance Patient tolerated treatment well;No increased pain   Behavior During Therapy WFL for tasks assessed/performed      Past Medical History:  Diagnosis Date  . Allergy   . Anxiety   . Arthritis   . Asthma    since baby  . ASTHMA, UNSPECIFIED 12/10/2006   Qualifier: Diagnosis of  By: Eusebio Friendly    . CELLULITIS AND ABSCESS OF LEG EXCEPT FOOT 12/30/2007   Qualifier: Diagnosis of  By: Hassell Done FNP, Tori Milks    . DEPENDENT EDEMA, LEGS, BILATERAL 11/22/2008   Qualifier: Diagnosis of  By: Radene Ou MD, [place]    . Depression 2003Dx  . Edema   . Food allergy    citris  . FOOT PAIN, RIGHT 11/22/2008   Qualifier: Diagnosis of  By: Radene Ou MD, [place]    . Gastric ulcer   . GERD (gastroesophageal reflux disease)   . Gout   . Hypertension   . Joint pain   . KNEE INJURY, LEFT 01/01/2009   Qualifier: Diagnosis of  By: Amil Amen MD, Benjamine Mola    . Migraine   . ONYCHOMYCOSIS, TOENAILS 11/22/2008   Qualifier: Diagnosis of  By: Radene Ou MD, [place]    . Shortness of breath   . Sleep apnea   . TINEA PEDIS 11/22/2008   Qualifier: Diagnosis of  By: Radene Ou MD, [place]      Past Surgical History:  Procedure Laterality Date  . WISDOM TOOTH EXTRACTION  2008   x4    There were no vitals filed for this visit.      Subjective Assessment - 06/24/17 0809    Subjective PT is helpin me walkg a little longer, Stand a little longer,  Get up from sitting  a little easier. I am sleeping a little easier.    Currently in Pain? No/denies   Pain Location Back   Multiple Pain Sites --  knee bursittis L pain , worse with weather.  5/10.  My joints hurt.                         [place] [facility] Adult PT Treatment/Exercise - 06/24/17 0001      Lumbar Exercises: Stretches   Passive Hamstring Stretch 3 reps;30 seconds   Passive Hamstring Stretch Limitations seated   cued to lock back into neutral.,  motion increased   Quadruped Mid Back Stretch Limitations seated childs pose with physioball forward and lateral.      Lumbar Exercises: Aerobic   Stationary Bike Nustep L5 6 minutes UE/LE     Lumbar Exercises: Supine   AB Set Limitations physioball on abdomen , then obliques x 10 each   10 x single and double   Bridge 10 reps   Straight Leg Raise 20 reps   Straight Leg Raises Limitations with abdominal draw in      Lumbar Exercises: Sidelying   Clam 20 reps  both  Hip Abduction 20 reps   Other Sidelying Lumbar Exercises Book opener 1o X     Knee/Hip Exercises: Stretches   Gastroc Stretch 3 reps;30 seconds   Gastroc Stretch Limitations HEP incline X 1 ,  step X 2,  HEP     Moist Heat Therapy   Number Minutes Moist Heat 15 Minutes   Moist Heat Location Lumbar Spine                PT Education - 06/24/17 0852    Education provided Yes   Education Details HEP   Person(s) Educated Patient   Methods Explanation;Demonstration;Verbal cues;Handout   Comprehension Verbalized understanding;Returned demonstration          PT Short Term Goals - 06/24/17 0856      PT SHORT TERM GOAL #1   Title Patient will demsotrate a good core contraction    Baseline good pelvic tilt    Time 4   Period Weeks   Status Achieved     PT SHORT TERM GOAL #2   Title Patient will demsotrate 5/5 bilateral lower extremity strength    Time 4   Period Weeks   Status Unable to assess     PT SHORT TERM GOAL #3   Title Patient will report  centralized lower back pain with no radiation on the right    Baseline No back pain/ radiation.  Consistant?   Time 4   Period Weeks   Status Partially Met     PT SHORT TERM GOAL #4   Title Patient will be independent with basic HEP    Time 4   Period Weeks   Status Achieved           PT Long Term Goals - 06/17/17 2993      PT LONG TERM GOAL #1   Title Patient will sit for 2 hours at work without increased pain and radiation.    Baseline has to reposition at 1.5 hours    Time 8   Period Weeks   Status On-going     PT LONG TERM GOAL #2   Title Patient will walk 3000' without increased pain in order to perfrom IADL's    Baseline 763 ft in clinic    Time 8   Period Weeks   Status On-going     PT LONG TERM GOAL #3   Title Patient will be independent with basic HEP for core stability    Time 8   Period Weeks   Status On-going               Plan - 06/24/17 0854    Clinical Impression Statement Hamstring length improves 2+ inches with stretches. STG#3 partially met. Able to progress her HEP today.  She has 2 more visits scheduled and she feels she will be ready for discharge.  Catherine [name] has shown great progress since this PTA last worked with her.  Mobility and pain greatly improved.  No back pain today.  Notes multile joint soreness with the change in the weather.    PT Treatment/Interventions ADLs/Self Care Home Management;Cryotherapy;Electrical Stimulation;Iontophoresis 64m/ml Dexamethasone;Gait training;Stair training;Ultrasound;Therapeutic activities;Therapeutic exercise;Patient/family education;Passive range of motion;Manual techniques;Taping;Prosthetic Training;Dry needling;Neuromuscular re-education;Moist Heat   PT Next Visit Plan work on gait without cane-check SPO2/ HR;  continue core/hip/manual exercise PRN   PT Home Exercise Plan side bend away from right; seated hamstring stretch, seated clamshell with abdominal brace ,  Calf stretch   Consulted and Agree  with Plan of Care Patient  Patient will benefit from skilled therapeutic intervention in order to improve the following deficits and impairments:     Visit Diagnosis: Chronic right-sided low back pain with right-sided sciatica  Muscle spasm of back     Problem List Patient Active Problem List   Diagnosis Date Noted  . Class 3 obesity with serious comorbidity and body mass index (BMI) of 60.0 to 69.9 in adult ([place]) 06/10/2017  . Fluid retention 06/10/2017  . Menorrhagia 05/18/2017  . Sacral radiculopathy 02/04/2017  . Insulin resistance 02/02/2017  . At risk for violence to self related to depression 02/02/2017  . Morbid obesity ([place]) 01/01/2017  . Sore throat (viral) 11/21/2016  . Vitamin D deficiency 11/12/2016  . Prediabetes 11/12/2016  . Asthma 07/25/2016  . Abdominal pain 06/04/2016  . Otitis, externa, infective 06/04/2016  . Dysuria 03/20/2016  . Gout 11/21/2015  . Obesity, morbid, BMI 50 or higher ([place]) 05/14/2015  . BIPOLAR DISORDER UNSPECIFIED 11/22/2008  . Depression 12/10/2006  . MIGRAINE, UNSPEC., W/O INTRACTABLE MIGRAINE 12/10/2006  . GASTROESOPHAGEAL REFLUX, NO ESOPHAGITIS 12/10/2006  . OSA (obstructive sleep apnea) 12/10/2006    HARRIS,KAREN  PTA 06/24/2017, 9:02 AM  [facility] [address] [place], [place], [address] Phone: [phone]   Fax:  [phone]  Name: Catherine [name] MRN: [id number] Date of Birth: Nov 12, 1980

## 2017-06-24 NOTE — Patient Instructions (Signed)
Issued from exercise drawer: Gastroc stretch.  Using a secure step  Daily 3 x 30 seconds

## 2017-06-25 ENCOUNTER — Ambulatory Visit (INDEPENDENT_AMBULATORY_CARE_PROVIDER_SITE_OTHER): Payer: 59 | Admitting: Physician Assistant

## 2017-06-25 VITALS — BP 105/69 | HR 98 | Temp 98.2°F | Ht 67.0 in | Wt >= 6400 oz

## 2017-06-25 DIAGNOSIS — E669 Obesity, unspecified: Secondary | ICD-10-CM | POA: Diagnosis not present

## 2017-06-25 DIAGNOSIS — E559 Vitamin D deficiency, unspecified: Secondary | ICD-10-CM

## 2017-06-25 DIAGNOSIS — E8881 Metabolic syndrome: Secondary | ICD-10-CM | POA: Diagnosis not present

## 2017-06-25 DIAGNOSIS — IMO0001 Reserved for inherently not codable concepts without codable children: Secondary | ICD-10-CM

## 2017-06-25 DIAGNOSIS — Z9189 Other specified personal risk factors, not elsewhere classified: Secondary | ICD-10-CM

## 2017-06-25 DIAGNOSIS — Z6841 Body Mass Index (BMI) 40.0 and over, adult: Secondary | ICD-10-CM | POA: Diagnosis not present

## 2017-06-25 NOTE — Progress Notes (Signed)
Office: [phone]  /  Fax: [phone]   HPI:   Chief Complaint: OBESITY Catherine [name] is here to discuss her progress with her obesity treatment plan. She is on the Category 3 plan and is following her eating plan approximately 85 % of the time. She states she is exercising 0 minutes 0 times per week. Catherine [name] feels increased swelling and thinks she is retaining fluid, although she is not retaining fluid on her legs. She states she has been taking her Lasix. Catherine [name] will follow up with her PCP to assess the need to increase her Lasix dose. Her weight is (!) 435 lb (197.3 kg) today and has had a weight gain of 4 pounds over a period of 2 weeks since her last visit. She has lost 33 lbs since starting treatment with [name].  Vitamin D deficiency Catherine [name] has a diagnosis of vitamin D deficiency. She is currently taking vit D and denies nausea, vomiting or muscle weakness.  Insulin Resistance Catherine [name] has a diagnosis of insulin resistance based on her elevated fasting insulin level >5. Although Catherine [name]'s blood glucose readings are still under good control, insulin resistance puts her at greater risk of metabolic syndrome and diabetes. She is not taking metformin currently and continues to work on diet and exercise to decrease risk of diabetes.  At risk for diabetes Catherine [name] is at higher than average risk for developing diabetes due to her obesity and insulin resistance She currently denies polyuria or polydipsia.  ALLERGIES: Allergies  Allergen Reactions  . Citrus Swelling    MEDICATIONS: Current Outpatient Prescriptions on File Prior to Visit  Medication Sig Dispense Refill  . ALL DAY ALLERGY 10 MG tablet TAKE 1 TABLET (10 MG TOTAL) BY MOUTH DAILY. 30 tablet 2  . aspirin-acetaminophen-caffeine (EXCEDRIN MIGRAINE) 250-250-65 MG per tablet Take 2 tablets by mouth 2 (two) times daily as needed for migraine. For headache     . budesonide (PULMICORT) 180 MCG/ACT inhaler Inhale 1 puff into the lungs 2  (two) times daily. (Patient taking differently: Inhale 1 puff into the lungs 2 (two) times daily as needed (for shortness of breath). ) 1 Inhaler 1  . buPROPion (WELLBUTRIN SR) 200 MG 12 hr tablet Take 1 tablet (200 mg total) by mouth every morning. 30 tablet 0  . diclofenac (CATAFLAM) 50 MG tablet Take 50 mg by mouth 2 (two) times daily at 10 AM and 5 PM.    . diclofenac sodium (VOLTAREN) 1 % GEL Apply 2 g topically 4 (four) times daily. (Patient taking differently: Apply 2 g topically 4 (four) times daily as needed (for pain.). ) 100 g 1  . furosemide (LASIX) 20 MG tablet TAKE 1 TABLET (20 MG TOTAL) BY MOUTH DAILY. 30 tablet 1  . gabapentin (NEURONTIN) 300 MG capsule Take 2 capsules (600 mg total) by mouth 3 (three) times daily. (Patient taking differently: Take 900 mg by mouth 3 (three) times daily. ) 180 capsule 3  . ibuprofen (ADVIL,MOTRIN) 200 MG tablet Take 600-800 mg by mouth every 8 (eight) hours as needed (for pain.).     Marland Kitchen norgestimate-ethinyl estradiol (ORTHO-CYCLEN,SPRINTEC,PREVIFEM) 0.25-35 MG-MCG tablet Take 1 tablet by mouth daily. (Patient taking differently: Take 1 tablet by mouth daily at 3 pm. ) 1 Package 11  . ranitidine (ZANTAC) 150 MG tablet TAKE 1 TABLET BY MOUTH 2 TIMES DAILY. 60 tablet 0  . Vitamin D, Ergocalciferol, (DRISDOL) 50000 units CAPS capsule Take 1 capsule (50,000 Units total) by mouth every 7 (seven) days. (Patient taking differently: Take 50,000  Units by mouth every Tuesday. ) 4 capsule 0   No current facility-administered medications on file prior to visit.     PAST MEDICAL HISTORY: Past Medical History:  Diagnosis Date  . Allergy   . Anxiety   . Arthritis   . Asthma    since baby  . ASTHMA, UNSPECIFIED 12/10/2006   Qualifier: Diagnosis of  By: Abundio Miu    . CELLULITIS AND ABSCESS OF LEG EXCEPT FOOT 12/30/2007   Qualifier: Diagnosis of  By: Daphine Deutscher FNP, Zena Amos    . DEPENDENT EDEMA, LEGS, BILATERAL 11/22/2008   Qualifier: Diagnosis of  By:  Barbaraann Barthel MD, [place]    . Depression 2003Dx  . Edema   . Food allergy    citris  . FOOT PAIN, RIGHT 11/22/2008   Qualifier: Diagnosis of  By: Barbaraann Barthel MD, [place]    . Gastric ulcer   . GERD (gastroesophageal reflux disease)   . Gout   . Hypertension   . Joint pain   . KNEE INJURY, LEFT 01/01/2009   Qualifier: Diagnosis of  By: Delrae Alfred MD, Lanora Manis    . Migraine   . ONYCHOMYCOSIS, TOENAILS 11/22/2008   Qualifier: Diagnosis of  By: Barbaraann Barthel MD, [place]    . Shortness of breath   . Sleep apnea   . TINEA PEDIS 11/22/2008   Qualifier: Diagnosis of  By: Barbaraann Barthel MD, Benetta Spar      PAST SURGICAL HISTORY: Past Surgical History:  Procedure Laterality Date  . WISDOM TOOTH EXTRACTION  2008   x4    SOCIAL HISTORY: Social History  Substance Use Topics  . Smoking status: Former Smoker    Packs/day: 0.50    Years: 18.00    Types: Cigarettes    Start date: 10/13/1996    Quit date: 01/12/2015  . Smokeless tobacco: Never Used  . Alcohol use 0.6 oz/week    1 Standard drinks or equivalent per week     Comment: rarely drinks    FAMILY HISTORY: Family History  Problem Relation Age of Onset  . Hypertension Mother   . Hyperlipidemia Mother   . Cancer Mother        bone cancer  . Heart disease Mother   . Sudden death Mother   . Stroke Mother   . Thyroid disease Mother   . Sleep apnea Mother   . Obesity Mother   . Sudden death Father   . Hypertension Father   . Heart disease Father   . Hyperlipidemia Father   . Prostate cancer Father   . Lung cancer Father   . Obesity Father   . Alcoholism Father   . Sudden death Brother   . Hypertension Brother   . Hyperlipidemia Brother   . Heart attack Brother   . Heart disease Brother   . Stroke Brother   . Alcoholism Brother   . Drug abuse Brother   . Heart disease Sister   . Hypertension Sister   . Thyroid cancer Sister   . Diabetes Neg Hx   . Colon cancer Neg Hx   . Stomach cancer Neg Hx   . Esophageal cancer Neg Hx   . Rectal  cancer Neg Hx   . Liver cancer Neg Hx     ROS: Review of Systems  Constitutional: Negative for weight loss.  Gastrointestinal: Negative for nausea and vomiting.  Genitourinary: Negative for frequency.  Musculoskeletal:       Negative muscle weakness  Endo/Heme/Allergies: Negative for polydipsia.    PHYSICAL EXAM: Blood pressure 105/69, pulse  98, temperature 98.2 F (36.8 C), height 5\' 7"  (1.702 m), weight (!) 435 lb (197.3 kg), last menstrual period 06/21/2017, SpO2 98 %. Body mass index is 68.13 kg/m. Physical Exam  Constitutional: She is oriented to person, place, and time. She appears well-developed and well-nourished.  Cardiovascular: Normal rate.   Pulmonary/Chest: Effort normal.  Musculoskeletal: Normal range of motion.  Neurological: She is oriented to person, place, and time.  Skin: Skin is warm and dry.  Psychiatric: She has a normal mood and affect. Her behavior is normal.  Vitals reviewed.   RECENT LABS AND TESTS: BMET    Component Value Date/Time   NA 140 01/20/2017 0831   K 4.4 01/20/2017 0831   CL 102 01/20/2017 0831   CO2 23 01/20/2017 0831   GLUCOSE 80 01/20/2017 0831   GLUCOSE 83 06/04/2016 1155   BUN 16 01/20/2017 0831   CREATININE 0.74 01/20/2017 0831   CREATININE 0.90 06/04/2016 1155   CALCIUM 9.3 01/20/2017 0831   GFRNONAA 105 01/20/2017 0831   GFRNONAA 84 06/04/2016 1155   GFRAA 121 01/20/2017 0831   GFRAA >89 06/04/2016 1155   Lab Results  Component Value Date   HGBA1C 5.2 05/19/2017   HGBA1C 5.4 01/20/2017   HGBA1C 5.6 10/27/2016   HGBA1C 5.0 11/21/2015   Lab Results  Component Value Date   INSULIN 29.7 (H) 01/20/2017   INSULIN 30.1 (H) 10/27/2016   CBC    Component Value Date/Time   WBC 9.7 05/18/2017 1611   WBC 13.4 (H) 06/04/2016 1155   RBC 4.17 05/18/2017 1611   RBC 4.37 06/04/2016 1155   HGB 11.2 05/18/2017 1611   HCT 35.6 05/18/2017 1611   PLT 343 05/18/2017 1611   MCV 85 05/18/2017 1611   MCH 26.9 05/18/2017 1611     MCH 27.7 06/04/2016 1155   MCHC 31.5 05/18/2017 1611   MCHC 32.4 06/04/2016 1155   RDW 14.0 05/18/2017 1611   LYMPHSABS 2.9 01/20/2017 0831   MONOABS 1.3 (H) 09/21/2013 1635   EOSABS 0.1 01/20/2017 0831   BASOSABS 0.0 01/20/2017 0831   Iron/TIBC/Ferritin/ %Sat    Component Value Date/Time   IRON 25 (L) 05/18/2017 1611   TIBC 363 05/18/2017 1611   FERRITIN 16 05/18/2017 1611   IRONPCTSAT 7 (LL) 05/18/2017 1611   Lipid Panel     Component Value Date/Time   CHOL 152 10/27/2016 1058   TRIG 57 10/27/2016 1058   HDL 43 10/27/2016 1058   CHOLHDL 3.6 11/21/2015 1047   VLDL 10 11/21/2015 1047   LDLCALC 98 10/27/2016 1058   Hepatic Function Panel     Component Value Date/Time   PROT 6.6 01/20/2017 0831   ALBUMIN 3.9 01/20/2017 0831   AST 21 01/20/2017 0831   ALT 19 01/20/2017 0831   ALKPHOS 65 01/20/2017 0831   BILITOT 0.3 01/20/2017 0831      Component Value Date/Time   TSH 1.43 05/19/2017 1245   TSH 1.460 10/27/2016 1058   TSH 0.96 11/21/2015 1047    ASSESSMENT AND PLAN: Insulin resistance - Plan: Comprehensive metabolic panel, Hemoglobin A1c, Insulin, random  Vitamin D deficiency - Plan: VITAMIN D 25 Hydroxy (Vit-D Deficiency, Fractures), Vitamin D, Ergocalciferol, (DRISDOL) 50000 units CAPS capsule  At risk for diabetes mellitus  Class 3 obesity with serious comorbidity and body mass index (BMI) of 60.0 to 69.9 in adult, unspecified obesity type (HCC)  PLAN:  Vitamin D Deficiency Catherine [name] was informed that low vitamin D levels contributes to fatigue and are associated with obesity,  breast, and colon cancer. She agrees to continue to take prescription Vit D ,000 IU every week #4 with no refills and we will check labs and will follow up for routine testing of vitamin D, at least 2-3 times per year. She was informed of the risk of over-replacement of vitamin D and agrees to not increase her dose unless he discusses this with [name] first. Catherine [name] agrees to follow up  with our clinic in 3 weeks.  Insulin Resistance Catherine [name] will continue to work on weight loss, exercise, and decreasing simple carbohydrates in her diet to help decrease the risk of diabetes. She was informed that eating too many simple carbohydrates or too many calories at one sitting increases the likelihood of GI side effects. We will check labs and Catherine [name] agreed to follow up with [name] as directed to monitor her progress.  Diabetes risk counseling Catherine [name] was given extended (15 minutes) diabetes prevention counseling today. She is 36 y.o. female and has risk factors for diabetes including obesity and insulin resistance. We discussed intensive lifestyle modifications today with an emphasis on weight loss as well as increasing exercise and decreasing simple carbohydrates in her diet.  Obesity Catherine [name] is currently in the action stage of change. As such, her goal is to continue with weight loss efforts She has agreed to follow the Category 3 plan Catherine [name] has been instructed to work up to a goal of 150 minutes of combined cardio and strengthening exercise per week for weight loss and overall health benefits. We discussed the following Behavioral Modification Strategies today: increasing lean protein intake and work on meal planning and easy cooking plans  [place] has agreed to follow up with our clinic in 3 weeks. She was informed of the importance of frequent follow up visits to maximize her success with intensive lifestyle modifications for her multiple health conditions.  I, [place] Crane, am acting as transcriptionist for Illa Level, PA-C  I have reviewed the above documentation for accuracy and completeness, and I agree with the above. -Illa Level, PA-C  I have reviewed the above note and agree with the plan. -Catherine Quince, MD   OBESITY BEHAVIORAL INTERVENTION VISIT  Today's visit was # 16 out of 22.  Starting weight: 468 lbs Starting date: 10/27/16 Today's weight : 435 lbs Today's  date: 06/25/2017 Total lbs lost to date: 56 (Patients must lose 7 lbs in the first 6 months to continue with counseling)   ASK: We discussed the diagnosis of obesity with [name] today and Kaliyah agreed to give [name] permission to discuss obesity behavioral modification therapy today.  ASSESS: Catherine [name] has the diagnosis of obesity and her BMI today is 68.11 Micaiah is in the action stage of change   ADVISE: Nyia was educated on the multiple health risks of obesity as well as the benefit of weight loss to improve her health. She was advised of the need for long term treatment and the importance of lifestyle modifications.  AGREE: Multiple dietary modification options and treatment options were discussed and  Weda agreed to follow the Category 3 plan We discussed the following Behavioral Modification Strategies today: increasing lean protein intake and work on meal planning and easy cooking plans

## 2017-06-26 ENCOUNTER — Inpatient Hospital Stay (HOSPITAL_COMMUNITY): Admission: RE | Admit: 2017-06-26 | Payer: 59 | Source: Ambulatory Visit

## 2017-06-26 LAB — HEMOGLOBIN A1C
Est. average glucose Bld gHb Est-mCnc: 108 mg/dL
Hgb A1c MFr Bld: 5.4 % (ref 4.8–5.6)

## 2017-06-26 LAB — INSULIN, RANDOM: INSULIN: 30.4 u[IU]/mL — ABNORMAL HIGH (ref 2.6–24.9)

## 2017-06-26 LAB — VITAMIN D 25 HYDROXY (VIT D DEFICIENCY, FRACTURES): Vit D, 25-Hydroxy: 35.7 ng/mL (ref 30.0–100.0)

## 2017-06-26 LAB — COMPREHENSIVE METABOLIC PANEL
ALT: 57 IU/L — ABNORMAL HIGH (ref 0–32)
AST: 39 IU/L (ref 0–40)
Albumin/Globulin Ratio: 1.3 (ref 1.2–2.2)
Albumin: 3.9 g/dL (ref 3.5–5.5)
Alkaline Phosphatase: 61 IU/L (ref 39–117)
BUN/Creatinine Ratio: 17 (ref 9–23)
BUN: 12 mg/dL (ref 6–20)
Bilirubin Total: 0.3 mg/dL (ref 0.0–1.2)
CO2: 21 mmol/L (ref 20–29)
Calcium: 9 mg/dL (ref 8.7–10.2)
Chloride: 104 mmol/L (ref 96–106)
Creatinine, Ser: 0.7 mg/dL (ref 0.57–1.00)
GFR calc Af Amer: 130 mL/min/{1.73_m2} (ref >59)
GFR calc non Af Amer: 113 mL/min/{1.73_m2} (ref >59)
Globulin, Total: 3.1 g/dL (ref 1.5–4.5)
Glucose: 82 mg/dL (ref 65–99)
Potassium: 4.5 mmol/L (ref 3.5–5.2)
Sodium: 139 mmol/L (ref 134–144)
Total Protein: 7 g/dL (ref 6.0–8.5)

## 2017-06-29 ENCOUNTER — Ambulatory Visit: Payer: 59 | Admitting: Physical Therapy

## 2017-06-29 DIAGNOSIS — M62830 Muscle spasm of back: Secondary | ICD-10-CM

## 2017-06-29 DIAGNOSIS — G8929 Other chronic pain: Secondary | ICD-10-CM | POA: Diagnosis not present

## 2017-06-29 DIAGNOSIS — M5441 Lumbago with sciatica, right side: Principal | ICD-10-CM

## 2017-06-29 MED ORDER — VITAMIN D (ERGOCALCIFEROL) 1.25 MG (50000 UNIT) PO CAPS: 50000.0000 [IU] | ORAL_CAPSULE | ORAL | 0 refills | Status: DC

## 2017-06-29 MED FILL — VIT D2 1.25 MG (50,000 UNIT: 1.25 MG | 28 days supply | Qty: 4 | Fill #0

## 2017-06-29 NOTE — Patient Instructions (Addendum)
Your procedure is scheduled on:  Friday, Sept. 21, 2018  Enter through the [employer] [facility] at:  6:00 AM  Pick up the phone at the desk and dial [phone].  Call this number if you have problems the morning of surgery: [phone].  Remember: Do NOT eat food or drink after:  Midnight Thursday  Take these medicines the morning of surgery with a SIP OF WATER:  Wellbutrin, Gabapentin, Zantac, Use inhalers per normal routine  Bring rescue Asthma inhaler day of surgery  Bring CPAP day of surgery  Stop ALL herbal medications at this time  Do NOT smoke the day of surgery.  Do NOT wear jewelry (body piercing), metal hair clips/bobby pins, make-up, artifical eyelashes or nail polish. Do NOT wear lotions, powders, or perfumes.  You may wear deodorant. Do NOT shave for 48 hours prior to surgery. Do NOT bring valuables to the hospital. Contacts, dentures, or bridgework may not be worn into surgery.  Have a responsible adult drive you home and stay with you for 24 hours after your procedure  Bring a copy of your healthcare power of attorney and living will documents.

## 2017-06-29 NOTE — Therapy (Signed)
[place], [place], [address] Phone: [phone]   Fax:  [phone]  Physical Therapy Treatment  Patient Details  Name: Catherine [name] MRN: [id number] Date of Birth: 23-Dec-1980 Referring Provider: Consuella Lose MD   Encounter Date: 06/29/2017      PT End of Session - 06/29/17 0851    Visit Number 11   Number of Visits 16   Date for PT Re-Evaluation 07/09/17   Authorization Type [facility]    PT Start Time 0846   PT Stop Time 0940   PT Time Calculation (min) 54 min      Past Medical History:  Diagnosis Date  . Allergy   . Anxiety   . Arthritis   . Asthma    since baby  . ASTHMA, UNSPECIFIED 12/10/2006   Qualifier: Diagnosis of  By: Eusebio Friendly    . CELLULITIS AND ABSCESS OF LEG EXCEPT FOOT 12/30/2007   Qualifier: Diagnosis of  By: Hassell Done FNP, Tori Milks    . DEPENDENT EDEMA, LEGS, BILATERAL 11/22/2008   Qualifier: Diagnosis of  By: Radene Ou MD, [place]    . Depression 2003Dx  . Edema   . Food allergy    citris  . FOOT PAIN, RIGHT 11/22/2008   Qualifier: Diagnosis of  By: Radene Ou MD, [place]    . Gastric ulcer   . GERD (gastroesophageal reflux disease)   . Gout   . Hypertension   . Joint pain   . KNEE INJURY, LEFT 01/01/2009   Qualifier: Diagnosis of  By: Amil Amen MD, Benjamine Mola    . Migraine   . ONYCHOMYCOSIS, TOENAILS 11/22/2008   Qualifier: Diagnosis of  By: Radene Ou MD, [place]    . Shortness of breath   . Sleep apnea   . TINEA PEDIS 11/22/2008   Qualifier: Diagnosis of  By: Radene Ou MD, [place]      Past Surgical History:  Procedure Laterality Date  . WISDOM TOOTH EXTRACTION  2008   x4    There were no vitals filed for this visit.      Subjective Assessment - 06/29/17 0939    Subjective Still feel like this will be my last week.    Pain Score 3    Pain Location Back   Pain Orientation Left   Pain Descriptors / Indicators Aching   Aggravating Factors  prolonged  positions   Pain Relieving Factors exercise, stretching, heat                         [facility] Adult PT Treatment/Exercise - 06/29/17 0001      Lumbar Exercises: Stretches   Passive Hamstring Stretch 3 reps;30 seconds   Passive Hamstring Stretch Limitations seated    Pelvic Tilt Limitations 10 reps 2 sets hooklying   seated and supine, ball in hookying    Quadruped Mid Back Stretch Limitations seated childs pose with physioball forward and lateral.      Lumbar Exercises: Aerobic   Stationary Bike Nustep L5 6 minutes UE/LE     Lumbar Exercises: Seated   LAQ on Chair Limitations 10 x 2 alternating with abdominal bracing      Lumbar Exercises: Supine   Clam 10 reps   Clam Limitations with PPT    Heel Slides 10 reps   Heel Slides Limitations with PPT    Bent Knee Raise 10 reps   Bent Knee Raise Limitations with PPT    Bridge 10 reps   Straight Leg Raise  20 reps   Straight Leg Raises Limitations with abdominal draw in      Lumbar Exercises: Sidelying   Hip Abduction 20 reps     Knee/Hip Exercises: Stretches   Gastroc Stretch 3 reps;30 seconds                  PT Short Term Goals - 06/24/17 0856      PT SHORT TERM GOAL #1   Title Patient will demsotrate a good core contraction    Baseline good pelvic tilt    Time 4   Period Weeks   Status Achieved     PT SHORT TERM GOAL #2   Title Patient will demsotrate 5/5 bilateral lower extremity strength    Time 4   Period Weeks   Status Unable to assess     PT SHORT TERM GOAL #3   Title Patient will report centralized lower back pain with no radiation on the right    Baseline No back pain/ radiation.  Consistant?   Time 4   Period Weeks   Status Partially Met     PT SHORT TERM GOAL #4   Title Patient will be independent with basic HEP    Time 4   Period Weeks   Status Achieved           PT Long Term Goals - 06/17/17 4332      PT LONG TERM GOAL #1   Title Patient will sit for 2 hours at  work without increased pain and radiation.    Baseline has to reposition at 1.5 hours    Time 8   Period Weeks   Status On-going     PT LONG TERM GOAL #2   Title Patient will walk 3000' without increased pain in order to perfrom IADL's    Baseline 763 ft in clinic    Time 8   Period Weeks   Status On-going     PT LONG TERM GOAL #3   Title Patient will be independent with basic HEP for core stability    Time 8   Period Weeks   Status On-going               Plan - 06/29/17 0936    Clinical Impression Statement Pt reports achiness in lower legs today due to the weather. She notes improved hip/core endurance with therex.    PT Next Visit Plan review, check, goals and DC    PT Home Exercise Plan side bend away from right; seated hamstring stretch, seated clamshell with abdominal brace ,  Calf stretch   Consulted and Agree with Plan of Care Patient      Patient will benefit from skilled therapeutic intervention in order to improve the following deficits and impairments:  Abnormal gait, Decreased activity tolerance, Decreased strength, Pain, Obesity, Increased muscle spasms, Decreased range of motion  Visit Diagnosis: Chronic right-sided low back pain with right-sided sciatica  Muscle spasm of back     Problem List Patient Active Problem List   Diagnosis Date Noted  . Class 3 obesity with serious comorbidity and body mass index (BMI) of 60.0 to 69.9 in adult ([place]) 06/10/2017  . Fluid retention 06/10/2017  . Menorrhagia 05/18/2017  . Sacral radiculopathy 02/04/2017  . Insulin resistance 02/02/2017  . At risk for violence to self related to depression 02/02/2017  . Morbid obesity ([place]) 01/01/2017  . Sore throat (viral) 11/21/2016  . Vitamin D deficiency 11/12/2016  . Prediabetes 11/12/2016  . Asthma  07/25/2016  . Abdominal pain 06/04/2016  . Otitis, externa, infective 06/04/2016  . Dysuria 03/20/2016  . Gout 11/21/2015  . Obesity, morbid, BMI 50 or higher ([place])  05/14/2015  . BIPOLAR DISORDER UNSPECIFIED 11/22/2008  . Depression 12/10/2006  . MIGRAINE, UNSPEC., W/O INTRACTABLE MIGRAINE 12/10/2006  . GASTROESOPHAGEAL REFLUX, NO ESOPHAGITIS 12/10/2006  . OSA (obstructive sleep apnea) 12/10/2006    Dorene Ar , PTA 06/29/2017, 9:40 AM  [place], [place], [address] Phone: [phone]   Fax:  [phone]  Name: Catherine [name] MRN: [id number] Date of Birth: 03/19/1981

## 2017-06-30 ENCOUNTER — Encounter (HOSPITAL_COMMUNITY): Payer: Self-pay

## 2017-06-30 ENCOUNTER — Encounter (HOSPITAL_COMMUNITY)
Admission: RE | Admit: 2017-06-30 | Discharge: 2017-06-30 | Disposition: A | Discharge status: Home / Self Care | Payer: 59 | Source: Ambulatory Visit | Attending: Obstetrics & Gynecology | Admitting: Obstetrics & Gynecology

## 2017-06-30 DIAGNOSIS — N921 Excessive and frequent menstruation with irregular cycle: Secondary | ICD-10-CM | POA: Diagnosis not present

## 2017-06-30 DIAGNOSIS — M109 Gout, unspecified: Secondary | ICD-10-CM | POA: Diagnosis not present

## 2017-06-30 DIAGNOSIS — F329 Major depressive disorder, single episode, unspecified: Secondary | ICD-10-CM | POA: Diagnosis not present

## 2017-06-30 DIAGNOSIS — N946 Dysmenorrhea, unspecified: Secondary | ICD-10-CM | POA: Diagnosis not present

## 2017-06-30 DIAGNOSIS — Z791 Long term (current) use of non-steroidal anti-inflammatories (NSAID): Secondary | ICD-10-CM | POA: Diagnosis not present

## 2017-06-30 DIAGNOSIS — Z01818 Encounter for other preprocedural examination: Secondary | ICD-10-CM | POA: Insufficient documentation

## 2017-06-30 DIAGNOSIS — K219 Gastro-esophageal reflux disease without esophagitis: Secondary | ICD-10-CM | POA: Diagnosis not present

## 2017-06-30 DIAGNOSIS — Z87891 Personal history of nicotine dependence: Secondary | ICD-10-CM | POA: Diagnosis not present

## 2017-06-30 DIAGNOSIS — Z79899 Other long term (current) drug therapy: Secondary | ICD-10-CM | POA: Diagnosis not present

## 2017-06-30 DIAGNOSIS — F419 Anxiety disorder, unspecified: Secondary | ICD-10-CM | POA: Diagnosis not present

## 2017-06-30 DIAGNOSIS — I10 Essential (primary) hypertension: Secondary | ICD-10-CM | POA: Diagnosis not present

## 2017-06-30 HISTORY — DX: Other cyst of bone, unspecified site: M85.60

## 2017-06-30 LAB — CBC
HCT: 34.9 % — ABNORMAL LOW (ref 36.0–46.0)
Hemoglobin: 11.3 g/dL — ABNORMAL LOW (ref 12.0–15.0)
MCH: 26 pg (ref 26.0–34.0)
MCHC: 32.4 g/dL (ref 30.0–36.0)
MCV: 80.2 fL (ref 78.0–100.0)
Platelets: 278 10*3/uL (ref 150–400)
RBC: 4.35 MIL/uL (ref 3.87–5.11)
RDW: 14.6 % (ref 11.5–15.5)
WBC: 12.7 10*3/uL — ABNORMAL HIGH (ref 4.0–10.5)

## 2017-06-30 NOTE — Pre-Procedure Instructions (Signed)
Anesthesia consult was done with Dr. Rondall Allegra per patient request.

## 2017-07-01 ENCOUNTER — Ambulatory Visit: Payer: 59 | Admitting: Physical Therapy

## 2017-07-01 ENCOUNTER — Encounter: Payer: Self-pay | Admitting: Physical Therapy

## 2017-07-01 ENCOUNTER — Other Ambulatory Visit: Payer: Self-pay | Admitting: Internal Medicine

## 2017-07-01 DIAGNOSIS — G8929 Other chronic pain: Secondary | ICD-10-CM

## 2017-07-01 DIAGNOSIS — M62830 Muscle spasm of back: Secondary | ICD-10-CM

## 2017-07-01 DIAGNOSIS — M5441 Lumbago with sciatica, right side: Secondary | ICD-10-CM | POA: Diagnosis not present

## 2017-07-02 ENCOUNTER — Encounter: Payer: Self-pay | Admitting: Physical Therapy

## 2017-07-02 MED FILL — raNITIdine HCL 150 MG TABS: 150 | 30 days supply | Qty: 60 | Fill #0

## 2017-07-02 NOTE — Therapy (Signed)
[place], [place], [address] Phone: [phone]   Fax:  [phone]  Physical Therapy Treatment  Patient Details  Name: Catherine [name] MRN: [id number] Date of Birth: 1981-02-27 Referring Provider: Consuella Lose MD   Encounter Date: 07/01/2017      PT End of Session - 07/01/17 0852    Visit Number 12   Number of Visits 16   Date for PT Re-Evaluation 07/09/17   Authorization Type [facility]    PT Start Time 0845   PT Stop Time 0923   PT Time Calculation (min) 38 min   Activity Tolerance Patient tolerated treatment well   Behavior During Therapy [facility] for tasks assessed/performed      Past Medical History:  Diagnosis Date  . Allergy   . Anxiety   . Arthritis   . Asthma    since baby, seasonal  . ASTHMA, UNSPECIFIED 12/10/2006   Qualifier: Diagnosis of  By: Eusebio Friendly    . CELLULITIS AND ABSCESS OF LEG EXCEPT FOOT 12/30/2007   Qualifier: Diagnosis of  By: Hassell Done FNP, Tori Milks    . Cyst of bone    right side of spine  . DEPENDENT EDEMA, LEGS, BILATERAL 11/22/2008   Qualifier: Diagnosis of  By: Radene Ou MD, [place]    . Depression 2003Dx  . Edema   . Food allergy    citris  . FOOT PAIN, RIGHT 11/22/2008   Qualifier: Diagnosis of  By: Radene Ou MD, [place]    . Gastric ulcer   . GERD (gastroesophageal reflux disease)   . Gout   . Hypertension    took Norvasc for 3 months, doctor discontinued no longer on BP medications  . Joint pain   . KNEE INJURY, LEFT 01/01/2009   Qualifier: Diagnosis of  By: Amil Amen MD, Benjamine Mola    . Migraine   . ONYCHOMYCOSIS, TOENAILS 11/22/2008   Qualifier: Diagnosis of  By: Radene Ou MD, [place]    . Shortness of breath   . Sleep apnea   . TINEA PEDIS 11/22/2008   Qualifier: Diagnosis of  By: Radene Ou MD, [place]      Past Surgical History:  Procedure Laterality Date  . UPPER GI ENDOSCOPY    . WISDOM TOOTH EXTRACTION  2008   x4    There were no vitals  filed for this visit.      Subjective Assessment - 07/01/17 0849    Subjective Patient reports she is no longer having radiating pain into her legs. She has been working on her exercises at home. he feels comfortable with her exercise program. She woill continue on her own.    Limitations Sitting;Standing;Walking   How long can you sit comfortably? < 2 hours    How long can you walk comfortably? limited community distances with pain    Diagnostic tests MRI: L5 S1 Cyst compressing on the L1 nerve root    Currently in Pain? Yes   Pain Score 2    Pain Location Knee   Pain Orientation Left   Pain Descriptors / Indicators Aching   Pain Type Chronic pain   Pain Onset More than a month ago   Pain Frequency Constant   Aggravating Factors  prolonged positioning    Pain Relieving Factors exercises, stretching, heat    Effect of Pain on Daily Activities limited walking, limited ADL's  [place] Adult PT Treatment/Exercise - 07/02/17 0001      Lumbar Exercises: Stretches   Passive Hamstring Stretch 3 reps;30 seconds   Passive Hamstring Stretch Limitations seated    Pelvic Tilt Limitations 10 reps 2 sets hooklying   seated and supine, ball in hookying    Quadruped Mid Back Stretch Limitations seated childs pose with physioball forward and lateral.      Lumbar Exercises: Aerobic   Stationary Bike Nustep L5 6 minutes UE/LE     Lumbar Exercises: Seated   LAQ on Chair Limitations 10 x 2 alternating with abdominal bracing      Lumbar Exercises: Supine   Clam 10 reps   Clam Limitations with PPT    Heel Slides 10 reps   Heel Slides Limitations with PPT    Bent Knee Raise 10 reps   Bent Knee Raise Limitations with PPT    Bridge 10 reps   Straight Leg Raise 20 reps   Straight Leg Raises Limitations with abdominal draw in      Knee/Hip Exercises: Stretches   Gastroc Stretch 3 reps;30 seconds                PT Education - 07/01/17 0914     Education provided Yes   Education Details reviewed HEP; reviewed exercise progression    Person(s) Educated Patient   Methods Explanation;Tactile cues;Demonstration;Verbal cues   Comprehension Verbalized understanding;Returned demonstration;Verbal cues required;Tactile cues required          PT Short Term Goals - 07/02/17 0727      PT SHORT TERM GOAL #1   Title Patient will demsotrate a good core contraction    Baseline good pelvic tilt    Time 4   Period Weeks   Status Achieved     PT SHORT TERM GOAL #2   Title Patient will demsotrate 5/5 bilateral lower extremity strength    Baseline Some 4+/5   Time 4   Period Weeks   Status Partially Met     PT SHORT TERM GOAL #3   Title Patient will report centralized lower back pain with no radiation on the right    Baseline No back pain/ radiation.     Time 4   Period Weeks   Status Achieved     PT SHORT TERM GOAL #4   Title Patient will be independent with basic HEP    Time 4   Period Weeks   Status Achieved           PT Long Term Goals - 07/02/17 5400      PT LONG TERM GOAL #1   Title Patient will sit for 2 hours at work without increased pain and radiation.    Baseline can sit for about 2 hours before she has to get up and walk around    Time 8   Period Weeks   Status Achieved     PT LONG TERM GOAL #2   Title Patient will walk 3000' without increased pain in order to perfrom IADL's    Baseline can walk about 1500' without increased pain. progressing gait distances    Time 8   Period Weeks   Status Achieved     PT LONG TERM GOAL #3   Title Patient will be independent with basic HEP for core stability    Time 8   Period Weeks   Status Achieved               Plan - 07/02/17 0725  Clinical Impression Statement Patient has reached goals for therapy. She still has minor pain in her knees and back at times but this is baseline. She was encouraged to continue working on exercises and weight loss to  prevent further isssues with her back. The patient is motivated to do so.    Clinical Presentation Stable   Clinical Decision Making Moderate   Rehab Potential Good   PT Frequency 2x / week   PT Duration 8 weeks   PT Treatment/Interventions ADLs/Self Care Home Management;Cryotherapy;Electrical Stimulation;Iontophoresis '4mg'$ /ml Dexamethasone;Gait training;Stair training;Ultrasound;Therapeutic activities;Therapeutic exercise;Patient/family education;Passive range of motion;Manual techniques;Taping;Prosthetic Training;Dry needling;Neuromuscular re-education;Moist Heat   PT Next Visit Plan D/C   PT Home Exercise Plan side bend away from right; seated hamstring stretch, seated clamshell with abdominal brace ,  Calf stretch   Consulted and Agree with Plan of Care Patient      Patient will benefit from skilled therapeutic intervention in order to improve the following deficits and impairments:  Abnormal gait, Decreased activity tolerance, Decreased strength, Pain, Obesity, Increased muscle spasms, Decreased range of motion  Visit Diagnosis: Chronic right-sided low back pain with right-sided sciatica  Muscle spasm of back  PHYSICAL THERAPY DISCHARGE SUMMARY  Visits from Start of Care: 12  Current functional level related to goals / functional outcomes: Improved ability to sleep, sit, and ambulate  Deficits Continued aches and pains but likely baseline 2nd to weight    Education / Equipment: HEP Plan: Patient agrees to discharge.  Patient goals were not met. Patient is being discharged due to meeting the stated rehab goals.  ?????       Problem List Patient Active Problem List   Diagnosis Date Noted  . Class 3 obesity with serious comorbidity and body mass index (BMI) of 60.0 to 69.9 in adult ([place]) 06/10/2017  . Fluid retention 06/10/2017  . Menorrhagia 05/18/2017  . Sacral radiculopathy 02/04/2017  . Insulin resistance 02/02/2017  . At risk for violence to self related to  depression 02/02/2017  . Morbid obesity ([place]) 01/01/2017  . Sore throat (viral) 11/21/2016  . Vitamin D deficiency 11/12/2016  . Prediabetes 11/12/2016  . Asthma 07/25/2016  . Abdominal pain 06/04/2016  . Otitis, externa, infective 06/04/2016  . Dysuria 03/20/2016  . Gout 11/21/2015  . Obesity, morbid, BMI 50 or higher ([place]) 05/14/2015  . BIPOLAR DISORDER UNSPECIFIED 11/22/2008  . Depression 12/10/2006  . MIGRAINE, UNSPEC., W/O INTRACTABLE MIGRAINE 12/10/2006  . GASTROESOPHAGEAL REFLUX, NO ESOPHAGITIS 12/10/2006  . OSA (obstructive sleep apnea) 12/10/2006    Carney Living PT DPT  07/02/2017, 7:30 AM  [facility] [address] [place], [place], [address] Phone: [phone]   Fax:  [phone]  Name: CHASSIDY LAYSON MRN: [id number] Date of Birth: 29-Mar-1981

## 2017-07-03 ENCOUNTER — Ambulatory Visit (HOSPITAL_COMMUNITY): Payer: 59 | Admitting: Anesthesiology

## 2017-07-03 ENCOUNTER — Encounter (HOSPITAL_COMMUNITY)
Admission: AD | Disposition: A | Discharge status: Home / Self Care | Payer: Self-pay | Source: Ambulatory Visit | Attending: Obstetrics & Gynecology

## 2017-07-03 ENCOUNTER — Encounter (HOSPITAL_COMMUNITY): Payer: Self-pay

## 2017-07-03 ENCOUNTER — Ambulatory Visit (HOSPITAL_COMMUNITY)
Admission: AD | Admit: 2017-07-03 | Discharge: 2017-07-03 | Disposition: A | Discharge status: Home / Self Care | Payer: 59 | Source: Ambulatory Visit | Attending: Obstetrics & Gynecology | Admitting: Obstetrics & Gynecology

## 2017-07-03 DIAGNOSIS — N946 Dysmenorrhea, unspecified: Secondary | ICD-10-CM | POA: Insufficient documentation

## 2017-07-03 DIAGNOSIS — N921 Excessive and frequent menstruation with irregular cycle: Secondary | ICD-10-CM | POA: Diagnosis not present

## 2017-07-03 DIAGNOSIS — K219 Gastro-esophageal reflux disease without esophagitis: Secondary | ICD-10-CM | POA: Insufficient documentation

## 2017-07-03 DIAGNOSIS — I10 Essential (primary) hypertension: Secondary | ICD-10-CM | POA: Diagnosis not present

## 2017-07-03 DIAGNOSIS — F418 Other specified anxiety disorders: Secondary | ICD-10-CM | POA: Diagnosis not present

## 2017-07-03 DIAGNOSIS — M109 Gout, unspecified: Secondary | ICD-10-CM | POA: Insufficient documentation

## 2017-07-03 DIAGNOSIS — Z87891 Personal history of nicotine dependence: Secondary | ICD-10-CM | POA: Insufficient documentation

## 2017-07-03 DIAGNOSIS — F419 Anxiety disorder, unspecified: Secondary | ICD-10-CM | POA: Insufficient documentation

## 2017-07-03 DIAGNOSIS — N840 Polyp of corpus uteri: Secondary | ICD-10-CM | POA: Diagnosis not present

## 2017-07-03 DIAGNOSIS — Z79899 Other long term (current) drug therapy: Secondary | ICD-10-CM | POA: Insufficient documentation

## 2017-07-03 DIAGNOSIS — Z791 Long term (current) use of non-steroidal anti-inflammatories (NSAID): Secondary | ICD-10-CM | POA: Insufficient documentation

## 2017-07-03 DIAGNOSIS — F329 Major depressive disorder, single episode, unspecified: Secondary | ICD-10-CM | POA: Insufficient documentation

## 2017-07-03 HISTORY — PX: DILITATION & CURRETTAGE/HYSTROSCOPY WITH NOVASURE ABLATION: SHX5568

## 2017-07-03 LAB — CBC WITH DIFFERENTIAL/PLATELET
Basophils Absolute: 0 10*3/uL (ref 0.0–0.1)
Basophils Relative: 0 %
Eosinophils Absolute: 0.1 10*3/uL (ref 0.0–0.7)
Eosinophils Relative: 1 %
HCT: 34.6 % — ABNORMAL LOW (ref 36.0–46.0)
Hemoglobin: 11.2 g/dL — ABNORMAL LOW (ref 12.0–15.0)
Lymphocytes Relative: 33 %
Lymphs Abs: 4.4 10*3/uL — ABNORMAL HIGH (ref 0.7–4.0)
MCH: 25.9 pg — ABNORMAL LOW (ref 26.0–34.0)
MCHC: 32.4 g/dL (ref 30.0–36.0)
MCV: 79.9 fL (ref 78.0–100.0)
Monocytes Absolute: 0.6 10*3/uL (ref 0.1–1.0)
Monocytes Relative: 4 %
Neutro Abs: 8.2 10*3/uL — ABNORMAL HIGH (ref 1.7–7.7)
Neutrophils Relative %: 62 %
Platelets: 293 10*3/uL (ref 150–400)
RBC: 4.33 MIL/uL (ref 3.87–5.11)
RDW: 14.7 % (ref 11.5–15.5)
WBC: 13.3 10*3/uL — ABNORMAL HIGH (ref 4.0–10.5)

## 2017-07-03 LAB — PREGNANCY, URINE: Preg Test, Ur: NEGATIVE

## 2017-07-03 SURGERY — DILATATION & CURETTAGE/HYSTEROSCOPY WITH NOVASURE ABLATION
Anesthesia: General

## 2017-07-03 MED ORDER — CHLOROPROCAINE HCL 1 % IJ SOLN
INTRAMUSCULAR | Status: AC
  Filled 2017-07-03: qty 30

## 2017-07-03 MED ORDER — LACTATED RINGERS IV SOLN
INTRAVENOUS | Status: DC
  Administered 2017-07-03: 125 mL/h via INTRAVENOUS

## 2017-07-03 MED ORDER — DEXTROSE 5 % IV SOLN
INTRAVENOUS | Status: AC
  Filled 2017-07-03: qty 3000

## 2017-07-03 MED ORDER — PROPOFOL 10 MG/ML IV BOLUS
INTRAVENOUS | Status: DC | PRN
  Administered 2017-07-03: 300 mg via INTRAVENOUS

## 2017-07-03 MED ORDER — SCOPOLAMINE 1 MG/3DAYS TD PT72
1.0000 | MEDICATED_PATCH | Freq: Once | TRANSDERMAL | Status: DC
  Administered 2017-07-03: 1.5 mg via TRANSDERMAL

## 2017-07-03 MED ORDER — SUCCINYLCHOLINE CHLORIDE 200 MG/10ML IV SOSY
PREFILLED_SYRINGE | INTRAVENOUS | Status: AC
  Filled 2017-07-03: qty 10

## 2017-07-03 MED ORDER — FENTANYL CITRATE (PF) 100 MCG/2ML IJ SOLN
INTRAMUSCULAR | Status: AC
  Filled 2017-07-03: qty 2

## 2017-07-03 MED ORDER — PROMETHAZINE HCL 25 MG/ML IJ SOLN: 6.2500 mg | INTRAMUSCULAR | Status: DC | PRN

## 2017-07-03 MED ORDER — ROCURONIUM BROMIDE 100 MG/10ML IV SOLN
INTRAVENOUS | Status: DC | PRN
  Administered 2017-07-03: 5 mg via INTRAVENOUS

## 2017-07-03 MED ORDER — LIDOCAINE HCL 1 % IJ SOLN
INTRAMUSCULAR | Status: AC
  Filled 2017-07-03: qty 20

## 2017-07-03 MED ORDER — ONDANSETRON HCL 4 MG/2ML IJ SOLN
INTRAMUSCULAR | Status: AC
  Filled 2017-07-03: qty 2

## 2017-07-03 MED ORDER — DEXAMETHASONE SODIUM PHOSPHATE 10 MG/ML IJ SOLN
INTRAMUSCULAR | Status: AC
  Filled 2017-07-03: qty 1

## 2017-07-03 MED ORDER — SUCCINYLCHOLINE CHLORIDE 20 MG/ML IJ SOLN
INTRAMUSCULAR | Status: DC | PRN
  Administered 2017-07-03: 160 mg via INTRAVENOUS

## 2017-07-03 MED ORDER — DEXTROSE 5 % IV SOLN
3.0000 g | INTRAVENOUS | Status: AC
  Administered 2017-07-03: 3 g via INTRAVENOUS
  Filled 2017-07-03: qty 3000

## 2017-07-03 MED ORDER — PHENYLEPHRINE HCL 10 MG/ML IJ SOLN
INTRAMUSCULAR | Status: DC | PRN
  Administered 2017-07-03 (×2): 40 ug via INTRAVENOUS

## 2017-07-03 MED ORDER — FENTANYL CITRATE (PF) 100 MCG/2ML IJ SOLN
INTRAMUSCULAR | Status: DC | PRN
  Administered 2017-07-03 (×2): 50 ug via INTRAVENOUS
  Administered 2017-07-03: 100 ug via INTRAVENOUS

## 2017-07-03 MED ORDER — LIDOCAINE HCL (CARDIAC) 20 MG/ML IV SOLN
INTRAVENOUS | Status: DC | PRN
  Administered 2017-07-03: 100 mg via INTRAVENOUS

## 2017-07-03 MED ORDER — OXYCODONE HCL 5 MG/5ML PO SOLN: 5.0000 mg | Freq: Once | ORAL | Status: DC | PRN

## 2017-07-03 MED ORDER — MIDAZOLAM HCL 2 MG/2ML IJ SOLN
INTRAMUSCULAR | Status: AC
  Filled 2017-07-03: qty 2

## 2017-07-03 MED ORDER — MIDAZOLAM HCL 2 MG/2ML IJ SOLN
INTRAMUSCULAR | Status: DC | PRN
  Administered 2017-07-03 (×2): 1 mg via INTRAVENOUS

## 2017-07-03 MED ORDER — OXYCODONE HCL 5 MG PO TABS: 5.0000 mg | ORAL_TABLET | Freq: Once | ORAL | Status: DC | PRN

## 2017-07-03 MED ORDER — SILVER NITRATE-POT NITRATE 75-25 % EX MISC
CUTANEOUS | Status: AC
  Filled 2017-07-03: qty 1

## 2017-07-03 MED ORDER — LIDOCAINE HCL (CARDIAC) 20 MG/ML IV SOLN
INTRAVENOUS | Status: AC
  Filled 2017-07-03: qty 5

## 2017-07-03 MED ORDER — OXYCODONE-ACETAMINOPHEN 7.5-325 MG PO TABS: 1.0000 | ORAL_TABLET | Freq: Four times a day (QID) | ORAL | 0 refills | Status: DC | PRN

## 2017-07-03 MED ORDER — ONDANSETRON HCL 4 MG/2ML IJ SOLN
INTRAMUSCULAR | Status: DC | PRN
  Administered 2017-07-03: 4 mg via INTRAVENOUS

## 2017-07-03 MED ORDER — CHLOROPROCAINE HCL 1 % IJ SOLN
INTRAMUSCULAR | Status: DC | PRN
  Administered 2017-07-03: 20 mL

## 2017-07-03 MED ORDER — DEXAMETHASONE SODIUM PHOSPHATE 10 MG/ML IJ SOLN
INTRAMUSCULAR | Status: DC | PRN
  Administered 2017-07-03: 10 mg via INTRAVENOUS

## 2017-07-03 MED ORDER — FENTANYL CITRATE (PF) 100 MCG/2ML IJ SOLN: 25.0000 ug | INTRAMUSCULAR | Status: DC | PRN

## 2017-07-03 MED ORDER — MEPERIDINE HCL 25 MG/ML IJ SOLN: 6.2500 mg | INTRAMUSCULAR | Status: DC | PRN

## 2017-07-03 MED ORDER — PROPOFOL 10 MG/ML IV BOLUS
INTRAVENOUS | Status: AC
  Filled 2017-07-03: qty 20

## 2017-07-03 MED ORDER — PROPOFOL 10 MG/ML IV BOLUS
INTRAVENOUS | Status: AC
  Filled 2017-07-03: qty 40

## 2017-07-03 MED ORDER — SCOPOLAMINE 1 MG/3DAYS TD PT72
MEDICATED_PATCH | TRANSDERMAL | Status: AC
  Administered 2017-07-03: 1.5 mg via TRANSDERMAL
  Filled 2017-07-03: qty 1

## 2017-07-03 MED FILL — OXYCODONE/APAP 7.5/325MG: 7.5-325 | 2 days supply | Qty: 10 | Fill #0

## 2017-07-03 SURGICAL SUPPLY — 14 items
ABLATOR ENDOMETRIAL BIPOLAR (ABLATOR) ×2 IMPLANT
CANISTER SUCT 3000ML PPV (MISCELLANEOUS) ×2 IMPLANT
CATH ROBINSON RED A/P 16FR (CATHETERS) ×2 IMPLANT
CLOTH BEACON ORANGE TIMEOUT ST (SAFETY) ×2 IMPLANT
CONTAINER PREFILL 10% NBF 60ML (FORM) IMPLANT
GLOVE BIO SURGEON STRL SZ 6.5 (GLOVE) ×2 IMPLANT
GLOVE BIOGEL PI IND STRL 7.0 (GLOVE) ×3 IMPLANT
GLOVE BIOGEL PI INDICATOR 7.0 (GLOVE) ×3
GOWN STRL REUS W/TWL LRG LVL3 (GOWN DISPOSABLE) ×4 IMPLANT
PACK VAGINAL MINOR WOMEN LF (CUSTOM PROCEDURE TRAY) ×2 IMPLANT
PAD OB MATERNITY 4.3X12.25 (PERSONAL CARE ITEMS) ×2 IMPLANT
TOWEL OR 17X24 6PK STRL BLUE (TOWEL DISPOSABLE) ×4 IMPLANT
TUBING AQUILEX INFLOW (TUBING) ×2 IMPLANT
TUBING AQUILEX OUTFLOW (TUBING) ×2 IMPLANT

## 2017-07-03 NOTE — Transfer of Care (Signed)
Immediate Anesthesia Transfer of Care Note  Patient: Catherine [name]  Procedure(s) Performed: Procedure(s): DILATATION & CURETTAGE/HYSTEROSCOPY WITH NOVASURE ABLATION (N/A)  Patient Location: PACU  Anesthesia Type:General  Level of Consciousness: awake, alert  and oriented  Airway & Oxygen Therapy: Patient Spontanous Breathing and Patient connected to nasal cannula oxygen  Post-op Assessment: Report given to RN, Post -op Vital signs reviewed and stable and Patient moving all extremities  Post vital signs: Reviewed and stable  Last Vitals:  Vitals:   07/03/17 0625  BP: 137/81  Pulse: 91  Resp: 20  Temp: 36.8 C  SpO2: 99%    Last Pain:  Vitals:   07/03/17 0625  TempSrc: Oral      Patients Stated Pain Goal: 5 (07/03/17 0625)  Complications: No apparent anesthesia complications

## 2017-07-03 NOTE — Op Note (Addendum)
Operative Note  07/03/2017  8:15 AM  PATIENT:  Catherine [name]  36 y.o. female  PRE-OPERATIVE DIAGNOSIS:  Refractory menometrorrhagia with severe dysmenorrhea  POST-OPERATIVE DIAGNOSIS: Refractory menometrorrhagia with severe dysmenorrhea    PROCEDURE:  Procedure(s): DILATATION & CURETTAGE/HYSTEROSCOPY WITH NOVASURE ENDOMETRIAL ABLATION  SURGEON:  Surgeon(s): Genia Del, MD  ANESTHESIA:   general  FINDINGS:  Normal intrauterine cavity  DESCRIPTION OF OPERATION:  Under general anesthesia with laryngeal mask the patient is in lithotomy position. She is prepped with Betadine on the suprapubic, vulvar and vaginal areas. The bladder is catheterized. The patient is draped as usual.  Timeout is done.  The vaginal exam reveals an anteverted to intermediate uterus normal volume no adnexal mass. The speculum is inserted in the vagina and the anterior lip of the cervix is grasped with a tenaculum. A paracervical block is done with Nesacaine 1% a total of 20 cc at 4 and 8:00.  The cervical laser is measured at 4.5 cm and the hysterometry is at 10 cm for a cavity length of 5.5 cm. Dilation of the cervix with Hegar dilators up to #25 without difficulty.  The hysteroscope is inserted in the intrauterine cavity and inspection reveals 2 normal ostia and a normal intrauterine cavity. Pictures are taken. The hysteroscope is removed. A curettage is done of the endometrial cavity with a sharp curette on all surfaces. The specimen is sent to pathology.  The NovaSure instrument is inserted in the intrauterine cavity.  The cavity width is measured at 4.4 cm.  The security test is passed.  Endometrial ablation with the NovaSure instrument at a power of 133 W for a duration of 1 minute and 10 seconds.  The NovaSure instrument is removed.  Cauterized material from the endometrium is seen on the NovaSure instrument. The tenaculum is removed from the cervix.  Hemostasis is completed at that level with silver  nitrate. The speculum is removed.  The patient is brought to recovery room in good and stable status.  ESTIMATED BLOOD LOSS: 5 cc  FLUID DEFICIT: <150 CC   Intake/Output Summary (Last 24 hours) at 07/03/17 0815 Last data filed at 07/03/17 0810  Gross per 24 hour  Intake              700 ml  Output               60 ml  Net              640 ml     BLOOD ADMINISTERED:none   LOCAL MEDICATIONS USED:  Nesacaine 1% 20 cc for Paracervical Block  SPECIMEN:  Source of Specimen:  Endometrial curettings  DISPOSITION OF SPECIMEN:  PATHOLOGY  COUNTS:  YES  PLAN OF CARE: Transfer to PACU  Catherine LavoieMD8:15 AM

## 2017-07-03 NOTE — Anesthesia Procedure Notes (Signed)
Procedure Name: Intubation Date/Time: 07/03/2017 7:45 AM Performed by: Hewitt Blade Pre-anesthesia Checklist: Patient identified, Emergency Drugs available, Suction available and Patient being monitored Patient Re-evaluated:Patient Re-evaluated prior to induction Oxygen Delivery Method: Circle system utilized Preoxygenation: Pre-oxygenation with 100% oxygen Induction Type: IV induction Ventilation: Mask ventilation without difficulty Laryngoscope Size: Mac and 3 Grade View: Grade I Tube type: Oral Tube size: 7.0 mm Number of attempts: 1 Airway Equipment and Method: Stylet Placement Confirmation: ETT inserted through vocal cords under direct vision,  positive ETCO2 and breath sounds checked- equal and bilateral Secured at: 21 cm Tube secured with: Tape Dental Injury: Teeth and Oropharynx as per pre-operative assessment

## 2017-07-03 NOTE — H&P (Signed)
Catherine [name] is an 36 y.o. female. G0 Same Sex Partner.  No desire to preserve fertility.  RP: HSC/D+C/Novasure Endometrial Ablation  HPI:  Long standing resistant menometrorrhagia with severe dysmenorrhea.  Not bleeding today.  Pertinent Gynecological History: Menses: flow is excessive with use of many pads or tampons on heaviest days Contraception: Same sex partner Blood transfusions: none Sexually transmitted diseases: no past history Previous GYN Procedures: None  Last mammogram: Never Last pap: normal    Menstrual History:  Patient's last menstrual period was 06/21/2017 (approximate).    Past Medical History:  Diagnosis Date  . Allergy   . Anxiety   . Arthritis   . Asthma    since baby, seasonal  . ASTHMA, UNSPECIFIED 12/10/2006   Qualifier: Diagnosis of  By: Abundio Miu    . CELLULITIS AND ABSCESS OF LEG EXCEPT FOOT 12/30/2007   Qualifier: Diagnosis of  By: Daphine Deutscher FNP, Zena Amos    . Cyst of bone    right side of spine  . DEPENDENT EDEMA, LEGS, BILATERAL 11/22/2008   Qualifier: Diagnosis of  By: Barbaraann Barthel MD, [place]    . Depression 2003Dx  . Edema   . Food allergy    citris  . FOOT PAIN, RIGHT 11/22/2008   Qualifier: Diagnosis of  By: Barbaraann Barthel MD, [place]    . Gastric ulcer   . GERD (gastroesophageal reflux disease)   . Gout   . Hypertension    took Norvasc for 3 months, doctor discontinued no longer on BP medications  . Joint pain   . KNEE INJURY, LEFT 01/01/2009   Qualifier: Diagnosis of  By: Delrae Alfred MD, Lanora Manis    . Migraine   . ONYCHOMYCOSIS, TOENAILS 11/22/2008   Qualifier: Diagnosis of  By: Barbaraann Barthel MD, [place]    . Shortness of breath   . Sleep apnea   . TINEA PEDIS 11/22/2008   Qualifier: Diagnosis of  By: Barbaraann Barthel MD, [place]      Past Surgical History:  Procedure Laterality Date  . UPPER GI ENDOSCOPY    . WISDOM TOOTH EXTRACTION  2008   x4    Family History  Problem Relation Age of Onset  . Hypertension Mother   .  Hyperlipidemia Mother   . Cancer Mother        bone cancer  . Heart disease Mother   . Sudden death Mother   . Stroke Mother   . Thyroid disease Mother   . Sleep apnea Mother   . Obesity Mother   . Sudden death Father   . Hypertension Father   . Heart disease Father   . Hyperlipidemia Father   . Prostate cancer Father   . Lung cancer Father   . Obesity Father   . Alcoholism Father   . Sudden death Brother   . Hypertension Brother   . Hyperlipidemia Brother   . Heart attack Brother   . Heart disease Brother   . Stroke Brother   . Alcoholism Brother   . Drug abuse Brother   . Heart disease Sister   . Hypertension Sister   . Thyroid cancer Sister   . Diabetes Neg Hx   . Colon cancer Neg Hx   . Stomach cancer Neg Hx   . Esophageal cancer Neg Hx   . Rectal cancer Neg Hx   . Liver cancer Neg Hx     Social History:  reports that she quit smoking about 2 years ago. Her smoking use included Cigarettes. She started smoking about 20 years ago.  She has a 9.00 pack-year smoking history. She has never used smokeless tobacco. She reports that she drinks about 0.6 oz of alcohol per week . She reports that she does not use drugs.  Allergies:  Allergies  Allergen Reactions  . Citrus Swelling    Prescriptions Prior to Admission  Medication Sig Dispense Refill Last Dose  . ALL DAY ALLERGY 10 MG tablet TAKE 1 TABLET (10 MG TOTAL) BY MOUTH DAILY. 30 tablet 2 07/02/2017 at Unknown time  . aspirin-acetaminophen-caffeine (EXCEDRIN MIGRAINE) 250-250-65 MG per tablet Take 2 tablets by mouth 2 (two) times daily as needed for migraine. For headache    Past Week at Unknown time  . buPROPion (WELLBUTRIN SR) 200 MG 12 hr tablet Take 1 tablet (200 mg total) by mouth every morning. 30 tablet 0 07/03/2017 at 0530  . diclofenac (CATAFLAM) 50 MG tablet Take 50 mg by mouth 2 (two) times daily at 10 AM and 5 PM.   07/02/2017 at Unknown time  . furosemide (LASIX) 20 MG tablet TAKE 1 TABLET (20 MG TOTAL) BY  MOUTH DAILY. 30 tablet 1 07/02/2017 at Unknown time  . gabapentin (NEURONTIN) 300 MG capsule Take 2 capsules (600 mg total) by mouth 3 (three) times daily. (Patient taking differently: Take 900 mg by mouth 3 (three) times daily. ) 180 capsule 3 07/03/2017 at 0530  . ibuprofen (ADVIL,MOTRIN) 200 MG tablet Take 600-800 mg by mouth every 8 (eight) hours as needed (for pain.).    Past Week at Unknown time  . norgestimate-ethinyl estradiol (ORTHO-CYCLEN,SPRINTEC,PREVIFEM) 0.25-35 MG-MCG tablet Take 1 tablet by mouth daily. (Patient taking differently: Take 1 tablet by mouth daily at 3 pm. ) 1 Package 11 07/02/2017 at Unknown time  . ranitidine (ZANTAC) 150 MG tablet TAKE 1 TABLET BY MOUTH 2 TIMES DAILY. 60 tablet 0 07/03/2017 at 0530  . Vitamin D, Ergocalciferol, (DRISDOL) 50000 units CAPS capsule Take 1 capsule (50,000 Units total) by mouth every 7 (seven) days. 4 capsule 0 Past Week at Unknown time  . budesonide (PULMICORT) 180 MCG/ACT inhaler Inhale 1 puff into the lungs 2 (two) times daily. (Patient taking differently: Inhale 1 puff into the lungs 2 (two) times daily as needed (for shortness of breath). ) 1 Inhaler 1 More than a month at Unknown time  . diclofenac sodium (VOLTAREN) 1 % GEL Apply 2 g topically 4 (four) times daily. (Patient taking differently: Apply 2 g topically 4 (four) times daily as needed (for pain.). ) 100 g 1 More than a month at Unknown time    ROS Neg  Blood pressure 137/81, pulse 91, temperature 98.2 F (36.8 C), temperature source Oral, resp. rate 20, last menstrual period 06/21/2017, SpO2 99 %. Physical Exam  Results for orders placed or performed during the hospital encounter of 07/03/17 (from the past 24 hour(s))  Pregnancy, urine     Status: None   Collection Time: 07/03/17  6:00 AM  Result Value Ref Range   Preg Test, Ur NEGATIVE NEGATIVE  CBC with Differential/Platelet     Status: Abnormal   Collection Time: 07/03/17  6:10 AM  Result Value Ref Range   WBC 13.3 (H)  4.0 - 10.5 K/uL   RBC 4.33 3.87 - 5.11 MIL/uL   Hemoglobin 11.2 (L) 12.0 - 15.0 g/dL   HCT 44.0 (L) 10.2 - 72.5 %   MCV 79.9 78.0 - 100.0 fL   MCH 25.9 (L) 26.0 - 34.0 pg   MCHC 32.4 30.0 - 36.0 g/dL   RDW 36.6 44.0 -  15.5 %   Platelets 293 150 - 400 K/uL   Neutrophils Relative % 62 %   Neutro Abs 8.2 (H) 1.7 - 7.7 K/uL   Lymphocytes Relative 33 %   Lymphs Abs 4.4 (H) 0.7 - 4.0 K/uL   Monocytes Relative 4 %   Monocytes Absolute 0.6 0.1 - 1.0 K/uL   Eosinophils Relative 1 %   Eosinophils Absolute 0.1 0.0 - 0.7 K/uL   Basophils Relative 0 %   Basophils Absolute 0.0 0.0 - 0.1 K/uL    Pelvic US 05/12/2017: Uterus: Measurements: 9.5 x 5.3 x 6.7 cm. Heterogeneous parenchymal pattern. No fibroids. Endometrium: Thickness: 7.1 cm. No focal abnormality visualized. Right ovary: Measurements: 2.9 x 2.1 x 1.8 cm. Normal appearance/no adnexal mass. Left ovary: Measurements: 2.9 x 1.8 x 2.4 cm. Normal appearance/no adnexal mass. No abnormal free fluid. Limited exam due to patient's body habitus.  Assessment/Plan:  36 y.o. G0  1. Menometrorrhagia Long standing Menometrorrhagia resistant to Hormonal treatment with secondary anemia.  Last Hb 05/18/17 at 11.2.  Decision to proceed with HSC/D+C/Novasure Endometrial Ablation.  Same sex partner, certain of not wanting to preserve fertility, never wants to have children.  Surgery/Risks/Postop reviewed.                        Patient was counseled as to the risk of surgery to include the following:  1. Infection (prohylactic antibiotics will be administered)  2. DVT/Pulmonary Embolism (prophylactic pneumo compression stockings will be used)  3. Uterine Perforation with risk of trauma to internal organs requiring additional surgical procedure to repair any injury to     Internal organs requiring perhaps additional hospitalization days.  4.Hemmorhage requiring transfusion and blood products which carry risks such as anaphylactic reaction,  hepatitis and AIDS  Patient had received literature information on the procedure scheduled and all her questions were answered and fully accepts all risk.    Marie-Lyne Lorree Millar 07/03/2017, 6:50 AM

## 2017-07-03 NOTE — Anesthesia Preprocedure Evaluation (Signed)
Anesthesia Evaluation  Patient identified by MRN, date of birth, ID band Patient awake    Reviewed: Allergy & Precautions, NPO status , Patient's Chart, lab work & pertinent test results  Airway Mallampati: III  TM Distance: >3 FB Neck ROM: Full    Dental no notable dental hx. (+) Teeth Intact   Pulmonary shortness of breath, with exertion, at rest and lying, asthma , sleep apnea , former smoker,    Pulmonary exam normal breath sounds clear to auscultation       Cardiovascular hypertension, Pt. on medications Normal cardiovascular exam Rhythm:Regular Rate:Normal     Neuro/Psych  Headaches, PSYCHIATRIC DISORDERS Anxiety Depression Bipolar Disorder  Neuromuscular disease    GI/Hepatic Neg liver ROS, PUD, GERD  Medicated and Controlled,  Endo/Other  Super MO Gout  Renal/GU negative Renal ROS  negative genitourinary   Musculoskeletal  (+) Arthritis , Sacral radiculopathy   Abdominal (+) + obese,   Peds  Hematology negative hematology ROS (+)   Anesthesia Other Findings   Reproductive/Obstetrics Menometorrhagia                             Anesthesia Physical Anesthesia Plan  ASA: III  Anesthesia Plan: General   Post-op Pain Management:    Induction: Intravenous  PONV Risk Score and Plan: 4 or greater and Ondansetron, Dexamethasone, Midazolam, Scopolamine patch - Pre-op, Propofol infusion and Promethazine  Airway Management Planned: Oral ETT  Additional Equipment:   Intra-op Plan:   Post-operative Plan: Extubation in OR  Informed Consent: I have reviewed the patients History and Physical, chart, labs and discussed the procedure including the risks, benefits and alternatives for the proposed anesthesia with the patient or authorized representative who has indicated his/her understanding and acceptance.   Dental advisory given  Plan Discussed with: Anesthesiologist, CRNA and  Surgeon  Anesthesia Plan Comments:         Anesthesia Quick Evaluation

## 2017-07-03 NOTE — Anesthesia Postprocedure Evaluation (Signed)
Anesthesia Post Note  Patient: Catherine [name]  Procedure(s) Performed: Procedure(s) (LRB): DILATATION & CURETTAGE/HYSTEROSCOPY WITH NOVASURE ABLATION (N/A)     Patient location during evaluation: PACU Anesthesia Type: General Level of consciousness: awake and alert Pain management: pain level controlled Vital Signs Assessment: post-procedure vital signs reviewed and stable Respiratory status: spontaneous breathing, nonlabored ventilation and respiratory function stable Cardiovascular status: blood pressure returned to baseline and stable Postop Assessment: no apparent nausea or vomiting Anesthetic complications: no    Last Vitals:  Vitals:   07/03/17 1015 07/03/17 1045  BP: 124/71 116/70  Pulse: 72 75  Resp: 16 16  Temp:  36.6 C  SpO2: 95% 98%    Last Pain:  Vitals:   07/03/17 1045  TempSrc:   PainSc: 0-No pain   Pain Goal: Patients Stated Pain Goal: 5 (07/03/17 1015)               Temperence Zenor A.

## 2017-07-03 NOTE — Discharge Instructions (Addendum)
Endometrial Ablation °Endometrial ablation is a procedure that destroys the thin inner layer of the lining of the uterus (endometrium). This procedure may be done: °· To stop heavy periods. °· To stop bleeding that is causing anemia. °· To control irregular bleeding. °· To treat bleeding caused by small tumors (fibroids) in the endometrium. ° °This procedure is often an alternative to major surgery, such as removal of the uterus and cervix (hysterectomy). As a result of this procedure: °· You may not be able to have children. However, if you are premenopausal (you have not gone through menopause): °? You may still have a small chance of getting pregnant. °? You will need to use a reliable method of birth control after the procedure to prevent pregnancy. °· You may stop having a menstrual period, or you may have only a small amount of bleeding during your period. Menstruation may return several years after the procedure. ° °Tell a health care provider about: °· Any allergies you have. °· All medicines you are taking, including vitamins, herbs, eye drops, creams, and over-the-counter medicines. °· Any problems you or family members have had with the use of anesthetic medicines. °· Any blood disorders you have. °· Any surgeries you have had. °· Any medical conditions you have. °What are the risks? °Generally, this is a safe procedure. However, problems may occur, including: °· A hole (perforation) in the uterus or bowel. °· Infection of the uterus, bladder, or vagina. °· Bleeding. °· Damage to other structures or organs. °· An air bubble in the lung (air embolus). °· Problems with pregnancy after the procedure. °· Failure of the procedure. °· Decreased ability to diagnose cancer in the endometrium. ° °What happens before the procedure? °· You will have tests of your endometrium to make sure there are no pre-cancerous cells or cancer cells present. °· You may have an ultrasound of the uterus. °· You may be given  medicines to thin the endometrium. °· Ask your health care provider about: °? Changing or stopping your regular medicines. This is especially important if you take diabetes medicines or blood thinners. °? Taking medicines such as aspirin and ibuprofen. These medicines can thin your blood. Do not take these medicines before your procedure if your doctor tells you not to. °· Plan to have someone take you home from the hospital or clinic. °What happens during the procedure? °· You will lie on an exam table with your feet and legs supported as in a pelvic exam. °· To lower your risk of infection: °? Your health care team will wash or sanitize their hands and put on germ-free (sterile) gloves. °? Your genital area will be washed with soap. °· An IV tube will be inserted into one of your veins. °· You will be given a medicine to help you relax (sedative). °· A surgical instrument with a light and camera (resectoscope) will be inserted into your vagina and moved into your uterus. This allows your surgeon to see inside your uterus. °· Endometrial tissue will be removed using one of the following methods: °? Radiofrequency. This method uses a radiofrequency-alternating electric current to remove the endometrium. °? Cryotherapy. This method uses extreme cold to freeze the endometrium. °? Heated-free liquid. This method uses a heated saltwater (saline) solution to remove the endometrium. °? Microwave. This method uses high-energy microwaves to heat up the endometrium and remove it. °? Thermal balloon. This method involves inserting a catheter with a balloon tip into the uterus. The balloon tip is   filled with heated fluid to remove the endometrium. °The procedure may vary among health care providers and hospitals. °What happens after the procedure? °· Your blood pressure, heart rate, breathing rate, and blood oxygen level will be monitored until the medicines you were given have worn off. °· As tissue healing occurs, you may  notice vaginal bleeding for 4-6 weeks after the procedure. You may also experience: °? Cramps. °? Thin, watery vaginal discharge that is light pink or brown in color. °? A need to urinate more frequently than usual. °? Nausea. °· Do not drive for 24 hours if you were given a sedative. °· Do not have sex or insert anything into your vagina until your health care provider approves. °Summary °· Endometrial ablation is done to treat the many causes of heavy menstrual bleeding. °· The procedure may be done only after medications have been tried to control the bleeding. °· Plan to have someone take you home from the hospital or clinic. °This information is not intended to replace advice given to you by your health care provider. Make sure you discuss any questions you have with your health care provider. °Document Released: 08/08/2004 Document Revised: 10/16/2016 Document Reviewed: 10/16/2016 °Elsevier Interactive Patient Education © 2017 [employer] ° ° °Post Anesthesia Home Care Instructions ° °Activity: °Get plenty of rest for the remainder of the day. A responsible individual must stay with you for 24 hours following the procedure.  °For the next 24 hours, DO NOT: °-Drive a car °-Operate machinery °-Drink alcoholic beverages °-Take any medication unless instructed by your physician °-Make any legal decisions or sign important papers. ° °Meals: °Start with liquid foods such as gelatin or soup. Progress to regular foods as tolerated. Avoid greasy, spicy, heavy foods. If nausea and/or vomiting occur, drink only clear liquids until the nausea and/or vomiting subsides. Call your physician if vomiting continues. ° °Special Instructions/Symptoms: °Your throat may feel dry or sore from the anesthesia or the breathing tube placed in your throat during surgery. If this causes discomfort, gargle with warm salt water. The discomfort should disappear within 24 hours. ° °If you had a scopolamine patch placed behind your ear for  the management of post- operative nausea and/or vomiting: ° °1. The medication in the patch is effective for 72 hours, after which it should be removed.  Wrap patch in a tissue and discard in the trash. Wash hands thoroughly with soap and water. °2. You may remove the patch earlier than 72 hours if you experience unpleasant side effects which may include dry mouth, dizziness or visual disturbances. °3. Avoid touching the patch. Wash your hands with soap and water after contact with the patch. °  ° ° °

## 2017-07-04 ENCOUNTER — Encounter (HOSPITAL_COMMUNITY): Payer: Self-pay | Admitting: Obstetrics & Gynecology

## 2017-07-08 ENCOUNTER — Other Ambulatory Visit: Payer: Self-pay | Admitting: Internal Medicine

## 2017-07-09 MED FILL — FUROSEMIDE 20 MG TABLET: 20 | 30 days supply | Qty: 30 | Fill #0

## 2017-07-14 ENCOUNTER — Ambulatory Visit (INDEPENDENT_AMBULATORY_CARE_PROVIDER_SITE_OTHER): Payer: 59 | Admitting: Internal Medicine

## 2017-07-14 ENCOUNTER — Encounter: Payer: Self-pay | Admitting: Internal Medicine

## 2017-07-14 VITALS — BP 110/62 | HR 89 | Temp 97.9°F | Ht 67.0 in | Wt >= 6400 oz

## 2017-07-14 DIAGNOSIS — R609 Edema, unspecified: Secondary | ICD-10-CM

## 2017-07-14 MED ORDER — FUROSEMIDE 20 MG PO TABS: 20.0000 mg | ORAL_TABLET | Freq: Two times a day (BID) | ORAL | 1 refills | Status: DC

## 2017-07-14 NOTE — Patient Instructions (Addendum)
Please return in one week for a lab work to check your electrolytes and kidneys with the increase in Lasix. You will need an appointment for these labs.   Take the Lasix 20 mg at 8 am and at 2 pm. I will send in a new prescription so that you don't run out of the medication with taking it twice per day.

## 2017-07-14 NOTE — Assessment & Plan Note (Signed)
Noted that patient's weights have been up-trending. Suspect related to worsening of dependent edema later in the day. Patient reports urinating a significant amount with current dose of Lasix 20 mg. Suspect she could benefit from more frequent dosing of Lasix as edema typically occurring later in the day. Will increase to Lasix 20 mg BID. Recommended taking at 8 am and 2 pm in an attempt to avoid nighttime urination. Will recheck BMET in one week. Potassium noted to be stable at 4.5 earlier this month. No symptoms or exam findings that would be concerning for CHF. Patient has negative echo in 2016. If edema persisted despite increase in diuretic, would consider repeat echo. Has recent lab work with normal TSH and no significant anemia. No history of liver or kidney problems.

## 2017-07-14 NOTE — Progress Notes (Signed)
   Subjective:    Catherine [name] - 36 y.o. female MRN [id number]  Date of birth: 1981-01-11  HPI  Catherine [name] is here for concerns for fluid retention. Patient takes Lasix 20 mg daily for dependent edema. She is followed by weight management every 2 weeks and there has been concern for increasing fluid weight recently. She reports equal edema to the mid shins bilaterally. Edema typically occurs later in the day and worsens with prolonged periods of standing. She has no edema when she wakes up in the morning. She denies SOB and chest pain. Denies PND. Sleeps on 4 pillows but reports that is due to acid reflux symptoms. She is able to lie flat without becoming SOB but she notices worsening of acid reflux symptoms while lying flat. No abdominal swelling.   -  reports that she quit smoking about 2 years ago. Her smoking use included Cigarettes. She started smoking about 20 years ago. She has a 9.00 pack-year smoking history. She has never used smokeless tobacco. - Review of Systems: Per HPI. - Past Medical History: Patient Active Problem List   Diagnosis Date Noted  . Class 3 obesity with serious comorbidity and body mass index (BMI) of 60.0 to 69.9 in adult 06/10/2017  . Fluid retention 06/10/2017  . Menorrhagia 05/18/2017  . Sacral radiculopathy 02/04/2017  . Insulin resistance 02/02/2017  . At risk for violence to self related to depression 02/02/2017  . Morbid obesity (HCC) 01/01/2017  . Sore throat (viral) 11/21/2016  . Vitamin D deficiency 11/12/2016  . Prediabetes 11/12/2016  . Asthma 07/25/2016  . Abdominal pain 06/04/2016  . Otitis, externa, infective 06/04/2016  . Dysuria 03/20/2016  . Gout 11/21/2015  . Obesity, morbid, BMI 50 or higher (HCC) 05/14/2015  . BIPOLAR DISORDER UNSPECIFIED 11/22/2008  . Depression 12/10/2006  . MIGRAINE, UNSPEC., W/O INTRACTABLE MIGRAINE 12/10/2006  . GASTROESOPHAGEAL REFLUX, NO ESOPHAGITIS 12/10/2006  . OSA (obstructive sleep apnea)  12/10/2006   - Medications: reviewed and updated   Objective:   Physical Exam BP 110/62   Pulse 89   Temp 97.9 F (36.6 C) (Oral)   Ht  (1.702 m)   Wt (!) 442 lb 9.6 oz (200.8 kg)   LMP 06/21/2017 (Approximate)   SpO2 96%   BMI 69.32 kg/m  Gen: NAD, alert, cooperative with exam, well-appearing HEENT: NCAT, PERRL, clear conjunctiva, oropharynx clear CV: RRR, good S1/S2, no murmur, 1+ non-pitting edema to mid shins bilaterally, negative homan's sign bilaterally, no TTP over calves  Resp: CTABL, no wheezes, non-labored Psych: good insight, alert and oriented  Assessment & Plan:   Fluid retention Noted that patient's weights have been up-trending. Suspect related to worsening of dependent edema later in the day. Patient reports urinating a significant amount with current dose of Lasix 20 mg. Suspect she could benefit from more frequent dosing of Lasix as edema typically occurring later in the day. Will increase to Lasix 20 mg BID. Recommended taking at 8 am and 2 pm in an attempt to avoid nighttime urination. Will recheck BMET in one week. Potassium noted to be stable at 4.5 earlier this month. No symptoms or exam findings that would be concerning for CHF. Patient has negative echo in 2016. If edema persisted despite increase in diuretic, would consider repeat echo. Has recent lab work with normal TSH and no significant anemia. No history of liver or kidney problems.     Marcy Siren, D.O. 07/14/2017, 10:23 AM PGY-3, [facility]

## 2017-07-16 ENCOUNTER — Ambulatory Visit (INDEPENDENT_AMBULATORY_CARE_PROVIDER_SITE_OTHER): Payer: 59 | Admitting: Physician Assistant

## 2017-07-16 VITALS — BP 106/69 | HR 101 | Temp 97.8°F | Ht 67.0 in | Wt >= 6400 oz

## 2017-07-16 DIAGNOSIS — Z6841 Body Mass Index (BMI) 40.0 and over, adult: Secondary | ICD-10-CM

## 2017-07-16 DIAGNOSIS — Z9189 Other specified personal risk factors, not elsewhere classified: Secondary | ICD-10-CM | POA: Diagnosis not present

## 2017-07-16 DIAGNOSIS — E559 Vitamin D deficiency, unspecified: Secondary | ICD-10-CM | POA: Diagnosis not present

## 2017-07-16 DIAGNOSIS — F3289 Other specified depressive episodes: Secondary | ICD-10-CM

## 2017-07-16 MED ORDER — VITAMIN D (ERGOCALCIFEROL) 1.25 MG (50000 UNIT) PO CAPS: 50000.0000 [IU] | ORAL_CAPSULE | ORAL | 0 refills | Status: DC

## 2017-07-16 MED ORDER — BUPROPION HCL ER (SR) 200 MG PO TB12: 200.0000 mg | ORAL_TABLET | Freq: Every morning | ORAL | 0 refills | Status: DC

## 2017-07-16 MED FILL — BUPROPION HCL SR 200 MG TAB: 200 | 30 days supply | Qty: 30 | Fill #0

## 2017-07-16 NOTE — Progress Notes (Signed)
Office: [phone]  /  Fax: [phone]   HPI:   Chief Complaint: OBESITY Catherine [name] is here to discuss her progress with her obesity treatment plan. She is on the  follow the Category 3 plan and is following her eating plan approximately 0% of the time. She states she is exercising 0 minutes 0 times per week. [name] was retaining fluid on previous visits, she has had her Lasix adjusted and states now the edema has improved. She is following the meal and states her hunger is well controlled. Her weight is (!) 431 lb (195.5 kg) today and has had a weight loss of 4 pounds over a period of 3 weeks since her last visit. She has lost 37 lbs since starting treatment with [name].  Vitamin D deficiency Catherine [name] has a diagnosis of vitamin D deficiency. She is currently taking vit D and denies nausea, vomiting or muscle weakness. Her Vitamin D levels have improved but not yet at goal.   At risk for osteopenia and osteoporosis Catherine [name] is at higher risk of osteopenia and osteoporosis due to vitamin D deficiency.   Depression with emotional eating behaviors Catherine [name] is struggling with emotional eating and using food for comfort to the extent that it is negatively impacting her health. She often snacks when she is not hungry. Catherine [name] sometimes feels she is out of control and then feels guilty that she made poor food choices. She has been working on behavior modification techniques to help reduce her emotional eating and has been very successful. She shows no sign of suicidal or homicidal ideations.  Depression screen Catherine [name] [facility] 2/9 07/14/2017 05/18/2017 03/24/2017 02/04/2017 11/21/2016  Decreased Interest 0 0 0 0 0  Down, Depressed, Hopeless 0 0 0 0 0  PHQ - 2 Score 0 0 0 0 0  Altered sleeping - - - - -  Tired, decreased energy - - - - -  Change in appetite - - - - -  Feeling bad or failure about yourself  - - - - -  Trouble concentrating - - - - -  Moving slowly or fidgety/restless - - - - -  Suicidal thoughts - -  - - -  PHQ-9 Score - - - - -  Some encounter information is confidential and restricted. Go to Review Flowsheets activity to see all data.     ALLERGIES: Allergies  Allergen Reactions  . Citrus Swelling    MEDICATIONS: Current Outpatient Prescriptions on File Prior to Visit  Medication Sig Dispense Refill  . aspirin-acetaminophen-caffeine (EXCEDRIN MIGRAINE) 250-250-65 MG per tablet Take 2 tablets by mouth 2 (two) times daily as needed for migraine. For headache     . budesonide (PULMICORT) 180 MCG/ACT inhaler Inhale 1 puff into the lungs 2 (two) times daily. (Patient taking differently: Inhale 1 puff into the lungs 2 (two) times daily as needed (for shortness of breath). ) 1 Inhaler 1  . cetirizine (ZYRTEC) 10 MG tablet Take 10 mg by mouth daily.    . diclofenac (CATAFLAM) 50 MG tablet Take 50 mg by mouth 2 (two) times daily at 10 AM and 5 PM.    . diclofenac sodium (VOLTAREN) 1 % GEL Apply 2 g topically 4 (four) times daily. (Patient taking differently: Apply 2 g topically 4 (four) times daily as needed (for pain.). ) 100 g 1  . furosemide (LASIX) 20 MG tablet Take 1 tablet (20 mg total) by mouth 2 (two) times daily. 30 tablet 1  . gabapentin (NEURONTIN) 300 MG capsule Take 2  capsules (600 mg total) by mouth 3 (three) times daily. (Patient taking differently: Take 300 mg by mouth 3 (three) times daily. ) 180 capsule 3  . ibuprofen (ADVIL,MOTRIN) 200 MG tablet Take 600-800 mg by mouth every 8 (eight) hours as needed (for pain.).     Marland Kitchen oxyCODONE-acetaminophen (PERCOCET) 7.5-325 MG tablet Take 1 tablet by mouth every 6 (six) hours as needed for severe pain. 10 tablet 0  . ranitidine (ZANTAC) 150 MG tablet TAKE 1 TABLET BY MOUTH 2 TIMES DAILY. 60 tablet 0   No current facility-administered medications on file prior to visit.     PAST MEDICAL HISTORY: Past Medical History:  Diagnosis Date  . Allergy   . Anxiety   . Arthritis   . Asthma    since baby, seasonal  . ASTHMA, UNSPECIFIED  12/10/2006   Qualifier: Diagnosis of  By: Abundio Miu    . CELLULITIS AND ABSCESS OF LEG EXCEPT FOOT 12/30/2007   Qualifier: Diagnosis of  By: Daphine Deutscher FNP, Zena Amos    . Cyst of bone    right side of spine  . DEPENDENT EDEMA, LEGS, BILATERAL 11/22/2008   Qualifier: Diagnosis of  By: Barbaraann Barthel MD, [place]    . Depression 2003Dx  . Edema   . Food allergy    citris  . FOOT PAIN, RIGHT 11/22/2008   Qualifier: Diagnosis of  By: Barbaraann Barthel MD, [place]    . Gastric ulcer   . GERD (gastroesophageal reflux disease)   . Gout   . Hypertension    took Norvasc for 3 months, doctor discontinued no longer on BP medications  . Joint pain   . KNEE INJURY, LEFT 01/01/2009   Qualifier: Diagnosis of  By: Delrae Alfred MD, Lanora Manis    . Migraine   . ONYCHOMYCOSIS, TOENAILS 11/22/2008   Qualifier: Diagnosis of  By: Barbaraann Barthel MD, [place]    . Shortness of breath   . Sleep apnea   . TINEA PEDIS 11/22/2008   Qualifier: Diagnosis of  By: Barbaraann Barthel MD, Benetta Spar      PAST SURGICAL HISTORY: Past Surgical History:  Procedure Laterality Date  . DILITATION & CURRETTAGE/HYSTROSCOPY WITH NOVASURE ABLATION N/A 07/03/2017   Procedure: DILATATION & CURETTAGE/HYSTEROSCOPY WITH NOVASURE ABLATION;  Surgeon: Genia Del, MD;  Location: [facility];  Service: Gynecology;  Laterality: N/A;  . UPPER GI ENDOSCOPY    . WISDOM TOOTH EXTRACTION  2008   x4    SOCIAL HISTORY: Social History  Substance Use Topics  . Smoking status: Former Smoker    Packs/day: 0.50    Years: 18.00    Types: Cigarettes    Start date: 10/13/1996    Quit date: 01/12/2015  . Smokeless tobacco: Never Used  . Alcohol use 0.6 oz/week    1 Standard drinks or equivalent per week     Comment: rarely drinks    FAMILY HISTORY: Family History  Problem Relation Age of Onset  . Hypertension Mother   . Hyperlipidemia Mother   . Cancer Mother        bone cancer  . Heart disease Mother   . Sudden death Mother   . Stroke Mother   . Thyroid disease  Mother   . Sleep apnea Mother   . Obesity Mother   . Sudden death Father   . Hypertension Father   . Heart disease Father   . Hyperlipidemia Father   . Prostate cancer Father   . Lung cancer Father   . Obesity Father   . Alcoholism Father   .  Sudden death Brother   . Hypertension Brother   . Hyperlipidemia Brother   . Heart attack Brother   . Heart disease Brother   . Stroke Brother   . Alcoholism Brother   . Drug abuse Brother   . Heart disease Sister   . Hypertension Sister   . Thyroid cancer Sister   . Diabetes Neg Hx   . Colon cancer Neg Hx   . Stomach cancer Neg Hx   . Esophageal cancer Neg Hx   . Rectal cancer Neg Hx   . Liver cancer Neg Hx     ROS: Review of Systems  Constitutional: Positive for weight loss.  All other systems reviewed and are negative.   PHYSICAL EXAM: Blood pressure 106/69, pulse (!) 101, temperature 97.8 F (36.6 C), height  (1.702 m), weight (!) 431 lb (195.5 kg), last menstrual period 06/21/2017, SpO2 98 %. Body mass index is 67.5 kg/m. Physical Exam  Constitutional: She is oriented to person, place, and time. She appears well-developed and well-nourished.  Cardiovascular:  Tachycardic  Pulmonary/Chest: Effort normal.  Neurological: She is alert and oriented to person, place, and time.  Skin: Skin is warm and dry.  Psychiatric: She has a normal mood and affect.  Vitals reviewed.   RECENT LABS AND TESTS: BMET    Component Value Date/Time   NA 139 06/25/2017 1026   K 4.5 06/25/2017 1026   CL 104 06/25/2017 1026   CO2 21 06/25/2017 1026   GLUCOSE 82 06/25/2017 1026   GLUCOSE 83 06/04/2016 1155   BUN 12 06/25/2017 1026   CREATININE 0.70 06/25/2017 1026   CREATININE 0.90 06/04/2016 1155   CALCIUM 9.0 06/25/2017 1026   GFRNONAA 113 06/25/2017 1026   GFRNONAA 84 06/04/2016 1155   GFRAA 130 06/25/2017 1026   GFRAA >89 06/04/2016 1155   Lab Results  Component Value Date   HGBA1C 5.4 06/25/2017   HGBA1C 5.2 05/19/2017     HGBA1C 5.4 01/20/2017   HGBA1C 5.6 10/27/2016   HGBA1C 5.0 11/21/2015   Lab Results  Component Value Date   INSULIN 30.4 (H) 06/25/2017   INSULIN 29.7 (H) 01/20/2017   INSULIN 30.1 (H) 10/27/2016   CBC    Component Value Date/Time   WBC 13.3 (H) 07/03/2017 0610   RBC 4.33 07/03/2017 0610   HGB 11.2 (L) 07/03/2017 0610   HGB 11.2 05/18/2017 1611   HCT 34.6 (L) 07/03/2017 0610   HCT 35.6 05/18/2017 1611   PLT 293 07/03/2017 0610   PLT 343 05/18/2017 1611   MCV 79.9 07/03/2017 0610   MCV 85 05/18/2017 1611   MCH 25.9 (L) 07/03/2017 0610   MCHC 32.4 07/03/2017 0610   RDW 14.7 07/03/2017 0610   RDW 14.0 05/18/2017 1611   LYMPHSABS 4.4 (H) 07/03/2017 0610   LYMPHSABS 2.9 01/20/2017 0831   MONOABS 0.6 07/03/2017 0610   EOSABS 0.1 07/03/2017 0610   EOSABS 0.1 01/20/2017 0831   BASOSABS 0.0 07/03/2017 0610   BASOSABS 0.0 01/20/2017 0831   Iron/TIBC/Ferritin/ %Sat    Component Value Date/Time   IRON 25 (L) 05/18/2017 1611   TIBC 363 05/18/2017 1611   FERRITIN 16 05/18/2017 1611   IRONPCTSAT 7 (LL) 05/18/2017 1611   Lipid Panel     Component Value Date/Time   CHOL 152 10/27/2016 1058   TRIG 57 10/27/2016 1058   HDL 43 10/27/2016 1058   CHOLHDL 3.6 11/21/2015 1047   VLDL 10 11/21/2015 1047   LDLCALC 98 10/27/2016 1058   Hepatic  Function Panel     Component Value Date/Time   PROT 7.0 06/25/2017 1026   ALBUMIN 3.9 06/25/2017 1026   AST 39 06/25/2017 1026   ALT 57 (H) 06/25/2017 1026   ALKPHOS 61 06/25/2017 1026   BILITOT 0.3 06/25/2017 1026      Component Value Date/Time   TSH 1.43 05/19/2017 1245   TSH 1.460 10/27/2016 1058   TSH 0.96 11/21/2015 1047    ASSESSMENT AND PLAN: Vitamin D deficiency - Plan: Vitamin D, Ergocalciferol, (DRISDOL) 50000 units CAPS capsule  Other depression - with emotional eating - Plan: buPROPion (WELLBUTRIN SR) 200 MG 12 hr tablet  At risk for osteoporosis  Class 3 severe obesity with serious comorbidity and body mass index  (BMI) of 60.0 to 69.9 in adult, unspecified obesity type (HCC)  PLAN: Vitamin D Deficiency Erikka was informed that low vitamin D levels contributes to fatigue and are associated with obesity, breast, and colon cancer. She agrees to continue to take prescription Vit D ,000 IU every week in which a prescription was written today for a 30 day supply and will follow up for routine testing of vitamin D, at least 2-3 times per year. She was informed of the risk of over-replacement of vitamin D and agrees to not increase her dose unless he discusses this with [name] first.  Depression with Emotional Eating Behaviors We discussed behavior modification techniques today to help Kinslee deal with her emotional eating and depression. She has agreed to take Wellbutrin SR 150 mg qd in which a prescription was written today for a 30 day supply and agreed to follow up as directed.   At risk for osteopenia and osteoporosis Madalee is at risk for osteopenia and osteoporsis due to her vitamin D deficiency. She was encouraged to take her vitamin D and follow her higher calcium diet and increase strengthening exercise to help strengthen her bones and decrease her risk of osteopenia and osteoporosis.  Obesity Defne is currently in the action stage of change. As such, her goal is to continue with weight loss efforts She has agreed to follow the Category 3 plan Angelise has been instructed to work up to a goal of 150 minutes of combined cardio and strengthening exercise per week for weight loss and overall health benefits. We discussed the following Behavioral Modification Stratagies today: increasing lean protein intake and keeping healthy foods in the home.  Joel has agreed to follow up with our clinic in 2 weeks. She was informed of the importance of frequent follow up visits to maximize her success with intensive lifestyle modifications for her multiple health conditions.  I, April Moore, am acting as  Energy manager for [employer], PA-C  I have reviewed the above documentation for accuracy and completeness, and I agree with the above. -Illa Level, PA-C  I have reviewed the above note and agree with the plan. -Quillian Quince, MD

## 2017-07-21 ENCOUNTER — Other Ambulatory Visit: Payer: 59

## 2017-07-21 DIAGNOSIS — R609 Edema, unspecified: Secondary | ICD-10-CM | POA: Diagnosis not present

## 2017-07-21 MED FILL — FUROSEMIDE 20 MG TABLET: 20 | 15 days supply | Qty: 30 | Fill #0

## 2017-07-21 MED FILL — VIT D2 1.25 MG (50,000 UNIT: 1.25 MG | 28 days supply | Qty: 4 | Fill #0

## 2017-07-22 ENCOUNTER — Other Ambulatory Visit: Payer: Self-pay | Admitting: Internal Medicine

## 2017-07-22 ENCOUNTER — Telehealth: Payer: Self-pay

## 2017-07-22 DIAGNOSIS — R609 Edema, unspecified: Secondary | ICD-10-CM

## 2017-07-22 LAB — BASIC METABOLIC PANEL
BUN/Creatinine Ratio: 17 (ref 9–23)
BUN: 13 mg/dL (ref 6–20)
CO2: 22 mmol/L (ref 20–29)
Calcium: 8.9 mg/dL (ref 8.7–10.2)
Chloride: 103 mmol/L (ref 96–106)
Creatinine, Ser: 0.76 mg/dL (ref 0.57–1.00)
GFR calc Af Amer: 118 mL/min/{1.73_m2} (ref >59)
GFR calc non Af Amer: 102 mL/min/{1.73_m2} (ref >59)
Glucose: 98 mg/dL (ref 65–99)
Potassium: 4.2 mmol/L (ref 3.5–5.2)
Sodium: 141 mmol/L (ref 134–144)

## 2017-07-22 MED ORDER — FUROSEMIDE 20 MG PO TABS: 20.0000 mg | ORAL_TABLET | Freq: Two times a day (BID) | ORAL | 3 refills | Status: DC

## 2017-07-22 NOTE — Telephone Encounter (Signed)
-----   Message from Arvilla Market, DO sent at 07/22/2017  8:35 AM EDT ----- Please call patient to let her know blood work looks fine with the increase in Lasix.   Marcy Siren, D.O. 07/22/2017, 8:35 AM PGY-3, [facility]

## 2017-07-22 NOTE — Telephone Encounter (Signed)
Spoke to pt. Informed her of the information below. Pt asked for Dr. Earlene Plater to look at her lasix Rx. The Rx instructions are to take 1 pill 2 times a day but the Qty is 30. I spoke with Dr. Earlene Plater. Dr. Earlene Plater will get the Rx corrected. Pt has been informed that the Rx will be changed.  Sunday Spillers, CMA

## 2017-07-27 ENCOUNTER — Encounter: Payer: Self-pay | Admitting: Obstetrics & Gynecology

## 2017-07-27 ENCOUNTER — Ambulatory Visit (INDEPENDENT_AMBULATORY_CARE_PROVIDER_SITE_OTHER): Payer: 59 | Admitting: Obstetrics & Gynecology

## 2017-07-27 DIAGNOSIS — Z09 Encounter for follow-up examination after completed treatment for conditions other than malignant neoplasm: Secondary | ICD-10-CM

## 2017-07-27 DIAGNOSIS — N3090 Cystitis, unspecified without hematuria: Secondary | ICD-10-CM | POA: Diagnosis not present

## 2017-07-27 MED ORDER — SULFAMETHOXAZOLE-TRIMETHOPRIM 800-160 MG PO TABS: 1.0000 | ORAL_TABLET | Freq: Two times a day (BID) | ORAL | 0 refills | Status: AC

## 2017-07-27 MED FILL — SULFAMETHOXAZOLE/TMP DS TAB: 800-160 | 3 days supply | Qty: 6 | Fill #0

## 2017-07-27 NOTE — Progress Notes (Addendum)
    Catherine [name] 21-Jun-1981 [id number]        36 y.o.  G0  RP:  Postop HSC/Resection/D+C 07/03/2017  HPI:  Doing very well postop.  No abdominopelvic pain.  No abnormal d/c, no vaginal bleeding.  No fever.  No UTI Sx, but urine smells like ammonia in am, and then gets better.  Past medical history,surgical history, problem list, medications, allergies, family history and social history were all reviewed and documented in the EPIC chart.  Directed ROS with pertinent positives and negatives documented in the history of present illness/assessment and plan.  Exam:  There were no vitals filed for this visit. General appearance:  Normal  Gyn exam:  Vulva normal.  Bimanual exam: AV Uterus/NT/No mass felt in adnexae.  Patho Benign Polyp. Endometrium, curettage - ENDOMETRIOID-TYPE POLYP. - BENIGN SECRETORY-TYPE ENDOMETRIUM. - BENIGN ENDOCERVICAL-TYPE MUCOSA. - THERE IS NO EVIDENCE OF HYPERPLASIA OR MALIGNANCY  U/A Blood 1+, Nit Positive, many bacteria. U. Culture pending  Assessment/Plan:  36 y.o. G0  1. Follow-up examination after gynecological surgery Good post op evolution.  No complication.  Patho benign, discussed with patient.  Up to date with Annual/Gyn exam with Fam MD.    2. Cystitis U. Culture pending.  Start Bactrim DS BID x 3 days.    Counseling on above >50% x 10 minutes.  Genia Del MD, 8:23 AM 07/27/2017

## 2017-07-27 NOTE — Addendum Note (Signed)
Addended by: Genia Del on: 07/27/2017 08:58 AM   Modules accepted: Orders, Level of Service

## 2017-07-27 NOTE — Addendum Note (Signed)
Addended by: Berna Spare A on: 07/27/2017 09:56 AM   Modules accepted: Orders

## 2017-07-27 NOTE — Patient Instructions (Addendum)
1. Follow-up examination after gynecological surgery Good post op evolution.  No complication.  Patho benign, discussed with patient.  Up to date with Annual/Gyn exam with Fam MD.    2. Cystitis U. Culture pending.  Start Bactrim DS BID x 3 days.    Catherine [name], good to see you today!   Urinary Tract Infection, Adult A urinary tract infection (UTI) is an infection of any part of the urinary tract, which includes the kidneys, ureters, bladder, and urethra. These organs make, store, and get rid of urine in the body. UTI can be a bladder infection (cystitis) or kidney infection (pyelonephritis). What are the causes? This infection may be caused by fungi, viruses, or bacteria. Bacteria are the most common cause of UTIs. This condition can also be caused by repeated incomplete emptying of the bladder during urination. What increases the risk? This condition is more likely to develop if:  You ignore your need to urinate or hold urine for long periods of time.  You do not empty your bladder completely during urination.  You wipe back to front after urinating or having a bowel movement, if you are female.  You are uncircumcised, if you are female.  You are constipated.  You have a urinary catheter that stays in place (indwelling).  You have a weak defense (immune) system.  You have a medical condition that affects your bowels, kidneys, or bladder.  You have diabetes.  You take antibiotic medicines frequently or for long periods of time, and the antibiotics no longer work well against certain types of infections (antibiotic resistance).  You take medicines that irritate your urinary tract.  You are exposed to chemicals that irritate your urinary tract.  You are female.  What are the signs or symptoms? Symptoms of this condition include:  Fever.  Frequent urination or passing small amounts of urine frequently.  Needing to urinate urgently.  Pain or burning with urination.  Urine  that smells bad or unusual.  Cloudy urine.  Pain in the lower abdomen or back.  Trouble urinating.  Blood in the urine.  Vomiting or being less hungry than normal.  Diarrhea or abdominal pain.  Vaginal discharge, if you are female.  How is this diagnosed? This condition is diagnosed with a medical history and physical exam. You will also need to provide a urine sample to test your urine. Other tests may be done, including:  Blood tests.  Sexually transmitted disease (STD) testing.  If you have had more than one UTI, a cystoscopy or imaging studies may be done to determine the cause of the infections. How is this treated? Treatment for this condition often includes a combination of two or more of the following:  Antibiotic medicine.  Other medicines to treat less common causes of UTI.  Over-the-counter medicines to treat pain.  Drinking enough water to stay hydrated.  Follow these instructions at home:  Take over-the-counter and prescription medicines only as told by your health care provider.  If you were prescribed an antibiotic, take it as told by your health care provider. Do not stop taking the antibiotic even if you start to feel better.  Avoid alcohol, caffeine, tea, and carbonated beverages. They can irritate your bladder.  Drink enough fluid to keep your urine clear or pale yellow.  Keep all follow-up visits as told by your health care provider. This is important.  Make sure to: ? Empty your bladder often and completely. Do not hold urine for long periods of time. ?  Empty your bladder before and after sex. ? Wipe from front to back after a bowel movement if you are female. Use each tissue one time when you wipe. Contact a health care provider if:  You have back pain.  You have a fever.  You feel nauseous or vomit.  Your symptoms do not get better after 3 days.  Your symptoms go away and then return. Get help right away if:  You have severe back  pain or lower abdominal pain.  You are vomiting and cannot keep down any medicines or water. This information is not intended to replace advice given to you by your health care provider. Make sure you discuss any questions you have with your health care provider. Document Released: 07/09/2005 Document Revised: 03/12/2016 Document Reviewed: 08/20/2015 Elsevier Interactive Patient Education  2017 [employer].

## 2017-07-28 LAB — URINALYSIS W MICROSCOPIC + REFLEX CULTURE
Bilirubin Urine: NEGATIVE
Glucose, UA: NEGATIVE
Hyaline Cast: NONE SEEN /LPF
Ketones, ur: NEGATIVE
Leukocyte Esterase: NEGATIVE
Nitrites, Initial: POSITIVE — AB
Protein, ur: NEGATIVE
Specific Gravity, Urine: 1.02 (ref 1.001–1.03)
pH: 5.5 (ref 5.0–8.0)

## 2017-07-28 LAB — NO CULTURE INDICATED

## 2017-07-29 ENCOUNTER — Ambulatory Visit (INDEPENDENT_AMBULATORY_CARE_PROVIDER_SITE_OTHER): Payer: 59 | Admitting: Physician Assistant

## 2017-07-29 VITALS — BP 99/67 | HR 101 | Temp 97.9°F | Ht 67.0 in | Wt >= 6400 oz

## 2017-07-29 DIAGNOSIS — E559 Vitamin D deficiency, unspecified: Secondary | ICD-10-CM | POA: Diagnosis not present

## 2017-07-29 DIAGNOSIS — Z6841 Body Mass Index (BMI) 40.0 and over, adult: Secondary | ICD-10-CM | POA: Diagnosis not present

## 2017-07-29 DIAGNOSIS — Z9189 Other specified personal risk factors, not elsewhere classified: Secondary | ICD-10-CM

## 2017-07-29 DIAGNOSIS — E8881 Metabolic syndrome: Secondary | ICD-10-CM | POA: Diagnosis not present

## 2017-07-29 MED ORDER — METFORMIN HCL 500 MG PO TABS: 500.0000 mg | ORAL_TABLET | Freq: Every day | ORAL | 0 refills | Status: DC

## 2017-07-29 MED FILL — metFORMIN HCL 500 MG TABS: 500 | 30 days supply | Qty: 30 | Fill #0

## 2017-07-29 NOTE — Progress Notes (Signed)
Office: [phone]  /  Fax: [phone]   HPI:   Chief Complaint: OBESITY Catherine [name] is here to discuss her progress with her obesity treatment plan. She is on the Category 3 plan and is following her eating plan approximately 85 % of the time. She states she is walking for 20 minutes 2 times per week. Catherine [name] has noticed increase in hunger over the past few weeks. She has been keeping a food journal and has not been keeping up with her protein intake. Her weight is (!) 436 lb (197.8 kg) today and has had a weight gain of 5 pounds over a period of 2 weeks since her last visit. She has lost 32 lbs since starting treatment with [name].  Insulin Resistance Catherine [name] has a diagnosis of insulin resistance based on her elevated fasting insulin level >5. Although Catherine [name]'s blood glucose readings are still under good control, insulin resistance puts her at greater risk of metabolic syndrome and diabetes. She noticed an increase in hunger. She is not taking metformin currently and continues to work on diet and exercise to decrease risk of diabetes.  At risk for diabetes [name] is at higher than average risk for developing diabetes due to her obesity and insulin resistance. She currently denies polyuria or polydipsia.  Vitamin D deficiency Catherine [name] has a diagnosis of vitamin D deficiency. She is currently taking vit D and denies nausea, vomiting or muscle weakness.   ALLERGIES: Allergies  Allergen Reactions  . Citrus Swelling    MEDICATIONS: Current Outpatient Prescriptions on File Prior to Visit  Medication Sig Dispense Refill  . aspirin-acetaminophen-caffeine (EXCEDRIN MIGRAINE) 250-250-65 MG per tablet Take 2 tablets by mouth 2 (two) times daily as needed for migraine. For headache     . budesonide (PULMICORT) 180 MCG/ACT inhaler Inhale 1 puff into the lungs 2 (two) times daily. (Patient taking differently: Inhale 1 puff into the lungs 2 (two) times daily as needed (for shortness of breath). ) 1  Inhaler 1  . buPROPion (WELLBUTRIN SR) 200 MG 12 hr tablet Take 1 tablet (200 mg total) by mouth every morning. 30 tablet 0  . cetirizine (ZYRTEC) 10 MG tablet Take 10 mg by mouth daily.    . diclofenac (CATAFLAM) 50 MG tablet Take 50 mg by mouth 2 (two) times daily at 10 AM and 5 PM.    . diclofenac sodium (VOLTAREN) 1 % GEL Apply 2 g topically 4 (four) times daily. (Patient taking differently: Apply 2 g topically 4 (four) times daily as needed (for pain.). ) 100 g 1  . furosemide (LASIX) 20 MG tablet Take 1 tablet (20 mg total) by mouth 2 (two) times daily. 60 tablet 3  . gabapentin (NEURONTIN) 300 MG capsule Take 2 capsules (600 mg total) by mouth 3 (three) times daily. (Patient taking differently: Take 300 mg by mouth 3 (three) times daily. ) 180 capsule 3  . ibuprofen (ADVIL,MOTRIN) 200 MG tablet Take 600-800 mg by mouth every 8 (eight) hours as needed (for pain.).     Marland Kitchen ranitidine (ZANTAC) 150 MG tablet TAKE 1 TABLET BY MOUTH 2 TIMES DAILY. 60 tablet 0  . sulfamethoxazole-trimethoprim (BACTRIM DS,SEPTRA DS) 800-160 MG tablet Take 1 tablet by mouth 2 (two) times daily. 6 tablet 0  . Vitamin D, Ergocalciferol, (DRISDOL) 50000 units CAPS capsule Take 1 capsule (50,000 Units total) by mouth every 7 (seven) days. 4 capsule 0   No current facility-administered medications on file prior to visit.     PAST MEDICAL HISTORY: Past Medical  History:  Diagnosis Date  . Allergy   . Anxiety   . Arthritis   . Asthma    since baby, seasonal  . ASTHMA, UNSPECIFIED 12/10/2006   Qualifier: Diagnosis of  By: Abundio Miu    . CELLULITIS AND ABSCESS OF LEG EXCEPT FOOT 12/30/2007   Qualifier: Diagnosis of  By: Daphine Deutscher FNP, Zena Amos    . Cyst of bone    right side of spine  . DEPENDENT EDEMA, LEGS, BILATERAL 11/22/2008   Qualifier: Diagnosis of  By: Barbaraann Barthel MD, [place]    . Depression 2003Dx  . Edema   . Food allergy    citris  . FOOT PAIN, RIGHT 11/22/2008   Qualifier: Diagnosis of  By: Barbaraann Barthel  MD, [place]    . Gastric ulcer   . GERD (gastroesophageal reflux disease)   . Gout   . Hypertension    took Norvasc for 3 months, doctor discontinued no longer on BP medications  . Joint pain   . KNEE INJURY, LEFT 01/01/2009   Qualifier: Diagnosis of  By: Delrae Alfred MD, Lanora Manis    . Migraine   . ONYCHOMYCOSIS, TOENAILS 11/22/2008   Qualifier: Diagnosis of  By: Barbaraann Barthel MD, [place]    . Shortness of breath   . Sleep apnea   . TINEA PEDIS 11/22/2008   Qualifier: Diagnosis of  By: Barbaraann Barthel MD, Benetta Spar      PAST SURGICAL HISTORY: Past Surgical History:  Procedure Laterality Date  . DILITATION & CURRETTAGE/HYSTROSCOPY WITH NOVASURE ABLATION N/A 07/03/2017   Procedure: DILATATION & CURETTAGE/HYSTEROSCOPY WITH NOVASURE ABLATION;  Surgeon: Genia Del, MD;  Location: [facility];  Service: Gynecology;  Laterality: N/A;  . UPPER GI ENDOSCOPY    . WISDOM TOOTH EXTRACTION  2008   x4    SOCIAL HISTORY: Social History  Substance Use Topics  . Smoking status: Former Smoker    Packs/day: 0.50    Years: 18.00    Types: Cigarettes    Start date: 10/13/1996    Quit date: 01/12/2015  . Smokeless tobacco: Never Used  . Alcohol use 0.6 oz/week    1 Standard drinks or equivalent per week     Comment: rarely drinks    FAMILY HISTORY: Family History  Problem Relation Age of Onset  . Hypertension Mother   . Hyperlipidemia Mother   . Cancer Mother        bone cancer  . Heart disease Mother   . Sudden death Mother   . Stroke Mother   . Thyroid disease Mother   . Sleep apnea Mother   . Obesity Mother   . Sudden death Father   . Hypertension Father   . Heart disease Father   . Hyperlipidemia Father   . Prostate cancer Father   . Lung cancer Father   . Obesity Father   . Alcoholism Father   . Sudden death Brother   . Hypertension Brother   . Hyperlipidemia Brother   . Heart attack Brother   . Heart disease Brother   . Stroke Brother   . Alcoholism Brother   . Drug abuse Brother     . Heart disease Sister   . Hypertension Sister   . Thyroid cancer Sister   . Diabetes Neg Hx   . Colon cancer Neg Hx   . Stomach cancer Neg Hx   . Esophageal cancer Neg Hx   . Rectal cancer Neg Hx   . Liver cancer Neg Hx     ROS: Review of Systems  Constitutional: Negative  for weight loss.  Gastrointestinal: Negative for nausea and vomiting.  Genitourinary: Negative for frequency.  Musculoskeletal:       Negative muscle weakness  Endo/Heme/Allergies: Negative for polydipsia.       Positive polyphagia    PHYSICAL EXAM: Blood pressure 99/67, pulse (!) 101, temperature 97.9 F (36.6 C), temperature source Oral, height 5\' 7"  (1.702 m), weight (!) 436 lb (197.8 kg), SpO2 99 %. Body mass index is 68.29 kg/m. Physical Exam  Constitutional: She is oriented to person, place, and time. She appears well-developed and well-nourished.  Cardiovascular:  tachycardic  Pulmonary/Chest: Effort normal.  Musculoskeletal: Normal range of motion.  Neurological: She is oriented to person, place, and time.  Skin: Skin is warm and dry.  Psychiatric: She has a normal mood and affect. Her behavior is normal.  Vitals reviewed.   RECENT LABS AND TESTS: BMET    Component Value Date/Time   NA 141 07/21/2017 0925   K 4.2 07/21/2017 0925   CL 103 07/21/2017 0925   CO2 22 07/21/2017 0925   GLUCOSE 98 07/21/2017 0925   GLUCOSE 83 06/04/2016 1155   BUN 13 07/21/2017 0925   CREATININE 0.76 07/21/2017 0925   CREATININE 0.90 06/04/2016 1155   CALCIUM 8.9 07/21/2017 0925   GFRNONAA 102 07/21/2017 0925   GFRNONAA 84 06/04/2016 1155   GFRAA 118 07/21/2017 0925   GFRAA >89 06/04/2016 1155   Lab Results  Component Value Date   HGBA1C 5.4 06/25/2017   HGBA1C 5.2 05/19/2017   HGBA1C 5.4 01/20/2017   HGBA1C 5.6 10/27/2016   HGBA1C 5.0 11/21/2015   Lab Results  Component Value Date   INSULIN 30.4 (H) 06/25/2017   INSULIN 29.7 (H) 01/20/2017   INSULIN 30.1 (H) 10/27/2016   CBC    Component  Value Date/Time   WBC 13.3 (H) 07/03/2017 0610   RBC 4.33 07/03/2017 0610   HGB 11.2 (L) 07/03/2017 0610   HGB 11.2 05/18/2017 1611   HCT 34.6 (L) 07/03/2017 0610   HCT 35.6 05/18/2017 1611   PLT 293 07/03/2017 0610   PLT 343 05/18/2017 1611   MCV 79.9 07/03/2017 0610   MCV 85 05/18/2017 1611   MCH 25.9 (L) 07/03/2017 0610   MCHC 32.4 07/03/2017 0610   RDW 14.7 07/03/2017 0610   RDW 14.0 05/18/2017 1611   LYMPHSABS 4.4 (H) 07/03/2017 0610   LYMPHSABS 2.9 01/20/2017 0831   MONOABS 0.6 07/03/2017 0610   EOSABS 0.1 07/03/2017 0610   EOSABS 0.1 01/20/2017 0831   BASOSABS 0.0 07/03/2017 0610   BASOSABS 0.0 01/20/2017 0831   Iron/TIBC/Ferritin/ %Sat    Component Value Date/Time   IRON 25 (L) 05/18/2017 1611   TIBC 363 05/18/2017 1611   FERRITIN 16 05/18/2017 1611   IRONPCTSAT 7 (LL) 05/18/2017 1611   Lipid Panel     Component Value Date/Time   CHOL 152 10/27/2016 1058   TRIG 57 10/27/2016 1058   HDL 43 10/27/2016 1058   CHOLHDL 3.6 11/21/2015 1047   VLDL 10 11/21/2015 1047   LDLCALC 98 10/27/2016 1058   Hepatic Function Panel     Component Value Date/Time   PROT 7.0 06/25/2017 1026   ALBUMIN 3.9 06/25/2017 1026   AST 39 06/25/2017 1026   ALT 57 (H) 06/25/2017 1026   ALKPHOS 61 06/25/2017 1026   BILITOT 0.3 06/25/2017 1026      Component Value Date/Time   TSH 1.43 05/19/2017 1245   TSH 1.460 10/27/2016 1058   TSH 0.96 11/21/2015 1047  ASSESSMENT AND PLAN: Insulin resistance - Plan: metFORMIN (GLUCOPHAGE) 500 MG tablet  Vitamin D deficiency  At risk for diabetes mellitus  Class 3 severe obesity with serious comorbidity and body mass index (BMI) of 60.0 to 69.9 in adult, unspecified obesity type (HCC)  PLAN:  Insulin Resistance Madiha will continue to work on weight loss, exercise, and decreasing simple carbohydrates in her diet to help decrease the risk of diabetes. We dicussed metformin including benefits and risks. She was informed that eating too  many simple carbohydrates or too many calories at one sitting increases the likelihood of GI side effects. Syna agrees to start metformin for now and prescription was written today for metformin 500 mg qam #30 with no refills. Kosha agreed to follow up with [name] as directed to monitor her progress.  Diabetes risk counseling Zani was given extended (15 minutes) diabetes prevention counseling today. She is 36 y.o. female and has risk factors for diabetes including obesity and insulin resistance. We discussed intensive lifestyle modifications today with an emphasis on weight loss as well as increasing exercise and decreasing simple carbohydrates in her diet.  Vitamin D Deficiency Alahna was informed that low vitamin D levels contributes to fatigue and are associated with obesity, breast, and colon cancer. She agrees to continue to take prescription Vit D @50 ,000 IU every week and will follow up for routine testing of vitamin D, at least 2-3 times per year. She was informed of the risk of over-replacement of vitamin D and agrees to not increase her dose unless he discusses this with [name] first.  Obesity Alaiya is currently in the action stage of change. As such, her goal is to continue with weight loss efforts She has agreed to keep a food journal with 1200 to 1500 calories and 95 grams of protein daily Reylynn has been instructed to work up to a goal of 150 minutes of combined cardio and strengthening exercise per week for weight loss and overall health benefits. We discussed the following Behavioral Modification Strategies today: increasing lean protein intake, celebration eating strategies and planning for success  Madalin has agreed to follow up with our clinic in 2 weeks. She was informed of the importance of frequent follow up visits to maximize her success with intensive lifestyle modifications for her multiple health conditions.  I, [place] Crane, am acting as transcriptionist for Illa Level, PA-C  I have reviewed the above documentation for accuracy and completeness, and I agree with the above. -Illa Level, PA-C  I have reviewed the above note and agree with the plan. -Quillian Quince, MD   OBESITY BEHAVIORAL INTERVENTION VISIT  Today's visit was # 18 out of 22.  Starting weight: 468 lbs Starting date: 10/27/16 Today's weight : 436 lbs Today's date: 07/29/2017 Total lbs lost to date: 33 (Patients must lose 7 lbs in the first 6 months to continue with counseling)   ASK: We discussed the diagnosis of obesity with [name] today and Emerie agreed to give [name] permission to discuss obesity behavioral modification therapy today.  ASSESS: Annaleigh has the diagnosis of obesity and her BMI today is 68.27 Inda is in the action stage of change   ADVISE: Deari was educated on the multiple health risks of obesity as well as the benefit of weight loss to improve her health. She was advised of the need for long term treatment and the importance of lifestyle modifications.  AGREE: Multiple dietary modification options and treatment options were discussed and  Treanna agreed  to keep a food journal with 1200 to 1500 calories and 95 grams of protein daily We discussed the following Behavioral Modification Strategies today: increasing lean protein intake, celebration eating strategies and planning for success

## 2017-07-31 ENCOUNTER — Telehealth: Payer: Self-pay | Admitting: Internal Medicine

## 2017-07-31 NOTE — Telephone Encounter (Signed)
FMLA extension form dropped off for at front desk for completion.  Verified that patient section of form has been completed.  Last DOS/WCC with PCP was 07/14/17.  Placed form in white team folder to be completed by clinical staff.  Lina Sarheryl A Stanley

## 2017-08-03 NOTE — Telephone Encounter (Signed)
Clinical info completed on FML extension form.  Place form in Dr. Philis PiqueWallace's box for completion.  Sunday SpillersSharon T Wilbert Schouten, CMA

## 2017-08-04 ENCOUNTER — Ambulatory Visit: Payer: 59 | Admitting: Family Medicine

## 2017-08-06 NOTE — Telephone Encounter (Signed)
Patient left message on nurse line checking status for FMLA paperwork.

## 2017-08-10 MED FILL — FUROSEMIDE 20 MG TABLET: 20 | 30 days supply | Qty: 60 | Fill #0

## 2017-08-10 NOTE — Telephone Encounter (Signed)
FMLA paperwork completed and left with April on Thursday, Oct 25.   [name], D.O. 08/10/2017, 9:15 AM PGY-3, [facility]

## 2017-08-12 ENCOUNTER — Ambulatory Visit (INDEPENDENT_AMBULATORY_CARE_PROVIDER_SITE_OTHER): Payer: 59 | Admitting: Physician Assistant

## 2017-08-12 VITALS — BP 92/64 | HR 102 | Temp 98.4°F | Ht 67.0 in | Wt >= 6400 oz

## 2017-08-12 DIAGNOSIS — E559 Vitamin D deficiency, unspecified: Secondary | ICD-10-CM | POA: Diagnosis not present

## 2017-08-12 DIAGNOSIS — Z9189 Other specified personal risk factors, not elsewhere classified: Secondary | ICD-10-CM | POA: Diagnosis not present

## 2017-08-12 DIAGNOSIS — F3289 Other specified depressive episodes: Secondary | ICD-10-CM | POA: Diagnosis not present

## 2017-08-12 DIAGNOSIS — Z6841 Body Mass Index (BMI) 40.0 and over, adult: Secondary | ICD-10-CM | POA: Diagnosis not present

## 2017-08-12 DIAGNOSIS — E8881 Metabolic syndrome: Secondary | ICD-10-CM | POA: Diagnosis not present

## 2017-08-12 MED ORDER — BUPROPION HCL ER (SR) 200 MG PO TB12: 200.0000 mg | ORAL_TABLET | Freq: Every morning | ORAL | 0 refills | Status: DC

## 2017-08-12 MED ORDER — METFORMIN HCL 500 MG PO TABS: 500.0000 mg | ORAL_TABLET | Freq: Two times a day (BID) | ORAL | 0 refills | Status: DC

## 2017-08-12 MED ORDER — VITAMIN D (ERGOCALCIFEROL) 1.25 MG (50000 UNIT) PO CAPS: 50000.0000 [IU] | ORAL_CAPSULE | ORAL | 0 refills | Status: DC

## 2017-08-12 MED FILL — BUPROPION HCL SR 200 MG TAB: 200 | 30 days supply | Qty: 30 | Fill #0

## 2017-08-12 MED FILL — metFORMIN HCL 500 MG TABS: 500 | 30 days supply | Qty: 60 | Fill #0

## 2017-08-12 MED FILL — VIT D2 1.25 MG (50,000 UNIT: 1.25 MG | 28 days supply | Qty: 4 | Fill #0

## 2017-08-12 NOTE — Progress Notes (Signed)
Office: [phone]  /  Fax: [phone]   HPI:   Chief Complaint: OBESITY Catherine [name] is here to discuss her progress with her obesity treatment plan. She is on the Category 3 plan and is following her eating plan approximately 85 % of the time. She states she is exercising 0 minutes 0 times per week. Catherine [name] continues to do well with weight loss. She plans her meals well and states her hunger is well controlled. She would like more variety with her meals. Her weight is (!) 431 lb (195.5 kg) today and has had a weight loss of 5 pounds over a period of 2 weeks since her last visit. She has lost 37 lbs since starting treatment with [name].  Vitamin D deficiency Catherine [name] has a diagnosis of vitamin D deficiency. She is currently taking vit D and denies nausea, vomiting or muscle weakness.  Insulin Resistance Catherine [name] has a diagnosis of insulin resistance based on her elevated fasting insulin level >5. Although Catherine [name]'s blood glucose readings are still under good control, insulin resistance puts her at greater risk of metabolic syndrome and diabetes. She is taking metformin currently and denies polyphagia. She continues to work on diet and exercise to decrease risk of diabetes.  Depression with emotional eating behaviors Catherine [name] is struggling with emotional eating and using food for comfort to the extent that it is negatively impacting her health. She often snacks when she is not hungry. Catherine [name] sometimes feels she is out of control and then feels guilty that she made poor food choices. She has been working on behavior modification techniques to help reduce her emotional eating and has been somewhat successful. Her mood is stable and she shows no sign of suicidal or homicidal ideations.  Depression screen Catherine [name] 2/9 07/14/2017 05/18/2017 03/24/2017 02/04/2017 11/21/2016  Decreased Interest 0 0 0 0 0  Down, Depressed, Hopeless 0 0 0 0 0  PHQ - 2 Score 0 0 0 0 0  Altered sleeping - - - - -  Tired, decreased  energy - - - - -  Change in appetite - - - - -  Feeling bad or failure about yourself  - - - - -  Trouble concentrating - - - - -  Moving slowly or fidgety/restless - - - - -  Suicidal thoughts - - - - -  PHQ-9 Score - - - - -  Some encounter information is confidential and restricted. Go to Review Flowsheets activity to see all data.      ALLERGIES: Allergies  Allergen Reactions  . Citrus Swelling    MEDICATIONS: Current Outpatient Prescriptions on File Prior to Visit  Medication Sig Dispense Refill  . aspirin-acetaminophen-caffeine (EXCEDRIN MIGRAINE) 250-250-65 MG per tablet Take 2 tablets by mouth 2 (two) times daily as needed for migraine. For headache     . budesonide (PULMICORT) 180 MCG/ACT inhaler Inhale 1 puff into the lungs 2 (two) times daily. (Patient taking differently: Inhale 1 puff into the lungs 2 (two) times daily as needed (for shortness of breath). ) 1 Inhaler 1  . buPROPion (WELLBUTRIN SR) 200 MG 12 hr tablet Take 1 tablet (200 mg total) by mouth every morning. 30 tablet 0  . cetirizine (ZYRTEC) 10 MG tablet Take 10 mg by mouth daily.    . diclofenac (CATAFLAM) 50 MG tablet Take 50 mg by mouth 2 (two) times daily at 10 AM and 5 PM.    . diclofenac sodium (VOLTAREN) 1 % GEL Apply 2 g topically 4 (four) times daily. (  Patient taking differently: Apply 2 g topically 4 (four) times daily as needed (for pain.). ) 100 g 1  . furosemide (LASIX) 20 MG tablet Take 1 tablet (20 mg total) by mouth 2 (two) times daily. 60 tablet 3  . gabapentin (NEURONTIN) 300 MG capsule Take 2 capsules (600 mg total) by mouth 3 (three) times daily. (Patient taking differently: Take 300 mg by mouth 3 (three) times daily. ) 180 capsule 3  . ibuprofen (ADVIL,MOTRIN) 200 MG tablet Take 600-800 mg by mouth every 8 (eight) hours as needed (for pain.).     Marland Kitchen ranitidine (ZANTAC) 150 MG tablet TAKE 1 TABLET BY MOUTH 2 TIMES DAILY. 60 tablet 0  . Vitamin D, Ergocalciferol, (DRISDOL) 50000 units CAPS  capsule Take 1 capsule (50,000 Units total) by mouth every 7 (seven) days. 4 capsule 0   No current facility-administered medications on file prior to visit.     PAST MEDICAL HISTORY: Past Medical History:  Diagnosis Date  . Allergy   . Anxiety   . Arthritis   . Asthma    since baby, seasonal  . ASTHMA, UNSPECIFIED 12/10/2006   Qualifier: Diagnosis of  By: Abundio Miu    . CELLULITIS AND ABSCESS OF LEG EXCEPT FOOT 12/30/2007   Qualifier: Diagnosis of  By: Daphine Deutscher FNP, Zena Amos    . Cyst of bone    right side of spine  . DEPENDENT EDEMA, LEGS, BILATERAL 11/22/2008   Qualifier: Diagnosis of  By: Barbaraann Barthel MD, [place]    . Depression 2003Dx  . Edema   . Food allergy    citris  . FOOT PAIN, RIGHT 11/22/2008   Qualifier: Diagnosis of  By: Barbaraann Barthel MD, [place]    . Gastric ulcer   . GERD (gastroesophageal reflux disease)   . Gout   . Hypertension    took Norvasc for 3 months, doctor discontinued no longer on BP medications  . Joint pain   . KNEE INJURY, LEFT 01/01/2009   Qualifier: Diagnosis of  By: Delrae Alfred MD, Lanora Manis    . Migraine   . ONYCHOMYCOSIS, TOENAILS 11/22/2008   Qualifier: Diagnosis of  By: Barbaraann Barthel MD, [place]    . Shortness of breath   . Sleep apnea   . TINEA PEDIS 11/22/2008   Qualifier: Diagnosis of  By: Barbaraann Barthel MD, Benetta Spar      PAST SURGICAL HISTORY: Past Surgical History:  Procedure Laterality Date  . DILITATION & CURRETTAGE/HYSTROSCOPY WITH NOVASURE ABLATION N/A 07/03/2017   Procedure: DILATATION & CURETTAGE/HYSTEROSCOPY WITH NOVASURE ABLATION;  Surgeon: Genia Del, MD;  Location: [facility];  Service: Gynecology;  Laterality: N/A;  . UPPER GI ENDOSCOPY    . WISDOM TOOTH EXTRACTION  2008   x4    SOCIAL HISTORY: Social History  Substance Use Topics  . Smoking status: Former Smoker    Packs/day: 0.50    Years: 18.00    Types: Cigarettes    Start date: 10/13/1996    Quit date: 01/12/2015  . Smokeless tobacco: Never Used  . Alcohol use 0.6  oz/week    1 Standard drinks or equivalent per week     Comment: rarely drinks    FAMILY HISTORY: Family History  Problem Relation Age of Onset  . Hypertension Mother   . Hyperlipidemia Mother   . Cancer Mother        bone cancer  . Heart disease Mother   . Sudden death Mother   . Stroke Mother   . Thyroid disease Mother   . Sleep apnea Mother   .  Obesity Mother   . Sudden death Father   . Hypertension Father   . Heart disease Father   . Hyperlipidemia Father   . Prostate cancer Father   . Lung cancer Father   . Obesity Father   . Alcoholism Father   . Sudden death Brother   . Hypertension Brother   . Hyperlipidemia Brother   . Heart attack Brother   . Heart disease Brother   . Stroke Brother   . Alcoholism Brother   . Drug abuse Brother   . Heart disease Sister   . Hypertension Sister   . Thyroid cancer Sister   . Diabetes Neg Hx   . Colon cancer Neg Hx   . Stomach cancer Neg Hx   . Esophageal cancer Neg Hx   . Rectal cancer Neg Hx   . Liver cancer Neg Hx     ROS: Review of Systems  Constitutional: Positive for weight loss.  Gastrointestinal: Negative for nausea and vomiting.  Musculoskeletal:       Negative muscle weakness  Endo/Heme/Allergies:       Negative polyphagia  Psychiatric/Behavioral: Positive for depression. Negative for suicidal ideas.    PHYSICAL EXAM: Blood pressure 92/64, pulse (!) 102, temperature 98.4 F (36.9 C), height 5\' 7"  (1.702 m), weight (!) 431 lb (195.5 kg), SpO2 97 %. Body mass index is 67.5 kg/m. Physical Exam  Constitutional: She is oriented to person, place, and time. She appears well-developed and well-nourished.  Cardiovascular:  Tachycardic  Pulmonary/Chest: Effort normal.  Musculoskeletal: Normal range of motion.  Neurological: She is oriented to person, place, and time.  Skin: Skin is warm and dry.  Psychiatric: She has a normal mood and affect. Her behavior is normal.  Vitals reviewed.   RECENT LABS AND  TESTS: BMET    Component Value Date/Time   NA 141 07/21/2017 0925   K 4.2 07/21/2017 0925   CL 103 07/21/2017 0925   CO2 22 07/21/2017 0925   GLUCOSE 98 07/21/2017 0925   GLUCOSE 83 06/04/2016 1155   BUN 13 07/21/2017 0925   CREATININE 0.76 07/21/2017 0925   CREATININE 0.90 06/04/2016 1155   CALCIUM 8.9 07/21/2017 0925   GFRNONAA 102 07/21/2017 0925   GFRNONAA 84 06/04/2016 1155   GFRAA 118 07/21/2017 0925   GFRAA >89 06/04/2016 1155   Lab Results  Component Value Date   HGBA1C 5.4 06/25/2017   HGBA1C 5.2 05/19/2017   HGBA1C 5.4 01/20/2017   HGBA1C 5.6 10/27/2016   HGBA1C 5.0 11/21/2015   Lab Results  Component Value Date   INSULIN 30.4 (H) 06/25/2017   INSULIN 29.7 (H) 01/20/2017   INSULIN 30.1 (H) 10/27/2016   CBC    Component Value Date/Time   WBC 13.3 (H) 07/03/2017 0610   RBC 4.33 07/03/2017 0610   HGB 11.2 (L) 07/03/2017 0610   HGB 11.2 05/18/2017 1611   HCT 34.6 (L) 07/03/2017 0610   HCT 35.6 05/18/2017 1611   PLT 293 07/03/2017 0610   PLT 343 05/18/2017 1611   MCV 79.9 07/03/2017 0610   MCV 85 05/18/2017 1611   MCH 25.9 (L) 07/03/2017 0610   MCHC 32.4 07/03/2017 0610   RDW 14.7 07/03/2017 0610   RDW 14.0 05/18/2017 1611   LYMPHSABS 4.4 (H) 07/03/2017 0610   LYMPHSABS 2.9 01/20/2017 0831   MONOABS 0.6 07/03/2017 0610   EOSABS 0.1 07/03/2017 0610   EOSABS 0.1 01/20/2017 0831   BASOSABS 0.0 07/03/2017 0610   BASOSABS 0.0 01/20/2017 0831   Iron/TIBC/Ferritin/ %Sat  Component Value Date/Time   IRON 25 (L) 05/18/2017 1611   TIBC 363 05/18/2017 1611   FERRITIN 16 05/18/2017 1611   IRONPCTSAT 7 (LL) 05/18/2017 1611   Lipid Panel     Component Value Date/Time   CHOL 152 10/27/2016 1058   TRIG 57 10/27/2016 1058   HDL 43 10/27/2016 1058   CHOLHDL 3.6 11/21/2015 1047   VLDL 10 11/21/2015 1047   LDLCALC 98 10/27/2016 1058   Hepatic Function Panel     Component Value Date/Time   PROT 7.0 06/25/2017 1026   ALBUMIN 3.9 06/25/2017 1026   AST  39 06/25/2017 1026   ALT 57 (H) 06/25/2017 1026   ALKPHOS 61 06/25/2017 1026   BILITOT 0.3 06/25/2017 1026      Component Value Date/Time   TSH 1.43 05/19/2017 1245   TSH 1.460 10/27/2016 1058   TSH 0.96 11/21/2015 1047    ASSESSMENT AND PLAN: Vitamin D deficiency - Plan: Vitamin D, Ergocalciferol, (DRISDOL) 50000 units CAPS capsule  Insulin resistance - Plan: metFORMIN (GLUCOPHAGE) 500 MG tablet  Other depression - with emotional eating - Plan: buPROPion (WELLBUTRIN SR) 200 MG 12 hr tablet  At risk for diabetes mellitus  Class 3 severe obesity with serious comorbidity and body mass index (BMI) of 60.0 to 69.9 in adult, unspecified obesity type (HCC)  PLAN:  Vitamin D Deficiency Catherine [name] was informed that low vitamin D levels contributes to fatigue and are associated with obesity, breast, and colon cancer. She agrees to continue to take prescription Vit D @50 ,000 IU every week #4 with no refills and will follow up for routine testing of vitamin D, at least 2-3 times per year. She was informed of the risk of over-replacement of vitamin D and agrees to not increase her dose unless he discusses this with us first. Catherine [name] agrees to follow up with our clinic in 2 weeks.  Insulin Resistance Catherine [name] will continue to work on weight loss, exercise, and decreasing simple carbohydrates in her diet to help decrease the risk of diabetes. We dicussed metformin including benefits and risks. She was informed that eating too many simple carbohydrates or too many calories at one sitting increases the likelihood of GI side effects. Catherine [name] agrees to increase metformin to 500 mg bid #60 with no refills. Catherine [name] agreed to follow up with us as directed to monitor her progress.  Depression with Emotional Eating Behaviors We discussed behavior modification techniques today to help Catherine [name] deal with her emotional eating and depression. She has agreed to take Wellbutrin SR 200 mg qd #30 with no refills and  will follow up as directed.  Obesity Catherine [name] is currently in the action stage of change. As such, her goal is to continue with weight loss efforts She has agreed to keep a food journal with 1400 to 1600 calories and 90+ grams of protein daily Catherine [name] has been instructed to work up to a goal of 150 minutes of combined cardio and strengthening exercise per week for weight loss and overall health benefits. We discussed the following Behavioral Modification Strategies today: increasing lean protein intake and keep a strict food journal  Catherine [name] has agreed to follow up with our clinic in 2 weeks. She was informed of the importance of frequent follow up visits to maximize her success with intensive lifestyle modifications for her multiple health conditions.  I, [place] CraneJoanne Murray, am acting as transcriptionist for Illa LevelSahar Tanique Matney, PA-C  I have reviewed the above documentation for accuracy and completeness, and I agree with the above. -  Illa Level, PA-C  I have reviewed the above note and agree with the plan. -Quillian Quince, MD  OBESITY BEHAVIORAL INTERVENTION VISIT  Today's visit was # 19 out of 22.  Starting weight: 468 lbs Starting date: 10/27/16 Today's weight : 431 lbs Today's date: 08/12/2017 Total lbs lost to date: 33 (Patients must lose 7 lbs in the first 6 months to continue with counseling)   ASK: We discussed the diagnosis of obesity with Catherine [name] today and Tristian agreed to give [name] permission to discuss obesity behavioral modification therapy today.  ASSESS: Hiromi has the diagnosis of obesity and her BMI today is 67.49 Jizelle is in the action stage of change   ADVISE: Kanijah was educated on the multiple health risks of obesity as well as the benefit of weight loss to improve her health. She was advised of the need for long term treatment and the importance of lifestyle modifications.  AGREE: Multiple dietary modification options and treatment options were discussed and   Daijanae agreed to keep a food journal with 1400 to 1600 calories and 90+ grams of protein daily We discussed the following Behavioral Modification Strategies today: increasing lean protein intake and keep a strict food journal

## 2017-08-17 ENCOUNTER — Ambulatory Visit: Payer: 59 | Admitting: Family Medicine

## 2017-08-17 MED FILL — GABAPENTIN 300 MG CAPSULE: 300 | 30 days supply | Qty: 180 | Fill #1

## 2017-08-23 IMAGING — US US PELVIS COMPLETE
1 series · 14 of 25 positions shown · non-contrast
Comparison: None

CLINICAL DATA: Dysfunctional uterine bleeding.

EXAM:
TRANSABDOMINAL AND TRANSVAGINAL ULTRASOUND OF PELVIS
TECHNIQUE: Both transabdominal and transvaginal ultrasound examinations of the
pelvis were performed. Transabdominal technique was performed for
global imaging of the pelvis including uterus, ovaries, adnexal
regions, and pelvic cul-de-sac. It was necessary to proceed with
endovaginal exam following the transabdominal exam to visualize the
uterus and ovaries.

[Series 1: us pelvis complete · 0.31mm/px · 14 of 44 slices shown]
[im 1/44]
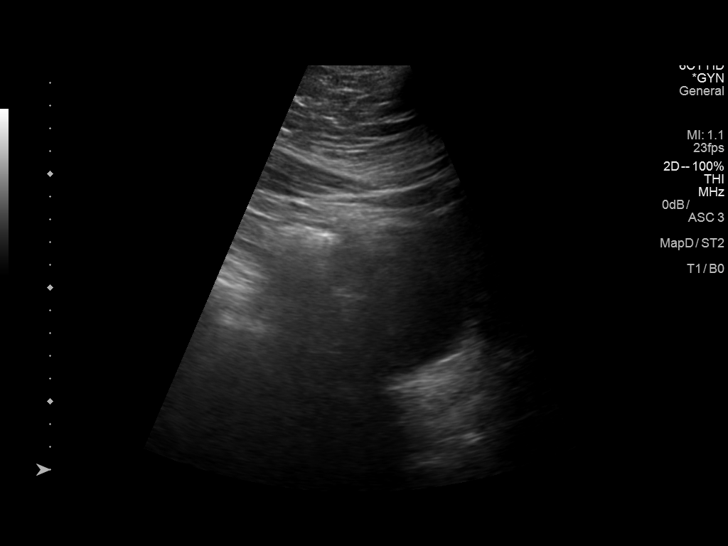
[im 4/44]
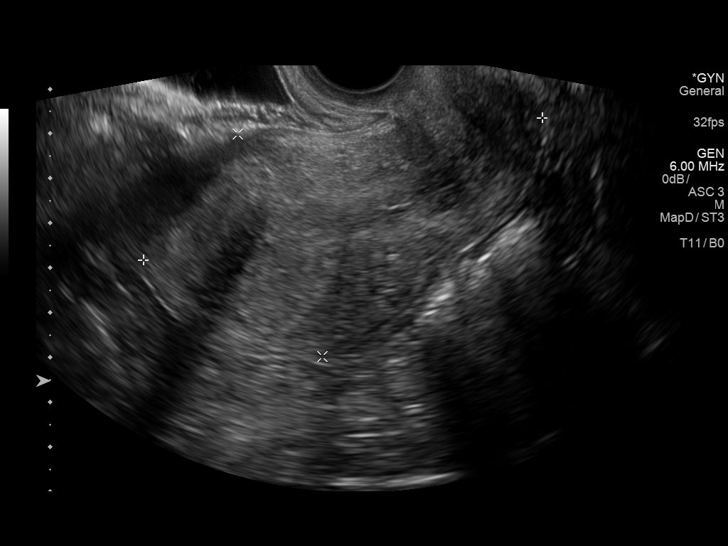
[im 8/44]
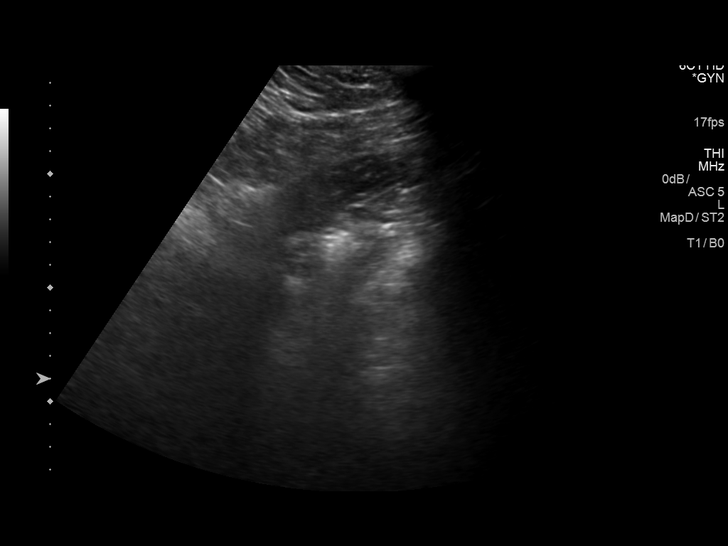
[im 11/44]
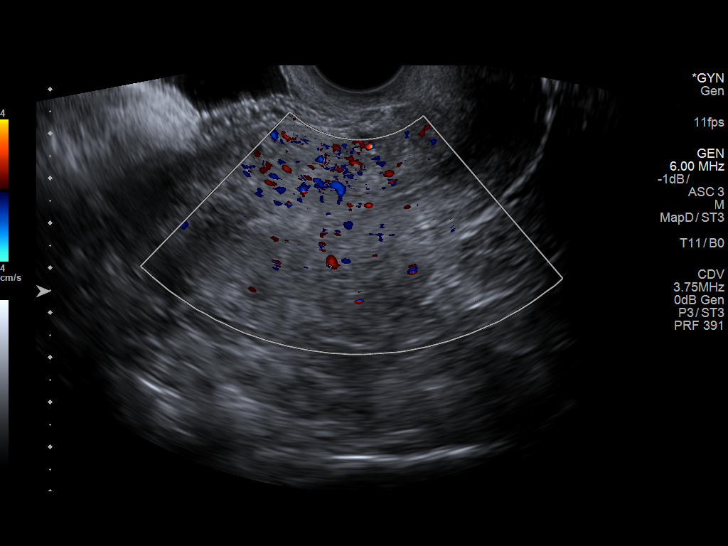
[im 15/44]
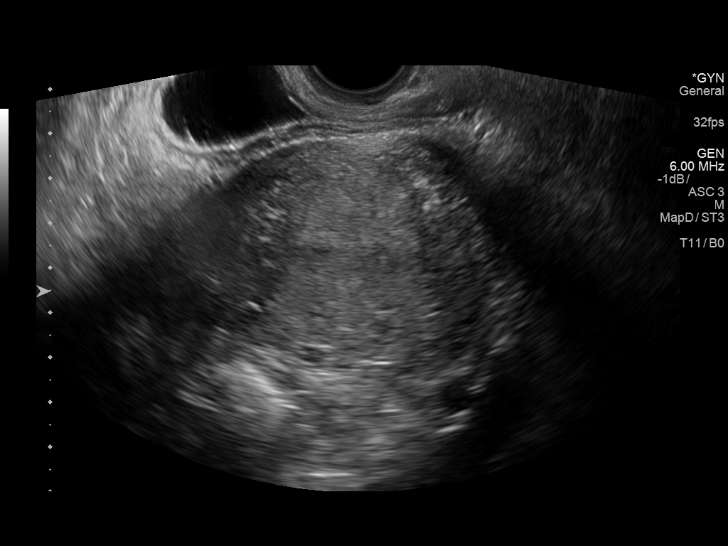
[im 17/44]
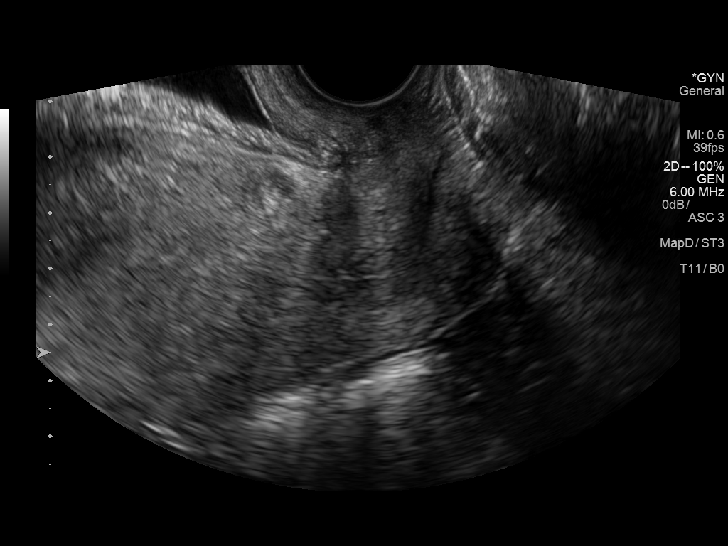
[im 20/44]
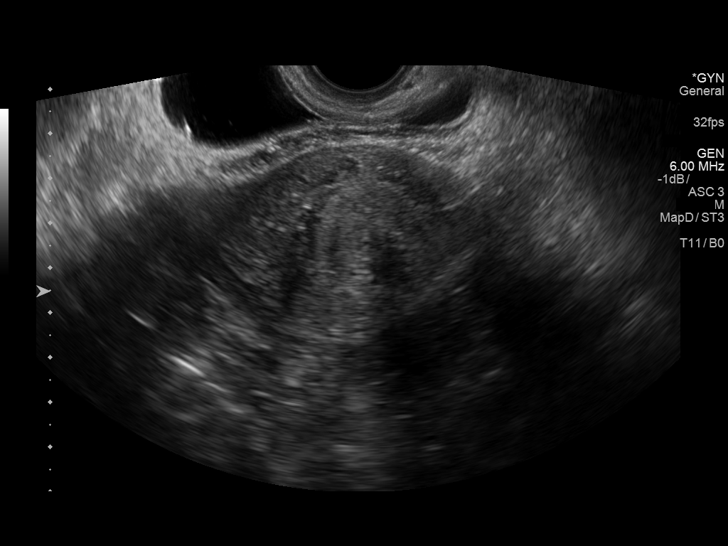
[im 24/44]
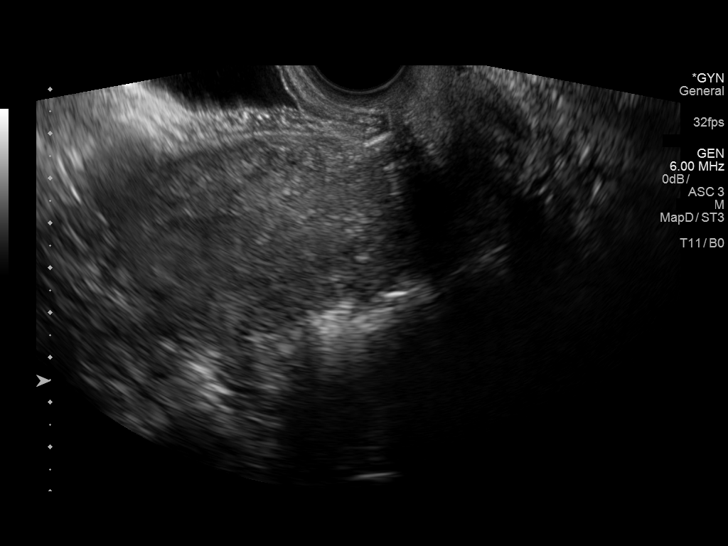
[im 27/44]
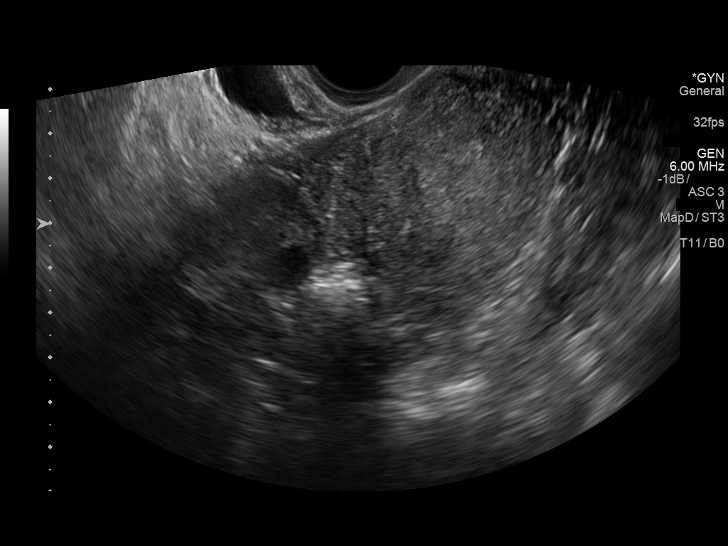
[im 29/44]
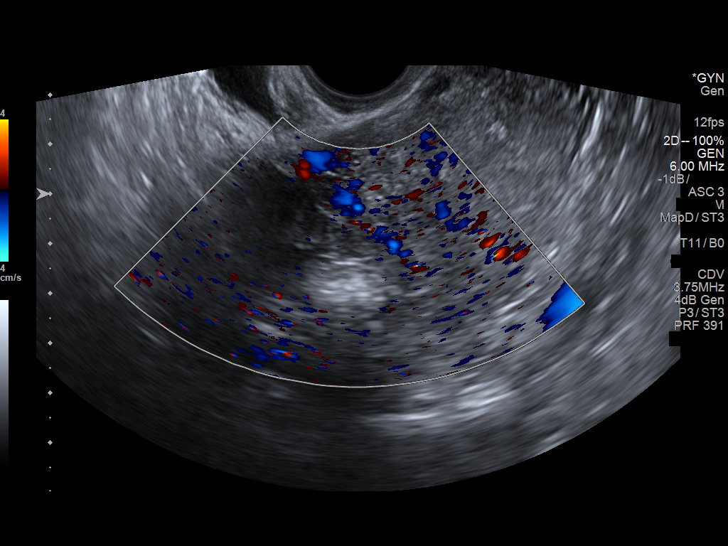
[im 33/44]
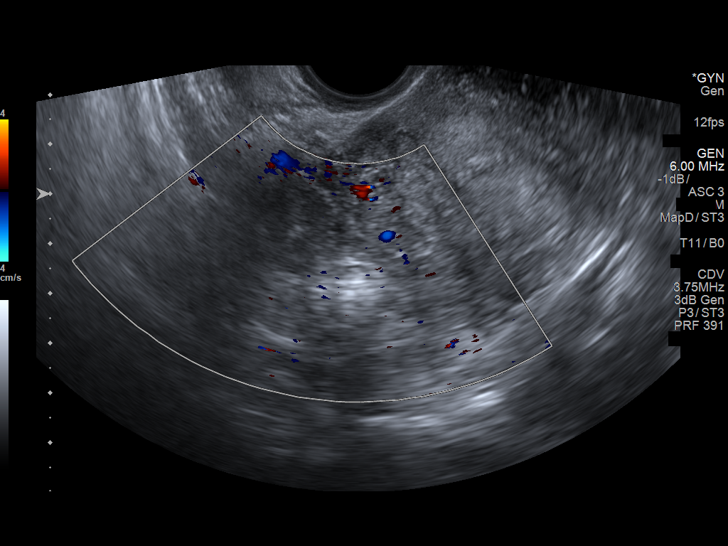
[im 36/44]
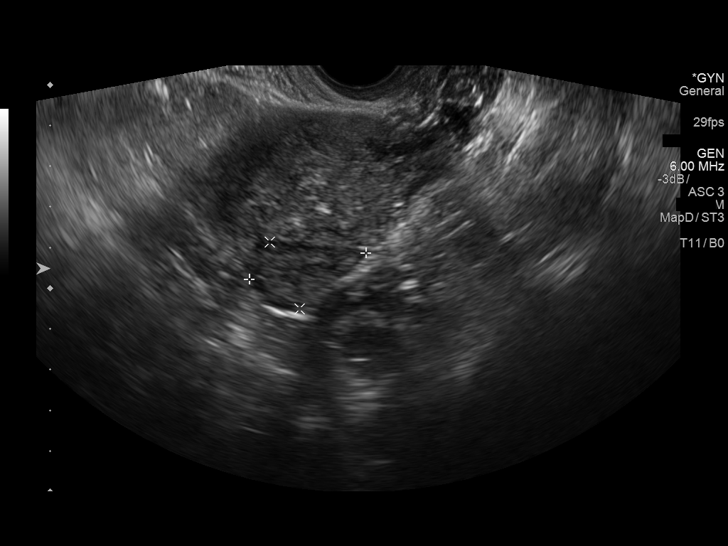
[im 40/44]
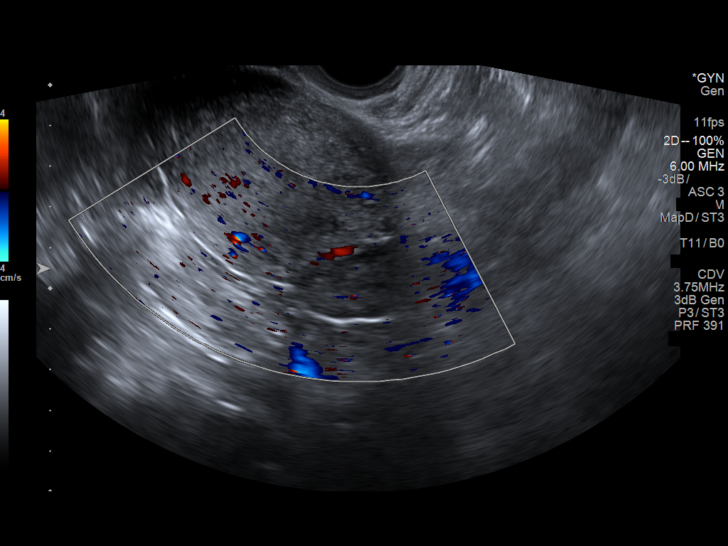
[im 44/44]
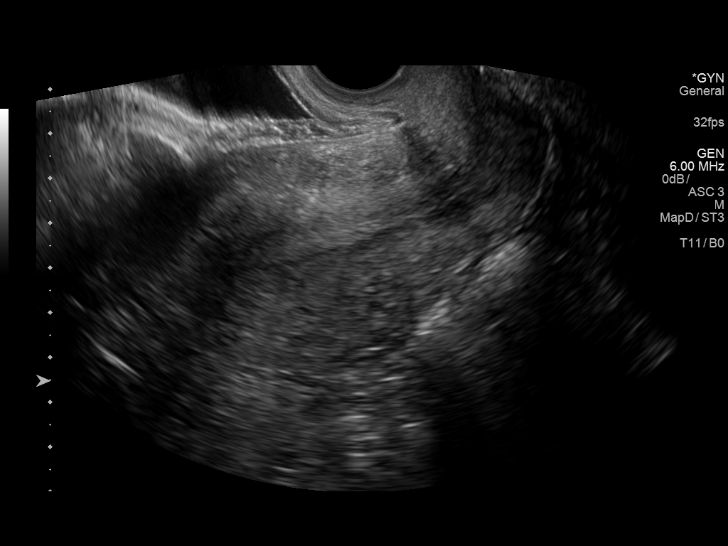

[14 of 25 positions shown; findings below may reference images not displayed]

FINDINGS: Uterus

Measurements: 9.5 x 5.3 x 6.7 cm. Heterogeneous parenchymal pattern.
No fibroids.

Endometrium

Thickness: 7.1 cm.  No focal abnormality visualized.

Right ovary

Measurements: 2.9 x 2.1 x 1.8 cm. Normal appearance/no adnexal mass.

Left ovary

Measurements: 2.9 x 1.8 x 2.4 cm. Normal appearance/no adnexal mass.

Other findings

No abnormal free fluid.  Limited exam due to patient's body habitus.
IMPRESSION: No acute or focal abnormality identified.

## 2017-08-26 ENCOUNTER — Ambulatory Visit (INDEPENDENT_AMBULATORY_CARE_PROVIDER_SITE_OTHER): Payer: 59 | Admitting: Physician Assistant

## 2017-08-26 VITALS — BP 111/75 | HR 117 | Temp 97.6°F | Ht 67.0 in | Wt >= 6400 oz

## 2017-08-26 DIAGNOSIS — R000 Tachycardia, unspecified: Secondary | ICD-10-CM

## 2017-08-26 DIAGNOSIS — Z6841 Body Mass Index (BMI) 40.0 and over, adult: Secondary | ICD-10-CM

## 2017-08-26 DIAGNOSIS — E559 Vitamin D deficiency, unspecified: Secondary | ICD-10-CM

## 2017-08-26 NOTE — Progress Notes (Signed)
Office: [phone]  /  Fax: [phone]   HPI:   Chief Complaint: OBESITY Catherine [name] is here to discuss her progress with her obesity treatment plan. She is on the keep a food journal with 1400 to 1600 calories and 90+ grams of protein daily and is following her eating plan approximately 85 % of the time. She states she is exercising 0 minutes 0 times per week. Catherine [name] maintained her weight. She would like more meal planning ideas and convenience meal options.  Her weight is (!) 431 lb (195.5 kg) today and has maintained weight over a period of 2 weeks since her last visit. She has lost 37 lbs since starting treatment with [name].  Vitamin D deficiency Catherine [name] has a diagnosis of vitamin D deficiency. She is currently taking vit D and denies nausea, vomiting or muscle weakness.  Tachycardia Catherine [name]'s heart rate is elevated at 117. On multiple previous visits, patient's heart rate has been over 100. She attributes her tachycardia to having her infant niece with her and be worried that she might be late for her visit. She denies any known recurrent tachycardia or history of heart murmur. She denies any palpitations, chest pain, dyspnea, headache, diaphoresis.    ALLERGIES: Allergies  Allergen Reactions  . Citrus Swelling    MEDICATIONS: Current Outpatient Medications on File Prior to Visit  Medication Sig Dispense Refill  . aspirin-acetaminophen-caffeine (EXCEDRIN MIGRAINE) 250-250-65 MG per tablet Take 2 tablets by mouth 2 (two) times daily as needed for migraine. For headache     . budesonide (PULMICORT) 180 MCG/ACT inhaler Inhale 1 puff into the lungs 2 (two) times daily. (Patient taking differently: Inhale 1 puff into the lungs 2 (two) times daily as needed (for shortness of breath). ) 1 Inhaler 1  . buPROPion (WELLBUTRIN SR) 200 MG 12 hr tablet Take 1 tablet (200 mg total) by mouth every morning. 30 tablet 0  . cetirizine (ZYRTEC) 10 MG tablet Take 10 mg by mouth daily.    .  diclofenac (CATAFLAM) 50 MG tablet Take 50 mg by mouth 2 (two) times daily at 10 AM and 5 PM.    . diclofenac sodium (VOLTAREN) 1 % GEL Apply 2 g topically 4 (four) times daily. (Patient taking differently: Apply 2 g topically 4 (four) times daily as needed (for pain.). ) 100 g 1  . furosemide (LASIX) 20 MG tablet Take 1 tablet (20 mg total) by mouth 2 (two) times daily. 60 tablet 3  . gabapentin (NEURONTIN) 300 MG capsule Take 2 capsules (600 mg total) by mouth 3 (three) times daily. (Patient taking differently: Take 300 mg by mouth 3 (three) times daily. ) 180 capsule 3  . ibuprofen (ADVIL,MOTRIN) 200 MG tablet Take 600-800 mg by mouth every 8 (eight) hours as needed (for pain.).     Catherine [name] metFORMIN (GLUCOPHAGE) 500 MG tablet Take 1 tablet (500 mg total) by mouth 2 (two) times daily with a meal. 60 tablet 0  . ranitidine (ZANTAC) 150 MG tablet TAKE 1 TABLET BY MOUTH 2 TIMES DAILY. 60 tablet 0  . Vitamin D, Ergocalciferol, (DRISDOL) 50000 units CAPS capsule Take 1 capsule (50,000 Units total) by mouth every 7 (seven) days. 4 capsule 0   No current facility-administered medications on file prior to visit.     PAST MEDICAL HISTORY: Past Medical History:  Diagnosis Date  . Allergy   . Anxiety   . Arthritis   . Asthma    since baby, seasonal  . ASTHMA, UNSPECIFIED 12/10/2006  Qualifier: Diagnosis of  By: Abundio Miu    . CELLULITIS AND ABSCESS OF LEG EXCEPT FOOT 12/30/2007   Qualifier: Diagnosis of  By: Daphine Deutscher FNP, Zena Amos    . Cyst of bone    right side of spine  . DEPENDENT EDEMA, LEGS, BILATERAL 11/22/2008   Qualifier: Diagnosis of  By: Barbaraann Barthel MD, [place]    . Depression 2003Dx  . Edema   . Food allergy    citris  . FOOT PAIN, RIGHT 11/22/2008   Qualifier: Diagnosis of  By: Barbaraann Barthel MD, [place]    . Gastric ulcer   . GERD (gastroesophageal reflux disease)   . Gout   . Hypertension    took Norvasc for 3 months, doctor discontinued no longer on BP medications  . Joint pain   .  KNEE INJURY, LEFT 01/01/2009   Qualifier: Diagnosis of  By: Delrae Alfred MD, Lanora Manis    . Migraine   . ONYCHOMYCOSIS, TOENAILS 11/22/2008   Qualifier: Diagnosis of  By: Barbaraann Barthel MD, [place]    . Shortness of breath   . Sleep apnea   . TINEA PEDIS 11/22/2008   Qualifier: Diagnosis of  By: Barbaraann Barthel MD, Benetta Spar      PAST SURGICAL HISTORY: Past Surgical History:  Procedure Laterality Date  . UPPER GI ENDOSCOPY    . WISDOM TOOTH EXTRACTION  2008   x4    SOCIAL HISTORY: Social History   Tobacco Use  . Smoking status: Former Smoker    Packs/day: 0.50    Years: 18.00    Pack years: 9.00    Types: Cigarettes    Start date: 10/13/1996    Last attempt to quit: 01/12/2015    Years since quitting: 2.6  . Smokeless tobacco: Never Used  Substance Use Topics  . Alcohol use: Yes    Alcohol/week: 0.6 oz    Types: 1 Standard drinks or equivalent per week    Comment: rarely drinks  . Drug use: No    Comment: marjuana - former use, quit 4 years ago    FAMILY HISTORY: Family History  Problem Relation Age of Onset  . Hypertension Mother   . Hyperlipidemia Mother   . Cancer Mother        bone cancer  . Heart disease Mother   . Sudden death Mother   . Stroke Mother   . Thyroid disease Mother   . Sleep apnea Mother   . Obesity Mother   . Sudden death Father   . Hypertension Father   . Heart disease Father   . Hyperlipidemia Father   . Prostate cancer Father   . Lung cancer Father   . Obesity Father   . Alcoholism Father   . Sudden death Brother   . Hypertension Brother   . Hyperlipidemia Brother   . Heart attack Brother   . Heart disease Brother   . Stroke Brother   . Alcoholism Brother   . Drug abuse Brother   . Heart disease Sister   . Hypertension Sister   . Thyroid cancer Sister   . Diabetes Neg Hx   . Colon cancer Neg Hx   . Stomach cancer Neg Hx   . Esophageal cancer Neg Hx   . Rectal cancer Neg Hx   . Liver cancer Neg Hx     ROS: Review of Systems    Constitutional: Negative for weight loss.  Gastrointestinal: Negative for nausea and vomiting.  Musculoskeletal:       Negative muscle weakness    PHYSICAL  EXAM: Blood pressure 111/75, pulse (!) 117, temperature 97.6 F (36.4 C), temperature source Oral, height 5\' 7"  (1.702 m), weight (!) 431 lb (195.5 kg), SpO2 98 %. Body mass index is 67.5 kg/m. Physical Exam  Constitutional: She is oriented to person, place, and time. She appears well-developed and well-nourished.  Cardiovascular:  Tachycardic   Pulmonary/Chest: Effort normal.  Musculoskeletal: Normal range of motion.  Neurological: She is oriented to person, place, and time.  Skin: Skin is warm and dry.  Psychiatric: She has a normal mood and affect. Her behavior is normal.  Vitals reviewed.   RECENT LABS AND TESTS: BMET    Component Value Date/Time   NA 141 07/21/2017 0925   K 4.2 07/21/2017 0925   CL 103 07/21/2017 0925   CO2 22 07/21/2017 0925   GLUCOSE 98 07/21/2017 0925   GLUCOSE 83 06/04/2016 1155   BUN 13 07/21/2017 0925   CREATININE 0.76 07/21/2017 0925   CREATININE 0.90 06/04/2016 1155   CALCIUM 8.9 07/21/2017 0925   GFRNONAA 102 07/21/2017 0925   GFRNONAA 84 06/04/2016 1155   GFRAA 118 07/21/2017 0925   GFRAA >89 06/04/2016 1155   Lab Results  Component Value Date   HGBA1C 5.4 06/25/2017   HGBA1C 5.2 05/19/2017   HGBA1C 5.4 01/20/2017   HGBA1C 5.6 10/27/2016   HGBA1C 5.0 11/21/2015   Lab Results  Component Value Date   INSULIN 30.4 (H) 06/25/2017   INSULIN 29.7 (H) 01/20/2017   INSULIN 30.1 (H) 10/27/2016   CBC    Component Value Date/Time   WBC 13.3 (H) 07/03/2017 0610   RBC 4.33 07/03/2017 0610   HGB 11.2 (L) 07/03/2017 0610   HGB 11.2 05/18/2017 1611   HCT 34.6 (L) 07/03/2017 0610   HCT 35.6 05/18/2017 1611   PLT 293 07/03/2017 0610   PLT 343 05/18/2017 1611   MCV 79.9 07/03/2017 0610   MCV 85 05/18/2017 1611   MCH 25.9 (L) 07/03/2017 0610   MCHC 32.4 07/03/2017 0610   RDW  14.7 07/03/2017 0610   RDW 14.0 05/18/2017 1611   LYMPHSABS 4.4 (H) 07/03/2017 0610   LYMPHSABS 2.9 01/20/2017 0831   MONOABS 0.6 07/03/2017 0610   EOSABS 0.1 07/03/2017 0610   EOSABS 0.1 01/20/2017 0831   BASOSABS 0.0 07/03/2017 0610   BASOSABS 0.0 01/20/2017 0831   Iron/TIBC/Ferritin/ %Sat    Component Value Date/Time   IRON 25 (L) 05/18/2017 1611   TIBC 363 05/18/2017 1611   FERRITIN 16 05/18/2017 1611   IRONPCTSAT 7 (LL) 05/18/2017 1611   Lipid Panel     Component Value Date/Time   CHOL 152 10/27/2016 1058   TRIG 57 10/27/2016 1058   HDL 43 10/27/2016 1058   CHOLHDL 3.6 11/21/2015 1047   VLDL 10 11/21/2015 1047   LDLCALC 98 10/27/2016 1058   Hepatic Function Panel     Component Value Date/Time   PROT 7.0 06/25/2017 1026   ALBUMIN 3.9 06/25/2017 1026   AST 39 06/25/2017 1026   ALT 57 (H) 06/25/2017 1026   ALKPHOS 61 06/25/2017 1026   BILITOT 0.3 06/25/2017 1026      Component Value Date/Time   TSH 1.43 05/19/2017 1245   TSH 1.460 10/27/2016 1058   TSH 0.96 11/21/2015 1047    ASSESSMENT AND PLAN: Vitamin D deficiency  Class 3 severe obesity with serious comorbidity and body mass index (BMI) of 60.0 to 69.9 in adult, unspecified obesity type (HCC)  PLAN:  Tachycardia [name] was informed of her tachycardia. She is informed  of the possible etiolgies of her tachycardia and of the potential risk of complications such as LOC, stroke, or death. She is informed that if this persists, or if she develops any other symptoms such as a heart murmur, shortness of breath, or chest pain, to go to the ER  Vitamin D Deficiency [name] was informed that low vitamin D levels contributes to fatigue and are associated with obesity, breast, and colon cancer. She agrees to continue to take prescription Vit D @50 ,000 IU every week and will follow up for routine testing of vitamin D, at least 2-3 times per year. She was informed of the risk of over-replacement of vitamin D and agrees  to not increase her dose unless he discusses this with us first.  We spent > than 50% of the 15 minute visit on the counseling as documented in the note.  Obesity [name] is currently in the action stage of change. As such, her goal is to continue with weight loss efforts She has agreed to keep a food journal with 1600 calories and 90 grams pf protein  daily [name] has been instructed to work up to a goal of 150 minutes of combined cardio and strengthening exercise per week for weight loss and overall health benefits. We discussed the following Behavioral Modification Strategies today: increasing lean protein intake  [name] has agreed to follow up with our clinic in 2 weeks. She was informed of the importance of frequent follow up visits to maximize her success with intensive lifestyle modifications for her multiple health conditions.  I, [place] CraneJoanne Murray, am acting as transcriptionist for Illa LevelSahar Lashanta Elbe, PA-C  I have reviewed the above documentation for accuracy and completeness, and I agree with the above. -Illa LevelSahar Reuven Braver, PA-C  I have reviewed the above note and agree with the plan. -Quillian Quincearen Beasley, MD  OBESITY BEHAVIORAL INTERVENTION VISIT  Today's visit was # 20 out of 22.  Starting weight: 468 lbs Starting date: 10/27/16 Today's weight : 431 lbs Today's date: 08/26/2017 Total lbs lost to date: 5837 (Patients must lose 7 lbs in the first 6 months to continue with counseling)   ASK: We discussed the diagnosis of obesity with Heywood IlesJansala N Sukhu today and [name] agreed to give us permission to discuss obesity behavioral modification therapy today.  ASSESS: [name] has the diagnosis of obesity and her BMI today is 67.49 [name] is in the action stage of change   ADVISE: [name] was educated on the multiple health risks of obesity as well as the benefit of weight loss to improve her health. She was advised of the need for long term treatment and the importance of lifestyle  modifications.  AGREE: Multiple dietary modification options and treatment options were discussed and  Mylah agreed to keep a food journal with 1600 calories and 90 grams of protein daily We discussed the following Behavioral Modification Strategies today: increasing lean protein intake

## 2017-09-08 ENCOUNTER — Ambulatory Visit (INDEPENDENT_AMBULATORY_CARE_PROVIDER_SITE_OTHER): Payer: 59 | Admitting: Physician Assistant

## 2017-09-08 VITALS — BP 108/67 | HR 89 | Temp 97.6°F | Ht 67.0 in | Wt >= 6400 oz

## 2017-09-08 DIAGNOSIS — Z6841 Body Mass Index (BMI) 40.0 and over, adult: Secondary | ICD-10-CM

## 2017-09-08 DIAGNOSIS — E559 Vitamin D deficiency, unspecified: Secondary | ICD-10-CM | POA: Diagnosis not present

## 2017-09-08 DIAGNOSIS — Z9189 Other specified personal risk factors, not elsewhere classified: Secondary | ICD-10-CM

## 2017-09-08 DIAGNOSIS — F3289 Other specified depressive episodes: Secondary | ICD-10-CM

## 2017-09-08 DIAGNOSIS — R7303 Prediabetes: Secondary | ICD-10-CM | POA: Diagnosis not present

## 2017-09-08 MED ORDER — METFORMIN HCL 500 MG PO TABS: 500.0000 mg | ORAL_TABLET | Freq: Two times a day (BID) | ORAL | 0 refills | Status: DC

## 2017-09-08 MED ORDER — LIRAGLUTIDE -WEIGHT MANAGEMENT 18 MG/3ML ~~LOC~~ SOPN: 3.0000 mg | PEN_INJECTOR | Freq: Every day | SUBCUTANEOUS | 0 refills | Status: DC

## 2017-09-08 MED ORDER — BUPROPION HCL ER (SR) 200 MG PO TB12: 200.0000 mg | ORAL_TABLET | Freq: Every morning | ORAL | 0 refills | Status: DC

## 2017-09-08 MED ORDER — VITAMIN D (ERGOCALCIFEROL) 1.25 MG (50000 UNIT) PO CAPS: 50000.0000 [IU] | ORAL_CAPSULE | ORAL | 0 refills | Status: DC

## 2017-09-08 MED FILL — BUPROPION HCL SR 200 MG TAB: 200 | 30 days supply | Qty: 30 | Fill #0

## 2017-09-08 MED FILL — metFORMIN HCL 500 MG TABS: 500 | 30 days supply | Qty: 60 | Fill #0

## 2017-09-08 MED FILL — VIT D2 1.25 MG (50,000 UNIT: 1.25 MG | 28 days supply | Qty: 4 | Fill #0

## 2017-09-08 NOTE — Progress Notes (Signed)
Office: [phone]  /  Fax: [phone]   HPI:   Chief Complaint: OBESITY [name] is here to discuss her progress with her obesity treatment plan. She is on the keep a food journal with 1600 calories and 90 grams of protein daily and is following her eating plan approximately 85 % of the time. She states she is exercising 0 minutes 0 times per week. [name] continues to be mindful of her eating. She is interested in weight loss medicine and wants to discuss her options.  Her weight is (!) 434 lb (196.9 kg) today and has gained 3 pounds since her last visit. She has lost 34 lbs since starting treatment with us.  Pre-Diabetes [name] has a diagnosis of pre-diabetes based on her elevated Hgb A1c and was informed this puts her at greater risk of developing diabetes. She is taking metformin currently and continues to work on diet and exercise to decrease risk of diabetes. She denies nausea, polyphagia, or hypoglycemia.  At risk for diabetes [name] is at higher than average risk for developing diabetes due to her obesity and pre-diabetes. She currently denies polyuria or polydipsia.  Vitamin D deficiency [name] has a diagnosis of vitamin D deficiency. She is currently taking prescription Vit D and denies nausea, vomiting or muscle weakness.  Depression with emotional eating behaviors [name] is struggling with emotional eating and using food for comfort to the extent that it is negatively impacting her health. She often snacks when she is not hungry. [name] sometimes feels she is out of control and then feels guilty that she made poor food choices. She has been working on behavior modification techniques to help reduce her emotional eating and has been somewhat successful. Her mood is stable and she shows no sign of suicidal or homicidal ideations.  [facility] 07/14/2017 05/18/2017 03/24/2017 02/04/2017 11/21/2016  Decreased Interest 0 0 0 0 0  Down, Depressed, Hopeless 0 0 0 0 0    PHQ - 2 Score 0 0 0 0 0  Altered sleeping - - - - -  Tired, decreased energy - - - - -  Change in appetite - - - - -  Feeling bad or failure about yourself  - - - - -  Trouble concentrating - - - - -  Moving slowly or fidgety/restless - - - - -  Suicidal thoughts - - - - -  PHQ-9 Score - - - - -  Some encounter information is confidential and restricted. Go to Review Flowsheets activity to see all data.   ALLERGIES: Allergies  Allergen Reactions  . Citrus Swelling    MEDICATIONS: Current Outpatient Medications on File Prior to Visit  Medication Sig Dispense Refill  . aspirin-acetaminophen-caffeine (EXCEDRIN MIGRAINE) 250-250-65 MG per tablet Take 2 tablets by mouth 2 (two) times daily as needed for migraine. For headache     . budesonide (PULMICORT) 180 MCG/ACT inhaler Inhale 1 puff into the lungs 2 (two) times daily. (Patient taking differently: Inhale 1 puff into the lungs 2 (two) times daily as needed (for shortness of breath). ) 1 Inhaler 1  . buPROPion (WELLBUTRIN SR) 200 MG 12 hr tablet Take 1 tablet (200 mg total) by mouth every morning. 30 tablet 0  . cetirizine (ZYRTEC) 10 MG tablet Take 10 mg by mouth daily.    . diclofenac (CATAFLAM) 50 MG tablet Take 50 mg by mouth 2 (two) times daily at 10 AM and 5 PM.    . diclofenac sodium (VOLTAREN) 1 %  GEL Apply 2 g topically 4 (four) times daily. (Patient taking differently: Apply 2 g topically 4 (four) times daily as needed (for pain.). ) 100 g 1  . furosemide (LASIX) 20 MG tablet Take 1 tablet (20 mg total) by mouth 2 (two) times daily. 60 tablet 3  . gabapentin (NEURONTIN) 300 MG capsule Take 2 capsules (600 mg total) by mouth 3 (three) times daily. (Patient taking differently: Take 300 mg by mouth 3 (three) times daily. ) 180 capsule 3  . ibuprofen (ADVIL,MOTRIN) 200 MG tablet Take 600-800 mg by mouth every 8 (eight) hours as needed (for pain.).     Marland Kitchen. metFORMIN (GLUCOPHAGE) 500 MG tablet Take 1 tablet (500 mg total) by mouth 2  (two) times daily with a meal. 60 tablet 0  . ranitidine (ZANTAC) 150 MG tablet TAKE 1 TABLET BY MOUTH 2 TIMES DAILY. 60 tablet 0  . Vitamin D, Ergocalciferol, (DRISDOL) 50000 units CAPS capsule Take 1 capsule (50,000 Units total) by mouth every 7 (seven) days. 4 capsule 0   No current facility-administered medications on file prior to visit.     PAST MEDICAL HISTORY: Past Medical History:  Diagnosis Date  . Allergy   . Anxiety   . Arthritis   . Asthma    since baby, seasonal  . ASTHMA, UNSPECIFIED 12/10/2006   Qualifier: Diagnosis of  By: Abundio MiuMcGregor, Barbara    . CELLULITIS AND ABSCESS OF LEG EXCEPT FOOT 12/30/2007   Qualifier: Diagnosis of  By: Daphine DeutscherMartin FNP, Zena AmosNykedtra    . Cyst of bone    right side of spine  . DEPENDENT EDEMA, LEGS, BILATERAL 11/22/2008   Qualifier: Diagnosis of  By: Barbaraann Barthelankins MD, [place]    . Depression 2003Dx  . Edema   . Food allergy    citris  . FOOT PAIN, RIGHT 11/22/2008   Qualifier: Diagnosis of  By: Barbaraann Barthelankins MD, [place]    . Gastric ulcer   . GERD (gastroesophageal reflux disease)   . Gout   . Hypertension    took Norvasc for 3 months, doctor discontinued no longer on BP medications  . Joint pain   . KNEE INJURY, LEFT 01/01/2009   Qualifier: Diagnosis of  By: Delrae AlfredMulberry MD, Lanora ManisElizabeth    . Migraine   . ONYCHOMYCOSIS, TOENAILS 11/22/2008   Qualifier: Diagnosis of  By: Barbaraann Barthelankins MD, [place]    . Shortness of breath   . Sleep apnea   . TINEA PEDIS 11/22/2008   Qualifier: Diagnosis of  By: Barbaraann Barthelankins MD, Benetta SparVictoria      PAST SURGICAL HISTORY: Past Surgical History:  Procedure Laterality Date  . DILITATION & CURRETTAGE/HYSTROSCOPY WITH NOVASURE ABLATION N/A 07/03/2017   Procedure: DILATATION & CURETTAGE/HYSTEROSCOPY WITH NOVASURE ABLATION;  Surgeon: Genia DelLavoie, Marie-Lyne, MD;  Location: [facility];  Service: Gynecology;  Laterality: N/A;  . UPPER GI ENDOSCOPY    . WISDOM TOOTH EXTRACTION  2008   x4    SOCIAL HISTORY: Social History   Tobacco Use  . Smoking status:  Former Smoker    Packs/day: 0.50    Years: 18.00    Pack years: 9.00    Types: Cigarettes    Start date: 10/13/1996    Last attempt to quit: 01/12/2015    Years since quitting: 2.6  . Smokeless tobacco: Never Used  Substance Use Topics  . Alcohol use: Yes    Alcohol/week: 0.6 oz    Types: 1 Standard drinks or equivalent per week    Comment: rarely drinks  . Drug use: No  Comment: marjuana - former use, quit 4 years ago    FAMILY HISTORY: Family History  Problem Relation Age of Onset  . Hypertension Mother   . Hyperlipidemia Mother   . Cancer Mother        bone cancer  . Heart disease Mother   . Sudden death Mother   . Stroke Mother   . Thyroid disease Mother   . Sleep apnea Mother   . Obesity Mother   . Sudden death Father   . Hypertension Father   . Heart disease Father   . Hyperlipidemia Father   . Prostate cancer Father   . Lung cancer Father   . Obesity Father   . Alcoholism Father   . Sudden death Brother   . Hypertension Brother   . Hyperlipidemia Brother   . Heart attack Brother   . Heart disease Brother   . Stroke Brother   . Alcoholism Brother   . Drug abuse Brother   . Heart disease Sister   . Hypertension Sister   . Thyroid cancer Sister   . Diabetes Neg Hx   . Colon cancer Neg Hx   . Stomach cancer Neg Hx   . Esophageal cancer Neg Hx   . Rectal cancer Neg Hx   . Liver cancer Neg Hx     ROS: Review of Systems  Constitutional: Negative for weight loss.  Gastrointestinal: Negative for nausea and vomiting.  Genitourinary: Negative for frequency.  Musculoskeletal:       Negative muscle weakness   Endo/Heme/Allergies: Negative for polydipsia.       Negative polyphagia Negative hypoglycemia  Psychiatric/Behavioral: Positive for depression. Negative for suicidal ideas.    PHYSICAL EXAM: Blood pressure 108/67, pulse 89, temperature 97.6 F (36.4 C), temperature source Oral, height 5\' 7"  (1.702 m), weight (!) 434 lb (196.9 kg), SpO2 97  %. Body mass index is 67.97 kg/m. Physical Exam  Constitutional: She is oriented to person, place, and time. She appears well-developed and well-nourished.  Cardiovascular: Normal rate.  Pulmonary/Chest: Effort normal.  Musculoskeletal: Normal range of motion.  Neurological: She is oriented to person, place, and time.  Skin: Skin is warm and dry.  Psychiatric: She has a normal mood and affect.  Vitals reviewed.   RECENT LABS AND TESTS: BMET    Component Value Date/Time   NA 141 07/21/2017 0925   K 4.2 07/21/2017 0925   CL 103 07/21/2017 0925   CO2 22 07/21/2017 0925   GLUCOSE 98 07/21/2017 0925   GLUCOSE 83 06/04/2016 1155   BUN 13 07/21/2017 0925   CREATININE 0.76 07/21/2017 0925   CREATININE 0.90 06/04/2016 1155   CALCIUM 8.9 07/21/2017 0925   GFRNONAA 102 07/21/2017 0925   GFRNONAA 84 06/04/2016 1155   GFRAA 118 07/21/2017 0925   GFRAA >89 06/04/2016 1155   Lab Results  Component Value Date   HGBA1C 5.4 06/25/2017   HGBA1C 5.2 05/19/2017   HGBA1C 5.4 01/20/2017   HGBA1C 5.6 10/27/2016   HGBA1C 5.0 11/21/2015   Lab Results  Component Value Date   INSULIN 30.4 (H) 06/25/2017   INSULIN 29.7 (H) 01/20/2017   INSULIN 30.1 (H) 10/27/2016   CBC    Component Value Date/Time   WBC 13.3 (H) 07/03/2017 0610   RBC 4.33 07/03/2017 0610   HGB 11.2 (L) 07/03/2017 0610   HGB 11.2 05/18/2017 1611   HCT 34.6 (L) 07/03/2017 0610   HCT 35.6 05/18/2017 1611   PLT 293 07/03/2017 0610   PLT 343 05/18/2017  1611   MCV 79.9 07/03/2017 0610   MCV 85 05/18/2017 1611   MCH 25.9 (L) 07/03/2017 0610   MCHC 32.4 07/03/2017 0610   RDW 14.7 07/03/2017 0610   RDW 14.0 05/18/2017 1611   LYMPHSABS 4.4 (H) 07/03/2017 0610   LYMPHSABS 2.9 01/20/2017 0831   MONOABS 0.6 07/03/2017 0610   EOSABS 0.1 07/03/2017 0610   EOSABS 0.1 01/20/2017 0831   BASOSABS 0.0 07/03/2017 0610   BASOSABS 0.0 01/20/2017 0831   Iron/TIBC/Ferritin/ %Sat    Component Value Date/Time   IRON 25 (L)  05/18/2017 1611   TIBC 363 05/18/2017 1611   FERRITIN 16 05/18/2017 1611   IRONPCTSAT 7 (LL) 05/18/2017 1611   Lipid Panel     Component Value Date/Time   CHOL 152 10/27/2016 1058   TRIG 57 10/27/2016 1058   HDL 43 10/27/2016 1058   CHOLHDL 3.6 11/21/2015 1047   VLDL 10 11/21/2015 1047   LDLCALC 98 10/27/2016 1058   Hepatic Function Panel     Component Value Date/Time   PROT 7.0 06/25/2017 1026   ALBUMIN 3.9 06/25/2017 1026   AST 39 06/25/2017 1026   ALT 57 (H) 06/25/2017 1026   ALKPHOS 61 06/25/2017 1026   BILITOT 0.3 06/25/2017 1026      Component Value Date/Time   TSH 1.43 05/19/2017 1245   TSH 1.460 10/27/2016 1058   TSH 0.96 11/21/2015 1047    ASSESSMENT AND PLAN: Prediabetes - Plan: metFORMIN (GLUCOPHAGE) 500 MG tablet  Vitamin D deficiency - Plan: Vitamin D, Ergocalciferol, (DRISDOL) 50000 units CAPS capsule  Other depression - with emotional eating - Plan: buPROPion (WELLBUTRIN SR) 200 MG 12 hr tablet  At risk for diabetes mellitus  Class 3 severe obesity with serious comorbidity and body mass index (BMI) of 60.0 to 69.9 in adult, unspecified obesity type (HCC) - Plan: Liraglutide -Weight Management (SAXENDA) 18 MG/3ML SOPN  PLAN:  Pre-Diabetes Latoyna will continue to work on weight loss, exercise, and decreasing simple carbohydrates in her diet to help decrease the risk of diabetes. We dicussed metformin including benefits and risks. She was informed that eating too many simple carbohydrates or too many calories at one sitting increases the likelihood of GI side effects. Erianna agrees to continue taking metformin 500 mg BID #60 and we will refill for 1 month. Canisha agrees to follow up with our clinic in 2 weeks as directed to monitor her progress.  Diabetes risk counselling Makenlee was given extended (15 minutes) diabetes prevention counseling today. She is 36 y.o. female and has risk factors for diabetes including obesity and pre-diabetes. We discussed  intensive lifestyle modifications today with an emphasis on weight loss as well as increasing exercise and decreasing simple carbohydrates in her diet.  Vitamin D Deficiency Japneet was informed that low vitamin D levels contributes to fatigue and are associated with obesity, breast, and colon cancer. Nevaya agrees to continue taking prescription Vit D @50 ,000 IU every week #4 and we will refill for 1 month. She will follow up for routine testing of vitamin D, at least 2-3 times per year. She was informed of the risk of over-replacement of vitamin D and agrees to not increase her dose unless he discusses this with [name] first. Dai agrees to follow up with our clinic in 2 weeks.  Depression with Emotional Eating Behaviors We discussed behavior modification techniques today to help Yaretzi deal with her emotional eating and depression. Geriann agrees to continue taking Wellbutrin SR 200 mg q AM #30 and we  will refill for 1 month. Bobi agrees to follow up with our clinic in 2 weeks.  Obesity Danah is currently in the action stage of change. As such, her goal is to continue with weight loss efforts She has agreed to keep a food journal with 1600 calories and 90 grams of protein daily Jannessa has been instructed to work up to a goal of 150 minutes of combined cardio and strengthening exercise per week for weight loss and overall health benefits. We discussed the following Behavioral Modification Strategies today: increasing lean protein intake and keeping healthy foods in the home  We discussed various medication options to help Norlene with her weight loss efforts and we both agreed to start Saxenda (5 pens). She has been on Phentermine in the past.  Claressa has agreed to follow up with our clinic in 2 weeks. She was informed of the importance of frequent follow up visits to maximize her success with intensive lifestyle modifications for her multiple health conditions.  I, Burt Knack, am  acting as transcriptionist for Illa Level, PA-C  I have reviewed the above documentation for accuracy and completeness, and I agree with the above. -Illa Level, PA-C  I have reviewed the above note and agree with the plan. -Quillian Quince, MD     Today's visit was # 21 out of 22.  Starting weight: 468 lbs Starting date: 10/27/16 Today's weight : 434 lbs  Today's date: 09/08/2017 Total lbs lost to date: 4 (Patients must lose 7 lbs in the first 6 months to continue with counseling)   ASK: We discussed the diagnosis of obesity with [name] today and Makynzi agreed to give [name] permission to discuss obesity behavioral modification therapy today.  ASSESS: Daesha has the diagnosis of obesity and her BMI today is 67.96 Terrie is in the action stage of change   ADVISE: Lanah was educated on the multiple health risks of obesity as well as the benefit of weight loss to improve her health. She was advised of the need for long term treatment and the importance of lifestyle modifications.  AGREE: Multiple dietary modification options and treatment options were discussed and  Rashidah agreed to keep a food journal with 1600 calories and 90 grams of protein daily We discussed the following Behavioral Modification Strategies today: increasing lean protein intake and keeping healthy foods in the home

## 2017-09-10 ENCOUNTER — Encounter (INDEPENDENT_AMBULATORY_CARE_PROVIDER_SITE_OTHER): Payer: Self-pay | Admitting: Physician Assistant

## 2017-09-10 MED FILL — GABAPENTIN 300 MG CAPSULE: 300 | 30 days supply | Qty: 180 | Fill #2

## 2017-09-15 MED FILL — SAXENDA 18 MG/3 ML PEN: 18 | 30 days supply | Qty: 15 | Fill #0

## 2017-09-16 ENCOUNTER — Other Ambulatory Visit (INDEPENDENT_AMBULATORY_CARE_PROVIDER_SITE_OTHER): Payer: Self-pay

## 2017-09-16 DIAGNOSIS — Z6841 Body Mass Index (BMI) 40.0 and over, adult: Principal | ICD-10-CM

## 2017-09-16 MED ORDER — INSULIN PEN NEEDLE 32G X 4 MM MISC: 1.0000 | Freq: Every day | 0 refills | Status: DC

## 2017-09-21 ENCOUNTER — Encounter (INDEPENDENT_AMBULATORY_CARE_PROVIDER_SITE_OTHER): Payer: Self-pay | Admitting: Physician Assistant

## 2017-09-22 ENCOUNTER — Ambulatory Visit (INDEPENDENT_AMBULATORY_CARE_PROVIDER_SITE_OTHER): Payer: 59 | Admitting: Physician Assistant

## 2017-09-22 ENCOUNTER — Encounter (INDEPENDENT_AMBULATORY_CARE_PROVIDER_SITE_OTHER): Payer: Self-pay

## 2017-09-22 ENCOUNTER — Encounter (INDEPENDENT_AMBULATORY_CARE_PROVIDER_SITE_OTHER): Payer: Self-pay | Admitting: Physician Assistant

## 2017-09-24 MED FILL — FUROSEMIDE 20 MG TABS: 20 | 30 days supply | Qty: 60 | Fill #1

## 2017-10-07 ENCOUNTER — Encounter (INDEPENDENT_AMBULATORY_CARE_PROVIDER_SITE_OTHER): Payer: Self-pay | Admitting: Physician Assistant

## 2017-10-13 ENCOUNTER — Encounter (INDEPENDENT_AMBULATORY_CARE_PROVIDER_SITE_OTHER): Payer: Self-pay | Admitting: Physician Assistant

## 2017-10-14 ENCOUNTER — Other Ambulatory Visit (INDEPENDENT_AMBULATORY_CARE_PROVIDER_SITE_OTHER): Payer: Self-pay

## 2017-10-14 DIAGNOSIS — E559 Vitamin D deficiency, unspecified: Secondary | ICD-10-CM

## 2017-10-14 MED ORDER — VITAMIN D (ERGOCALCIFEROL) 1.25 MG (50000 UNIT) PO CAPS: 50000.0000 [IU] | ORAL_CAPSULE | ORAL | 0 refills | Status: DC

## 2017-10-22 ENCOUNTER — Ambulatory Visit (INDEPENDENT_AMBULATORY_CARE_PROVIDER_SITE_OTHER): Payer: 59 | Admitting: Physician Assistant

## 2017-10-22 VITALS — BP 96/65 | HR 81 | Temp 97.6°F | Ht 67.0 in | Wt >= 6400 oz

## 2017-10-22 DIAGNOSIS — Z6841 Body Mass Index (BMI) 40.0 and over, adult: Secondary | ICD-10-CM

## 2017-10-22 DIAGNOSIS — R7303 Prediabetes: Secondary | ICD-10-CM

## 2017-10-22 DIAGNOSIS — F3289 Other specified depressive episodes: Secondary | ICD-10-CM | POA: Diagnosis not present

## 2017-10-22 NOTE — Progress Notes (Addendum)
Office: [phone]  /  Fax: [phone]   HPI:   Chief Complaint: OBESITY Catherine [name] is here to discuss her progress with her obesity treatment plan. She is on the keep a food journal with 1600 calories and 90 grams of protein daily and is following her eating plan approximately 85 % of the time. She states she is walking for 15-20 minutes 1-2 times per week. Catherine [name] continues to do well with weight loss. She states Catherine [name] has helped with cravings and appetite control. She is tolerating it well but has diarrhea and belching after high calorie or high carbohydrate meals.  Her weight is (!) 429 lb (194.6 kg) today and has had a weight loss of 5 pounds over a period of 6 weeks since her last visit. She has lost 0 lbs since starting treatment with [name].  Pre-Diabetes Catherine [name] has a diagnosis of pre-diabetes based on her elevated Hgb A1c and was informed this puts her at greater risk of developing diabetes. She is taking metformin currently and continues to work on diet and exercise to decrease risk of diabetes. She denies polyphagia, nausea, or hypoglycemia.  Depression with emotional eating behaviors Catherine [name] states she has not had cravings after starting Saxenda. Catherine [name] struggles with emotional eating and using food for comfort to the extent that it is negatively impacting her health. She often snacks when she is not hungry. Catherine [name] sometimes feels she is out of control and then feels guilty that she made poor food choices. She has been working on behavior modification techniques to help reduce her emotional eating and has been somewhat successful. She shows no sign of suicidal or homicidal ideations.  Depression screen [facility] 2/9 07/14/2017 05/18/2017 03/24/2017 02/04/2017 11/21/2016  Decreased Interest 0 0 0 0 0  Down, Depressed, Hopeless 0 0 0 0 0  PHQ - 2 Score 0 0 0 0 0  Altered sleeping - - - - -  Tired, decreased energy - - - - -  Change in appetite - - - - -  Feeling bad or failure about  yourself  - - - - -  Trouble concentrating - - - - -  Moving slowly or fidgety/restless - - - - -  Suicidal thoughts - - - - -  PHQ-9 Score - - - - -  Some encounter information is confidential and restricted. Go to Review Flowsheets activity to see all data.   ALLERGIES: Allergies  Allergen Reactions  . Citrus Swelling    MEDICATIONS: Current Outpatient Medications on File Prior to Visit  Medication Sig Dispense Refill  . aspirin-acetaminophen-caffeine (EXCEDRIN MIGRAINE) 250-250-65 MG per tablet Take 2 tablets by mouth 2 (two) times daily as needed for migraine. For headache     . budesonide (PULMICORT) 180 MCG/ACT inhaler Inhale 1 puff into the lungs 2 (two) times daily. 1 Inhaler 1  . cetirizine (ZYRTEC) 10 MG tablet Take 10 mg by mouth daily.    . diclofenac (CATAFLAM) 50 MG tablet Take 50 mg by mouth 2 (two) times daily at 10 AM and 5 PM.    . diclofenac sodium (VOLTAREN) 1 % GEL Apply 2 g topically 4 (four) times daily. 100 g 1  . furosemide (LASIX) 20 MG tablet Take 1 tablet (20 mg total) by mouth 2 (two) times daily. 60 tablet 3  . gabapentin (NEURONTIN) 300 MG capsule Take 2 capsules (600 mg total) by mouth 3 (three) times daily. 180 capsule 3  . ibuprofen (ADVIL,MOTRIN) 200 MG tablet Take 600-800 mg by mouth  every 8 (eight) hours as needed (for pain.).     . Insulin Pen Needle (BD PEN NEEDLE NANO U/F) 32G X 4 MM MISC 1 Package by Does not apply route daily at 12 noon. 100 each 0  . Liraglutide -Weight Management (SAXENDA) 18 MG/3ML SOPN Inject 3 mg into the skin daily. 5 pen 0  . ranitidine (ZANTAC) 150 MG tablet TAKE 1 TABLET BY MOUTH 2 TIMES DAILY. 60 tablet 0  . Vitamin D, Ergocalciferol, (DRISDOL) 50000 units CAPS capsule Take 1 capsule (50,000 Units total) by mouth every 7 (seven) days. 4 capsule 0   No current facility-administered medications on file prior to visit.     PAST MEDICAL HISTORY: Past Medical History:  Diagnosis Date  . Allergy   . Anxiety   .  Arthritis   . Asthma    since baby, seasonal  . ASTHMA, UNSPECIFIED 12/10/2006   Qualifier: Diagnosis of  By: Abundio Miu    . CELLULITIS AND ABSCESS OF LEG EXCEPT FOOT 12/30/2007   Qualifier: Diagnosis of  By: Daphine Deutscher FNP, Zena Amos    . Cyst of bone    right side of spine  . DEPENDENT EDEMA, LEGS, BILATERAL 11/22/2008   Qualifier: Diagnosis of  By: Barbaraann Barthel MD, [place]    . Depression 2003Dx  . Edema   . Food allergy    citris  . FOOT PAIN, RIGHT 11/22/2008   Qualifier: Diagnosis of  By: Barbaraann Barthel MD, [place]    . Gastric ulcer   . GERD (gastroesophageal reflux disease)   . Gout   . Hypertension    took Norvasc for 3 months, doctor discontinued no longer on BP medications  . Joint pain   . KNEE INJURY, LEFT 01/01/2009   Qualifier: Diagnosis of  By: Delrae Alfred MD, Lanora Manis    . Migraine   . ONYCHOMYCOSIS, TOENAILS 11/22/2008   Qualifier: Diagnosis of  By: Barbaraann Barthel MD, [place]    . Shortness of breath   . Sleep apnea   . TINEA PEDIS 11/22/2008   Qualifier: Diagnosis of  By: Barbaraann Barthel MD, Benetta Spar      PAST SURGICAL HISTORY: Past Surgical History:  Procedure Laterality Date  . DILITATION & CURRETTAGE/HYSTROSCOPY WITH NOVASURE ABLATION N/A 07/03/2017   Procedure: DILATATION & CURETTAGE/HYSTEROSCOPY WITH NOVASURE ABLATION;  Surgeon: Genia Del, MD;  Location: [facility];  Service: Gynecology;  Laterality: N/A;  . UPPER GI ENDOSCOPY    . WISDOM TOOTH EXTRACTION  2008   x4    SOCIAL HISTORY: Social History   Tobacco Use  . Smoking status: Former Smoker    Packs/day: 0.50    Years: 18.00    Pack years: 9.00    Types: Cigarettes    Start date: 10/13/1996    Last attempt to quit: 01/12/2015    Years since quitting: 2.7  . Smokeless tobacco: Never Used  Substance Use Topics  . Alcohol use: Yes    Alcohol/week: 0.6 oz    Types: 1 Standard drinks or equivalent per week    Comment: rarely drinks  . Drug use: No    Comment: marjuana - former use, quit 4 years ago     FAMILY HISTORY: Family History  Problem Relation Age of Onset  . Hypertension Mother   . Hyperlipidemia Mother   . Cancer Mother        bone cancer  . Heart disease Mother   . Sudden death Mother   . Stroke Mother   . Thyroid disease Mother   . Sleep apnea Mother   .  Obesity Mother   . Sudden death Father   . Hypertension Father   . Heart disease Father   . Hyperlipidemia Father   . Prostate cancer Father   . Lung cancer Father   . Obesity Father   . Alcoholism Father   . Sudden death Brother   . Hypertension Brother   . Hyperlipidemia Brother   . Heart attack Brother   . Heart disease Brother   . Stroke Brother   . Alcoholism Brother   . Drug abuse Brother   . Heart disease Sister   . Hypertension Sister   . Thyroid cancer Sister   . Diabetes Neg Hx   . Colon cancer Neg Hx   . Stomach cancer Neg Hx   . Esophageal cancer Neg Hx   . Rectal cancer Neg Hx   . Liver cancer Neg Hx     ROS: Review of Systems  Constitutional: Positive for weight loss.  Gastrointestinal: Negative for nausea.  Endo/Heme/Allergies:       Negative polyphagia Negative hypoglycemia  Psychiatric/Behavioral: Positive for depression. Negative for suicidal ideas.    PHYSICAL EXAM: Blood pressure 96/65, pulse 81, temperature 97.6 F (36.4 C), temperature source Oral, height 5\' 7"  (1.702 m), weight (!) 429 lb (194.6 kg), SpO2 97 %. Body mass index is 67.19 kg/m. Physical Exam  Constitutional: She is oriented to person, place, and time. She appears well-developed and well-nourished.  Cardiovascular: Normal rate.  Pulmonary/Chest: Effort normal.  Musculoskeletal: Normal range of motion.  Neurological: She is oriented to person, place, and time.  Skin: Skin is warm and dry.  Psychiatric: She has a normal mood and affect.  Vitals reviewed.   RECENT LABS AND TESTS: BMET    Component Value Date/Time   NA 141 07/21/2017 0925   K 4.2 07/21/2017 0925   CL 103 07/21/2017 0925   CO2  22 07/21/2017 0925   GLUCOSE 98 07/21/2017 0925   GLUCOSE 83 06/04/2016 1155   BUN 13 07/21/2017 0925   CREATININE 0.76 07/21/2017 0925   CREATININE 0.90 06/04/2016 1155   CALCIUM 8.9 07/21/2017 0925   GFRNONAA 102 07/21/2017 0925   GFRNONAA 84 06/04/2016 1155   GFRAA 118 07/21/2017 0925   GFRAA >89 06/04/2016 1155   Lab Results  Component Value Date   HGBA1C 5.4 06/25/2017   HGBA1C 5.2 05/19/2017   HGBA1C 5.4 01/20/2017   HGBA1C 5.6 10/27/2016   HGBA1C 5.0 11/21/2015   Lab Results  Component Value Date   INSULIN 30.4 (H) 06/25/2017   INSULIN 29.7 (H) 01/20/2017   INSULIN 30.1 (H) 10/27/2016   CBC    Component Value Date/Time   WBC 13.3 (H) 07/03/2017 0610   RBC 4.33 07/03/2017 0610   HGB 11.2 (L) 07/03/2017 0610   HGB 11.2 05/18/2017 1611   HCT 34.6 (L) 07/03/2017 0610   HCT 35.6 05/18/2017 1611   PLT 293 07/03/2017 0610   PLT 343 05/18/2017 1611   MCV 79.9 07/03/2017 0610   MCV 85 05/18/2017 1611   MCH 25.9 (L) 07/03/2017 0610   MCHC 32.4 07/03/2017 0610   RDW 14.7 07/03/2017 0610   RDW 14.0 05/18/2017 1611   LYMPHSABS 4.4 (H) 07/03/2017 0610   LYMPHSABS 2.9 01/20/2017 0831   MONOABS 0.6 07/03/2017 0610   EOSABS 0.1 07/03/2017 0610   EOSABS 0.1 01/20/2017 0831   BASOSABS 0.0 07/03/2017 0610   BASOSABS 0.0 01/20/2017 0831   Iron/TIBC/Ferritin/ %Sat    Component Value Date/Time   IRON 25 (L) 05/18/2017 1611  TIBC 363 05/18/2017 1611   FERRITIN 16 05/18/2017 1611   IRONPCTSAT 7 (LL) 05/18/2017 1611   Lipid Panel     Component Value Date/Time   CHOL 152 10/27/2016 1058   TRIG 57 10/27/2016 1058   HDL 43 10/27/2016 1058   CHOLHDL 3.6 11/21/2015 1047   VLDL 10 11/21/2015 1047   LDLCALC 98 10/27/2016 1058   Hepatic Function Panel     Component Value Date/Time   PROT 7.0 06/25/2017 1026   ALBUMIN 3.9 06/25/2017 1026   AST 39 06/25/2017 1026   ALT 57 (H) 06/25/2017 1026   ALKPHOS 61 06/25/2017 1026   BILITOT 0.3 06/25/2017 1026      Component  Value Date/Time   TSH 1.43 05/19/2017 1245   TSH 1.460 10/27/2016 1058   TSH 0.96 11/21/2015 1047    ASSESSMENT AND PLAN: Prediabetes  Other depression - with emotional eating  Class 3 severe obesity with serious comorbidity and body mass index (BMI) of 60.0 to 69.9 in adult, unspecified obesity type (HCC)  PLAN:  Pre-Diabetes Catherine [name] will continue to work on weight loss, exercise, and decreasing simple carbohydrates in her diet to help decrease the risk of diabetes. We dicussed metformin including benefits and risks. She was informed that eating too many simple carbohydrates or too many calories at one sitting increases the likelihood of GI side effects. Catherine [name] is advised to stop metformin, as she is now on Saxenda. Catherine [name] agrees to follow up with our clinic in 3 weeks as directed to monitor her progress.  Depression with Emotional Eating Behaviors We discussed behavior modification techniques today to help Catherine [name] deal with her emotional eating and depression. Catherine [name] agrees to discontinue Wellbutrin and she will follow up with our clinic in 3 weeks.  We spent > than 50% of the 15 minute visit on the counseling as documented in the note.  Obesity Catherine [name] is currently in the action stage of change. As such, her goal is to continue with weight loss efforts She has agreed to keep a food journal with 1600 calories and 90 grams of protein daily Catherine [name] has been instructed to work up to a goal of 150 minutes of combined cardio and strengthening exercise per week for weight loss and overall health benefits. We discussed the following Behavioral Modification Strategies today: increasing lean protein intake and keeping healthy foods in the home We discussed various medication options to help Catherine [name] with her weight loss efforts and we both agreed to continue [place]  Catherine [name] has agreed to follow up with our clinic in 3 weeks. She was informed of the importance of frequent follow up visits  to maximize her success with intensive lifestyle modifications for her multiple health conditions.   OBESITY BEHAVIORAL INTERVENTION VISIT  Today's visit was # 11 out of 22.  Starting weight: 426 lbs Starting date: 04/23/17 Today's weight : 429 lbs  Today's date: 10/22/2017 Total lbs lost to date: 3939 (Patients must lose 7 lbs in the first 6 months to continue with counseling)   ASK: We discussed the diagnosis of obesity with Catherine [name] today and Catherine [name] agreed to give us permission to discuss obesity behavioral modification therapy today.  ASSESS: Catherine [name] has the diagnosis of obesity and her BMI today is 67.18 Catherine [name] is in the action stage of change   ADVISE: Catherine [name] was educated on the multiple health risks of obesity as well as the benefit of weight loss to improve her health. She was advised of the need for long term  treatment and the importance of lifestyle modifications.  AGREE: Multiple dietary modification options and treatment options were discussed and  Catherine [name] agreed to the above obesity treatment plan.  Catherine [name], am acting as transcriptionist for Illa Level, PA-C I Illa Level [facility] have reviewed this note and agree with its contents  I have reviewed the above documentation for accuracy and completeness, and I agree with the above. -Quillian Quince, MD

## 2017-10-27 ENCOUNTER — Other Ambulatory Visit: Payer: Self-pay | Admitting: Internal Medicine

## 2017-10-27 MED FILL — VIT D2 1.25 MG (50,000 UNIT: 1.25 MG | 28 days supply | Qty: 4 | Fill #0

## 2017-10-28 ENCOUNTER — Ambulatory Visit (INDEPENDENT_AMBULATORY_CARE_PROVIDER_SITE_OTHER): Payer: 59 | Admitting: Internal Medicine

## 2017-10-28 ENCOUNTER — Other Ambulatory Visit: Payer: Self-pay

## 2017-10-28 ENCOUNTER — Encounter: Payer: Self-pay | Admitting: Internal Medicine

## 2017-10-28 VITALS — BP 98/64 | HR 86 | Temp 97.9°F | Ht 67.0 in | Wt >= 6400 oz

## 2017-10-28 DIAGNOSIS — I959 Hypotension, unspecified: Secondary | ICD-10-CM | POA: Diagnosis not present

## 2017-10-28 NOTE — Patient Instructions (Signed)
Decrease your lasix to 20 mg once per day from twice per day. We will see if that helps with your blood pressure. Keep wearing the compression stockings and move your legs as much as possible to help with edema. If you do need to sit, keep your legs elevated.

## 2017-10-28 NOTE — Progress Notes (Signed)
   Subjective:    Catherine IlesJansala N Robleto - 37 y.o. female MRN [id number]  Date of birth: 01/16/1981  HPI  Catherine [name] is here for concerns about hypotenstion. Patient is followed at [facility] and provider there was concerned about recent low BPs. Patient has been taking Lasix 20 mg BID for peripheral edema. This was increased from 20 mg daily in October. Patient reports good improvement in edema. She is wearing her compression stockings at today's visit. She denies lightheadedness, dizziness, vision changes, sensation of passing out or recent falls.   -  reports that she quit smoking about 2 years ago. Her smoking use included cigarettes. She started smoking about 21 years ago. She has a 9.00 pack-year smoking history. she has never used smokeless tobacco. - Review of Systems: Per HPI. - Past Medical History: Patient Active Problem List   Diagnosis Date Noted  . Class 3 obesity with serious comorbidity and body mass index (BMI) of 60.0 to 69.9 in adult 06/10/2017  . Fluid retention 06/10/2017  . Menorrhagia 05/18/2017  . Sacral radiculopathy 02/04/2017  . Insulin resistance 02/02/2017  . At risk for violence to self related to depression 02/02/2017  . Morbid obesity (HCC) 01/01/2017  . Sore throat (viral) 11/21/2016  . Vitamin D deficiency 11/12/2016  . Prediabetes 11/12/2016  . Asthma 07/25/2016  . Abdominal pain 06/04/2016  . Otitis, externa, infective 06/04/2016  . Dysuria 03/20/2016  . Gout 11/21/2015  . Obesity, morbid, BMI 50 or higher (HCC) 05/14/2015  . BIPOLAR DISORDER UNSPECIFIED 11/22/2008  . Depression 12/10/2006  . MIGRAINE, UNSPEC., W/O INTRACTABLE MIGRAINE 12/10/2006  . GASTROESOPHAGEAL REFLUX, NO ESOPHAGITIS 12/10/2006  . OSA (obstructive sleep apnea) 12/10/2006   - Medications: reviewed and updated   Objective:   Physical Exam BP 98/64   Pulse 86   Temp 97.9 F (36.6 C) (Oral)   Ht 5\' 7"  (1.702 m)   Wt (!) 440 lb (199.6 kg)   SpO2 99%    BMI 68.91 kg/m  Gen: NAD, alert, cooperative with exam, well-appearing CV: RRR, good S1/S2, no murmur, 1+ peripheral edema, compression stockings in place  Resp: CTABL, no wheezes, non-labored    Assessment & Plan:   1. Hypotension, unspecified hypotension type Doubt this is completely related to Lasix as patient has had several low BPs documented in the past prior to initiation of medication and loop diuretics typically do not lower BP significantly. Reassuringly, patient is asymptomatic with low pressures. Will decrease to Lasix 20 mg daily. Ideally would like to treat peripheral edema with weight loss and discussed that this is the best long term solution as loop diuretics typically do not have great utility in management of edema from venous stasis and obesity. Recommended moving calf muscles as much as possible, elevation of feet, and continued use of compression stockings.    Marcy Sirenatherine Goran Olden, D.O. 10/28/2017, 4:31 PM PGY-3, [facility]

## 2017-11-12 ENCOUNTER — Ambulatory Visit (INDEPENDENT_AMBULATORY_CARE_PROVIDER_SITE_OTHER): Payer: 59 | Admitting: Physician Assistant

## 2017-11-12 VITALS — BP 127/71 | HR 102 | Temp 97.8°F | Ht 67.0 in | Wt >= 6400 oz

## 2017-11-12 DIAGNOSIS — Z6841 Body Mass Index (BMI) 40.0 and over, adult: Secondary | ICD-10-CM | POA: Diagnosis not present

## 2017-11-12 DIAGNOSIS — I10 Essential (primary) hypertension: Secondary | ICD-10-CM | POA: Diagnosis not present

## 2017-11-12 NOTE — Progress Notes (Signed)
Office: [phone]  /  Fax: [phone]   HPI:   Chief Complaint: OBESITY Catherine [name] is here to discuss her progress with her obesity treatment plan. She is on the keep a food journal with 1600 calories and 90 grams of protein daily and is following her eating plan approximately 85 to 90 % of the time. She states she is exercising 0 minutes 0 times per week. Catherine [name] continues to do well with weight loss. She is mindful of her eating and states her hunger is well controlled. Her weight is (!) 427 lb (193.7 kg) today and has had a weight loss of 2 pounds over a period of 3 weeks since her last visit. She has lost 41 lbs since starting treatment with [name].  Hypertension Catherine [name] is a 37 y.o. female with hypertension. Her blood pressure has been low in the past. She was advised to follow up with her PCP, and lasix has been decreased to 20 mg. Catherine [name] denies any ankle swelling, chest pain or shortness of breath on exertion. She is working weight loss to help control her blood pressure with the goal of decreasing her risk of heart attack and stroke. Catherine [name] blood pressure is stable today.   ALLERGIES: Allergies  Allergen Reactions  . Citrus Swelling    MEDICATIONS: Current Outpatient Medications on File Prior to Visit  Medication Sig Dispense Refill  . aspirin-acetaminophen-caffeine (EXCEDRIN MIGRAINE) 250-250-65 MG per tablet Take 2 tablets by mouth 2 (two) times daily as needed for migraine. For headache     . budesonide (PULMICORT) 180 MCG/ACT inhaler Inhale 1 puff into the lungs 2 (two) times daily. 1 Inhaler 1  . cetirizine (ZYRTEC) 10 MG tablet Take 10 mg by mouth daily.    . diclofenac (CATAFLAM) 50 MG tablet Take 50 mg by mouth 2 (two) times daily at 10 AM and 5 PM.    . diclofenac sodium (VOLTAREN) 1 % GEL Apply 2 g topically 4 (four) times daily. 100 g 1  . furosemide (LASIX) 20 MG tablet Take 1 tablet (20 mg total) by mouth 2 (two) times daily. 60 tablet 3  .  gabapentin (NEURONTIN) 300 MG capsule Take 2 capsules (600 mg total) by mouth 3 (three) times daily. 180 capsule 3  . ibuprofen (ADVIL,MOTRIN) 200 MG tablet Take 600-800 mg by mouth every 8 (eight) hours as needed (for pain.).     . Insulin Pen Needle (BD PEN NEEDLE NANO U/F) 32G X 4 MM MISC 1 Package by Does not apply route daily at 12 noon. 100 each 0  . Liraglutide -Weight Management (SAXENDA) 18 MG/3ML SOPN Inject 3 mg into the skin daily. 5 pen 0  . ranitidine (ZANTAC) 150 MG tablet TAKE 1 TABLET BY MOUTH 2 TIMES DAILY. 60 tablet 0  . Vitamin D, Ergocalciferol, (DRISDOL) 50000 units CAPS capsule Take 1 capsule (50,000 Units total) by mouth every 7 (seven) days. 4 capsule 0   No current facility-administered medications on file prior to visit.     PAST MEDICAL HISTORY: Past Medical History:  Diagnosis Date  . Allergy   . Anxiety   . Arthritis   . Asthma    since baby, seasonal  . ASTHMA, UNSPECIFIED 12/10/2006   Qualifier: Diagnosis of  By: Abundio Miu    . CELLULITIS AND ABSCESS OF LEG EXCEPT FOOT 12/30/2007   Qualifier: Diagnosis of  By: Daphine Deutscher FNP, Zena Amos    . Cyst of bone    right side of spine  .  DEPENDENT EDEMA, LEGS, BILATERAL 11/22/2008   Qualifier: Diagnosis of  By: Barbaraann Barthel MD, [place]    . Depression 2003Dx  . Edema   . Food allergy    citris  . FOOT PAIN, RIGHT 11/22/2008   Qualifier: Diagnosis of  By: Barbaraann Barthel MD, [place]    . Gastric ulcer   . GERD (gastroesophageal reflux disease)   . Gout   . Hypertension    took Norvasc for 3 months, doctor discontinued no longer on BP medications  . Joint pain   . KNEE INJURY, LEFT 01/01/2009   Qualifier: Diagnosis of  By: Delrae Alfred MD, Lanora Manis    . Migraine   . ONYCHOMYCOSIS, TOENAILS 11/22/2008   Qualifier: Diagnosis of  By: Barbaraann Barthel MD, [place]    . Shortness of breath   . Sleep apnea   . TINEA PEDIS 11/22/2008   Qualifier: Diagnosis of  By: Barbaraann Barthel MD, Benetta Spar      PAST SURGICAL HISTORY: Past Surgical  History:  Procedure Laterality Date  . DILITATION & CURRETTAGE/HYSTROSCOPY WITH NOVASURE ABLATION N/A 07/03/2017   Procedure: DILATATION & CURETTAGE/HYSTEROSCOPY WITH NOVASURE ABLATION;  Surgeon: Genia Del, MD;  Location: [facility];  Service: Gynecology;  Laterality: N/A;  . UPPER GI ENDOSCOPY    . WISDOM TOOTH EXTRACTION  2008   x4    SOCIAL HISTORY: Social History   Tobacco Use  . Smoking status: Former Smoker    Packs/day: 0.50    Years: 18.00    Pack years: 9.00    Types: Cigarettes    Start date: 10/13/1996    Last attempt to quit: 01/12/2015    Years since quitting: 2.8  . Smokeless tobacco: Never Used  Substance Use Topics  . Alcohol use: Yes    Alcohol/week: 0.6 oz    Types: 1 Standard drinks or equivalent per week    Comment: rarely drinks  . Drug use: No    Comment: marjuana - former use, quit 4 years ago    FAMILY HISTORY: Family History  Problem Relation Age of Onset  . Hypertension Mother   . Hyperlipidemia Mother   . Cancer Mother        bone cancer  . Heart disease Mother   . Sudden death Mother   . Stroke Mother   . Thyroid disease Mother   . Sleep apnea Mother   . Obesity Mother   . Sudden death Father   . Hypertension Father   . Heart disease Father   . Hyperlipidemia Father   . Prostate cancer Father   . Lung cancer Father   . Obesity Father   . Alcoholism Father   . Sudden death Brother   . Hypertension Brother   . Hyperlipidemia Brother   . Heart attack Brother   . Heart disease Brother   . Stroke Brother   . Alcoholism Brother   . Drug abuse Brother   . Heart disease Sister   . Hypertension Sister   . Thyroid cancer Sister   . Diabetes Neg Hx   . Colon cancer Neg Hx   . Stomach cancer Neg Hx   . Esophageal cancer Neg Hx   . Rectal cancer Neg Hx   . Liver cancer Neg Hx     ROS: Review of Systems  Constitutional: Positive for weight loss.  Respiratory: Negative for shortness of breath (on exertion).   Cardiovascular:  Negative for chest pain.  Musculoskeletal:       Negative ankle swelling    PHYSICAL EXAM: Blood pressure  127/71, pulse (!) 102, temperature 97.8 F (36.6 C), temperature source Oral, height 5\' 7"  (1.702 m), weight (!) 427 lb (193.7 kg), SpO2 99 %. Body mass index is 66.88 kg/m. Physical Exam  Constitutional: She is oriented to person, place, and time. She appears well-developed and well-nourished.  Cardiovascular: Tachycardia present.  Pulmonary/Chest: Effort normal.  Musculoskeletal: Normal range of motion.  Neurological: She is oriented to person, place, and time.  Skin: Skin is warm and dry.  Psychiatric: She has a normal mood and affect. Her behavior is normal.  Vitals reviewed.   RECENT LABS AND TESTS: BMET    Component Value Date/Time   NA 141 07/21/2017 0925   K 4.2 07/21/2017 0925   CL 103 07/21/2017 0925   CO2 22 07/21/2017 0925   GLUCOSE 98 07/21/2017 0925   GLUCOSE 83 06/04/2016 1155   BUN 13 07/21/2017 0925   CREATININE 0.76 07/21/2017 0925   CREATININE 0.90 06/04/2016 1155   CALCIUM 8.9 07/21/2017 0925   GFRNONAA 102 07/21/2017 0925   GFRNONAA 84 06/04/2016 1155   GFRAA 118 07/21/2017 0925   GFRAA >89 06/04/2016 1155   Lab Results  Component Value Date   HGBA1C 5.4 06/25/2017   HGBA1C 5.2 05/19/2017   HGBA1C 5.4 01/20/2017   HGBA1C 5.6 10/27/2016   HGBA1C 5.0 11/21/2015   Lab Results  Component Value Date   INSULIN 30.4 (H) 06/25/2017   INSULIN 29.7 (H) 01/20/2017   INSULIN 30.1 (H) 10/27/2016   CBC    Component Value Date/Time   WBC 13.3 (H) 07/03/2017 0610   RBC 4.33 07/03/2017 0610   HGB 11.2 (L) 07/03/2017 0610   HGB 11.2 05/18/2017 1611   HCT 34.6 (L) 07/03/2017 0610   HCT 35.6 05/18/2017 1611   PLT 293 07/03/2017 0610   PLT 343 05/18/2017 1611   MCV 79.9 07/03/2017 0610   MCV 85 05/18/2017 1611   MCH 25.9 (L) 07/03/2017 0610   MCHC 32.4 07/03/2017 0610   RDW 14.7 07/03/2017 0610   RDW 14.0 05/18/2017 1611   LYMPHSABS 4.4 (H)  07/03/2017 0610   LYMPHSABS 2.9 01/20/2017 0831   MONOABS 0.6 07/03/2017 0610   EOSABS 0.1 07/03/2017 0610   EOSABS 0.1 01/20/2017 0831   BASOSABS 0.0 07/03/2017 0610   BASOSABS 0.0 01/20/2017 0831   Iron/TIBC/Ferritin/ %Sat    Component Value Date/Time   IRON 25 (L) 05/18/2017 1611   TIBC 363 05/18/2017 1611   FERRITIN 16 05/18/2017 1611   IRONPCTSAT 7 (LL) 05/18/2017 1611   Lipid Panel     Component Value Date/Time   CHOL 152 10/27/2016 1058   TRIG 57 10/27/2016 1058   HDL 43 10/27/2016 1058   CHOLHDL 3.6 11/21/2015 1047   VLDL 10 11/21/2015 1047   LDLCALC 98 10/27/2016 1058   Hepatic Function Panel     Component Value Date/Time   PROT 7.0 06/25/2017 1026   ALBUMIN 3.9 06/25/2017 1026   AST 39 06/25/2017 1026   ALT 57 (H) 06/25/2017 1026   ALKPHOS 61 06/25/2017 1026   BILITOT 0.3 06/25/2017 1026      Component Value Date/Time   TSH 1.43 05/19/2017 1245   TSH 1.460 10/27/2016 1058   TSH 0.96 11/21/2015 1047    ASSESSMENT AND PLAN: Essential hypertension  Class 3 severe obesity with serious comorbidity and body mass index (BMI) of 60.0 to 69.9 in adult, unspecified obesity type (HCC)  PLAN:  Hypertension We discussed sodium restriction, working on healthy weight loss, and a regular exercise program  as the means to achieve improved blood pressure control. Renalda agreed with this plan and agreed to follow up as directed. We will continue to monitor her blood pressure as well as her progress with the above lifestyle modifications. She will continue her medications as prescribed and will watch for signs of hypotension as she continues her lifestyle modifications.  We spent > than 50% of the 15 minute visit on the counseling as documented in the note.  Obesity Shyna is currently in the action stage of change. As such, her goal is to continue with weight loss efforts She has agreed to keep a food journal with 500 calories and 40 grams of protein at supper daily  and follow the Category 3 plan Ivorie has been instructed to work up to a goal of 150 minutes of combined cardio and strengthening exercise per week for weight loss and overall health benefits. We discussed the following Behavioral Modification Strategies today: increasing lean protein intake and work on meal planning and easy cooking plans  Ramina has agreed to follow up with our clinic in 2 weeks. She was informed of the importance of frequent follow up visits to maximize her success with intensive lifestyle modifications for her multiple health conditions.  Cristi Loron, am acting as transcriptionist for [employer], PA-C I, [place] Level [facility], have reviewed this note and agree with its content.

## 2017-11-13 ENCOUNTER — Other Ambulatory Visit (INDEPENDENT_AMBULATORY_CARE_PROVIDER_SITE_OTHER): Payer: Self-pay | Admitting: Physician Assistant

## 2017-11-13 DIAGNOSIS — Z6841 Body Mass Index (BMI) 40.0 and over, adult: Principal | ICD-10-CM

## 2017-11-13 MED FILL — raNITIdine HCL 150 MG TABS: 150 | 30 days supply | Qty: 60 | Fill #0

## 2017-11-18 MED FILL — SAXENDA 18 MG/3 ML PEN: 18 | 30 days supply | Qty: 15 | Fill #0

## 2017-11-19 MED FILL — FUROSEMIDE 20 MG TABS: 20 | 30 days supply | Qty: 60 | Fill #2

## 2017-11-19 MED FILL — GABAPENTIN 300 MG CAPSULE: 300 | 30 days supply | Qty: 180 | Fill #3

## 2017-11-20 DIAGNOSIS — N390 Urinary tract infection, site not specified: Secondary | ICD-10-CM | POA: Diagnosis not present

## 2017-11-25 MED FILL — PHENAZOPYRIDINE 200 MG TAB: 200 | 2 days supply | Qty: 6 | Fill #0

## 2017-11-25 MED FILL — CIPROFLOXACIN HCL 500 MG TA: 500 | 5 days supply | Qty: 10 | Fill #0

## 2017-11-26 ENCOUNTER — Ambulatory Visit (INDEPENDENT_AMBULATORY_CARE_PROVIDER_SITE_OTHER): Payer: 59 | Admitting: Physician Assistant

## 2017-11-26 VITALS — BP 121/77 | HR 97 | Temp 97.7°F | Ht 67.0 in | Wt >= 6400 oz

## 2017-11-26 DIAGNOSIS — E559 Vitamin D deficiency, unspecified: Secondary | ICD-10-CM | POA: Diagnosis not present

## 2017-11-26 DIAGNOSIS — Z6841 Body Mass Index (BMI) 40.0 and over, adult: Secondary | ICD-10-CM | POA: Diagnosis not present

## 2017-11-26 DIAGNOSIS — R7303 Prediabetes: Secondary | ICD-10-CM

## 2017-11-26 DIAGNOSIS — Z9189 Other specified personal risk factors, not elsewhere classified: Secondary | ICD-10-CM

## 2017-11-26 MED ORDER — VITAMIN D (ERGOCALCIFEROL) 1.25 MG (50000 UNIT) PO CAPS: 50000.0000 [IU] | ORAL_CAPSULE | ORAL | 0 refills | Status: DC

## 2017-11-26 NOTE — Progress Notes (Signed)
Office: [phone]  /  Fax: [phone]   HPI:   Chief Complaint: OBESITY Catherine [name] is here to discuss her progress with her obesity treatment plan. She is on the keep a food journal with 500 calories and 40 grams of protein at supper daily and follow the Category 3 plan and is following her eating plan approximately 80 % of the time. She states she is exercising 0 minutes 0 times per week. Catherine [name] continues to do well with weight loss. She had celebration eating with her birthday, but has since been back on track. Her weight is (!) 424 lb (192.3 kg) today and has had a weight loss of 3 pounds over a period of 2 weeks since her last visit. She has lost 44 lbs since starting treatment with us.  Vitamin D deficiency Catherine [name] has a diagnosis of vitamin D deficiency. She is currently taking vit D and denies nausea, vomiting or muscle weakness.   Ref. Range 06/25/2017 10:26  Vitamin D, 25-Hydroxy Latest Ref Range: 30.0 - 100.0 ng/mL 35.7   At risk for osteopenia and osteoporosis Catherine [name] is at higher risk of osteopenia and osteoporosis due to vitamin D deficiency.   Pre-Diabetes Catherine [name] has a diagnosis of pre-diabetes based on her elevated Hgb A1c and was informed this puts her at greater risk of developing diabetes. She is on Saxenda currently and continues to work on diet and exercise to decrease risk of diabetes. She denies nausea, polyphagia or hypoglycemia.  ALLERGIES: Allergies  Allergen Reactions  . Citrus Swelling    MEDICATIONS: Current Outpatient Medications on File Prior to Visit  Medication Sig Dispense Refill  . aspirin-acetaminophen-caffeine (EXCEDRIN MIGRAINE) 250-250-65 MG per tablet Take 2 tablets by mouth 2 (two) times daily as needed for migraine. For headache     . budesonide (PULMICORT) 180 MCG/ACT inhaler Inhale 1 puff into the lungs 2 (two) times daily. 1 Inhaler 1  . cetirizine (ZYRTEC) 10 MG tablet Take 10 mg by mouth daily.    . diclofenac (CATAFLAM) 50 MG  tablet Take 50 mg by mouth 2 (two) times daily at 10 AM and 5 PM.    . diclofenac sodium (VOLTAREN) 1 % GEL Apply 2 g topically 4 (four) times daily. 100 g 1  . furosemide (LASIX) 20 MG tablet Take 1 tablet (20 mg total) by mouth 2 (two) times daily. 60 tablet 3  . gabapentin (NEURONTIN) 300 MG capsule Take 2 capsules (600 mg total) by mouth 3 (three) times daily. 180 capsule 3  . ibuprofen (ADVIL,MOTRIN) 200 MG tablet Take 600-800 mg by mouth every 8 (eight) hours as needed (for pain.).     . Insulin Pen Needle (BD PEN NEEDLE NANO U/F) 32G X 4 MM MISC 1 Package by Does not apply route daily at 12 noon. 100 each 0  . ranitidine (ZANTAC) 150 MG tablet TAKE 1 TABLET BY MOUTH 2 TIMES DAILY. 60 tablet 0  . SAXENDA 18 MG/3ML SOPN INJECT 3 MG INTO THE SKIN DAILY. 15 mL 0   No current facility-administered medications on file prior to visit.     PAST MEDICAL HISTORY: Past Medical History:  Diagnosis Date  . Allergy   . Anxiety   . Arthritis   . Asthma    since baby, seasonal  . ASTHMA, UNSPECIFIED 12/10/2006   Qualifier: Diagnosis of  By: Abundio MiuMcGregor, Barbara    . CELLULITIS AND ABSCESS OF LEG EXCEPT FOOT 12/30/2007   Qualifier: Diagnosis of  By: Daphine DeutscherMartin FNP, Zena AmosNykedtra    .  Cyst of bone    right side of spine  . DEPENDENT EDEMA, LEGS, BILATERAL 11/22/2008   Qualifier: Diagnosis of  By: Barbaraann Barthel MD, [place]    . Depression 2003Dx  . Edema   . Food allergy    citris  . FOOT PAIN, RIGHT 11/22/2008   Qualifier: Diagnosis of  By: Barbaraann Barthel MD, [place]    . Gastric ulcer   . GERD (gastroesophageal reflux disease)   . Gout   . Hypertension    took Norvasc for 3 months, doctor discontinued no longer on BP medications  . Joint pain   . KNEE INJURY, LEFT 01/01/2009   Qualifier: Diagnosis of  By: Delrae Alfred MD, Lanora Manis    . Migraine   . ONYCHOMYCOSIS, TOENAILS 11/22/2008   Qualifier: Diagnosis of  By: Barbaraann Barthel MD, [place]    . Shortness of breath   . Sleep apnea   . TINEA PEDIS 11/22/2008    Qualifier: Diagnosis of  By: Barbaraann Barthel MD, Benetta Spar      PAST SURGICAL HISTORY: Past Surgical History:  Procedure Laterality Date  . DILITATION & CURRETTAGE/HYSTROSCOPY WITH NOVASURE ABLATION N/A 07/03/2017   Procedure: DILATATION & CURETTAGE/HYSTEROSCOPY WITH NOVASURE ABLATION;  Surgeon: Genia Del, MD;  Location: [facility];  Service: Gynecology;  Laterality: N/A;  . UPPER GI ENDOSCOPY    . WISDOM TOOTH EXTRACTION  2008   x4    SOCIAL HISTORY: Social History   Tobacco Use  . Smoking status: Former Smoker    Packs/day: 0.50    Years: 18.00    Pack years: 9.00    Types: Cigarettes    Start date: 10/13/1996    Last attempt to quit: 01/12/2015    Years since quitting: 2.8  . Smokeless tobacco: Never Used  Substance Use Topics  . Alcohol use: Yes    Alcohol/week: 0.6 oz    Types: 1 Standard drinks or equivalent per week    Comment: rarely drinks  . Drug use: No    Comment: marjuana - former use, quit 4 years ago    FAMILY HISTORY: Family History  Problem Relation Age of Onset  . Hypertension Mother   . Hyperlipidemia Mother   . Cancer Mother        bone cancer  . Heart disease Mother   . Sudden death Mother   . Stroke Mother   . Thyroid disease Mother   . Sleep apnea Mother   . Obesity Mother   . Sudden death Father   . Hypertension Father   . Heart disease Father   . Hyperlipidemia Father   . Prostate cancer Father   . Lung cancer Father   . Obesity Father   . Alcoholism Father   . Sudden death Brother   . Hypertension Brother   . Hyperlipidemia Brother   . Heart attack Brother   . Heart disease Brother   . Stroke Brother   . Alcoholism Brother   . Drug abuse Brother   . Heart disease Sister   . Hypertension Sister   . Thyroid cancer Sister   . Diabetes Neg Hx   . Colon cancer Neg Hx   . Stomach cancer Neg Hx   . Esophageal cancer Neg Hx   . Rectal cancer Neg Hx   . Liver cancer Neg Hx     ROS: Review of Systems  Constitutional: Positive for  weight loss.  Gastrointestinal: Negative for nausea and vomiting.  Musculoskeletal:       Negative for muscle weakness  Endo/Heme/Allergies:  Negative for polyphagia Negative for hypoglycemia     PHYSICAL EXAM: Blood pressure 121/77, pulse 97, temperature 97.7 F (36.5 C), temperature source Oral, height 5\' 7"  (1.702 m), weight (!) 424 lb (192.3 kg), SpO2 98 %. Body mass index is 66.41 kg/m. Physical Exam  Constitutional: She is oriented to person, place, and time. She appears well-developed and well-nourished.  Cardiovascular: Normal rate.  Pulmonary/Chest: Effort normal.  Musculoskeletal: Normal range of motion.  Neurological: She is oriented to person, place, and time.  Skin: Skin is warm and dry.  Psychiatric: She has a normal mood and affect. Her behavior is normal.  Vitals reviewed.   RECENT LABS AND TESTS: BMET    Component Value Date/Time   NA 141 07/21/2017 0925   K 4.2 07/21/2017 0925   CL 103 07/21/2017 0925   CO2 22 07/21/2017 0925   GLUCOSE 98 07/21/2017 0925   GLUCOSE 83 06/04/2016 1155   BUN 13 07/21/2017 0925   CREATININE 0.76 07/21/2017 0925   CREATININE 0.90 06/04/2016 1155   CALCIUM 8.9 07/21/2017 0925   GFRNONAA 102 07/21/2017 0925   GFRNONAA 84 06/04/2016 1155   GFRAA 118 07/21/2017 0925   GFRAA >89 06/04/2016 1155   Lab Results  Component Value Date   HGBA1C 5.4 06/25/2017   HGBA1C 5.2 05/19/2017   HGBA1C 5.4 01/20/2017   HGBA1C 5.6 10/27/2016   HGBA1C 5.0 11/21/2015   Lab Results  Component Value Date   INSULIN 30.4 (H) 06/25/2017   INSULIN 29.7 (H) 01/20/2017   INSULIN 30.1 (H) 10/27/2016   CBC    Component Value Date/Time   WBC 13.3 (H) 07/03/2017 0610   RBC 4.33 07/03/2017 0610   HGB 11.2 (L) 07/03/2017 0610   HGB 11.2 05/18/2017 1611   HCT 34.6 (L) 07/03/2017 0610   HCT 35.6 05/18/2017 1611   PLT 293 07/03/2017 0610   PLT 343 05/18/2017 1611   MCV 79.9 07/03/2017 0610   MCV 85 05/18/2017 1611   MCH 25.9 (L)  07/03/2017 0610   MCHC 32.4 07/03/2017 0610   RDW 14.7 07/03/2017 0610   RDW 14.0 05/18/2017 1611   LYMPHSABS 4.4 (H) 07/03/2017 0610   LYMPHSABS 2.9 01/20/2017 0831   MONOABS 0.6 07/03/2017 0610   EOSABS 0.1 07/03/2017 0610   EOSABS 0.1 01/20/2017 0831   BASOSABS 0.0 07/03/2017 0610   BASOSABS 0.0 01/20/2017 0831   Iron/TIBC/Ferritin/ %Sat    Component Value Date/Time   IRON 25 (L) 05/18/2017 1611   TIBC 363 05/18/2017 1611   FERRITIN 16 05/18/2017 1611   IRONPCTSAT 7 (LL) 05/18/2017 1611   Lipid Panel     Component Value Date/Time   CHOL 152 10/27/2016 1058   TRIG 57 10/27/2016 1058   HDL 43 10/27/2016 1058   CHOLHDL 3.6 11/21/2015 1047   VLDL 10 11/21/2015 1047   LDLCALC 98 10/27/2016 1058   Hepatic Function Panel     Component Value Date/Time   PROT 7.0 06/25/2017 1026   ALBUMIN 3.9 06/25/2017 1026   AST 39 06/25/2017 1026   ALT 57 (H) 06/25/2017 1026   ALKPHOS 61 06/25/2017 1026   BILITOT 0.3 06/25/2017 1026      Component Value Date/Time   TSH 1.43 05/19/2017 1245   TSH 1.460 10/27/2016 1058   TSH 0.96 11/21/2015 1047     Ref. Range 06/25/2017 10:26  Vitamin D, 25-Hydroxy Latest Ref Range: 30.0 - 100.0 ng/mL 35.7   ASSESSMENT AND PLAN: Vitamin D deficiency - Plan: Vitamin D, Ergocalciferol, (DRISDOL) 50000 units  CAPS capsule  Prediabetes  At risk for osteoporosis  Class 3 severe obesity with serious comorbidity and body mass index (BMI) of 60.0 to 69.9 in adult, unspecified obesity type (HCC)  PLAN:  Vitamin D Deficiency Flavia was informed that low vitamin D levels contributes to fatigue and are associated with obesity, breast, and colon cancer. She agrees to continue to take prescription Vit D @50 ,000 IU every week #4 with no refills and will follow up for routine testing of vitamin D, at least 2-3 times per year. She was informed of the risk of over-replacement of vitamin D and agrees to not increase her dose unless she discusses this with [name]  first. Amit agrees to follow up with our clinic in 2 weeks.  At risk for osteopenia and osteoporosis Carlyne is at risk for osteopenia and osteoporosis due to her vitamin D deficiency. She was encouraged to take her vitamin D and follow her higher calcium diet and increase strengthening exercise to help strengthen her bones and decrease her risk of osteopenia and osteoporosis.  Pre-Diabetes Rashaunda will continue to work on weight loss, exercise, and decreasing simple carbohydrates in her diet to help decrease the risk of diabetes.  She was informed that eating too many simple carbohydrates or too many calories at one sitting increases the likelihood of GI side effects.  Faria agreed to follow up with [name] as directed to monitor her progress.  Obesity Rabia is currently in the action stage of change. As such, her goal is to continue with weight loss efforts She has agreed to follow the Category 3 plan Demeshia has been instructed to work up to a goal of 150 minutes of combined cardio and strengthening exercise per week for weight loss and overall health benefits. We discussed the following Behavioral Modification Strategies today: increasing lean protein intake and work on meal planning and easy cooking plans  Mesa has agreed to follow up with our clinic in 2 weeks. She was informed of the importance of frequent follow up visits to maximize her success with intensive lifestyle modifications for her multiple health conditions.   Cristi Loron, am acting as transcriptionist for [employer], PA-C I, Ill[employer] Level [facility], have reviewed this note and agree with its content.

## 2017-12-01 ENCOUNTER — Ambulatory Visit: Payer: 59 | Admitting: Internal Medicine

## 2017-12-10 ENCOUNTER — Ambulatory Visit (INDEPENDENT_AMBULATORY_CARE_PROVIDER_SITE_OTHER): Payer: 59 | Admitting: Physician Assistant

## 2017-12-10 VITALS — BP 126/85 | HR 99 | Temp 97.9°F | Ht 67.0 in | Wt >= 6400 oz

## 2017-12-10 DIAGNOSIS — E559 Vitamin D deficiency, unspecified: Secondary | ICD-10-CM | POA: Diagnosis not present

## 2017-12-10 DIAGNOSIS — Z6841 Body Mass Index (BMI) 40.0 and over, adult: Secondary | ICD-10-CM | POA: Diagnosis not present

## 2017-12-10 DIAGNOSIS — Z9189 Other specified personal risk factors, not elsewhere classified: Secondary | ICD-10-CM | POA: Diagnosis not present

## 2017-12-10 DIAGNOSIS — E8881 Metabolic syndrome: Secondary | ICD-10-CM | POA: Diagnosis not present

## 2017-12-10 DIAGNOSIS — Z862 Personal history of diseases of the blood and blood-forming organs and certain disorders involving the immune mechanism: Secondary | ICD-10-CM | POA: Diagnosis not present

## 2017-12-10 MED ORDER — VITAMIN D (ERGOCALCIFEROL) 1.25 MG (50000 UNIT) PO CAPS: 50000.0000 [IU] | ORAL_CAPSULE | ORAL | 0 refills | Status: DC

## 2017-12-10 MED FILL — VIT D2 1.25 MG (50,000 UNIT: 1.25 MG | 28 days supply | Qty: 4 | Fill #0

## 2017-12-10 NOTE — Progress Notes (Signed)
Office: [phone]  /  Fax: [phone]   HPI:   Chief Complaint: OBESITY Catherine [name] is here to discuss her progress with her obesity treatment plan. She is on the Category 3 plan and is following her eating plan approximately 87 % of the time. She states she is exercising 0 minutes 0 times per week. Catherine [name] continues to do well with weight loss. She would like more convenient ways to cook her vegetables. She is tolerating Saxenda well and states her hunger is well controlled.  Her weight is (!) 422 lb (191.4 kg) today and has had a weight loss of 2 pounds over a period of 2 weeks since her last visit. She has lost 46 lbs since starting treatment with [name].  Vitamin D Deficiency Catherine [name] has a diagnosis of vitamin D deficiency. She is currently taking prescription Vit D and denies nausea, vomiting or muscle weakness.  At risk for osteopenia and osteoporosis Catherine [name] is at higher risk of osteopenia and osteoporosis due to vitamin D deficiency.   Insulin Resistance Catherine [name] has a diagnosis of insulin resistance based on her elevated fasting insulin level >5. Although Catherine [name]'s blood glucose readings are still under good control, insulin resistance puts her at greater risk of metabolic syndrome and diabetes. She is on liraglutide, denies polyphagia and continues to work on diet and exercise to decrease risk of diabetes.  History of Iron Deficiency Anemia Catherine [name] has a history of heavy menstrual bleeding, status post ablation. She denies any current bleeding. She notes fatigue and denies chest pain or dyspnea.   ALLERGIES: Allergies  Allergen Reactions  . Citrus Swelling    MEDICATIONS: Current Outpatient Medications on File Prior to Visit  Medication Sig Dispense Refill  . aspirin-acetaminophen-caffeine (EXCEDRIN MIGRAINE) 250-250-65 MG per tablet Take 2 tablets by mouth 2 (two) times daily as needed for migraine. For headache     . budesonide (PULMICORT) 180 MCG/ACT inhaler Inhale 1 puff  into the lungs 2 (two) times daily. 1 Inhaler 1  . cetirizine (ZYRTEC) 10 MG tablet Take 10 mg by mouth daily.    . diclofenac (CATAFLAM) 50 MG tablet Take 50 mg by mouth 2 (two) times daily at 10 AM and 5 PM.    . diclofenac sodium (VOLTAREN) 1 % GEL Apply 2 g topically 4 (four) times daily. 100 g 1  . furosemide (LASIX) 20 MG tablet Take 1 tablet (20 mg total) by mouth 2 (two) times daily. 60 tablet 3  . gabapentin (NEURONTIN) 300 MG capsule Take 2 capsules (600 mg total) by mouth 3 (three) times daily. 180 capsule 3  . ibuprofen (ADVIL,MOTRIN) 200 MG tablet Take 600-800 mg by mouth every 8 (eight) hours as needed (for pain.).     . Insulin Pen Needle (BD PEN NEEDLE NANO U/F) 32G X 4 MM MISC 1 Package by Does not apply route daily at 12 noon. 100 each 0  . ranitidine (ZANTAC) 150 MG tablet TAKE 1 TABLET BY MOUTH 2 TIMES DAILY. 60 tablet 0  . SAXENDA 18 MG/3ML SOPN INJECT 3 MG INTO THE SKIN DAILY. 15 mL 0  . Vitamin D, Ergocalciferol, (DRISDOL) 50000 units CAPS capsule Take 1 capsule (50,000 Units total) by mouth every 7 (seven) days. 4 capsule 0   No current facility-administered medications on file prior to visit.     PAST MEDICAL HISTORY: Past Medical History:  Diagnosis Date  . Allergy   . Anxiety   . Arthritis   . Asthma    since baby, seasonal  .  ASTHMA, UNSPECIFIED 12/10/2006   Qualifier: Diagnosis of  By: Abundio Miu    . CELLULITIS AND ABSCESS OF LEG EXCEPT FOOT 12/30/2007   Qualifier: Diagnosis of  By: Daphine Deutscher FNP, Zena Amos    . Cyst of bone    right side of spine  . DEPENDENT EDEMA, LEGS, BILATERAL 11/22/2008   Qualifier: Diagnosis of  By: Barbaraann Barthel MD, [place]    . Depression 2003Dx  . Edema   . Food allergy    citris  . FOOT PAIN, RIGHT 11/22/2008   Qualifier: Diagnosis of  By: Barbaraann Barthel MD, [place]    . Gastric ulcer   . GERD (gastroesophageal reflux disease)   . Gout   . Hypertension    took Norvasc for 3 months, doctor discontinued no longer on BP medications    . Joint pain   . KNEE INJURY, LEFT 01/01/2009   Qualifier: Diagnosis of  By: Delrae Alfred MD, Lanora Manis    . Migraine   . ONYCHOMYCOSIS, TOENAILS 11/22/2008   Qualifier: Diagnosis of  By: Barbaraann Barthel MD, [place]    . Shortness of breath   . Sleep apnea   . TINEA PEDIS 11/22/2008   Qualifier: Diagnosis of  By: Barbaraann Barthel MD, Benetta Spar      PAST SURGICAL HISTORY: Past Surgical History:  Procedure Laterality Date  . DILITATION & CURRETTAGE/HYSTROSCOPY WITH NOVASURE ABLATION N/A 07/03/2017   Procedure: DILATATION & CURETTAGE/HYSTEROSCOPY WITH NOVASURE ABLATION;  Surgeon: Genia Del, MD;  Location: [facility];  Service: Gynecology;  Laterality: N/A;  . UPPER GI ENDOSCOPY    . WISDOM TOOTH EXTRACTION  2008   x4    SOCIAL HISTORY: Social History   Tobacco Use  . Smoking status: Former Smoker    Packs/day: 0.50    Years: 18.00    Pack years: 9.00    Types: Cigarettes    Start date: 10/13/1996    Last attempt to quit: 01/12/2015    Years since quitting: 2.9  . Smokeless tobacco: Never Used  Substance Use Topics  . Alcohol use: Yes    Alcohol/week: 0.6 oz    Types: 1 Standard drinks or equivalent per week    Comment: rarely drinks  . Drug use: No    Comment: marjuana - former use, quit 4 years ago    FAMILY HISTORY: Family History  Problem Relation Age of Onset  . Hypertension Mother   . Hyperlipidemia Mother   . Cancer Mother        bone cancer  . Heart disease Mother   . Sudden death Mother   . Stroke Mother   . Thyroid disease Mother   . Sleep apnea Mother   . Obesity Mother   . Sudden death Father   . Hypertension Father   . Heart disease Father   . Hyperlipidemia Father   . Prostate cancer Father   . Lung cancer Father   . Obesity Father   . Alcoholism Father   . Sudden death Brother   . Hypertension Brother   . Hyperlipidemia Brother   . Heart attack Brother   . Heart disease Brother   . Stroke Brother   . Alcoholism Brother   . Drug abuse Brother   . Heart  disease Sister   . Hypertension Sister   . Thyroid cancer Sister   . Diabetes Neg Hx   . Colon cancer Neg Hx   . Stomach cancer Neg Hx   . Esophageal cancer Neg Hx   . Rectal cancer Neg Hx   . Liver  cancer Neg Hx     ROS: Review of Systems  Constitutional: Positive for malaise/fatigue and weight loss.  Respiratory: Negative for shortness of breath.   Cardiovascular: Negative for chest pain.  Gastrointestinal: Negative for nausea and vomiting.  Musculoskeletal:       Negative muscle weakness  Endo/Heme/Allergies:       Negative polyphagia    PHYSICAL EXAM: Blood pressure 126/85, pulse 99, temperature 97.9 F (36.6 C), temperature source Oral, height 5\' 7"  (1.702 m), weight (!) 422 lb (191.4 kg), SpO2 98 %. Body mass index is 66.09 kg/m. Physical Exam  Constitutional: She is oriented to person, place, and time. She appears well-developed and well-nourished.  Cardiovascular: Normal rate.  Pulmonary/Chest: Effort normal.  Musculoskeletal: Normal range of motion.  Neurological: She is oriented to person, place, and time.  Skin: Skin is warm and dry.  Psychiatric: She has a normal mood and affect. Her behavior is normal.  Vitals reviewed.   RECENT LABS AND TESTS: BMET    Component Value Date/Time   NA 141 07/21/2017 0925   K 4.2 07/21/2017 0925   CL 103 07/21/2017 0925   CO2 22 07/21/2017 0925   GLUCOSE 98 07/21/2017 0925   GLUCOSE 83 06/04/2016 1155   BUN 13 07/21/2017 0925   CREATININE 0.76 07/21/2017 0925   CREATININE 0.90 06/04/2016 1155   CALCIUM 8.9 07/21/2017 0925   GFRNONAA 102 07/21/2017 0925   GFRNONAA 84 06/04/2016 1155   GFRAA 118 07/21/2017 0925   GFRAA >89 06/04/2016 1155   Lab Results  Component Value Date   HGBA1C 5.4 06/25/2017   HGBA1C 5.2 05/19/2017   HGBA1C 5.4 01/20/2017   HGBA1C 5.6 10/27/2016   HGBA1C 5.0 11/21/2015   Lab Results  Component Value Date   INSULIN 30.4 (H) 06/25/2017   INSULIN 29.7 (H) 01/20/2017   INSULIN 30.1 (H)  10/27/2016   CBC    Component Value Date/Time   WBC 13.3 (H) 07/03/2017 0610   RBC 4.33 07/03/2017 0610   HGB 11.2 (L) 07/03/2017 0610   HGB 11.2 05/18/2017 1611   HCT 34.6 (L) 07/03/2017 0610   HCT 35.6 05/18/2017 1611   PLT 293 07/03/2017 0610   PLT 343 05/18/2017 1611   MCV 79.9 07/03/2017 0610   MCV 85 05/18/2017 1611   MCH 25.9 (L) 07/03/2017 0610   MCHC 32.4 07/03/2017 0610   RDW 14.7 07/03/2017 0610   RDW 14.0 05/18/2017 1611   LYMPHSABS 4.4 (H) 07/03/2017 0610   LYMPHSABS 2.9 01/20/2017 0831   MONOABS 0.6 07/03/2017 0610   EOSABS 0.1 07/03/2017 0610   EOSABS 0.1 01/20/2017 0831   BASOSABS 0.0 07/03/2017 0610   BASOSABS 0.0 01/20/2017 0831   Iron/TIBC/Ferritin/ %Sat    Component Value Date/Time   IRON 25 (L) 05/18/2017 1611   TIBC 363 05/18/2017 1611   FERRITIN 16 05/18/2017 1611   IRONPCTSAT 7 (LL) 05/18/2017 1611   Lipid Panel     Component Value Date/Time   CHOL 152 10/27/2016 1058   TRIG 57 10/27/2016 1058   HDL 43 10/27/2016 1058   CHOLHDL 3.6 11/21/2015 1047   VLDL 10 11/21/2015 1047   LDLCALC 98 10/27/2016 1058   Hepatic Function Panel     Component Value Date/Time   PROT 7.0 06/25/2017 1026   ALBUMIN 3.9 06/25/2017 1026   AST 39 06/25/2017 1026   ALT 57 (H) 06/25/2017 1026   ALKPHOS 61 06/25/2017 1026   BILITOT 0.3 06/25/2017 1026      Component Value Date/Time  TSH 1.43 05/19/2017 1245   TSH 1.460 10/27/2016 1058   TSH 0.96 11/21/2015 1047  Results for Heywood IlesWALKER, Rael N (MRN [id number]) as of 12/10/2017 13:50  Ref. Range 06/25/2017 10:26  Vitamin D, 25-Hydroxy Latest Ref Range: 30.0 - 100.0 ng/mL 35.7    ASSESSMENT AND PLAN: Vitamin D deficiency - Plan: VITAMIN D 25 Hydroxy (Vit-D Deficiency, Fractures), Vitamin D, Ergocalciferol, (DRISDOL) 50000 units CAPS capsule  Insulin resistance - Plan: Comprehensive metabolic panel, Hemoglobin A1c, Insulin, random  History of iron deficiency anemia - Plan: CBC With Differential  At risk for  osteoporosis  Class 3 severe obesity with serious comorbidity and body mass index (BMI) of 60.0 to 69.9 in adult, unspecified obesity type (HCC)  PLAN:  Vitamin D Deficiency Catherine [name] was informed that low vitamin D levels contributes to fatigue and are associated with obesity, breast, and colon cancer. Catherine [name] agrees to continue taking prescription Vit D @50 ,000 IU every week #4 and we will refill for 1 month. She will follow up for routine testing of vitamin D, at least 2-3 times per year. She was informed of the risk of over-replacement of vitamin D and agrees to not increase her dose unless she discusses this with us first. Catherine [name] agrees to follow up with our clinic in 2 weeks.  At risk for osteopenia and osteoporosis Catherine [name] is at risk for osteopenia and osteoporsis due to her vitamin D deficiency. She was encouraged to take her vitamin D and follow her higher calcium diet and increase strengthening exercise to help strengthen her bones and decrease her risk of osteopenia and osteoporosis.  Insulin Resistance Catherine [name] will continue to work on weight loss, diet, exercise, and decreasing simple carbohydrates in her diet to help decrease the risk of diabetes. We dicussed metformin including benefits and risks. She was informed that eating too many simple carbohydrates or too many calories at one sitting increases the likelihood of GI side effects. We will check labs and Hero agrees to follow up with our clinic in 2 weeks as directed to monitor her progress.  History of Iron Deficiency Anemia The diagnosis of Iron deficiency anemia was discussed with Catherine [name] and was explained in detail. She was given suggestions of iron rich foods and and iron supplement was not prescribed. We will check CBC and Catherine [name] agrees to follow up with our clinic in 2 weeks.  Obesity Catherine [name] is currently in the action stage of change. As such, her goal is to continue with weight loss efforts She has agreed to follow  the Category 3 plan Catherine [name] has been instructed to work up to a goal of 150 minutes of combined cardio and strengthening exercise per week for weight loss and overall health benefits. We discussed the following Behavioral Modification Strategies today: increasing fiber rich foods and work on meal planning and easy cooking plans We discussed various medication options to help Pebble with her weight loss efforts and we both agreed to continue Saxenda 5 pens and we will refill for 1 month.   Catherine [name] has agreed to follow up with our clinic in 2 weeks. She was informed of the importance of frequent follow up visits to maximize her success with intensive lifestyle modifications for her multiple health conditions.   Trude McburneyI, Sharon Martin, am acting as transcriptionist for Illa LevelSahar Osman, PA-C I, Illa LevelSahar Osman [facility] have reviewed this note and agree with its content

## 2017-12-11 LAB — HEMOGLOBIN A1C
Est. average glucose Bld gHb Est-mCnc: 105 mg/dL
Hgb A1c MFr Bld: 5.3 % (ref 4.8–5.6)

## 2017-12-11 LAB — COMPREHENSIVE METABOLIC PANEL
ALT: 27 IU/L (ref 0–32)
AST: 22 IU/L (ref 0–40)
Albumin/Globulin Ratio: 1.4 (ref 1.2–2.2)
Albumin: 4.2 g/dL (ref 3.5–5.5)
Alkaline Phosphatase: 62 IU/L (ref 39–117)
BUN/Creatinine Ratio: 14 (ref 9–23)
BUN: 11 mg/dL (ref 6–20)
Bilirubin Total: 0.5 mg/dL (ref 0.0–1.2)
CO2: 19 mmol/L — ABNORMAL LOW (ref 20–29)
Calcium: 9 mg/dL (ref 8.7–10.2)
Chloride: 105 mmol/L (ref 96–106)
Creatinine, Ser: 0.81 mg/dL (ref 0.57–1.00)
GFR calc Af Amer: 108 mL/min/{1.73_m2} (ref >59)
GFR calc non Af Amer: 94 mL/min/{1.73_m2} (ref >59)
Globulin, Total: 3 g/dL (ref 1.5–4.5)
Glucose: 83 mg/dL (ref 65–99)
Potassium: 3.7 mmol/L (ref 3.5–5.2)
Sodium: 140 mmol/L (ref 134–144)
Total Protein: 7.2 g/dL (ref 6.0–8.5)

## 2017-12-11 LAB — CBC WITH DIFFERENTIAL
Basophils Absolute: 0 10*3/uL (ref 0.0–0.2)
Basos: 0 %
EOS (ABSOLUTE): 0 10*3/uL (ref 0.0–0.4)
Eos: 0 %
Hematocrit: 38.4 % (ref 34.0–46.6)
Hemoglobin: 12.5 g/dL (ref 11.1–15.9)
Immature Grans (Abs): 0 10*3/uL (ref 0.0–0.1)
Immature Granulocytes: 0 %
Lymphocytes Absolute: 5.2 10*3/uL — ABNORMAL HIGH (ref 0.7–3.1)
Lymphs: 49 %
MCH: 26.9 pg (ref 26.6–33.0)
MCHC: 32.6 g/dL (ref 31.5–35.7)
MCV: 83 fL (ref 79–97)
Monocytes Absolute: 0.8 10*3/uL (ref 0.1–0.9)
Monocytes: 8 %
Neutrophils Absolute: 4.6 10*3/uL (ref 1.4–7.0)
Neutrophils: 43 %
RBC: 4.64 x10E6/uL (ref 3.77–5.28)
RDW: 16.2 % — ABNORMAL HIGH (ref 12.3–15.4)
WBC: 10.6 10*3/uL (ref 3.4–10.8)

## 2017-12-11 LAB — INSULIN, RANDOM: INSULIN: 43.6 u[IU]/mL — ABNORMAL HIGH (ref 2.6–24.9)

## 2017-12-11 LAB — VITAMIN D 25 HYDROXY (VIT D DEFICIENCY, FRACTURES): Vit D, 25-Hydroxy: 28.5 ng/mL — ABNORMAL LOW (ref 30.0–100.0)

## 2017-12-15 ENCOUNTER — Telehealth (INDEPENDENT_AMBULATORY_CARE_PROVIDER_SITE_OTHER): Payer: Self-pay | Admitting: Physician Assistant

## 2017-12-15 ENCOUNTER — Other Ambulatory Visit (INDEPENDENT_AMBULATORY_CARE_PROVIDER_SITE_OTHER): Payer: Self-pay | Admitting: Physician Assistant

## 2017-12-15 DIAGNOSIS — Z6841 Body Mass Index (BMI) 40.0 and over, adult: Principal | ICD-10-CM

## 2017-12-15 NOTE — Telephone Encounter (Signed)
saxenda refill request to be called in [facility] [address]

## 2017-12-16 MED ORDER — LIRAGLUTIDE -WEIGHT MANAGEMENT 18 MG/3ML ~~LOC~~ SOPN: 3.0000 mg | PEN_INJECTOR | Freq: Every day | SUBCUTANEOUS | 0 refills | Status: DC

## 2017-12-16 MED FILL — SAXENDA 18 MG/3 ML PEN: 18 | 30 days supply | Qty: 15 | Fill #0

## 2017-12-16 NOTE — Telephone Encounter (Signed)
[facility] [address]

## 2017-12-24 ENCOUNTER — Ambulatory Visit (INDEPENDENT_AMBULATORY_CARE_PROVIDER_SITE_OTHER): Payer: 59 | Admitting: Physician Assistant

## 2017-12-24 VITALS — BP 135/73 | HR 83 | Temp 97.4°F | Ht 67.0 in | Wt >= 6400 oz

## 2017-12-24 DIAGNOSIS — Z6841 Body Mass Index (BMI) 40.0 and over, adult: Secondary | ICD-10-CM | POA: Diagnosis not present

## 2017-12-24 DIAGNOSIS — E559 Vitamin D deficiency, unspecified: Secondary | ICD-10-CM | POA: Diagnosis not present

## 2017-12-24 NOTE — Progress Notes (Signed)
Office: [phone]  /  Fax: [phone]   HPI:   Chief Complaint: OBESITY Catherine [name] is here to discuss her progress with her obesity treatment plan. She is on the Category 3 plan and is following her eating plan approximately 87 % of the time. She states she is exercising 0 minutes 0 times per week. Makiyla continues to be mindful of her eating and makes better food choices. She states she has been incorporating more protein shakes as a meal replacement. Her weight is (!) 425 lb (192.8 kg) today and has had a weight gain of 3 pounds over a period of 2 weeks since her last visit. She has lost 43 lbs since starting treatment with [name].  Vitamin D deficiency Naryah has a diagnosis of vitamin D deficiency. Her vitamin D level decreased to 28.5 She is currently taking vit D and denies nausea, vomiting or muscle weakness.   Ref. Range 12/10/2017 12:42  Vitamin D, 25-Hydroxy Latest Ref Range: 30.0 - 100.0 ng/mL 28.5 (L)   ALLERGIES: Allergies  Allergen Reactions  . Citrus Swelling    MEDICATIONS: Current Outpatient Medications on File Prior to Visit  Medication Sig Dispense Refill  . aspirin-acetaminophen-caffeine (EXCEDRIN MIGRAINE) 250-250-65 MG per tablet Take 2 tablets by mouth 2 (two) times daily as needed for migraine. For headache     . budesonide (PULMICORT) 180 MCG/ACT inhaler Inhale 1 puff into the lungs 2 (two) times daily. 1 Inhaler 1  . cetirizine (ZYRTEC) 10 MG tablet Take 10 mg by mouth daily.    . diclofenac (CATAFLAM) 50 MG tablet Take 50 mg by mouth 2 (two) times daily at 10 AM and 5 PM.    . diclofenac sodium (VOLTAREN) 1 % GEL Apply 2 g topically 4 (four) times daily. 100 g 1  . furosemide (LASIX) 20 MG tablet Take 1 tablet (20 mg total) by mouth 2 (two) times daily. 60 tablet 3  . gabapentin (NEURONTIN) 300 MG capsule Take 2 capsules (600 mg total) by mouth 3 (three) times daily. 180 capsule 3  . ibuprofen (ADVIL,MOTRIN) 200 MG tablet Take 600-800 mg by mouth every 8  (eight) hours as needed (for pain.).     . Insulin Pen Needle (BD PEN NEEDLE NANO U/F) 32G X 4 MM MISC 1 Package by Does not apply route daily at 12 noon. 100 each 0  . Liraglutide -Weight Management (SAXENDA) 18 MG/3ML SOPN Inject 3 mg into the skin daily. 5 pen 0  . ranitidine (ZANTAC) 150 MG tablet TAKE 1 TABLET BY MOUTH 2 TIMES DAILY. 60 tablet 0  . Vitamin D, Ergocalciferol, (DRISDOL) 50000 units CAPS capsule Take 1 capsule (50,000 Units total) by mouth every 7 (seven) days. 4 capsule 0   No current facility-administered medications on file prior to visit.     PAST MEDICAL HISTORY: Past Medical History:  Diagnosis Date  . Allergy   . Anxiety   . Arthritis   . Asthma    since baby, seasonal  . ASTHMA, UNSPECIFIED 12/10/2006   Qualifier: Diagnosis of  By: Abundio Miu    . CELLULITIS AND ABSCESS OF LEG EXCEPT FOOT 12/30/2007   Qualifier: Diagnosis of  By: Daphine Deutscher FNP, Zena Amos    . Cyst of bone    right side of spine  . DEPENDENT EDEMA, LEGS, BILATERAL 11/22/2008   Qualifier: Diagnosis of  By: Barbaraann Barthel MD, [place]    . Depression 2003Dx  . Edema   . Food allergy    citris  . FOOT PAIN, RIGHT  11/22/2008   Qualifier: Diagnosis of  By: Barbaraann Barthel MD, [place]    . Gastric ulcer   . GERD (gastroesophageal reflux disease)   . Gout   . Hypertension    took Norvasc for 3 months, doctor discontinued no longer on BP medications  . Joint pain   . KNEE INJURY, LEFT 01/01/2009   Qualifier: Diagnosis of  By: Delrae Alfred MD, Lanora Manis    . Migraine   . ONYCHOMYCOSIS, TOENAILS 11/22/2008   Qualifier: Diagnosis of  By: Barbaraann Barthel MD, [place]    . Shortness of breath   . Sleep apnea   . TINEA PEDIS 11/22/2008   Qualifier: Diagnosis of  By: Barbaraann Barthel MD, Benetta Spar      PAST SURGICAL HISTORY: Past Surgical History:  Procedure Laterality Date  . DILITATION & CURRETTAGE/HYSTROSCOPY WITH NOVASURE ABLATION N/A 07/03/2017   Procedure: DILATATION & CURETTAGE/HYSTEROSCOPY WITH NOVASURE ABLATION;   Surgeon: Genia Del, MD;  Location: [facility];  Service: Gynecology;  Laterality: N/A;  . UPPER GI ENDOSCOPY    . WISDOM TOOTH EXTRACTION  2008   x4    SOCIAL HISTORY: Social History   Tobacco Use  . Smoking status: Former Smoker    Packs/day: 0.50    Years: 18.00    Pack years: 9.00    Types: Cigarettes    Start date: 10/13/1996    Last attempt to quit: 01/12/2015    Years since quitting: 2.9  . Smokeless tobacco: Never Used  Substance Use Topics  . Alcohol use: Yes    Alcohol/week: 0.6 oz    Types: 1 Standard drinks or equivalent per week    Comment: rarely drinks  . Drug use: No    Comment: marjuana - former use, quit 4 years ago    FAMILY HISTORY: Family History  Problem Relation Age of Onset  . Hypertension Mother   . Hyperlipidemia Mother   . Cancer Mother        bone cancer  . Heart disease Mother   . Sudden death Mother   . Stroke Mother   . Thyroid disease Mother   . Sleep apnea Mother   . Obesity Mother   . Sudden death Father   . Hypertension Father   . Heart disease Father   . Hyperlipidemia Father   . Prostate cancer Father   . Lung cancer Father   . Obesity Father   . Alcoholism Father   . Sudden death Brother   . Hypertension Brother   . Hyperlipidemia Brother   . Heart attack Brother   . Heart disease Brother   . Stroke Brother   . Alcoholism Brother   . Drug abuse Brother   . Heart disease Sister   . Hypertension Sister   . Thyroid cancer Sister   . Diabetes Neg Hx   . Colon cancer Neg Hx   . Stomach cancer Neg Hx   . Esophageal cancer Neg Hx   . Rectal cancer Neg Hx   . Liver cancer Neg Hx     ROS: Review of Systems  Constitutional: Negative for weight loss.  Gastrointestinal: Negative for nausea and vomiting.  Musculoskeletal:       Negative for muscle weakness    PHYSICAL EXAM: Blood pressure 135/73, pulse 83, temperature (!) 97.4 F (36.3 C), temperature source Oral, height 5\' 7"  (1.702 m), weight (!) 425 lb (192.8  kg), SpO2 98 %. Body mass index is 66.56 kg/m. Physical Exam  Constitutional: She is oriented to person, place, and time. She appears  well-developed and well-nourished.  Cardiovascular: Normal rate.  Pulmonary/Chest: Effort normal.  Musculoskeletal: Normal range of motion.  Neurological: She is oriented to person, place, and time.  Skin: Skin is warm and dry.  Psychiatric: She has a normal mood and affect.  Vitals reviewed.   RECENT LABS AND TESTS: BMET    Component Value Date/Time   NA 140 12/10/2017 1242   K 3.7 12/10/2017 1242   CL 105 12/10/2017 1242   CO2 19 (L) 12/10/2017 1242   GLUCOSE 83 12/10/2017 1242   GLUCOSE 83 06/04/2016 1155   BUN 11 12/10/2017 1242   CREATININE 0.81 12/10/2017 1242   CREATININE 0.90 06/04/2016 1155   CALCIUM 9.0 12/10/2017 1242   GFRNONAA 94 12/10/2017 1242   GFRNONAA 84 06/04/2016 1155   GFRAA 108 12/10/2017 1242   GFRAA >89 06/04/2016 1155   Lab Results  Component Value Date   HGBA1C 5.3 12/10/2017   HGBA1C 5.4 06/25/2017   HGBA1C 5.2 05/19/2017   HGBA1C 5.4 01/20/2017   HGBA1C 5.6 10/27/2016   Lab Results  Component Value Date   INSULIN 43.6 (H) 12/10/2017   INSULIN 30.4 (H) 06/25/2017   INSULIN 29.7 (H) 01/20/2017   INSULIN 30.1 (H) 10/27/2016   CBC    Component Value Date/Time   WBC 10.6 12/10/2017 1242   WBC 13.3 (H) 07/03/2017 0610   RBC 4.64 12/10/2017 1242   RBC 4.33 07/03/2017 0610   HGB 12.5 12/10/2017 1242   HCT 38.4 12/10/2017 1242   PLT 293 07/03/2017 0610   PLT 343 05/18/2017 1611   MCV 83 12/10/2017 1242   MCH 26.9 12/10/2017 1242   MCH 25.9 (L) 07/03/2017 0610   MCHC 32.6 12/10/2017 1242   MCHC 32.4 07/03/2017 0610   RDW 16.2 (H) 12/10/2017 1242   LYMPHSABS 5.2 (H) 12/10/2017 1242   MONOABS 0.6 07/03/2017 0610   EOSABS 0.0 12/10/2017 1242   BASOSABS 0.0 12/10/2017 1242   Iron/TIBC/Ferritin/ %Sat    Component Value Date/Time   IRON 25 (L) 05/18/2017 1611   TIBC 363 05/18/2017 1611   FERRITIN 16  05/18/2017 1611   IRONPCTSAT 7 (LL) 05/18/2017 1611   Lipid Panel     Component Value Date/Time   CHOL 152 10/27/2016 1058   TRIG 57 10/27/2016 1058   HDL 43 10/27/2016 1058   CHOLHDL 3.6 11/21/2015 1047   VLDL 10 11/21/2015 1047   LDLCALC 98 10/27/2016 1058   Hepatic Function Panel     Component Value Date/Time   PROT 7.2 12/10/2017 1242   ALBUMIN 4.2 12/10/2017 1242   AST 22 12/10/2017 1242   ALT 27 12/10/2017 1242   ALKPHOS 62 12/10/2017 1242   BILITOT 0.5 12/10/2017 1242      Component Value Date/Time   TSH 1.43 05/19/2017 1245   TSH 1.460 10/27/2016 1058   TSH 0.96 11/21/2015 1047    Ref. Range 12/10/2017 12:42  Vitamin D, 25-Hydroxy Latest Ref Range: 30.0 - 100.0 ng/mL 28.5 (L)   ASSESSMENT AND PLAN: Vitamin D deficiency  Class 3 severe obesity with serious comorbidity and body mass index (BMI) of 60.0 to 69.9 in adult, unspecified obesity type (HCC)  PLAN:  Vitamin D Deficiency Daveigh was informed that low vitamin D levels contributes to fatigue and are associated with obesity, breast, and colon cancer. She agrees to continue to take prescription Vit D ,000 IU every week and will follow up for routine testing of vitamin D, at least 2-3 times per year. She was informed of the risk  of over-replacement of vitamin D and agrees to not increase her dose unless she discusses this with [name] first.  We spent > than 50% of the 15 minute visit on the counseling as documented in the note.  Obesity Sahiba is currently in the action stage of change. As such, her goal is to continue with weight loss efforts She has agreed to follow the Category 3 plan Alyzabeth has been instructed to work up to a goal of 150 minutes of combined cardio and strengthening exercise per week for weight loss and overall health benefits. We discussed the following Behavioral Modification Strategies today: increasing lean protein intake and no skipping meals  Roberta has agreed to follow up with  our clinic in 2 weeks. She was informed of the importance of frequent follow up visits to maximize her success with intensive lifestyle modifications for her multiple health conditions.   Cristi Loron, am acting as transcriptionist for [employer], PA-C I, Illa Level [facility], have reviewed this note and agree with its content

## 2017-12-29 ENCOUNTER — Encounter (INDEPENDENT_AMBULATORY_CARE_PROVIDER_SITE_OTHER): Payer: Self-pay

## 2018-01-07 ENCOUNTER — Ambulatory Visit (INDEPENDENT_AMBULATORY_CARE_PROVIDER_SITE_OTHER): Payer: 59 | Admitting: Physician Assistant

## 2018-01-07 VITALS — BP 94/66 | HR 93 | Temp 97.8°F | Ht 67.0 in | Wt >= 6400 oz

## 2018-01-07 DIAGNOSIS — Z6841 Body Mass Index (BMI) 40.0 and over, adult: Secondary | ICD-10-CM | POA: Diagnosis not present

## 2018-01-07 DIAGNOSIS — I9589 Other hypotension: Secondary | ICD-10-CM | POA: Diagnosis not present

## 2018-01-07 DIAGNOSIS — K5909 Other constipation: Secondary | ICD-10-CM | POA: Diagnosis not present

## 2018-01-07 DIAGNOSIS — E559 Vitamin D deficiency, unspecified: Secondary | ICD-10-CM

## 2018-01-07 DIAGNOSIS — Z9189 Other specified personal risk factors, not elsewhere classified: Secondary | ICD-10-CM | POA: Diagnosis not present

## 2018-01-07 MED ORDER — LIRAGLUTIDE -WEIGHT MANAGEMENT 18 MG/3ML ~~LOC~~ SOPN: 3.0000 mg | PEN_INJECTOR | Freq: Every day | SUBCUTANEOUS | 0 refills | Status: DC

## 2018-01-07 MED ORDER — POLYETHYLENE GLYCOL 3350 17 GM/SCOOP PO POWD: 17.0000 g | Freq: Every day | ORAL | 1 refills | Status: DC | PRN

## 2018-01-07 NOTE — Progress Notes (Addendum)
Office: [phone]  /  Fax: [phone]   HPI:   Chief Complaint: OBESITY Catherine [name] is here to discuss her progress with her obesity treatment plan. She is on the Category 3 plan and is following her eating plan approximately 85 % of the time. She states she is exercising 0 minutes 0 times per week. Junnie continues to do well with weight loss. She continues to do be very motivated with her weight loss and is very mindful of eating.  Her weight is (!) 420 lb (190.5 kg) today and has had a weight loss of 5 pounds over a period of 2 weeks since her last visit. She has lost 48 lbs since starting treatment with [name].  Constipation Catherine [name] notes constipation for the last few weeks, worse since attempting weight loss. She states BM are less frequent and are not hard and painful. She denies hematochezia or melena. She denies drinking less H20 recently.  Hypotension Pt with low BP. She is on Lasix. BP is low on multiple occasions in the office and has been referred by me to her PCP for further evaluation and need for decreasing her Lasix. Lasix was decreased to once daily. Today, patient is asymptomatic. Denies any dizziness, weakness, LOC.  Vitamin D Deficiency [name] has a diagnosis of vitamin D deficiency. She is currently taking vit D and denies nausea, vomiting or muscle weakness.  At risk for osteopenia and osteoporosis [name] is at higher risk of osteopenia and osteoporosis due to vitamin D deficiency.   ALLERGIES: Allergies  Allergen Reactions  . Citrus Swelling    MEDICATIONS: Current Outpatient Medications on File Prior to Visit  Medication Sig Dispense Refill  . aspirin-acetaminophen-caffeine (EXCEDRIN MIGRAINE) 250-250-65 MG per tablet Take 2 tablets by mouth 2 (two) times daily as needed for migraine. For headache     . budesonide (PULMICORT) 180 MCG/ACT inhaler Inhale 1 puff into the lungs 2 (two) times daily. 1 Inhaler 1  . cetirizine (ZYRTEC) 10 MG tablet Take 10 mg by  mouth daily.    . diclofenac (CATAFLAM) 50 MG tablet Take 50 mg by mouth 2 (two) times daily at 10 AM and 5 PM.    . diclofenac sodium (VOLTAREN) 1 % GEL Apply 2 g topically 4 (four) times daily. 100 g 1  . furosemide (LASIX) 20 MG tablet Take 1 tablet (20 mg total) by mouth 2 (two) times daily. 60 tablet 3  . gabapentin (NEURONTIN) 300 MG capsule Take 2 capsules (600 mg total) by mouth 3 (three) times daily. 180 capsule 3  . ibuprofen (ADVIL,MOTRIN) 200 MG tablet Take 600-800 mg by mouth every 8 (eight) hours as needed (for pain.).     . Insulin Pen Needle (BD PEN NEEDLE NANO U/F) 32G X 4 MM MISC 1 Package by Does not apply route daily at 12 noon. 100 each 0  . ranitidine (ZANTAC) 150 MG tablet TAKE 1 TABLET BY MOUTH 2 TIMES DAILY. 60 tablet 0  . Vitamin D, Ergocalciferol, (DRISDOL) 50000 units CAPS capsule Take 1 capsule (50,000 Units total) by mouth every 7 (seven) days. 4 capsule 0   No current facility-administered medications on file prior to visit.     PAST MEDICAL HISTORY: Past Medical History:  Diagnosis Date  . Allergy   . Anxiety   . Arthritis   . Asthma    since baby, seasonal  . ASTHMA, UNSPECIFIED 12/10/2006   Qualifier: Diagnosis of  By: Abundio Miu    . CELLULITIS AND ABSCESS OF LEG EXCEPT  FOOT 12/30/2007   Qualifier: Diagnosis of  By: Daphine Deutscher FNP, Zena Amos    . Cyst of bone    right side of spine  . DEPENDENT EDEMA, LEGS, BILATERAL 11/22/2008   Qualifier: Diagnosis of  By: Barbaraann Barthel MD, [place]    . Depression 2003Dx  . Edema   . Food allergy    citris  . FOOT PAIN, RIGHT 11/22/2008   Qualifier: Diagnosis of  By: Barbaraann Barthel MD, [place]    . Gastric ulcer   . GERD (gastroesophageal reflux disease)   . Gout   . Hypertension    took Norvasc for 3 months, doctor discontinued no longer on BP medications  . Joint pain   . KNEE INJURY, LEFT 01/01/2009   Qualifier: Diagnosis of  By: Delrae Alfred MD, Lanora Manis    . Migraine   . ONYCHOMYCOSIS, TOENAILS 11/22/2008    Qualifier: Diagnosis of  By: Barbaraann Barthel MD, [place]    . Shortness of breath   . Sleep apnea   . TINEA PEDIS 11/22/2008   Qualifier: Diagnosis of  By: Barbaraann Barthel MD, Benetta Spar      PAST SURGICAL HISTORY: Past Surgical History:  Procedure Laterality Date  . DILITATION & CURRETTAGE/HYSTROSCOPY WITH NOVASURE ABLATION N/A 07/03/2017   Procedure: DILATATION & CURETTAGE/HYSTEROSCOPY WITH NOVASURE ABLATION;  Surgeon: Genia Del, MD;  Location: [facility];  Service: Gynecology;  Laterality: N/A;  . UPPER GI ENDOSCOPY    . WISDOM TOOTH EXTRACTION  2008   x4    SOCIAL HISTORY: Social History   Tobacco Use  . Smoking status: Former Smoker    Packs/day: 0.50    Years: 18.00    Pack years: 9.00    Types: Cigarettes    Start date: 10/13/1996    Last attempt to quit: 01/12/2015    Years since quitting: 2.9  . Smokeless tobacco: Never Used  Substance Use Topics  . Alcohol use: Yes    Alcohol/week: 0.6 oz    Types: 1 Standard drinks or equivalent per week    Comment: rarely drinks  . Drug use: No    Comment: marjuana - former use, quit 4 years ago    FAMILY HISTORY: Family History  Problem Relation Age of Onset  . Hypertension Mother   . Hyperlipidemia Mother   . Cancer Mother        bone cancer  . Heart disease Mother   . Sudden death Mother   . Stroke Mother   . Thyroid disease Mother   . Sleep apnea Mother   . Obesity Mother   . Sudden death Father   . Hypertension Father   . Heart disease Father   . Hyperlipidemia Father   . Prostate cancer Father   . Lung cancer Father   . Obesity Father   . Alcoholism Father   . Sudden death Brother   . Hypertension Brother   . Hyperlipidemia Brother   . Heart attack Brother   . Heart disease Brother   . Stroke Brother   . Alcoholism Brother   . Drug abuse Brother   . Heart disease Sister   . Hypertension Sister   . Thyroid cancer Sister   . Diabetes Neg Hx   . Colon cancer Neg Hx   . Stomach cancer Neg Hx   . Esophageal cancer  Neg Hx   . Rectal cancer Neg Hx   . Liver cancer Neg Hx     ROS: Review of Systems  Constitutional: Positive for weight loss.  Gastrointestinal: Positive for constipation. Negative  for melena, nausea and vomiting.       Negative hematochezia  Musculoskeletal:       Negative muscle weakness    PHYSICAL EXAM: Blood pressure 94/66, pulse 93, temperature 97.8 F (36.6 C), temperature source Oral, height 5\' 7"  (1.702 m), weight (!) 420 lb (190.5 kg), SpO2 98 %. Body mass index is 65.78 kg/m. Physical Exam  Constitutional: She is oriented to person, place, and time. She appears well-developed and well-nourished.  Cardiovascular: Normal rate.  Pulmonary/Chest: Effort normal.  Musculoskeletal: Normal range of motion.  Neurological: She is oriented to person, place, and time.  Skin: Skin is warm and dry.  Psychiatric: She has a normal mood and affect. Her behavior is normal.  Vitals reviewed.   RECENT LABS AND TESTS: BMET    Component Value Date/Time   NA 140 12/10/2017 1242   K 3.7 12/10/2017 1242   CL 105 12/10/2017 1242   CO2 19 (L) 12/10/2017 1242   GLUCOSE 83 12/10/2017 1242   GLUCOSE 83 06/04/2016 1155   BUN 11 12/10/2017 1242   CREATININE 0.81 12/10/2017 1242   CREATININE 0.90 06/04/2016 1155   CALCIUM 9.0 12/10/2017 1242   GFRNONAA 94 12/10/2017 1242   GFRNONAA 84 06/04/2016 1155   GFRAA 108 12/10/2017 1242   GFRAA >89 06/04/2016 1155   Lab Results  Component Value Date   HGBA1C 5.3 12/10/2017   HGBA1C 5.4 06/25/2017   HGBA1C 5.2 05/19/2017   HGBA1C 5.4 01/20/2017   HGBA1C 5.6 10/27/2016   Lab Results  Component Value Date   INSULIN 43.6 (H) 12/10/2017   INSULIN 30.4 (H) 06/25/2017   INSULIN 29.7 (H) 01/20/2017   INSULIN 30.1 (H) 10/27/2016   CBC    Component Value Date/Time   WBC 10.6 12/10/2017 1242   WBC 13.3 (H) 07/03/2017 0610   RBC 4.64 12/10/2017 1242   RBC 4.33 07/03/2017 0610   HGB 12.5 12/10/2017 1242   HCT 38.4 12/10/2017 1242   PLT  293 07/03/2017 0610   PLT 343 05/18/2017 1611   MCV 83 12/10/2017 1242   MCH 26.9 12/10/2017 1242   MCH 25.9 (L) 07/03/2017 0610   MCHC 32.6 12/10/2017 1242   MCHC 32.4 07/03/2017 0610   RDW 16.2 (H) 12/10/2017 1242   LYMPHSABS 5.2 (H) 12/10/2017 1242   MONOABS 0.6 07/03/2017 0610   EOSABS 0.0 12/10/2017 1242   BASOSABS 0.0 12/10/2017 1242   Iron/TIBC/Ferritin/ %Sat    Component Value Date/Time   IRON 25 (L) 05/18/2017 1611   TIBC 363 05/18/2017 1611   FERRITIN 16 05/18/2017 1611   IRONPCTSAT 7 (LL) 05/18/2017 1611   Lipid Panel     Component Value Date/Time   CHOL 152 10/27/2016 1058   TRIG 57 10/27/2016 1058   HDL 43 10/27/2016 1058   CHOLHDL 3.6 11/21/2015 1047   VLDL 10 11/21/2015 1047   LDLCALC 98 10/27/2016 1058   Hepatic Function Panel     Component Value Date/Time   PROT 7.2 12/10/2017 1242   ALBUMIN 4.2 12/10/2017 1242   AST 22 12/10/2017 1242   ALT 27 12/10/2017 1242   ALKPHOS 62 12/10/2017 1242   BILITOT 0.5 12/10/2017 1242      Component Value Date/Time   TSH 1.43 05/19/2017 1245   TSH 1.460 10/27/2016 1058   TSH 0.96 11/21/2015 1047  Results for Heywood IlesWALKER, Alekhya N (MRN [id number]) as of 01/07/2018 16:43  Ref. Range 12/10/2017 12:42  Vitamin D, 25-Hydroxy Latest Ref Range: 30.0 - 100.0 ng/mL 28.5 (L)  ASSESSMENT AND PLAN: Other constipation - Plan: polyethylene glycol powder (GLYCOLAX/MIRALAX) powder  Vitamin D deficiency  At risk for osteopenia  Other specified hypotension  Class 3 severe obesity with serious comorbidity and body mass index (BMI) of 60.0 to 69.9 in adult, unspecified obesity type (HCC) - Plan: Liraglutide -Weight Management (SAXENDA) 18 MG/3ML SOPN  PLAN:  Constipation Elleigh was informed decrease bowel movement frequency is normal while losing weight, but stools should not be hard or painful. She was advised to increase her H20 intake and work on increasing her fiber intake. High fiber foods were discussed today. Marianny  agrees to start miralax 17g 1 capful as needed OTC. Avleen agrees to follow up with our clinic in 2 weeks.  Vitamin D Deficiency Oaklyn was informed that low vitamin D levels contributes to fatigue and are associated with obesity, breast, and colon cancer. Frankye agrees to continue taking prescription Vit D @50 ,000 IU every week #4 and will follow up for routine testing of vitamin D, at least 2-3 times per year. She was informed of the risk of over-replacement of vitamin D and agrees to not increase her dose unless she discusses this with [name] first. Anadia agrees to follow up with our clinic in 2 weeks.  Hypotension Kristalyn is advised that if hypotension continues, to follow up with her PCP for further management. If she develops any new symptoms, to go to the ER.   At risk for osteopenia and osteoporosis Michaline is at risk for osteopenia and osteoporsis due to her vitamin D deficiency. She was encouraged to take her vitamin D and follow her higher calcium diet and increase strengthening exercise to help strengthen her bones and decrease her risk of osteopenia and osteoporosis.  Obesity Zaeda is currently in the action stage of change. As such, her goal is to continue with weight loss efforts She has agreed to follow the Category 3 plan Tahiri has been instructed to work up to a goal of 150 minutes of combined cardio and strengthening exercise per week for weight loss and overall health benefits. We discussed the following Behavioral Modification Strategies today: increasing lean protein intake and work on meal planning and easy cooking plans We discussed various medication options to help Leani with her weight loss efforts and we both agreed to continue Saxenda 5 pens (Pt at 3  mg) and we will refill for 1 month.   Jazmin has agreed to follow up with our clinic in 2 weeks. She was informed of the importance of frequent follow up visits to maximize her success with intensive lifestyle  modifications for her multiple health conditions.   Trude Mcburney, am acting as transcriptionist for Illa Level, PA-C I, Illa Level [facility], have reviewed this note and agree with its content

## 2018-01-11 ENCOUNTER — Other Ambulatory Visit: Payer: Self-pay | Admitting: Internal Medicine

## 2018-01-11 MED FILL — SAXENDA 18 MG/3 ML PEN: 18 | 30 days supply | Qty: 15 | Fill #0

## 2018-01-11 MED FILL — FUROSEMIDE 20 MG TABS: 20 | 30 days supply | Qty: 60 | Fill #3

## 2018-01-11 MED FILL — UNIFINE PENTIPS 32GX5/32: 32G X 4 MM | 90 days supply | Qty: 100 | Fill #0

## 2018-01-11 MED FILL — GABAPENTIN 300 MG CAPSULE: 300 | 30 days supply | Qty: 180 | Fill #0

## 2018-01-11 MED FILL — UNIFINE PENTIPS 32GX5/32": 32G X 4 MM | 90 days supply | Qty: 100 | Fill #0

## 2018-01-18 ENCOUNTER — Telehealth: Payer: Self-pay | Admitting: *Deleted

## 2018-01-18 ENCOUNTER — Encounter: Payer: Self-pay | Admitting: Internal Medicine

## 2018-01-18 ENCOUNTER — Ambulatory Visit (INDEPENDENT_AMBULATORY_CARE_PROVIDER_SITE_OTHER): Payer: 59 | Admitting: Internal Medicine

## 2018-01-18 ENCOUNTER — Ambulatory Visit
Admission: RE | Admit: 2018-01-18 | Discharge: 2018-01-18 | Disposition: A | Discharge status: Home / Self Care | Payer: 59 | Source: Ambulatory Visit | Attending: Family Medicine | Admitting: Family Medicine

## 2018-01-18 ENCOUNTER — Other Ambulatory Visit: Payer: Self-pay

## 2018-01-18 VITALS — BP 100/58 | HR 86 | Temp 98.3°F | Ht 67.0 in | Wt >= 6400 oz

## 2018-01-18 DIAGNOSIS — M25561 Pain in right knee: Secondary | ICD-10-CM | POA: Diagnosis not present

## 2018-01-18 DIAGNOSIS — M25562 Pain in left knee: Secondary | ICD-10-CM

## 2018-01-18 DIAGNOSIS — M1711 Unilateral primary osteoarthritis, right knee: Secondary | ICD-10-CM | POA: Diagnosis not present

## 2018-01-18 DIAGNOSIS — M1712 Unilateral primary osteoarthritis, left knee: Secondary | ICD-10-CM | POA: Diagnosis not present

## 2018-01-18 MED ORDER — MELOXICAM 15 MG PO TABS: 15.0000 mg | ORAL_TABLET | Freq: Every day | ORAL | 0 refills | Status: DC

## 2018-01-18 MED FILL — MELOXICAM 15 MG TABLET: 15 | 30 days supply | Qty: 30 | Fill #0

## 2018-01-18 NOTE — Progress Notes (Signed)
Subjective:    Catherine [name] - 37 y.o. female MRN [id number]  Date of birth: Apr 26, 1981  HPI  Catherine [name] is here for bilateral knee pain. Reports left knee started hurting her about one week ago. Pain is located in the anterior to deep aspect of her knee. No inciting injury. Pain occurs after periods of walking or prolonged sitting/standing. Reports frequent popping sensation. No locking or giving way. Right knee has since become achy diffusely. She wonders if this is due to compensating for pain in her left knee by changing her gait. Denies edema, erythema or increased warmth of her knees. No weakness or numbness of LEs. She reports history of injury to left knee in 2010 when she fell off a [employer]. Was told she had bursitis of the knee. She went to PT and wore a brace provided by [facility].    -  reports that she quit smoking about 3 years ago. Her smoking use included cigarettes. She started smoking about 21 years ago. She has a 9.00 pack-year smoking history. She has never used smokeless tobacco. - Review of Systems: Per HPI. - Past Medical History: Patient Active Problem List   Diagnosis Date Noted  . Class 3 obesity with serious comorbidity and body mass index (BMI) of 60.0 to 69.9 in adult 06/10/2017  . Fluid retention 06/10/2017  . Menorrhagia 05/18/2017  . Sacral radiculopathy 02/04/2017  . Insulin resistance 02/02/2017  . At risk for violence to self related to depression 02/02/2017  . Morbid obesity (HCC) 01/01/2017  . Sore throat (viral) 11/21/2016  . Vitamin D deficiency 11/12/2016  . Prediabetes 11/12/2016  . Asthma 07/25/2016  . Abdominal pain 06/04/2016  . Otitis, externa, infective 06/04/2016  . Dysuria 03/20/2016  . Gout 11/21/2015  . Obesity, morbid, BMI 50 or higher (HCC) 05/14/2015  . BIPOLAR DISORDER UNSPECIFIED 11/22/2008  . Depression 12/10/2006  . MIGRAINE, UNSPEC., W/O INTRACTABLE MIGRAINE 12/10/2006  . GASTROESOPHAGEAL REFLUX, NO ESOPHAGITIS 12/10/2006    . OSA (obstructive sleep apnea) 12/10/2006   - Medications: reviewed and updated   Objective:   Physical Exam BP (!) 100/58   Pulse 86   Temp 98.3 F (36.8 C) (Oral)   Ht 5\' 7"  (1.702 m)   Wt (!) 427 lb (193.7 kg)   SpO2 98%   BMI 66.88 kg/m  Gen: NAD, alert, cooperative with exam, well-appearing Knees: No erythema or increased warmth of the knees bilaterally. No appreciable edema of the knees; however, difficult to assess due to body habitus. No TTP over the medial or lateral joint lines bilaterally. No pain with valgus or varus stress testing bilaterally. Ligaments seem to have stable endpoints. Strength 5/5 in LE bilaterally. Normal gait. Negative Thessaly's bilaterally.     Assessment & Plan:   1. Acute pain of both knees Most likely etiology is OA especially given BMI >65, diffuse achy pain, no inciting injury and early degenerative changes noted on left knee x-ray in 2011. Although exam is very limited due to body habitus, patient has no joint line tenderness or discomfort with valgus/varus stress testing making a meniscal injury less likely. Ligaments seem to have stable endpoints, no inciting injury, and bilateral presentation makes ligamentous tear less likely. No evidence of infectious or gouty etiology on exam.  - DG Knee Complete 4 Views Left; Future - DG Knee Complete 4 Views Right; Future - meloxicam (MOBIC) 15 MG tablet; Take 1 tablet (15 mg total) by mouth daily.  Dispense: 30 tablet; Refill: 0 -recommended  capsacin cream for local pain relief  -could consider PT  -if OA is etiology of knee pain, patient may benefit from CSI. Given significant body habitus, would refer to [facility] as may need US guided injections    Marcy Siren, D.O. 01/18/2018, 9:06 AM PGY-3, [facility]

## 2018-01-18 NOTE — Patient Instructions (Signed)
I suspect your knee pain is likely due to arthritis. I have ordered x-rays of both your knees and will call you with the results. I have sent an anti-inflammatory medication to your pharmacy. Take this once daily for two weeks and then as needed. I would also recommend trying Capsacin cream/ointment. You can buy this at the pharmacy and then rub on your knees. Wash your hands before touching your eyes or mouth because this can burn.   In the future, we could consider physical therapy and/or steroid injections for your knees. I would send you to Sports Medicine for the injections.

## 2018-01-18 NOTE — Telephone Encounter (Signed)
Attempted to call pt, no answer and no machine.  Will try later Fleeger, Catherine [name], CMA

## 2018-01-18 NOTE — Telephone Encounter (Signed)
-----   Message from [name], DO sent at 01/18/2018  4:04 PM EDT ----- Please call patient to let her know that x-rays showed advanced arthritis in her right knee and progression of the arthritis in the left knee. I recommend the Mobic and capsacin cream like we talked about today. If she wants to consider injections or PT I am happy to place those referrals.   [name], D.O. 01/18/2018, 4:04 PM PGY-3, [facility]

## 2018-01-21 ENCOUNTER — Ambulatory Visit (INDEPENDENT_AMBULATORY_CARE_PROVIDER_SITE_OTHER): Payer: 59 | Admitting: Family Medicine

## 2018-01-21 VITALS — BP 105/71 | HR 105 | Temp 97.6°F | Ht 67.0 in | Wt >= 6400 oz

## 2018-01-21 DIAGNOSIS — Z9189 Other specified personal risk factors, not elsewhere classified: Secondary | ICD-10-CM | POA: Diagnosis not present

## 2018-01-21 DIAGNOSIS — F3289 Other specified depressive episodes: Secondary | ICD-10-CM | POA: Diagnosis not present

## 2018-01-21 DIAGNOSIS — Z6841 Body Mass Index (BMI) 40.0 and over, adult: Secondary | ICD-10-CM

## 2018-01-21 MED ORDER — BUPROPION HCL ER (SR) 200 MG PO TB12: 200.0000 mg | ORAL_TABLET | Freq: Every day | ORAL | 0 refills | Status: DC

## 2018-01-21 MED FILL — BUPROPION HCL SR 200 MG TAB: 200 | 30 days supply | Qty: 30 | Fill #0

## 2018-01-21 NOTE — Progress Notes (Signed)
Office: [phone]  /  Fax: [phone]   HPI:   Chief Complaint: OBESITY Catherine [name] is here to discuss her progress with her obesity treatment plan. She is on the Category 3 plan and is following her eating plan approximately 85 % of the time. She states she is exercising 0 minutes 0 times per week. Catherine [name] is on Saxenda but has struggled to plan meals ahead. She notes increased emotional eating, worse in last 2 weeks. She is frustrated but ready to get back on track.  Her weight is (!) 426 lb (193.2 kg) today and has gained 6 pounds since her last visit. She has lost 42 lbs since starting treatment with [name].  Depression with emotional eating behaviors Catherine [name] notes increased emotional eating and decreased motivation to lose weight but wants to work on this. She did well on Wellbutrin in the past. Catherine [name] struggles with emotional eating and using food for comfort to the extent that it is negatively impacting her health. She often snacks when she is not hungry. Catherine [name] sometimes feels she is out of control and then feels guilty that she made poor food choices. She has been working on behavior modification techniques to help reduce her emotional eating and has been somewhat successful. She shows no sign of suicidal or homicidal ideations.  Depression screen [facility] 2/9 01/18/2018 10/28/2017 07/14/2017 05/18/2017 03/24/2017  Decreased Interest 0 0 0 0 0  Down, Depressed, Hopeless 0 0 0 0 0  PHQ - 2 Score 0 0 0 0 0  Altered sleeping - - - - -  Tired, decreased energy - - - - -  Change in appetite - - - - -  Feeling bad or failure about yourself  - - - - -  Trouble concentrating - - - - -  Moving slowly or fidgety/restless - - - - -  Suicidal thoughts - - - - -  PHQ-9 Score - - - - -  Some encounter information is confidential and restricted. Go to Review Flowsheets activity to see all data.   At risk for cardiovascular disease Catherine [name] is at a higher than average risk for cardiovascular disease due  to obesity. She currently denies any chest pain.  ALLERGIES: Allergies  Allergen Reactions  . Citrus Swelling    MEDICATIONS: Current Outpatient Medications on File Prior to Visit  Medication Sig Dispense Refill  . aspirin-acetaminophen-caffeine (EXCEDRIN MIGRAINE) 250-250-65 MG per tablet Take 2 tablets by mouth 2 (two) times daily as needed for migraine. For headache     . budesonide (PULMICORT) 180 MCG/ACT inhaler Inhale 1 puff into the lungs 2 (two) times daily. 1 Inhaler 1  . cetirizine (ZYRTEC) 10 MG tablet Take 10 mg by mouth daily.    . diclofenac (CATAFLAM) 50 MG tablet Take 50 mg by mouth 2 (two) times daily at 10 AM and 5 PM.    . diclofenac sodium (VOLTAREN) 1 % GEL Apply 2 g topically 4 (four) times daily. 100 g 1  . furosemide (LASIX) 20 MG tablet Take 1 tablet (20 mg total) by mouth 2 (two) times daily. 60 tablet 3  . gabapentin (NEURONTIN) 300 MG capsule TAKE 2 CAPSULES (600 MG TOTAL) BY MOUTH 3 TIMES A DAY 180 capsule 3  . ibuprofen (ADVIL,MOTRIN) 200 MG tablet Take 600-800 mg by mouth every 8 (eight) hours as needed (for pain.).     . Insulin Pen Needle (BD PEN NEEDLE NANO U/F) 32G X 4 MM MISC 1 Package by Does not apply route daily  at 12 noon. 100 each 0  . Liraglutide -Weight Management (SAXENDA) 18 MG/3ML SOPN Inject 3 mg into the skin daily. 5 pen 0  . meloxicam (MOBIC) 15 MG tablet Take 1 tablet (15 mg total) by mouth daily. 30 tablet 0  . polyethylene glycol powder (GLYCOLAX/MIRALAX) powder Take 17 g by mouth daily as needed. 3350 g 1  . ranitidine (ZANTAC) 150 MG tablet TAKE 1 TABLET BY MOUTH 2 TIMES DAILY. 60 tablet 0  . Vitamin D, Ergocalciferol, (DRISDOL) 50000 units CAPS capsule Take 1 capsule (50,000 Units total) by mouth every 7 (seven) days. 4 capsule 0   No current facility-administered medications on file prior to visit.     PAST MEDICAL HISTORY: Past Medical History:  Diagnosis Date  . Allergy   . Anxiety   . Arthritis   . Asthma    since baby,  seasonal  . ASTHMA, UNSPECIFIED 12/10/2006   Qualifier: Diagnosis of  By: Abundio Miu    . CELLULITIS AND ABSCESS OF LEG EXCEPT FOOT 12/30/2007   Qualifier: Diagnosis of  By: Daphine Deutscher FNP, Zena Amos    . Cyst of bone    right side of spine  . DEPENDENT EDEMA, LEGS, BILATERAL 11/22/2008   Qualifier: Diagnosis of  By: Barbaraann Barthel MD, [place]    . Depression 2003Dx  . Edema   . Food allergy    citris  . FOOT PAIN, RIGHT 11/22/2008   Qualifier: Diagnosis of  By: Barbaraann Barthel MD, [place]    . Gastric ulcer   . GERD (gastroesophageal reflux disease)   . Gout   . Hypertension    took Norvasc for 3 months, doctor discontinued no longer on BP medications  . Joint pain   . KNEE INJURY, LEFT 01/01/2009   Qualifier: Diagnosis of  By: Delrae Alfred MD, Lanora Manis    . Migraine   . ONYCHOMYCOSIS, TOENAILS 11/22/2008   Qualifier: Diagnosis of  By: Barbaraann Barthel MD, [place]    . Shortness of breath   . Sleep apnea   . TINEA PEDIS 11/22/2008   Qualifier: Diagnosis of  By: Barbaraann Barthel MD, Benetta Spar      PAST SURGICAL HISTORY: Past Surgical History:  Procedure Laterality Date  . DILITATION & CURRETTAGE/HYSTROSCOPY WITH NOVASURE ABLATION N/A 07/03/2017   Procedure: DILATATION & CURETTAGE/HYSTEROSCOPY WITH NOVASURE ABLATION;  Surgeon: Genia Del, MD;  Location: [facility];  Service: Gynecology;  Laterality: N/A;  . UPPER GI ENDOSCOPY    . WISDOM TOOTH EXTRACTION  2008   x4    SOCIAL HISTORY: Social History   Tobacco Use  . Smoking status: Former Smoker    Packs/day: 0.50    Years: 18.00    Pack years: 9.00    Types: Cigarettes    Start date: 10/13/1996    Last attempt to quit: 01/12/2015    Years since quitting: 3.0  . Smokeless tobacco: Never Used  Substance Use Topics  . Alcohol use: Yes    Alcohol/week: 0.6 oz    Types: 1 Standard drinks or equivalent per week    Comment: rarely drinks  . Drug use: No    Comment: marjuana - former use, quit 4 years ago    FAMILY HISTORY: Family History  Problem  Relation Age of Onset  . Hypertension Mother   . Hyperlipidemia Mother   . Cancer Mother        bone cancer  . Heart disease Mother   . Sudden death Mother   . Stroke Mother   . Thyroid disease Mother   .  Sleep apnea Mother   . Obesity Mother   . Sudden death Father   . Hypertension Father   . Heart disease Father   . Hyperlipidemia Father   . Prostate cancer Father   . Lung cancer Father   . Obesity Father   . Alcoholism Father   . Sudden death Brother   . Hypertension Brother   . Hyperlipidemia Brother   . Heart attack Brother   . Heart disease Brother   . Stroke Brother   . Alcoholism Brother   . Drug abuse Brother   . Heart disease Sister   . Hypertension Sister   . Thyroid cancer Sister   . Diabetes Neg Hx   . Colon cancer Neg Hx   . Stomach cancer Neg Hx   . Esophageal cancer Neg Hx   . Rectal cancer Neg Hx   . Liver cancer Neg Hx     ROS: Review of Systems  Constitutional: Negative for weight loss.  Cardiovascular: Negative for chest pain.  Psychiatric/Behavioral: Positive for depression. Negative for suicidal ideas.    PHYSICAL EXAM: Blood pressure 105/71, pulse (!) 105, temperature 97.6 F (36.4 C), temperature source Oral, height 5\' 7"  (1.702 m), weight (!) 426 lb (193.2 kg), SpO2 97 %. Body mass index is 66.72 kg/m. Physical Exam  Constitutional: She is oriented to person, place, and time. She appears well-developed and well-nourished.  Cardiovascular: Normal rate and regular rhythm.  Pulmonary/Chest: Effort normal.  Musculoskeletal: Normal range of motion.  Neurological: She is oriented to person, place, and time.  Skin: Skin is warm and dry.  Psychiatric: She has a normal mood and affect. Her behavior is normal.  Vitals reviewed.   RECENT LABS AND TESTS: BMET    Component Value Date/Time   NA 140 12/10/2017 1242   K 3.7 12/10/2017 1242   CL 105 12/10/2017 1242   CO2 19 (L) 12/10/2017 1242   GLUCOSE 83 12/10/2017 1242   GLUCOSE 83  06/04/2016 1155   BUN 11 12/10/2017 1242   CREATININE 0.81 12/10/2017 1242   CREATININE 0.90 06/04/2016 1155   CALCIUM 9.0 12/10/2017 1242   GFRNONAA 94 12/10/2017 1242   GFRNONAA 84 06/04/2016 1155   GFRAA 108 12/10/2017 1242   GFRAA >89 06/04/2016 1155   Lab Results  Component Value Date   HGBA1C 5.3 12/10/2017   HGBA1C 5.4 06/25/2017   HGBA1C 5.2 05/19/2017   HGBA1C 5.4 01/20/2017   HGBA1C 5.6 10/27/2016   Lab Results  Component Value Date   INSULIN 43.6 (H) 12/10/2017   INSULIN 30.4 (H) 06/25/2017   INSULIN 29.7 (H) 01/20/2017   INSULIN 30.1 (H) 10/27/2016   CBC    Component Value Date/Time   WBC 10.6 12/10/2017 1242   WBC 13.3 (H) 07/03/2017 0610   RBC 4.64 12/10/2017 1242   RBC 4.33 07/03/2017 0610   HGB 12.5 12/10/2017 1242   HCT 38.4 12/10/2017 1242   PLT 293 07/03/2017 0610   PLT 343 05/18/2017 1611   MCV 83 12/10/2017 1242   MCH 26.9 12/10/2017 1242   MCH 25.9 (L) 07/03/2017 0610   MCHC 32.6 12/10/2017 1242   MCHC 32.4 07/03/2017 0610   RDW 16.2 (H) 12/10/2017 1242   LYMPHSABS 5.2 (H) 12/10/2017 1242   MONOABS 0.6 07/03/2017 0610   EOSABS 0.0 12/10/2017 1242   BASOSABS 0.0 12/10/2017 1242   Iron/TIBC/Ferritin/ %Sat    Component Value Date/Time   IRON 25 (L) 05/18/2017 1611   TIBC 363 05/18/2017 1611   FERRITIN  16 05/18/2017 1611   IRONPCTSAT 7 (LL) 05/18/2017 1611   Lipid Panel     Component Value Date/Time   CHOL 152 10/27/2016 1058   TRIG 57 10/27/2016 1058   HDL 43 10/27/2016 1058   CHOLHDL 3.6 11/21/2015 1047   VLDL 10 11/21/2015 1047   LDLCALC 98 10/27/2016 1058   Hepatic Function Panel     Component Value Date/Time   PROT 7.2 12/10/2017 1242   ALBUMIN 4.2 12/10/2017 1242   AST 22 12/10/2017 1242   ALT 27 12/10/2017 1242   ALKPHOS 62 12/10/2017 1242   BILITOT 0.5 12/10/2017 1242      Component Value Date/Time   TSH 1.43 05/19/2017 1245   TSH 1.460 10/27/2016 1058   TSH 0.96 11/21/2015 1047    ASSESSMENT AND PLAN: Other  depression - with emotional eating  At risk for heart disease  Class 3 severe obesity with serious comorbidity and body mass index (BMI) of 60.0 to 69.9 in adult, unspecified obesity type (HCC)  PLAN:  Depression with Emotional Eating Behaviors We discussed behavior modification techniques today to help Catherine [name] deal with her emotional eating and depression. Catherine [name] agrees to restart Wellbutrin SR 200 mg q AM #30 with no refills. Catherine [name] agrees to follow up with our clinic in 2 to 3 weeks.  Cardiovascular risk counselling Catherine [name] was given extended (15 minutes) coronary artery disease prevention counseling today. She is 37 y.o. female and has risk factors for heart disease including obesity. We discussed intensive lifestyle modifications today with an emphasis on specific weight loss instructions and strategies. Pt was also informed of the importance of increasing exercise and decreasing saturated fats to help prevent heart disease.  Obesity Catherine [name] is currently in the action stage of change. As such, her goal is to continue with weight loss efforts She has agreed to follow the Category 3 plan Catherine [name] has been instructed to work up to a goal of 150 minutes of combined cardio and strengthening exercise per week for weight loss and overall health benefits. We discussed the following Behavioral Modification Strategies today: increasing lean protein intake, work on meal planning and easy cooking plans, avoiding temptations, and keeping healthy foods in the home   Catherine [name] has agreed to follow up with our clinic in 2 to 3 weeks. She was informed of the importance of frequent follow up visits to maximize her success with intensive lifestyle modifications for her multiple health conditions.   I, Burt KnackSharon Martin, am acting as transcriptionist for Quillian Quincearen Ciaran Begay, MD  I have reviewed the above documentation for accuracy and completeness, and I agree with the above. -Quillian Quincearen Mathilde Mcwherter, MD

## 2018-01-22 NOTE — Telephone Encounter (Signed)
Attempted to reach by phone again, still unable to reach.  Will mail letter. Fleeger, Maryjo RochesterJessica Dawn, CMA

## 2018-01-25 ENCOUNTER — Other Ambulatory Visit: Payer: Self-pay | Admitting: Internal Medicine

## 2018-01-25 DIAGNOSIS — M170 Bilateral primary osteoarthritis of knee: Secondary | ICD-10-CM

## 2018-01-25 NOTE — Telephone Encounter (Signed)
Pt received letter and is requesting a referral for injections as she has tried PT before. Chaden Doom, Maryjo RochesterJessica Dawn, CMA

## 2018-01-25 NOTE — Telephone Encounter (Signed)
I have placed the referral to Sports Medicine for patients osteoarthritis to discuss injections.   [name], D.O. 01/25/2018, 11:04 AM PGY-3, [facility]

## 2018-01-25 NOTE — Progress Notes (Signed)
I have placed the referral to Sports Medicine for patients osteoarthritis to discuss injections.   Akshat Minehart, D.O. 01/25/2018, 11:04 AM PGY-3, [facility] 

## 2018-01-25 NOTE — Progress Notes (Signed)
Pt informed. Catherine [name] Catherine [name] Catherine [name], CMA  

## 2018-02-03 ENCOUNTER — Other Ambulatory Visit: Payer: Self-pay

## 2018-02-03 DIAGNOSIS — M25562 Pain in left knee: Secondary | ICD-10-CM

## 2018-02-03 DIAGNOSIS — M25561 Pain in right knee: Secondary | ICD-10-CM

## 2018-02-03 DIAGNOSIS — R609 Edema, unspecified: Secondary | ICD-10-CM
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
## 2018-02-04 MED ORDER — BUPROPION HCL ER (SR) 200 MG PO TB12: 200.0000 mg | ORAL_TABLET | Freq: Every day | ORAL | 0 refills | Status: DC

## 2018-02-04 MED ORDER — MELOXICAM 15 MG PO TABS: 15.0000 mg | ORAL_TABLET | Freq: Every day | ORAL | 0 refills | Status: DC

## 2018-02-04 MED ORDER — GABAPENTIN 300 MG PO CAPS: ORAL_CAPSULE | ORAL | 3 refills | Status: DC

## 2018-02-04 MED ORDER — FUROSEMIDE 20 MG PO TABS: 20.0000 mg | ORAL_TABLET | Freq: Two times a day (BID) | ORAL | 3 refills | Status: DC

## 2018-02-04 MED FILL — BUPROPION HCL SR 200 MG TAB: 200 | 30 days supply | Qty: 30 | Fill #0

## 2018-02-04 MED FILL — MELOXICAM 15 MG TABLET: 15 | 30 days supply | Qty: 30 | Fill #0

## 2018-02-04 MED FILL — FUROSEMIDE 20 MG TABS: 20 | 30 days supply | Qty: 60 | Fill #0

## 2018-02-04 MED FILL — GABAPENTIN 300 MG CAPSULE: 300 | 30 days supply | Qty: 180 | Fill #0

## 2018-02-08 ENCOUNTER — Ambulatory Visit: Payer: 59 | Admitting: Sports Medicine

## 2018-02-08 ENCOUNTER — Encounter: Payer: Self-pay | Admitting: Sports Medicine

## 2018-02-08 ENCOUNTER — Ambulatory Visit (INDEPENDENT_AMBULATORY_CARE_PROVIDER_SITE_OTHER): Payer: 59 | Admitting: Physician Assistant

## 2018-02-08 VITALS — BP 133/86 | HR 86 | Ht 67.0 in | Wt >= 6400 oz

## 2018-02-08 VITALS — BP 96/68 | HR 94 | Temp 97.9°F | Ht 67.0 in | Wt >= 6400 oz

## 2018-02-08 DIAGNOSIS — M170 Bilateral primary osteoarthritis of knee: Secondary | ICD-10-CM | POA: Diagnosis not present

## 2018-02-08 DIAGNOSIS — E559 Vitamin D deficiency, unspecified: Secondary | ICD-10-CM

## 2018-02-08 DIAGNOSIS — Z6841 Body Mass Index (BMI) 40.0 and over, adult: Secondary | ICD-10-CM

## 2018-02-08 DIAGNOSIS — Z9189 Other specified personal risk factors, not elsewhere classified: Secondary | ICD-10-CM | POA: Diagnosis not present

## 2018-02-08 DIAGNOSIS — I9589 Other hypotension: Secondary | ICD-10-CM | POA: Diagnosis not present

## 2018-02-08 MED ORDER — VITAMIN D (ERGOCALCIFEROL) 1.25 MG (50000 UNIT) PO CAPS: 50000.0000 [IU] | ORAL_CAPSULE | ORAL | 0 refills | Status: DC
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
## 2018-02-09 NOTE — Progress Notes (Signed)
Office: [phone]  /  Fax: [phone]   HPI:   Chief Complaint: OBESITY Catherine [name] is here to discuss her progress with her obesity treatment plan. She is on the Category 3 plan and is following her eating plan approximately 80 % of the time. She states she is exercising 0 minutes 0 times per week. Catherine [name] continues to do well with weight loss. She would like more meal planning ideas.  Her weight is (!) 424 lb (192.3 kg) today and has had a weight loss of 2 pounds over a period of 2 to 3 weeks since her last visit. She has lost 44 lbs since starting treatment with [name].  Vitamin D Deficiency Catherine [name] has a diagnosis of vitamin D deficiency. She is currently taking prescription Vit D and denies nausea, vomiting or muscle weakness.  At risk for osteopenia and osteoporosis Catherine [name] is at higher risk of osteopenia and osteoporosis due to vitamin D deficiency.   Hypotension Catherine [name]'s blood [name] is low today. She is on Lasix 20 mg BID for edema. She is only taking one pill daily (instructions are to take twice daily, but only takes one). She denies chest pain or shortness of breath. She is working weight loss to help control her blood [name] with the goal of decreasing her risk of heart attack and stroke. Catherine [name] is not currently controlled.  ALLERGIES: Allergies  Allergen Reactions  . Citrus Swelling    MEDICATIONS: Current Outpatient Medications on File Prior to Visit  Medication Sig Dispense Refill  . aspirin-acetaminophen-caffeine (EXCEDRIN MIGRAINE) 250-250-65 MG per tablet Take 2 tablets by mouth 2 (two) times daily as needed for migraine. For headache     . budesonide (PULMICORT) 180 MCG/ACT inhaler Inhale 1 puff into the lungs 2 (two) times daily. 1 Inhaler 1  . buPROPion (WELLBUTRIN SR) 200 MG 12 hr tablet Take 1 tablet (200 mg total) by mouth daily at 12 noon. 30 tablet 0  . cetirizine (ZYRTEC) 10 MG tablet Take 10 mg by mouth daily.    . diclofenac  (CATAFLAM) 50 MG tablet Take 50 mg by mouth 2 (two) times daily at 10 AM and 5 PM.    . diclofenac sodium (VOLTAREN) 1 % GEL Apply 2 g topically 4 (four) times daily. 100 g 1  . furosemide (LASIX) 20 MG tablet Take 1 tablet (20 mg total) by mouth 2 (two) times daily. (Patient taking differently: Take 20 mg by mouth daily. ) 60 tablet 3  . gabapentin (NEURONTIN) 300 MG capsule TAKE 2 CAPSULES (600 MG TOTAL) BY MOUTH 3 TIMES A DAY 180 capsule 3  . ibuprofen (ADVIL,MOTRIN) 200 MG tablet Take 600-800 mg by mouth every 8 (eight) hours as needed (for pain.).     . Insulin Pen Needle (BD PEN NEEDLE NANO U/F) 32G X 4 MM MISC 1 Package by Does not apply route daily at 12 noon. 100 each 0  . Liraglutide -Weight Management (SAXENDA) 18 MG/3ML SOPN Inject 3 mg into the skin daily. 5 pen 0  . meloxicam (MOBIC) 15 MG tablet Take 1 tablet (15 mg total) by mouth daily. 30 tablet 0  . polyethylene glycol powder (GLYCOLAX/MIRALAX) powder Take 17 g by mouth daily as needed. 3350 g 1  . ranitidine (ZANTAC) 150 MG tablet TAKE 1 TABLET BY MOUTH 2 TIMES DAILY. 60 tablet 0   No current facility-administered medications on file prior to visit.     PAST MEDICAL HISTORY: Past Medical History:  Diagnosis Date  . Allergy   .  Anxiety   . Arthritis   . Asthma    since baby, seasonal  . ASTHMA, UNSPECIFIED 12/10/2006   Qualifier: Diagnosis of  By: Abundio Miu    . CELLULITIS AND ABSCESS OF LEG EXCEPT FOOT 12/30/2007   Qualifier: Diagnosis of  By: Daphine Deutscher FNP, Zena Amos    . Cyst of bone    right side of spine  . DEPENDENT EDEMA, LEGS, BILATERAL 11/22/2008   Qualifier: Diagnosis of  By: Barbaraann Barthel MD, [place]    . Depression 2003Dx  . Edema   . Food allergy    citris  . FOOT PAIN, RIGHT 11/22/2008   Qualifier: Diagnosis of  By: Barbaraann Barthel MD, [place]    . Gastric ulcer   . GERD (gastroesophageal reflux disease)   . Gout   . Hypertension    took Norvasc for 3 months, doctor discontinued no longer on BP medications   . Joint pain   . KNEE INJURY, LEFT 01/01/2009   Qualifier: Diagnosis of  By: Delrae Alfred MD, Lanora Manis    . Migraine   . ONYCHOMYCOSIS, TOENAILS 11/22/2008   Qualifier: Diagnosis of  By: Barbaraann Barthel MD, [place]    . Shortness of breath   . Sleep apnea   . TINEA PEDIS 11/22/2008   Qualifier: Diagnosis of  By: Barbaraann Barthel MD, Benetta Spar      PAST SURGICAL HISTORY: Past Surgical History:  Procedure Laterality Date  . DILITATION & CURRETTAGE/HYSTROSCOPY WITH NOVASURE ABLATION N/A 07/03/2017   Procedure: DILATATION & CURETTAGE/HYSTEROSCOPY WITH NOVASURE ABLATION;  Surgeon: Genia Del, MD;  Location: [facility];  Service: Gynecology;  Laterality: N/A;  . UPPER GI ENDOSCOPY    . WISDOM TOOTH EXTRACTION  2008   x4    SOCIAL HISTORY: Social History   Tobacco Use  . Smoking status: Former Smoker    Packs/day: 0.50    Years: 18.00    Pack years: 9.00    Types: Cigarettes    Start date: 10/13/1996    Last attempt to quit: 01/12/2015    Years since quitting: 3.0  . Smokeless tobacco: Never Used  Substance Use Topics  . Alcohol use: Yes    Alcohol/week: 0.6 oz    Types: 1 Standard drinks or equivalent per week    Comment: rarely drinks  . Drug use: No    Comment: marjuana - former use, quit 4 years ago    FAMILY HISTORY: Family History  Problem Relation Age of Onset  . Hypertension Mother   . Hyperlipidemia Mother   . Cancer Mother        bone cancer  . Heart disease Mother   . Sudden death Mother   . Stroke Mother   . Thyroid disease Mother   . Sleep apnea Mother   . Obesity Mother   . Sudden death Father   . Hypertension Father   . Heart disease Father   . Hyperlipidemia Father   . Prostate cancer Father   . Lung cancer Father   . Obesity Father   . Alcoholism Father   . Sudden death Brother   . Hypertension Brother   . Hyperlipidemia Brother   . Heart attack Brother   . Heart disease Brother   . Stroke Brother   . Alcoholism Brother   . Drug abuse Brother   . Heart  disease Sister   . Hypertension Sister   . Thyroid cancer Sister   . Diabetes Neg Hx   . Colon cancer Neg Hx   . Stomach cancer Neg Hx   .  Esophageal cancer Neg Hx   . Rectal cancer Neg Hx   . Liver cancer Neg Hx     ROS: Review of Systems  Constitutional: Positive for weight loss.  Respiratory: Negative for shortness of breath.   Cardiovascular: Negative for chest pain.  Gastrointestinal: Negative for nausea and vomiting.  Musculoskeletal:       Negative muscle weakness    PHYSICAL EXAM: Blood [name] 96/68, pulse 94, temperature 97.9 F (36.6 C), temperature source Oral, height  (1.702 m), weight (!) 424 lb (192.3 kg), SpO2 97 %. Body mass index is 66.41 kg/m. Physical Exam  Constitutional: She is oriented to person, place, and time. She appears well-developed and well-nourished.  Cardiovascular: Normal rate.  Pulmonary/Chest: Effort normal.  Musculoskeletal: Normal range of motion.  Neurological: She is oriented to person, place, and time.  Skin: Skin is warm and dry.  Psychiatric: She has a normal mood and affect. Her behavior is normal.  Vitals reviewed.   RECENT LABS AND TESTS: BMET    Component Value Date/Time   NA 140 12/10/2017 1242   K 3.7 12/10/2017 1242   CL 105 12/10/2017 1242   CO2 19 (L) 12/10/2017 1242   GLUCOSE 83 12/10/2017 1242   GLUCOSE 83 06/04/2016 1155   BUN 11 12/10/2017 1242   CREATININE 0.81 12/10/2017 1242   CREATININE 0.90 06/04/2016 1155   CALCIUM 9.0 12/10/2017 1242   GFRNONAA 94 12/10/2017 1242   GFRNONAA 84 06/04/2016 1155   GFRAA 108 12/10/2017 1242   GFRAA >89 06/04/2016 1155   Lab Results  Component Value Date   HGBA1C 5.3 12/10/2017   HGBA1C 5.4 06/25/2017   HGBA1C 5.2 05/19/2017   HGBA1C 5.4 01/20/2017   HGBA1C 5.6 10/27/2016   Lab Results  Component Value Date   INSULIN 43.6 (H) 12/10/2017   INSULIN 30.4 (H) 06/25/2017   INSULIN 29.7 (H) 01/20/2017   INSULIN 30.1 (H) 10/27/2016   CBC    Component  Value Date/Time   WBC 10.6 12/10/2017 1242   WBC 13.3 (H) 07/03/2017 0610   RBC 4.64 12/10/2017 1242   RBC 4.33 07/03/2017 0610   HGB 12.5 12/10/2017 1242   HCT 38.4 12/10/2017 1242   PLT 293 07/03/2017 0610   PLT 343 05/18/2017 1611   MCV 83 12/10/2017 1242   MCH 26.9 12/10/2017 1242   MCH 25.9 (L) 07/03/2017 0610   MCHC 32.6 12/10/2017 1242   MCHC 32.4 07/03/2017 0610   RDW 16.2 (H) 12/10/2017 1242   LYMPHSABS 5.2 (H) 12/10/2017 1242   MONOABS 0.6 07/03/2017 0610   EOSABS 0.0 12/10/2017 1242   BASOSABS 0.0 12/10/2017 1242   Iron/TIBC/Ferritin/ %Sat    Component Value Date/Time   IRON 25 (L) 05/18/2017 1611   TIBC 363 05/18/2017 1611   FERRITIN 16 05/18/2017 1611   IRONPCTSAT 7 (LL) 05/18/2017 1611   Lipid Panel     Component Value Date/Time   CHOL 152 10/27/2016 1058   TRIG 57 10/27/2016 1058   HDL 43 10/27/2016 1058   CHOLHDL 3.6 11/21/2015 1047   VLDL 10 11/21/2015 1047   LDLCALC 98 10/27/2016 1058   Hepatic Function Panel     Component Value Date/Time   PROT 7.2 12/10/2017 1242   ALBUMIN 4.2 12/10/2017 1242   AST 22 12/10/2017 1242   ALT 27 12/10/2017 1242   ALKPHOS 62 12/10/2017 1242   BILITOT 0.5 12/10/2017 1242      Component Value Date/Time   TSH 1.43 05/19/2017 1245   TSH 1.460  10/27/2016 1058   TSH 0.96 11/21/2015 1047  Results for TERREN, HABERLE (MRN [id number]) as of 02/09/2018 10:38  Ref. Range 12/10/2017 12:42  Vitamin D, 25-Hydroxy Latest Ref Range: 30.0 - 100.0 ng/mL 28.5 (L)    ASSESSMENT AND PLAN: Vitamin D deficiency - Plan: Vitamin D, Ergocalciferol, (DRISDOL) 50000 units CAPS capsule  Other specified hypotension  At risk for osteoporosis  Class 3 severe obesity with serious comorbidity and body mass index (BMI) of 60.0 to 69.9 in adult, unspecified obesity type (HCC)  PLAN:  Vitamin D Deficiency Tatelyn was informed that low vitamin D levels contributes to fatigue and are associated with obesity, breast, and colon cancer.  Catherine [name] agrees to continue taking prescription Vit D ,000 IU every week #4 and we will refill for 1 month. She will follow up for routine testing of vitamin D, at least 2-3 times per year. She was informed of the risk of over-replacement of vitamin D and agrees to not increase her dose unless she discusses this with [name] first. Catherine [name] agrees to follow up with our clinic in 2 weeks.  At risk for osteopenia and osteoporosis Catherine [name] is at risk for osteopenia and osteoporsis due to her vitamin D deficiency. She was encouraged to take her vitamin D and follow her higher calcium diet and increase strengthening exercise to help strengthen her bones and decrease her risk of osteopenia and osteoporosis.  Hypotension We discussed sodium restriction, working on healthy weight loss, and a regular exercise program as the means to achieve improved blood [name] control. Catherine [name] agreed with this plan and agreed to follow up as directed. We will continue to monitor her blood [name] as well as her progress with the above lifestyle modifications. She will continue her medications as prescribed and will watch for signs of hypotension as she continues her lifestyle modifications. Catherine [name] agrees to follow up with our clinic in 2 weeks.  Obesity Catherine [name] is currently in the action stage of change. As such, her goal is to continue with weight loss efforts She has agreed to follow the Category 3 plan Catherine [name] has been instructed to work up to a goal of 150 minutes of combined cardio and strengthening exercise per week for weight loss and overall health benefits. We discussed the following Behavioral Modification Strategies today: increasing lean protein intake and work on meal planning and easy cooking plans   Catherine [name] has agreed to follow up with our clinic in 2 weeks. She was informed of the importance of frequent follow up visits to maximize her success with intensive lifestyle modifications for her multiple health  conditions.   Trude Mcburney, am acting as transcriptionist for Illa Level, PA-C I, Illa Level [facility], have reviewed this note and agree with its content

## 2018-02-22 ENCOUNTER — Ambulatory Visit (INDEPENDENT_AMBULATORY_CARE_PROVIDER_SITE_OTHER): Payer: 59 | Admitting: Physician Assistant

## 2018-02-22 VITALS — BP 106/71 | HR 89 | Temp 98.6°F | Ht 67.0 in | Wt >= 6400 oz

## 2018-02-22 DIAGNOSIS — E559 Vitamin D deficiency, unspecified: Secondary | ICD-10-CM | POA: Diagnosis not present

## 2018-02-22 DIAGNOSIS — Z6841 Body Mass Index (BMI) 40.0 and over, adult: Secondary | ICD-10-CM

## 2018-02-22 MED ORDER — LIRAGLUTIDE -WEIGHT MANAGEMENT 18 MG/3ML ~~LOC~~ SOPN: 3.0000 mg | PEN_INJECTOR | Freq: Every day | SUBCUTANEOUS | 0 refills | Status: DC

## 2018-02-22 MED FILL — SAXENDA 18 MG/3 ML PEN: 18 | 30 days supply | Qty: 15 | Fill #0

## 2018-02-22 NOTE — Progress Notes (Signed)
Office: [phone]  /  Fax: [phone]   HPI:   Chief Complaint: OBESITY Clorinda is here to discuss her progress with her obesity treatment plan. She is on the Category 3 plan and is following her eating plan approximately 75 % of the time. She states she is exercising 0 minutes 0 times per week. Stormey maintained her weight. She states her hunger is well controlled. She noticed that she does not eat all her meals on the high dose Saxenda. Her weight is (!) 424 lb (192.3 kg) today and has maintained weight over a period of 2 weeks since her last visit. She has lost 44 lbs since starting treatment with [name].  Vitamin D deficiency Kinnedy has a diagnosis of vitamin D deficiency. She is currently taking vit D and denies nausea, vomiting or muscle weakness.   ALLERGIES: Allergies  Allergen Reactions  . Citrus Swelling    MEDICATIONS: Current Outpatient Medications on File Prior to Visit  Medication Sig Dispense Refill  . aspirin-acetaminophen-caffeine (EXCEDRIN MIGRAINE) 250-250-65 MG per tablet Take 2 tablets by mouth 2 (two) times daily as needed for migraine. For headache     . budesonide (PULMICORT) 180 MCG/ACT inhaler Inhale 1 puff into the lungs 2 (two) times daily. 1 Inhaler 1  . buPROPion (WELLBUTRIN SR) 200 MG 12 hr tablet Take 1 tablet (200 mg total) by mouth daily at 12 noon. 30 tablet 0  . cetirizine (ZYRTEC) 10 MG tablet Take 10 mg by mouth daily.    . diclofenac (CATAFLAM) 50 MG tablet Take 50 mg by mouth 2 (two) times daily at 10 AM and 5 PM.    . diclofenac sodium (VOLTAREN) 1 % GEL Apply 2 g topically 4 (four) times daily. 100 g 1  . furosemide (LASIX) 20 MG tablet Take 1 tablet (20 mg total) by mouth 2 (two) times daily. (Patient taking differently: Take 20 mg by mouth daily. ) 60 tablet 3  . gabapentin (NEURONTIN) 300 MG capsule TAKE 2 CAPSULES (600 MG TOTAL) BY MOUTH 3 TIMES A DAY 180 capsule 3  . ibuprofen (ADVIL,MOTRIN) 200 MG tablet Take 600-800 mg by mouth  every 8 (eight) hours as needed (for pain.).     . Insulin Pen Needle (BD PEN NEEDLE NANO U/F) 32G X 4 MM MISC 1 Package by Does not apply route daily at 12 noon. 100 each 0  . Liraglutide -Weight Management (SAXENDA) 18 MG/3ML SOPN Inject 3 mg into the skin daily. 5 pen 0  . meloxicam (MOBIC) 15 MG tablet Take 1 tablet (15 mg total) by mouth daily. 30 tablet 0  . polyethylene glycol powder (GLYCOLAX/MIRALAX) powder Take 17 g by mouth daily as needed. 3350 g 1  . ranitidine (ZANTAC) 150 MG tablet TAKE 1 TABLET BY MOUTH 2 TIMES DAILY. 60 tablet 0  . Vitamin D, Ergocalciferol, (DRISDOL) 50000 units CAPS capsule Take 1 capsule (50,000 Units total) by mouth every 7 (seven) days. 4 capsule 0   No current facility-administered medications on file prior to visit.     PAST MEDICAL HISTORY: Past Medical History:  Diagnosis Date  . Allergy   . Anxiety   . Arthritis   . Asthma    since baby, seasonal  . ASTHMA, UNSPECIFIED 12/10/2006   Qualifier: Diagnosis of  By: Abundio Miu    . CELLULITIS AND ABSCESS OF LEG EXCEPT FOOT 12/30/2007   Qualifier: Diagnosis of  By: Daphine Deutscher FNP, Zena Amos    . Cyst of bone    right side of  spine  . DEPENDENT EDEMA, LEGS, BILATERAL 11/22/2008   Qualifier: Diagnosis of  By: Barbaraann Barthel MD, [place]    . Depression 2003Dx  . Edema   . Food allergy    citris  . FOOT PAIN, RIGHT 11/22/2008   Qualifier: Diagnosis of  By: Barbaraann Barthel MD, [place]    . Gastric ulcer   . GERD (gastroesophageal reflux disease)   . Gout   . Hypertension    took Norvasc for 3 months, doctor discontinued no longer on BP medications  . Joint pain   . KNEE INJURY, LEFT 01/01/2009   Qualifier: Diagnosis of  By: Delrae Alfred MD, Lanora Manis    . Migraine   . ONYCHOMYCOSIS, TOENAILS 11/22/2008   Qualifier: Diagnosis of  By: Barbaraann Barthel MD, [place]    . Shortness of breath   . Sleep apnea   . TINEA PEDIS 11/22/2008   Qualifier: Diagnosis of  By: Barbaraann Barthel MD, Benetta Spar      PAST SURGICAL HISTORY: Past  Surgical History:  Procedure Laterality Date  . DILITATION & CURRETTAGE/HYSTROSCOPY WITH NOVASURE ABLATION N/A 07/03/2017   Procedure: DILATATION & CURETTAGE/HYSTEROSCOPY WITH NOVASURE ABLATION;  Surgeon: Genia Del, MD;  Location: [facility];  Service: Gynecology;  Laterality: N/A;  . UPPER GI ENDOSCOPY    . WISDOM TOOTH EXTRACTION  2008   x4    SOCIAL HISTORY: Social History   Tobacco Use  . Smoking status: Former Smoker    Packs/day: 0.50    Years: 18.00    Pack years: 9.00    Types: Cigarettes    Start date: 10/13/1996    Last attempt to quit: 01/12/2015    Years since quitting: 3.1  . Smokeless tobacco: Never Used  Substance Use Topics  . Alcohol use: Yes    Alcohol/week: 0.6 oz    Types: 1 Standard drinks or equivalent per week    Comment: rarely drinks  . Drug use: No    Comment: marjuana - former use, quit 4 years ago    FAMILY HISTORY: Family History  Problem Relation Age of Onset  . Hypertension Mother   . Hyperlipidemia Mother   . Cancer Mother        bone cancer  . Heart disease Mother   . Sudden death Mother   . Stroke Mother   . Thyroid disease Mother   . Sleep apnea Mother   . Obesity Mother   . Sudden death Father   . Hypertension Father   . Heart disease Father   . Hyperlipidemia Father   . Prostate cancer Father   . Lung cancer Father   . Obesity Father   . Alcoholism Father   . Sudden death Brother   . Hypertension Brother   . Hyperlipidemia Brother   . Heart attack Brother   . Heart disease Brother   . Stroke Brother   . Alcoholism Brother   . Drug abuse Brother   . Heart disease Sister   . Hypertension Sister   . Thyroid cancer Sister   . Diabetes Neg Hx   . Colon cancer Neg Hx   . Stomach cancer Neg Hx   . Esophageal cancer Neg Hx   . Rectal cancer Neg Hx   . Liver cancer Neg Hx     ROS: Review of Systems  Constitutional: Negative for weight loss.  Gastrointestinal: Negative for nausea and vomiting.  Musculoskeletal:        Negative for muscle weakness    PHYSICAL EXAM: Blood pressure 106/71, pulse 89, temperature 98.6  F (37 C), temperature source Oral, height  (1.702 m), weight (!) 424 lb (192.3 kg), SpO2 99 %. Body mass index is 66.41 kg/m. Physical Exam  Constitutional: She is oriented to person, place, and time. She appears well-developed and well-nourished.  Cardiovascular: Normal rate.  Pulmonary/Chest: Effort normal.  Musculoskeletal: Normal range of motion.  Neurological: She is oriented to person, place, and time.  Skin: Skin is warm and dry.  Psychiatric: She has a normal mood and affect. Her behavior is normal.  Vitals reviewed.   RECENT LABS AND TESTS: BMET    Component Value Date/Time   NA 140 12/10/2017 1242   K 3.7 12/10/2017 1242   CL 105 12/10/2017 1242   CO2 19 (L) 12/10/2017 1242   GLUCOSE 83 12/10/2017 1242   GLUCOSE 83 06/04/2016 1155   BUN 11 12/10/2017 1242   CREATININE 0.81 12/10/2017 1242   CREATININE 0.90 06/04/2016 1155   CALCIUM 9.0 12/10/2017 1242   GFRNONAA 94 12/10/2017 1242   GFRNONAA 84 06/04/2016 1155   GFRAA 108 12/10/2017 1242   GFRAA >89 06/04/2016 1155   Lab Results  Component Value Date   HGBA1C 5.3 12/10/2017   HGBA1C 5.4 06/25/2017   HGBA1C 5.2 05/19/2017   HGBA1C 5.4 01/20/2017   HGBA1C 5.6 10/27/2016   Lab Results  Component Value Date   INSULIN 43.6 (H) 12/10/2017   INSULIN 30.4 (H) 06/25/2017   INSULIN 29.7 (H) 01/20/2017   INSULIN 30.1 (H) 10/27/2016   CBC    Component Value Date/Time   WBC 10.6 12/10/2017 1242   WBC 13.3 (H) 07/03/2017 0610   RBC 4.64 12/10/2017 1242   RBC 4.33 07/03/2017 0610   HGB 12.5 12/10/2017 1242   HCT 38.4 12/10/2017 1242   PLT 293 07/03/2017 0610   PLT 343 05/18/2017 1611   MCV 83 12/10/2017 1242   MCH 26.9 12/10/2017 1242   MCH 25.9 (L) 07/03/2017 0610   MCHC 32.6 12/10/2017 1242   MCHC 32.4 07/03/2017 0610   RDW 16.2 (H) 12/10/2017 1242   LYMPHSABS 5.2 (H) 12/10/2017 1242   MONOABS  0.6 07/03/2017 0610   EOSABS 0.0 12/10/2017 1242   BASOSABS 0.0 12/10/2017 1242   Iron/TIBC/Ferritin/ %Sat    Component Value Date/Time   IRON 25 (L) 05/18/2017 1611   TIBC 363 05/18/2017 1611   FERRITIN 16 05/18/2017 1611   IRONPCTSAT 7 (LL) 05/18/2017 1611   Lipid Panel     Component Value Date/Time   CHOL 152 10/27/2016 1058   TRIG 57 10/27/2016 1058   HDL 43 10/27/2016 1058   CHOLHDL 3.6 11/21/2015 1047   VLDL 10 11/21/2015 1047   LDLCALC 98 10/27/2016 1058   Hepatic Function Panel     Component Value Date/Time   PROT 7.2 12/10/2017 1242   ALBUMIN 4.2 12/10/2017 1242   AST 22 12/10/2017 1242   ALT 27 12/10/2017 1242   ALKPHOS 62 12/10/2017 1242   BILITOT 0.5 12/10/2017 1242      Component Value Date/Time   TSH 1.43 05/19/2017 1245   TSH 1.460 10/27/2016 1058   TSH 0.96 11/21/2015 1047   Results for VITA, CURRIN (MRN [id number]) as of 02/22/2018 17:32  Ref. Range 12/10/2017 12:42  Vitamin D, 25-Hydroxy Latest Ref Range: 30.0 - 100.0 ng/mL 28.5 (L)   ASSESSMENT AND PLAN: Vitamin D deficiency  Class 3 severe obesity with serious comorbidity and body mass index (BMI) of 60.0 to 69.9 in adult, unspecified obesity type (HCC) - Plan: Liraglutide -Weight Management (SAXENDA)  18 MG/3ML SOPN  PLAN:  Vitamin D Deficiency Haddy was informed that low vitamin D levels contributes to fatigue and are associated with obesity, breast, and colon cancer. She agrees to continue to take prescription Vit D ,000 IU every week and will follow up for routine testing of vitamin D, at least 2-3 times per year. She was informed of the risk of over-replacement of vitamin D and agrees to not increase her dose unless she discusses this with [name] first.  We spent > than 50% of the 15 minute visit on the counseling as documented in the note.  Obesity Teleah is currently in the action stage of change. As such, her goal is to continue with weight loss efforts She has agreed to follow  the Category 3 plan Edy has been instructed to work up to a goal of 150 minutes of combined cardio and strengthening exercise per week for weight loss and overall health benefits. We discussed the following Behavioral Modification Strategies today: increasing lean protein intake and work on meal planning and easy cooking plans  We discussed various medication options to help Valita with her weight loss efforts and we both agreed to continue with Saxenda #5 pens with no refills (patient is to reduce dose to 2.4 mg).  Melanye has agreed to follow up with our clinic in 2 weeks. She was informed of the importance of frequent follow up visits to maximize her success with intensive lifestyle modifications for her multiple health conditions.   Cristi Loron, am acting as transcriptionist for [employer], PA-C I, Illa Level [facility], have reviewed this note and agree with its content

## 2018-02-24 MED FILL — VIT D2 1.25 MG (50,000 UNIT: 1.25 MG | 28 days supply | Qty: 4 | Fill #0

## 2018-02-25 ENCOUNTER — Ambulatory Visit: Payer: 59 | Admitting: Physical Therapy

## 2018-03-11 ENCOUNTER — Ambulatory Visit: Payer: 59 | Admitting: Sports Medicine

## 2018-03-11 ENCOUNTER — Ambulatory Visit (INDEPENDENT_AMBULATORY_CARE_PROVIDER_SITE_OTHER): Payer: 59 | Admitting: Physician Assistant

## 2018-03-11 VITALS — BP 138/67 | Ht 67.0 in | Wt >= 6400 oz

## 2018-03-11 VITALS — BP 103/70 | HR 104 | Temp 98.1°F | Ht 67.0 in | Wt >= 6400 oz

## 2018-03-11 DIAGNOSIS — Z6841 Body Mass Index (BMI) 40.0 and over, adult: Secondary | ICD-10-CM | POA: Diagnosis not present

## 2018-03-11 DIAGNOSIS — F3289 Other specified depressive episodes: Secondary | ICD-10-CM

## 2018-03-11 DIAGNOSIS — E559 Vitamin D deficiency, unspecified: Secondary | ICD-10-CM

## 2018-03-11 DIAGNOSIS — M170 Bilateral primary osteoarthritis of knee: Secondary | ICD-10-CM

## 2018-03-11 DIAGNOSIS — Z9189 Other specified personal risk factors, not elsewhere classified: Secondary | ICD-10-CM | POA: Diagnosis not present

## 2018-03-11 MED ORDER — BUPROPION HCL ER (SR) 200 MG PO TB12: 200.0000 mg | ORAL_TABLET | Freq: Every day | ORAL | 0 refills | Status: DC

## 2018-03-11 MED ORDER — VITAMIN D (ERGOCALCIFEROL) 1.25 MG (50000 UNIT) PO CAPS: 50000.0000 [IU] | ORAL_CAPSULE | ORAL | 0 refills | Status: DC

## 2018-03-11 MED FILL — BUPROPION HCL SR 200 MG TAB: 200 | 30 days supply | Qty: 30 | Fill #0

## 2018-03-11 NOTE — Progress Notes (Signed)
   Subjective:    Patient ID: Catherine [name], female    DOB: [birth year], 37 y.o.   MRN: [id number]  HPI   Patient comes in today for follow-up on bilateral knee pain. She has not yet started physical therapy. Her pain is about the same. She has not noticed any improvement in her symptoms with meloxicam but does believe that the Voltaren gel has been helpful. Previous x-rays demonstrate advanced knee osteoarthritis for her age.   Review of Systems    as above Objective:   Physical Exam  Morbidly obese. No acute distress. Awake alert and oriented 3. Vital signs reviewed  Examination of her knees is limited by body habitus. Range of motion is 0-100. Difficult to tell whether an effusion is present. Patient is tender to palpation along medial joint lines bilaterally. Knees are grossly stable ligamentous exam.      Assessment & Plan:   Bilateral knee pain secondary to advanced DJD Morbid obesity  Patient has rescheduled her first physical therapy visit for the middle of June. I would like for her to start PT and follow-up with me again in 4 weeks. In the meantime, we did teach her a simple isometric quad exercise with instructions to start performing this daily while waiting to get into formal PT. She will discontinue her meloxicam since it is not helping but she can continue with her Voltaren gel as needed. I've also encouraged her to continue with weight loss.

## 2018-03-11 NOTE — Progress Notes (Signed)
Office: [phone]  /  Fax: [phone]   HPI:   Chief Complaint: OBESITY Catherine [name] is here to discuss her progress with her obesity treatment plan. She is on the Category 3 plan and is following her eating plan approximately 80 % of the time. She states she is exercising 0 minutes 0 times per week. Catherine [name] continues to do well with weight loss. She is mindful of her eating and eats the recommended protein on the meal plan.  Her weight is (!) 418 lb (189.6 kg) today and has had a weight loss of 6 pounds over a period of 2 to 3 weeks since her last visit. She has lost 50 lbs since starting treatment with [name].  Vitamin D Deficiency Catherine [name] has a diagnosis of vitamin D deficiency. She is currently taking prescription Vit D and denies nausea, vomiting or muscle weakness.  At risk for osteopenia and osteoporosis Catherine [name] is at higher risk of osteopenia and osteoporosis due to vitamin D deficiency.   Depression with emotional eating behaviors Catherine [name] is struggling with emotional eating and using food for comfort to the extent that it is negatively impacting her health. She often snacks when she is not hungry. Catherine [name] sometimes feels she is out of control and then feels guilty that she made poor food choices. She has been working on behavior modification techniques to help reduce her emotional eating and has been somewhat successful. Her mood is stable and she shows no sign of suicidal or homicidal ideations.  Depression screen Catherine [name] 2/9 01/18/2018 10/28/2017 07/14/2017 05/18/2017 03/24/2017  Decreased Interest 0 0 0 0 0  Down, Depressed, Hopeless 0 0 0 0 0  PHQ - 2 Score 0 0 0 0 0  Altered sleeping - - - - -  Tired, decreased energy - - - - -  Change in appetite - - - - -  Feeling bad or failure about yourself  - - - - -  Trouble concentrating - - - - -  Moving slowly or fidgety/restless - - - - -  Suicidal thoughts - - - - -  PHQ-9 Score - - - - -  Some encounter information is confidential and  restricted. Go to Review Flowsheets activity to see all data.    ALLERGIES: Allergies  Allergen Reactions  . Citrus Swelling    MEDICATIONS: Current Outpatient Medications on File Prior to Visit  Medication Sig Dispense Refill  . aspirin-acetaminophen-caffeine (EXCEDRIN MIGRAINE) 250-250-65 MG per tablet Take 2 tablets by mouth 2 (two) times daily as needed for migraine. For headache     . budesonide (PULMICORT) 180 MCG/ACT inhaler Inhale 1 puff into the lungs 2 (two) times daily. 1 Inhaler 1  . cetirizine (ZYRTEC) 10 MG tablet Take 10 mg by mouth daily.    . diclofenac (CATAFLAM) 50 MG tablet Take 50 mg by mouth 2 (two) times daily at 10 AM and 5 PM.    . diclofenac sodium (VOLTAREN) 1 % GEL Apply 2 g topically 4 (four) times daily. 100 g 1  . furosemide (LASIX) 20 MG tablet Take 1 tablet (20 mg total) by mouth 2 (two) times daily. (Patient taking differently: Take 20 mg by mouth daily. ) 60 tablet 3  . gabapentin (NEURONTIN) 300 MG capsule TAKE 2 CAPSULES (600 MG TOTAL) BY MOUTH 3 TIMES A DAY 180 capsule 3  . ibuprofen (ADVIL,MOTRIN) 200 MG tablet Take 600-800 mg by mouth every 8 (eight) hours as needed (for pain.).     . Insulin Pen Needle (  BD PEN NEEDLE NANO U/F) 32G X 4 MM MISC 1 Package by Does not apply route daily at 12 noon. 100 each 0  . Liraglutide -Weight Management (Catherine [name]) 18 MG/3ML SOPN Inject 3 mg into the skin daily. 5 pen 0  . meloxicam (MOBIC) 15 MG tablet Take 1 tablet (15 mg total) by mouth daily. 30 tablet 0  . polyethylene glycol powder (GLYCOLAX/MIRALAX) powder Take 17 g by mouth daily as needed. 3350 g 1  . ranitidine (ZANTAC) 150 MG tablet TAKE 1 TABLET BY MOUTH 2 TIMES DAILY. 60 tablet 0   No current facility-administered medications on file prior to visit.     PAST MEDICAL HISTORY: Past Medical History:  Diagnosis Date  . Allergy   . Anxiety   . Arthritis   . Asthma    since baby, seasonal  . ASTHMA, UNSPECIFIED 12/10/2006   Qualifier: Diagnosis of   By: Abundio Miu    . CELLULITIS AND ABSCESS OF LEG EXCEPT FOOT 12/30/2007   Qualifier: Diagnosis of  By: Daphine Deutscher FNP, Zena Amos    . Cyst of bone    right side of spine  . DEPENDENT EDEMA, LEGS, BILATERAL 11/22/2008   Qualifier: Diagnosis of  By: Barbaraann Barthel MD, [place]    . Depression 2003Dx  . Edema   . Food allergy    citris  . FOOT PAIN, RIGHT 11/22/2008   Qualifier: Diagnosis of  By: Barbaraann Barthel MD, [place]    . Gastric ulcer   . GERD (gastroesophageal reflux disease)   . Gout   . Hypertension    took Norvasc for 3 months, doctor discontinued no longer on BP medications  . Joint pain   . KNEE INJURY, LEFT 01/01/2009   Qualifier: Diagnosis of  By: Delrae Alfred MD, Lanora Manis    . Migraine   . ONYCHOMYCOSIS, TOENAILS 11/22/2008   Qualifier: Diagnosis of  By: Barbaraann Barthel MD, [place]    . Shortness of breath   . Sleep apnea   . TINEA PEDIS 11/22/2008   Qualifier: Diagnosis of  By: Barbaraann Barthel MD, Benetta Spar      PAST SURGICAL HISTORY: Past Surgical History:  Procedure Laterality Date  . DILITATION & CURRETTAGE/HYSTROSCOPY WITH NOVASURE ABLATION N/A 07/03/2017   Procedure: DILATATION & CURETTAGE/HYSTEROSCOPY WITH NOVASURE ABLATION;  Surgeon: Genia Del, MD;  Location: [facility];  Service: Gynecology;  Laterality: N/A;  . UPPER GI ENDOSCOPY    . WISDOM TOOTH EXTRACTION  2008   x4    SOCIAL HISTORY: Social History   Tobacco Use  . Smoking status: Former Smoker    Packs/day: 0.50    Years: 18.00    Pack years: 9.00    Types: Cigarettes    Start date: 10/13/1996    Last attempt to quit: 01/12/2015    Years since quitting: 3.1  . Smokeless tobacco: Never Used  Substance Use Topics  . Alcohol use: Yes    Alcohol/week: 0.6 oz    Types: 1 Standard drinks or equivalent per week    Comment: rarely drinks  . Drug use: No    Comment: marjuana - former use, quit 4 years ago    FAMILY HISTORY: Family History  Problem Relation Age of Onset  . Hypertension Mother   . Hyperlipidemia Mother    . Cancer Mother        bone cancer  . Heart disease Mother   . Sudden death Mother   . Stroke Mother   . Thyroid disease Mother   . Sleep apnea Mother   . Obesity  Mother   . Sudden death Father   . Hypertension Father   . Heart disease Father   . Hyperlipidemia Father   . Prostate cancer Father   . Lung cancer Father   . Obesity Father   . Alcoholism Father   . Sudden death Brother   . Hypertension Brother   . Hyperlipidemia Brother   . Heart attack Brother   . Heart disease Brother   . Stroke Brother   . Alcoholism Brother   . Drug abuse Brother   . Heart disease Sister   . Hypertension Sister   . Thyroid cancer Sister   . Diabetes Neg Hx   . Colon cancer Neg Hx   . Stomach cancer Neg Hx   . Esophageal cancer Neg Hx   . Rectal cancer Neg Hx   . Liver cancer Neg Hx     ROS: Review of Systems  Constitutional: Positive for weight loss.  Cardiovascular: Negative for palpitations.  Gastrointestinal: Negative for abdominal pain, diarrhea, nausea and vomiting.  Musculoskeletal:       Negative muscle weakness  Psychiatric/Behavioral: Positive for depression. Negative for suicidal ideas.    PHYSICAL EXAM: Blood pressure 103/70, pulse (!) 104, temperature 98.1 F (36.7 C), temperature source Oral, height  (1.702 m), weight (!) 418 lb (189.6 kg), SpO2 97 %. Body mass index is 65.47 kg/m. Physical Exam  Constitutional: She is oriented to person, place, and time. She appears well-developed and well-nourished.  Cardiovascular: Tachycardia present.  Pulmonary/Chest: Effort normal.  Musculoskeletal: Normal range of motion.  Neurological: She is oriented to person, place, and time.  Skin: Skin is warm and dry.  Psychiatric: She has a normal mood and affect. Her behavior is normal.  Vitals reviewed.   RECENT LABS AND TESTS: BMET    Component Value Date/Time   NA 140 12/10/2017 1242   K 3.7 12/10/2017 1242   CL 105 12/10/2017 1242   CO2 19 (L) 12/10/2017 1242    GLUCOSE 83 12/10/2017 1242   GLUCOSE 83 06/04/2016 1155   BUN 11 12/10/2017 1242   CREATININE 0.81 12/10/2017 1242   CREATININE 0.90 06/04/2016 1155   CALCIUM 9.0 12/10/2017 1242   GFRNONAA 94 12/10/2017 1242   GFRNONAA 84 06/04/2016 1155   GFRAA 108 12/10/2017 1242   GFRAA >89 06/04/2016 1155   Lab Results  Component Value Date   HGBA1C 5.3 12/10/2017   HGBA1C 5.4 06/25/2017   HGBA1C 5.2 05/19/2017   HGBA1C 5.4 01/20/2017   HGBA1C 5.6 10/27/2016   Lab Results  Component Value Date   INSULIN 43.6 (H) 12/10/2017   INSULIN 30.4 (H) 06/25/2017   INSULIN 29.7 (H) 01/20/2017   INSULIN 30.1 (H) 10/27/2016   CBC    Component Value Date/Time   WBC 10.6 12/10/2017 1242   WBC 13.3 (H) 07/03/2017 0610   RBC 4.64 12/10/2017 1242   RBC 4.33 07/03/2017 0610   HGB 12.5 12/10/2017 1242   HCT 38.4 12/10/2017 1242   PLT 293 07/03/2017 0610   PLT 343 05/18/2017 1611   MCV 83 12/10/2017 1242   MCH 26.9 12/10/2017 1242   MCH 25.9 (L) 07/03/2017 0610   MCHC 32.6 12/10/2017 1242   MCHC 32.4 07/03/2017 0610   RDW 16.2 (H) 12/10/2017 1242   LYMPHSABS 5.2 (H) 12/10/2017 1242   MONOABS 0.6 07/03/2017 0610   EOSABS 0.0 12/10/2017 1242   BASOSABS 0.0 12/10/2017 1242   Iron/TIBC/Ferritin/ %Sat    Component Value Date/Time   IRON 25 (L) 05/18/2017  1611   TIBC 363 05/18/2017 1611   FERRITIN 16 05/18/2017 1611   IRONPCTSAT 7 (LL) 05/18/2017 1611   Lipid Panel     Component Value Date/Time   CHOL 152 10/27/2016 1058   TRIG 57 10/27/2016 1058   HDL 43 10/27/2016 1058   CHOLHDL 3.6 11/21/2015 1047   VLDL 10 11/21/2015 1047   LDLCALC 98 10/27/2016 1058   Hepatic Function Panel     Component Value Date/Time   PROT 7.2 12/10/2017 1242   ALBUMIN 4.2 12/10/2017 1242   AST 22 12/10/2017 1242   ALT 27 12/10/2017 1242   ALKPHOS 62 12/10/2017 1242   BILITOT 0.5 12/10/2017 1242      Component Value Date/Time   TSH 1.43 05/19/2017 1245   TSH 1.460 10/27/2016 1058   TSH 0.96  11/21/2015 1047  Results for DEYONA, SOZA (MRN [id number]) as of 03/12/2018 07:32  Ref. Range 12/10/2017 12:42  Vitamin D, 25-Hydroxy Latest Ref Range: 30.0 - 100.0 ng/mL 28.5 (L)    ASSESSMENT AND PLAN: Vitamin D deficiency - Plan: Vitamin D, Ergocalciferol, (DRISDOL) 50000 units CAPS capsule  Other depression - with emotional eating  At risk for osteoporosis  Class 3 severe obesity with serious comorbidity and body mass index (BMI) of 60.0 to 69.9 in adult, unspecified obesity type (HCC)  PLAN:  Vitamin D Deficiency Catherine [name] was informed that low vitamin D levels contributes to fatigue and are associated with obesity, breast, and colon cancer. Catherine [name] agrees to continue taking prescription Vit D ,000 IU every week #4 and we will refill for 1 month. She will follow up for routine testing of vitamin D, at least 2-3 times per year. She was informed of the risk of over-replacement of vitamin D and agrees to not increase her dose unless she discusses this with [name] first. Catherine [name] agrees to follow up with our clinic in 2 weeks.  At risk for osteopenia and osteoporosis Catherine [name] is at risk for osteopenia and osteoporsis due to her vitamin D deficiency. She was encouraged to take her vitamin D and follow her higher calcium diet and increase strengthening exercise to help strengthen her bones and decrease her risk of osteopenia and osteoporosis.  Depression with Emotional Eating Behaviors We discussed behavior modification techniques today to help Catherine [name] deal with her emotional eating and depression. Catherine [name] agrees to continue taking Wellbutrin SR 200 mg qd #30 and we will refill for 1 month. Catherine [name] agrees to follow up with our clinic in 2 weeks.  Obesity Catherine [name] is currently in the action stage of change. As such, her goal is to continue with weight loss efforts She has agreed to follow the Category 3 plan Catherine [name] has been instructed to work up to a goal of 150 minutes of combined cardio  and strengthening exercise per week for weight loss and overall health benefits. We discussed the following Behavioral Modification Strategies today: increasing lean protein intake and work on meal planning and easy cooking plans Catherine [name] is currently on Catherine [name], her HR is elevated today at 104 but she denies any other symptoms such as palpitations, abdominal pain, or N/V/Diarrhea.  Catherine [name] has agreed to follow up with our clinic in 2 weeks. She was informed of the importance of frequent follow up visits to maximize her success with intensive lifestyle modifications for her multiple health conditions.   Trude Mcburney, am acting as transcriptionist for Illa Level, PA-C I, Illa Level [facility], have reviewed this note and agree with its content

## 2018-03-15 ENCOUNTER — Other Ambulatory Visit: Payer: Self-pay | Admitting: Internal Medicine

## 2018-03-15 DIAGNOSIS — M25561 Pain in right knee: Secondary | ICD-10-CM

## 2018-03-15 DIAGNOSIS — M25562 Pain in left knee: Secondary | ICD-10-CM

## 2018-03-15 MED FILL — FUROSEMIDE 20 MG TABS: 20 | 30 days supply | Qty: 60 | Fill #1

## 2018-03-15 MED FILL — MELOXICAM 15 MG TABLET: 15 | 30 days supply | Qty: 30 | Fill #0

## 2018-03-15 MED FILL — raNITIdine HCL 150 MG TABS: 150 | 30 days supply | Qty: 60 | Fill #0

## 2018-03-25 ENCOUNTER — Ambulatory Visit: Payer: 59 | Attending: Sports Medicine | Admitting: Physical Therapy

## 2018-03-25 ENCOUNTER — Encounter: Payer: Self-pay | Admitting: Physical Therapy

## 2018-03-25 DIAGNOSIS — R262 Difficulty in walking, not elsewhere classified: Secondary | ICD-10-CM | POA: Insufficient documentation

## 2018-03-25 DIAGNOSIS — M25562 Pain in left knee: Secondary | ICD-10-CM | POA: Insufficient documentation

## 2018-03-25 DIAGNOSIS — M25662 Stiffness of left knee, not elsewhere classified: Secondary | ICD-10-CM | POA: Insufficient documentation

## 2018-03-25 DIAGNOSIS — M25661 Stiffness of right knee, not elsewhere classified: Secondary | ICD-10-CM | POA: Diagnosis present

## 2018-03-25 DIAGNOSIS — G8929 Other chronic pain: Secondary | ICD-10-CM | POA: Diagnosis present

## 2018-03-25 DIAGNOSIS — M25561 Pain in right knee: Secondary | ICD-10-CM | POA: Insufficient documentation

## 2018-03-25 MED FILL — GABAPENTIN 300 MG CAPSULE: 300 | 30 days supply | Qty: 180 | Fill #1

## 2018-03-25 NOTE — Therapy (Signed)
[facility] [address] [place], [place], [address] Phone: [phone]   Fax:  [phone]  Physical Therapy Evaluation  Patient Details  Name: Catherine [name] MRN: [id number] Date of Birth: Mar 15, 1981 Referring Provider: Dr Reino Bellis    Encounter Date: 03/25/2018  PT End of Session - 03/25/18 1516    Visit Number  1    Number of Visits  16    Date for PT Re-Evaluation  05/20/18    Authorization Type  [facility]     PT Start Time  0801    PT Stop Time  0845    PT Time Calculation (min)  44 min    Activity Tolerance  Patient tolerated treatment well    Behavior During Therapy  [facility] for tasks assessed/performed       Past Medical History:  Diagnosis Date  . Allergy   . Anxiety   . Arthritis   . Asthma    since baby, seasonal  . ASTHMA, UNSPECIFIED 12/10/2006   Qualifier: Diagnosis of  By: Abundio Miu    . CELLULITIS AND ABSCESS OF LEG EXCEPT FOOT 12/30/2007   Qualifier: Diagnosis of  By: Daphine Deutscher FNP, Zena Amos    . Cyst of bone    right side of spine  . DEPENDENT EDEMA, LEGS, BILATERAL 11/22/2008   Qualifier: Diagnosis of  By: Barbaraann Barthel MD, [place]    . Depression 2003Dx  . Edema   . Food allergy    citris  . FOOT PAIN, RIGHT 11/22/2008   Qualifier: Diagnosis of  By: Barbaraann Barthel MD, [place]    . Gastric ulcer   . GERD (gastroesophageal reflux disease)   . Gout   . Hypertension    took Norvasc for 3 months, doctor discontinued no longer on BP medications  . Joint pain   . KNEE INJURY, LEFT 01/01/2009   Qualifier: Diagnosis of  By: Delrae Alfred MD, Lanora Manis    . Migraine   . ONYCHOMYCOSIS, TOENAILS 11/22/2008   Qualifier: Diagnosis of  By: Barbaraann Barthel MD, [place]    . Shortness of breath   . Sleep apnea   . TINEA PEDIS 11/22/2008   Qualifier: Diagnosis of  By: Barbaraann Barthel MD, [place]      Past Surgical History:  Procedure Laterality Date  . DILITATION & CURRETTAGE/HYSTROSCOPY WITH NOVASURE ABLATION N/A 07/03/2017   Procedure: DILATATION & CURETTAGE/HYSTEROSCOPY WITH NOVASURE ABLATION;  Surgeon: Genia Del, MD;  Location: [facility];  Service: Gynecology;  Laterality: N/A;  . UPPER GI ENDOSCOPY    . WISDOM TOOTH EXTRACTION  2008   x4    There were no vitals filed for this visit.   Subjective Assessment - 03/25/18 0806    Subjective  Patient began having popping in her left knee about three months ago. She then began having posterior knee pain on the right side that starts in the back of her knee and goes down to her foot.  She has increased pain when walking and the weather also effects her knee.     Limitations  Standing;Walking    How long can you sit comfortably?  Stiffnes when she sits    How long can you stand comfortably?  45 minutes    How long can you walk comfortably?  Can walk around using the shopping cart for assistance     Diagnostic tests  X-ray right: advanced tri-compartmental arthritis     Patient Stated Goals  to have less pain     Currently in Pain?  Yes  Pain Score  6     Pain Location  Calf    Pain Orientation  Right    Pain Descriptors / Indicators  Aching    Pain Onset  More than a month ago    Pain Frequency  Constant    Aggravating Factors   standing and walking     Pain Relieving Factors  rest     Effect of Pain on Daily Activities  difficulty perfroming ADl's          [facility] PT Assessment - 03/25/18 0001      Assessment   Medical Diagnosis  Bilateral knee and calf pain     Referring Provider  Dr Reino Bellis     Next MD Visit  Nothing scheduled with Dr Margaretha Sheffield      Precautions   Precautions  None      Restrictions   Weight Bearing Restrictions  No    Other Position/Activity Restrictions  n      Balance Screen   Has the patient fallen in the past 6 months  No    Has the patient had a decrease in activity level because of a fear of falling?   No    Is the patient reluctant to leave their home because of a fear of falling?   No      Home Environment    Additional Comments  2nd floor apartment; 13 steps into the apratment        Prior Function   Level of Independence  Independent    Vocation  Full time employment    Hydrographic surveyor at [employer]   Overall Cognitive Status  Within Functional Limits for tasks assessed    Attention  Focused    Focused Attention  Appears intact    Memory  Appears intact    Awareness  Appears intact    Problem Solving  Appears intact      Sensation   Light Touch  Appears Intact    Additional Comments  denies parathesias       Coordination   Gross Motor Movements are Fluid and Coordinated  Yes    Fine Motor Movements are Fluid and Coordinated  Yes      Posture/Postural Control   Posture Comments  Morbid Obesity       ROM / Strength   AROM / PROM / Strength  AROM;PROM;Strength      AROM   AROM Assessment Site  Knee    Right/Left Knee  Right;Left    Right Knee Extension  -6    Right Knee Flexion  95    Left Knee Extension  -8    Left Knee Flexion  90      PROM   PROM Assessment Site  Knee;Hip;Ankle      Strength   Strength Assessment Site  Knee;Hip;Ankle    Right/Left Hip  Right;Left    Right Hip Flexion  4+/5    Right Hip ABduction  4+/5    Right Hip ADduction  4+/5    Left Hip Extension  4+/5    Left Hip ABduction  4+/5    Left Hip ADduction  4+/5    Right/Left Knee  Right;Left    Right Knee Flexion  4+/5    Right Knee Extension  4+/5    Left Knee Flexion  5/5    Left Knee Extension  5/5    Right/Left Ankle  Right  Right Ankle Dorsiflexion  4+/5    Right Ankle Plantar Flexion  -- unable to test       Palpation   Palpation comment  tendnerness to palpation in medial and lateral gatroc; tenderness to palpation in posterior knee       Ambulation/Gait   Gait Comments  significant lateral movement with ambaultion                 Objective measurements completed on examination: See above findings.              PT  Education - 03/25/18 0819    Education Details  HEP; symptom mangement;     Person(s) Educated  Patient    Methods  Explanation;Demonstration;Verbal cues;Tactile cues    Comprehension  Verbalized understanding;Returned demonstration;Verbal cues required;Tactile cues required       PT Short Term Goals - 03/25/18 1528      PT SHORT TERM GOAL #1   Title  Patient will demostrate full active bilateral knee extension     Time  4    Period  Weeks    Status  New    Target Date  04/22/18      PT SHORT TERM GOAL #2   Title  Patient will increase bilateral lower extremity strnegth to 5/5     Time  4    Period  Weeks    Status  New    Target Date  04/22/18      PT SHORT TERM GOAL #3   Title  Patient will be independent with taping if indicated     Time  4    Period  Weeks    Status  New        PT Long Term Goals - 03/25/18 1529      PT LONG TERM GOAL #1   Title  Patient will go up and down 13 steps without popping or pain in bilateral knees in order to get in and out of her appartment.     Time  8    Period  Weeks    Status  New      PT LONG TERM GOAL #2   Title  Patient will be independent with long term strengthening program in order to protect her knee long term     Time  8    Period  Weeks    Status  New      PT LONG TERM GOAL #3   Title  Patient will demostrate a 41% limitation on FOTO     Time  8    Period  Weeks    Status  New             Plan - 03/25/18 1519    Clinical Impression Statement  Patient is a 37 year old female with left knee crepitus and right knee and calf pain. She has icreased pain with standing and walking. She has limited patellar motion in both knees. She has pain in her right posterior knee which extends into the right calf. She appears to have a gastroc strain as well as significant degenerative changes in her knee. She is lacking full extension bilateral. She has limited knee flexion but that is partially at least due to large habitus.  She would benefit from skilled therapy to improve bilateral LE strength and stability as well as improve bilateral stability.     History and Personal Factors relevant to plan of care:  Morbid obesity, Low back pain  Clinical Presentation  Evolving    Clinical Presentation due to:  decreasing mobility over time     Clinical Decision Making  Moderate    Rehab Potential  Good    PT Frequency  2x / week    PT Duration  8 weeks    PT Treatment/Interventions  ADLs/Self Care Home Management;Cryotherapy;Electrical Stimulation;Iontophoresis 4mg /ml Dexamethasone;Ultrasound;Stair training;Gait training;Functional mobility training;Therapeutic activities;Therapeutic exercise;Patient/family education;Neuromuscular re-education;Manual techniques;Passive range of motion;Dry needling;Taping    PT Next Visit Plan  look at patellar mobility, if there is any mobility try taping; review quad set, SAQ, heel slide, consdier SLR but she may not be able to do it; gastroc stretch with strap; standing weight shifdts; May need manual therapy for knee extension; Modalities PRN    PT Home Exercise Plan  knee extension strtch; gastroc stretch; patellar mobilization     Consulted and Agree with Plan of Care  Patient       Patient will benefit from skilled therapeutic intervention in order to improve the following deficits and impairments:  Abnormal gait  Visit Diagnosis: Chronic pain of right knee  Chronic pain of left knee  Stiffness of right knee, not elsewhere classified  Stiffness of left knee, not elsewhere classified  Difficulty in walking, not elsewhere classified     Problem List Patient Active Problem List   Diagnosis Date Noted  . Class 3 obesity with serious comorbidity and body mass index (BMI) of 60.0 to 69.9 in adult 06/10/2017  . Fluid retention 06/10/2017  . Menorrhagia 05/18/2017  . Sacral radiculopathy 02/04/2017  . Insulin resistance 02/02/2017  . At risk for violence to self related  to depression 02/02/2017  . Morbid obesity (HCC) 01/01/2017  . Sore throat (viral) 11/21/2016  . Vitamin D deficiency 11/12/2016  . Prediabetes 11/12/2016  . Asthma 07/25/2016  . Abdominal pain 06/04/2016  . Otitis, externa, infective 06/04/2016  . Dysuria 03/20/2016  . Gout 11/21/2015  . Obesity, morbid, BMI 50 or higher (HCC) 05/14/2015  . BIPOLAR DISORDER UNSPECIFIED 11/22/2008  . Depression 12/10/2006  . MIGRAINE, UNSPEC., W/O INTRACTABLE MIGRAINE 12/10/2006  . GASTROESOPHAGEAL REFLUX, NO ESOPHAGITIS 12/10/2006  . OSA (obstructive sleep apnea) 12/10/2006    Dessie Comaavid J Margart Zemanek PT DPT  03/25/2018, 3:36 PM  [facility] [address] [place], [place], [address] Phone: [phone]   Fax:  [phone]  Name: Heywood IlesJansala N Garner MRN: [id number] Date of Birth: 03-10-1981

## 2018-03-30 ENCOUNTER — Ambulatory Visit (INDEPENDENT_AMBULATORY_CARE_PROVIDER_SITE_OTHER): Payer: 59 | Admitting: Physician Assistant

## 2018-03-30 VITALS — BP 105/71 | HR 93 | Temp 97.9°F | Ht 67.0 in | Wt >= 6400 oz

## 2018-03-30 DIAGNOSIS — E559 Vitamin D deficiency, unspecified: Secondary | ICD-10-CM

## 2018-03-30 DIAGNOSIS — F3289 Other specified depressive episodes: Secondary | ICD-10-CM | POA: Diagnosis not present

## 2018-03-30 DIAGNOSIS — Z9189 Other specified personal risk factors, not elsewhere classified: Secondary | ICD-10-CM | POA: Diagnosis not present

## 2018-03-30 DIAGNOSIS — Z6841 Body Mass Index (BMI) 40.0 and over, adult: Secondary | ICD-10-CM | POA: Diagnosis not present

## 2018-03-30 MED ORDER — VITAMIN D (ERGOCALCIFEROL) 1.25 MG (50000 UNIT) PO CAPS: 50000.0000 [IU] | ORAL_CAPSULE | ORAL | 0 refills | Status: DC

## 2018-03-30 NOTE — Progress Notes (Signed)
Office: [phone]  /  Fax: [phone]   HPI:   Chief Complaint: OBESITY Catherine [name] is here to discuss her progress with her obesity treatment plan. She is on the Category 3 plan and is following her eating plan approximately 85 % of the time. She states she is exercising 0 minutes 0 times per week. Catherine [name] is making mindful food choices and she controls her portions. She had some celebration eating. Her weight is (!) 419 lb (190.1 kg) today and has had a weight loss of 1 pounds over a period of 2 to 3 weeks since her last visit. She has lost 49 lbs since starting treatment with [name].  Vitamin D deficiency Catherine [name] has a diagnosis of vitamin D deficiency. She is currently taking vit D and denies nausea, vomiting or muscle weakness.  At risk for osteopenia and osteoporosis Catherine [name] is at higher risk of osteopenia and osteoporosis due to vitamin D deficiency.   Depression with emotional eating behaviors Catherine [name] is struggling with emotional eating and using food for comfort to the extent that it is negatively impacting her health. She often snacks when she is not hungry. Catherine [name] sometimes feels she is out of control and then feels guilty that she made poor food choices. She has been working on behavior modification techniques to help reduce her emotional eating and has been somewhat successful. Her mood is stable and she shows no sign of suicidal or homicidal ideations.  Depression screen Catherine [name] 2/9 01/18/2018 10/28/2017 07/14/2017 05/18/2017 03/24/2017  Decreased Interest 0 0 0 0 0  Down, Depressed, Hopeless 0 0 0 0 0  PHQ - 2 Score 0 0 0 0 0  Altered sleeping - - - - -  Tired, decreased energy - - - - -  Change in appetite - - - - -  Feeling bad or failure about yourself  - - - - -  Trouble concentrating - - - - -  Moving slowly or fidgety/restless - - - - -  Suicidal thoughts - - - - -  PHQ-9 Score - - - - -  Some encounter information is confidential and restricted. Go to Review Flowsheets  activity to see all data.      ALLERGIES: Allergies  Allergen Reactions  . Citrus Swelling    MEDICATIONS: Current Outpatient Medications on File Prior to Visit  Medication Sig Dispense Refill  . aspirin-acetaminophen-caffeine (EXCEDRIN MIGRAINE) 250-250-65 MG per tablet Take 2 tablets by mouth 2 (two) times daily as needed for migraine. For headache     . budesonide (PULMICORT) 180 MCG/ACT inhaler Inhale 1 puff into the lungs 2 (two) times daily. 1 Inhaler 1  . buPROPion (WELLBUTRIN SR) 200 MG 12 hr tablet Take 1 tablet (200 mg total) by mouth daily at 12 noon. 30 tablet 0  . cetirizine (ZYRTEC) 10 MG tablet Take 10 mg by mouth daily.    . diclofenac (CATAFLAM) 50 MG tablet Take 50 mg by mouth 2 (two) times daily at 10 AM and 5 PM.    . diclofenac sodium (VOLTAREN) 1 % GEL Apply 2 g topically 4 (four) times daily. 100 g 1  . furosemide (LASIX) 20 MG tablet Take 1 tablet (20 mg total) by mouth 2 (two) times daily. (Patient taking differently: Take 20 mg by mouth daily. ) 60 tablet 3  . gabapentin (NEURONTIN) 300 MG capsule TAKE 2 CAPSULES (600 MG TOTAL) BY MOUTH 3 TIMES A DAY 180 capsule 3  . ibuprofen (ADVIL,MOTRIN) 200 MG tablet Take 600-800 mg  by mouth every 8 (eight) hours as needed (for pain.).     . Insulin Pen Needle (BD PEN NEEDLE NANO U/F) 32G X 4 MM MISC 1 Package by Does not apply route daily at 12 noon. 100 each 0  . Liraglutide -Weight Management (SAXENDA) 18 MG/3ML SOPN Inject 3 mg into the skin daily. 5 pen 0  . meloxicam (MOBIC) 15 MG tablet TAKE 1 TABLET (15 MG TOTAL) BY MOUTH DAILY. 30 tablet 0  . polyethylene glycol powder (GLYCOLAX/MIRALAX) powder Take 17 g by mouth daily as needed. 3350 g 1  . ranitidine (ZANTAC) 150 MG tablet TAKE 1 TABLET BY MOUTH 2 TIMES DAILY. 60 tablet 0   No current facility-administered medications on file prior to visit.     PAST MEDICAL HISTORY: Past Medical History:  Diagnosis Date  . Allergy   . Anxiety   . Arthritis   . Asthma      since baby, seasonal  . ASTHMA, UNSPECIFIED 12/10/2006   Qualifier: Diagnosis of  By: Abundio MiuMcGregor, Barbara    . CELLULITIS AND ABSCESS OF LEG EXCEPT FOOT 12/30/2007   Qualifier: Diagnosis of  By: Daphine DeutscherMartin FNP, Zena AmosNykedtra    . Cyst of bone    right side of spine  . DEPENDENT EDEMA, LEGS, BILATERAL 11/22/2008   Qualifier: Diagnosis of  By: Barbaraann Barthelankins MD, [place]    . Depression 2003Dx  . Edema   . Food allergy    citris  . FOOT PAIN, RIGHT 11/22/2008   Qualifier: Diagnosis of  By: Barbaraann Barthelankins MD, [place]    . Gastric ulcer   . GERD (gastroesophageal reflux disease)   . Gout   . Hypertension    took Norvasc for 3 months, doctor discontinued no longer on BP medications  . Joint pain   . KNEE INJURY, LEFT 01/01/2009   Qualifier: Diagnosis of  By: Delrae AlfredMulberry MD, Lanora ManisElizabeth    . Migraine   . ONYCHOMYCOSIS, TOENAILS 11/22/2008   Qualifier: Diagnosis of  By: Barbaraann Barthelankins MD, [place]    . Shortness of breath   . Sleep apnea   . TINEA PEDIS 11/22/2008   Qualifier: Diagnosis of  By: Barbaraann Barthelankins MD, Benetta SparVictoria      PAST SURGICAL HISTORY: Past Surgical History:  Procedure Laterality Date  . DILITATION & CURRETTAGE/HYSTROSCOPY WITH NOVASURE ABLATION N/A 07/03/2017   Procedure: DILATATION & CURETTAGE/HYSTEROSCOPY WITH NOVASURE ABLATION;  Surgeon: Genia DelLavoie, Marie-Lyne, MD;  Location: [facility];  Service: Gynecology;  Laterality: N/A;  . UPPER GI ENDOSCOPY    . WISDOM TOOTH EXTRACTION  2008   x4    SOCIAL HISTORY: Social History   Tobacco Use  . Smoking status: Former Smoker    Packs/day: 0.50    Years: 18.00    Pack years: 9.00    Types: Cigarettes    Start date: 10/13/1996    Last attempt to quit: 01/12/2015    Years since quitting: 3.2  . Smokeless tobacco: Never Used  Substance Use Topics  . Alcohol use: Yes    Alcohol/week: 0.6 oz    Types: 1 Standard drinks or equivalent per week    Comment: rarely drinks  . Drug use: No    Comment: marjuana - former use, quit 4 years ago    FAMILY HISTORY: Family  History  Problem Relation Age of Onset  . Hypertension Mother   . Hyperlipidemia Mother   . Cancer Mother        bone cancer  . Heart disease Mother   . Sudden death Mother   .  Stroke Mother   . Thyroid disease Mother   . Sleep apnea Mother   . Obesity Mother   . Sudden death Father   . Hypertension Father   . Heart disease Father   . Hyperlipidemia Father   . Prostate cancer Father   . Lung cancer Father   . Obesity Father   . Alcoholism Father   . Sudden death Brother   . Hypertension Brother   . Hyperlipidemia Brother   . Heart attack Brother   . Heart disease Brother   . Stroke Brother   . Alcoholism Brother   . Drug abuse Brother   . Heart disease Sister   . Hypertension Sister   . Thyroid cancer Sister   . Diabetes Neg Hx   . Colon cancer Neg Hx   . Stomach cancer Neg Hx   . Esophageal cancer Neg Hx   . Rectal cancer Neg Hx   . Liver cancer Neg Hx     ROS: Review of Systems  Constitutional: Negative for weight loss.  Gastrointestinal: Negative for nausea and vomiting.  Musculoskeletal:       Negative for muscle weakness  Psychiatric/Behavioral: Positive for depression. Negative for suicidal ideas.    PHYSICAL EXAM: Blood pressure 105/71, pulse 93, temperature 97.9 F (36.6 C), temperature source Oral, height 5\' 7"  (1.702 m), weight (!) 419 lb (190.1 kg), SpO2 97 %. Body mass index is 65.62 kg/m. Physical Exam  Constitutional: She is oriented to person, place, and time. She appears well-developed and well-nourished.  Cardiovascular: Normal rate.  Pulmonary/Chest: Effort normal.  Musculoskeletal: Normal range of motion.  Neurological: She is oriented to person, place, and time.  Skin: Skin is warm and dry.  Psychiatric: She has a normal mood and affect. Her behavior is normal.  Vitals reviewed.   RECENT LABS AND TESTS: BMET    Component Value Date/Time   NA 140 12/10/2017 1242   K 3.7 12/10/2017 1242   CL 105 12/10/2017 1242   CO2 19 (L)  12/10/2017 1242   GLUCOSE 83 12/10/2017 1242   GLUCOSE 83 06/04/2016 1155   BUN 11 12/10/2017 1242   CREATININE 0.81 12/10/2017 1242   CREATININE 0.90 06/04/2016 1155   CALCIUM 9.0 12/10/2017 1242   GFRNONAA 94 12/10/2017 1242   GFRNONAA 84 06/04/2016 1155   GFRAA 108 12/10/2017 1242   GFRAA >89 06/04/2016 1155   Lab Results  Component Value Date   HGBA1C 5.3 12/10/2017   HGBA1C 5.4 06/25/2017   HGBA1C 5.2 05/19/2017   HGBA1C 5.4 01/20/2017   HGBA1C 5.6 10/27/2016   Lab Results  Component Value Date   INSULIN 43.6 (H) 12/10/2017   INSULIN 30.4 (H) 06/25/2017   INSULIN 29.7 (H) 01/20/2017   INSULIN 30.1 (H) 10/27/2016   CBC    Component Value Date/Time   WBC 10.6 12/10/2017 1242   WBC 13.3 (H) 07/03/2017 0610   RBC 4.64 12/10/2017 1242   RBC 4.33 07/03/2017 0610   HGB 12.5 12/10/2017 1242   HCT 38.4 12/10/2017 1242   PLT 293 07/03/2017 0610   PLT 343 05/18/2017 1611   MCV 83 12/10/2017 1242   MCH 26.9 12/10/2017 1242   MCH 25.9 (L) 07/03/2017 0610   MCHC 32.6 12/10/2017 1242   MCHC 32.4 07/03/2017 0610   RDW 16.2 (H) 12/10/2017 1242   LYMPHSABS 5.2 (H) 12/10/2017 1242   MONOABS 0.6 07/03/2017 0610   EOSABS 0.0 12/10/2017 1242   BASOSABS 0.0 12/10/2017 1242   Iron/TIBC/Ferritin/ %Sat  Component Value Date/Time   IRON 25 (L) 05/18/2017 1611   TIBC 363 05/18/2017 1611   FERRITIN 16 05/18/2017 1611   IRONPCTSAT 7 (LL) 05/18/2017 1611   Lipid Panel     Component Value Date/Time   CHOL 152 10/27/2016 1058   TRIG 57 10/27/2016 1058   HDL 43 10/27/2016 1058   CHOLHDL 3.6 11/21/2015 1047   VLDL 10 11/21/2015 1047   LDLCALC 98 10/27/2016 1058   Hepatic Function Panel     Component Value Date/Time   PROT 7.2 12/10/2017 1242   ALBUMIN 4.2 12/10/2017 1242   AST 22 12/10/2017 1242   ALT 27 12/10/2017 1242   ALKPHOS 62 12/10/2017 1242   BILITOT 0.5 12/10/2017 1242      Component Value Date/Time   TSH 1.43 05/19/2017 1245   TSH 1.460 10/27/2016 1058    TSH 0.96 11/21/2015 1047   Results for LASHANTA, ELBE (MRN [id number]) as of 03/30/2018 11:40  Ref. Range 12/10/2017 12:42  Vitamin D, 25-Hydroxy Latest Ref Range: 30.0 - 100.0 ng/mL 28.5 (L)   ASSESSMENT AND PLAN: Vitamin D deficiency - Plan: Vitamin D, Ergocalciferol, (DRISDOL) 50000 units CAPS capsule  Other depression - with emotional eating  At risk for osteoporosis  Class 3 severe obesity with serious comorbidity and body mass index (BMI) of 60.0 to 69.9 in adult, unspecified obesity type (HCC)  PLAN:  Vitamin D Deficiency Malynn was informed that low vitamin D levels contributes to fatigue and are associated with obesity, breast, and colon cancer. She agrees to continue to take prescription Vit D @50 ,000 IU every week #4 with no refills and will follow up for routine testing of vitamin D, at least 2-3 times per year. She was informed of the risk of over-replacement of vitamin D and agrees to not increase her dose unless she discusses this with [name] first. Lakeasha agrees to follow up as directed.  At risk for osteopenia and osteoporosis Brecklynn is at risk for osteopenia and osteoporosis due to her vitamin D deficiency. She was encouraged to take her vitamin D and follow her higher calcium diet and increase strengthening exercise to help strengthen her bones and decrease her risk of osteopenia and osteoporosis.  Depression with Emotional Eating Behaviors We discussed behavior modification techniques today to help Mahasin deal with her emotional eating and depression. She has agreed to continue Wellbutrin SR 200 mg qd and agreed to follow up as directed.  Obesity Mariselda is currently in the action stage of change. As such, her goal is to continue with weight loss efforts She has agreed to follow a lower carbohydrate, vegetable and lean protein rich diet plan [place] has been instructed to work up to a goal of 150 minutes of combined cardio and strengthening exercise per week for  weight loss and overall health benefits. We discussed the following Behavioral Modification Strategies today: increasing lean protein intake and decreasing simple carbohydrates   We discussed various medication options to help Avyn with her weight loss efforts and we both agreed to continue Saxenda.  Joleene has agreed to follow up with our clinic in 2 weeks. She was informed of the importance of frequent follow up visits to maximize her success with intensive lifestyle modifications for her multiple health conditions.  Cristi Loron, am acting as transcriptionist for [employer], PA-C I, [name], have reviewed this note and agree with its content

## 2018-03-31 ENCOUNTER — Ambulatory Visit (INDEPENDENT_AMBULATORY_CARE_PROVIDER_SITE_OTHER): Payer: 59 | Admitting: Physician Assistant

## 2018-04-06 ENCOUNTER — Ambulatory Visit: Payer: 59

## 2018-04-08 MED FILL — VIT D2 1.25 MG (50,000 UNIT: 1.25 MG | 28 days supply | Qty: 4 | Fill #0

## 2018-04-09 ENCOUNTER — Encounter

## 2018-04-09 ENCOUNTER — Ambulatory Visit: Payer: 59 | Admitting: Physical Therapy

## 2018-04-09 ENCOUNTER — Encounter: Payer: Self-pay | Admitting: Physical Therapy

## 2018-04-09 DIAGNOSIS — G8929 Other chronic pain: Secondary | ICD-10-CM

## 2018-04-09 DIAGNOSIS — R262 Difficulty in walking, not elsewhere classified: Secondary | ICD-10-CM

## 2018-04-09 DIAGNOSIS — M25662 Stiffness of left knee, not elsewhere classified: Secondary | ICD-10-CM

## 2018-04-09 DIAGNOSIS — M25661 Stiffness of right knee, not elsewhere classified: Secondary | ICD-10-CM

## 2018-04-09 DIAGNOSIS — M25561 Pain in right knee: Secondary | ICD-10-CM | POA: Diagnosis not present

## 2018-04-09 DIAGNOSIS — M25562 Pain in left knee: Secondary | ICD-10-CM | POA: Diagnosis not present

## 2018-04-09 NOTE — Therapy (Signed)
[facility] [address] [place], [place], [address] Phone: [phone]   Fax:  [phone]  Physical Therapy Treatment  Patient Details  Name: Catherine IlesJansala N Thien MRN: [id number] Date of Birth: 1981/08/29 Referring Provider: Dr Reino Bellisimothy Draper    Encounter Date: 04/09/2018  PT End of Session - 04/09/18 1012    Visit Number  2    Number of Visits  16    Date for PT Re-Evaluation  05/20/18    Authorization Type  [facility]     PT Start Time  0933    PT Stop Time  1012    PT Time Calculation (min)  39 min    Activity Tolerance  Patient tolerated treatment well    Behavior During Therapy  [facility] for tasks assessed/performed       Past Medical History:  Diagnosis Date  . Allergy   . Anxiety   . Arthritis   . Asthma    since baby, seasonal  . ASTHMA, UNSPECIFIED 12/10/2006   Qualifier: Diagnosis of  By: Abundio MiuMcGregor, Barbara    . CELLULITIS AND ABSCESS OF LEG EXCEPT FOOT 12/30/2007   Qualifier: Diagnosis of  By: Daphine DeutscherMartin FNP, Zena AmosNykedtra    . Cyst of bone    right side of spine  . DEPENDENT EDEMA, LEGS, BILATERAL 11/22/2008   Qualifier: Diagnosis of  By: Barbaraann Barthelankins MD, [place]    . Depression 2003Dx  . Edema   . Food allergy    citris  . FOOT PAIN, RIGHT 11/22/2008   Qualifier: Diagnosis of  By: Barbaraann Barthelankins MD, [place]    . Gastric ulcer   . GERD (gastroesophageal reflux disease)   . Gout   . Hypertension    took Norvasc for 3 months, doctor discontinued no longer on BP medications  . Joint pain   . KNEE INJURY, LEFT 01/01/2009   Qualifier: Diagnosis of  By: Delrae AlfredMulberry MD, Lanora ManisElizabeth    . Migraine   . ONYCHOMYCOSIS, TOENAILS 11/22/2008   Qualifier: Diagnosis of  By: Barbaraann Barthelankins MD, [place]    . Shortness of breath   . Sleep apnea   . TINEA PEDIS 11/22/2008   Qualifier: Diagnosis of  By: Barbaraann Barthelankins MD, [place]      Past Surgical History:  Procedure Laterality Date  . DILITATION & CURRETTAGE/HYSTROSCOPY WITH NOVASURE ABLATION N/A 07/03/2017    Procedure: DILATATION & CURETTAGE/HYSTEROSCOPY WITH NOVASURE ABLATION;  Surgeon: Genia DelLavoie, Marie-Lyne, MD;  Location: [facility];  Service: Gynecology;  Laterality: N/A;  . UPPER GI ENDOSCOPY    . WISDOM TOOTH EXTRACTION  2008   x4    There were no vitals filed for this visit.  Subjective Assessment - 04/09/18 0935    Subjective  I am doing OK today, my HEP is going OK. Able to verbalize HEP correctly. They are a litttle painful     Patient Stated Goals  to have less pain     Currently in Pain?  Yes    Pain Score  4     Pain Location  Calf    Pain Orientation  Right    Pain Descriptors / Indicators  Spasm;Tightness    Pain Type  Chronic pain    Pain Radiating Towards  none but can radiate when I get charlie horses     Pain Onset  More than a month ago    Pain Frequency  Constant    Aggravating Factors   standing and walking     Pain Relieving Factors  rest  Effect of Pain on Daily Activities  moderate                        [facility] Adult PT Treatment/Exercise - 04/09/18 0001      Exercises   Exercises  Knee/Hip      Knee/Hip Exercises: Stretches   Active Hamstring Stretch  Both;2 reps;30 seconds    Gastroc Stretch  Both;2 reps;30 seconds      Knee/Hip Exercises: Supine   Quad Sets  20 reps    Quad Sets Limitations  3 second holds     Short Arc [employer]  Both;1 set;10 reps;Other (comment) 3 second holds     Straight Leg Raises  Both;1 set;10 reps      Manual Therapy   Manual Therapy  Joint mobilization;Soft tissue mobilization    Manual therapy comments  separate from all othe rskilled services     Joint Mobilization  patella mobs grade III all directions     Soft tissue mobilization  STM as tolerated R posterior medial calf to tolerance              PT Education - 04/09/18 1012    Education Details  verbal review of goals, exercise form and purpose     Person(s) Educated  Patient    Methods  Explanation    Comprehension  Verbalized understanding        PT Short Term Goals - 03/25/18 1528      PT SHORT TERM GOAL #1   Title  Patient will demostrate full active bilateral knee extension     Time  4    Period  Weeks    Status  New    Target Date  04/22/18      PT SHORT TERM GOAL #2   Title  Patient will increase bilateral lower extremity strnegth to 5/5     Time  4    Period  Weeks    Status  New    Target Date  04/22/18      PT SHORT TERM GOAL #3   Title  Patient will be independent with taping if indicated     Time  4    Period  Weeks    Status  New        PT Long Term Goals - 03/25/18 1529      PT LONG TERM GOAL #1   Title  Patient will go up and down 13 steps without popping or pain in bilateral knees in order to get in and out of her appartment.     Time  8    Period  Weeks    Status  New      PT LONG TERM GOAL #2   Title  Patient will be independent with long term strengthening program in order to protect her knee long term     Time  8    Period  Weeks    Status  New      PT LONG TERM GOAL #3   Title  Patient will demostrate a 41% limitation on FOTO     Time  8    Period  Weeks    Status  New            Plan - 04/09/18 1013    Clinical Impression Statement  Verbally reviewed goals and then proceeded with patella mobility and functional strengthening and stretching as indicated in PT POC. Patient with significant weakness and  poor muscle endurance in B LEs as evidenced by frequent cramping during exercises. Noted significant restrictions in patella mobility but able to somewhat improve with manual today.     Rehab Potential  Good    PT Frequency  2x / week    PT Duration  8 weeks    PT Treatment/Interventions  ADLs/Self Care Home Management;Cryotherapy;Electrical Stimulation;Iontophoresis 4mg /ml Dexamethasone;Ultrasound;Stair training;Gait training;Functional mobility training;Therapeutic activities;Therapeutic exercise;Patient/family education;Neuromuscular re-education;Manual techniques;Passive  range of motion;Dry needling;Taping    PT Next Visit Plan  look at patellar mobility, if there is any mobility try taping; review quad set, SAQ, heel slide, consdier SLR but she may not be able to do it; gastroc stretch with strap; standing weight shifdts; May need manual therapy for knee extension     PT Home Exercise Plan  knee extension strtch; gastroc stretch; patellar mobilization     Consulted and Agree with Plan of Care  Patient       Patient will benefit from skilled therapeutic intervention in order to improve the following deficits and impairments:  Abnormal gait  Visit Diagnosis: Chronic pain of right knee  Chronic pain of left knee  Stiffness of right knee, not elsewhere classified  Stiffness of left knee, not elsewhere classified  Difficulty in walking, not elsewhere classified     Problem List Patient Active Problem List   Diagnosis Date Noted  . Class 3 obesity with serious comorbidity and body mass index (BMI) of 60.0 to 69.9 in adult 06/10/2017  . Fluid retention 06/10/2017  . Menorrhagia 05/18/2017  . Sacral radiculopathy 02/04/2017  . Insulin resistance 02/02/2017  . At risk for violence to self related to depression 02/02/2017  . Morbid obesity (HCC) 01/01/2017  . Sore throat (viral) 11/21/2016  . Vitamin D deficiency 11/12/2016  . Prediabetes 11/12/2016  . Asthma 07/25/2016  . Abdominal pain 06/04/2016  . Otitis, externa, infective 06/04/2016  . Dysuria 03/20/2016  . Gout 11/21/2015  . Obesity, morbid, BMI 50 or higher (HCC) 05/14/2015  . BIPOLAR DISORDER UNSPECIFIED 11/22/2008  . Depression 12/10/2006  . MIGRAINE, UNSPEC., W/O INTRACTABLE MIGRAINE 12/10/2006  . GASTROESOPHAGEAL REFLUX, NO ESOPHAGITIS 12/10/2006  . OSA (obstructive sleep apnea) 12/10/2006    Nedra Hai PT, DPT, CBIS  Supplemental Physical Therapist [facility]   Pager [phone]   [facility] [address] [place], [place], [address] Phone: [phone]   Fax:  [phone]  Name: CHRISTINEA BRIZUELA MRN: [id number] Date of Birth: 1981/07/16

## 2018-04-13 ENCOUNTER — Ambulatory Visit (INDEPENDENT_AMBULATORY_CARE_PROVIDER_SITE_OTHER): Payer: 59 | Admitting: Physician Assistant

## 2018-04-13 VITALS — BP 117/76 | HR 98 | Temp 98.3°F | Ht 67.0 in | Wt >= 6400 oz

## 2018-04-13 DIAGNOSIS — E559 Vitamin D deficiency, unspecified: Secondary | ICD-10-CM | POA: Diagnosis not present

## 2018-04-13 DIAGNOSIS — Z6841 Body Mass Index (BMI) 40.0 and over, adult: Secondary | ICD-10-CM

## 2018-04-13 NOTE — Progress Notes (Signed)
Office: [phone]  /  Fax: [phone]   HPI:   Chief Complaint: OBESITY Catherine [name] is here to discuss her progress with her obesity treatment plan. She is on the lower carbohydrate, vegetable and lean protein rich diet plan and is following her eating plan approximately 25 % of the time. She states she is exercising 0 minutes 0 times per week. Catherine [name] has been sick with an upper respiratory infection and has not been eating. She is better and is motivated to get back on track. Her weight is (!) 422 lb (191.4 kg) today and has had a weight gain of 3 pounds over a period of 2 weeks since her last visit. She has lost 46 lbs since starting treatment with us.  Vitamin D deficiency Catherine [name] has a diagnosis of vitamin D deficiency. She is currently taking vit D and denies nausea, vomiting or muscle weakness.  ALLERGIES: Allergies  Allergen Reactions  . Citrus Swelling    MEDICATIONS: Current Outpatient Medications on File Prior to Visit  Medication Sig Dispense Refill  . aspirin-acetaminophen-caffeine (EXCEDRIN MIGRAINE) 250-250-65 MG per tablet Take 2 tablets by mouth 2 (two) times daily as needed for migraine. For headache     . budesonide (PULMICORT) 180 MCG/ACT inhaler Inhale 1 puff into the lungs 2 (two) times daily. 1 Inhaler 1  . buPROPion (WELLBUTRIN SR) 200 MG 12 hr tablet Take 1 tablet (200 mg total) by mouth daily at 12 noon. 30 tablet 0  . cetirizine (ZYRTEC) 10 MG tablet Take 10 mg by mouth daily.    . diclofenac (CATAFLAM) 50 MG tablet Take 50 mg by mouth 2 (two) times daily at 10 AM and 5 PM.    . diclofenac sodium (VOLTAREN) 1 % GEL Apply 2 g topically 4 (four) times daily. 100 g 1  . furosemide (LASIX) 20 MG tablet Take 1 tablet (20 mg total) by mouth 2 (two) times daily. (Patient taking differently: Take 20 mg by mouth daily. ) 60 tablet 3  . gabapentin (NEURONTIN) 300 MG capsule TAKE 2 CAPSULES (600 MG TOTAL) BY MOUTH 3 TIMES A DAY 180 capsule 3  . ibuprofen  (ADVIL,MOTRIN) 200 MG tablet Take 600-800 mg by mouth every 8 (eight) hours as needed (for pain.).     . Insulin Pen Needle (BD PEN NEEDLE NANO U/F) 32G X 4 MM MISC 1 Package by Does not apply route daily at 12 noon. 100 each 0  . Liraglutide -Weight Management (SAXENDA) 18 MG/3ML SOPN Inject 3 mg into the skin daily. 5 pen 0  . meloxicam (MOBIC) 15 MG tablet TAKE 1 TABLET (15 MG TOTAL) BY MOUTH DAILY. 30 tablet 0  . polyethylene glycol powder (GLYCOLAX/MIRALAX) powder Take 17 g by mouth daily as needed. 3350 g 1  . ranitidine (ZANTAC) 150 MG tablet TAKE 1 TABLET BY MOUTH 2 TIMES DAILY. 60 tablet 0  . Vitamin D, Ergocalciferol, (DRISDOL) 50000 units CAPS capsule Take 1 capsule (50,000 Units total) by mouth every 7 (seven) days. 4 capsule 0   No current facility-administered medications on file prior to visit.     PAST MEDICAL HISTORY: Past Medical History:  Diagnosis Date  . Allergy   . Anxiety   . Arthritis   . Asthma    since baby, seasonal  . ASTHMA, UNSPECIFIED 12/10/2006   Qualifier: Diagnosis of  By: Abundio MiuMcGregor, Barbara    . CELLULITIS AND ABSCESS OF LEG EXCEPT FOOT 12/30/2007   Qualifier: Diagnosis of  By: Daphine DeutscherMartin FNP, Zena AmosNykedtra    .  Cyst of bone    right side of spine  . DEPENDENT EDEMA, LEGS, BILATERAL 11/22/2008   Qualifier: Diagnosis of  By: Barbaraann Barthel MD, [place]    . Depression 2003Dx  . Edema   . Food allergy    citris  . FOOT PAIN, RIGHT 11/22/2008   Qualifier: Diagnosis of  By: Barbaraann Barthel MD, [place]    . Gastric ulcer   . GERD (gastroesophageal reflux disease)   . Gout   . Hypertension    took Norvasc for 3 months, doctor discontinued no longer on BP medications  . Joint pain   . KNEE INJURY, LEFT 01/01/2009   Qualifier: Diagnosis of  By: Delrae Alfred MD, Lanora Manis    . Migraine   . ONYCHOMYCOSIS, TOENAILS 11/22/2008   Qualifier: Diagnosis of  By: Barbaraann Barthel MD, [place]    . Shortness of breath   . Sleep apnea   . TINEA PEDIS 11/22/2008   Qualifier: Diagnosis of  By:  Barbaraann Barthel MD, Benetta Spar      PAST SURGICAL HISTORY: Past Surgical History:  Procedure Laterality Date  . DILITATION & CURRETTAGE/HYSTROSCOPY WITH NOVASURE ABLATION N/A 07/03/2017   Procedure: DILATATION & CURETTAGE/HYSTEROSCOPY WITH NOVASURE ABLATION;  Surgeon: Genia Del, MD;  Location: [facility];  Service: Gynecology;  Laterality: N/A;  . UPPER GI ENDOSCOPY    . WISDOM TOOTH EXTRACTION  2008   x4    SOCIAL HISTORY: Social History   Tobacco Use  . Smoking status: Former Smoker    Packs/day: 0.50    Years: 18.00    Pack years: 9.00    Types: Cigarettes    Start date: 10/13/1996    Last attempt to quit: 01/12/2015    Years since quitting: 3.2  . Smokeless tobacco: Never Used  Substance Use Topics  . Alcohol use: Yes    Alcohol/week: 0.6 oz    Types: 1 Standard drinks or equivalent per week    Comment: rarely drinks  . Drug use: No    Comment: marjuana - former use, quit 4 years ago    FAMILY HISTORY: Family History  Problem Relation Age of Onset  . Hypertension Mother   . Hyperlipidemia Mother   . Cancer Mother        bone cancer  . Heart disease Mother   . Sudden death Mother   . Stroke Mother   . Thyroid disease Mother   . Sleep apnea Mother   . Obesity Mother   . Sudden death Father   . Hypertension Father   . Heart disease Father   . Hyperlipidemia Father   . Prostate cancer Father   . Lung cancer Father   . Obesity Father   . Alcoholism Father   . Sudden death Brother   . Hypertension Brother   . Hyperlipidemia Brother   . Heart attack Brother   . Heart disease Brother   . Stroke Brother   . Alcoholism Brother   . Drug abuse Brother   . Heart disease Sister   . Hypertension Sister   . Thyroid cancer Sister   . Diabetes Neg Hx   . Colon cancer Neg Hx   . Stomach cancer Neg Hx   . Esophageal cancer Neg Hx   . Rectal cancer Neg Hx   . Liver cancer Neg Hx     ROS: Review of Systems  Constitutional: Negative for weight loss.  Gastrointestinal:  Negative for nausea and vomiting.  Musculoskeletal:       Negative for muscle weakness  PHYSICAL EXAM: Blood pressure 117/76, pulse 98, temperature 98.3 F (36.8 C), temperature source Oral, height 5\' 7"  (1.702 m), weight (!) 422 lb (191.4 kg), SpO2 99 %. Body mass index is 66.09 kg/m. Physical Exam  Constitutional: She is oriented to person, place, and time. She appears well-developed and well-nourished.  Cardiovascular: Normal rate.  Pulmonary/Chest: Effort normal.  Musculoskeletal: Normal range of motion.  Neurological: She is oriented to person, place, and time.  Skin: Skin is warm and dry.  Psychiatric: She has a normal mood and affect. Her behavior is normal.  Vitals reviewed.   RECENT LABS AND TESTS: BMET    Component Value Date/Time   NA 140 12/10/2017 1242   K 3.7 12/10/2017 1242   CL 105 12/10/2017 1242   CO2 19 (L) 12/10/2017 1242   GLUCOSE 83 12/10/2017 1242   GLUCOSE 83 06/04/2016 1155   BUN 11 12/10/2017 1242   CREATININE 0.81 12/10/2017 1242   CREATININE 0.90 06/04/2016 1155   CALCIUM 9.0 12/10/2017 1242   GFRNONAA 94 12/10/2017 1242   GFRNONAA 84 06/04/2016 1155   GFRAA 108 12/10/2017 1242   GFRAA >89 06/04/2016 1155   Lab Results  Component Value Date   HGBA1C 5.3 12/10/2017   HGBA1C 5.4 06/25/2017   HGBA1C 5.2 05/19/2017   HGBA1C 5.4 01/20/2017   HGBA1C 5.6 10/27/2016   Lab Results  Component Value Date   INSULIN 43.6 (H) 12/10/2017   INSULIN 30.4 (H) 06/25/2017   INSULIN 29.7 (H) 01/20/2017   INSULIN 30.1 (H) 10/27/2016   CBC    Component Value Date/Time   WBC 10.6 12/10/2017 1242   WBC 13.3 (H) 07/03/2017 0610   RBC 4.64 12/10/2017 1242   RBC 4.33 07/03/2017 0610   HGB 12.5 12/10/2017 1242   HCT 38.4 12/10/2017 1242   PLT 293 07/03/2017 0610   PLT 343 05/18/2017 1611   MCV 83 12/10/2017 1242   MCH 26.9 12/10/2017 1242   MCH 25.9 (L) 07/03/2017 0610   MCHC 32.6 12/10/2017 1242   MCHC 32.4 07/03/2017 0610   RDW 16.2 (H)  12/10/2017 1242   LYMPHSABS 5.2 (H) 12/10/2017 1242   MONOABS 0.6 07/03/2017 0610   EOSABS 0.0 12/10/2017 1242   BASOSABS 0.0 12/10/2017 1242   Iron/TIBC/Ferritin/ %Sat    Component Value Date/Time   IRON 25 (L) 05/18/2017 1611   TIBC 363 05/18/2017 1611   FERRITIN 16 05/18/2017 1611   IRONPCTSAT 7 (LL) 05/18/2017 1611   Lipid Panel     Component Value Date/Time   CHOL 152 10/27/2016 1058   TRIG 57 10/27/2016 1058   HDL 43 10/27/2016 1058   CHOLHDL 3.6 11/21/2015 1047   VLDL 10 11/21/2015 1047   LDLCALC 98 10/27/2016 1058   Hepatic Function Panel     Component Value Date/Time   PROT 7.2 12/10/2017 1242   ALBUMIN 4.2 12/10/2017 1242   AST 22 12/10/2017 1242   ALT 27 12/10/2017 1242   ALKPHOS 62 12/10/2017 1242   BILITOT 0.5 12/10/2017 1242      Component Value Date/Time   TSH 1.43 05/19/2017 1245   TSH 1.460 10/27/2016 1058   TSH 0.96 11/21/2015 1047   Results for KAIYANA, BEDORE (MRN [id number]) as of 04/13/2018 16:06  Ref. Range 12/10/2017 12:42  Vitamin D, 25-Hydroxy Latest Ref Range: 30.0 - 100.0 ng/mL 28.5 (L)   ASSESSMENT AND PLAN: Vitamin D deficiency  Class 3 severe obesity with serious comorbidity and body mass index (BMI) of 60.0 to 69.9 in adult, unspecified  obesity type (HCC)  PLAN:  Vitamin D Deficiency Heylee was informed that low vitamin D levels contributes to fatigue and are associated with obesity, breast, and colon cancer. She agrees to continue to take prescription Vit D @50 ,000 IU every week and will follow up for routine testing of vitamin D, at least 2-3 times per year. She was informed of the risk of over-replacement of vitamin D and agrees to not increase her dose unless she discusses this with [name] first.  We spent > than 50% of the 15 minute visit on the counseling as documented in the note.  Obesity Lorana is currently in the action stage of change. As such, her goal is to get back to weightloss efforts  She has agreed to follow a  lower carbohydrate, vegetable and lean protein rich diet plan [employer] has been instructed to work up to a goal of 150 minutes of combined cardio and strengthening exercise per week for weight loss and overall health benefits. We discussed the following Behavioral Modification Strategies today: increasing lean protein intake and planning for success  Lorree has agreed to follow up with our clinic in 2 weeks. She was informed of the importance of frequent follow up visits to maximize her success with intensive lifestyle modifications for her multiple health conditions.  Cristi Loron, am acting as transcriptionist for [employer], PA-C I, Illa [facility], have reviewed this note and agree with its content

## 2018-04-14 ENCOUNTER — Ambulatory Visit: Payer: 59 | Attending: Sports Medicine | Admitting: Physical Therapy

## 2018-04-14 ENCOUNTER — Encounter: Payer: Self-pay | Admitting: Physical Therapy

## 2018-04-14 DIAGNOSIS — M25561 Pain in right knee: Secondary | ICD-10-CM | POA: Diagnosis not present

## 2018-04-14 DIAGNOSIS — M25661 Stiffness of right knee, not elsewhere classified: Secondary | ICD-10-CM | POA: Diagnosis present

## 2018-04-14 DIAGNOSIS — M25662 Stiffness of left knee, not elsewhere classified: Secondary | ICD-10-CM | POA: Insufficient documentation

## 2018-04-14 DIAGNOSIS — G8929 Other chronic pain: Secondary | ICD-10-CM | POA: Insufficient documentation

## 2018-04-14 DIAGNOSIS — R262 Difficulty in walking, not elsewhere classified: Secondary | ICD-10-CM | POA: Insufficient documentation

## 2018-04-14 DIAGNOSIS — M25562 Pain in left knee: Secondary | ICD-10-CM | POA: Diagnosis present

## 2018-04-14 NOTE — Therapy (Signed)
[facility] [address] [place], [place], [address] Phone: [phone]   Fax:  [phone]  Physical Therapy Treatment  Patient Details  Name: Catherine [name] MRN: [id number] Date of Birth: 03-21-1981 Referring Provider: Dr Reino Bellis    Encounter Date: 04/14/2018  PT End of Session - 04/14/18 0847    Visit Number  3    Number of Visits  16    Date for PT Re-Evaluation  05/20/18    Authorization Type  [facility]     PT Start Time  0809 patient arrived late     PT Stop Time  0843    PT Time Calculation (min)  34 min    Activity Tolerance  Patient tolerated treatment well    Behavior During Therapy  [facility] for tasks assessed/performed       Past Medical History:  Diagnosis Date  . Allergy   . Anxiety   . Arthritis   . Asthma    since baby, seasonal  . ASTHMA, UNSPECIFIED 12/10/2006   Qualifier: Diagnosis of  By: Abundio Miu    . CELLULITIS AND ABSCESS OF LEG EXCEPT FOOT 12/30/2007   Qualifier: Diagnosis of  By: Daphine Deutscher FNP, Zena Amos    . Cyst of bone    right side of spine  . DEPENDENT EDEMA, LEGS, BILATERAL 11/22/2008   Qualifier: Diagnosis of  By: Barbaraann Barthel MD, [place]    . Depression 2003Dx  . Edema   . Food allergy    citris  . FOOT PAIN, RIGHT 11/22/2008   Qualifier: Diagnosis of  By: Barbaraann Barthel MD, [place]    . Gastric ulcer   . GERD (gastroesophageal reflux disease)   . Gout   . Hypertension    took Norvasc for 3 months, doctor discontinued no longer on BP medications  . Joint pain   . KNEE INJURY, LEFT 01/01/2009   Qualifier: Diagnosis of  By: Delrae Alfred MD, Lanora Manis    . Migraine   . ONYCHOMYCOSIS, TOENAILS 11/22/2008   Qualifier: Diagnosis of  By: Barbaraann Barthel MD, [place]    . Shortness of breath   . Sleep apnea   . TINEA PEDIS 11/22/2008   Qualifier: Diagnosis of  By: Barbaraann Barthel MD, [place]      Past Surgical History:  Procedure Laterality Date  . DILITATION & CURRETTAGE/HYSTROSCOPY WITH NOVASURE ABLATION  N/A 07/03/2017   Procedure: DILATATION & CURETTAGE/HYSTEROSCOPY WITH NOVASURE ABLATION;  Surgeon: Genia Del, MD;  Location: [facility];  Service: Gynecology;  Laterality: N/A;  . UPPER GI ENDOSCOPY    . WISDOM TOOTH EXTRACTION  2008   x4    There were no vitals filed for this visit.  Subjective Assessment - 04/14/18 0809    Subjective  I felt pretty good after last session, no other major changes     Diagnostic tests  X-ray right: advanced tri-compartmental arthritis     Patient Stated Goals  to have less pain     Currently in Pain?  Yes    Pain Score  3     Pain Location  Calf    Pain Orientation  Right    Pain Descriptors / Indicators  Tightness;Spasm                       [facility] Adult PT Treatment/Exercise - 04/14/18 0001      Knee/Hip Exercises: Stretches   Gastroc Stretch  Both;3 reps;30 seconds      Knee/Hip Exercises: Standing   Heel Raises  Both;1 set;10 reps    Other Standing Knee Exercises  standing weight shifts 1x20       Knee/Hip Exercises: Seated   Long Arc Quad  --    Long Arc [employer]  --      Knee/Hip Exercises: Supine   Short Arc [employer]  Both;1 set;15 reps 2.5#     Bridges  Both;1 set;15 reps    Straight Leg Raises  Both;1 set;15 reps      Knee/Hip Exercises: Sidelying   Hip ABduction  Both;1 set;15 reps      Knee/Hip Exercises: Prone   Hip Extension  Both;1 set;10 reps      Manual Therapy   Manual Therapy  Joint mobilization;Soft tissue mobilization    Manual therapy comments  separate from all othe rskilled services     Joint Mobilization  patella mobs grade III all directions     Soft tissue mobilization  STM as tolerated R posterior medial calf to tolerance              PT Education - 04/14/18 0847    Education Details  exercise form and purpose     Person(s) Educated  Patient    Methods  Explanation    Comprehension  Verbalized understanding       PT Short Term Goals - 03/25/18 1528      PT SHORT TERM  GOAL #1   Title  Patient will demostrate full active bilateral knee extension     Time  4    Period  Weeks    Status  New    Target Date  04/22/18      PT SHORT TERM GOAL #2   Title  Patient will increase bilateral lower extremity strnegth to 5/5     Time  4    Period  Weeks    Status  New    Target Date  04/22/18      PT SHORT TERM GOAL #3   Title  Patient will be independent with taping if indicated     Time  4    Period  Weeks    Status  New        PT Long Term Goals - 03/25/18 1529      PT LONG TERM GOAL #1   Title  Patient will go up and down 13 steps without popping or pain in bilateral knees in order to get in and out of her appartment.     Time  8    Period  Weeks    Status  New      PT LONG TERM GOAL #2   Title  Patient will be independent with long term strengthening program in order to protect her knee long term     Time  8    Period  Weeks    Status  New      PT LONG TERM GOAL #3   Title  Patient will demostrate a 41% limitation on FOTO     Time  8    Period  Weeks    Status  New            Plan - 04/14/18 0848    Clinical Impression Statement  Patient arrives a few minutes late today, reports she felt quite a bit of relief from calf stretching and manual interventions last session. Continued with patella mobility, which has improved significantly, and general functional strengthening program prior to stretching and STM today.  Rehab Potential  Good    PT Frequency  2x / week    PT Duration  8 weeks    PT Treatment/Interventions  ADLs/Self Care Home Management;Cryotherapy;Electrical Stimulation;Iontophoresis 4mg /ml Dexamethasone;Ultrasound;Stair training;Gait training;Functional mobility training;Therapeutic activities;Therapeutic exercise;Patient/family education;Neuromuscular re-education;Manual techniques;Passive range of motion;Dry needling;Taping    PT Next Visit Plan  Update HEP. look at patellar mobility, if there is any mobility try  taping; review quad set, SAQ, heel slide, consdier SLR but she may not be able to do it; gastroc stretch with strap; standing weight shifdts; May need manual therapy for knee extension     PT Home Exercise Plan  knee extension strtch; gastroc stretch; patellar mobilization     Consulted and Agree with Plan of Care  Patient       Patient will benefit from skilled therapeutic intervention in order to improve the following deficits and impairments:  Abnormal gait  Visit Diagnosis: Chronic pain of right knee  Chronic pain of left knee  Stiffness of right knee, not elsewhere classified  Stiffness of left knee, not elsewhere classified  Difficulty in walking, not elsewhere classified     Problem List Patient Active Problem List   Diagnosis Date Noted  . Class 3 obesity with serious comorbidity and body mass index (BMI) of 60.0 to 69.9 in adult 06/10/2017  . Fluid retention 06/10/2017  . Menorrhagia 05/18/2017  . Sacral radiculopathy 02/04/2017  . Insulin resistance 02/02/2017  . At risk for violence to self related to depression 02/02/2017  . Morbid obesity (HCC) 01/01/2017  . Sore throat (viral) 11/21/2016  . Vitamin D deficiency 11/12/2016  . Prediabetes 11/12/2016  . Asthma 07/25/2016  . Abdominal pain 06/04/2016  . Otitis, externa, infective 06/04/2016  . Dysuria 03/20/2016  . Gout 11/21/2015  . Obesity, morbid, BMI 50 or higher (HCC) 05/14/2015  . BIPOLAR DISORDER UNSPECIFIED 11/22/2008  . Depression 12/10/2006  . MIGRAINE, UNSPEC., W/O INTRACTABLE MIGRAINE 12/10/2006  . GASTROESOPHAGEAL REFLUX, NO ESOPHAGITIS 12/10/2006  . OSA (obstructive sleep apnea) 12/10/2006    Nedra HaiKristen Ledarius Leeson PT, DPT, CBIS  Supplemental Physical Therapist [facility]   Pager [phone]   [facility] [address] [place], [place], [address] Phone: [phone]   Fax:  [phone]  Name: Catherine [name] MRN: [id number] Date of Birth:  1981/01/23

## 2018-04-23 ENCOUNTER — Encounter

## 2018-04-23 MED FILL — FUROSEMIDE 20 MG TABS: 20 | 30 days supply | Qty: 60 | Fill #2

## 2018-04-27 ENCOUNTER — Encounter: Payer: Self-pay | Admitting: Physical Therapy

## 2018-04-27 ENCOUNTER — Ambulatory Visit: Payer: 59 | Admitting: Physical Therapy

## 2018-04-27 DIAGNOSIS — M25561 Pain in right knee: Secondary | ICD-10-CM | POA: Diagnosis not present

## 2018-04-27 DIAGNOSIS — R262 Difficulty in walking, not elsewhere classified: Secondary | ICD-10-CM | POA: Diagnosis not present

## 2018-04-27 DIAGNOSIS — M25562 Pain in left knee: Secondary | ICD-10-CM

## 2018-04-27 DIAGNOSIS — G8929 Other chronic pain: Secondary | ICD-10-CM

## 2018-04-27 DIAGNOSIS — M25662 Stiffness of left knee, not elsewhere classified: Secondary | ICD-10-CM

## 2018-04-27 DIAGNOSIS — M25661 Stiffness of right knee, not elsewhere classified: Secondary | ICD-10-CM

## 2018-04-27 NOTE — Therapy (Signed)
[facility] [address] [place], [place], [address] Phone: [phone]   Fax:  [phone]  Physical Therapy Treatment  Patient Details  Name: HALANA DEISHER MRN: [id number] Date of Birth: 12/02/1980 Referring Provider: Dr Reino Bellis    Encounter Date: 04/27/2018  PT End of Session - 04/27/18 1628    Visit Number  4    Number of Visits  16    Date for PT Re-Evaluation  05/20/18    Authorization Type  [facility]     PT Start Time  1546    PT Stop Time  1627    PT Time Calculation (min)  41 min    Activity Tolerance  Patient tolerated treatment well    Behavior During Therapy  [facility] for tasks assessed/performed       Past Medical History:  Diagnosis Date  . Allergy   . Anxiety   . Arthritis   . Asthma    since baby, seasonal  . ASTHMA, UNSPECIFIED 12/10/2006   Qualifier: Diagnosis of  By: Abundio Miu    . CELLULITIS AND ABSCESS OF LEG EXCEPT FOOT 12/30/2007   Qualifier: Diagnosis of  By: Daphine Deutscher FNP, Zena Amos    . Cyst of bone    right side of spine  . DEPENDENT EDEMA, LEGS, BILATERAL 11/22/2008   Qualifier: Diagnosis of  By: Barbaraann Barthel MD, [place]    . Depression 2003Dx  . Edema   . Food allergy    citris  . FOOT PAIN, RIGHT 11/22/2008   Qualifier: Diagnosis of  By: Barbaraann Barthel MD, [place]    . Gastric ulcer   . GERD (gastroesophageal reflux disease)   . Gout   . Hypertension    took Norvasc for 3 months, doctor discontinued no longer on BP medications  . Joint pain   . KNEE INJURY, LEFT 01/01/2009   Qualifier: Diagnosis of  By: Delrae Alfred MD, Lanora Manis    . Migraine   . ONYCHOMYCOSIS, TOENAILS 11/22/2008   Qualifier: Diagnosis of  By: Barbaraann Barthel MD, [place]    . Shortness of breath   . Sleep apnea   . TINEA PEDIS 11/22/2008   Qualifier: Diagnosis of  By: Barbaraann Barthel MD, [place]      Past Surgical History:  Procedure Laterality Date  . DILITATION & CURRETTAGE/HYSTROSCOPY WITH NOVASURE ABLATION N/A 07/03/2017    Procedure: DILATATION & CURETTAGE/HYSTEROSCOPY WITH NOVASURE ABLATION;  Surgeon: Genia Del, MD;  Location: [facility];  Service: Gynecology;  Laterality: N/A;  . UPPER GI ENDOSCOPY    . WISDOM TOOTH EXTRACTION  2008   x4    There were no vitals filed for this visit.  Subjective Assessment - 04/27/18 1547    Subjective  I've been feeling pretty good, I have been having some aching in my right leg recently. I do have a little problem at work when I try to stretch my calf muscle and it starts cramping.     Currently in Pain?  Yes    Pain Score  3     Pain Location  Calf    Pain Orientation  Right    Pain Descriptors / Indicators  Spasm;Tightness    Pain Type  Chronic pain    Pain Radiating Towards  none     Pain Onset  More than a month ago    Pain Frequency  Constant    Aggravating Factors   unsure, mostly feel it when sit down after constantly going     Pain Relieving Factors  stretches,  propping it up     Effect of Pain on Daily Activities  moderate                        [facility] Adult PT Treatment/Exercise - 04/27/18 0001      Knee/Hip Exercises: Stretches   Gastroc Stretch  30 seconds;Other (comment) x6      Knee/Hip Exercises: Standing   Other Standing Knee Exercises  standing weight shifts 1x20  with heel raise       Knee/Hip Exercises: Supine   Bridges  Both;1 set;15 reps    Straight Leg Raises  Both;1 set;15 reps    Other Supine Knee/Hip Exercises  ankle DF/PF red TB 1x15 following DN; ankle circles 1x20 CW and CCW following DN       Manual Therapy   Manual Therapy  Soft tissue mobilization    Manual therapy comments  separate from all othe rskilled services     Soft tissue mobilization  STM as tolerated R gastroc muscle belly to tolerance        Trigger Point Dry Needling - 04/27/18 1612    Consent Given?  Yes    Education Handout Provided  No    Muscles Treated Lower Body  Gastrocnemius    Gastrocnemius Response  Twitch response elicited            PT Education - 04/27/18 1628    Education Details  DN education and benefit, HEP updates     Person(s) Educated  Patient    Methods  Explanation    Comprehension  Verbalized understanding       PT Short Term Goals - 03/25/18 1528      PT SHORT TERM GOAL #1   Title  Patient will demostrate full active bilateral knee extension     Time  4    Period  Weeks    Status  New    Target Date  04/22/18      PT SHORT TERM GOAL #2   Title  Patient will increase bilateral lower extremity strnegth to 5/5     Time  4    Period  Weeks    Status  New    Target Date  04/22/18      PT SHORT TERM GOAL #3   Title  Patient will be independent with taping if indicated     Time  4    Period  Weeks    Status  New        PT Long Term Goals - 03/25/18 1529      PT LONG TERM GOAL #1   Title  Patient will go up and down 13 steps without popping or pain in bilateral knees in order to get in and out of her appartment.     Time  8    Period  Weeks    Status  New      PT LONG TERM GOAL #2   Title  Patient will be independent with long term strengthening program in order to protect her knee long term     Time  8    Period  Weeks    Status  New      PT LONG TERM GOAL #3   Title  Patient will demostrate a 41% limitation on FOTO     Time  8    Period  Weeks    Status  New  Plan - 04/27/18 1629    Clinical Impression Statement  Continued with hip strengthening and gastroc stretching, however pain and spasm in R gastroc muscle limited exercise tolerance today. Cramp and spasm in R gastroc quite tender and unrelenting to STM today, so trialed dry needling intervention (performed by dry needling certified clinician Oscar LaKris Leoman PT DPT) to gastroc today. Otherwise performed functional exercise for ankle and general LEs as tolerated this session.     Rehab Potential  Good    PT Frequency  2x / week    PT Duration  8 weeks    PT Treatment/Interventions  ADLs/Self Care Home  Management;Cryotherapy;Electrical Stimulation;Iontophoresis 4mg /ml Dexamethasone;Ultrasound;Stair training;Gait training;Functional mobility training;Therapeutic activities;Therapeutic exercise;Patient/family education;Neuromuscular re-education;Manual techniques;Passive range of motion;Dry needling;Taping    PT Next Visit Plan  continue DN if patient open to this; continue to progress strength     PT Home Exercise Plan  knee extension strtch; gastroc stretch; patellar mobilization, bridges, SLR, standing calf stretch, hip abduction     Consulted and Agree with Plan of Care  Patient       Patient will benefit from skilled therapeutic intervention in order to improve the following deficits and impairments:  Abnormal gait  Visit Diagnosis: Chronic pain of right knee  Chronic pain of left knee  Stiffness of right knee, not elsewhere classified  Stiffness of left knee, not elsewhere classified  Difficulty in walking, not elsewhere classified     Problem List Patient Active Problem List   Diagnosis Date Noted  . Class 3 obesity with serious comorbidity and body mass index (BMI) of 60.0 to 69.9 in adult 06/10/2017  . Fluid retention 06/10/2017  . Menorrhagia 05/18/2017  . Sacral radiculopathy 02/04/2017  . Insulin resistance 02/02/2017  . At risk for violence to self related to depression 02/02/2017  . Morbid obesity (HCC) 01/01/2017  . Sore throat (viral) 11/21/2016  . Vitamin D deficiency 11/12/2016  . Prediabetes 11/12/2016  . Asthma 07/25/2016  . Abdominal pain 06/04/2016  . Otitis, externa, infective 06/04/2016  . Dysuria 03/20/2016  . Gout 11/21/2015  . Obesity, morbid, BMI 50 or higher (HCC) 05/14/2015  . BIPOLAR DISORDER UNSPECIFIED 11/22/2008  . Depression 12/10/2006  . MIGRAINE, UNSPEC., W/O INTRACTABLE MIGRAINE 12/10/2006  . GASTROESOPHAGEAL REFLUX, NO ESOPHAGITIS 12/10/2006  . OSA (obstructive sleep apnea) 12/10/2006    Nedra HaiKristen Shenandoah Yeats PT, DPT,  CBIS  Supplemental Physical Therapist [facility]   Pager [phone]   [facility] [address] [place], [place], [address] Phone: [phone]   Fax:  [phone]  Name: Heywood IlesJansala N Kazee MRN: [id number] Date of Birth: [birth year]

## 2018-04-27 NOTE — Patient Instructions (Signed)
   BRIDGING  While lying on your back with knees bent, tighten your lower abdominals, squeeze your buttocks and then raise your buttocks off the floor/bed as creating a "Bridge" with your body.  Repeat 15 times, twice per day.   STRAIGHT LEG RAISE - SLR  While lying on your back, raise up your leg with a straight knee.  Keep the opposite knee bent with the foot planted on the ground.  Make sure you are squeezing the big muscle on the front of your thigh.  Repeat 15 times each side, twice a day.     STANDING CALF STRETCH  - GASTROCNEMIUS  Start by standing in front of a wall or other sturdy object. Step forward with one foot and maintain your toes on both feet to be pointed straight forward. Keep the leg behind you with a straight knee during the stretch.   Lean forward towards the wall and support yourself with your arms as you allow your front knee to bend until a gentle stretch is felt along the back of your leg that is most behind you.   Move closer or further away from the wall to control the stretch of the back leg. Also you can adjust the bend of the front knee to control the stretch as well.   Hold for 30 seconds, then relax.     HIP ABDUCTION - SIDELYING  While lying on your side, slowly raise up your top leg to the side. Keep your knee straight and maintain your toes pointed forward the entire time. Keep your leg in-line with your body.  The bottom leg can be bent to stabilize your body.  Make sure your hips are rolled forward, not backward.   Repeat 15 times each side, twice a day.

## 2018-04-29 ENCOUNTER — Encounter: Payer: Self-pay | Admitting: Physical Therapy

## 2018-04-29 ENCOUNTER — Ambulatory Visit: Payer: 59 | Admitting: Physical Therapy

## 2018-04-29 DIAGNOSIS — M25661 Stiffness of right knee, not elsewhere classified: Secondary | ICD-10-CM

## 2018-04-29 DIAGNOSIS — M25562 Pain in left knee: Secondary | ICD-10-CM

## 2018-04-29 DIAGNOSIS — M25662 Stiffness of left knee, not elsewhere classified: Secondary | ICD-10-CM

## 2018-04-29 DIAGNOSIS — M25561 Pain in right knee: Principal | ICD-10-CM

## 2018-04-29 DIAGNOSIS — G8929 Other chronic pain: Secondary | ICD-10-CM

## 2018-04-29 DIAGNOSIS — R262 Difficulty in walking, not elsewhere classified: Secondary | ICD-10-CM | POA: Diagnosis not present

## 2018-04-29 NOTE — Therapy (Signed)
[facility] [address] [place], [place], [address] Phone: [phone]   Fax:  [phone]  Physical Therapy Treatment  Patient Details  Name: Catherine [name] MRN: [id number] Date of Birth: 1980-11-27 Referring Provider: Dr Reino Bellisimothy Draper    Encounter Date: 04/29/2018  PT End of Session - 04/29/18 1629    Visit Number  5    Number of Visits  16    Date for PT Re-Evaluation  05/20/18    Authorization Type  [facility]     PT Start Time  1556 heat not included in billing    PT Stop Time  1627    PT Time Calculation (min)  31 min    Activity Tolerance  Patient tolerated treatment well    Behavior During Therapy  [facility] for tasks assessed/performed       Past Medical History:  Diagnosis Date  . Allergy   . Anxiety   . Arthritis   . Asthma    since baby, seasonal  . ASTHMA, UNSPECIFIED 12/10/2006   Qualifier: Diagnosis of  By: Abundio MiuMcGregor, Barbara    . CELLULITIS AND ABSCESS OF LEG EXCEPT FOOT 12/30/2007   Qualifier: Diagnosis of  By: Daphine DeutscherMartin FNP, Zena AmosNykedtra    . Cyst of bone    right side of spine  . DEPENDENT EDEMA, LEGS, BILATERAL 11/22/2008   Qualifier: Diagnosis of  By: Barbaraann Barthelankins MD, [place]    . Depression 2003Dx  . Edema   . Food allergy    citris  . FOOT PAIN, RIGHT 11/22/2008   Qualifier: Diagnosis of  By: Barbaraann Barthelankins MD, [place]    . Gastric ulcer   . GERD (gastroesophageal reflux disease)   . Gout   . Hypertension    took Norvasc for 3 months, doctor discontinued no longer on BP medications  . Joint pain   . KNEE INJURY, LEFT 01/01/2009   Qualifier: Diagnosis of  By: Delrae AlfredMulberry MD, Lanora ManisElizabeth    . Migraine   . ONYCHOMYCOSIS, TOENAILS 11/22/2008   Qualifier: Diagnosis of  By: Barbaraann Barthelankins MD, [place]    . Shortness of breath   . Sleep apnea   . TINEA PEDIS 11/22/2008   Qualifier: Diagnosis of  By: Barbaraann Barthelankins MD, [place]      Past Surgical History:  Procedure Laterality Date  . DILITATION & CURRETTAGE/HYSTROSCOPY WITH NOVASURE  ABLATION N/A 07/03/2017   Procedure: DILATATION & CURETTAGE/HYSTEROSCOPY WITH NOVASURE ABLATION;  Surgeon: Genia DelLavoie, Marie-Ly[place], MD;  Location: [facility];  Service: Gynecology;  Laterality: N/A;  . UPPER GI ENDOSCOPY    . WISDOM TOOTH EXTRACTION  2008   x4    There were no vitals filed for this visit.  Subjective Assessment - 04/29/18 1548    Subjective  I felt OK after the needling last time but I am still having pain. Otherwise nothing new.     Patient Stated Goals  to have less pain     Currently in Pain?  Yes    Pain Score  5     Pain Location  Calf    Pain Orientation  Right    Pain Descriptors / Indicators  Aching                       [facility] Adult PT Treatment/Exercise - 04/29/18 0001      Knee/Hip Exercises: Stretches   Gastroc Stretch  Right;3 reps;30 seconds following heat after DN       Knee/Hip Exercises: Standing   Heel Raises  Both;1 set;15 reps    Lateral Step Up  Both;1 set;10 reps;Step Height: 4";Hand Hold: 1    Forward Step Up  Both;1 set;10 reps;Step Height: 4";Hand Hold: 1    Other Standing Knee Exercises  tandem stance 3x15 seconds       Knee/Hip Exercises: Seated   Sit to Sand  10 reps;without UE support;Other (comment) 2 inch step under L LE for shift to R       Knee/Hip Exercises: Supine   Other Supine Knee/Hip Exercises  ankle DF/PF red TB 1x15 each       Modalities   Modalities  Moist Heat      Moist Heat Therapy   Number Minutes Moist Heat  10 Minutes not included in billing     Moist Heat Location  Other (comment) calf       Trigger Point Dry Needling - 04/29/18 1549    Consent Given?  Yes    Education Handout Provided  No    Muscles Treated Lower Body  Gastrocnemius;Soleus    Gastrocnemius Response  Twitch response elicited;Palpable increased muscle length    Soleus Response  Twitch response elicited;Palpable increased muscle length           PT Education - 04/29/18 1628    Education Details  benefits of DN and mechanics  for muscle relaxation/recovery following DN     Person(s) Educated  Patient    Methods  Explanation    Comprehension  Verbalized understanding       PT Short Term Goals - 03/25/18 1528      PT SHORT TERM GOAL #1   Title  Patient will demostrate full active bilateral knee extension     Time  4    Period  Weeks    Status  New    Target Date  04/22/18      PT SHORT TERM GOAL #2   Title  Patient will increase bilateral lower extremity strnegth to 5/5     Time  4    Period  Weeks    Status  New    Target Date  04/22/18      PT SHORT TERM GOAL #3   Title  Patient will be independent with taping if indicated     Time  4    Period  Weeks    Status  New        PT Long Term Goals - 03/25/18 1529      PT LONG TERM GOAL #1   Title  Patient will go up and down 13 steps without popping or pain in bilateral knees in order to get in and out of her appartment.     Time  8    Period  Weeks    Status  New      PT LONG TERM GOAL #2   Title  Patient will be independent with long term strengthening program in order to protect her knee long term     Time  8    Period  Weeks    Status  New      PT LONG TERM GOAL #3   Title  Patient will demostrate a 41% limitation on FOTO     Time  8    Period  Weeks    Status  New            Plan - 04/29/18 1629    Clinical Impression Statement  Continued with dry needling interventions (performed by DN certified clinician  Oscar La PT DPT) as patient reports some relief and improvement in spasm/tight feelings following this treatment. Applied moist heat immediately following needling (not included in billing). Otherwise continued with functional stretching and strengthening today as tolerated and as time allowed. Pain 0/10 EOS.     Rehab Potential  Good    PT Frequency  2x / week    PT Duration  8 weeks    PT Treatment/Interventions  ADLs/Self Care Home Management;Cryotherapy;Electrical Stimulation;Iontophoresis 4mg /ml  Dexamethasone;Ultrasound;Stair training;Gait training;Functional mobility training;Therapeutic activities;Therapeutic exercise;Patient/family education;Neuromuscular re-education;Manual techniques;Passive range of motion;Dry needling;Taping    PT Next Visit Plan  continue DN if patient open to this; continue to progress strength and balance     PT Home Exercise Plan  knee extension strtch; gastroc stretch; patellar mobilization, bridges, SLR, standing calf stretch, hip abduction     Consulted and Agree with Plan of Care  Patient       Patient will benefit from skilled therapeutic intervention in order to improve the following deficits and impairments:  Abnormal gait  Visit Diagnosis: Chronic pain of right knee  Chronic pain of left knee  Stiffness of right knee, not elsewhere classified  Stiffness of left knee, not elsewhere classified  Difficulty in walking, not elsewhere classified     Problem List Patient Active Problem List   Diagnosis Date Noted  . Class 3 obesity with serious comorbidity and body mass index (BMI) of 60.0 to 69.9 in adult 06/10/2017  . Fluid retention 06/10/2017  . Menorrhagia 05/18/2017  . Sacral radiculopathy 02/04/2017  . Insulin resistance 02/02/2017  . At risk for violence to self related to depression 02/02/2017  . Morbid obesity (HCC) 01/01/2017  . Sore throat (viral) 11/21/2016  . Vitamin D deficiency 11/12/2016  . Prediabetes 11/12/2016  . Asthma 07/25/2016  . Abdominal pain 06/04/2016  . Otitis, externa, infective 06/04/2016  . Dysuria 03/20/2016  . Gout 11/21/2015  . Obesity, morbid, BMI 50 or higher (HCC) 05/14/2015  . BIPOLAR DISORDER UNSPECIFIED 11/22/2008  . Depression 12/10/2006  . MIGRAINE, UNSPEC., W/O INTRACTABLE MIGRAINE 12/10/2006  . GASTROESOPHAGEAL REFLUX, NO ESOPHAGITIS 12/10/2006  . OSA (obstructive sleep apnea) 12/10/2006    Nedra Hai PT, DPT, CBIS  Supplemental Physical Therapist [facility]   Pager  [phone]   [facility] [address] [place], [place], [address] Phone: [phone]   Fax:  [phone]  Name: Catherine [name] MRN: [id number] Date of Birth: 1981/04/05

## 2018-05-03 ENCOUNTER — Ambulatory Visit (INDEPENDENT_AMBULATORY_CARE_PROVIDER_SITE_OTHER): Payer: 59 | Admitting: Family Medicine

## 2018-05-03 ENCOUNTER — Ambulatory Visit: Payer: 59 | Admitting: Podiatry

## 2018-05-03 ENCOUNTER — Encounter: Payer: Self-pay | Admitting: Podiatry

## 2018-05-03 VITALS — BP 115/70 | HR 94 | Temp 97.9°F | Ht 67.0 in | Wt >= 6400 oz

## 2018-05-03 VITALS — BP 116/73 | HR 97

## 2018-05-03 DIAGNOSIS — Z6841 Body Mass Index (BMI) 40.0 and over, adult: Secondary | ICD-10-CM

## 2018-05-03 DIAGNOSIS — Z9189 Other specified personal risk factors, not elsewhere classified: Secondary | ICD-10-CM | POA: Diagnosis not present

## 2018-05-03 DIAGNOSIS — E559 Vitamin D deficiency, unspecified: Secondary | ICD-10-CM | POA: Diagnosis not present

## 2018-05-03 DIAGNOSIS — L600 Ingrowing nail: Secondary | ICD-10-CM | POA: Diagnosis not present

## 2018-05-03 DIAGNOSIS — F3289 Other specified depressive episodes: Secondary | ICD-10-CM

## 2018-05-03 MED ORDER — LIRAGLUTIDE -WEIGHT MANAGEMENT 18 MG/3ML ~~LOC~~ SOPN: 3.0000 mg | PEN_INJECTOR | Freq: Every day | SUBCUTANEOUS | 0 refills | Status: DC

## 2018-05-03 MED ORDER — BUPROPION HCL ER (SR) 200 MG PO TB12: 200.0000 mg | ORAL_TABLET | Freq: Every day | ORAL | 0 refills | Status: DC

## 2018-05-03 MED ORDER — VITAMIN D (ERGOCALCIFEROL) 1.25 MG (50000 UNIT) PO CAPS: 50000.0000 [IU] | ORAL_CAPSULE | ORAL | 0 refills | Status: DC

## 2018-05-03 MED FILL — BUPROPION HCL SR 200 MG TAB: 200 | 30 days supply | Qty: 30 | Fill #0

## 2018-05-03 MED FILL — SAXENDA 18 MG/3 ML PEN: 18 | 30 days supply | Qty: 15 | Fill #0

## 2018-05-03 MED FILL — VIT D2 1.25 MG (50,000 UNIT: 1.25 MG | 28 days supply | Qty: 4 | Fill #0

## 2018-05-03 NOTE — Progress Notes (Signed)
Subjective:   Patient ID: Catherine [name], female   DOB: 37 y.o.   MRN: [id number]   HPI Patient presents with recurrence of spicules around the hallux bilateral with surgery which was done on these approximately 3 years ago   ROS      Objective:  Physical Exam  Neurovascular status intact muscle strength adequate patient found to have a small spicule on the right and left hallux with thickness noted and mild to moderate discomfort when palpated     Assessment:  Reoccurrence of nail disease hallux bilateral with moderate pain     Plan:  I discussed this can occur and at this point I would recommend removing the spicules.  Patient wants surgery and I explained the risk of surgery and allowed to sign consent form and today I infiltrated each hallux 60 mg like Marcaine mixture remove spicules exposed matrix and applied phenol for applications 30 seconds followed by alcohol lavage and sterile dressing.  Gave instructions on soaks and reappoint and advised to call if any issues or questions should come up

## 2018-05-03 NOTE — Patient Instructions (Signed)

## 2018-05-04 NOTE — Progress Notes (Signed)
Office: [phone]  /  Fax: [phone]   HPI:   Chief Complaint: OBESITY Catherine [name] is here to discuss her progress with her obesity treatment plan. She is on the lower carbohydrate, vegetable and lean protein rich diet plan and is following her eating plan approximately 75 % of the time. She states she is exercising 0 minutes 0 times per week. Catherine [name] is not eating everything on the plan. She reports that mentally she struggles with portion sizes. She denies a  correlation to an increase in Saxenda. Her weight is (!) 425 lb (192.8 kg) today and has had a weight gain of 3 pounds over a period of 3 weeks since her last visit. She has lost 43 lbs since starting treatment with [name].  Vitamin D deficiency Catherine [name] has a diagnosis of vitamin D deficiency. She is currently taking vit D and admits fatigue but denies nausea, vomiting or muscle weakness.  At risk for osteopenia and osteoporosis Catherine [name] is at higher risk of osteopenia and osteoporosis due to vitamin D deficiency.   Depression with emotional eating behaviors Catherine [name] is struggling with decreasing food portions. Catherine [name] struggles with emotional eating and using food for comfort to the extent that it is negatively impacting her health. She often snacks when she is not hungry. Catherine [name] sometimes feels she is out of control and then feels guilty that she made poor food choices. She has been working on behavior modification techniques to help reduce her emotional eating and has been somewhat successful. She shows no sign of suicidal or homicidal ideations.  Depression screen Catherine [name] 2/9 01/18/2018 10/28/2017 07/14/2017 05/18/2017 03/24/2017  Decreased Interest 0 0 0 0 0  Down, Depressed, Hopeless 0 0 0 0 0  PHQ - 2 Score 0 0 0 0 0  Altered sleeping - - - - -  Tired, decreased energy - - - - -  Change in appetite - - - - -  Feeling bad or failure about yourself  - - - - -  Trouble concentrating - - - - -  Moving slowly or fidgety/restless - - - - -    Suicidal thoughts - - - - -  PHQ-9 Score - - - - -  Some encounter information is confidential and restricted. Go to Review Flowsheets activity to see all data.     ALLERGIES: Allergies  Allergen Reactions  . Citrus Swelling    MEDICATIONS: Current Outpatient Medications on File Prior to Visit  Medication Sig Dispense Refill  . aspirin-acetaminophen-caffeine (EXCEDRIN MIGRAINE) 250-250-65 MG per tablet Take 2 tablets by mouth 2 (two) times daily as needed for migraine. For headache     . budesonide (PULMICORT) 180 MCG/ACT inhaler Inhale 1 puff into the lungs 2 (two) times daily. 1 Inhaler 1  . cetirizine (ZYRTEC) 10 MG tablet Take 10 mg by mouth daily.    . diclofenac (CATAFLAM) 50 MG tablet Take 50 mg by mouth 2 (two) times daily at 10 AM and 5 PM.    . diclofenac sodium (VOLTAREN) 1 % GEL Apply 2 g topically 4 (four) times daily. 100 g 1  . furosemide (LASIX) 20 MG tablet Take 1 tablet (20 mg total) by mouth 2 (two) times daily. (Patient taking differently: Take 20 mg by mouth daily. ) 60 tablet 3  . gabapentin (NEURONTIN) 300 MG capsule TAKE 2 CAPSULES (600 MG TOTAL) BY MOUTH 3 TIMES A DAY 180 capsule 3  . ibuprofen (ADVIL,MOTRIN) 200 MG tablet Take 600-800 mg by mouth every 8 (eight) hours  as needed (for pain.).     . Insulin Pen Needle (BD PEN NEEDLE NANO U/F) 32G X 4 MM MISC 1 Package by Does not apply route daily at 12 noon. 100 each 0  . meloxicam (MOBIC) 15 MG tablet TAKE 1 TABLET (15 MG TOTAL) BY MOUTH DAILY. 30 tablet 0  . polyethylene glycol powder (GLYCOLAX/MIRALAX) powder Take 17 g by mouth daily as needed. 3350 g 1  . ranitidine (ZANTAC) 150 MG tablet TAKE 1 TABLET BY MOUTH 2 TIMES DAILY. 60 tablet 0   No current facility-administered medications on file prior to visit.     PAST MEDICAL HISTORY: Past Medical History:  Diagnosis Date  . Allergy   . Anxiety   . Arthritis   . Asthma    since baby, seasonal  . ASTHMA, UNSPECIFIED 12/10/2006   Qualifier: Diagnosis  of  By: Abundio Miu    . CELLULITIS AND ABSCESS OF LEG EXCEPT FOOT 12/30/2007   Qualifier: Diagnosis of  By: Daphine Deutscher FNP, Zena Amos    . Cyst of bone    right side of spine  . DEPENDENT EDEMA, LEGS, BILATERAL 11/22/2008   Qualifier: Diagnosis of  By: Barbaraann Barthel MD, [place]    . Depression 2003Dx  . Edema   . Food allergy    citris  . FOOT PAIN, RIGHT 11/22/2008   Qualifier: Diagnosis of  By: Barbaraann Barthel MD, [place]    . Gastric ulcer   . GERD (gastroesophageal reflux disease)   . Gout   . Hypertension    took Norvasc for 3 months, doctor discontinued no longer on BP medications  . Joint pain   . KNEE INJURY, LEFT 01/01/2009   Qualifier: Diagnosis of  By: Delrae Alfred MD, Lanora Manis    . Migraine   . ONYCHOMYCOSIS, TOENAILS 11/22/2008   Qualifier: Diagnosis of  By: Barbaraann Barthel MD, [place]    . Shortness of breath   . Sleep apnea   . TINEA PEDIS 11/22/2008   Qualifier: Diagnosis of  By: Barbaraann Barthel MD, Benetta Spar      PAST SURGICAL HISTORY: Past Surgical History:  Procedure Laterality Date  . DILITATION & CURRETTAGE/HYSTROSCOPY WITH NOVASURE ABLATION N/A 07/03/2017   Procedure: DILATATION & CURETTAGE/HYSTEROSCOPY WITH NOVASURE ABLATION;  Surgeon: Genia Del, MD;  Location: [facility];  Service: Gynecology;  Laterality: N/A;  . UPPER GI ENDOSCOPY    . WISDOM TOOTH EXTRACTION  2008   x4    SOCIAL HISTORY: Social History   Tobacco Use  . Smoking status: Former Smoker    Packs/day: 0.50    Years: 18.00    Pack years: 9.00    Types: Cigarettes    Start date: 10/13/1996    Last attempt to quit: 01/12/2015    Years since quitting: 3.3  . Smokeless tobacco: Never Used  Substance Use Topics  . Alcohol use: Yes    Alcohol/week: 0.6 oz    Types: 1 Standard drinks or equivalent per week    Comment: rarely drinks  . Drug use: No    Comment: marjuana - former use, quit 4 years ago    FAMILY HISTORY: Family History  Problem Relation Age of Onset  . Hypertension Mother   . Hyperlipidemia  Mother   . Cancer Mother        bone cancer  . Heart disease Mother   . Sudden death Mother   . Stroke Mother   . Thyroid disease Mother   . Sleep apnea Mother   . Obesity Mother   . Sudden death Father   .  Hypertension Father   . Heart disease Father   . Hyperlipidemia Father   . Prostate cancer Father   . Lung cancer Father   . Obesity Father   . Alcoholism Father   . Sudden death Brother   . Hypertension Brother   . Hyperlipidemia Brother   . Heart attack Brother   . Heart disease Brother   . Stroke Brother   . Alcoholism Brother   . Drug abuse Brother   . Heart disease Sister   . Hypertension Sister   . Thyroid cancer Sister   . Diabetes Neg Hx   . Colon cancer Neg Hx   . Stomach cancer Neg Hx   . Esophageal cancer Neg Hx   . Rectal cancer Neg Hx   . Liver cancer Neg Hx     ROS: Review of Systems  Constitutional: Positive for malaise/fatigue. Negative for weight loss.  Gastrointestinal: Negative for nausea and vomiting.  Musculoskeletal:       Negative for muscle weakness  Psychiatric/Behavioral: Positive for depression. Negative for suicidal ideas.    PHYSICAL EXAM: Blood pressure 115/70, pulse 94, temperature 97.9 F (36.6 C), temperature source Oral, height 5\' 7"  (1.702 m), weight (!) 425 lb (192.8 kg), SpO2 98 %. Body mass index is 66.56 kg/m. Physical Exam  Constitutional: She is oriented to person, place, and time. She appears well-developed and well-nourished.  Cardiovascular: Normal rate.  Pulmonary/Chest: Effort normal.  Musculoskeletal: Normal range of motion.  Neurological: She is oriented to person, place, and time.  Skin: Skin is warm and dry.  Psychiatric: She has a normal mood and affect. Her behavior is normal.  Vitals reviewed.   RECENT LABS AND TESTS: BMET    Component Value Date/Time   NA 140 12/10/2017 1242   K 3.7 12/10/2017 1242   CL 105 12/10/2017 1242   CO2 19 (L) 12/10/2017 1242   GLUCOSE 83 12/10/2017 1242   GLUCOSE  83 06/04/2016 1155   BUN 11 12/10/2017 1242   CREATININE 0.81 12/10/2017 1242   CREATININE 0.90 06/04/2016 1155   CALCIUM 9.0 12/10/2017 1242   GFRNONAA 94 12/10/2017 1242   GFRNONAA 84 06/04/2016 1155   GFRAA 108 12/10/2017 1242   GFRAA >89 06/04/2016 1155   Lab Results  Component Value Date   HGBA1C 5.3 12/10/2017   HGBA1C 5.4 06/25/2017   HGBA1C 5.2 05/19/2017   HGBA1C 5.4 01/20/2017   HGBA1C 5.6 10/27/2016   Lab Results  Component Value Date   INSULIN 43.6 (H) 12/10/2017   INSULIN 30.4 (H) 06/25/2017   INSULIN 29.7 (H) 01/20/2017   INSULIN 30.1 (H) 10/27/2016   CBC    Component Value Date/Time   WBC 10.6 12/10/2017 1242   WBC 13.3 (H) 07/03/2017 0610   RBC 4.64 12/10/2017 1242   RBC 4.33 07/03/2017 0610   HGB 12.5 12/10/2017 1242   HCT 38.4 12/10/2017 1242   PLT 293 07/03/2017 0610   PLT 343 05/18/2017 1611   MCV 83 12/10/2017 1242   MCH 26.9 12/10/2017 1242   MCH 25.9 (L) 07/03/2017 0610   MCHC 32.6 12/10/2017 1242   MCHC 32.4 07/03/2017 0610   RDW 16.2 (H) 12/10/2017 1242   LYMPHSABS 5.2 (H) 12/10/2017 1242   MONOABS 0.6 07/03/2017 0610   EOSABS 0.0 12/10/2017 1242   BASOSABS 0.0 12/10/2017 1242   Iron/TIBC/Ferritin/ %Sat    Component Value Date/Time   IRON 25 (L) 05/18/2017 1611   TIBC 363 05/18/2017 1611   FERRITIN 16 05/18/2017 1611  IRONPCTSAT 7 (LL) 05/18/2017 1611   Lipid Panel     Component Value Date/Time   CHOL 152 10/27/2016 1058   TRIG 57 10/27/2016 1058   HDL 43 10/27/2016 1058   CHOLHDL 3.6 11/21/2015 1047   VLDL 10 11/21/2015 1047   LDLCALC 98 10/27/2016 1058   Hepatic Function Panel     Component Value Date/Time   PROT 7.2 12/10/2017 1242   ALBUMIN 4.2 12/10/2017 1242   AST 22 12/10/2017 1242   ALT 27 12/10/2017 1242   ALKPHOS 62 12/10/2017 1242   BILITOT 0.5 12/10/2017 1242      Component Value Date/Time   TSH 1.43 05/19/2017 1245   TSH 1.460 10/27/2016 1058   TSH 0.96 11/21/2015 1047   Results for ABIOLA, BEHRING (MRN [id number]) as of 05/04/2018 10:13  Ref. Range 12/10/2017 12:42  Vitamin D, 25-Hydroxy Latest Ref Range: 30.0 - 100.0 ng/mL 28.5 (L)   ASSESSMENT AND PLAN: Vitamin D deficiency - Plan: Vitamin D, Ergocalciferol, (DRISDOL) 50000 units CAPS capsule  Other depression - with emotional eating - Plan: buPROPion (WELLBUTRIN SR) 200 MG 12 hr tablet  At risk for osteoporosis  Class 3 severe obesity with serious comorbidity and body mass index (BMI) of 60.0 to 69.9 in adult, unspecified obesity type (HCC) - Plan: Liraglutide -Weight Management (SAXENDA) 18 MG/3ML SOPN  PLAN:  Vitamin D Deficiency Haddy was informed that low vitamin D levels contributes to fatigue and are associated with obesity, breast, and colon cancer. She agrees to continue to take prescription Vit D @50 ,000 IU every week #4 with no refills and will follow up for routine testing of vitamin D, at least 2-3 times per year. She was informed of the risk of over-replacement of vitamin D and agrees to not increase her dose unless she discusses this with [name] first. Tessah agrees to follow up as directed.  At risk for osteopenia and osteoporosis Azzie is at risk for osteopenia and osteoporosis due to her vitamin D deficiency. She was encouraged to take her vitamin D and follow her higher calcium diet and increase strengthening exercise to help strengthen her bones and decrease her risk of osteopenia and osteoporosis.  Depression with Emotional Eating Behaviors We discussed behavior modification techniques today to help Kevina deal with her emotional eating and depression. She has agreed to continue Wellbutrin SR 200 mg qd #30 with no refills and follow up as directed.  Obesity Talesha is currently in the action stage of change. As such, her goal is to continue with weight loss efforts She has agreed to follow the Category 3 plan Krista has been instructed to work up to a goal of 150 minutes of combined cardio and strengthening  exercise per week for weight loss and overall health benefits. We discussed the following Behavioral Modification Strategies today: planning for success, increasing lean protein intake, increasing vegetables and work on meal planning and easy cooking plans  We discussed various medication options to help Sandralee with her weight loss efforts and we both agreed to continue Saxenda 3 mg subQ daily #5 with no refills  Joselyn has agreed to follow up with our clinic in 2 weeks. She was informed of the importance of frequent follow up visits to maximize her success with intensive lifestyle modifications for her multiple health conditions.  I, [place] Crane, am acting as transcriptionist for Filbert Schilder, MD  I have reviewed the above documentation for accuracy and completeness, and I agree with the above. - Debbra Riding, MD

## 2018-05-05 ENCOUNTER — Encounter: Payer: Self-pay | Admitting: Physical Therapy

## 2018-05-05 ENCOUNTER — Ambulatory Visit: Payer: 59 | Admitting: Physical Therapy

## 2018-05-05 DIAGNOSIS — M25562 Pain in left knee: Secondary | ICD-10-CM | POA: Diagnosis not present

## 2018-05-05 DIAGNOSIS — M25561 Pain in right knee: Principal | ICD-10-CM

## 2018-05-05 DIAGNOSIS — M25662 Stiffness of left knee, not elsewhere classified: Secondary | ICD-10-CM

## 2018-05-05 DIAGNOSIS — M25661 Stiffness of right knee, not elsewhere classified: Secondary | ICD-10-CM

## 2018-05-05 DIAGNOSIS — R262 Difficulty in walking, not elsewhere classified: Secondary | ICD-10-CM | POA: Diagnosis not present

## 2018-05-05 DIAGNOSIS — G8929 Other chronic pain: Secondary | ICD-10-CM | POA: Diagnosis not present

## 2018-05-06 NOTE — Therapy (Signed)
[facility] [address] [place], [place], [address] Phone: [phone]   Fax:  [phone]  Physical Therapy Treatment  Patient Details  Name: Catherine [name] MRN: [id number] Date of Birth: 1980/10/23 Referring Provider: Dr Reino Bellis    Encounter Date: 05/05/2018   PT End of Session - 05/06/18 1303    Visit Number  6    Number of Visits  16    Date for PT Re-Evaluation  05/20/18    Authorization Type  [facility]     PT Start Time  1545    PT Stop Time  1626    PT Time Calculation (min)  41 min    Activity Tolerance  Patient tolerated treatment well    Behavior During Therapy  [facility] for tasks assessed/performed       Past Medical History:  Diagnosis Date  . Allergy   . Anxiety   . Arthritis   . Asthma    since baby, seasonal  . ASTHMA, UNSPECIFIED 12/10/2006   Qualifier: Diagnosis of  By: Abundio Miu    . CELLULITIS AND ABSCESS OF LEG EXCEPT FOOT 12/30/2007   Qualifier: Diagnosis of  By: Daphine Deutscher FNP, Zena Amos    . Cyst of bone    right side of spine  . DEPENDENT EDEMA, LEGS, BILATERAL 11/22/2008   Qualifier: Diagnosis of  By: Barbaraann Barthel MD, [place]    . Depression 2003Dx  . Edema   . Food allergy    citris  . FOOT PAIN, RIGHT 11/22/2008   Qualifier: Diagnosis of  By: Barbaraann Barthel MD, [place]    . Gastric ulcer   . GERD (gastroesophageal reflux disease)   . Gout   . Hypertension    took Norvasc for 3 months, doctor discontinued no longer on BP medications  . Joint pain   . KNEE INJURY, LEFT 01/01/2009   Qualifier: Diagnosis of  By: Delrae Alfred MD, Lanora Manis    . Migraine   . ONYCHOMYCOSIS, TOENAILS 11/22/2008   Qualifier: Diagnosis of  By: Barbaraann Barthel MD, [place]    . Shortness of breath   . Sleep apnea   . TINEA PEDIS 11/22/2008   Qualifier: Diagnosis of  By: Barbaraann Barthel MD, [place]      Past Surgical History:  Procedure Laterality Date  . DILITATION & CURRETTAGE/HYSTROSCOPY WITH NOVASURE ABLATION N/A 07/03/2017   Procedure: DILATATION & CURETTAGE/HYSTEROSCOPY WITH NOVASURE ABLATION;  Surgeon: Genia Del, MD;  Location: [facility];  Service: Gynecology;  Laterality: N/A;  . UPPER GI ENDOSCOPY    . WISDOM TOOTH EXTRACTION  2008   x4    There were no vitals filed for this visit.  Subjective Assessment - 05/05/18 1550    Subjective  Patient reports her knees are doing pretty good today. She is still having some pain but it is improving. She continues to have pain in the area between her calf and the bend of her knee but declined further needling of that area at least for today.     Limitations  Standing;Walking    How long can you sit comfortably?  Stiffnes when she sits    How long can you stand comfortably?  45 minutes    How long can you walk comfortably?  Can walk around using the shopping cart for assistance     Diagnostic tests  X-ray right: advanced tri-compartmental arthritis     Patient Stated Goals  to have less pain     Pain Score  3     Pain Orientation  Right    Pain Descriptors / Indicators  Aching    Pain Type  Chronic pain    Pain Onset  More than a month ago    Pain Frequency  Constant    Aggravating Factors   sitting down after walking a lot     Pain Relieving Factors  stretches     Effect of Pain on Daily Activities  moderate                                PT Education - 05/06/18 1303    Education Details  reviewed the benefits and risks of TPDN     Person(s) Educated  Patient    Methods  Explanation;Demonstration;Tactile cues;Verbal cues    Comprehension  Verbalized understanding;Returned demonstration;Verbal cues required;Tactile cues required       PT Short Term Goals - 03/25/18 1528      PT SHORT TERM GOAL #1   Title  Patient will demostrate full active bilateral knee extension     Time  4    Period  Weeks    Status  New    Target Date  04/22/18      PT SHORT TERM GOAL #2   Title  Patient will increase bilateral lower extremity  strnegth to 5/5     Time  4    Period  Weeks    Status  New    Target Date  04/22/18      PT SHORT TERM GOAL #3   Title  Patient will be independent with taping if indicated     Time  4    Period  Weeks    Status  New        PT Long Term Goals - 03/25/18 1529      PT LONG TERM GOAL #1   Title  Patient will go up and down 13 steps without popping or pain in bilateral knees in order to get in and out of her appartment.     Time  8    Period  Weeks    Status  New      PT LONG TERM GOAL #2   Title  Patient will be independent with long term strengthening program in order to protect her knee long term     Time  8    Period  Weeks    Status  New      PT LONG TERM GOAL #3   Title  Patient will demostrate a 41% limitation on FOTO     Time  8    Period  Weeks    Status  New            Plan - 05/06/18 1305    Clinical Impression Statement  Patient declined needling this visit. Therapy perfromed soft tissue work to her right lateral calf. She appears to have a calf strain. She is also perfroming at home. She tolerated standing exercises well.     Clinical Presentation  Evolving    Clinical Decision Making  Moderate    Rehab Potential  Good    PT Frequency  2x / week    PT Duration  8 weeks    PT Treatment/Interventions  ADLs/Self Care Home Management;Cryotherapy;Electrical Stimulation;Iontophoresis 4mg /ml Dexamethasone;Ultrasound;Stair training;Gait training;Functional mobility training;Therapeutic activities;Therapeutic exercise;Patient/family education;Neuromuscular re-education;Manual techniques;Passive range of motion;Dry needling;Taping    PT Next Visit Plan  continue DN if patient open to this; continue to  progress strength and balance     PT Home Exercise Plan  knee extension strtch; gastroc stretch; patellar mobilization, bridges, SLR, standing calf stretch, hip abduction     Consulted and Agree with Plan of Care  Patient       Patient will benefit from skilled  therapeutic intervention in order to improve the following deficits and impairments:     Visit Diagnosis: Chronic pain of right knee  Chronic pain of left knee  Stiffness of right knee, not elsewhere classified  Stiffness of left knee, not elsewhere classified  Difficulty in walking, not elsewhere classified     Problem List Patient Active Problem List   Diagnosis Date Noted  . Class 3 obesity with serious comorbidity and body mass index (BMI) of 60.0 to 69.9 in adult 06/10/2017  . Fluid retention 06/10/2017  . Menorrhagia 05/18/2017  . Sacral radiculopathy 02/04/2017  . Insulin resistance 02/02/2017  . At risk for violence to self related to depression 02/02/2017  . Morbid obesity (HCC) 01/01/2017  . Sore throat (viral) 11/21/2016  . Vitamin D deficiency 11/12/2016  . Prediabetes 11/12/2016  . Asthma 07/25/2016  . Abdominal pain 06/04/2016  . Otitis, externa, infective 06/04/2016  . Dysuria 03/20/2016  . Gout 11/21/2015  . Obesity, morbid, BMI 50 or higher (HCC) 05/14/2015  . BIPOLAR DISORDER UNSPECIFIED 11/22/2008  . Depression 12/10/2006  . MIGRAINE, UNSPEC., W/O INTRACTABLE MIGRAINE 12/10/2006  . GASTROESOPHAGEAL REFLUX, NO ESOPHAGITIS 12/10/2006  . OSA (obstructive sleep apnea) 12/10/2006    Dessie Comaavid J Karriem Muench PT DPT  05/06/2018, 1:07 PM  [facility] [address] [place], [place], [address] Phone: [phone]   Fax:  [phone]  Name: Catherine [name] MRN: [id number] Date of Birth: Mar 09, 1981

## 2018-05-07 ENCOUNTER — Encounter: Payer: 59 | Admitting: Physical Therapy

## 2018-05-10 ENCOUNTER — Telehealth: Payer: Self-pay | Admitting: Family Medicine

## 2018-05-10 ENCOUNTER — Telehealth: Payer: Self-pay | Admitting: Podiatry

## 2018-05-10 NOTE — Telephone Encounter (Signed)
I saw Dr. Charlsie Merlesegal last Monday and he did a procedure on me. I have some questions about my toenails. At your earliest convenience please call me back at [phone]. Thank you.

## 2018-05-10 NOTE — Telephone Encounter (Signed)
Pt states she had a toenail procedure last Monday and wanted to know if she could shower now, she would continue the soaks as ordered. I told pt, she could shower now, but to do the soaks after the shower.

## 2018-05-10 NOTE — Telephone Encounter (Signed)
Handicap placard form dropped off for at front desk for completion.  Verified that patient section of form has been completed.  Last DOS/WCC with PCP was 01/18/18.  Placed form in white team folder to be completed by clinical staff.  Lina Sarheryl A Stanley

## 2018-05-11 ENCOUNTER — Ambulatory Visit: Payer: 59 | Admitting: Physical Therapy

## 2018-05-11 NOTE — Telephone Encounter (Signed)
Clinical info completed on Handicap form.  Place form in Dr. Carollee SiresLockamy's box for completion.  Catherine [name], April D, [place]

## 2018-05-12 ENCOUNTER — Ambulatory Visit: Payer: 59 | Admitting: Physical Therapy

## 2018-05-12 ENCOUNTER — Encounter: Payer: Self-pay | Admitting: Physical Therapy

## 2018-05-12 DIAGNOSIS — M25662 Stiffness of left knee, not elsewhere classified: Secondary | ICD-10-CM | POA: Diagnosis not present

## 2018-05-12 DIAGNOSIS — R262 Difficulty in walking, not elsewhere classified: Secondary | ICD-10-CM | POA: Diagnosis not present

## 2018-05-12 DIAGNOSIS — M25562 Pain in left knee: Secondary | ICD-10-CM | POA: Diagnosis not present

## 2018-05-12 DIAGNOSIS — M25561 Pain in right knee: Principal | ICD-10-CM

## 2018-05-12 DIAGNOSIS — G8929 Other chronic pain: Secondary | ICD-10-CM

## 2018-05-12 DIAGNOSIS — M25661 Stiffness of right knee, not elsewhere classified: Secondary | ICD-10-CM | POA: Diagnosis not present

## 2018-05-12 NOTE — Therapy (Signed)
[place], [place], [address] Phone: [phone]   Fax:  [phone]  Physical Therapy Treatment  Patient Details  Name: Catherine [name] MRN: [id number] Date of Birth: 27-Jul-1981 Referring Provider: Dr Lilia Argue    Encounter Date: 05/12/2018  PT End of Session - 05/12/18 0824    Visit Number  7    Number of Visits  16    Date for PT Re-Evaluation  05/20/18    Authorization Type  [facility]     PT Start Time  0802    PT Stop Time  0845    PT Time Calculation (min)  43 min    Activity Tolerance  Patient tolerated treatment well    Behavior During Therapy  [facility] for tasks assessed/performed       Past Medical History:  Diagnosis Date  . Allergy   . Anxiety   . Arthritis   . Asthma    since baby, seasonal  . ASTHMA, UNSPECIFIED 12/10/2006   Qualifier: Diagnosis of  By: Eusebio Friendly    . CELLULITIS AND ABSCESS OF LEG EXCEPT FOOT 12/30/2007   Qualifier: Diagnosis of  By: Hassell Done FNP, Tori Milks    . Cyst of bone    right side of spine  . DEPENDENT EDEMA, LEGS, BILATERAL 11/22/2008   Qualifier: Diagnosis of  By: Radene Ou MD, [place]    . Depression 2003Dx  . Edema   . Food allergy    citris  . FOOT PAIN, RIGHT 11/22/2008   Qualifier: Diagnosis of  By: Radene Ou MD, [place]    . Gastric ulcer   . GERD (gastroesophageal reflux disease)   . Gout   . Hypertension    took Norvasc for 3 months, doctor discontinued no longer on BP medications  . Joint pain   . KNEE INJURY, LEFT 01/01/2009   Qualifier: Diagnosis of  By: Amil Amen MD, Benjamine Mola    . Migraine   . ONYCHOMYCOSIS, TOENAILS 11/22/2008   Qualifier: Diagnosis of  By: Radene Ou MD, [place]    . Shortness of breath   . Sleep apnea   . TINEA PEDIS 11/22/2008   Qualifier: Diagnosis of  By: Radene Ou MD, [place]      Past Surgical History:  Procedure Laterality Date  . DILITATION & CURRETTAGE/HYSTROSCOPY WITH NOVASURE ABLATION N/A 07/03/2017   Procedure: DILATATION & CURETTAGE/HYSTEROSCOPY WITH NOVASURE ABLATION;  Surgeon: Princess Bruins, MD;  Location: [facility];  Service: Gynecology;  Laterality: N/A;  . UPPER GI ENDOSCOPY    . WISDOM TOOTH EXTRACTION  2008   x4    There were no vitals filed for this visit.  Subjective Assessment - 05/12/18 0811    Subjective  Patient reports that last week and yesterday she had significant pain running from her hoips down her legs. The pain is better today but she is still having pain into her calf.     Limitations  Standing;Walking    How long can you sit comfortably?  Stiffnes when she sits    How long can you stand comfortably?  45 minutes    How long can you walk comfortably?  Can walk around using the shopping cart for assistance     Diagnostic tests  X-ray right: advanced tri-compartmental arthritis     Patient Stated Goals  to have less pain     Currently in Pain?  Yes    Pain Score  4     Pain Location  Knee    Pain Orientation  Right;Posterior    Pain Descriptors / Indicators  Aching    Pain Type  Chronic pain    Pain Onset  More than a month ago    Pain Frequency  Constant    Aggravating Factors   sitting down after walking a lot     Pain Relieving Factors  stretches     Effect of Pain on Daily Activities  moderate                       [facility] Adult PT Treatment/Exercise - 05/12/18 0001      Knee/Hip Exercises: Stretches   Active Hamstring Stretch  Both;2 reps;30 seconds    Gastroc Stretch  Right;3 reps;30 seconds      Knee/Hip Exercises: Aerobic   Nustep  5 min       Knee/Hip Exercises: Standing   Forward Step Up  Both;1 set;10 reps;Step Height: 4";Hand Hold: 1      Knee/Hip Exercises: Supine   Short Arc Quad Sets  Both;1 set;15 reps    Bridges  Both;1 set;15 reps    Straight Leg Raises  Both;1 set;15 reps    Other Supine Knee/Hip Exercises  ankle DF/PF red TB 1x15 each       Moist Heat Therapy   Number Minutes Moist Heat  10 Minutes    Moist  Heat Location  Other (comment)      Manual Therapy   Manual Therapy  Soft tissue mobilization    Soft tissue mobilization  STM as tolerated R gastroc muscle belly to tolerance              PT Education - 05/12/18 0817    Education Details  reviewed stretches and technique with exercises     Person(s) Educated  Patient    Methods  Explanation;Demonstration;Tactile cues;Verbal cues;Handout    Comprehension  Verbalized understanding;Returned demonstration       PT Short Term Goals - 05/12/18 0916      PT SHORT TERM GOAL #1   Title  Patient will demostrate full active bilateral knee extension     Baseline  still limited but improved     Time  4    Period  Weeks    Status  On-going      PT SHORT TERM GOAL #2   Title  Patient will increase bilateral lower extremity strnegth to 5/5     Baseline  progressing     Status  Not Met      PT SHORT TERM GOAL #3   Title  Patient will be independent with taping if indicated     Baseline  No back pain/ radiation.      Time  4    Period  Weeks    Status  On-going      PT SHORT TERM GOAL #4   Title  Patient will be independent with basic HEP     Period  Weeks    Status  Achieved        PT Long Term Goals - 03/25/18 1529      PT LONG TERM GOAL #1   Title  Patient will go up and down 13 steps without popping or pain in bilateral knees in order to get in and out of her appartment.     Time  8    Period  Weeks    Status  New      PT LONG TERM GOAL #2   Title  Patient will  be independent with long term strengthening program in order to protect her knee long term     Time  8    Period  Weeks    Status  New      PT LONG TERM GOAL #3   Title  Patient will demostrate a 41% limitation on FOTO     Time  8    Period  Weeks    Status  New            Plan - 05/12/18 8588    Clinical Impression Statement  Patient had asetback last week but she also reported significant swelling in her legs. She had been makingprogress up  until that point. She tolerated exercises well today. She has an area of tightness and pain inthe top of her calf still but the spot is ssmaller and less sensative. She would benefit from further therapy 20-3x a week fo r4 weeks. She will need anERO next week or the week after.     Clinical Presentation  Evolving    Clinical Decision Making  Moderate    Rehab Potential  Good    PT Frequency  2x / week    PT Duration  8 weeks    PT Treatment/Interventions  ADLs/Self Care Home Management;Cryotherapy;Electrical Stimulation;Iontophoresis 74m/ml Dexamethasone;Ultrasound;Stair training;Gait training;Functional mobility training;Therapeutic activities;Therapeutic exercise;Patient/family education;Neuromuscular re-education;Manual techniques;Passive range of motion;Dry needling;Taping    PT Next Visit Plan  continue DN if patient open to this; continue to progress strength and balance     PT Home Exercise Plan  knee extension strtch; gastroc stretch; patellar mobilization, bridges, SLR, standing calf stretch, hip abduction     Consulted and Agree with Plan of Care  Patient       Patient will benefit from skilled therapeutic intervention in order to improve the following deficits and impairments:  Abnormal gait  Visit Diagnosis: Chronic pain of right knee  Chronic pain of left knee  Stiffness of right knee, not elsewhere classified  Stiffness of left knee, not elsewhere classified  Difficulty in walking, not elsewhere classified     Problem List Patient Active Problem List   Diagnosis Date Noted  . Class 3 obesity with serious comorbidity and body mass index (BMI) of 60.0 to 69.9 in adult 06/10/2017  . Fluid retention 06/10/2017  . Menorrhagia 05/18/2017  . Sacral radiculopathy 02/04/2017  . Insulin resistance 02/02/2017  . At risk for violence to self related to depression 02/02/2017  . Morbid obesity ([place] 01/01/2017  . Sore throat (viral) 11/21/2016  . Vitamin D deficiency  11/12/2016  . Prediabetes 11/12/2016  . Asthma 07/25/2016  . Abdominal pain 06/04/2016  . Otitis, externa, infective 06/04/2016  . Dysuria 03/20/2016  . Gout 11/21/2015  . Obesity, morbid, BMI 50 or higher ([place] 05/14/2015  . BIPOLAR DISORDER UNSPECIFIED 11/22/2008  . Depression 12/10/2006  . MIGRAINE, UNSPEC., W/O INTRACTABLE MIGRAINE 12/10/2006  . GASTROESOPHAGEAL REFLUX, NO ESOPHAGITIS 12/10/2006  . OSA (obstructive sleep apnea) 12/10/2006    DCarney LivingPT DPT  05/12/2018, 9:19 AM  [facility] [address]Phone: [phone]  Fax:  [phone] Name: Catherine CANIZARESMRN: [id number] of Birth: 226-Nov-1982

## 2018-05-12 NOTE — Telephone Encounter (Signed)
Patient calling to inquire about this form. Her current placard expires today. Please complete as quickly as possible.  Call back is [phone]  Ples SpecterAlisa Brake, RN [facility] RN)

## 2018-05-13 NOTE — Telephone Encounter (Signed)
Form has been completed and is in the RN folder.  Thanks!

## 2018-05-13 NOTE — Telephone Encounter (Signed)
Up front for patient pick up. Placed there by Nehemiah SettleBrooke. Patient aware. Ples SpecterAlisa Jahquez Steffler, RN [facility] RN)

## 2018-05-20 ENCOUNTER — Other Ambulatory Visit: Payer: Self-pay | Admitting: *Deleted

## 2018-05-20 DIAGNOSIS — M25562 Pain in left knee: Secondary | ICD-10-CM

## 2018-05-20 DIAGNOSIS — M25561 Pain in right knee: Secondary | ICD-10-CM

## 2018-05-20 MED ORDER — MELOXICAM 15 MG PO TABS: 15.0000 mg | ORAL_TABLET | Freq: Every day | ORAL | 0 refills | Status: DC

## 2018-05-20 MED ORDER — RANITIDINE HCL 150 MG PO TABS: 150.0000 mg | ORAL_TABLET | Freq: Two times a day (BID) | ORAL | 0 refills | Status: DC

## 2018-05-20 MED FILL — raNITIdine HCL 150 MG TABS: 150 | 30 days supply | Qty: 60 | Fill #0

## 2018-05-20 MED FILL — MELOXICAM 15 MG TABLET: 15 | 30 days supply | Qty: 30 | Fill #0

## 2018-05-24 ENCOUNTER — Ambulatory Visit (INDEPENDENT_AMBULATORY_CARE_PROVIDER_SITE_OTHER): Payer: 59 | Admitting: Physician Assistant

## 2018-05-24 VITALS — BP 107/73 | HR 96 | Temp 98.4°F | Ht 67.0 in | Wt >= 6400 oz

## 2018-05-24 DIAGNOSIS — E7849 Other hyperlipidemia: Secondary | ICD-10-CM | POA: Diagnosis not present

## 2018-05-24 DIAGNOSIS — E8881 Metabolic syndrome: Secondary | ICD-10-CM

## 2018-05-24 DIAGNOSIS — E559 Vitamin D deficiency, unspecified: Secondary | ICD-10-CM

## 2018-05-24 DIAGNOSIS — Z6841 Body Mass Index (BMI) 40.0 and over, adult: Secondary | ICD-10-CM

## 2018-05-24 DIAGNOSIS — Z9189 Other specified personal risk factors, not elsewhere classified: Secondary | ICD-10-CM

## 2018-05-24 MED ORDER — VITAMIN D (ERGOCALCIFEROL) 1.25 MG (50000 UNIT) PO CAPS: 50000.0000 [IU] | ORAL_CAPSULE | ORAL | 0 refills | Status: DC

## 2018-05-24 NOTE — Progress Notes (Signed)
Office: [phone]  /  Fax: [phone]   HPI:   Chief Complaint: OBESITY Catherine [name] is here to discuss her progress with her obesity treatment plan. She is on the Category 3 plan and is following her eating plan approximately 80 % of the time. She states she is exercising 0 minutes 0 times per week. Catherine [name] did well with weight loss. She reports getting back on track the past few weeks and following the plan closely. She is requesting additional breakfast ideas. Her weight is (!) 420 lb (190.5 kg) today and has had a weight loss of 5 pounds over a period of 3 weeks since her last visit. She has lost 48 lbs since starting treatment with [name].  Vitamin D deficiency Catherine [name] has a diagnosis of vitamin D deficiency. She is currently taking prescription vit D and denies nausea, vomiting or muscle weakness.  Insulin Resistance Catherine [name] has a diagnosis of insulin resistance based on her elevated fasting insulin level >5. Although Catherine [name]'s blood glucose readings are still under good control, insulin resistance puts her at greater risk of metabolic syndrome and diabetes. She is not taking metformin currently and continues to work on diet and exercise to decrease risk of diabetes. She denies polyphagia or hypoglycemia.  Hyperlipidemia Catherine [name] has hyperlipidemia and has been trying to improve her cholesterol levels with intensive lifestyle modification including a low saturated fat diet, exercise and weight loss. She denies any chest pain, claudication or myalgias. Catherine [name] is not on medicine to treat hyperlipidemia.  At risk for osteopenia and osteoporosis Catherine [name] is at higher risk of osteopenia and osteoporosis due to vitamin D deficiency.   ALLERGIES: Allergies  Allergen Reactions  . Citrus Swelling    MEDICATIONS: Current Outpatient Medications on File Prior to Visit  Medication Sig Dispense Refill  . aspirin-acetaminophen-caffeine (EXCEDRIN MIGRAINE) 250-250-65 MG per tablet Take 2 tablets  by mouth 2 (two) times daily as needed for migraine. For headache     . budesonide (PULMICORT) 180 MCG/ACT inhaler Inhale 1 puff into the lungs 2 (two) times daily. 1 Inhaler 1  . buPROPion (WELLBUTRIN SR) 200 MG 12 hr tablet Take 1 tablet (200 mg total) by mouth daily at 12 noon. 30 tablet 0  . cetirizine (ZYRTEC) 10 MG tablet Take 10 mg by mouth daily.    . diclofenac (CATAFLAM) 50 MG tablet Take 50 mg by mouth 2 (two) times daily at 10 AM and 5 PM.    . diclofenac sodium (VOLTAREN) 1 % GEL Apply 2 g topically 4 (four) times daily. 100 g 1  . furosemide (LASIX) 20 MG tablet Take 1 tablet (20 mg total) by mouth 2 (two) times daily. (Patient taking differently: Take 20 mg by mouth daily. ) 60 tablet 3  . gabapentin (NEURONTIN) 300 MG capsule TAKE 2 CAPSULES (600 MG TOTAL) BY MOUTH 3 TIMES A DAY 180 capsule 3  . ibuprofen (ADVIL,MOTRIN) 200 MG tablet Take 600-800 mg by mouth every 8 (eight) hours as needed (for pain.).     . Insulin Pen Needle (BD PEN NEEDLE NANO U/F) 32G X 4 MM MISC 1 Package by Does not apply route daily at 12 noon. 100 each 0  . Liraglutide -Weight Management (SAXENDA) 18 MG/3ML SOPN Inject 3 mg into the skin daily. 5 pen 0  . meloxicam (MOBIC) 15 MG tablet Take 1 tablet (15 mg total) by mouth daily. 30 tablet 0  . polyethylene glycol powder (GLYCOLAX/MIRALAX) powder Take 17 g by mouth daily as needed. 3350 g 1  .  ranitidine (ZANTAC) 150 MG tablet Take 1 tablet (150 mg total) by mouth 2 (two) times daily. 60 tablet 0   No current facility-administered medications on file prior to visit.     PAST MEDICAL HISTORY: Past Medical History:  Diagnosis Date  . Allergy   . Anxiety   . Arthritis   . Asthma    since baby, seasonal  . ASTHMA, UNSPECIFIED 12/10/2006   Qualifier: Diagnosis of  By: Abundio MiuMcGregor, Barbara    . CELLULITIS AND ABSCESS OF LEG EXCEPT FOOT 12/30/2007   Qualifier: Diagnosis of  By: Daphine DeutscherMartin FNP, Zena AmosNykedtra    . Cyst of bone    right side of spine  . DEPENDENT  EDEMA, LEGS, BILATERAL 11/22/2008   Qualifier: Diagnosis of  By: Barbaraann Barthelankins MD, [place]    . Depression 2003Dx  . Edema   . Food allergy    citris  . FOOT PAIN, RIGHT 11/22/2008   Qualifier: Diagnosis of  By: Barbaraann Barthelankins MD, [place]    . Gastric ulcer   . GERD (gastroesophageal reflux disease)   . Gout   . Hypertension    took Norvasc for 3 months, doctor discontinued no longer on BP medications  . Joint pain   . KNEE INJURY, LEFT 01/01/2009   Qualifier: Diagnosis of  By: Delrae AlfredMulberry MD, Lanora ManisElizabeth    . Migraine   . ONYCHOMYCOSIS, TOENAILS 11/22/2008   Qualifier: Diagnosis of  By: Barbaraann Barthelankins MD, [place]    . Shortness of breath   . Sleep apnea   . TINEA PEDIS 11/22/2008   Qualifier: Diagnosis of  By: Barbaraann Barthelankins MD, Benetta SparVictoria      PAST SURGICAL HISTORY: Past Surgical History:  Procedure Laterality Date  . DILITATION & CURRETTAGE/HYSTROSCOPY WITH NOVASURE ABLATION N/A 07/03/2017   Procedure: DILATATION & CURETTAGE/HYSTEROSCOPY WITH NOVASURE ABLATION;  Surgeon: Genia DelLavoie, Marie-Lyne, MD;  Location: [facility];  Service: Gynecology;  Laterality: N/A;  . UPPER GI ENDOSCOPY    . WISDOM TOOTH EXTRACTION  2008   x4    SOCIAL HISTORY: Social History   Tobacco Use  . Smoking status: Former Smoker    Packs/day: 0.50    Years: 18.00    Pack years: 9.00    Types: Cigarettes    Start date: 10/13/1996    Last attempt to quit: 01/12/2015    Years since quitting: 3.3  . Smokeless tobacco: Never Used  Substance Use Topics  . Alcohol use: Yes    Alcohol/week: 1.0 standard drinks    Types: 1 Standard drinks or equivalent per week    Comment: rarely drinks  . Drug use: No    Comment: marjuana - former use, quit 4 years ago    FAMILY HISTORY: Family History  Problem Relation Age of Onset  . Hypertension Mother   . Hyperlipidemia Mother   . Cancer Mother        bone cancer  . Heart disease Mother   . Sudden death Mother   . Stroke Mother   . Thyroid disease Mother   . Sleep apnea Mother   . Obesity  Mother   . Sudden death Father   . Hypertension Father   . Heart disease Father   . Hyperlipidemia Father   . Prostate cancer Father   . Lung cancer Father   . Obesity Father   . Alcoholism Father   . Sudden death Brother   . Hypertension Brother   . Hyperlipidemia Brother   . Heart attack Brother   . Heart disease Brother   . Stroke  Brother   . Alcoholism Brother   . Drug abuse Brother   . Heart disease Sister   . Hypertension Sister   . Thyroid cancer Sister   . Diabetes Neg Hx   . Colon cancer Neg Hx   . Stomach cancer Neg Hx   . Esophageal cancer Neg Hx   . Rectal cancer Neg Hx   . Liver cancer Neg Hx     ROS: Review of Systems  Constitutional: Positive for weight loss.  Cardiovascular: Negative for chest pain and claudication.  Gastrointestinal: Negative for nausea and vomiting.  Genitourinary:       Negative for polyphagia.  Musculoskeletal: Negative for myalgias.       Negative for muscle weakness.  Endo/Heme/Allergies:       Negative got hypoglycemia.    PHYSICAL EXAM: Blood pressure 107/73, pulse 96, temperature 98.4 F (36.9 C), temperature source Oral, height 5\' 7"  (1.702 m), weight (!) 420 lb (190.5 kg), SpO2 98 %. Body mass index is 65.78 kg/m. Physical Exam  Constitutional: She is oriented to person, place, and time. She appears well-developed and well-nourished.  Cardiovascular: Normal rate.  Pulmonary/Chest: Effort normal.  Musculoskeletal: Normal range of motion.  Neurological: She is oriented to person, place, and time.  Skin: Skin is warm and dry.  Psychiatric: She has a normal mood and affect. Her behavior is normal.  Vitals reviewed.   RECENT LABS AND TESTS: BMET    Component Value Date/Time   NA 140 12/10/2017 1242   K 3.7 12/10/2017 1242   CL 105 12/10/2017 1242   CO2 19 (L) 12/10/2017 1242   GLUCOSE 83 12/10/2017 1242   GLUCOSE 83 06/04/2016 1155   BUN 11 12/10/2017 1242   CREATININE 0.81 12/10/2017 1242   CREATININE 0.90  06/04/2016 1155   CALCIUM 9.0 12/10/2017 1242   GFRNONAA 94 12/10/2017 1242   GFRNONAA 84 06/04/2016 1155   GFRAA 108 12/10/2017 1242   GFRAA >89 06/04/2016 1155   Lab Results  Component Value Date   HGBA1C 5.3 12/10/2017   HGBA1C 5.4 06/25/2017   HGBA1C 5.2 05/19/2017   HGBA1C 5.4 01/20/2017   HGBA1C 5.6 10/27/2016   Lab Results  Component Value Date   INSULIN 43.6 (H) 12/10/2017   INSULIN 30.4 (H) 06/25/2017   INSULIN 29.7 (H) 01/20/2017   INSULIN 30.1 (H) 10/27/2016   CBC    Component Value Date/Time   WBC 10.6 12/10/2017 1242   WBC 13.3 (H) 07/03/2017 0610   RBC 4.64 12/10/2017 1242   RBC 4.33 07/03/2017 0610   HGB 12.5 12/10/2017 1242   HCT 38.4 12/10/2017 1242   PLT 293 07/03/2017 0610   PLT 343 05/18/2017 1611   MCV 83 12/10/2017 1242   MCH 26.9 12/10/2017 1242   MCH 25.9 (L) 07/03/2017 0610   MCHC 32.6 12/10/2017 1242   MCHC 32.4 07/03/2017 0610   RDW 16.2 (H) 12/10/2017 1242   LYMPHSABS 5.2 (H) 12/10/2017 1242   MONOABS 0.6 07/03/2017 0610   EOSABS 0.0 12/10/2017 1242   BASOSABS 0.0 12/10/2017 1242   Iron/TIBC/Ferritin/ %Sat    Component Value Date/Time   IRON 25 (L) 05/18/2017 1611   TIBC 363 05/18/2017 1611   FERRITIN 16 05/18/2017 1611   IRONPCTSAT 7 (LL) 05/18/2017 1611   Lipid Panel     Component Value Date/Time   CHOL 152 10/27/2016 1058   TRIG 57 10/27/2016 1058   HDL 43 10/27/2016 1058   CHOLHDL 3.6 11/21/2015 1047   VLDL 10 11/21/2015 1047  LDLCALC 98 10/27/2016 1058   Hepatic Function Panel     Component Value Date/Time   PROT 7.2 12/10/2017 1242   ALBUMIN 4.2 12/10/2017 1242   AST 22 12/10/2017 1242   ALT 27 12/10/2017 1242   ALKPHOS 62 12/10/2017 1242   BILITOT 0.5 12/10/2017 1242      Component Value Date/Time   TSH 1.43 05/19/2017 1245   TSH 1.460 10/27/2016 1058   TSH 0.96 11/21/2015 1047   Results for SHAKALA, MARLATT (MRN [id number]) as of 05/24/2018 12:18  Ref. Range 12/10/2017 12:42  Vitamin D, 25-Hydroxy  Latest Ref Range: 30.0 - 100.0 ng/mL 28.5 (L)    ASSESSMENT AND PLAN: Vitamin D deficiency - Plan: VITAMIN D 25 Hydroxy (Vit-D Deficiency, Fractures), Vitamin D, Ergocalciferol, (DRISDOL) 50000 units CAPS capsule  Insulin resistance - Plan: Comprehensive metabolic panel, Hemoglobin A1c, Insulin, random  Other hyperlipidemia - Plan: Lipid Panel With LDL/HDL Ratio  At risk for osteoporosis  Class 3 severe obesity with serious comorbidity and body mass index (BMI) of 60.0 to 69.9 in adult, unspecified obesity type (HCC)  PLAN:  Vitamin D Deficiency Catherine [name] was informed that low vitamin D levels contributes to fatigue and are associated with obesity, breast, and colon cancer. She agrees to continue to take prescription Vit D @50 ,000 IU every week #4 with no refills we will check labs today. She agrees to follow up as directed. She will follow up for routine testing of vitamin D, at least 2-3 times per year. She was informed of the risk of over-replacement of vitamin D and agrees to not increase her dose unless she discusses this with [name] first.  At risk for osteopenia and osteoporosis Catherine [name] is at risk for osteopenia and osteoporosis due to her vitamin D deficiency. She was encouraged to take her vitamin D and follow her higher calcium diet and increase strengthening exercise to help strengthen her bones and decrease her risk of osteopenia and osteoporosis.  Insulin Resistance Catherine [name] will continue to work on weight loss, exercise, and decreasing simple carbohydrates in her diet to help decrease the risk of diabetes. We dicussed metformin including benefits and risks. She was informed that eating too many simple carbohydrates or too many calories at one sitting increases the likelihood of GI side effects. Catherine [name] declined metformin for now and prescription was not written today. We will check labs today.  Catherine [name] agreed to follow up with [name] as directed to monitor her  progress.  Hyperlipidemia Catherine [name] was informed of the American Heart Association Guidelines emphasizing intensive lifestyle modifications as the first line treatment for hyperlipidemia. We discussed many lifestyle modifications today in depth, and Catherine [name] will continue to work on decreasing saturated fats such as fatty red meat, butter and many fried foods. She will also increase vegetables and lean protein in her diet and continue to work on exercise and weight loss efforts. We will check labs today.  Obesity Catherine [name] is currently in the action stage of change. As such, her goal is to continue with weight loss efforts. She has agreed to follow the Category 3 plan and additional breakfast ideas were given. Catherine [name] has been instructed to work up to a goal of 150 minutes of combined cardio and strengthening exercise per week for weight loss and overall health benefits. We discussed the following Behavioral Modification Strategies today: work on meal planning and easy cooking plans and planning for success. We discussed various medication options to help Catherine [name] with her weight loss efforts and we both agreed to  continue Saxenda 3 mg daily.   Shakeia has agreed to follow up with our clinic in 2 weeks. She was informed of the importance of frequent follow up visits to maximize her success with intensive lifestyle modifications for her multiple health conditions.   Catherine [name] Flight, am acting as transcriptionist for Alois Cliche, PA-C I, Alois Cliche, PA-C have reviewed above note and agree with its content

## 2018-05-25 LAB — COMPREHENSIVE METABOLIC PANEL
ALT: 24 IU/L (ref 0–32)
AST: 29 IU/L (ref 0–40)
Albumin/Globulin Ratio: 1.3 (ref 1.2–2.2)
Albumin: 4.1 g/dL (ref 3.5–5.5)
Alkaline Phosphatase: 70 IU/L (ref 39–117)
BUN/Creatinine Ratio: 19 (ref 9–23)
BUN: 17 mg/dL (ref 6–20)
Bilirubin Total: 0.3 mg/dL (ref 0.0–1.2)
CO2: 15 mmol/L — ABNORMAL LOW (ref 20–29)
Calcium: 9 mg/dL (ref 8.7–10.2)
Chloride: 109 mmol/L — ABNORMAL HIGH (ref 96–106)
Creatinine, Ser: 0.88 mg/dL (ref 0.57–1.00)
GFR calc Af Amer: 98 mL/min/{1.73_m2} (ref >59)
GFR calc non Af Amer: 85 mL/min/{1.73_m2} (ref >59)
Globulin, Total: 3.2 g/dL (ref 1.5–4.5)
Glucose: 79 mg/dL (ref 65–99)
Potassium: 4.3 mmol/L (ref 3.5–5.2)
Sodium: 142 mmol/L (ref 134–144)
Total Protein: 7.3 g/dL (ref 6.0–8.5)

## 2018-05-25 LAB — LIPID PANEL WITH LDL/HDL RATIO
Cholesterol, Total: 146 mg/dL (ref 100–199)
HDL: 41 mg/dL (ref >39)
LDL Calculated: 94 mg/dL (ref 0–99)
LDl/HDL Ratio: 2.3 ratio (ref 0.0–3.2)
Triglycerides: 53 mg/dL (ref 0–149)
VLDL Cholesterol Cal: 11 mg/dL (ref 5–40)

## 2018-05-25 LAB — HEMOGLOBIN A1C
Est. average glucose Bld gHb Est-mCnc: 108 mg/dL
Hgb A1c MFr Bld: 5.4 % (ref 4.8–5.6)

## 2018-05-25 LAB — VITAMIN D 25 HYDROXY (VIT D DEFICIENCY, FRACTURES): Vit D, 25-Hydroxy: 39.4 ng/mL (ref 30.0–100.0)

## 2018-05-25 LAB — INSULIN, RANDOM: INSULIN: 28.3 u[IU]/mL — ABNORMAL HIGH (ref 2.6–24.9)

## 2018-05-26 ENCOUNTER — Ambulatory Visit: Payer: 59 | Attending: Sports Medicine | Admitting: Physical Therapy

## 2018-05-26 ENCOUNTER — Encounter: Payer: Self-pay | Admitting: Physical Therapy

## 2018-05-26 DIAGNOSIS — M25562 Pain in left knee: Secondary | ICD-10-CM | POA: Diagnosis present

## 2018-05-26 DIAGNOSIS — R262 Difficulty in walking, not elsewhere classified: Secondary | ICD-10-CM | POA: Insufficient documentation

## 2018-05-26 DIAGNOSIS — M25662 Stiffness of left knee, not elsewhere classified: Secondary | ICD-10-CM | POA: Insufficient documentation

## 2018-05-26 DIAGNOSIS — G8929 Other chronic pain: Secondary | ICD-10-CM | POA: Diagnosis present

## 2018-05-26 DIAGNOSIS — M25661 Stiffness of right knee, not elsewhere classified: Secondary | ICD-10-CM | POA: Diagnosis present

## 2018-05-26 DIAGNOSIS — M25561 Pain in right knee: Secondary | ICD-10-CM | POA: Insufficient documentation

## 2018-05-26 NOTE — Therapy (Signed)
[facility] [address] [place], [place], [address] Phone: [phone]   Fax:  [phone]  Physical Therapy Treatment  Patient Details  Name: Catherine [name] MRN: [id number] Date of Birth: [birth year] Referring Provider: Dr Reino Bellisimothy Draper    Encounter Date: 05/26/2018  PT End of Session - 05/26/18 0850    Visit Number  8    Number of Visits  16    PT Start Time  0805    PT Stop Time  0855    PT Time Calculation (min)  50 min    Activity Tolerance  Patient tolerated treatment well    Behavior During Therapy  [facility] for tasks assessed/performed       Past Medical History:  Diagnosis Date  . Allergy   . Anxiety   . Arthritis   . Asthma    since baby, seasonal  . ASTHMA, UNSPECIFIED 12/10/2006   Qualifier: Diagnosis of  By: Abundio MiuMcGregor, Barbara    . CELLULITIS AND ABSCESS OF LEG EXCEPT FOOT 12/30/2007   Qualifier: Diagnosis of  By: Daphine DeutscherMartin FNP, Zena AmosNykedtra    . Cyst of bone    right side of spine  . DEPENDENT EDEMA, LEGS, BILATERAL 11/22/2008   Qualifier: Diagnosis of  By: Barbaraann Barthelankins MD, [place]    . Depression 2003Dx  . Edema   . Food allergy    citris  . FOOT PAIN, RIGHT 11/22/2008   Qualifier: Diagnosis of  By: Barbaraann Barthelankins MD, [place]    . Gastric ulcer   . GERD (gastroesophageal reflux disease)   . Gout   . Hypertension    took Norvasc for 3 months, doctor discontinued no longer on BP medications  . Joint pain   . KNEE INJURY, LEFT 01/01/2009   Qualifier: Diagnosis of  By: Delrae AlfredMulberry MD, Lanora ManisElizabeth    . Migraine   . ONYCHOMYCOSIS, TOENAILS 11/22/2008   Qualifier: Diagnosis of  By: Barbaraann Barthelankins MD, [place]    . Shortness of breath   . Sleep apnea   . TINEA PEDIS 11/22/2008   Qualifier: Diagnosis of  By: Barbaraann Barthelankins MD, [place]      Past Surgical History:  Procedure Laterality Date  . DILITATION & CURRETTAGE/HYSTROSCOPY WITH NOVASURE ABLATION N/A 07/03/2017   Procedure: DILATATION & CURETTAGE/HYSTEROSCOPY WITH NOVASURE ABLATION;   Surgeon: Genia DelLavoie, Marie-Lyne, MD;  Location: [facility];  Service: Gynecology;  Laterality: N/A;  . UPPER GI ENDOSCOPY    . WISDOM TOOTH EXTRACTION  2008   x4    There were no vitals filed for this visit.  Subjective Assessment - 05/26/18 0808    Subjective  Pain is constant.  Better with stretches.      Currently in Pain?  Yes    Pain Score  4     Pain Location  Knee    Pain Orientation  Right;Anterior;Posterior    Pain Descriptors / Indicators  --   tooth ach   Pain Radiating Towards  shin     Pain Frequency  Constant    Aggravating Factors   sitting too long,  weightbearing    Pain Relieving Factors  stretches,  prop up change of position    Effect of Pain on Daily Activities  --   left knee hurts with rain a little   Multiple Pain Sites  --   right hip hurts with weather                      [facility] Adult PT Treatment/Exercise - 05/26/18 0001  Self-Care   Self-Care  Other Self-Care Comments   Use cane for safety uneven surfaces and to manage pain   Other Self-Care Comments   car, bed transfers,  do's don'ts with knee arthritis.      Knee/Hip Exercises: Stretches   Gastroc Stretch  3 reps;30 seconds   incline,  feels good     Knee/Hip Exercises: Seated   Hamstring Curl  2 sets;10 reps    Hamstring Limitations  right green band left black band    Sit to [employer]  10 reps   cues  higher seat     Knee/Hip Exercises: Supine   Quad Sets Limitations  cued technique   fatigues quickly     Knee/Hip Exercises: Sidelying   Hip ABduction  --   cued neutral hip     Knee/Hip Exercises: Prone   Hip Extension  --   needs to rotate hips to clear mat     Modalities   Modalities  Moist Heat      Moist Heat Therapy   Number Minutes Moist Heat  15 Minutes    Moist Heat Location  Knee   posterior both            PT Education - 05/26/18 0849    Education Details  Self care,  HEP    Person(s) Educated  Patient    Methods  Explanation;Demonstration;Verbal  cues;Handout    Comprehension  Verbalized understanding;Returned demonstration       PT Short Term Goals - 05/26/18 2130      PT SHORT TERM GOAL #1   Title  Patient will demostrate full active bilateral knee extension     Baseline  full AROm extension bilateral    Time  4    Period  Weeks    Status  Achieved      PT SHORT TERM GOAL #2   Title  Patient will increase bilateral lower extremity strnegth to 5/5     Baseline  Lt knee 5/5  RT hip add 3+/5      PT SHORT TERM GOAL #3   Title  Patient will be independent with taping if indicated     Baseline  patella deviates medially ,  less popping may not need taping.    Time  4    Period  Weeks    Status  On-going      PT SHORT TERM GOAL #4   Title  Patient will be independent with basic HEP     Baseline  indrependent    Time  4    Period  Weeks    Status  Achieved        PT Long Term Goals - 05/26/18 8657      PT LONG TERM GOAL #1   Title  Patient will go up and down 13 steps without popping or pain in bilateral knees in order to get in and out of her appartment.     Baseline  pops left knee and pain 2/10 right knee descending    Time  8    Period  Weeks    Status  On-going      PT LONG TERM GOAL #2   Title  Patient will be independent with long term strengthening program in order to protect her knee long term     Baseline  consistant so far  has started exercises with band from previous PT    Time  8    Period  Weeks  Status  On-going      PT LONG TERM GOAL #3   Title  Patient will demostrate a 41% limitation on FOTO     Baseline  57% limitation    Time  8    Period  Weeks    Status  On-going            Plan - 05/26/18 0850    Clinical Impression Statement  FOTO improved 1 point to 57% limitation.  LE strength 4+ to 5/5 except for adductors.  HEP progressed without increased pain.  Left knee improved since intake right knee most painful now. Goals assessed see flow sheet.     PT Next Visit Plan   Renewal vs D/C per patient.  Maximize function,  work on balance    PT Home Exercise Plan  knee extension strtch; gastroc stretch; patellar mobilization, bridges, SLR, standing calf stretch, hip abduction     Consulted and Agree with Plan of Care  Patient       Patient will benefit from skilled therapeutic intervention in order to improve the following deficits and impairments:     Visit Diagnosis: Chronic pain of right knee  Chronic pain of left knee  Stiffness of right knee, not elsewhere classified  Stiffness of left knee, not elsewhere classified  Difficulty in walking, not elsewhere classified     Problem List Patient Active Problem List   Diagnosis Date Noted  . Class 3 obesity with serious comorbidity and body mass index (BMI) of 60.0 to 69.9 in adult 06/10/2017  . Fluid retention 06/10/2017  . Menorrhagia 05/18/2017  . Sacral radiculopathy 02/04/2017  . Insulin resistance 02/02/2017  . At risk for violence to self related to depression 02/02/2017  . Morbid obesity (HCC) 01/01/2017  . Sore throat (viral) 11/21/2016  . Vitamin D deficiency 11/12/2016  . Prediabetes 11/12/2016  . Asthma 07/25/2016  . Abdominal pain 06/04/2016  . Otitis, externa, infective 06/04/2016  . Dysuria 03/20/2016  . Gout 11/21/2015  . Obesity, morbid, BMI 50 or higher (HCC) 05/14/2015  . BIPOLAR DISORDER UNSPECIFIED 11/22/2008  . Depression 12/10/2006  . MIGRAINE, UNSPEC., W/O INTRACTABLE MIGRAINE 12/10/2006  . GASTROESOPHAGEAL REFLUX, NO ESOPHAGITIS 12/10/2006  . OSA (obstructive sleep apnea) 12/10/2006    Swayzee Wadley  PTA 05/26/2018, 9:03 AM  [facility] [address] [place], [place], [address] Phone: [phone]   Fax:  [phone]  Name: Catherine [name] MRN: [id number] Date of Birth: May 16, 1981

## 2018-05-26 NOTE — Patient Instructions (Signed)
FUNCTIONAL MOBILITY: Squat    Stance: shoulder-width on floor. Bend hips and knees. Keep back straight. Do not allow knees to bend past toes. Squeeze glutes and quads to stand. _10__ reps per set, 1___ sets per day, __3-4_ days per week   Mat start with couch arm height Copyright  [employer]. All rights reserved.

## 2018-05-27 ENCOUNTER — Encounter: Payer: Self-pay | Admitting: Physical Therapy

## 2018-05-27 ENCOUNTER — Ambulatory Visit: Payer: 59 | Admitting: Physical Therapy

## 2018-05-27 DIAGNOSIS — M25561 Pain in right knee: Principal | ICD-10-CM

## 2018-05-27 DIAGNOSIS — M25661 Stiffness of right knee, not elsewhere classified: Secondary | ICD-10-CM

## 2018-05-27 DIAGNOSIS — R262 Difficulty in walking, not elsewhere classified: Secondary | ICD-10-CM | POA: Diagnosis not present

## 2018-05-27 DIAGNOSIS — G8929 Other chronic pain: Secondary | ICD-10-CM | POA: Diagnosis not present

## 2018-05-27 DIAGNOSIS — M25562 Pain in left knee: Secondary | ICD-10-CM

## 2018-05-27 DIAGNOSIS — M25662 Stiffness of left knee, not elsewhere classified: Secondary | ICD-10-CM

## 2018-05-27 NOTE — Therapy (Signed)
[facility] [address] [place], [place], [address] Phone: [phone]   Fax:  [phone]  Physical Therapy Treatment  Patient Details  Name: Catherine [name] Vicente MRN: [id number] Date of Birth: 02/28/1981 Referring Provider: Dr Reino Bellisimothy Draper    Encounter Date: 05/27/2018  PT End of Session - 05/27/18 0801    Visit Number  9    Number of Visits  16    Date for PT Re-Evaluation  06/24/18    Authorization Type  [facility]     PT Start Time  0801    PT Stop Time  0853    PT Time Calculation (min)  52 min    Activity Tolerance  Patient tolerated treatment well    Behavior During Therapy  [facility] for tasks assessed/performed       Past Medical History:  Diagnosis Date  . Allergy   . Anxiety   . Arthritis   . Asthma    since baby, seasonal  . ASTHMA, UNSPECIFIED 12/10/2006   Qualifier: Diagnosis of  By: Abundio MiuMcGregor, Barbara    . CELLULITIS AND ABSCESS OF LEG EXCEPT FOOT 12/30/2007   Qualifier: Diagnosis of  By: Daphine DeutscherMartin FNP, Zena AmosNykedtra    . Cyst of bone    right side of spine  . DEPENDENT EDEMA, LEGS, BILATERAL 11/22/2008   Qualifier: Diagnosis of  By: Barbaraann Barthelankins MD, [place]    . Depression 2003Dx  . Edema   . Food allergy    citris  . FOOT PAIN, RIGHT 11/22/2008   Qualifier: Diagnosis of  By: Barbaraann Barthelankins MD, [place]    . Gastric ulcer   . GERD (gastroesophageal reflux disease)   . Gout   . Hypertension    took Norvasc for 3 months, doctor discontinued no longer on BP medications  . Joint pain   . KNEE INJURY, LEFT 01/01/2009   Qualifier: Diagnosis of  By: Delrae AlfredMulberry MD, Lanora ManisElizabeth    . Migraine   . ONYCHOMYCOSIS, TOENAILS 11/22/2008   Qualifier: Diagnosis of  By: Barbaraann Barthelankins MD, [place]    . Shortness of breath   . Sleep apnea   . TINEA PEDIS 11/22/2008   Qualifier: Diagnosis of  By: Barbaraann Barthelankins MD, [place]      Past Surgical History:  Procedure Laterality Date  . DILITATION & CURRETTAGE/HYSTROSCOPY WITH NOVASURE ABLATION [name]/A 07/03/2017    Procedure: DILATATION & CURETTAGE/HYSTEROSCOPY WITH NOVASURE ABLATION;  Surgeon: Genia DelLavoie, Marie-Lyne, MD;  Location: [facility];  Service: Gynecology;  Laterality: [name]/A;  . UPPER GI ENDOSCOPY    . WISDOM TOOTH EXTRACTION  2008   x4    There were no vitals filed for this visit.  Subjective Assessment - 05/27/18 0807    Subjective  Patient reports her calf stretch has helped her pain. She continues to have pain into her anterior tib area and increased pain when she walks.     Limitations  Standing;Walking    How long can you sit comfortably?  Stiffnes when she sits    How long can you stand comfortably?  45 minutes    How long can you walk comfortably?  Can walk around using the shopping cart for assistance     Diagnostic tests  X-ray right: advanced tri-compartmental arthritis     Patient Stated Goals  to have less pain     Currently in Pain?  Yes    Pain Score  4     Pain Location  Knee    Pain Orientation  Right;Posterior;Anterior    Pain Descriptors /  Indicators  Aching    Pain Type  Chronic pain    Pain Onset  More than a month ago    Pain Frequency  Constant    Aggravating Factors   sitting too long and weight bearing     Pain Relieving Factors  stretches, prop up andn chanbge position                        [facility] Adult PT Treatment/Exercise - 05/27/18 0001      Self-Care   Self-Care  Other Self-Care Comments   Use cane for safety uneven surfaces and to manage pain   Other Self-Care Comments   car, bed transfers,  do's don'ts with knee arthritis.      Knee/Hip Exercises: Stretches   Active Hamstring Stretch  Both;2 reps;30 seconds    Gastroc Stretch  3 reps;30 seconds   incline,  feels good     Knee/Hip Exercises: Aerobic   Nustep  5 min       Knee/Hip Exercises: Standing   Heel Raises  Both;1 set;15 reps   weight shift      Knee/Hip Exercises: Seated   Long Arc Quad  20 reps;Right    Hamstring Curl  2 sets;10 reps    Hamstring Limitations  right green  band left black band    Sit to [employer]  --   cues  higher seat     Knee/Hip Exercises: Sidelying   Hip ABduction  --   cued neutral hip     Knee/Hip Exercises: Prone   Hip Extension  --   needs to rotate hips to clear mat     Modalities   Modalities  Moist Heat      Moist Heat Therapy   Number Minutes Moist Heat  10 Minutes    Moist Heat Location  Knee   posterior both     Manual Therapy   Manual Therapy  Taping    Kinesiotex  Inhibit Muscle;Create Space      Kinesiotix   Inhibit Muscle   anterior tib taping             PT Education - 05/27/18 1640    Education Details  Self Care, HEP     Person(s) Educated  Patient    Methods  Explanation;Demonstration;Tactile cues;Verbal cues;Handout    Comprehension  Verbalized understanding;Returned demonstration;Verbal cues required;Tactile cues required       PT Short Term Goals - 05/26/18 1610      PT SHORT TERM GOAL #1   Title  Patient will demostrate full active bilateral knee extension     Baseline  full AROm extension bilateral    Time  4    Period  Weeks    Status  Achieved      PT SHORT TERM GOAL #2   Title  Patient will increase bilateral lower extremity strnegth to 5/5     Baseline  Lt knee 5/5  RT hip add 3+/5      PT SHORT TERM GOAL #3   Title  Patient will be independent with taping if indicated     Baseline  patella deviates medially ,  less popping may not need taping.    Time  4    Period  Weeks    Status  On-going      PT SHORT TERM GOAL #4   Title  Patient will be independent with basic HEP     Baseline  indrependent  Time  4    Period  Weeks    Status  Achieved        PT Long Term Goals - 05/26/18 4098      PT LONG TERM GOAL #1   Title  Patient will go up and down 13 steps without popping or pain in bilateral knees in order to get in and out of her appartment.     Baseline  pops left knee and pain 2/10 right knee descending    Time  8    Period  Weeks    Status  On-going      PT  LONG TERM GOAL #2   Title  Patient will be independent with long term strengthening program in order to protect her knee long term     Baseline  consistant so far  has started exercises with band from previous PT    Time  8    Period  Weeks    Status  On-going      PT LONG TERM GOAL #3   Title  Patient will demostrate a 41% limitation on FOTO     Baseline  57% limitation    Time  8    Period  Weeks    Status  On-going            Plan - 05/27/18 1640    Clinical Impression Statement  Patient was found to have a large trigger point in her anterior tib. Therapy attempted taping for shin splint type pain. We will see iff this helps her pain. Therapy had pateint perfrom simple activity today becuase she will be helping her daughter move in so she will be carrying objects. Overall she has had improved  she has improved left knee pain and less crepitus in her right knee. She has developed an anterir calf and posterior calf pain. This pain does not seem to be changing much. She has been consitetn with her exercises. She will be seen for 2 more weeks to see if treating her large trigger point in her anterior tib may be benificial. If her pain remains she will be discharged to HEP.,     History and Personal Factors relevant to plan of care:  morbid obesiy     Clinical Presentation  Evolving    Clinical Decision Making  Moderate    PT Frequency  2x / week    PT Duration  4 weeks    PT Treatment/Interventions  ADLs/Self Care Home Management;Cryotherapy;Electrical Stimulation;Iontophoresis 4mg /ml Dexamethasone;Ultrasound;Stair training;Gait training;Functional mobility training;Therapeutic activities;Therapeutic exercise;Patient/family education;Neuromuscular re-education;Manual techniques;Passive range of motion;Dry needling;Taping    PT Next Visit Plan  2 more weeks for taping and work on her anterior tib, continue with exercises. If pain dosent improve send back to MD.     PT Home Exercise Plan   knee extension strtch; gastroc stretch; patellar mobilization, bridges, SLR, standing calf stretch, hip abduction     Consulted and Agree with Plan of Care  Patient       Patient will benefit from skilled therapeutic intervention in order to improve the following deficits and impairments:  Abnormal gait  Visit Diagnosis: Chronic pain of right knee - Plan: PT plan of care cert/re-cert  Chronic pain of left knee - Plan: PT plan of care cert/re-cert  Stiffness of right knee, not elsewhere classified - Plan: PT plan of care cert/re-cert  Stiffness of left knee, not elsewhere classified - Plan: PT plan of care cert/re-cert  Difficulty in walking, not elsewhere classified -  Plan: PT plan of care cert/re-cert     Problem List Patient Active Problem List   Diagnosis Date Noted  . Class 3 obesity with serious comorbidity and body mass index (BMI) of 60.0 to 69.9 in adult 06/10/2017  . Fluid retention 06/10/2017  . Menorrhagia 05/18/2017  . Sacral radiculopathy 02/04/2017  . Insulin resistance 02/02/2017  . At risk for violence to self related to depression 02/02/2017  . Morbid obesity (HCC) 01/01/2017  . Sore throat (viral) 11/21/2016  . Vitamin D deficiency 11/12/2016  . Prediabetes 11/12/2016  . Asthma 07/25/2016  . Abdominal pain 06/04/2016  . Otitis, externa, infective 06/04/2016  . Dysuria 03/20/2016  . Gout 11/21/2015  . Obesity, morbid, BMI 50 or higher (HCC) 05/14/2015  . BIPOLAR DISORDER UNSPECIFIED 11/22/2008  . Depression 12/10/2006  . MIGRAINE, UNSPEC., W/O INTRACTABLE MIGRAINE 12/10/2006  . GASTROESOPHAGEAL REFLUX, NO ESOPHAGITIS 12/10/2006  . OSA (obstructive sleep apnea) 12/10/2006    Dessie Comaavid J Nyshaun Standage 05/27/2018, 4:55 PM  [facility] [address] [place], [place], [address] Phone: [phone]   Fax:  [phone]  Name: Catherine [name] Kates MRN: [id number] Date of Birth: 12-Jan-1981

## 2018-05-31 ENCOUNTER — Ambulatory Visit: Payer: 59 | Admitting: Physical Therapy

## 2018-05-31 ENCOUNTER — Encounter: Payer: Self-pay | Admitting: Physical Therapy

## 2018-05-31 DIAGNOSIS — R262 Difficulty in walking, not elsewhere classified: Secondary | ICD-10-CM | POA: Diagnosis not present

## 2018-05-31 DIAGNOSIS — G8929 Other chronic pain: Secondary | ICD-10-CM | POA: Diagnosis not present

## 2018-05-31 DIAGNOSIS — M25562 Pain in left knee: Secondary | ICD-10-CM | POA: Diagnosis not present

## 2018-05-31 DIAGNOSIS — M25662 Stiffness of left knee, not elsewhere classified: Secondary | ICD-10-CM | POA: Diagnosis not present

## 2018-05-31 DIAGNOSIS — M25661 Stiffness of right knee, not elsewhere classified: Secondary | ICD-10-CM | POA: Diagnosis not present

## 2018-05-31 DIAGNOSIS — M25561 Pain in right knee: Principal | ICD-10-CM

## 2018-05-31 NOTE — Therapy (Signed)
[facility] [address] [place], [place], [address] Phone: [phone]   Fax:  [phone]  Physical Therapy Treatment  Patient Details  Name: Catherine [name] MRN: [id number] Date of Birth: 1981/04/22 Referring Provider: Dr Reino Bellisimothy Draper    Encounter Date: 05/31/2018  PT End of Session - 05/31/18 1558    Visit Number  10    Number of Visits  16    Date for PT Re-Evaluation  06/24/18    PT Start Time  0855    PT Stop Time  0935    PT Time Calculation (min)  40 min    Activity Tolerance  Patient tolerated treatment well    Behavior During Therapy  [facility] for tasks assessed/performed       Past Medical History:  Diagnosis Date  . Allergy   . Anxiety   . Arthritis   . Asthma    since baby, seasonal  . ASTHMA, UNSPECIFIED 12/10/2006   Qualifier: Diagnosis of  By: Abundio MiuMcGregor, Barbara    . CELLULITIS AND ABSCESS OF LEG EXCEPT FOOT 12/30/2007   Qualifier: Diagnosis of  By: Daphine DeutscherMartin FNP, Zena AmosNykedtra    . Cyst of bone    right side of spine  . DEPENDENT EDEMA, LEGS, BILATERAL 11/22/2008   Qualifier: Diagnosis of  By: Barbaraann Barthelankins MD, [place]    . Depression 2003Dx  . Edema   . Food allergy    citris  . FOOT PAIN, RIGHT 11/22/2008   Qualifier: Diagnosis of  By: Barbaraann Barthelankins MD, [place]    . Gastric ulcer   . GERD (gastroesophageal reflux disease)   . Gout   . Hypertension    took Norvasc for 3 months, doctor discontinued no longer on BP medications  . Joint pain   . KNEE INJURY, LEFT 01/01/2009   Qualifier: Diagnosis of  By: Delrae AlfredMulberry MD, Lanora ManisElizabeth    . Migraine   . ONYCHOMYCOSIS, TOENAILS 11/22/2008   Qualifier: Diagnosis of  By: Barbaraann Barthelankins MD, [place]    . Shortness of breath   . Sleep apnea   . TINEA PEDIS 11/22/2008   Qualifier: Diagnosis of  By: Barbaraann Barthelankins MD, [place]      Past Surgical History:  Procedure Laterality Date  . DILITATION & CURRETTAGE/HYSTROSCOPY WITH NOVASURE ABLATION [name]/A 07/03/2017   Procedure: DILATATION &  CURETTAGE/HYSTEROSCOPY WITH NOVASURE ABLATION;  Surgeon: Genia DelLavoie, Marie-Lyne, MD;  Location: [facility];  Service: Gynecology;  Laterality: [name]/A;  . UPPER GI ENDOSCOPY    . WISDOM TOOTH EXTRACTION  2008   x4    There were no vitals filed for this visit.  Subjective Assessment - 05/31/18 1034    Subjective  Tape came off the next morning.   Pain continues.     Currently in Pain?  Yes    Pain Score  6     Pain Location  Knee   shin   Pain Orientation  Right;Anterior    Pain Descriptors / Indicators  Aching;Sore    Pain Radiating Towards  shin    Pain Frequency  Intermittent    Aggravating Factors   bending knee and flexing foot,  walking    Pain Relieving Factors  prop up,  wearing hightops ( wears slide ons to clinic)  compression socks.                       [facility]/Exercise - 05/31/18 0001      Self-Care   Other Self-Care Comments   shin splints  possible causes.  wear supportive shoes,  ice up to 3 x a day.  stretch calf.       Cryotherapy   Cryotherapy Location  --   anterior tibialis   Type of Cryotherapy  Ice massage      Manual Therapy   Manual Therapy  Taping    Manual therapy comments  Instrument assist soft tissue work,  trigger point release   PROM into PF,  toe stretch   Soft tissue mobilization  anterior tib    Kinesiotex  IT consultant  anterior tib   X pattern with 50 % stretch middle third. This will not help until compression socks are removed .  patient is aware   4 - 5 X's  overlapping            PT Education - 05/31/18 1556    Education Details  how to ice massage.  Possible causes of shin splints,  self care    Person(s) Educated  Patient    Methods  Explanation;Demonstration    Comprehension  Verbalized understanding       PT Short Term Goals - 05/26/18 1610      PT SHORT TERM GOAL #1   Title  Patient will demostrate full active bilateral knee extension     Baseline  full AROm  extension bilateral    Time  4    Period  Weeks    Status  Achieved      PT SHORT TERM GOAL #2   Title  Patient will increase bilateral lower extremity strnegth to 5/5     Baseline  Lt knee 5/5  RT hip add 3+/5      PT SHORT TERM GOAL #3   Title  Patient will be independent with taping if indicated     Baseline  patella deviates medially ,  less popping may not need taping.    Time  4    Period  Weeks    Status  On-going      PT SHORT TERM GOAL #4   Title  Patient will be independent with basic HEP     Baseline  indrependent    Time  4    Period  Weeks    Status  Achieved        PT Long Term Goals - 05/26/18 9604      PT LONG TERM GOAL #1   Title  Patient will go up and down 13 steps without popping or pain in bilateral knees in order to get in and out of her appartment.     Baseline  pops left knee and pain 2/10 right knee descending    Time  8    Period  Weeks    Status  On-going      PT LONG TERM GOAL #2   Title  Patient will be independent with long term strengthening program in order to protect her knee long term     Baseline  consistant so far  has started exercises with band from previous PT    Time  8    Period  Weeks    Status  On-going      PT LONG TERM GOAL #3   Title  Patient will demostrate a 41% limitation on FOTO     Baseline  57% limitation    Time  8    Period  Weeks    Status  On-going  Plan - 05/31/18 1558    Clinical Impression Statement  Patient responded well to ice massage and manual therapy today.  She was sore after moving daughter into dorm without an elevator.  Tape applied however it will not work under compression socks.  She will assess when sock removed.      PT Next Visit Plan  1.5 more weeks for taping and work on her anterior tib, continue with exercises. If pain dosent improve send back to MD.   Check goals.    PT Home Exercise Plan  knee extension strtch; gastroc stretch; patellar mobilization, bridges, SLR,  standing calf stretch, hip abduction     Consulted and Agree with Plan of Care  Patient       Patient will benefit from skilled therapeutic intervention in order to improve the following deficits and impairments:     Visit Diagnosis: Chronic pain of right knee  Chronic pain of left knee  Stiffness of right knee, not elsewhere classified  Stiffness of left knee, not elsewhere classified  Difficulty in walking, not elsewhere classified     Problem List Patient Active Problem List   Diagnosis Date Noted  . Class 3 obesity with serious comorbidity and body mass index (BMI) of 60.0 to 69.9 in adult 06/10/2017  . Fluid retention 06/10/2017  . Menorrhagia 05/18/2017  . Sacral radiculopathy 02/04/2017  . Insulin resistance 02/02/2017  . At risk for violence to self related to depression 02/02/2017  . Morbid obesity (HCC) 01/01/2017  . Sore throat (viral) 11/21/2016  . Vitamin D deficiency 11/12/2016  . Prediabetes 11/12/2016  . Asthma 07/25/2016  . Abdominal pain 06/04/2016  . Otitis, externa, infective 06/04/2016  . Dysuria 03/20/2016  . Gout 11/21/2015  . Obesity, morbid, BMI 50 or higher (HCC) 05/14/2015  . BIPOLAR DISORDER UNSPECIFIED 11/22/2008  . Depression 12/10/2006  . MIGRAINE, UNSPEC., W/O INTRACTABLE MIGRAINE 12/10/2006  . GASTROESOPHAGEAL REFLUX, NO ESOPHAGITIS 12/10/2006  . OSA (obstructive sleep apnea) 12/10/2006    Jiselle Sheu  PTA 05/31/2018, 4:03 PM  [facility] [address] [place], [place], [address] Phone: [phone]   Fax:  [phone]  Name: Catherine IlesJansala [name] Hannen MRN: [id number] Date of Birth: 1981-10-03

## 2018-05-31 NOTE — Patient Instructions (Signed)
Remove socks carefully to preserve tape.    Ice up to 3 X a day

## 2018-06-02 ENCOUNTER — Ambulatory Visit: Payer: 59 | Admitting: Physical Therapy

## 2018-06-02 ENCOUNTER — Encounter: Payer: Self-pay | Admitting: Physical Therapy

## 2018-06-02 DIAGNOSIS — M25661 Stiffness of right knee, not elsewhere classified: Secondary | ICD-10-CM | POA: Diagnosis not present

## 2018-06-02 DIAGNOSIS — M25561 Pain in right knee: Principal | ICD-10-CM

## 2018-06-02 DIAGNOSIS — M25662 Stiffness of left knee, not elsewhere classified: Secondary | ICD-10-CM | POA: Diagnosis not present

## 2018-06-02 DIAGNOSIS — G8929 Other chronic pain: Secondary | ICD-10-CM | POA: Diagnosis not present

## 2018-06-02 DIAGNOSIS — M25562 Pain in left knee: Secondary | ICD-10-CM

## 2018-06-02 DIAGNOSIS — R262 Difficulty in walking, not elsewhere classified: Secondary | ICD-10-CM | POA: Diagnosis not present

## 2018-06-02 NOTE — Therapy (Signed)
[facility] [address] [place], [place], [address] Phone: [phone]   Fax:  [phone]  Physical Therapy Treatment  Patient Details  Name: Catherine IlesJansala N [name] MRN: [id number] Date of Birth: 1981-08-18 Referring Provider: Dr Reino Bellisimothy Draper    Encounter Date: 06/02/2018  PT End of Session - 06/02/18 1609    Visit Number  11    Number of Visits  16    Authorization Type  [facility]     PT Start Time  0403    PT Stop Time  0443    PT Time Calculation (min)  40 min       Past Medical History:  Diagnosis Date  . Allergy   . Anxiety   . Arthritis   . Asthma    since baby, seasonal  . ASTHMA, UNSPECIFIED 12/10/2006   Qualifier: Diagnosis of  By: Abundio MiuMcGregor, Barbara    . CELLULITIS AND ABSCESS OF LEG EXCEPT FOOT 12/30/2007   Qualifier: Diagnosis of  By: Daphine DeutscherMartin FNP, Zena AmosNykedtra    . Cyst of bone    right side of spine  . DEPENDENT EDEMA, LEGS, BILATERAL 11/22/2008   Qualifier: Diagnosis of  By: Barbaraann Barthelankins MD, [place]    . Depression 2003Dx  . Edema   . Food allergy    citris  . FOOT PAIN, RIGHT 11/22/2008   Qualifier: Diagnosis of  By: Barbaraann Barthelankins MD, [place]    . Gastric ulcer   . GERD (gastroesophageal reflux disease)   . Gout   . Hypertension    took Norvasc for 3 months, doctor discontinued no longer on BP medications  . Joint pain   . KNEE INJURY, LEFT 01/01/2009   Qualifier: Diagnosis of  By: Delrae AlfredMulberry MD, Lanora ManisElizabeth    . Migraine   . ONYCHOMYCOSIS, TOENAILS 11/22/2008   Qualifier: Diagnosis of  By: Barbaraann Barthelankins MD, [place]    . Shortness of breath   . Sleep apnea   . TINEA PEDIS 11/22/2008   Qualifier: Diagnosis of  By: Barbaraann Barthelankins MD, [place]      Past Surgical History:  Procedure Laterality Date  . DILITATION & CURRETTAGE/HYSTROSCOPY WITH NOVASURE ABLATION N/A 07/03/2017   Procedure: DILATATION & CURETTAGE/HYSTEROSCOPY WITH NOVASURE ABLATION;  Surgeon: Genia DelLavoie, Marie-Lyne, MD;  Location: [facility];  Service: Gynecology;  Laterality:  N/A;  . UPPER GI ENDOSCOPY    . WISDOM TOOTH EXTRACTION  2008   x4    There were no vitals filed for this visit.  Subjective Assessment - 06/02/18 1606    Subjective  4/10 pain in right shin after work today. Tape helps a little.     Pain Score  4     Pain Location  Leg   shin and calf    Pain Orientation  Right;Lower;Anterior;Posterior                       [facility] Adult PT Treatment/Exercise - 06/02/18 0001      Knee/Hip Exercises: Seated   Other Seated Knee/Hip Exercises  seated ankle green band 4 way x 10 each, seated with foot on 4 inch step, progressive knee flexion while using strap to DF ankle,       Knee/Hip Exercises: Supine   Other Supine Knee/Hip Exercises  heel slides       Modalities   Modalities  Iontophoresis      Cryotherapy   Cryotherapy Location  --   anterior tibialis   Type of Cryotherapy  Ice massage  Iontophoresis   Type of Iontophoresis  Dexamethasone    Location  anterior tib    Dose  1ml    Time  6 hour patch             PT Education - 06/02/18 1649    Education Details  IONTOPHORESIS    Person(s) Educated  Patient    Methods  Explanation;Handout    Comprehension  Verbalized understanding       PT Short Term Goals - 05/26/18 0812      PT SHORT TERM GOAL #1   Title  Patient will demostrate full active bilateral knee extension     Baseline  full AROm extension bilateral    Time  4    Period  Weeks    Status  Achieved      PT SHORT TERM GOAL #2   Title  Patient will increase bilateral lower extremity strnegth to 5/5     Baseline  Lt knee 5/5  RT hip add 3+/5      PT SHORT TERM GOAL #3   Title  Patient will be independent with taping if indicated     Baseline  patella deviates medially ,  less popping may not need taping.    Time  4    Period  Weeks    Status  On-going      PT SHORT TERM GOAL #4   Title  Patient will be independent with basic HEP     Baseline  indrependent    Time  4    Period  Weeks     Status  Achieved        PT Long Term Goals - 05/26/18 [id number]      PT LONG TERM GOAL #1   Title  Patient will go up and down 13 steps without popping or pain in bilateral knees in order to get in and out of her appartment.     Baseline  pops left knee and pain 2/10 right knee descending    Time  8    Period  Weeks    Status  On-going      PT LONG TERM GOAL #2   Title  Patient will be independent with long term strengthening program in order to protect her knee long term     Baseline  consistant so far  has started exercises with band from previous PT    Time  8    Period  Weeks    Status  On-going      PT LONG TERM GOAL #3   Title  Patient will demostrate a 41% limitation on FOTO     Baseline  57% limitation    Time  8    Period  Weeks    Status  On-going            Plan - 06/02/18 1645    Clinical Impression Statement  She continues with pain in shin and calf. Tape seemed helpful this morning, Performed ankle strengthening and stretching as tolerated, open chain. Ice massage and ionto performed at end of session, Recommended frequent icing and avoid aggravating factors.     PT Next Visit Plan  1 more weeks for taping and work on her anterior tib, continue with exercises. If pain dosent improve send back to MD.   Check goals.    PT Home Exercise Plan  knee extension strtch; gastroc stretch; patellar mobilization, bridges, SLR, standing calf stretch, hip abduction     Consulted  and Agree with Plan of Care  Patient       Patient will benefit from skilled therapeutic intervention in order to improve the following deficits and impairments:  Abnormal gait  Visit Diagnosis: Chronic pain of right knee  Chronic pain of left knee  Stiffness of right knee, not elsewhere classified  Stiffness of left knee, not elsewhere classified     Problem List Patient Active Problem List   Diagnosis Date Noted  . Class 3 obesity with serious comorbidity and body mass index (BMI)  of 60.0 to 69.9 in adult 06/10/2017  . Fluid retention 06/10/2017  . Menorrhagia 05/18/2017  . Sacral radiculopathy 02/04/2017  . Insulin resistance 02/02/2017  . At risk for violence to self related to depression 02/02/2017  . Morbid obesity (HCC) 01/01/2017  . Sore throat (viral) 11/21/2016  . Vitamin D deficiency 11/12/2016  . Prediabetes 11/12/2016  . Asthma 07/25/2016  . Abdominal pain 06/04/2016  . Otitis, externa, infective 06/04/2016  . Dysuria 03/20/2016  . Gout 11/21/2015  . Obesity, morbid, BMI 50 or higher (HCC) 05/14/2015  . BIPOLAR DISORDER UNSPECIFIED 11/22/2008  . Depression 12/10/2006  . MIGRAINE, UNSPEC., W/O INTRACTABLE MIGRAINE 12/10/2006  . GASTROESOPHAGEAL REFLUX, NO ESOPHAGITIS 12/10/2006  . OSA (obstructive sleep apnea) 12/10/2006    Sherrie Mustache, PTA 06/02/2018, 4:49 PM  [facility] [address] [place], [place], [address] Phone: [phone]   Fax:  [phone]  Name: TOPANGA ALVELO MRN: [id number] Date of Birth: Mar 25, 1981

## 2018-06-02 NOTE — Patient Instructions (Signed)

## 2018-06-07 ENCOUNTER — Ambulatory Visit: Payer: 59 | Admitting: Physical Therapy

## 2018-06-07 ENCOUNTER — Encounter: Payer: Self-pay | Admitting: Physical Therapy

## 2018-06-07 ENCOUNTER — Ambulatory Visit (INDEPENDENT_AMBULATORY_CARE_PROVIDER_SITE_OTHER): Payer: 59 | Admitting: Physician Assistant

## 2018-06-07 VITALS — BP 126/84 | HR 97 | Temp 98.4°F | Ht 67.0 in | Wt >= 6400 oz

## 2018-06-07 DIAGNOSIS — R262 Difficulty in walking, not elsewhere classified: Secondary | ICD-10-CM | POA: Diagnosis not present

## 2018-06-07 DIAGNOSIS — Z6841 Body Mass Index (BMI) 40.0 and over, adult: Secondary | ICD-10-CM | POA: Diagnosis not present

## 2018-06-07 DIAGNOSIS — M25561 Pain in right knee: Secondary | ICD-10-CM | POA: Diagnosis not present

## 2018-06-07 DIAGNOSIS — F3289 Other specified depressive episodes: Secondary | ICD-10-CM

## 2018-06-07 DIAGNOSIS — M25661 Stiffness of right knee, not elsewhere classified: Secondary | ICD-10-CM

## 2018-06-07 DIAGNOSIS — M25662 Stiffness of left knee, not elsewhere classified: Secondary | ICD-10-CM

## 2018-06-07 DIAGNOSIS — M25562 Pain in left knee: Secondary | ICD-10-CM

## 2018-06-07 DIAGNOSIS — E559 Vitamin D deficiency, unspecified: Secondary | ICD-10-CM | POA: Diagnosis not present

## 2018-06-07 DIAGNOSIS — E66813 Obesity, class 3: Secondary | ICD-10-CM

## 2018-06-07 DIAGNOSIS — G8929 Other chronic pain: Secondary | ICD-10-CM

## 2018-06-07 DIAGNOSIS — Z9189 Other specified personal risk factors, not elsewhere classified: Secondary | ICD-10-CM | POA: Diagnosis not present

## 2018-06-07 MED ORDER — VITAMIN D (ERGOCALCIFEROL) 1.25 MG (50000 UNIT) PO CAPS: 50000.0000 [IU] | ORAL_CAPSULE | ORAL | 0 refills | Status: DC

## 2018-06-07 MED ORDER — LIRAGLUTIDE -WEIGHT MANAGEMENT 18 MG/3ML ~~LOC~~ SOPN: 3.0000 mg | PEN_INJECTOR | Freq: Every day | SUBCUTANEOUS | 0 refills | Status: DC

## 2018-06-07 MED ORDER — BUPROPION HCL ER (SR) 200 MG PO TB12: 200.0000 mg | ORAL_TABLET | Freq: Every day | ORAL | 0 refills | Status: DC

## 2018-06-07 MED FILL — FUROSEMIDE 20 MG TABS: 20 | 30 days supply | Qty: 60 | Fill #3

## 2018-06-07 MED FILL — VIT D2 1.25 MG (50,000 UNIT: 1.25 MG | 28 days supply | Qty: 4 | Fill #0

## 2018-06-07 MED FILL — BUPROPION HCL SR 200 MG TAB: 200 | 30 days supply | Qty: 30 | Fill #0

## 2018-06-07 MED FILL — SAXENDA 18 MG/3 ML PEN: 18 | 30 days supply | Qty: 15 | Fill #0

## 2018-06-07 NOTE — Progress Notes (Signed)
Office: [phone]  /  Fax: [phone]   HPI:   Chief Complaint: OBESITY Catherine [name] is here to discuss her progress with her obesity treatment plan. She is on the Category 3 plan and is following her eating plan approximately 85 % of the time. She states she is exercising 0 minutes 0 times per week. Catherine [name] reports following the plan closely. She states that she has been without her Lasix and is retaining some fluid today. She enjoys Category 3 and would like to stay on it.  Her weight is (!) 424 lb (192.3 kg) today and has gained 4 pounds since her last visit. She has lost 44 lbs since starting treatment with [name].  Vitamin D Deficiency Catherine [name] has a diagnosis of vitamin D deficiency. She is on prescription Vit D, and last level not at goal. She denies nausea, vomiting or muscle weakness.  At risk for osteopenia and osteoporosis Catherine [name] is at higher risk of osteopenia and osteoporosis due to vitamin D deficiency.   Depression with emotional eating behaviors Catherine [name]'s blood pressure is normal. She reports that this medication helps control cravings. Catherine [name] struggles with emotional eating and using food for comfort to the extent that it is negatively impacting her health. She often snacks when she is not hungry. Catherine [name] sometimes feels she is out of control and then feels guilty that she made poor food choices. She has been working on behavior modification techniques to help reduce her emotional eating and has been somewhat successful. She shows no sign of suicidal or homicidal ideations.  Depression screen [facility] 2/9 01/18/2018 10/28/2017 07/14/2017 05/18/2017 03/24/2017  Decreased Interest 0 0 0 0 0  Down, Depressed, Hopeless 0 0 0 0 0  PHQ - 2 Score 0 0 0 0 0  Altered sleeping - - - - -  Tired, decreased energy - - - - -  Change in appetite - - - - -  Feeling bad or failure about yourself  - - - - -  Trouble concentrating - - - - -  Moving slowly or fidgety/restless - - - - -  Suicidal  thoughts - - - - -  PHQ-9 Score - - - - -  Some encounter information is confidential and restricted. Go to Review Flowsheets activity to see all data.    ALLERGIES: Allergies  Allergen Reactions  . Citrus Swelling    MEDICATIONS: Current Outpatient Medications on File Prior to Visit  Medication Sig Dispense Refill  . aspirin-acetaminophen-caffeine (EXCEDRIN MIGRAINE) 250-250-65 MG per tablet Take 2 tablets by mouth 2 (two) times daily as needed for migraine. For headache     . budesonide (PULMICORT) 180 MCG/ACT inhaler Inhale 1 puff into the lungs 2 (two) times daily. 1 Inhaler 1  . buPROPion (WELLBUTRIN SR) 200 MG 12 hr tablet Take 1 tablet (200 mg total) by mouth daily at 12 noon. 30 tablet 0  . cetirizine (ZYRTEC) 10 MG tablet Take 10 mg by mouth daily.    . diclofenac (CATAFLAM) 50 MG tablet Take 50 mg by mouth 2 (two) times daily at 10 AM and 5 PM.    . diclofenac sodium (VOLTAREN) 1 % GEL Apply 2 g topically 4 (four) times daily. 100 g 1  . furosemide (LASIX) 20 MG tablet Take 1 tablet (20 mg total) by mouth 2 (two) times daily. (Patient taking differently: Take 20 mg by mouth daily. ) 60 tablet 3  . gabapentin (NEURONTIN) 300 MG capsule TAKE 2 CAPSULES (600 MG TOTAL) BY MOUTH 3  TIMES A DAY 180 capsule 3  . ibuprofen (ADVIL,MOTRIN) 200 MG tablet Take 600-800 mg by mouth every 8 (eight) hours as needed (for pain.).     . Insulin Pen Needle (BD PEN NEEDLE NANO U/F) 32G X 4 MM MISC 1 Package by Does not apply route daily at 12 noon. 100 each 0  . Liraglutide -Weight Management (SAXENDA) 18 MG/3ML SOPN Inject 3 mg into the skin daily. 5 pen 0  . meloxicam (MOBIC) 15 MG tablet Take 1 tablet (15 mg total) by mouth daily. 30 tablet 0  . polyethylene glycol powder (GLYCOLAX/MIRALAX) powder Take 17 g by mouth daily as needed. 3350 g 1  . ranitidine (ZANTAC) 150 MG tablet Take 1 tablet (150 mg total) by mouth 2 (two) times daily. 60 tablet 0  . Vitamin D, Ergocalciferol, (DRISDOL) 50000  units CAPS capsule Take 1 capsule (50,000 Units total) by mouth every 7 (seven) days. 4 capsule 0   No current facility-administered medications on file prior to visit.     PAST MEDICAL HISTORY: Past Medical History:  Diagnosis Date  . Allergy   . Anxiety   . Arthritis   . Asthma    since baby, seasonal  . ASTHMA, UNSPECIFIED 12/10/2006   Qualifier: Diagnosis of  By: Abundio Miu    . CELLULITIS AND ABSCESS OF LEG EXCEPT FOOT 12/30/2007   Qualifier: Diagnosis of  By: Daphine Deutscher FNP, Zena Amos    . Cyst of bone    right side of spine  . DEPENDENT EDEMA, LEGS, BILATERAL 11/22/2008   Qualifier: Diagnosis of  By: Barbaraann Barthel MD, [place]    . Depression 2003Dx  . Edema   . Food allergy    citris  . FOOT PAIN, RIGHT 11/22/2008   Qualifier: Diagnosis of  By: Barbaraann Barthel MD, [place]    . Gastric ulcer   . GERD (gastroesophageal reflux disease)   . Gout   . Hypertension    took Norvasc for 3 months, doctor discontinued no longer on BP medications  . Joint pain   . KNEE INJURY, LEFT 01/01/2009   Qualifier: Diagnosis of  By: Delrae Alfred MD, Lanora Manis    . Migraine   . ONYCHOMYCOSIS, TOENAILS 11/22/2008   Qualifier: Diagnosis of  By: Barbaraann Barthel MD, [place]    . Shortness of breath   . Sleep apnea   . TINEA PEDIS 11/22/2008   Qualifier: Diagnosis of  By: Barbaraann Barthel MD, Benetta Spar      PAST SURGICAL HISTORY: Past Surgical History:  Procedure Laterality Date  . DILITATION & CURRETTAGE/HYSTROSCOPY WITH NOVASURE ABLATION N/A 07/03/2017   Procedure: DILATATION & CURETTAGE/HYSTEROSCOPY WITH NOVASURE ABLATION;  Surgeon: Genia Del, MD;  Location: [facility];  Service: Gynecology;  Laterality: N/A;  . UPPER GI ENDOSCOPY    . WISDOM TOOTH EXTRACTION  2008   x4    SOCIAL HISTORY: Social History   Tobacco Use  . Smoking status: Former Smoker    Packs/day: 0.50    Years: 18.00    Pack years: 9.00    Types: Cigarettes    Start date: 10/13/1996    Last attempt to quit: 01/12/2015    Years since  quitting: 3.4  . Smokeless tobacco: Never Used  Substance Use Topics  . Alcohol use: Yes    Alcohol/week: 1.0 standard drinks    Types: 1 Standard drinks or equivalent per week    Comment: rarely drinks  . Drug use: No    Comment: marjuana - former use, quit 4 years ago    FAMILY  HISTORY: Family History  Problem Relation Age of Onset  . Hypertension Mother   . Hyperlipidemia Mother   . Cancer Mother        bone cancer  . Heart disease Mother   . Sudden death Mother   . Stroke Mother   . Thyroid disease Mother   . Sleep apnea Mother   . Obesity Mother   . Sudden death Father   . Hypertension Father   . Heart disease Father   . Hyperlipidemia Father   . Prostate cancer Father   . Lung cancer Father   . Obesity Father   . Alcoholism Father   . Sudden death Brother   . Hypertension Brother   . Hyperlipidemia Brother   . Heart attack Brother   . Heart disease Brother   . Stroke Brother   . Alcoholism Brother   . Drug abuse Brother   . Heart disease Sister   . Hypertension Sister   . Thyroid cancer Sister   . Diabetes Neg Hx   . Colon cancer Neg Hx   . Stomach cancer Neg Hx   . Esophageal cancer Neg Hx   . Rectal cancer Neg Hx   . Liver cancer Neg Hx     ROS: Review of Systems  Constitutional: Negative for weight loss.  Gastrointestinal: Negative for nausea and vomiting.  Musculoskeletal:       Negative muscle weakness  Psychiatric/Behavioral: Positive for depression. Negative for suicidal ideas.    PHYSICAL EXAM: Blood pressure 126/84, pulse 97, temperature 98.4 F (36.9 C), temperature source Oral, height 5\' 7"  (1.702 m), weight (!) 424 lb (192.3 kg), SpO2 97 %. Body mass index is 66.41 kg/m. Physical Exam  Constitutional: She is oriented to person, place, and time. She appears well-developed and well-nourished.  Cardiovascular: Normal rate.  Pulmonary/Chest: Effort normal.  Musculoskeletal: Normal range of motion.  Neurological: She is oriented to  person, place, and time.  Skin: Skin is warm and dry.  Psychiatric: She has a normal mood and affect. Her behavior is normal.  Vitals reviewed.   RECENT LABS AND TESTS: BMET    Component Value Date/Time   NA 142 05/24/2018 1044   K 4.3 05/24/2018 1044   CL 109 (H) 05/24/2018 1044   CO2 15 (L) 05/24/2018 1044   GLUCOSE 79 05/24/2018 1044   GLUCOSE 83 06/04/2016 1155   BUN 17 05/24/2018 1044   CREATININE 0.88 05/24/2018 1044   CREATININE 0.90 06/04/2016 1155   CALCIUM 9.0 05/24/2018 1044   GFRNONAA 85 05/24/2018 1044   GFRNONAA 84 06/04/2016 1155   GFRAA 98 05/24/2018 1044   GFRAA >89 06/04/2016 1155   Lab Results  Component Value Date   HGBA1C 5.4 05/24/2018   HGBA1C 5.3 12/10/2017   HGBA1C 5.4 06/25/2017   HGBA1C 5.2 05/19/2017   HGBA1C 5.4 01/20/2017   Lab Results  Component Value Date   INSULIN 28.3 (H) 05/24/2018   INSULIN 43.6 (H) 12/10/2017   INSULIN 30.4 (H) 06/25/2017   INSULIN 29.7 (H) 01/20/2017   INSULIN 30.1 (H) 10/27/2016   CBC    Component Value Date/Time   WBC 10.6 12/10/2017 1242   WBC 13.3 (H) 07/03/2017 0610   RBC 4.64 12/10/2017 1242   RBC 4.33 07/03/2017 0610   HGB 12.5 12/10/2017 1242   HCT 38.4 12/10/2017 1242   PLT 293 07/03/2017 0610   PLT 343 05/18/2017 1611   MCV 83 12/10/2017 1242   MCH 26.9 12/10/2017 1242   MCH 25.9 (L)  07/03/2017 0610   MCHC 32.6 12/10/2017 1242   MCHC 32.4 07/03/2017 0610   RDW 16.2 (H) 12/10/2017 1242   LYMPHSABS 5.2 (H) 12/10/2017 1242   MONOABS 0.6 07/03/2017 0610   EOSABS 0.0 12/10/2017 1242   BASOSABS 0.0 12/10/2017 1242   Iron/TIBC/Ferritin/ %Sat    Component Value Date/Time   IRON 25 (L) 05/18/2017 1611   TIBC 363 05/18/2017 1611   FERRITIN 16 05/18/2017 1611   IRONPCTSAT 7 (LL) 05/18/2017 1611   Lipid Panel     Component Value Date/Time   CHOL 146 05/24/2018 1044   TRIG 53 05/24/2018 1044   HDL 41 05/24/2018 1044   CHOLHDL 3.6 11/21/2015 1047   VLDL 10 11/21/2015 1047   LDLCALC 94  05/24/2018 1044   Hepatic Function Panel     Component Value Date/Time   PROT 7.3 05/24/2018 1044   ALBUMIN 4.1 05/24/2018 1044   AST 29 05/24/2018 1044   ALT 24 05/24/2018 1044   ALKPHOS 70 05/24/2018 1044   BILITOT 0.3 05/24/2018 1044      Component Value Date/Time   TSH 1.43 05/19/2017 1245   TSH 1.460 10/27/2016 1058   TSH 0.96 11/21/2015 1047  Results for BESSY, REANEY (MRN [id number]) as of 06/07/2018 12:39  Ref. Range 05/24/2018 10:44  Vitamin D, 25-Hydroxy Latest Ref Range: 30.0 - 100.0 ng/mL 39.4    ASSESSMENT AND PLAN: Vitamin D deficiency - Plan: Vitamin D, Ergocalciferol, (DRISDOL) 50000 units CAPS capsule  Other depression - with emotional eating - Plan: buPROPion (WELLBUTRIN SR) 200 MG 12 hr tablet  At risk for osteoporosis  Class 3 severe obesity with serious comorbidity and body mass index (BMI) of 60.0 to 69.9 in adult, unspecified obesity type (HCC) - Plan: Liraglutide -Weight Management (SAXENDA) 18 MG/3ML SOPN  PLAN:  Vitamin D Deficiency Catherine [name] was informed that low vitamin D levels contributes to fatigue and are associated with obesity, breast, and colon cancer. Catherine [name] agrees to continue taking prescription Vit D @50 ,000 IU every week #4 and we will refill for 1 month. She will follow up for routine testing of vitamin D, at least 2-3 times per year. She was informed of the risk of over-replacement of vitamin D and agrees to not increase her dose unless she discusses this with [name] first. Catherine [name] agrees to follow up with our clinic in 2 weeks.  At risk for osteopenia and osteoporosis Catherine [name] is at risk for osteopenia and osteoporsis due to her vitamin D deficiency. She was encouraged to take her vitamin D and follow her higher calcium diet and increase strengthening exercise to help strengthen her bones and decrease her risk of osteopenia and osteoporosis.  Depression with Emotional Eating Behaviors We discussed behavior modification techniques today to  help Catherine [name] deal with her emotional eating and depression. Catherine [name] agrees to continue taking bupropion 200 mg qd #30 and we will refill for 1 month. Catherine [name] agrees to follow up with our clinic in 2 weeks.  Obesity Catherine [name] is currently in the action stage of change. As such, her goal is to continue with weight loss efforts She has agreed to follow the Category 3 plan Catherine [name] has been instructed to work up to a goal of 150 minutes of combined cardio and strengthening exercise per week for weight loss and overall health benefits. We discussed the following Behavioral Modification Strategies today: work on meal planning and easy cooking plans and planning for success We discussed various medication options to help Catherine [name] with her weight loss efforts and  we both agreed to continue Liraglutide 3 mg q daily #5 pens and we will refill for 1 month.   Catherine [name] has agreed to follow up with our clinic in 2 weeks. She was informed of the importance of frequent follow up visits to maximize her success with intensive lifestyle modifications for her multiple health conditions.   OBESITY BEHAVIORAL INTERVENTION VISIT  Today's visit was # 36.  Starting weight: 468 lbs Starting date: 10/27/16 Today's weight : 424 lbs  Today's date: 06/07/2018 Total lbs lost to date: 43    ASK: We discussed the diagnosis of obesity with Catherine [name] today and Catherine [name] agreed to give [name] permission to discuss obesity behavioral modification therapy today.  ASSESS: Catherine [name] has the diagnosis of obesity and her BMI today is 66.39 Catherine [name] is in the action stage of change   ADVISE: Catherine [name] was educated on the multiple health risks of obesity as well as the benefit of weight loss to improve her health. She was advised of the need for long term treatment and the importance of lifestyle modifications.  AGREE: Multiple dietary modification options and treatment options were discussed and  Catherine [name] agreed to the above obesity  treatment plan.  Trude Mcburney, am acting as transcriptionist for Alois Cliche, PA-C I, Alois Cliche, PA-C have reviewed above note and agree with its content

## 2018-06-07 NOTE — Therapy (Signed)
[facility] [address] [place], [place], [address] Phone: [phone]   Fax:  [phone]  Physical Therapy Treatment  Patient Details  Name: Catherine [name] MRN: [id number] Date of Birth: 08/07/1981 Referring Provider: Dr Reino Bellis    Encounter Date: 06/07/2018  PT End of Session - 06/07/18 0940    Visit Number  12    Number of Visits  16    Date for PT Re-Evaluation  06/24/18    PT Start Time  0901   patient 15 minutes late   PT Stop Time  0932    PT Time Calculation (min)  31 min    Activity Tolerance  Patient tolerated treatment well    Behavior During Therapy  [facility] for tasks assessed/performed       Past Medical History:  Diagnosis Date  . Allergy   . Anxiety   . Arthritis   . Asthma    since baby, seasonal  . ASTHMA, UNSPECIFIED 12/10/2006   Qualifier: Diagnosis of  By: Abundio Miu    . CELLULITIS AND ABSCESS OF LEG EXCEPT FOOT 12/30/2007   Qualifier: Diagnosis of  By: Daphine Deutscher FNP, Zena Amos    . Cyst of bone    right side of spine  . DEPENDENT EDEMA, LEGS, BILATERAL 11/22/2008   Qualifier: Diagnosis of  By: Barbaraann Barthel MD, [place]    . Depression 2003Dx  . Edema   . Food allergy    citris  . FOOT PAIN, RIGHT 11/22/2008   Qualifier: Diagnosis of  By: Barbaraann Barthel MD, [place]    . Gastric ulcer   . GERD (gastroesophageal reflux disease)   . Gout   . Hypertension    took Norvasc for 3 months, doctor discontinued no longer on BP medications  . Joint pain   . KNEE INJURY, LEFT 01/01/2009   Qualifier: Diagnosis of  By: Delrae Alfred MD, Lanora Manis    . Migraine   . ONYCHOMYCOSIS, TOENAILS 11/22/2008   Qualifier: Diagnosis of  By: Barbaraann Barthel MD, [place]    . Shortness of breath   . Sleep apnea   . TINEA PEDIS 11/22/2008   Qualifier: Diagnosis of  By: Barbaraann Barthel MD, [place]      Past Surgical History:  Procedure Laterality Date  . DILITATION & CURRETTAGE/HYSTROSCOPY WITH NOVASURE ABLATION N/A 07/03/2017   Procedure: DILATATION & CURETTAGE/HYSTEROSCOPY WITH NOVASURE ABLATION;  Surgeon: Genia Del, MD;  Location: [facility];  Service: Gynecology;  Laterality: N/A;  . UPPER GI ENDOSCOPY    . WISDOM TOOTH EXTRACTION  2008   x4    There were no vitals filed for this visit.  Subjective Assessment - 06/07/18 0904    Subjective  right knee 2/10,  3/10.  She has been stretching.  Lt knee is not hurting    Currently in Pain?  Yes    Pain Location  --   right knee 2/10,  shin 3/10   Pain Orientation  Right;Anterior;Distal    Pain Radiating Towards  shin    Pain Frequency  Intermittent    Aggravating Factors   walking,  flexion foot,  wakes with pain    Pain Relieving Factors  tape PT,  compression scoks,  shower  stretching,  warmer mattress warmer                        [facility] Adult PT Treatment/Exercise - 06/07/18 0001      Knee/Hip Exercises: Stretches   Passive Hamstring Stretch  1 rep;30 seconds  Quad Stretch  2 reps;20 seconds      Manual Therapy   Manual Therapy  Taping    Manual therapy comments  instrument assist, retrograde soft tissue work  tissue softened    Joint Mobilization  P/A glides with motion to increase flexion   PROM in to flexion with distal patellar glides decreases pai   Soft tissue mobilization  anterior tib    Agricultural consultantKinesiotex  Create Space      Kinesiotix   Create Space  anterito tim             PT Education - 06/07/18 [phone]    Education Details  use cane as needd,  anatomy patellar position    Person(s) Educated  Patient    Methods  Explanation    Comprehension  Verbalized understanding       PT Short Term Goals - 05/26/18 [id number]      PT SHORT TERM GOAL #1   Title  Patient will demostrate full active bilateral knee extension     Baseline  full AROm extension bilateral    Time  4    Period  Weeks    Status  Achieved      PT SHORT TERM GOAL #2   Title  Patient will increase bilateral lower extremity strnegth to 5/5      Baseline  Lt knee 5/5  RT hip add 3+/5      PT SHORT TERM GOAL #3   Title  Patient will be independent with taping if indicated     Baseline  patella deviates medially ,  less popping may not need taping.    Time  4    Period  Weeks    Status  On-going      PT SHORT TERM GOAL #4   Title  Patient will be independent with basic HEP     Baseline  indrependent    Time  4    Period  Weeks    Status  Achieved        PT Long Term Goals - 05/26/18 [id number]      PT LONG TERM GOAL #1   Title  Patient will go up and down 13 steps without popping or pain in bilateral knees in order to get in and out of her appartment.     Baseline  pops left knee and pain 2/10 right knee descending    Time  8    Period  Weeks    Status  On-going      PT LONG TERM GOAL #2   Title  Patient will be independent with long term strengthening program in order to protect her knee long term     Baseline  consistant so far  has started exercises with band from previous PT    Time  8    Period  Weeks    Status  On-going      PT LONG TERM GOAL #3   Title  Patient will demostrate a 41% limitation on FOTO     Baseline  57% limitation    Time  8    Period  Weeks    Status  On-going            Plan - 06/07/18 0940    Clinical Impression Statement  DF improved from 0 to 10 Degrees.  shin pain decreased with flexion with distal patellar glides.  Less pain noted at end of session.  Short session due to patient late.  PT Next Visit Plan  quad stretch with distal patellar glides.  Anterior tib pain,, consider patellar tap distally    PT Home Exercise Plan  knee extension strtch; gastroc stretch; patellar mobilization, bridges, SLR, standing calf stretch, hip abduction     Consulted and Agree with Plan of Care  Patient       Patient will benefit from skilled therapeutic intervention in order to improve the following deficits and impairments:     Visit Diagnosis: Chronic pain of right knee  Chronic pain of  left knee  Stiffness of right knee, not elsewhere classified  Stiffness of left knee, not elsewhere classified  Difficulty in walking, not elsewhere classified     Problem List Patient Active Problem List   Diagnosis Date Noted  . Class 3 obesity with serious comorbidity and body mass index (BMI) of 60.0 to 69.9 in adult 06/10/2017  . Fluid retention 06/10/2017  . Menorrhagia 05/18/2017  . Sacral radiculopathy 02/04/2017  . Insulin resistance 02/02/2017  . At risk for violence to self related to depression 02/02/2017  . Morbid obesity (HCC) 01/01/2017  . Sore throat (viral) 11/21/2016  . Vitamin D deficiency 11/12/2016  . Prediabetes 11/12/2016  . Asthma 07/25/2016  . Abdominal pain 06/04/2016  . Otitis, externa, infective 06/04/2016  . Dysuria 03/20/2016  . Gout 11/21/2015  . Obesity, morbid, BMI 50 or higher (HCC) 05/14/2015  . BIPOLAR DISORDER UNSPECIFIED 11/22/2008  . Depression 12/10/2006  . MIGRAINE, UNSPEC., W/O INTRACTABLE MIGRAINE 12/10/2006  . GASTROESOPHAGEAL REFLUX, NO ESOPHAGITIS 12/10/2006  . OSA (obstructive sleep apnea) 12/10/2006    HARRIS,KAREN PTA 06/07/2018, 9:44 AM  [facility] [address] [place], [place], [address] Phone: [phone]   Fax:  [phone]  Name: AALYAH MANSOURI MRN: [id number] Date of Birth: 1981-07-07

## 2018-06-09 ENCOUNTER — Encounter: Payer: Self-pay | Admitting: Physical Therapy

## 2018-06-09 ENCOUNTER — Ambulatory Visit: Payer: 59 | Admitting: Physical Therapy

## 2018-06-09 DIAGNOSIS — M25662 Stiffness of left knee, not elsewhere classified: Secondary | ICD-10-CM | POA: Diagnosis not present

## 2018-06-09 DIAGNOSIS — M25661 Stiffness of right knee, not elsewhere classified: Secondary | ICD-10-CM

## 2018-06-09 DIAGNOSIS — M25562 Pain in left knee: Secondary | ICD-10-CM

## 2018-06-09 DIAGNOSIS — R262 Difficulty in walking, not elsewhere classified: Secondary | ICD-10-CM

## 2018-06-09 DIAGNOSIS — G8929 Other chronic pain: Secondary | ICD-10-CM | POA: Diagnosis not present

## 2018-06-09 DIAGNOSIS — M25561 Pain in right knee: Principal | ICD-10-CM

## 2018-06-09 NOTE — Therapy (Signed)
[place], [place], [address] Phone: [phone]   Fax:  [phone]  Physical Therapy Treatment  Patient Details  Name: Catherine [name] MRN: [id number] Date of Birth: 1981/04/15 Referring Provider: Dr Lilia Argue    Encounter Date: 06/09/2018  PT End of Session - 06/09/18 1727    Visit Number  13    Number of Visits  16    Date for PT Re-Evaluation  06/24/18    PT Start Time  1632    PT Stop Time  8527    PT Time Calculation (min)  43 min    Activity Tolerance  Patient tolerated treatment well    Behavior During Therapy  [facility] for tasks assessed/performed       Past Medical History:  Diagnosis Date  . Allergy   . Anxiety   . Arthritis   . Asthma    since baby, seasonal  . ASTHMA, UNSPECIFIED 12/10/2006   Qualifier: Diagnosis of  By: Eusebio Friendly    . CELLULITIS AND ABSCESS OF LEG EXCEPT FOOT 12/30/2007   Qualifier: Diagnosis of  By: Hassell Done FNP, Tori Milks    . Cyst of bone    right side of spine  . DEPENDENT EDEMA, LEGS, BILATERAL 11/22/2008   Qualifier: Diagnosis of  By: Radene Ou MD, [place]    . Depression 2003Dx  . Edema   . Food allergy    citris  . FOOT PAIN, RIGHT 11/22/2008   Qualifier: Diagnosis of  By: Radene Ou MD, [place]    . Gastric ulcer   . GERD (gastroesophageal reflux disease)   . Gout   . Hypertension    took Norvasc for 3 months, doctor discontinued no longer on BP medications  . Joint pain   . KNEE INJURY, LEFT 01/01/2009   Qualifier: Diagnosis of  By: Amil Amen MD, Benjamine Mola    . Migraine   . ONYCHOMYCOSIS, TOENAILS 11/22/2008   Qualifier: Diagnosis of  By: Radene Ou MD, [place]    . Shortness of breath   . Sleep apnea   . TINEA PEDIS 11/22/2008   Qualifier: Diagnosis of  By: Radene Ou MD, [place]      Past Surgical History:  Procedure Laterality Date  . DILITATION & CURRETTAGE/HYSTROSCOPY WITH NOVASURE ABLATION N/A 07/03/2017   Procedure: DILATATION &  CURETTAGE/HYSTEROSCOPY WITH NOVASURE ABLATION;  Surgeon: Princess Bruins, MD;  Location: [facility];  Service: Gynecology;  Laterality: N/A;  . UPPER GI ENDOSCOPY    . WISDOM TOOTH EXTRACTION  2008   x4    There were no vitals filed for this visit.  Subjective Assessment - 06/09/18 1637    Subjective  1/10 today,   Did have 9/10 pain night after last visit (Monday  may have the storm.  patella work and knee mobs. helped a lot the last session.      Currently in Pain?  Yes    Pain Score  1    up to 9/10   Pain Orientation  Right    Pain Descriptors / Indicators  Aching    Pain Type  Chronic pain    Pain Radiating Towards  shin    Pain Frequency  Intermittent    Aggravating Factors   weather change    Pain Relieving Factors  manual , tape mobs                       [facility] Adult PT Treatment/Exercise - 06/09/18 0001      Self-Care  Self-Care  Other Self-Care Comments    Other Self-Care Comments   options for exercise post PT,  She is considering a return to aquatic class and instead of aerobic she is considering the arthritis class.       Knee/Hip Exercises: Clinical research associate  --   checked for flexibility 1 rep 10 seconds     Knee/Hip Exercises: Machines for Strengthening   Total Gym Leg Press  1 plate 10 x 2 sets both,  able to do without pain      Ultrasound   Ultrasound Location  anterior medial knee    Ultrasound Parameters  1 mHz, 100%,  1.5 watts/cm2    Ultrasound Goals  Pain      Manual Therapy   Manual Therapy  Taping    Joint Mobilization  P/A glides with motion to increase flexion   PROM in to flexion with distal patellar glides decreases pai   Kinesiotex  Ligament Correction      Kinesiotix   Ligament Correction  best fit this space ,  distal quads to encourage patella down to increase flexion               PT Short Term Goals - 06/09/18 1731      PT SHORT TERM GOAL #1   Title  Patient will demostrate full active bilateral  knee extension     Time  4    Period  Weeks    Status  Achieved      PT SHORT TERM GOAL #2   Title  Patient will increase bilateral lower extremity strnegth to 5/5     Time  4    Period  Weeks    Status  Unable to assess      PT SHORT TERM GOAL #3   Title  Patient will be independent with taping if indicated     Baseline  tape continues to be decided if she still needs    Time  4    Period  Weeks    Status  On-going      PT SHORT TERM GOAL #4   Title  Patient will be independent with basic HEP     Baseline  indrependent    Time  4    Period  Weeks    Status  Achieved        PT Long Term Goals - 06/09/18 1732      PT LONG TERM GOAL #1   Title  Patient will go up and down 13 steps without popping or pain in bilateral knees in order to get in and out of her appartment.     Baseline  pops  at times no pain.  Consistant?    Time  8    Period  Weeks    Status  Partially Met      PT LONG TERM GOAL #2   Title  Patient will be independent with long term strengthening program in order to protect her knee long term     Baseline  consistant,  She plans to return to the gym,  no treadmill,   will do pool and leg press for LE    Time  8    Period  Weeks    Status  On-going      PT LONG TERM GOAL #3   Title  Patient will demostrate a 41% limitation on FOTO     Time  8    Period  Weeks  Status  Unable to assess            Plan - 06/09/18 1728    Clinical Impression Statement  Shin pain intermittant and is not hurting today except walking up slight hill to out clinic.  Manual and modalities today used to address pain anterior knee.  She tolerated leg press without increase in pain.  LTG#1 partially met.No pain at end of session.     PT Next Visit Plan  Needs to see david  next.  POC through 06/24/2018.  She may be ready for D/C to HEP.  She is considering aquatic  arthritis class. Assess tape.    PT Home Exercise Plan  knee extension strtch; gastroc stretch; patellar  mobilization, bridges, SLR, standing calf stretch, hip abduction     Consulted and Agree with Plan of Care  Patient       Patient will benefit from skilled therapeutic intervention in order to improve the following deficits and impairments:     Visit Diagnosis: Chronic pain of right knee  Chronic pain of left knee  Stiffness of right knee, not elsewhere classified  Stiffness of left knee, not elsewhere classified  Difficulty in walking, not elsewhere classified     Problem List Patient Active Problem List   Diagnosis Date Noted  . Class 3 obesity with serious comorbidity and body mass index (BMI) of 60.0 to 69.9 in adult 06/10/2017  . Fluid retention 06/10/2017  . Menorrhagia 05/18/2017  . Sacral radiculopathy 02/04/2017  . Insulin resistance 02/02/2017  . At risk for violence to self related to depression 02/02/2017  . Morbid obesity ([place]) 01/01/2017  . Sore throat (viral) 11/21/2016  . Vitamin D deficiency 11/12/2016  . Prediabetes 11/12/2016  . Asthma 07/25/2016  . Abdominal pain 06/04/2016  . Otitis, externa, infective 06/04/2016  . Dysuria 03/20/2016  . Gout 11/21/2015  . Obesity, morbid, BMI 50 or higher ([place]) 05/14/2015  . BIPOLAR DISORDER UNSPECIFIED 11/22/2008  . Depression 12/10/2006  . MIGRAINE, UNSPEC., W/O INTRACTABLE MIGRAINE 12/10/2006  . GASTROESOPHAGEAL REFLUX, NO ESOPHAGITIS 12/10/2006  . OSA (obstructive sleep apnea) 12/10/2006    Lillias Difrancesco   PTA 06/09/2018, 5:36 PM  [facility] [address] [place], [place], [address] Phone: [phone]   Fax:  [phone]  Name: JAMAIYAH PYLE MRN: [id number] Date of Birth: 1981/08/01

## 2018-06-21 ENCOUNTER — Ambulatory Visit (INDEPENDENT_AMBULATORY_CARE_PROVIDER_SITE_OTHER): Payer: 59 | Admitting: Physician Assistant

## 2018-06-21 ENCOUNTER — Ambulatory Visit: Payer: 59 | Attending: Sports Medicine | Admitting: Physical Therapy

## 2018-06-21 ENCOUNTER — Encounter: Payer: Self-pay | Admitting: Physical Therapy

## 2018-06-21 VITALS — BP 118/77 | HR 89 | Temp 98.4°F | Ht 67.0 in | Wt >= 6400 oz

## 2018-06-21 DIAGNOSIS — R262 Difficulty in walking, not elsewhere classified: Secondary | ICD-10-CM | POA: Diagnosis present

## 2018-06-21 DIAGNOSIS — M25562 Pain in left knee: Secondary | ICD-10-CM | POA: Insufficient documentation

## 2018-06-21 DIAGNOSIS — G8929 Other chronic pain: Secondary | ICD-10-CM | POA: Diagnosis present

## 2018-06-21 DIAGNOSIS — Z6841 Body Mass Index (BMI) 40.0 and over, adult: Secondary | ICD-10-CM | POA: Diagnosis not present

## 2018-06-21 DIAGNOSIS — M25662 Stiffness of left knee, not elsewhere classified: Secondary | ICD-10-CM | POA: Diagnosis present

## 2018-06-21 DIAGNOSIS — E559 Vitamin D deficiency, unspecified: Secondary | ICD-10-CM

## 2018-06-21 DIAGNOSIS — M25661 Stiffness of right knee, not elsewhere classified: Secondary | ICD-10-CM | POA: Diagnosis present

## 2018-06-21 DIAGNOSIS — M25561 Pain in right knee: Secondary | ICD-10-CM | POA: Diagnosis present

## 2018-06-21 NOTE — Progress Notes (Signed)
Office: [phone]  /  Fax: [phone]   HPI:   Chief Complaint: OBESITY Catherine [name] is here to discuss her progress with her obesity treatment plan. She is on the Category 3 plan and is following her eating plan approximately 80 % of the time. She states she is exercising 0 minutes 0 times per week. Catherine [name] did very well with weight loss. She has been following the plan closely. She would like to start exercising more and is asking about the best workouts to do.  Her weight is (!) 418 lb (189.6 kg) today and has had a weight loss of 6 pounds over a period of 2 weeks since her last visit. She has lost 50 lbs since starting treatment with [name].  Vitamin D Deficiency Catherine [name] has a diagnosis of vitamin D deficiency. She is on prescription Vit D, last level not at goal. She denies nausea, vomiting or muscle weakness.  ALLERGIES: Allergies  Allergen Reactions  . Citrus Swelling    MEDICATIONS: Current Outpatient Medications on File Prior to Visit  Medication Sig Dispense Refill  . aspirin-acetaminophen-caffeine (EXCEDRIN MIGRAINE) 250-250-65 MG per tablet Take 2 tablets by mouth 2 (two) times daily as needed for migraine. For headache     . budesonide (PULMICORT) 180 MCG/ACT inhaler Inhale 1 puff into the lungs 2 (two) times daily. 1 Inhaler 1  . buPROPion (WELLBUTRIN SR) 200 MG 12 hr tablet Take 1 tablet (200 mg total) by mouth daily at 12 noon. 30 tablet 0  . cetirizine (ZYRTEC) 10 MG tablet Take 10 mg by mouth daily.    . diclofenac (CATAFLAM) 50 MG tablet Take 50 mg by mouth 2 (two) times daily at 10 AM and 5 PM.    . diclofenac sodium (VOLTAREN) 1 % GEL Apply 2 g topically 4 (four) times daily. 100 g 1  . furosemide (LASIX) 20 MG tablet Take 1 tablet (20 mg total) by mouth 2 (two) times daily. (Patient taking differently: Take 20 mg by mouth daily. ) 60 tablet 3  . gabapentin (NEURONTIN) 300 MG capsule TAKE 2 CAPSULES (600 MG TOTAL) BY MOUTH 3 TIMES A DAY 180 capsule 3  . ibuprofen  (ADVIL,MOTRIN) 200 MG tablet Take 600-800 mg by mouth every 8 (eight) hours as needed (for pain.).     . Insulin Pen Needle (BD PEN NEEDLE NANO U/F) 32G X 4 MM MISC 1 Package by Does not apply route daily at 12 noon. 100 each 0  . Liraglutide -Weight Management (SAXENDA) 18 MG/3ML SOPN Inject 3 mg into the skin daily. 5 pen 0  . meloxicam (MOBIC) 15 MG tablet Take 1 tablet (15 mg total) by mouth daily. 30 tablet 0  . polyethylene glycol powder (GLYCOLAX/MIRALAX) powder Take 17 g by mouth daily as needed. 3350 g 1  . ranitidine (ZANTAC) 150 MG tablet Take 1 tablet (150 mg total) by mouth 2 (two) times daily. 60 tablet 0  . Vitamin D, Ergocalciferol, (DRISDOL) 50000 units CAPS capsule Take 1 capsule (50,000 Units total) by mouth every 7 (seven) days. 4 capsule 0   No current facility-administered medications on file prior to visit.     PAST MEDICAL HISTORY: Past Medical History:  Diagnosis Date  . Allergy   . Anxiety   . Arthritis   . Asthma    since baby, seasonal  . ASTHMA, UNSPECIFIED 12/10/2006   Qualifier: Diagnosis of  By: Abundio Miu    . CELLULITIS AND ABSCESS OF LEG EXCEPT FOOT 12/30/2007   Qualifier: Diagnosis of  By: Daphine Deutscher FNP, Zena Amos    . Cyst of bone    right side of spine  . DEPENDENT EDEMA, LEGS, BILATERAL 11/22/2008   Qualifier: Diagnosis of  By: Barbaraann Barthel MD, [place]    . Depression 2003Dx  . Edema   . Food allergy    citris  . FOOT PAIN, RIGHT 11/22/2008   Qualifier: Diagnosis of  By: Barbaraann Barthel MD, [place]    . Gastric ulcer   . GERD (gastroesophageal reflux disease)   . Gout   . Hypertension    took Norvasc for 3 months, doctor discontinued no longer on BP medications  . Joint pain   . KNEE INJURY, LEFT 01/01/2009   Qualifier: Diagnosis of  By: Delrae Alfred MD, Lanora Manis    . Migraine   . ONYCHOMYCOSIS, TOENAILS 11/22/2008   Qualifier: Diagnosis of  By: Barbaraann Barthel MD, [place]    . Shortness of breath   . Sleep apnea   . TINEA PEDIS 11/22/2008   Qualifier:  Diagnosis of  By: Barbaraann Barthel MD, Benetta Spar      PAST SURGICAL HISTORY: Past Surgical History:  Procedure Laterality Date  . DILITATION & CURRETTAGE/HYSTROSCOPY WITH NOVASURE ABLATION N/A 07/03/2017   Procedure: DILATATION & CURETTAGE/HYSTEROSCOPY WITH NOVASURE ABLATION;  Surgeon: Genia Del, MD;  Location: [facility];  Service: Gynecology;  Laterality: N/A;  . UPPER GI ENDOSCOPY    . WISDOM TOOTH EXTRACTION  2008   x4    SOCIAL HISTORY: Social History   Tobacco Use  . Smoking status: Former Smoker    Packs/day: 0.50    Years: 18.00    Pack years: 9.00    Types: Cigarettes    Start date: 10/13/1996    Last attempt to quit: 01/12/2015    Years since quitting: 3.4  . Smokeless tobacco: Never Used  Substance Use Topics  . Alcohol use: Yes    Alcohol/week: 1.0 standard drinks    Types: 1 Standard drinks or equivalent per week    Comment: rarely drinks  . Drug use: No    Comment: marjuana - former use, quit 4 years ago    FAMILY HISTORY: Family History  Problem Relation Age of Onset  . Hypertension Mother   . Hyperlipidemia Mother   . Cancer Mother        bone cancer  . Heart disease Mother   . Sudden death Mother   . Stroke Mother   . Thyroid disease Mother   . Sleep apnea Mother   . Obesity Mother   . Sudden death Father   . Hypertension Father   . Heart disease Father   . Hyperlipidemia Father   . Prostate cancer Father   . Lung cancer Father   . Obesity Father   . Alcoholism Father   . Sudden death Brother   . Hypertension Brother   . Hyperlipidemia Brother   . Heart attack Brother   . Heart disease Brother   . Stroke Brother   . Alcoholism Brother   . Drug abuse Brother   . Heart disease Sister   . Hypertension Sister   . Thyroid cancer Sister   . Diabetes Neg Hx   . Colon cancer Neg Hx   . Stomach cancer Neg Hx   . Esophageal cancer Neg Hx   . Rectal cancer Neg Hx   . Liver cancer Neg Hx     ROS: Review of Systems  Constitutional: Positive for  weight loss.  Gastrointestinal: Negative for nausea and vomiting.  Musculoskeletal:  Negative muscle weakness    PHYSICAL EXAM: Blood pressure 118/77, pulse 89, temperature 98.4 F (36.9 C), temperature source Oral, height 5\' 7"  (1.702 m), weight (!) 418 lb (189.6 kg), SpO2 100 %. Body mass index is 65.47 kg/m. Physical Exam  Constitutional: She is oriented to person, place, and time. She appears well-developed and well-nourished.  Cardiovascular: Normal rate.  Pulmonary/Chest: Effort normal.  Musculoskeletal: Normal range of motion.  Neurological: She is oriented to person, place, and time.  Skin: Skin is warm and dry.  Psychiatric: She has a normal mood and affect. Her behavior is normal.  Vitals reviewed.   RECENT LABS AND TESTS: BMET    Component Value Date/Time   NA 142 05/24/2018 1044   K 4.3 05/24/2018 1044   CL 109 (H) 05/24/2018 1044   CO2 15 (L) 05/24/2018 1044   GLUCOSE 79 05/24/2018 1044   GLUCOSE 83 06/04/2016 1155   BUN 17 05/24/2018 1044   CREATININE 0.88 05/24/2018 1044   CREATININE 0.90 06/04/2016 1155   CALCIUM 9.0 05/24/2018 1044   GFRNONAA 85 05/24/2018 1044   GFRNONAA 84 06/04/2016 1155   GFRAA 98 05/24/2018 1044   GFRAA >89 06/04/2016 1155   Lab Results  Component Value Date   HGBA1C 5.4 05/24/2018   HGBA1C 5.3 12/10/2017   HGBA1C 5.4 06/25/2017   HGBA1C 5.2 05/19/2017   HGBA1C 5.4 01/20/2017   Lab Results  Component Value Date   INSULIN 28.3 (H) 05/24/2018   INSULIN 43.6 (H) 12/10/2017   INSULIN 30.4 (H) 06/25/2017   INSULIN 29.7 (H) 01/20/2017   INSULIN 30.1 (H) 10/27/2016   CBC    Component Value Date/Time   WBC 10.6 12/10/2017 1242   WBC 13.3 (H) 07/03/2017 0610   RBC 4.64 12/10/2017 1242   RBC 4.33 07/03/2017 0610   HGB 12.5 12/10/2017 1242   HCT 38.4 12/10/2017 1242   PLT 293 07/03/2017 0610   PLT 343 05/18/2017 1611   MCV 83 12/10/2017 1242   MCH 26.9 12/10/2017 1242   MCH 25.9 (L) 07/03/2017 0610   MCHC 32.6  12/10/2017 1242   MCHC 32.4 07/03/2017 0610   RDW 16.2 (H) 12/10/2017 1242   LYMPHSABS 5.2 (H) 12/10/2017 1242   MONOABS 0.6 07/03/2017 0610   EOSABS 0.0 12/10/2017 1242   BASOSABS 0.0 12/10/2017 1242   Iron/TIBC/Ferritin/ %Sat    Component Value Date/Time   IRON 25 (L) 05/18/2017 1611   TIBC 363 05/18/2017 1611   FERRITIN 16 05/18/2017 1611   IRONPCTSAT 7 (LL) 05/18/2017 1611   Lipid Panel     Component Value Date/Time   CHOL 146 05/24/2018 1044   TRIG 53 05/24/2018 1044   HDL 41 05/24/2018 1044   CHOLHDL 3.6 11/21/2015 1047   VLDL 10 11/21/2015 1047   LDLCALC 94 05/24/2018 1044   Hepatic Function Panel     Component Value Date/Time   PROT 7.3 05/24/2018 1044   ALBUMIN 4.1 05/24/2018 1044   AST 29 05/24/2018 1044   ALT 24 05/24/2018 1044   ALKPHOS 70 05/24/2018 1044   BILITOT 0.3 05/24/2018 1044      Component Value Date/Time   TSH 1.43 05/19/2017 1245   TSH 1.460 10/27/2016 1058   TSH 0.96 11/21/2015 1047  Results for JADENE, STEMMER (MRN [id number]) as of 06/21/2018 15:44  Ref. Range 05/24/2018 10:44  Vitamin D, 25-Hydroxy Latest Ref Range: 30.0 - 100.0 ng/mL 39.4    ASSESSMENT AND PLAN: Vitamin D deficiency  Class 3 severe obesity with serious comorbidity  and body mass index (BMI) of 60.0 to 69.9 in adult, unspecified obesity type (HCC)  PLAN:  Vitamin D Deficiency Catherine [name] was informed that low vitamin D levels contributes to fatigue and are associated with obesity, breast, and colon cancer. Catherine [name] agrees to continue taking prescription Vit D @50 ,000 IU every week and will follow up for routine testing of vitamin D, at least 2-3 times per year. She was informed of the risk of over-replacement of vitamin D and agrees to not increase her dose unless she discusses this with [name] first. Catherine [name] agrees to follow up with our clinic in 2 weeks.  I spent > than 50% of the 15 minute visit on counseling as documented in the note.  Obesity Catherine [name] is currently in the  action stage of change. As such, her goal is to continue with weight loss efforts She has agreed to follow the Category 3 plan Catherine [name] has been instructed to work up to a goal of 150 minutes of combined cardio and strengthening exercise per week for weight loss and overall health benefits. We discussed the following Behavioral Modification Strategies today: work on meal planning and easy cooking plans and planning for success   Catherine [name] has agreed to follow up with our clinic in 2 weeks. She was informed of the importance of frequent follow up visits to maximize her success with intensive lifestyle modifications for her multiple health conditions.   OBESITY BEHAVIORAL INTERVENTION VISIT  Today's visit was # 37    Starting weight: 468 lbs Starting date: 10/27/16 Today's weight : 418 lbs  Today's date: 06/21/2018 Total lbs lost to date: 50    ASK: We discussed the diagnosis of obesity with Catherine [name] today and Catherine [name] agreed to give [name] permission to discuss obesity behavioral modification therapy today.  ASSESS: Catherine [name] has the diagnosis of obesity and her BMI today is 65.45 Catherine [name] is in the action stage of change   ADVISE: Catherine [name] was educated on the multiple health risks of obesity as well as the benefit of weight loss to improve her health. She was advised of the need for long term treatment and the importance of lifestyle modifications.  AGREE: Multiple dietary modification options and treatment options were discussed and  Catherine [name] agreed to the above obesity treatment plan.  Trude Mcburney, am acting as transcriptionist for Alois Cliche, PA-C I, Alois Cliche, PA-C have reviewed above note and agree with its content

## 2018-06-21 NOTE — Therapy (Addendum)
[place], [place], [address] Phone: [phone]   Fax:  [phone]  Physical Therapy Treatment/ Discharge   Patient Details  Name: Catherine [name] MRN: [id number] Date of Birth: 1981-03-14 Referring Provider: Dr Lilia Argue    Encounter Date: 06/21/2018  PT End of Session - 06/21/18 1443    Visit Number  14    Number of Visits  16    Date for PT Re-Evaluation  06/24/18    Authorization Type  [facility]     PT Start Time  3254    PT Stop Time  1058    PT Time Calculation (min)  43 min    Activity Tolerance  Patient tolerated treatment well    Behavior During Therapy  [facility] for tasks assessed/performed       Past Medical History:  Diagnosis Date  . Allergy   . Anxiety   . Arthritis   . Asthma    since baby, seasonal  . ASTHMA, UNSPECIFIED 12/10/2006   Qualifier: Diagnosis of  By: Eusebio Friendly    . CELLULITIS AND ABSCESS OF LEG EXCEPT FOOT 12/30/2007   Qualifier: Diagnosis of  By: Hassell Done FNP, Tori Milks    . Cyst of bone    right side of spine  . DEPENDENT EDEMA, LEGS, BILATERAL 11/22/2008   Qualifier: Diagnosis of  By: Radene Ou MD, [place]    . Depression 2003Dx  . Edema   . Food allergy    citris  . FOOT PAIN, RIGHT 11/22/2008   Qualifier: Diagnosis of  By: Radene Ou MD, [place]    . Gastric ulcer   . GERD (gastroesophageal reflux disease)   . Gout   . Hypertension    took Norvasc for 3 months, doctor discontinued no longer on BP medications  . Joint pain   . KNEE INJURY, LEFT 01/01/2009   Qualifier: Diagnosis of  By: Amil Amen MD, Benjamine Mola    . Migraine   . ONYCHOMYCOSIS, TOENAILS 11/22/2008   Qualifier: Diagnosis of  By: Radene Ou MD, [place]    . Shortness of breath   . Sleep apnea   . TINEA PEDIS 11/22/2008   Qualifier: Diagnosis of  By: Radene Ou MD, [place]      Past Surgical History:  Procedure Laterality Date  . DILITATION & CURRETTAGE/HYSTROSCOPY WITH NOVASURE ABLATION N/A  07/03/2017   Procedure: DILATATION & CURETTAGE/HYSTEROSCOPY WITH NOVASURE ABLATION;  Surgeon: Princess Bruins, MD;  Location: [facility];  Service: Gynecology;  Laterality: N/A;  . UPPER GI ENDOSCOPY    . WISDOM TOOTH EXTRACTION  2008   x4    There were no vitals filed for this visit.  Subjective Assessment - 06/21/18 1207    Subjective  Patient reports that the pain has imporved since the patellar mobilizations and the taping. She has been doing her exercises.     Limitations  Standing;Walking    How long can you sit comfortably?  Stiffnes when she sits    How long can you stand comfortably?  45 minutes    How long can you walk comfortably?  Can walk around using the shopping cart for assistance     Diagnostic tests  X-ray right: advanced tri-compartmental arthritis     Patient Stated Goals  to have less pain     Currently in Pain?  Yes    Pain Score  2     Pain Orientation  Right;Anterior   right shin    Pain Descriptors / Indicators  Aching  Pain Type  Chronic pain    Pain Radiating Towards  shin     Pain Onset  More than a month ago    Pain Frequency  Intermittent    Aggravating Factors   walking for longer distances     Pain Relieving Factors  taping, patellar mobs     Effect of Pain on Daily Activities  difficulty walking longer distances     Multiple Pain Sites  No                       [facility] Adult PT Treatment/Exercise - 06/21/18 0001      Knee/Hip Exercises: Stretches   Passive Hamstring Stretch  1 rep;30 seconds    Quad Stretch  2 reps;20 seconds      Knee/Hip Exercises: Standing   Heel Raises  Both;1 set;15 reps    Other Standing Knee Exercises  standing march 2x10       Knee/Hip Exercises: Supine   Quad Sets  10 reps    Quad Sets Limitations  5 sec hold     Short Arc Quad Sets  Both;1 set;15 reps    Bridges  Both;1 set;15 reps    Straight Leg Raises  Both;1 set;15 reps      Manual Therapy   Joint Mobilization  P/A glides with motion to  increase flexion   PROM in to flexion with distal patellar glides decreases pai     Kinesiotix   Ligament Correction  best fit this space ,  distal quads to encourage patella down to increase flexion; treviewed how to do it on herself. Able to perfrom without difficulty.              PT Education - 06/21/18 1211    Education Details  reviewed self pattelar mobilization and self taping     Person(s) Educated  Patient;Caregiver(s)    Methods  Explanation;Demonstration;Tactile cues;Verbal cues    Comprehension  Verbalized understanding;Returned demonstration;Verbal cues required;Tactile cues required       PT Short Term Goals - 06/21/18 1447      PT SHORT TERM GOAL #1   Title  Patient will demostrate full active bilateral knee extension     Baseline  full AROm extension bilateral    Time  4    Period  Weeks    Status  Achieved      PT SHORT TERM GOAL #2   Title  Patient will increase bilateral lower extremity strnegth to 5/5     Baseline  hip abduction 4/5     Time  4    Period  Weeks    Status  Not Met      PT SHORT TERM GOAL #3   Title  Patient will be independent with taping if indicated     Baseline  independent today     Time  4    Period  Weeks    Status  Achieved      PT SHORT TERM GOAL #4   Title  Patient will be independent with basic HEP     Baseline  independent    Time  4    Period  Weeks    Status  Achieved        PT Long Term Goals - 06/21/18 1448      PT LONG TERM GOAL #1   Title  Patient will go up and down 13 steps without popping or pain in bilateral knees in order to  get in and out of her appartment.     Baseline  pops  at times no pain.      Time  8    Period  Weeks    Status  Partially Met      PT LONG TERM GOAL #2   Title  Patient will be independent with long term strengthening program in order to protect her knee long term     Baseline  consistant,  She plans to return to the gym,  no treadmill,   will do pool and leg press for LE     Time  8    Period  Weeks    Status  Achieved      PT LONG TERM GOAL #3   Title  Patient will demostrate a 41% limitation on FOTO     Baseline  45    Time  8    Period  Weeks    Status  Not Met            Plan - 06/21/18 1445    Clinical Impression Statement  Patient has reached max potential for therapy. She continues to have some pain into the anterior shin but it is intemittent. She has a complete home exercise program. D/C to HEP at this time. She was shown how to tape herself.     History and Personal Factors relevant to plan of care:  morbid obesity     Clinical Presentation  Evolving    Clinical Decision Making  Moderate    Rehab Potential  Good    PT Frequency  2x / week    PT Duration  4 weeks    PT Treatment/Interventions  ADLs/Self Care Home Management;Cryotherapy;Electrical Stimulation;Iontophoresis 26m/ml Dexamethasone;Ultrasound;Stair training;Gait training;Functional mobility training;Therapeutic activities;Therapeutic exercise;Patient/family education;Neuromuscular re-education;Manual techniques;Passive range of motion;Dry needling;Taping    PT Next Visit Plan  D/C to HEP     PT Home Exercise Plan  knee extension strtch; gastroc stretch; patellar mobilization, bridges, SLR, standing calf stretch, hip abduction     Consulted and Agree with Plan of Care  Patient       Patient will benefit from skilled therapeutic intervention in order to improve the following deficits and impairments:     Visit Diagnosis: Chronic pain of right knee  Chronic pain of left knee  Stiffness of right knee, not elsewhere classified  Stiffness of left knee, not elsewhere classified  Difficulty in walking, not elsewhere classified     Problem List Patient Active Problem List   Diagnosis Date Noted  . Class 3 obesity with serious comorbidity and body mass index (BMI) of 60.0 to 69.9 in adult 06/10/2017  . Fluid retention 06/10/2017  . Menorrhagia 05/18/2017  . Sacral  radiculopathy 02/04/2017  . Insulin resistance 02/02/2017  . At risk for violence to self related to depression 02/02/2017  . Morbid obesity ([place] 01/01/2017  . Sore throat (viral) 11/21/2016  . Vitamin D deficiency 11/12/2016  . Prediabetes 11/12/2016  . Asthma 07/25/2016  . Abdominal pain 06/04/2016  . Otitis, externa, infective 06/04/2016  . Dysuria 03/20/2016  . Gout 11/21/2015  . Obesity, morbid, BMI 50 or higher ([place] 05/14/2015  . BIPOLAR DISORDER UNSPECIFIED 11/22/2008  . Depression 12/10/2006  . MIGRAINE, UNSPEC., W/O INTRACTABLE MIGRAINE 12/10/2006  . GASTROESOPHAGEAL REFLUX, NO ESOPHAGITIS 12/10/2006  . OSA (obstructive sleep apnea) 12/10/2006    PHYSICAL THERAPY DISCHARGE SUMMARY  Visits from Start of Care: 14  Current functional level related to goals / functional outcomes: Improved pain  and ability to ambulate    Remaining deficits: Intermittent pain in her shin    Education / Equipment: HEP   Plan: Patient agrees to discharge.  Patient goals were partially met. Patient is being discharged due to being pleased with the current functional level.  ?????       Carney Living PT DPT  06/21/2018, 2:50 PM  [facility] [address] [place], [place], [address] Phone: [phone]   Fax:  [phone]  Name: SACHEEN ARRASMITH MRN: [id number] Date of Birth: 11/28/1980

## 2018-07-05 ENCOUNTER — Ambulatory Visit (INDEPENDENT_AMBULATORY_CARE_PROVIDER_SITE_OTHER): Payer: 59 | Admitting: Physician Assistant

## 2018-07-05 VITALS — BP 101/70 | HR 97 | Temp 97.9°F | Ht 67.0 in | Wt >= 6400 oz

## 2018-07-05 DIAGNOSIS — Z6841 Body Mass Index (BMI) 40.0 and over, adult: Secondary | ICD-10-CM

## 2018-07-05 DIAGNOSIS — E559 Vitamin D deficiency, unspecified: Secondary | ICD-10-CM

## 2018-07-06 NOTE — Progress Notes (Signed)
Office: [phone]  /  Fax: [phone]   HPI:   Chief Complaint: OBESITY Catherine [name] is here to discuss her progress with her obesity treatment plan. She is on the Category 3 plan and is following her eating plan approximately 80 % of the time. She states she is sit down exercises for 20 minutes 3 times per week. Catherine [name] did very well with weight loss. She reports that she has started working out and she is enjoying it.  Her weight is (!) 412 lb (186.9 kg) today and has had a weight loss of 6 pounds over a period of 2 weeks since her last visit. She has lost 56 lbs since starting treatment with us.  Vitamin D Deficiency Catherine [name] has a diagnosis of vitamin D deficiency. She is on prescription Vit D, last level not at goal. She denies nausea, vomiting or muscle weakness.  ALLERGIES: Allergies  Allergen Reactions  . Citrus Swelling    MEDICATIONS: Current Outpatient Medications on File Prior to Visit  Medication Sig Dispense Refill  . aspirin-acetaminophen-caffeine (EXCEDRIN MIGRAINE) 250-250-65 MG per tablet Take 2 tablets by mouth 2 (two) times daily as needed for migraine. For headache     . budesonide (PULMICORT) 180 MCG/ACT inhaler Inhale 1 puff into the lungs 2 (two) times daily. 1 Inhaler 1  . buPROPion (WELLBUTRIN SR) 200 MG 12 hr tablet Take 1 tablet (200 mg total) by mouth daily at 12 noon. 30 tablet 0  . cetirizine (ZYRTEC) 10 MG tablet Take 10 mg by mouth daily.    . diclofenac (CATAFLAM) 50 MG tablet Take 50 mg by mouth 2 (two) times daily at 10 AM and 5 PM.    . diclofenac sodium (VOLTAREN) 1 % GEL Apply 2 g topically 4 (four) times daily. 100 g 1  . furosemide (LASIX) 20 MG tablet Take 1 tablet (20 mg total) by mouth 2 (two) times daily. (Patient taking differently: Take 20 mg by mouth daily. ) 60 tablet 3  . gabapentin (NEURONTIN) 300 MG capsule TAKE 2 CAPSULES (600 MG TOTAL) BY MOUTH 3 TIMES A DAY 180 capsule 3  . ibuprofen (ADVIL,MOTRIN) 200 MG tablet Take 600-800 mg  by mouth every 8 (eight) hours as needed (for pain.).     . Insulin Pen Needle (BD PEN NEEDLE NANO U/F) 32G X 4 MM MISC 1 Package by Does not apply route daily at 12 noon. 100 each 0  . Liraglutide -Weight Management (SAXENDA) 18 MG/3ML SOPN Inject 3 mg into the skin daily. 5 pen 0  . meloxicam (MOBIC) 15 MG tablet Take 1 tablet (15 mg total) by mouth daily. 30 tablet 0  . polyethylene glycol powder (GLYCOLAX/MIRALAX) powder Take 17 g by mouth daily as needed. 3350 g 1  . ranitidine (ZANTAC) 150 MG tablet Take 1 tablet (150 mg total) by mouth 2 (two) times daily. 60 tablet 0  . Vitamin D, Ergocalciferol, (DRISDOL) 50000 units CAPS capsule Take 1 capsule (50,000 Units total) by mouth every 7 (seven) days. 4 capsule 0   No current facility-administered medications on file prior to visit.     PAST MEDICAL HISTORY: Past Medical History:  Diagnosis Date  . Allergy   . Anxiety   . Arthritis   . Asthma    since baby, seasonal  . ASTHMA, UNSPECIFIED 12/10/2006   Qualifier: Diagnosis of  By: Abundio MiuMcGregor, Barbara    . CELLULITIS AND ABSCESS OF LEG EXCEPT FOOT 12/30/2007   Qualifier: Diagnosis of  By: Daphine DeutscherMartin FNP, Zena AmosNykedtra    .  Cyst of bone    right side of spine  . DEPENDENT EDEMA, LEGS, BILATERAL 11/22/2008   Qualifier: Diagnosis of  By: Barbaraann Barthel MD, [place]    . Depression 2003Dx  . Edema   . Food allergy    citris  . FOOT PAIN, RIGHT 11/22/2008   Qualifier: Diagnosis of  By: Barbaraann Barthel MD, [place]    . Gastric ulcer   . GERD (gastroesophageal reflux disease)   . Gout   . Hypertension    took Norvasc for 3 months, doctor discontinued no longer on BP medications  . Joint pain   . KNEE INJURY, LEFT 01/01/2009   Qualifier: Diagnosis of  By: Delrae Alfred MD, Lanora Manis    . Migraine   . ONYCHOMYCOSIS, TOENAILS 11/22/2008   Qualifier: Diagnosis of  By: Barbaraann Barthel MD, [place]    . Shortness of breath   . Sleep apnea   . TINEA PEDIS 11/22/2008   Qualifier: Diagnosis of  By: Barbaraann Barthel MD, Benetta Spar       PAST SURGICAL HISTORY: Past Surgical History:  Procedure Laterality Date  . DILITATION & CURRETTAGE/HYSTROSCOPY WITH NOVASURE ABLATION N/A 07/03/2017   Procedure: DILATATION & CURETTAGE/HYSTEROSCOPY WITH NOVASURE ABLATION;  Surgeon: Genia Del, MD;  Location: [facility];  Service: Gynecology;  Laterality: N/A;  . UPPER GI ENDOSCOPY    . WISDOM TOOTH EXTRACTION  2008   x4    SOCIAL HISTORY: Social History   Tobacco Use  . Smoking status: Former Smoker    Packs/day: 0.50    Years: 18.00    Pack years: 9.00    Types: Cigarettes    Start date: 10/13/1996    Last attempt to quit: 01/12/2015    Years since quitting: 3.4  . Smokeless tobacco: Never Used  Substance Use Topics  . Alcohol use: Yes    Alcohol/week: 1.0 standard drinks    Types: 1 Standard drinks or equivalent per week    Comment: rarely drinks  . Drug use: No    Comment: marjuana - former use, quit 4 years ago    FAMILY HISTORY: Family History  Problem Relation Age of Onset  . Hypertension Mother   . Hyperlipidemia Mother   . Cancer Mother        bone cancer  . Heart disease Mother   . Sudden death Mother   . Stroke Mother   . Thyroid disease Mother   . Sleep apnea Mother   . Obesity Mother   . Sudden death Father   . Hypertension Father   . Heart disease Father   . Hyperlipidemia Father   . Prostate cancer Father   . Lung cancer Father   . Obesity Father   . Alcoholism Father   . Sudden death Brother   . Hypertension Brother   . Hyperlipidemia Brother   . Heart attack Brother   . Heart disease Brother   . Stroke Brother   . Alcoholism Brother   . Drug abuse Brother   . Heart disease Sister   . Hypertension Sister   . Thyroid cancer Sister   . Diabetes Neg Hx   . Colon cancer Neg Hx   . Stomach cancer Neg Hx   . Esophageal cancer Neg Hx   . Rectal cancer Neg Hx   . Liver cancer Neg Hx     ROS: Review of Systems  Constitutional: Positive for weight loss.  Gastrointestinal: Negative  for nausea and vomiting.  Musculoskeletal:       Negative muscle weakness  PHYSICAL EXAM: Blood pressure 101/70, pulse 97, temperature 97.9 F (36.6 C), temperature source Oral, height 5\' 7"  (1.702 m), weight (!) 412 lb (186.9 kg), SpO2 98 %. Body mass index is 64.53 kg/m. Physical Exam  Constitutional: She is oriented to person, place, and time. She appears well-developed and well-nourished.  Cardiovascular: Normal rate.  Pulmonary/Chest: Effort normal.  Musculoskeletal: Normal range of motion.  Neurological: She is oriented to person, place, and time.  Skin: Skin is warm and dry.  Psychiatric: She has a normal mood and affect. Her behavior is normal.  Vitals reviewed.   RECENT LABS AND TESTS: BMET    Component Value Date/Time   NA 142 05/24/2018 1044   K 4.3 05/24/2018 1044   CL 109 (H) 05/24/2018 1044   CO2 15 (L) 05/24/2018 1044   GLUCOSE 79 05/24/2018 1044   GLUCOSE 83 06/04/2016 1155   BUN 17 05/24/2018 1044   CREATININE 0.88 05/24/2018 1044   CREATININE 0.90 06/04/2016 1155   CALCIUM 9.0 05/24/2018 1044   GFRNONAA 85 05/24/2018 1044   GFRNONAA 84 06/04/2016 1155   GFRAA 98 05/24/2018 1044   GFRAA >89 06/04/2016 1155   Lab Results  Component Value Date   HGBA1C 5.4 05/24/2018   HGBA1C 5.3 12/10/2017   HGBA1C 5.4 06/25/2017   HGBA1C 5.2 05/19/2017   HGBA1C 5.4 01/20/2017   Lab Results  Component Value Date   INSULIN 28.3 (H) 05/24/2018   INSULIN 43.6 (H) 12/10/2017   INSULIN 30.4 (H) 06/25/2017   INSULIN 29.7 (H) 01/20/2017   INSULIN 30.1 (H) 10/27/2016   CBC    Component Value Date/Time   WBC 10.6 12/10/2017 1242   WBC 13.3 (H) 07/03/2017 0610   RBC 4.64 12/10/2017 1242   RBC 4.33 07/03/2017 0610   HGB 12.5 12/10/2017 1242   HCT 38.4 12/10/2017 1242   PLT 293 07/03/2017 0610   PLT 343 05/18/2017 1611   MCV 83 12/10/2017 1242   MCH 26.9 12/10/2017 1242   MCH 25.9 (L) 07/03/2017 0610   MCHC 32.6 12/10/2017 1242   MCHC 32.4 07/03/2017 0610    RDW 16.2 (H) 12/10/2017 1242   LYMPHSABS 5.2 (H) 12/10/2017 1242   MONOABS 0.6 07/03/2017 0610   EOSABS 0.0 12/10/2017 1242   BASOSABS 0.0 12/10/2017 1242   Iron/TIBC/Ferritin/ %Sat    Component Value Date/Time   IRON 25 (L) 05/18/2017 1611   TIBC 363 05/18/2017 1611   FERRITIN 16 05/18/2017 1611   IRONPCTSAT 7 (LL) 05/18/2017 1611   Lipid Panel     Component Value Date/Time   CHOL 146 05/24/2018 1044   TRIG 53 05/24/2018 1044   HDL 41 05/24/2018 1044   CHOLHDL 3.6 11/21/2015 1047   VLDL 10 11/21/2015 1047   LDLCALC 94 05/24/2018 1044   Hepatic Function Panel     Component Value Date/Time   PROT 7.3 05/24/2018 1044   ALBUMIN 4.1 05/24/2018 1044   AST 29 05/24/2018 1044   ALT 24 05/24/2018 1044   ALKPHOS 70 05/24/2018 1044   BILITOT 0.3 05/24/2018 1044      Component Value Date/Time   TSH 1.43 05/19/2017 1245   TSH 1.460 10/27/2016 1058   TSH 0.96 11/21/2015 1047  Results for DALLANA, MAVITY (MRN [id number]) as of 07/06/2018 10:02  Ref. Range 05/24/2018 10:44  Vitamin D, 25-Hydroxy Latest Ref Range: 30.0 - 100.0 ng/mL 39.4    ASSESSMENT AND PLAN: Vitamin D deficiency  Class 3 severe obesity with serious comorbidity and body mass index (BMI) of  60.0 to 69.9 in adult, unspecified obesity type (HCC)  PLAN:  Vitamin D Deficiency Catherine [name] was informed that low vitamin D levels contributes to fatigue and are associated with obesity, breast, and colon cancer. Catherine [name] agrees to continue taking prescription Vit D @50 ,000 IU every week and will follow up for routine testing of vitamin D, at least 2-3 times per year. She was informed of the risk of over-replacement of vitamin D and agrees to not increase her dose unless she discusses this with [name] first. We will recheck levels in 2 months. Catherine [name] agrees to follow up with our clinic in 2 weeks.  I spent > than 50% of the 15 minute visit on counseling as documented in the note.  Obesity Catherine [name] is currently in the action  stage of change. As such, her goal is to continue with weight loss efforts She has agreed to follow the Category 3 plan Catherine [name] has been instructed to work up to a goal of 150 minutes of combined cardio and strengthening exercise per week for weight loss and overall health benefits. We discussed the following Behavioral Modification Strategies today: work on meal planning and easy cooking plans and planning for success   Catherine [name] has agreed to follow up with our clinic in 2 weeks. She was informed of the importance of frequent follow up visits to maximize her success with intensive lifestyle modifications for her multiple health conditions.   OBESITY BEHAVIORAL INTERVENTION VISIT  Today's visit was # 38  Starting weight: 468 lbs Starting date: 10/27/16 Today's weight : 412 lbs Today's date: 07/05/2018 Total lbs lost to date: 27    ASK: We discussed the diagnosis of obesity with Catherine [name] today and Catherine [name] agreed to give [name] permission to discuss obesity behavioral modification therapy today.  ASSESS: Catherine [name] has the diagnosis of obesity and her BMI today is 64.51 Catherine [name] is in the action stage of change   ADVISE: Catherine [name] was educated on the multiple health risks of obesity as well as the benefit of weight loss to improve her health. She was advised of the need for long term treatment and the importance of lifestyle modifications.  AGREE: Multiple dietary modification options and treatment options were discussed and  Catherine [name] agreed to the above obesity treatment plan.  Trude Mcburney, am acting as transcriptionist for Alois Cliche, PA-C I, Alois Cliche, PA-C have reviewed above note and agree with its content

## 2018-07-20 ENCOUNTER — Ambulatory Visit (INDEPENDENT_AMBULATORY_CARE_PROVIDER_SITE_OTHER): Payer: 59 | Admitting: Physician Assistant

## 2018-07-20 ENCOUNTER — Encounter (INDEPENDENT_AMBULATORY_CARE_PROVIDER_SITE_OTHER): Payer: Self-pay | Admitting: Physician Assistant

## 2018-07-20 VITALS — BP 92/59 | HR 66 | Temp 97.9°F | Ht 67.0 in | Wt >= 6400 oz

## 2018-07-20 DIAGNOSIS — Z6841 Body Mass Index (BMI) 40.0 and over, adult: Secondary | ICD-10-CM | POA: Diagnosis not present

## 2018-07-20 DIAGNOSIS — F3289 Other specified depressive episodes: Secondary | ICD-10-CM

## 2018-07-20 DIAGNOSIS — E559 Vitamin D deficiency, unspecified: Secondary | ICD-10-CM | POA: Diagnosis not present

## 2018-07-20 DIAGNOSIS — Z9189 Other specified personal risk factors, not elsewhere classified: Secondary | ICD-10-CM | POA: Diagnosis not present

## 2018-07-20 MED ORDER — LIRAGLUTIDE -WEIGHT MANAGEMENT 18 MG/3ML ~~LOC~~ SOPN: 3.0000 mg | PEN_INJECTOR | Freq: Every day | SUBCUTANEOUS | 0 refills | Status: DC

## 2018-07-20 MED ORDER — BUPROPION HCL ER (SR) 200 MG PO TB12: 200.0000 mg | ORAL_TABLET | Freq: Every day | ORAL | 0 refills | Status: DC

## 2018-07-20 MED FILL — SAXENDA 18 MG/3 ML PEN: 18 | 30 days supply | Qty: 15 | Fill #0

## 2018-07-20 MED FILL — BUPROPION HCL SR 200 MG TAB: 200 | 30 days supply | Qty: 30 | Fill #0

## 2018-07-21 ENCOUNTER — Other Ambulatory Visit: Payer: Self-pay

## 2018-07-21 DIAGNOSIS — R609 Edema, unspecified: Secondary | ICD-10-CM

## 2018-07-21 NOTE — Progress Notes (Signed)
Office: [phone]  /  Fax: [phone]   HPI:   Chief Complaint: OBESITY Catherine [name] is here to discuss her progress with her obesity treatment plan. She is on the Category 3 plan and is following her eating plan approximately 80 % of the time. She states she is doing leg exercises and weights for 20 minutes 3 times per week. Emrys struggled to follow the Category 3 plan due to stomach virus and food poisoning. She was unable to eat for a few days and had episodes of vomiting. She is beginning to feel better. She is traveling to [facility] this weekend.  Her weight is (!) 415 lb (188.2 kg) today and has gained 3 pounds since her last visit. She has lost 53 lbs since starting treatment with [name].  Vitamin D Deficiency Catherine [name] has a diagnosis of vitamin D deficiency. She is currently taking prescription Vit D and denies nausea, vomiting or muscle weakness.  At risk for osteopenia and osteoporosis Catherine [name] is at higher risk of osteopenia and osteoporosis due to vitamin D deficiency.   Depression with emotional eating behaviors Catherine [name] denies cravings and her blood pressure is normal. Capria struggles with emotional eating and using food for comfort to the extent that it is negatively impacting her health. She often snacks when she is not hungry. Catherine [name] sometimes feels she is out of control and then feels guilty that she made poor food choices. She has been working on behavior modification techniques to help reduce her emotional eating and has been somewhat successful. She shows no sign of suicidal or homicidal ideations.  Depression screen [facility] 2/9 01/18/2018 10/28/2017 07/14/2017 05/18/2017 03/24/2017  Decreased Interest 0 0 0 0 0  Down, Depressed, Hopeless 0 0 0 0 0  PHQ - 2 Score 0 0 0 0 0  Altered sleeping - - - - -  Tired, decreased energy - - - - -  Change in appetite - - - - -  Feeling bad or failure about yourself  - - - - -  Trouble concentrating - - - - -  Moving slowly or  fidgety/restless - - - - -  Suicidal thoughts - - - - -  PHQ-9 Score - - - - -  Some encounter information is confidential and restricted. Go to Review Flowsheets activity to see all data.    ALLERGIES: Allergies  Allergen Reactions  . Citrus Swelling    MEDICATIONS: Current Outpatient Medications on File Prior to Visit  Medication Sig Dispense Refill  . aspirin-acetaminophen-caffeine (EXCEDRIN MIGRAINE) 250-250-65 MG per tablet Take 2 tablets by mouth 2 (two) times daily as needed for migraine. For headache     . budesonide (PULMICORT) 180 MCG/ACT inhaler Inhale 1 puff into the lungs 2 (two) times daily. 1 Inhaler 1  . cetirizine (ZYRTEC) 10 MG tablet Take 10 mg by mouth daily.    . diclofenac (CATAFLAM) 50 MG tablet Take 50 mg by mouth 2 (two) times daily at 10 AM and 5 PM.    . diclofenac sodium (VOLTAREN) 1 % GEL Apply 2 g topically 4 (four) times daily. 100 g 1  . furosemide (LASIX) 20 MG tablet Take 1 tablet (20 mg total) by mouth 2 (two) times daily. (Patient taking differently: Take 20 mg by mouth daily. ) 60 tablet 3  . gabapentin (NEURONTIN) 300 MG capsule TAKE 2 CAPSULES (600 MG TOTAL) BY MOUTH 3 TIMES A DAY 180 capsule 3  . ibuprofen (ADVIL,MOTRIN) 200 MG tablet Take 600-800 mg by mouth  every 8 (eight) hours as needed (for pain.).     . Insulin Pen Needle (BD PEN NEEDLE NANO U/F) 32G X 4 MM MISC 1 Package by Does not apply route daily at 12 noon. 100 each 0  . meloxicam (MOBIC) 15 MG tablet Take 1 tablet (15 mg total) by mouth daily. 30 tablet 0  . polyethylene glycol powder (GLYCOLAX/MIRALAX) powder Take 17 g by mouth daily as needed. 3350 g 1  . ranitidine (ZANTAC) 150 MG tablet Take 1 tablet (150 mg total) by mouth 2 (two) times daily. 60 tablet 0  . Vitamin D, Ergocalciferol, (DRISDOL) 50000 units CAPS capsule Take 1 capsule (50,000 Units total) by mouth every 7 (seven) days. 4 capsule 0   No current facility-administered medications on file prior to visit.     PAST  MEDICAL HISTORY: Past Medical History:  Diagnosis Date  . Allergy   . Anxiety   . Arthritis   . Asthma    since baby, seasonal  . ASTHMA, UNSPECIFIED 12/10/2006   Qualifier: Diagnosis of  By: Abundio Miu    . CELLULITIS AND ABSCESS OF LEG EXCEPT FOOT 12/30/2007   Qualifier: Diagnosis of  By: Daphine Deutscher FNP, Zena Amos    . Cyst of bone    right side of spine  . DEPENDENT EDEMA, LEGS, BILATERAL 11/22/2008   Qualifier: Diagnosis of  By: Barbaraann Barthel MD, [place]    . Depression 2003Dx  . Edema   . Food allergy    citris  . FOOT PAIN, RIGHT 11/22/2008   Qualifier: Diagnosis of  By: Barbaraann Barthel MD, [place]    . Gastric ulcer   . GERD (gastroesophageal reflux disease)   . Gout   . Hypertension    took Norvasc for 3 months, doctor discontinued no longer on BP medications  . Joint pain   . KNEE INJURY, LEFT 01/01/2009   Qualifier: Diagnosis of  By: Delrae Alfred MD, Lanora Manis    . Migraine   . ONYCHOMYCOSIS, TOENAILS 11/22/2008   Qualifier: Diagnosis of  By: Barbaraann Barthel MD, [place]    . Shortness of breath   . Sleep apnea   . TINEA PEDIS 11/22/2008   Qualifier: Diagnosis of  By: Barbaraann Barthel MD, Benetta Spar      PAST SURGICAL HISTORY: Past Surgical History:  Procedure Laterality Date  . DILITATION & CURRETTAGE/HYSTROSCOPY WITH NOVASURE ABLATION N/A 07/03/2017   Procedure: DILATATION & CURETTAGE/HYSTEROSCOPY WITH NOVASURE ABLATION;  Surgeon: Genia Del, MD;  Location: [facility];  Service: Gynecology;  Laterality: N/A;  . UPPER GI ENDOSCOPY    . WISDOM TOOTH EXTRACTION  2008   x4    SOCIAL HISTORY: Social History   Tobacco Use  . Smoking status: Former Smoker    Packs/day: 0.50    Years: 18.00    Pack years: 9.00    Types: Cigarettes    Start date: 10/13/1996    Last attempt to quit: 01/12/2015    Years since quitting: 3.5  . Smokeless tobacco: Never Used  Substance Use Topics  . Alcohol use: Yes    Alcohol/week: 1.0 standard drinks    Types: 1 Standard drinks or equivalent per week     Comment: rarely drinks  . Drug use: No    Comment: marjuana - former use, quit 4 years ago    FAMILY HISTORY: Family History  Problem Relation Age of Onset  . Hypertension Mother   . Hyperlipidemia Mother   . Cancer Mother        bone cancer  . Heart disease Mother   .  Sudden death Mother   . Stroke Mother   . Thyroid disease Mother   . Sleep apnea Mother   . Obesity Mother   . Sudden death Father   . Hypertension Father   . Heart disease Father   . Hyperlipidemia Father   . Prostate cancer Father   . Lung cancer Father   . Obesity Father   . Alcoholism Father   . Sudden death Brother   . Hypertension Brother   . Hyperlipidemia Brother   . Heart attack Brother   . Heart disease Brother   . Stroke Brother   . Alcoholism Brother   . Drug abuse Brother   . Heart disease Sister   . Hypertension Sister   . Thyroid cancer Sister   . Diabetes Neg Hx   . Colon cancer Neg Hx   . Stomach cancer Neg Hx   . Esophageal cancer Neg Hx   . Rectal cancer Neg Hx   . Liver cancer Neg Hx     ROS: Review of Systems  Constitutional: Negative for weight loss.  Gastrointestinal: Negative for nausea and vomiting.  Musculoskeletal:       Negative muscle weakness  Psychiatric/Behavioral: Positive for depression. Negative for suicidal ideas.    PHYSICAL EXAM: Blood pressure (!) 92/59, pulse 66, temperature 97.9 F (36.6 C), temperature source Oral, height 5\' 7"  (1.702 m), weight (!) 415 lb (188.2 kg), SpO2 97 %. Body mass index is 65 kg/m. Physical Exam  Constitutional: She is oriented to person, place, and time. She appears well-developed and well-nourished.  Cardiovascular: Normal rate.  Pulmonary/Chest: Effort normal.  Musculoskeletal: Normal range of motion.  Neurological: She is oriented to person, place, and time.  Skin: Skin is warm and dry.  Psychiatric: She has a normal mood and affect. Her behavior is normal.  Vitals reviewed.   RECENT LABS AND TESTS: BMET      Component Value Date/Time   NA 142 05/24/2018 1044   K 4.3 05/24/2018 1044   CL 109 (H) 05/24/2018 1044   CO2 15 (L) 05/24/2018 1044   GLUCOSE 79 05/24/2018 1044   GLUCOSE 83 06/04/2016 1155   BUN 17 05/24/2018 1044   CREATININE 0.88 05/24/2018 1044   CREATININE 0.90 06/04/2016 1155   CALCIUM 9.0 05/24/2018 1044   GFRNONAA 85 05/24/2018 1044   GFRNONAA 84 06/04/2016 1155   GFRAA 98 05/24/2018 1044   GFRAA >89 06/04/2016 1155   Lab Results  Component Value Date   HGBA1C 5.4 05/24/2018   HGBA1C 5.3 12/10/2017   HGBA1C 5.4 06/25/2017   HGBA1C 5.2 05/19/2017   HGBA1C 5.4 01/20/2017   Lab Results  Component Value Date   INSULIN 28.3 (H) 05/24/2018   INSULIN 43.6 (H) 12/10/2017   INSULIN 30.4 (H) 06/25/2017   INSULIN 29.7 (H) 01/20/2017   INSULIN 30.1 (H) 10/27/2016   CBC    Component Value Date/Time   WBC 10.6 12/10/2017 1242   WBC 13.3 (H) 07/03/2017 0610   RBC 4.64 12/10/2017 1242   RBC 4.33 07/03/2017 0610   HGB 12.5 12/10/2017 1242   HCT 38.4 12/10/2017 1242   PLT 293 07/03/2017 0610   PLT 343 05/18/2017 1611   MCV 83 12/10/2017 1242   MCH 26.9 12/10/2017 1242   MCH 25.9 (L) 07/03/2017 0610   MCHC 32.6 12/10/2017 1242   MCHC 32.4 07/03/2017 0610   RDW 16.2 (H) 12/10/2017 1242   LYMPHSABS 5.2 (H) 12/10/2017 1242   MONOABS 0.6 07/03/2017 0610   EOSABS 0.0  12/10/2017 1242   BASOSABS 0.0 12/10/2017 1242   Iron/TIBC/Ferritin/ %Sat    Component Value Date/Time   IRON 25 (L) 05/18/2017 1611   TIBC 363 05/18/2017 1611   FERRITIN 16 05/18/2017 1611   IRONPCTSAT 7 (LL) 05/18/2017 1611   Lipid Panel     Component Value Date/Time   CHOL 146 05/24/2018 1044   TRIG 53 05/24/2018 1044   HDL 41 05/24/2018 1044   CHOLHDL 3.6 11/21/2015 1047   VLDL 10 11/21/2015 1047   LDLCALC 94 05/24/2018 1044   Hepatic Function Panel     Component Value Date/Time   PROT 7.3 05/24/2018 1044   ALBUMIN 4.1 05/24/2018 1044   AST 29 05/24/2018 1044   ALT 24 05/24/2018 1044    ALKPHOS 70 05/24/2018 1044   BILITOT 0.3 05/24/2018 1044      Component Value Date/Time   TSH 1.43 05/19/2017 1245   TSH 1.460 10/27/2016 1058   TSH 0.96 11/21/2015 1047  Results for CIMBERLY, STOFFEL (MRN [id number]) as of 07/21/2018 15:34  Ref. Range 05/24/2018 10:44  Vitamin D, 25-Hydroxy Latest Ref Range: 30.0 - 100.0 ng/mL 39.4    ASSESSMENT AND PLAN: Vitamin D deficiency  Other depression - with emotional eating - Plan: buPROPion (WELLBUTRIN SR) 200 MG 12 hr tablet  At risk for osteoporosis  Class 3 severe obesity with serious comorbidity and body mass index (BMI) of 60.0 to 69.9 in adult, unspecified obesity type (HCC) - Plan: Liraglutide -Weight Management (SAXENDA) 18 MG/3ML SOPN  PLAN:  Vitamin D Deficiency Mikal was informed that low vitamin D levels contributes to fatigue and are associated with obesity, breast, and colon cancer. Aviona agrees to continue taking prescription Vit D @50 ,000 IU every week and will follow up for routine testing of vitamin D, at least 2-3 times per year. She was informed of the risk of over-replacement of vitamin D and agrees to not increase her dose unless she discusses this with [name] first. Tinsley agrees to follow up with our clinic in 2 weeks.  At risk for osteopenia and osteoporosis Lyndsie was given extended (15 minutes) osteoporosis prevention counseling today. Iylah is at risk for osteopenia and osteoporsis due to her vitamin D deficiency. She was encouraged to take her vitamin D and follow her higher calcium diet and increase strengthening exercise to help strengthen her bones and decrease her risk of osteopenia and osteoporosis.  Depression with Emotional Eating Behaviors We discussed behavior modification techniques today to help Cinde deal with her emotional eating and depression. Jullia agrees to continue taking Wellbutrin SR 200 mg qd #30 and we will refill for 1 month. Imari agrees to follow up with our clinic in 2  weeks.  Obesity Jolinda is currently in the action stage of change. As such, her goal is to continue with weight loss efforts She has agreed to follow the Category 3 plan Ainslie has been instructed to work up to a goal of 150 minutes of combined cardio and strengthening exercise per week for weight loss and overall health benefits. We discussed the following Behavioral Modification Strategies today: work on meal planning and easy cooking plans and travel eating strategies  We discussed various medication options to help Ramina with her weight loss efforts and we both agreed to continue Saxenda 3 mg SubQ qd #5 pens and we will refill for 1 month.   Yalexa has agreed to follow up with our clinic in 2 weeks. She was informed of the importance of frequent follow up visits  to maximize her success with intensive lifestyle modifications for her multiple health conditions.   OBESITY BEHAVIORAL INTERVENTION VISIT  Today's visit was # 39   Starting weight: 468 lbs Starting date: 10/27/16 Today's weight : 415 lbs Today's date: 07/20/2018 Total lbs lost to date: 75    ASK: We discussed the diagnosis of obesity with [name] today and Remonia agreed to give [name] permission to discuss obesity behavioral modification therapy today.  ASSESS: Nekia has the diagnosis of obesity and her BMI today is 64.98 Jerry is in the action stage of change   ADVISE: Georgeann was educated on the multiple health risks of obesity as well as the benefit of weight loss to improve her health. She was advised of the need for long term treatment and the importance of lifestyle modifications.  AGREE: Multiple dietary modification options and treatment options were discussed and  Nyisha agreed to the above obesity treatment plan.  Trude Mcburney, am acting as transcriptionist for Alois Cliche, PA-C I, Alois Cliche, PA-C have reviewed above note and agree with its content

## 2018-07-22 ENCOUNTER — Other Ambulatory Visit: Payer: Self-pay | Admitting: Internal Medicine

## 2018-07-22 ENCOUNTER — Telehealth: Payer: Self-pay

## 2018-07-22 MED ORDER — FUROSEMIDE 20 MG PO TABS: 20.0000 mg | ORAL_TABLET | Freq: Two times a day (BID) | ORAL | 3 refills | Status: DC

## 2018-07-22 MED FILL — FUROSEMIDE 20 MG TABS: 20 | 30 days supply | Qty: 60 | Fill #0

## 2018-07-22 NOTE — Telephone Encounter (Signed)
Called patient and informed her of RX for Lasix being filled. Asked her to call back to schedule and appointment as she has not been seen in the clinic in a while per Dr. Karen Chafe.  Glennie Hawk, CMA

## 2018-07-22 NOTE — Telephone Encounter (Signed)
-----   Message from Arlyce Harman, DO sent at 07/22/2018  9:52 AM EDT ----- Regarding: Patient Visit I have refilled patient's Lasix but saw she has not been since in our clinic in awhile. Can we please call her and let her know I would like her to come in. Thanks!  Tim

## 2018-08-01 ENCOUNTER — Other Ambulatory Visit (INDEPENDENT_AMBULATORY_CARE_PROVIDER_SITE_OTHER): Payer: Self-pay | Admitting: Physician Assistant

## 2018-08-01 ENCOUNTER — Other Ambulatory Visit: Payer: Self-pay | Admitting: Family Medicine

## 2018-08-01 DIAGNOSIS — Z6841 Body Mass Index (BMI) 40.0 and over, adult: Principal | ICD-10-CM

## 2018-08-02 ENCOUNTER — Other Ambulatory Visit: Payer: Self-pay | Admitting: Family Medicine

## 2018-08-02 ENCOUNTER — Other Ambulatory Visit (INDEPENDENT_AMBULATORY_CARE_PROVIDER_SITE_OTHER): Payer: Self-pay | Admitting: Physician Assistant

## 2018-08-02 ENCOUNTER — Ambulatory Visit (INDEPENDENT_AMBULATORY_CARE_PROVIDER_SITE_OTHER): Payer: 59 | Admitting: Physician Assistant

## 2018-08-02 DIAGNOSIS — Z6841 Body Mass Index (BMI) 40.0 and over, adult: Principal | ICD-10-CM

## 2018-08-03 MED FILL — raNITIdine HCL 150 MG TABS: 150 | 30 days supply | Qty: 60 | Fill #0

## 2018-08-04 ENCOUNTER — Ambulatory Visit (INDEPENDENT_AMBULATORY_CARE_PROVIDER_SITE_OTHER): Payer: 59 | Admitting: Physician Assistant

## 2018-08-04 VITALS — BP 106/56 | HR 103 | Temp 98.1°F | Ht 67.0 in | Wt >= 6400 oz

## 2018-08-04 DIAGNOSIS — F3289 Other specified depressive episodes: Secondary | ICD-10-CM | POA: Diagnosis not present

## 2018-08-04 DIAGNOSIS — E559 Vitamin D deficiency, unspecified: Secondary | ICD-10-CM

## 2018-08-04 DIAGNOSIS — Z9189 Other specified personal risk factors, not elsewhere classified: Secondary | ICD-10-CM

## 2018-08-04 DIAGNOSIS — Z6841 Body Mass Index (BMI) 40.0 and over, adult: Secondary | ICD-10-CM

## 2018-08-04 MED ORDER — INSULIN PEN NEEDLE 32G X 4 MM MISC: 1.0000 | Freq: Every day | 0 refills | Status: DC

## 2018-08-05 MED FILL — UNIFINE PENTIPS 32GX5/32: 32G X 4 MM | 90 days supply | Qty: 100 | Fill #0

## 2018-08-07 NOTE — Progress Notes (Signed)
Office: [phone]  /  Fax: [phone]   HPI:   Chief Complaint: OBESITY Catherine [name] is here to discuss her progress with her obesity treatment plan. She is on the Category 3 plan and is following her eating plan approximately 75 % of the time. She states she is doing water aerobics for 15 minutes 1-3 times per week. Catherine [name] did well with weight loss. She reports that she continues to struggle with decreased appetite due to recent GI virus.  Her weight is (!) 413 lb (187.3 kg) today and has had a weight loss of 2 pounds over a period of 2 weeks since her last visit. She has lost 55 lbs since starting treatment with [name].  Vitamin D Deficiency Catherine [name] has a diagnosis of vitamin D deficiency. She is on prescription Vit D and denies nausea, vomiting or muscle weakness.  At risk for osteopenia and osteoporosis Catherine [name] is at higher risk of osteopenia and osteoporosis due to vitamin D deficiency.   Depression with emotional eating behaviors Catherine [name] notes no cravings and her blood pressure is normal. Catherine [name] struggles with emotional eating and using food for comfort to the extent that it is negatively impacting her health. She often snacks when she is not hungry. Catherine [name] sometimes feels she is out of control and then feels guilty that she made poor food choices. She has been working on behavior modification techniques to help reduce her emotional eating and has been somewhat successful. She shows no sign of suicidal or homicidal ideations.  Depression screen Catherine [name] 2/9 01/18/2018 10/28/2017 07/14/2017 05/18/2017 03/24/2017  Decreased Interest 0 0 0 0 0  Down, Depressed, Hopeless 0 0 0 0 0  PHQ - 2 Score 0 0 0 0 0  Altered sleeping - - - - -  Tired, decreased energy - - - - -  Change in appetite - - - - -  Feeling bad or failure about yourself  - - - - -  Trouble concentrating - - - - -  Moving slowly or fidgety/restless - - - - -  Suicidal thoughts - - - - -  PHQ-9 Score - - - - -  Some encounter  information is confidential and restricted. Go to Review Flowsheets activity to see all data.  Some recent data might be hidden    ALLERGIES: Allergies  Allergen Reactions  . Citrus Swelling    MEDICATIONS: Current Outpatient Medications on File Prior to Visit  Medication Sig Dispense Refill  . aspirin-acetaminophen-caffeine (EXCEDRIN MIGRAINE) 250-250-65 MG per tablet Take 2 tablets by mouth 2 (two) times daily as needed for migraine. For headache     . budesonide (PULMICORT) 180 MCG/ACT inhaler Inhale 1 puff into the lungs 2 (two) times daily. 1 Inhaler 1  . buPROPion (WELLBUTRIN SR) 200 MG 12 hr tablet Take 1 tablet (200 mg total) by mouth daily at 12 noon. 30 tablet 0  . cetirizine (ZYRTEC) 10 MG tablet Take 10 mg by mouth daily.    . diclofenac (CATAFLAM) 50 MG tablet Take 50 mg by mouth 2 (two) times daily at 10 AM and 5 PM.    . diclofenac sodium (VOLTAREN) 1 % GEL Apply 2 g topically 4 (four) times daily. 100 g 1  . furosemide (LASIX) 20 MG tablet Take 1 tablet (20 mg total) by mouth 2 (two) times daily. 60 tablet 3  . furosemide (LASIX) 20 MG tablet TAKE 1 TABLET BY MOUTH DAILY. 30 tablet 1  . gabapentin (NEURONTIN) 300 MG capsule TAKE 2 CAPSULES (  600 MG TOTAL) BY MOUTH 3 TIMES A DAY 180 capsule 3  . ibuprofen (ADVIL,MOTRIN) 200 MG tablet Take 600-800 mg by mouth every 8 (eight) hours as needed (for pain.).     Marland Kitchen Liraglutide -Weight Management (SAXENDA) 18 MG/3ML SOPN Inject 3 mg into the skin daily. 5 pen 0  . meloxicam (MOBIC) 15 MG tablet Take 1 tablet (15 mg total) by mouth daily. 30 tablet 0  . polyethylene glycol powder (GLYCOLAX/MIRALAX) powder Take 17 g by mouth daily as needed. 3350 g 1  . ranitidine (ZANTAC) 150 MG tablet TAKE 1 TABLET (150 MG TOTAL) BY MOUTH 2 TIMES DAILY. 60 tablet 0  . Vitamin D, Ergocalciferol, (DRISDOL) 50000 units CAPS capsule Take 1 capsule (50,000 Units total) by mouth every 7 (seven) days. 4 capsule 0   No current facility-administered  medications on file prior to visit.     PAST MEDICAL HISTORY: Past Medical History:  Diagnosis Date  . Allergy   . Anxiety   . Arthritis   . Asthma    since baby, seasonal  . ASTHMA, UNSPECIFIED 12/10/2006   Qualifier: Diagnosis of  By: Abundio Miu    . CELLULITIS AND ABSCESS OF LEG EXCEPT FOOT 12/30/2007   Qualifier: Diagnosis of  By: Daphine Deutscher FNP, Zena Amos    . Cyst of bone    right side of spine  . DEPENDENT EDEMA, LEGS, BILATERAL 11/22/2008   Qualifier: Diagnosis of  By: Barbaraann Barthel MD, [place]    . Depression 2003Dx  . Edema   . Food allergy    citris  . FOOT PAIN, RIGHT 11/22/2008   Qualifier: Diagnosis of  By: Barbaraann Barthel MD, [place]    . Gastric ulcer   . GERD (gastroesophageal reflux disease)   . Gout   . Hypertension    took Norvasc for 3 months, doctor discontinued no longer on BP medications  . Joint pain   . KNEE INJURY, LEFT 01/01/2009   Qualifier: Diagnosis of  By: Delrae Alfred MD, Lanora Manis    . Migraine   . ONYCHOMYCOSIS, TOENAILS 11/22/2008   Qualifier: Diagnosis of  By: Barbaraann Barthel MD, [place]    . Shortness of breath   . Sleep apnea   . TINEA PEDIS 11/22/2008   Qualifier: Diagnosis of  By: Barbaraann Barthel MD, Benetta Spar      PAST SURGICAL HISTORY: Past Surgical History:  Procedure Laterality Date  . DILITATION & CURRETTAGE/HYSTROSCOPY WITH NOVASURE ABLATION N/A 07/03/2017   Procedure: DILATATION & CURETTAGE/HYSTEROSCOPY WITH NOVASURE ABLATION;  Surgeon: Genia Del, MD;  Location: [facility];  Service: Gynecology;  Laterality: N/A;  . UPPER GI ENDOSCOPY    . WISDOM TOOTH EXTRACTION  2008   x4    SOCIAL HISTORY: Social History   Tobacco Use  . Smoking status: Former Smoker    Packs/day: 0.50    Years: 18.00    Pack years: 9.00    Types: Cigarettes    Start date: 10/13/1996    Last attempt to quit: 01/12/2015    Years since quitting: 3.5  . Smokeless tobacco: Never Used  Substance Use Topics  . Alcohol use: Yes    Alcohol/week: 1.0 standard drinks    Types: 1  Standard drinks or equivalent per week    Comment: rarely drinks  . Drug use: No    Comment: marjuana - former use, quit 4 years ago    FAMILY HISTORY: Family History  Problem Relation Age of Onset  . Hypertension Mother   . Hyperlipidemia Mother   . Cancer Mother  bone cancer  . Heart disease Mother   . Sudden death Mother   . Stroke Mother   . Thyroid disease Mother   . Sleep apnea Mother   . Obesity Mother   . Sudden death Father   . Hypertension Father   . Heart disease Father   . Hyperlipidemia Father   . Prostate cancer Father   . Lung cancer Father   . Obesity Father   . Alcoholism Father   . Sudden death Brother   . Hypertension Brother   . Hyperlipidemia Brother   . Heart attack Brother   . Heart disease Brother   . Stroke Brother   . Alcoholism Brother   . Drug abuse Brother   . Heart disease Sister   . Hypertension Sister   . Thyroid cancer Sister   . Diabetes Neg Hx   . Colon cancer Neg Hx   . Stomach cancer Neg Hx   . Esophageal cancer Neg Hx   . Rectal cancer Neg Hx   . Liver cancer Neg Hx     ROS: Review of Systems  Constitutional: Positive for weight loss.  Gastrointestinal: Negative for nausea and vomiting.  Musculoskeletal:       Negative muscle weakness  Psychiatric/Behavioral: Positive for depression. Negative for suicidal ideas.    PHYSICAL EXAM: Blood pressure (!) 106/56, pulse (!) 103, temperature 98.1 F (36.7 C), temperature source Oral, height 5\' 7"  (1.702 m), weight (!) 413 lb (187.3 kg), SpO2 100 %. Body mass index is 64.68 kg/m. Physical Exam  Constitutional: She is oriented to person, place, and time. She appears well-developed and well-nourished.  Cardiovascular: Normal rate.  Pulmonary/Chest: Effort normal.  Musculoskeletal: Normal range of motion.  Neurological: She is oriented to person, place, and time.  Skin: Skin is warm and dry.  Psychiatric: She has a normal mood and affect. Her behavior is normal.    Vitals reviewed.   RECENT LABS AND TESTS: BMET    Component Value Date/Time   NA 142 05/24/2018 1044   K 4.3 05/24/2018 1044   CL 109 (H) 05/24/2018 1044   CO2 15 (L) 05/24/2018 1044   GLUCOSE 79 05/24/2018 1044   GLUCOSE 83 06/04/2016 1155   BUN 17 05/24/2018 1044   CREATININE 0.88 05/24/2018 1044   CREATININE 0.90 06/04/2016 1155   CALCIUM 9.0 05/24/2018 1044   GFRNONAA 85 05/24/2018 1044   GFRNONAA 84 06/04/2016 1155   GFRAA 98 05/24/2018 1044   GFRAA >89 06/04/2016 1155   Lab Results  Component Value Date   HGBA1C 5.4 05/24/2018   HGBA1C 5.3 12/10/2017   HGBA1C 5.4 06/25/2017   HGBA1C 5.2 05/19/2017   HGBA1C 5.4 01/20/2017   Lab Results  Component Value Date   INSULIN 28.3 (H) 05/24/2018   INSULIN 43.6 (H) 12/10/2017   INSULIN 30.4 (H) 06/25/2017   INSULIN 29.7 (H) 01/20/2017   INSULIN 30.1 (H) 10/27/2016   CBC    Component Value Date/Time   WBC 10.6 12/10/2017 1242   WBC 13.3 (H) 07/03/2017 0610   RBC 4.64 12/10/2017 1242   RBC 4.33 07/03/2017 0610   HGB 12.5 12/10/2017 1242   HCT 38.4 12/10/2017 1242   PLT 293 07/03/2017 0610   PLT 343 05/18/2017 1611   MCV 83 12/10/2017 1242   MCH 26.9 12/10/2017 1242   MCH 25.9 (L) 07/03/2017 0610   MCHC 32.6 12/10/2017 1242   MCHC 32.4 07/03/2017 0610   RDW 16.2 (H) 12/10/2017 1242   LYMPHSABS 5.2 (H) 12/10/2017  1242   MONOABS 0.6 07/03/2017 0610   EOSABS 0.0 12/10/2017 1242   BASOSABS 0.0 12/10/2017 1242   Iron/TIBC/Ferritin/ %Sat    Component Value Date/Time   IRON 25 (L) 05/18/2017 1611   TIBC 363 05/18/2017 1611   FERRITIN 16 05/18/2017 1611   IRONPCTSAT 7 (LL) 05/18/2017 1611   Lipid Panel     Component Value Date/Time   CHOL 146 05/24/2018 1044   TRIG 53 05/24/2018 1044   HDL 41 05/24/2018 1044   CHOLHDL 3.6 11/21/2015 1047   VLDL 10 11/21/2015 1047   LDLCALC 94 05/24/2018 1044   Hepatic Function Panel     Component Value Date/Time   PROT 7.3 05/24/2018 1044   ALBUMIN 4.1 05/24/2018  1044   AST 29 05/24/2018 1044   ALT 24 05/24/2018 1044   ALKPHOS 70 05/24/2018 1044   BILITOT 0.3 05/24/2018 1044      Component Value Date/Time   TSH 1.43 05/19/2017 1245   TSH 1.460 10/27/2016 1058   TSH 0.96 11/21/2015 1047  Results for CASANDRA, DALLAIRE (MRN [id number]) as of 08/07/2018 16:08  Ref. Range 05/24/2018 10:44  Vitamin D, 25-Hydroxy Latest Ref Range: 30.0 - 100.0 ng/mL 39.4    ASSESSMENT AND PLAN: Vitamin D deficiency  Other depression - with emotional eating  At risk for osteoporosis  Class 3 severe obesity with serious comorbidity and body mass index (BMI) of 60.0 to 69.9 in adult, unspecified obesity type (HCC) - Plan: Insulin Pen Needle (BD PEN NEEDLE NANO U/F) 32G X 4 MM MISC  PLAN:  Vitamin D Deficiency Catherine [name] was informed that low vitamin D levels contributes to fatigue and are associated with obesity, breast, and colon cancer. Catherine [name] agrees to continue taking prescription Vit D @50 ,000 IU every week and will follow up for routine testing of vitamin D, at least 2-3 times per year. She was informed of the risk of over-replacement of vitamin D and agrees to not increase her dose unless she discusses this with [name] first. Lilyanna agrees to follow up with our clinic in 2 weeks.  At risk for osteopenia and osteoporosis Catherine [name] was given extended (15 minutes) osteoporosis prevention counseling today. Catherine [name] is at risk for osteopenia and osteoporsis due to her vitamin D deficiency. She was encouraged to take her vitamin D and follow her higher calcium diet and increase strengthening exercise to help strengthen her bones and decrease her risk of osteopenia and osteoporosis.  Depression with Emotional Eating Behaviors We discussed behavior modification techniques today to help Catherine [name] deal with her emotional eating and depression. Catherine [name] agrees to continue taking Wellbutrin SR 200 mg qd and she agrees to follow up with our clinic in 2 weeks.  Obesity Catherine [name] is  currently in the action stage of change. As such, her goal is to continue with weight loss efforts She has agreed to follow the Category 3 plan Catherine [name] has been instructed to work up to a goal of 150 minutes of combined cardio and strengthening exercise per week for weight loss and overall health benefits. We discussed the following Behavioral Modification Strategies today: increasing lean protein intake and no skipping meals We will refill insulin needles #100 with no refills.  Catherine [name] has agreed to follow up with our clinic in 2 weeks. She was informed of the importance of frequent follow up visits to maximize her success with intensive lifestyle modifications for her multiple health conditions.   OBESITY BEHAVIORAL INTERVENTION VISIT  Today's visit was # 40   Starting weight: 468  lbs Starting date: 10/27/16 Today's weight : 413 lbs  Today's date: 08/04/2018 Total lbs lost to date: 70    ASK: We discussed the diagnosis of obesity with Catherine [name] today and Catherine [name] agreed to give [name] permission to discuss obesity behavioral modification therapy today.  ASSESS: Catherine [name] has the diagnosis of obesity and her BMI today is 64.67 Catherine [name] is in the action stage of change   ADVISE: Catherine [name] was educated on the multiple health risks of obesity as well as the benefit of weight loss to improve her health. She was advised of the need for long term treatment and the importance of lifestyle modifications.  AGREE: Multiple dietary modification options and treatment options were discussed and  Catherine [name] agreed to the above obesity treatment plan.  Catherine [name], am acting as transcriptionist for Alois Cliche, PA-C I, Alois Cliche, PA-C have reviewed above note and agree with its content

## 2018-08-17 ENCOUNTER — Encounter (INDEPENDENT_AMBULATORY_CARE_PROVIDER_SITE_OTHER): Payer: Self-pay | Admitting: Physician Assistant

## 2018-08-17 ENCOUNTER — Ambulatory Visit (INDEPENDENT_AMBULATORY_CARE_PROVIDER_SITE_OTHER): Payer: 59 | Admitting: Physician Assistant

## 2018-08-17 VITALS — BP 96/68 | HR 76 | Temp 97.5°F | Ht 67.0 in | Wt >= 6400 oz

## 2018-08-17 DIAGNOSIS — E559 Vitamin D deficiency, unspecified: Secondary | ICD-10-CM

## 2018-08-17 DIAGNOSIS — Z6841 Body Mass Index (BMI) 40.0 and over, adult: Secondary | ICD-10-CM | POA: Diagnosis not present

## 2018-08-18 NOTE — Progress Notes (Signed)
Office: [phone]  /  Fax: [phone]   HPI:   Chief Complaint: OBESITY Catherine [name] is here to discuss her progress with her obesity treatment plan. She is on the Category 3 plan and is following her eating plan approximately 80 % of the time. She states she is doing leg press and on the treadmill for 15-20 minutes 7 times per week. Catherine [name] reports that she finally is feeling better from her stomach issue over the last few days. She has just started eating back on the plan.  Her weight is (!) 420 lb (190.5 kg) today and has gained 7 pounds since her last visit. She has lost 48 lbs since starting treatment with [name].  Vitamin D Deficiency Catherine [name] has a diagnosis of vitamin D deficiency. She is currently taking prescription Vit D and denies nausea, vomiting or muscle weakness.  ALLERGIES: Allergies  Allergen Reactions  . Citrus Swelling    MEDICATIONS: Current Outpatient Medications on File Prior to Visit  Medication Sig Dispense Refill  . aspirin-acetaminophen-caffeine (EXCEDRIN MIGRAINE) 250-250-65 MG per tablet Take 2 tablets by mouth 2 (two) times daily as needed for migraine. For headache     . budesonide (PULMICORT) 180 MCG/ACT inhaler Inhale 1 puff into the lungs 2 (two) times daily. 1 Inhaler 1  . buPROPion (WELLBUTRIN SR) 200 MG 12 hr tablet Take 1 tablet (200 mg total) by mouth daily at 12 noon. 30 tablet 0  . cetirizine (ZYRTEC) 10 MG tablet Take 10 mg by mouth daily.    . diclofenac (CATAFLAM) 50 MG tablet Take 50 mg by mouth 2 (two) times daily at 10 AM and 5 PM.    . diclofenac sodium (VOLTAREN) 1 % GEL Apply 2 g topically 4 (four) times daily. 100 g 1  . furosemide (LASIX) 20 MG tablet Take 1 tablet (20 mg total) by mouth 2 (two) times daily. 60 tablet 3  . furosemide (LASIX) 20 MG tablet TAKE 1 TABLET BY MOUTH DAILY. 30 tablet 1  . gabapentin (NEURONTIN) 300 MG capsule TAKE 2 CAPSULES (600 MG TOTAL) BY MOUTH 3 TIMES A DAY 180 capsule 3  . ibuprofen (ADVIL,MOTRIN) 200  MG tablet Take 600-800 mg by mouth every 8 (eight) hours as needed (for pain.).     . Insulin Pen Needle (BD PEN NEEDLE NANO U/F) 32G X 4 MM MISC 1 Package by Does not apply route daily at 12 noon. 100 each 0  . Liraglutide -Weight Management (SAXENDA) 18 MG/3ML SOPN Inject 3 mg into the skin daily. 5 pen 0  . meloxicam (MOBIC) 15 MG tablet Take 1 tablet (15 mg total) by mouth daily. 30 tablet 0  . polyethylene glycol powder (GLYCOLAX/MIRALAX) powder Take 17 g by mouth daily as needed. 3350 g 1  . ranitidine (ZANTAC) 150 MG tablet TAKE 1 TABLET (150 MG TOTAL) BY MOUTH 2 TIMES DAILY. 60 tablet 0  . Vitamin D, Ergocalciferol, (DRISDOL) 50000 units CAPS capsule Take 1 capsule (50,000 Units total) by mouth every 7 (seven) days. 4 capsule 0   No current facility-administered medications on file prior to visit.     PAST MEDICAL HISTORY: Past Medical History:  Diagnosis Date  . Allergy   . Anxiety   . Arthritis   . Asthma    since baby, seasonal  . ASTHMA, UNSPECIFIED 12/10/2006   Qualifier: Diagnosis of  By: Abundio Miu    . CELLULITIS AND ABSCESS OF LEG EXCEPT FOOT 12/30/2007   Qualifier: Diagnosis of  By: Daphine Deutscher FNP, Zena Amos    .  Cyst of bone    right side of spine  . DEPENDENT EDEMA, LEGS, BILATERAL 11/22/2008   Qualifier: Diagnosis of  By: Barbaraann Barthel MD, [place]    . Depression 2003Dx  . Edema   . Food allergy    citris  . FOOT PAIN, RIGHT 11/22/2008   Qualifier: Diagnosis of  By: Barbaraann Barthel MD, [place]    . Gastric ulcer   . GERD (gastroesophageal reflux disease)   . Gout   . Hypertension    took Norvasc for 3 months, doctor discontinued no longer on BP medications  . Joint pain   . KNEE INJURY, LEFT 01/01/2009   Qualifier: Diagnosis of  By: Delrae Alfred MD, Lanora Manis    . Migraine   . ONYCHOMYCOSIS, TOENAILS 11/22/2008   Qualifier: Diagnosis of  By: Barbaraann Barthel MD, [place]    . Shortness of breath   . Sleep apnea   . TINEA PEDIS 11/22/2008   Qualifier: Diagnosis of  By: Barbaraann Barthel  MD, Benetta Spar      PAST SURGICAL HISTORY: Past Surgical History:  Procedure Laterality Date  . DILITATION & CURRETTAGE/HYSTROSCOPY WITH NOVASURE ABLATION N/A 07/03/2017   Procedure: DILATATION & CURETTAGE/HYSTEROSCOPY WITH NOVASURE ABLATION;  Surgeon: Genia Del, MD;  Location: [facility];  Service: Gynecology;  Laterality: N/A;  . UPPER GI ENDOSCOPY    . WISDOM TOOTH EXTRACTION  2008   x4    SOCIAL HISTORY: Social History   Tobacco Use  . Smoking status: Former Smoker    Packs/day: 0.50    Years: 18.00    Pack years: 9.00    Types: Cigarettes    Start date: 10/13/1996    Last attempt to quit: 01/12/2015    Years since quitting: 3.6  . Smokeless tobacco: Never Used  Substance Use Topics  . Alcohol use: Yes    Alcohol/week: 1.0 standard drinks    Types: 1 Standard drinks or equivalent per week    Comment: rarely drinks  . Drug use: No    Comment: marjuana - former use, quit 4 years ago    FAMILY HISTORY: Family History  Problem Relation Age of Onset  . Hypertension Mother   . Hyperlipidemia Mother   . Cancer Mother        bone cancer  . Heart disease Mother   . Sudden death Mother   . Stroke Mother   . Thyroid disease Mother   . Sleep apnea Mother   . Obesity Mother   . Sudden death Father   . Hypertension Father   . Heart disease Father   . Hyperlipidemia Father   . Prostate cancer Father   . Lung cancer Father   . Obesity Father   . Alcoholism Father   . Sudden death Brother   . Hypertension Brother   . Hyperlipidemia Brother   . Heart attack Brother   . Heart disease Brother   . Stroke Brother   . Alcoholism Brother   . Drug abuse Brother   . Heart disease Sister   . Hypertension Sister   . Thyroid cancer Sister   . Diabetes Neg Hx   . Colon cancer Neg Hx   . Stomach cancer Neg Hx   . Esophageal cancer Neg Hx   . Rectal cancer Neg Hx   . Liver cancer Neg Hx     ROS: Review of Systems  Constitutional: Positive for weight loss.    Gastrointestinal: Negative for nausea and vomiting.  Musculoskeletal:       Negative muscle weakness  PHYSICAL EXAM: Blood pressure 96/68, pulse 76, temperature (!) 97.5 F (36.4 C), temperature source Oral, height 5\' 7"  (1.702 m), weight (!) 420 lb (190.5 kg), SpO2 96 %. Body mass index is 65.78 kg/m. Physical Exam  Constitutional: She is oriented to person, place, and time. She appears well-developed and well-nourished.  Cardiovascular: Normal rate.  Pulmonary/Chest: Effort normal.  Musculoskeletal: Normal range of motion.  Neurological: She is oriented to person, place, and time.  Skin: Skin is warm and dry.  Psychiatric: She has a normal mood and affect. Her behavior is normal.  Vitals reviewed.   RECENT LABS AND TESTS: BMET    Component Value Date/Time   NA 142 05/24/2018 1044   K 4.3 05/24/2018 1044   CL 109 (H) 05/24/2018 1044   CO2 15 (L) 05/24/2018 1044   GLUCOSE 79 05/24/2018 1044   GLUCOSE 83 06/04/2016 1155   BUN 17 05/24/2018 1044   CREATININE 0.88 05/24/2018 1044   CREATININE 0.90 06/04/2016 1155   CALCIUM 9.0 05/24/2018 1044   GFRNONAA 85 05/24/2018 1044   GFRNONAA 84 06/04/2016 1155   GFRAA 98 05/24/2018 1044   GFRAA >89 06/04/2016 1155   Lab Results  Component Value Date   HGBA1C 5.4 05/24/2018   HGBA1C 5.3 12/10/2017   HGBA1C 5.4 06/25/2017   HGBA1C 5.2 05/19/2017   HGBA1C 5.4 01/20/2017   Lab Results  Component Value Date   INSULIN 28.3 (H) 05/24/2018   INSULIN 43.6 (H) 12/10/2017   INSULIN 30.4 (H) 06/25/2017   INSULIN 29.7 (H) 01/20/2017   INSULIN 30.1 (H) 10/27/2016   CBC    Component Value Date/Time   WBC 10.6 12/10/2017 1242   WBC 13.3 (H) 07/03/2017 0610   RBC 4.64 12/10/2017 1242   RBC 4.33 07/03/2017 0610   HGB 12.5 12/10/2017 1242   HCT 38.4 12/10/2017 1242   PLT 293 07/03/2017 0610   PLT 343 05/18/2017 1611   MCV 83 12/10/2017 1242   MCH 26.9 12/10/2017 1242   MCH 25.9 (L) 07/03/2017 0610   MCHC 32.6 12/10/2017  1242   MCHC 32.4 07/03/2017 0610   RDW 16.2 (H) 12/10/2017 1242   LYMPHSABS 5.2 (H) 12/10/2017 1242   MONOABS 0.6 07/03/2017 0610   EOSABS 0.0 12/10/2017 1242   BASOSABS 0.0 12/10/2017 1242   Iron/TIBC/Ferritin/ %Sat    Component Value Date/Time   IRON 25 (L) 05/18/2017 1611   TIBC 363 05/18/2017 1611   FERRITIN 16 05/18/2017 1611   IRONPCTSAT 7 (LL) 05/18/2017 1611   Lipid Panel     Component Value Date/Time   CHOL 146 05/24/2018 1044   TRIG 53 05/24/2018 1044   HDL 41 05/24/2018 1044   CHOLHDL 3.6 11/21/2015 1047   VLDL 10 11/21/2015 1047   LDLCALC 94 05/24/2018 1044   Hepatic Function Panel     Component Value Date/Time   PROT 7.3 05/24/2018 1044   ALBUMIN 4.1 05/24/2018 1044   AST 29 05/24/2018 1044   ALT 24 05/24/2018 1044   ALKPHOS 70 05/24/2018 1044   BILITOT 0.3 05/24/2018 1044      Component Value Date/Time   TSH 1.43 05/19/2017 1245   TSH 1.460 10/27/2016 1058   TSH 0.96 11/21/2015 1047  Results for Catherine [name], Catherine [name] (MRN [id number]) as of 08/18/2018 10:42  Ref. Range 05/24/2018 10:44  Vitamin D, 25-Hydroxy Latest Ref Range: 30.0 - 100.0 ng/mL 39.4    ASSESSMENT AND PLAN: Vitamin D deficiency  Class 3 severe obesity with serious comorbidity and body mass index (BMI)  of 60.0 to 69.9 in adult, unspecified obesity type (HCC)  PLAN:  Vitamin D Deficiency Catherine [name] was informed that low vitamin D levels contributes to fatigue and are associated with obesity, breast, and colon cancer. Catherine [name] agrees to continue taking prescription Vit D @50 ,000 IU every week and will follow up for routine testing of vitamin D, at least 2-3 times per year. She was informed of the risk of over-replacement of vitamin D and agrees to not increase her dose unless she discusses this with [name] first. Catherine [name] agrees to follow up with our clinic in 2 weeks.  Obesity Catherine [name] is currently in the action stage of change. As such, her goal is to continue with weight loss efforts She has  agreed to follow the Category 3 plan Catherine [name] has been instructed to work up to a goal of 150 minutes of combined cardio and strengthening exercise per week for weight loss and overall health benefits. We discussed the following Behavioral Modification Strategies today: increasing lean protein intake and decreasing simple carbohydrates    Catherine [name] has agreed to follow up with our clinic in 2 weeks. She was informed of the importance of frequent follow up visits to maximize her success with intensive lifestyle modifications for her multiple health conditions.   OBESITY BEHAVIORAL INTERVENTION VISIT  Today's visit was # 41   Starting weight: 468 lbs Starting date: 10/27/16 Today's weight : 420 lbs Today's date: 08/17/2018 Total lbs lost to date: 63    ASK: We discussed the diagnosis of obesity with Catherine [name] today and Catherine [name] agreed to give [name] permission to discuss obesity behavioral modification therapy today.  ASSESS: Catherine [name] has the diagnosis of obesity and her BMI today is 65.77 Catherine [name] is in the action stage of change   ADVISE: Catherine [name] was educated on the multiple health risks of obesity as well as the benefit of weight loss to improve her health. She was advised of the need for long term treatment and the importance of lifestyle modifications.  AGREE: Multiple dietary modification options and treatment options were discussed and  Catherine [name] agreed to the above obesity treatment plan.  Trude Mcburney, am acting as transcriptionist for Alois Cliche, PA-C I, Alois Cliche, PA-C have reviewed above note and agree with its content

## 2018-08-20 ENCOUNTER — Ambulatory Visit (INDEPENDENT_AMBULATORY_CARE_PROVIDER_SITE_OTHER): Payer: 59 | Admitting: Family Medicine

## 2018-08-20 VITALS — BP 127/80 | HR 79 | Temp 98.1°F | Ht 67.0 in | Wt >= 6400 oz

## 2018-08-20 DIAGNOSIS — K219 Gastro-esophageal reflux disease without esophagitis: Secondary | ICD-10-CM

## 2018-08-20 MED ORDER — FAMOTIDINE 10 MG PO TABS: 10.0000 mg | ORAL_TABLET | Freq: Every day | ORAL | 6 refills | Status: DC

## 2018-08-20 MED FILL — HEARTBURN RELIEF TABLET: 10 | 30 days supply | Qty: 30 | Fill #0

## 2018-08-20 MED FILL — FUROSEMIDE 20 MG TABS: 20 | 30 days supply | Qty: 60 | Fill #1

## 2018-08-20 NOTE — Progress Notes (Signed)
Subjective: Chief Complaint  Patient presents with  . Medication Problem   HPI: Catherine [name] is a 37 y.o. presenting to clinic today to discuss the following:  Health/Weight Check Up Patient presents today for a routine check up . She has no concerns and is still regularly attending her weight checks with the weight loss clinic at [facility]. She states it is going well and overall she is down 40+lbs since she started. She is still not interested in bariatric surgery and is confident she can continue losing the weight on her own. She had a "stomach bug" recently that led her to have abdominal pain, nausea, and vomiting for several days but that has no resolved. She still struggles to keep to her diet at times but overall feels motivated and that she can continue to lose weight with the program.  GERD Chronic issue that is not well controlled with Zantac. The quality and the frequency of her heart burn has not changed but she states "Zantac is no longer helping". She was taking it twice a day but then stopped after that provided no relief. Part of the issue may be related to the fact that she most likely had a stomach virus recently and was experiencing nausea and vomiting related to her viral infection that may have caused an exacerbating of her GERD.  Health Maintenance: none today    ROS noted in HPI.   Past Medical, Surgical, Social, and Family History Reviewed & Updated per EMR.   Pertinent Historical Findings include:   Social History   Tobacco Use  Smoking Status Former Smoker  . Packs/day: 0.50  . Years: 18.00  . Pack years: 9.00  . Types: Cigarettes  . Start date: 10/13/1996  . Last attempt to quit: 01/12/2015  . Years since quitting: 3.6  Smokeless Tobacco Never Used    Objective: BP 127/80   Pulse 79   Temp 98.1 F (36.7 C) (Oral)   Ht 5\' 7"  (1.702 m)   Wt (!) 420 lb 3.2 oz (190.6 kg)   SpO2 100%   BMI 65.81 kg/m  Vitals and nursing notes reviewed  Physical  Exam Gen: Alert and Oriented x 3, NAD HEENT: Normocephalic, atraumatic, PERRLA, EOMI Neck: trachea midline, no thyroidmegaly, no LAD CV: RRR, no murmurs, normal S1, S2 split Resp: CTAB, no wheezing, rales, or rhonchi, comfortable work of breathing Abd: obese, non-distended, non-tender, soft, +bs in all four quadrants Ext: no clubbing, cyanosis, or edema Neuro: No gross deficits Skin: warm, dry, intact, no rashes  No results found for this or any previous visit (from the past 72 hour(s)).  Assessment/Plan:  GASTROESOPHAGEAL REFLUX, NO ESOPHAGITIS Unclear if this issue is becoming uncontrolled again or if made worse by recent viral GI upset.  - Stopping Ranitidine, starting Pepcid 10mg  at night - Patient will follow up in 4 weeks and if not better controlled with refer to GI given her history of poorly controlled GERD. She may benefit from EGD.   PATIENT EDUCATION PROVIDED: See AVS    Diagnosis and plan along with any newly prescribed medication(s) were discussed in detail with this patient today. The patient verbalized understanding and agreed with the plan. Patient advised if symptoms worsen return to clinic or ER.   Health Maintainance: none   No orders of the defined types were placed in this encounter.   Meds ordered this encounter  Medications  . famotidine (PEPCID) 10 MG tablet    Sig: Take 1 tablet (10  mg total) by mouth daily.    Dispense:  30 tablet    Refill:  6     Tim Karen Chafe, DO 08/20/2018, 3:11 PM PGY-2 [facility]

## 2018-08-20 NOTE — Patient Instructions (Signed)
It was great to meet you today! Thank you for letting me participate in your care!  Today, we discussed your continued weight loss and I want to encourage you to keep losing the weight. Let me know if I can help you in this journey. I am very proud of you for coming so far already.  I have stopped Zantac and started you on Pepcid 10mg  to take at night daily. If you GERD symptoms return please call me and we will go back up to 20mg  for a few weeks. If this issue persists I will send you to Gastroenterology.  Be well, Jules Schick, DO PGY-2, [facility]

## 2018-08-25 ENCOUNTER — Encounter: Payer: Self-pay | Admitting: Family Medicine

## 2018-08-25 NOTE — Assessment & Plan Note (Signed)
Unclear if this issue is becoming uncontrolled again or if made worse by recent viral GI upset.  - Stopping Ranitidine, starting Pepcid 10mg  at night - Patient will follow up in 4 weeks and if not better controlled with refer to GI given her history of poorly controlled GERD. She may benefit from EGD.

## 2018-09-02 ENCOUNTER — Ambulatory Visit (INDEPENDENT_AMBULATORY_CARE_PROVIDER_SITE_OTHER): Payer: 59 | Admitting: Physician Assistant

## 2018-09-02 ENCOUNTER — Encounter (INDEPENDENT_AMBULATORY_CARE_PROVIDER_SITE_OTHER): Payer: Self-pay | Admitting: Physician Assistant

## 2018-09-02 VITALS — BP 96/70 | HR 94 | Temp 98.2°F | Ht 67.0 in | Wt >= 6400 oz

## 2018-09-02 DIAGNOSIS — Z6841 Body Mass Index (BMI) 40.0 and over, adult: Secondary | ICD-10-CM

## 2018-09-02 DIAGNOSIS — E559 Vitamin D deficiency, unspecified: Secondary | ICD-10-CM

## 2018-09-02 DIAGNOSIS — I10 Essential (primary) hypertension: Secondary | ICD-10-CM

## 2018-09-02 DIAGNOSIS — Z9189 Other specified personal risk factors, not elsewhere classified: Secondary | ICD-10-CM | POA: Diagnosis not present

## 2018-09-02 MED ORDER — LIRAGLUTIDE -WEIGHT MANAGEMENT 18 MG/3ML ~~LOC~~ SOPN: 3.0000 mg | PEN_INJECTOR | Freq: Every day | SUBCUTANEOUS | 0 refills | Status: DC

## 2018-09-02 MED FILL — SAXENDA 18 MG/3 ML PEN: 18 | 30 days supply | Qty: 15 | Fill #0

## 2018-09-06 NOTE — Progress Notes (Signed)
Office: [phone]  /  Fax: [phone]   HPI:   Chief Complaint: OBESITY Catherine [name] is here to discuss her progress with her obesity treatment plan. She is on the  follow the Category 3 plan and is following her eating plan approximately 85 % of the time. She states she is doing leg press and on the treadmill for 30 minutes 3-4 times per week. Catherine [name] did very well with weight loss. She reports that the past 2 days since eating spicy [employer] chicken, her stomach has been upset. Otherwise, she has been trying to eat close to the plan.  Her weight is (!) 410 lb (186 kg) today and has had a weight loss of 10 pounds over a period of 2 weeks since her last visit. She has lost 58 lbs since starting treatment with [name].  Vitamin D Deficiency Catherine [name] has a diagnosis of vitamin D deficiency. She is currently taking prescription Vit D and denies nausea, vomiting or muscle weakness.  Hypertension Catherine [name] is a 37 y.o. female with hypertension. Catherine [name]'s blood pressure is controlled. She is on furosemide and denies chest pain. She is working weight loss to help control her blood pressure with the goal of decreasing her risk of heart attack and stroke.   At risk for cardiovascular disease Catherine [name] is at a higher than average risk for cardiovascular disease due to obesity and hypertension. She currently denies any chest pain.  ALLERGIES: Allergies  Allergen Reactions  . Citrus Swelling    MEDICATIONS: Current Outpatient Medications on File Prior to Visit  Medication Sig Dispense Refill  . aspirin-acetaminophen-caffeine (EXCEDRIN MIGRAINE) 250-250-65 MG per tablet Take 2 tablets by mouth 2 (two) times daily as needed for migraine. For headache     . budesonide (PULMICORT) 180 MCG/ACT inhaler Inhale 1 puff into the lungs 2 (two) times daily. 1 Inhaler 1  . buPROPion (WELLBUTRIN SR) 200 MG 12 hr tablet Take 1 tablet (200 mg total) by mouth daily at 12 noon. 30 tablet 0  . cetirizine  (ZYRTEC) 10 MG tablet Take 10 mg by mouth daily.    . diclofenac (CATAFLAM) 50 MG tablet Take 50 mg by mouth 2 (two) times daily at 10 AM and 5 PM.    . diclofenac sodium (VOLTAREN) 1 % GEL Apply 2 g topically 4 (four) times daily. 100 g 1  . famotidine (PEPCID) 10 MG tablet Take 1 tablet (10 mg total) by mouth daily. 30 tablet 6  . furosemide (LASIX) 20 MG tablet Take 1 tablet (20 mg total) by mouth 2 (two) times daily. 60 tablet 3  . gabapentin (NEURONTIN) 300 MG capsule TAKE 2 CAPSULES (600 MG TOTAL) BY MOUTH 3 TIMES A DAY 180 capsule 3  . ibuprofen (ADVIL,MOTRIN) 200 MG tablet Take 600-800 mg by mouth every 8 (eight) hours as needed (for pain.).     . Insulin Pen Needle (BD PEN NEEDLE NANO U/F) 32G X 4 MM MISC 1 Package by Does not apply route daily at 12 noon. 100 each 0  . meloxicam (MOBIC) 15 MG tablet Take 1 tablet (15 mg total) by mouth daily. 30 tablet 0  . polyethylene glycol powder (GLYCOLAX/MIRALAX) powder Take 17 g by mouth daily as needed. 3350 g 1  . Vitamin D, Ergocalciferol, (DRISDOL) 50000 units CAPS capsule Take 1 capsule (50,000 Units total) by mouth every 7 (seven) days. 4 capsule 0   No current facility-administered medications on file prior to visit.     PAST MEDICAL HISTORY: Past Medical  History:  Diagnosis Date  . Allergy   . Anxiety   . Arthritis   . Asthma    since baby, seasonal  . ASTHMA, UNSPECIFIED 12/10/2006   Qualifier: Diagnosis of  By: Abundio Miu    . CELLULITIS AND ABSCESS OF LEG EXCEPT FOOT 12/30/2007   Qualifier: Diagnosis of  By: Daphine Deutscher FNP, Zena Amos    . Cyst of bone    right side of spine  . DEPENDENT EDEMA, LEGS, BILATERAL 11/22/2008   Qualifier: Diagnosis of  By: Barbaraann Barthel MD, [place]    . Depression 2003Dx  . Edema   . Food allergy    citris  . FOOT PAIN, RIGHT 11/22/2008   Qualifier: Diagnosis of  By: Barbaraann Barthel MD, [place]    . Gastric ulcer   . GERD (gastroesophageal reflux disease)   . Gout   . Hypertension    took Norvasc for  3 months, doctor discontinued no longer on BP medications  . Joint pain   . KNEE INJURY, LEFT 01/01/2009   Qualifier: Diagnosis of  By: Delrae Alfred MD, Lanora Manis    . Migraine   . ONYCHOMYCOSIS, TOENAILS 11/22/2008   Qualifier: Diagnosis of  By: Barbaraann Barthel MD, [place]    . Shortness of breath   . Sleep apnea   . TINEA PEDIS 11/22/2008   Qualifier: Diagnosis of  By: Barbaraann Barthel MD, Benetta Spar      PAST SURGICAL HISTORY: Past Surgical History:  Procedure Laterality Date  . DILITATION & CURRETTAGE/HYSTROSCOPY WITH NOVASURE ABLATION N/A 07/03/2017   Procedure: DILATATION & CURETTAGE/HYSTEROSCOPY WITH NOVASURE ABLATION;  Surgeon: Genia Del, MD;  Location: [facility];  Service: Gynecology;  Laterality: N/A;  . UPPER GI ENDOSCOPY    . WISDOM TOOTH EXTRACTION  2008   x4    SOCIAL HISTORY: Social History   Tobacco Use  . Smoking status: Former Smoker    Packs/day: 0.50    Years: 18.00    Pack years: 9.00    Types: Cigarettes    Start date: 10/13/1996    Last attempt to quit: 01/12/2015    Years since quitting: 3.6  . Smokeless tobacco: Never Used  Substance Use Topics  . Alcohol use: Yes    Alcohol/week: 1.0 standard drinks    Types: 1 Standard drinks or equivalent per week    Comment: rarely drinks  . Drug use: No    Comment: marjuana - former use, quit 4 years ago    FAMILY HISTORY: Family History  Problem Relation Age of Onset  . Hypertension Mother   . Hyperlipidemia Mother   . Cancer Mother        bone cancer  . Heart disease Mother   . Sudden death Mother   . Stroke Mother   . Thyroid disease Mother   . Sleep apnea Mother   . Obesity Mother   . Sudden death Father   . Hypertension Father   . Heart disease Father   . Hyperlipidemia Father   . Prostate cancer Father   . Lung cancer Father   . Obesity Father   . Alcoholism Father   . Sudden death Brother   . Hypertension Brother   . Hyperlipidemia Brother   . Heart attack Brother   . Heart disease Brother   . Stroke  Brother   . Alcoholism Brother   . Drug abuse Brother   . Heart disease Sister   . Hypertension Sister   . Thyroid cancer Sister   . Diabetes Neg Hx   . Colon cancer  Neg Hx   . Stomach cancer Neg Hx   . Esophageal cancer Neg Hx   . Rectal cancer Neg Hx   . Liver cancer Neg Hx     ROS: Review of Systems  Constitutional: Positive for weight loss.  Cardiovascular: Negative for chest pain.  Gastrointestinal: Negative for nausea and vomiting.  Musculoskeletal:       Negative muscle weakness    PHYSICAL EXAM: Blood pressure 96/70, pulse 94, temperature 98.2 F (36.8 C), temperature source Oral, height 5\' 7"  (1.702 m), weight (!) 410 lb (186 kg), SpO2 100 %. Body mass index is 64.22 kg/m. Physical Exam  Constitutional: She is oriented to person, place, and time. She appears well-developed and well-nourished.  Cardiovascular: Normal rate.  Pulmonary/Chest: Effort normal.  Musculoskeletal: Normal range of motion.  Neurological: She is oriented to person, place, and time.  Skin: Skin is warm and dry.  Psychiatric: She has a normal mood and affect. Her behavior is normal.  Vitals reviewed.   RECENT LABS AND TESTS: BMET    Component Value Date/Time   NA 142 05/24/2018 1044   K 4.3 05/24/2018 1044   CL 109 (H) 05/24/2018 1044   CO2 15 (L) 05/24/2018 1044   GLUCOSE 79 05/24/2018 1044   GLUCOSE 83 06/04/2016 1155   BUN 17 05/24/2018 1044   CREATININE 0.88 05/24/2018 1044   CREATININE 0.90 06/04/2016 1155   CALCIUM 9.0 05/24/2018 1044   GFRNONAA 85 05/24/2018 1044   GFRNONAA 84 06/04/2016 1155   GFRAA 98 05/24/2018 1044   GFRAA >89 06/04/2016 1155   Lab Results  Component Value Date   HGBA1C 5.4 05/24/2018   HGBA1C 5.3 12/10/2017   HGBA1C 5.4 06/25/2017   HGBA1C 5.2 05/19/2017   HGBA1C 5.4 01/20/2017   Lab Results  Component Value Date   INSULIN 28.3 (H) 05/24/2018   INSULIN 43.6 (H) 12/10/2017   INSULIN 30.4 (H) 06/25/2017   INSULIN 29.7 (H) 01/20/2017    INSULIN 30.1 (H) 10/27/2016   CBC    Component Value Date/Time   WBC 10.6 12/10/2017 1242   WBC 13.3 (H) 07/03/2017 0610   RBC 4.64 12/10/2017 1242   RBC 4.33 07/03/2017 0610   HGB 12.5 12/10/2017 1242   HCT 38.4 12/10/2017 1242   PLT 293 07/03/2017 0610   PLT 343 05/18/2017 1611   MCV 83 12/10/2017 1242   MCH 26.9 12/10/2017 1242   MCH 25.9 (L) 07/03/2017 0610   MCHC 32.6 12/10/2017 1242   MCHC 32.4 07/03/2017 0610   RDW 16.2 (H) 12/10/2017 1242   LYMPHSABS 5.2 (H) 12/10/2017 1242   MONOABS 0.6 07/03/2017 0610   EOSABS 0.0 12/10/2017 1242   BASOSABS 0.0 12/10/2017 1242   Iron/TIBC/Ferritin/ %Sat    Component Value Date/Time   IRON 25 (L) 05/18/2017 1611   TIBC 363 05/18/2017 1611   FERRITIN 16 05/18/2017 1611   IRONPCTSAT 7 (LL) 05/18/2017 1611   Lipid Panel     Component Value Date/Time   CHOL 146 05/24/2018 1044   TRIG 53 05/24/2018 1044   HDL 41 05/24/2018 1044   CHOLHDL 3.6 11/21/2015 1047   VLDL 10 11/21/2015 1047   LDLCALC 94 05/24/2018 1044   Hepatic Function Panel     Component Value Date/Time   PROT 7.3 05/24/2018 1044   ALBUMIN 4.1 05/24/2018 1044   AST 29 05/24/2018 1044   ALT 24 05/24/2018 1044   ALKPHOS 70 05/24/2018 1044   BILITOT 0.3 05/24/2018 1044      Component Value  Date/Time   TSH 1.43 05/19/2017 1245   TSH 1.460 10/27/2016 1058   TSH 0.96 11/21/2015 1047  Results for Catherine IlesWALKER, Maya N (MRN [id number]) as of 09/06/2018 16:49  Ref. Range 05/24/2018 10:44  Vitamin D, 25-Hydroxy Latest Ref Range: 30.0 - 100.0 ng/mL 39.4    ASSESSMENT AND PLAN: Vitamin D deficiency  Essential hypertension  At risk for heart disease  Class 3 severe obesity with serious comorbidity and body mass index (BMI) of 60.0 to 69.9 in adult, unspecified obesity type (HCC) - Plan: Liraglutide -Weight Management (SAXENDA) 18 MG/3ML SOPN  PLAN:  Vitamin D Deficiency Catherine [name] was informed that low vitamin D levels contributes to fatigue and are associated with  obesity, breast, and colon cancer. Catherine [name] agrees to continue taking prescription Vit D @50 ,000 IU every week and will follow up for routine testing of vitamin D, at least 2-3 times per year. She was informed of the risk of over-replacement of vitamin D and agrees to not increase her dose unless she discusses this with us first. We will recheck labs at next visit. Catherine [name] agrees to follow up with our clinic in 2 weeks.  Hypertension We discussed sodium restriction, working on healthy weight loss, and a regular exercise program as the means to achieve improved blood pressure control. Catherine [name] agreed with this plan and agreed to follow up as directed. We will continue to monitor her blood pressure as well as her progress with the above lifestyle modifications. Catherine [name] agrees to continue her medications, diet, exercise, and weight loss, and she will watch for signs of hypotension as she continues her lifestyle modifications. Catherine [name] agrees to follow up with our clinic in 2 weeks.  Cardiovascular risk counselling Catherine [name] was given extended (15 minutes) coronary artery disease prevention counseling today. She is 37 y.o. female and has risk factors for heart disease including obesity and hypertension. We discussed intensive lifestyle modifications today with an emphasis on specific weight loss instructions and strategies. Pt was also informed of the importance of increasing exercise and decreasing saturated fats to help prevent heart disease.  Obesity Catherine [name] is currently in the action stage of change. As such, her goal is to continue with weight loss efforts She has agreed to follow the Category 3 plan Catherine [name] has been instructed to work up to a goal of 150 minutes of combined cardio and strengthening exercise per week for weight loss and overall health benefits. We discussed the following Behavioral Modification Strategies today: work on meal planning and easy cooking plans and holiday eating strategies  We  discussed various medication options to help Kayona with her weight loss efforts and we both agreed to continue Saxenda 3 mg SubQ daily #5 pens and we will refill for 1 month.   Catherine [name] has agreed to follow up with our clinic in 2 weeks. She was informed of the importance of frequent follow up visits to maximize her success with intensive lifestyle modifications for her multiple health conditions.   OBESITY BEHAVIORAL INTERVENTION VISIT  Today's visit was # 42   Starting weight: 468 lbs Starting date: 10/27/16 Today's weight : 410 lbs Today's date: 09/02/2018 Total lbs lost to date: 5358    ASK: We discussed the diagnosis of obesity with Catherine [name] today and Catherine [name] agreed to give us permission to discuss obesity behavioral modification therapy today.  ASSESS: Catherine [name] has the diagnosis of obesity and her BMI today is 7164.2 [place] is in the action stage of change   ADVISE: Catherine [name] was educated on the  multiple health risks of obesity as well as the benefit of weight loss to improve her health. She was advised of the need for long term treatment and the importance of lifestyle modifications.  AGREE: Multiple dietary modification options and treatment options were discussed and  Niajah agreed to the above obesity treatment plan.  Trude Mcburney, am acting as transcriptionist for Alois Cliche, PA-C I, Alois Cliche, PA-C have reviewed above note and agree with its content

## 2018-09-23 ENCOUNTER — Encounter: Payer: Self-pay | Admitting: Family Medicine

## 2018-09-26 ENCOUNTER — Other Ambulatory Visit: Payer: Self-pay | Admitting: Family Medicine

## 2018-09-26 DIAGNOSIS — I10 Essential (primary) hypertension: Secondary | ICD-10-CM

## 2018-09-26 NOTE — Progress Notes (Signed)
Patient asked for a potassium pill as she was on one in the past as she takes Lasix daily. Her last BMP showed a potassium of 4.3 back in 05/24/2018.  Will have patient come in for labs only and if low will have patient start potassium pill 10mEq daily and will recheck her potassium in 2 weeks after starting the potassium pill

## 2018-09-27 ENCOUNTER — Other Ambulatory Visit: Payer: 59

## 2018-09-27 DIAGNOSIS — I10 Essential (primary) hypertension: Secondary | ICD-10-CM | POA: Diagnosis not present

## 2018-09-28 ENCOUNTER — Ambulatory Visit (INDEPENDENT_AMBULATORY_CARE_PROVIDER_SITE_OTHER): Payer: 59 | Admitting: Physician Assistant

## 2018-09-28 ENCOUNTER — Encounter (INDEPENDENT_AMBULATORY_CARE_PROVIDER_SITE_OTHER): Payer: Self-pay | Admitting: Physician Assistant

## 2018-09-28 VITALS — BP 97/65 | HR 93 | Temp 98.4°F | Ht 67.0 in | Wt >= 6400 oz

## 2018-09-28 DIAGNOSIS — E559 Vitamin D deficiency, unspecified: Secondary | ICD-10-CM

## 2018-09-28 DIAGNOSIS — F3289 Other specified depressive episodes: Secondary | ICD-10-CM | POA: Diagnosis not present

## 2018-09-28 DIAGNOSIS — Z9189 Other specified personal risk factors, not elsewhere classified: Secondary | ICD-10-CM | POA: Diagnosis not present

## 2018-09-28 DIAGNOSIS — Z6841 Body Mass Index (BMI) 40.0 and over, adult: Secondary | ICD-10-CM

## 2018-09-28 LAB — BASIC METABOLIC PANEL
BUN/Creatinine Ratio: 14 (ref 9–23)
BUN: 16 mg/dL (ref 6–20)
CO2: 24 mmol/L (ref 20–29)
Calcium: 9.2 mg/dL (ref 8.7–10.2)
Chloride: 100 mmol/L (ref 96–106)
Creatinine, Ser: 1.18 mg/dL — ABNORMAL HIGH (ref 0.57–1.00)
GFR calc Af Amer: 69 mL/min/{1.73_m2} (ref >59)
GFR calc non Af Amer: 59 mL/min/{1.73_m2} — ABNORMAL LOW (ref >59)
Glucose: 73 mg/dL (ref 65–99)
Potassium: 4.7 mmol/L (ref 3.5–5.2)
Sodium: 140 mmol/L (ref 134–144)

## 2018-09-28 MED ORDER — BUPROPION HCL ER (SR) 200 MG PO TB12: 200.0000 mg | ORAL_TABLET | Freq: Every day | ORAL | 0 refills | Status: DC

## 2018-09-28 MED ORDER — LIRAGLUTIDE -WEIGHT MANAGEMENT 18 MG/3ML ~~LOC~~ SOPN: 3.0000 mg | PEN_INJECTOR | Freq: Every day | SUBCUTANEOUS | 0 refills | Status: DC

## 2018-09-28 MED ORDER — VITAMIN D (ERGOCALCIFEROL) 1.25 MG (50000 UNIT) PO CAPS: 50000.0000 [IU] | ORAL_CAPSULE | ORAL | 0 refills | Status: DC

## 2018-09-28 MED FILL — SAXENDA 18 MG/3 ML PEN: 18 | 30 days supply | Qty: 15 | Fill #0

## 2018-09-28 MED FILL — BUPROPION HCL SR 200 MG TAB: 200 | 30 days supply | Qty: 30 | Fill #0

## 2018-09-28 MED FILL — VIT D2 1.25 MG (50,000 UNIT: 1.25 MG | 28 days supply | Qty: 4 | Fill #0

## 2018-09-28 NOTE — Progress Notes (Signed)
Office: [phone]  /  Fax: [phone]   HPI:   Chief Complaint: OBESITY Zarinah is here to discuss her progress with her obesity treatment plan. She is on the Category 3 plan and is following her eating plan approximately 80 % of the time. She states she is walking on the treadmill and doing leg press and cardio 45 minutes 3 times per week. Kristain reports that she recently moved and she has been unable to pack her lunch. She is ready to get back on track. Her weight is (!) 417 lb (189.1 kg) today and has had a weight gain of 7 pounds over a period of 4 weeks since her last visit. She has lost 51 lbs since starting treatment with [name].  Vitamin D deficiency Shantrell has a diagnosis of vitamin D deficiency. She is currently taking vit D and denies nausea, vomiting or muscle weakness.  At risk for osteopenia and osteoporosis Kallen is at higher risk of osteopenia and osteoporosis due to vitamin D deficiency.   Depression with emotional eating behaviors Chancey states that she has not had any emotional eating recently. She is currently taking Bupropion. Vessie struggles with emotional eating and using food for comfort to the extent that it is negatively impacting her health. She often snacks when she is not hungry. Zynasia sometimes feels she is out of control and then feels guilty that she made poor food choices. She has been working on behavior modification techniques to help reduce her emotional eating and has been somewhat successful. Her blood pressure is normal. She shows no sign of suicidal or homicidal ideations.  Depression screen [facility] 2/9 01/18/2018 10/28/2017 07/14/2017 05/18/2017 03/24/2017  Decreased Interest 0 0 0 0 0  Down, Depressed, Hopeless 0 0 0 0 0  PHQ - 2 Score 0 0 0 0 0  Altered sleeping - - - - -  Tired, decreased energy - - - - -  Change in appetite - - - - -  Feeling bad or failure about yourself  - - - - -  Trouble concentrating - - - - -  Moving slowly or  fidgety/restless - - - - -  Suicidal thoughts - - - - -  PHQ-9 Score - - - - -  Some encounter information is confidential and restricted. Go to Review Flowsheets activity to see all data.  Some recent data might be hidden     ASSESSMENT AND PLAN:  Vitamin D deficiency - Plan: Hemoglobin A1c, Insulin, random, VITAMIN D 25 Hydroxy (Vit-D Deficiency, Fractures), Vitamin D, Ergocalciferol, (DRISDOL) 1.25 MG (50000 UT) CAPS capsule  Other depression - Plan: buPROPion (WELLBUTRIN SR) 200 MG 12 hr tablet  At risk for osteoporosis  Class 3 severe obesity with serious comorbidity and body mass index (BMI) of 60.0 to 69.9 in adult, unspecified obesity type (HCC) - Plan: Liraglutide -Weight Management (SAXENDA) 18 MG/3ML SOPN  PLAN:  Vitamin D Deficiency Shonice was informed that low vitamin D levels contributes to fatigue and are associated with obesity, breast, and colon cancer. She agrees to continue to take prescription Vit D @50 ,000 IU every week #4 with no refills and will follow up for routine testing of vitamin D, at least 2-3 times per year. She was informed of the risk of over-replacement of vitamin D and agrees to not increase her dose unless she discusses this with [name] first. Aiyanah agrees to follow up as directed.  At risk for osteopenia and osteoporosis Floye was given extended  (15 minutes)  osteoporosis prevention counseling today. Greidy is at risk for osteopenia and osteoporosis due to her vitamin D deficiency. She was encouraged to take her vitamin D and follow her higher calcium diet and increase strengthening exercise to help strengthen her bones and decrease her risk of osteopenia and osteoporosis.  Depression with Emotional Eating Behaviors We discussed behavior modification techniques today to help Edla deal with her emotional eating and depression. She has agreed to take Bupropion (Wellbutrin SR) 200 mg qd #30 with no refills and follow up as  directed.  Obesity Amorie is currently in the action stage of change. As such, her goal is to continue with weight loss efforts She has agreed to follow the Category 3 plan Cherika has been instructed to work up to a goal of 150 minutes of combined cardio and strengthening exercise per week for weight loss and overall health benefits. We discussed the following Behavioral Modification Strategies today: work on meal planning and easy cooking plans and holiday eating strategies   We discussed various medication options to help Ryana with her weight loss efforts and we both agreed to continue Saxenda and prescription refill was written today for #5 pens.  Rosslyn has agreed to follow up with our clinic in 3 weeks. She was informed of the importance of frequent follow up visits to maximize her success with intensive lifestyle modifications for her multiple health conditions.  ALLERGIES: Allergies  Allergen Reactions  . Citrus Swelling    MEDICATIONS: Current Outpatient Medications on File Prior to Visit  Medication Sig Dispense Refill  . aspirin-acetaminophen-caffeine (EXCEDRIN MIGRAINE) 250-250-65 MG per tablet Take 2 tablets by mouth 2 (two) times daily as needed for migraine. For headache     . budesonide (PULMICORT) 180 MCG/ACT inhaler Inhale 1 puff into the lungs 2 (two) times daily. 1 Inhaler 1  . cetirizine (ZYRTEC) 10 MG tablet Take 10 mg by mouth daily.    . diclofenac (CATAFLAM) 50 MG tablet Take 50 mg by mouth 2 (two) times daily at 10 AM and 5 PM.    . diclofenac sodium (VOLTAREN) 1 % GEL Apply 2 g topically 4 (four) times daily. 100 g 1  . famotidine (PEPCID) 10 MG tablet Take 1 tablet (10 mg total) by mouth daily. 30 tablet 6  . furosemide (LASIX) 20 MG tablet Take 1 tablet (20 mg total) by mouth 2 (two) times daily. 60 tablet 3  . gabapentin (NEURONTIN) 300 MG capsule TAKE 2 CAPSULES (600 MG TOTAL) BY MOUTH 3 TIMES A DAY 180 capsule 3  . ibuprofen (ADVIL,MOTRIN) 200 MG  tablet Take 600-800 mg by mouth every 8 (eight) hours as needed (for pain.).     . Insulin Pen Needle (BD PEN NEEDLE NANO U/F) 32G X 4 MM MISC 1 Package by Does not apply route daily at 12 noon. 100 each 0  . meloxicam (MOBIC) 15 MG tablet Take 1 tablet (15 mg total) by mouth daily. 30 tablet 0  . polyethylene glycol powder (GLYCOLAX/MIRALAX) powder Take 17 g by mouth daily as needed. 3350 g 1   No current facility-administered medications on file prior to visit.     PAST MEDICAL HISTORY: Past Medical History:  Diagnosis Date  . Allergy   . Anxiety   . Arthritis   . Asthma    since baby, seasonal  . ASTHMA, UNSPECIFIED 12/10/2006   Qualifier: Diagnosis of  By: Abundio Miu    . CELLULITIS AND ABSCESS OF LEG EXCEPT FOOT 12/30/2007   Qualifier: Diagnosis of  By: Daphine Deutscher FNP, Zena Amos    . Cyst of bone    right side of spine  . DEPENDENT EDEMA, LEGS, BILATERAL 11/22/2008   Qualifier: Diagnosis of  By: Barbaraann Barthel MD, [place]    . Depression 2003Dx  . Edema   . Food allergy    citris  . FOOT PAIN, RIGHT 11/22/2008   Qualifier: Diagnosis of  By: Barbaraann Barthel MD, [place]    . Gastric ulcer   . GERD (gastroesophageal reflux disease)   . Gout   . Hypertension    took Norvasc for 3 months, doctor discontinued no longer on BP medications  . Joint pain   . KNEE INJURY, LEFT 01/01/2009   Qualifier: Diagnosis of  By: Delrae Alfred MD, Lanora Manis    . Migraine   . ONYCHOMYCOSIS, TOENAILS 11/22/2008   Qualifier: Diagnosis of  By: Barbaraann Barthel MD, [place]    . Shortness of breath   . Sleep apnea   . TINEA PEDIS 11/22/2008   Qualifier: Diagnosis of  By: Barbaraann Barthel MD, Benetta Spar      PAST SURGICAL HISTORY: Past Surgical History:  Procedure Laterality Date  . DILITATION & CURRETTAGE/HYSTROSCOPY WITH NOVASURE ABLATION N/A 07/03/2017   Procedure: DILATATION & CURETTAGE/HYSTEROSCOPY WITH NOVASURE ABLATION;  Surgeon: Genia Del, MD;  Location: [facility];  Service: Gynecology;  Laterality: N/A;  . UPPER GI  ENDOSCOPY    . WISDOM TOOTH EXTRACTION  2008   x4    SOCIAL HISTORY: Social History   Tobacco Use  . Smoking status: Former Smoker    Packs/day: 0.50    Years: 18.00    Pack years: 9.00    Types: Cigarettes    Start date: 10/13/1996    Last attempt to quit: 01/12/2015    Years since quitting: 3.7  . Smokeless tobacco: Never Used  Substance Use Topics  . Alcohol use: Yes    Alcohol/week: 1.0 standard drinks    Types: 1 Standard drinks or equivalent per week    Comment: rarely drinks  . Drug use: No    Comment: marjuana - former use, quit 4 years ago    FAMILY HISTORY: Family History  Problem Relation Age of Onset  . Hypertension Mother   . Hyperlipidemia Mother   . Cancer Mother        bone cancer  . Heart disease Mother   . Sudden death Mother   . Stroke Mother   . Thyroid disease Mother   . Sleep apnea Mother   . Obesity Mother   . Sudden death Father   . Hypertension Father   . Heart disease Father   . Hyperlipidemia Father   . Prostate cancer Father   . Lung cancer Father   . Obesity Father   . Alcoholism Father   . Sudden death Brother   . Hypertension Brother   . Hyperlipidemia Brother   . Heart attack Brother   . Heart disease Brother   . Stroke Brother   . Alcoholism Brother   . Drug abuse Brother   . Heart disease Sister   . Hypertension Sister   . Thyroid cancer Sister   . Diabetes Neg Hx   . Colon cancer Neg Hx   . Stomach cancer Neg Hx   . Esophageal cancer Neg Hx   . Rectal cancer Neg Hx   . Liver cancer Neg Hx     ROS: Review of Systems  Constitutional: Negative for weight loss.  Gastrointestinal: Negative for nausea and vomiting.  Musculoskeletal:  Negative for muscle weakness  Psychiatric/Behavioral: Positive for depression. Negative for suicidal ideas.    PHYSICAL EXAM: Blood pressure 97/65, pulse 93, temperature 98.4 F (36.9 C), temperature source Oral, height 5\' 7"  (1.702 m), weight (!) 417 lb (189.1 kg), SpO2 100  %. Body mass index is 65.31 kg/m. Physical Exam Vitals signs reviewed.  Constitutional:      Appearance: Normal appearance. She is well-developed. She is obese.  Cardiovascular:     Rate and Rhythm: Normal rate.  Pulmonary:     Effort: Pulmonary effort is normal.  Musculoskeletal: Normal range of motion.  Skin:    General: Skin is warm and dry.  Neurological:     Mental Status: She is alert and oriented to person, place, and time.  Psychiatric:        Mood and Affect: Mood normal.        Behavior: Behavior normal.        Thought Content: Thought content does not include homicidal or suicidal ideation.     RECENT LABS AND TESTS: BMET    Component Value Date/Time   NA 140 09/27/2018 1602   K 4.7 09/27/2018 1602   CL 100 09/27/2018 1602   CO2 24 09/27/2018 1602   GLUCOSE 73 09/27/2018 1602   GLUCOSE 83 06/04/2016 1155   BUN 16 09/27/2018 1602   CREATININE 1.18 (H) 09/27/2018 1602   CREATININE 0.90 06/04/2016 1155   CALCIUM 9.2 09/27/2018 1602   GFRNONAA 59 (L) 09/27/2018 1602   GFRNONAA 84 06/04/2016 1155   GFRAA 69 09/27/2018 1602   GFRAA >89 06/04/2016 1155   Lab Results  Component Value Date   HGBA1C 5.4 05/24/2018   HGBA1C 5.3 12/10/2017   HGBA1C 5.4 06/25/2017   HGBA1C 5.2 05/19/2017   HGBA1C 5.4 01/20/2017   Lab Results  Component Value Date   INSULIN 28.3 (H) 05/24/2018   INSULIN 43.6 (H) 12/10/2017   INSULIN 30.4 (H) 06/25/2017   INSULIN 29.7 (H) 01/20/2017   INSULIN 30.1 (H) 10/27/2016   CBC    Component Value Date/Time   WBC 10.6 12/10/2017 1242   WBC 13.3 (H) 07/03/2017 0610   RBC 4.64 12/10/2017 1242   RBC 4.33 07/03/2017 0610   HGB 12.5 12/10/2017 1242   HCT 38.4 12/10/2017 1242   PLT 293 07/03/2017 0610   PLT 343 05/18/2017 1611   MCV 83 12/10/2017 1242   MCH 26.9 12/10/2017 1242   MCH 25.9 (L) 07/03/2017 0610   MCHC 32.6 12/10/2017 1242   MCHC 32.4 07/03/2017 0610   RDW 16.2 (H) 12/10/2017 1242   LYMPHSABS 5.2 (H) 12/10/2017 1242    MONOABS 0.6 07/03/2017 0610   EOSABS 0.0 12/10/2017 1242   BASOSABS 0.0 12/10/2017 1242   Iron/TIBC/Ferritin/ %Sat    Component Value Date/Time   IRON 25 (L) 05/18/2017 1611   TIBC 363 05/18/2017 1611   FERRITIN 16 05/18/2017 1611   IRONPCTSAT 7 (LL) 05/18/2017 1611   Lipid Panel     Component Value Date/Time   CHOL 146 05/24/2018 1044   TRIG 53 05/24/2018 1044   HDL 41 05/24/2018 1044   CHOLHDL 3.6 11/21/2015 1047   VLDL 10 11/21/2015 1047   LDLCALC 94 05/24/2018 1044   Hepatic Function Panel     Component Value Date/Time   PROT 7.3 05/24/2018 1044   ALBUMIN 4.1 05/24/2018 1044   AST 29 05/24/2018 1044   ALT 24 05/24/2018 1044   ALKPHOS 70 05/24/2018 1044   BILITOT 0.3 05/24/2018 1044  Component Value Date/Time   TSH 1.43 05/19/2017 1245   TSH 1.460 10/27/2016 1058   TSH 0.96 11/21/2015 1047    Ref. Range 05/24/2018 10:44  Vitamin D, 25-Hydroxy Latest Ref Range: 30.0 - 100.0 ng/mL 39.4     OBESITY BEHAVIORAL INTERVENTION VISIT  Today's visit was # 43  Starting weight: 468 lbs Starting date: 10/27/2016 Today's weight : 417 lbs  Today's date: 09/28/2018 Total lbs lost to date: 6651   ASK: We discussed the diagnosis of obesity with Heywood IlesJansala N Kriesel today and [name] agreed to give us permission to discuss obesity behavioral modification therapy today.  ASSESS: [name] has the diagnosis of obesity and her BMI today is 65.3 Nasreen is in the action stage of change   ADVISE: [name] was educated on the multiple health risks of obesity as well as the benefit of weight loss to improve her health. She was advised of the need for long term treatment and the importance of lifestyle modifications to improve her current health and to decrease her risk of future health problems.  AGREE: Multiple dietary modification options and treatment options were discussed and  Pearla agreed to follow the recommendations documented in the above note.  ARRANGE: [name]  was educated on the importance of frequent visits to treat obesity as outlined per CMS and USPSTF guidelines and agreed to schedule her next follow up appointment today.  Cristi LoronI, Joanne Murray, am acting as transcriptionist for Alois Clicheracey Lynnell Fiumara, PA-C I, Alois Clicheracey Arcadio Cope, PA-C have reviewed above note and agree with its content

## 2018-09-29 LAB — INSULIN, RANDOM: INSULIN: 29.7 u[IU]/mL — ABNORMAL HIGH (ref 2.6–24.9)

## 2018-09-29 LAB — HEMOGLOBIN A1C
Est. average glucose Bld gHb Est-mCnc: 111 mg/dL
Hgb A1c MFr Bld: 5.5 % (ref 4.8–5.6)

## 2018-09-29 LAB — VITAMIN D 25 HYDROXY (VIT D DEFICIENCY, FRACTURES): Vit D, 25-Hydroxy: 25.6 ng/mL — ABNORMAL LOW (ref 30.0–100.0)

## 2018-10-07 MED FILL — FUROSEMIDE 20 MG TABS: 20 | 30 days supply | Qty: 60 | Fill #2

## 2018-10-09 DIAGNOSIS — G4733 Obstructive sleep apnea (adult) (pediatric): Secondary | ICD-10-CM | POA: Diagnosis not present

## 2018-10-21 ENCOUNTER — Ambulatory Visit (INDEPENDENT_AMBULATORY_CARE_PROVIDER_SITE_OTHER): Payer: 59 | Admitting: Physician Assistant

## 2018-10-21 ENCOUNTER — Encounter (INDEPENDENT_AMBULATORY_CARE_PROVIDER_SITE_OTHER): Payer: Self-pay | Admitting: Physician Assistant

## 2018-10-21 VITALS — BP 101/68 | HR 84 | Temp 98.0°F | Ht 67.0 in | Wt >= 6400 oz

## 2018-10-21 DIAGNOSIS — Z9189 Other specified personal risk factors, not elsewhere classified: Secondary | ICD-10-CM

## 2018-10-21 DIAGNOSIS — E8881 Metabolic syndrome: Secondary | ICD-10-CM

## 2018-10-21 DIAGNOSIS — E559 Vitamin D deficiency, unspecified: Secondary | ICD-10-CM

## 2018-10-21 DIAGNOSIS — Z6841 Body Mass Index (BMI) 40.0 and over, adult: Secondary | ICD-10-CM | POA: Diagnosis not present

## 2018-10-21 MED ORDER — VITAMIN D (ERGOCALCIFEROL) 1.25 MG (50000 UNIT) PO CAPS: 50000.0000 [IU] | ORAL_CAPSULE | ORAL | 0 refills | Status: DC

## 2018-10-21 MED FILL — VIT D2 1.25 MG (50,000 UNIT: 1.25 MG | 28 days supply | Qty: 4 | Fill #0

## 2018-10-25 NOTE — Progress Notes (Signed)
Office: [phone]  /  Fax: [phone]   HPI:   Chief Complaint: OBESITY Treniyah is here to discuss her progress with her obesity treatment plan. She is on the Category 3 plan and is following her eating plan approximately 80 % of the time. She states she is exercising at the gym for 30 minutes 4 to 5 times per week. Evelina did well with maintaining her weight over the holidays. She reports that she has been following the plan and is frustrated with the lack of weight loss. Her weight is (!) 417 lb (189.1 kg) today and she has maintained weight over a period of 3 weeks since her last visit. She has lost 51 lbs since starting treatment with [name].  Vitamin D deficiency Lyanna has a diagnosis of vitamin D deficiency. She is currently taking vit D and denies nausea, vomiting or muscle weakness.  At risk for osteopenia and osteoporosis Kryslynn is at higher risk of osteopenia and osteoporosis due to vitamin D deficiency.   Insulin Resistance Malayla has a diagnosis of insulin resistance based on her elevated fasting insulin level >5. Although Tolulope's blood glucose readings are still under good control, insulin resistance puts her at greater risk of metabolic syndrome and diabetes. She is not taking metformin currently and continues to work on diet and exercise to decrease risk of diabetes. Tashay denies any nausea, vomiting or diarrhea. She denies polyphagia.  ASSESSMENT AND PLAN:  Vitamin D deficiency - Plan: Vitamin D, Ergocalciferol, (DRISDOL) 1.25 MG (50000 UT) CAPS capsule  Insulin resistance  At risk for osteoporosis  Class 3 severe obesity with serious comorbidity and body mass index (BMI) of 60.0 to 69.9 in adult, unspecified obesity type (HCC)  PLAN:  Vitamin D Deficiency Carlota was informed that low vitamin D levels contributes to fatigue and are associated with obesity, breast, and colon cancer. She agrees to continue to take prescription Vit D @50 ,000 IU every week  #4 with no refills and will follow up for routine testing of vitamin D, at least 2-3 times per year. She was informed of the risk of over-replacement of vitamin D and agrees to not increase her dose unless she discusses this with [name] first. Carneshia agrees to follow up as directed.  At risk for osteopenia and osteoporosis Vitalia was given extended  (15 minutes) osteoporosis prevention counseling today. Aika is at risk for osteopenia and osteoporosis due to her vitamin D deficiency. She was encouraged to take her vitamin D and follow her higher calcium diet and increase strengthening exercise to help strengthen her bones and decrease her risk of osteopenia and osteoporosis.  Insulin Resistance Estelene will continue to work on weight loss, exercise, and decreasing simple carbohydrates in her diet to help decrease the risk of diabetes. She was informed that eating too many simple carbohydrates or too many calories at one sitting increases the likelihood of GI side effects. Sharlot agreed to follow up with [name] as directed to monitor her progress.  Obesity Graelyn is currently in the action stage of change. As such, her goal is to continue with weight loss efforts She has agreed to follow the Category 3 plan Niesha has been instructed to work up to a goal of 150 minutes of combined cardio and strengthening exercise per week for weight loss and overall health benefits. We discussed the following Behavioral Modification Strategies today: better snacking choices and work on meal planning and easy cooking plans  Carlaysia has agreed to follow up with our clinic in  2 weeks. We will check indirect calorimetry at the next visit. She was informed of the importance of frequent follow up visits to maximize her success with intensive lifestyle modifications for her multiple health conditions.  ALLERGIES: Allergies  Allergen Reactions  . Citrus Swelling    MEDICATIONS: Current Outpatient Medications on File  Prior to Visit  Medication Sig Dispense Refill  . aspirin-acetaminophen-caffeine (EXCEDRIN MIGRAINE) 250-250-65 MG per tablet Take 2 tablets by mouth 2 (two) times daily as needed for migraine. For headache     . budesonide (PULMICORT) 180 MCG/ACT inhaler Inhale 1 puff into the lungs 2 (two) times daily. 1 Inhaler 1  . buPROPion (WELLBUTRIN SR) 200 MG 12 hr tablet Take 1 tablet (200 mg total) by mouth daily at 12 noon. 30 tablet 0  . cetirizine (ZYRTEC) 10 MG tablet Take 10 mg by mouth daily.    . diclofenac (CATAFLAM) 50 MG tablet Take 50 mg by mouth 2 (two) times daily at 10 AM and 5 PM.    . diclofenac sodium (VOLTAREN) 1 % GEL Apply 2 g topically 4 (four) times daily. 100 g 1  . famotidine (PEPCID) 10 MG tablet Take 1 tablet (10 mg total) by mouth daily. 30 tablet 6  . furosemide (LASIX) 20 MG tablet Take 1 tablet (20 mg total) by mouth 2 (two) times daily. 60 tablet 3  . gabapentin (NEURONTIN) 300 MG capsule TAKE 2 CAPSULES (600 MG TOTAL) BY MOUTH 3 TIMES A DAY 180 capsule 3  . ibuprofen (ADVIL,MOTRIN) 200 MG tablet Take 600-800 mg by mouth every 8 (eight) hours as needed (for pain.).     . Insulin Pen Needle (BD PEN NEEDLE NANO U/F) 32G X 4 MM MISC 1 Package by Does not apply route daily at 12 noon. 100 each 0  . Liraglutide -Weight Management (SAXENDA) 18 MG/3ML SOPN Inject 3 mg into the skin daily. 5 pen 0  . meloxicam (MOBIC) 15 MG tablet Take 1 tablet (15 mg total) by mouth daily. 30 tablet 0  . polyethylene glycol powder (GLYCOLAX/MIRALAX) powder Take 17 g by mouth daily as needed. 3350 g 1   No current facility-administered medications on file prior to visit.     PAST MEDICAL HISTORY: Past Medical History:  Diagnosis Date  . Allergy   . Anxiety   . Arthritis   . Asthma    since baby, seasonal  . ASTHMA, UNSPECIFIED 12/10/2006   Qualifier: Diagnosis of  By: Abundio Miu    . CELLULITIS AND ABSCESS OF LEG EXCEPT FOOT 12/30/2007   Qualifier: Diagnosis of  By: Daphine Deutscher FNP,  Zena Amos    . Cyst of bone    right side of spine  . DEPENDENT EDEMA, LEGS, BILATERAL 11/22/2008   Qualifier: Diagnosis of  By: Barbaraann Barthel MD, [place]    . Depression 2003Dx  . Edema   . Food allergy    citris  . FOOT PAIN, RIGHT 11/22/2008   Qualifier: Diagnosis of  By: Barbaraann Barthel MD, [place]    . Gastric ulcer   . GERD (gastroesophageal reflux disease)   . Gout   . Hypertension    took Norvasc for 3 months, doctor discontinued no longer on BP medications  . Joint pain   . KNEE INJURY, LEFT 01/01/2009   Qualifier: Diagnosis of  By: Delrae Alfred MD, Lanora Manis    . Migraine   . ONYCHOMYCOSIS, TOENAILS 11/22/2008   Qualifier: Diagnosis of  By: Barbaraann Barthel MD, [place]    . Shortness of breath   . Sleep  apnea   . TINEA PEDIS 11/22/2008   Qualifier: Diagnosis of  By: Barbaraann Barthel MD, Benetta Spar      PAST SURGICAL HISTORY: Past Surgical History:  Procedure Laterality Date  . DILITATION & CURRETTAGE/HYSTROSCOPY WITH NOVASURE ABLATION N/A 07/03/2017   Procedure: DILATATION & CURETTAGE/HYSTEROSCOPY WITH NOVASURE ABLATION;  Surgeon: Genia Del, MD;  Location: [facility];  Service: Gynecology;  Laterality: N/A;  . UPPER GI ENDOSCOPY    . WISDOM TOOTH EXTRACTION  2008   x4    SOCIAL HISTORY: Social History   Tobacco Use  . Smoking status: Former Smoker    Packs/day: 0.50    Years: 18.00    Pack years: 9.00    Types: Cigarettes    Start date: 10/13/1996    Last attempt to quit: 01/12/2015    Years since quitting: 3.7  . Smokeless tobacco: Never Used  Substance Use Topics  . Alcohol use: Yes    Alcohol/week: 1.0 standard drinks    Types: 1 Standard drinks or equivalent per week    Comment: rarely drinks  . Drug use: No    Comment: marjuana - former use, quit 4 years ago    FAMILY HISTORY: Family History  Problem Relation Age of Onset  . Hypertension Mother   . Hyperlipidemia Mother   . Cancer Mother        bone cancer  . Heart disease Mother   . Sudden death Mother   . Stroke Mother     . Thyroid disease Mother   . Sleep apnea Mother   . Obesity Mother   . Sudden death Father   . Hypertension Father   . Heart disease Father   . Hyperlipidemia Father   . Prostate cancer Father   . Lung cancer Father   . Obesity Father   . Alcoholism Father   . Sudden death Brother   . Hypertension Brother   . Hyperlipidemia Brother   . Heart attack Brother   . Heart disease Brother   . Stroke Brother   . Alcoholism Brother   . Drug abuse Brother   . Heart disease Sister   . Hypertension Sister   . Thyroid cancer Sister   . Diabetes Neg Hx   . Colon cancer Neg Hx   . Stomach cancer Neg Hx   . Esophageal cancer Neg Hx   . Rectal cancer Neg Hx   . Liver cancer Neg Hx     ROS: Review of Systems  Constitutional: Negative for weight loss.  Gastrointestinal: Negative for diarrhea, nausea and vomiting.  Musculoskeletal:       Negative for muscle weakness  Endo/Heme/Allergies:       Negative for polyphagia    PHYSICAL EXAM: Blood pressure 101/68, pulse 84, temperature 98 F (36.7 C), temperature source Oral, height 5\' 7"  (1.702 m), weight (!) 417 lb (189.1 kg), SpO2 100 %. Body mass index is 65.31 kg/m. Physical Exam Vitals signs reviewed.  Constitutional:      Appearance: Normal appearance. She is well-developed. She is obese.  Cardiovascular:     Rate and Rhythm: Normal rate.  Pulmonary:     Effort: Pulmonary effort is normal.  Musculoskeletal: Normal range of motion.  Skin:    General: Skin is warm and dry.  Neurological:     Mental Status: She is alert and oriented to person, place, and time.  Psychiatric:        Mood and Affect: Mood normal.        Behavior: Behavior normal.  RECENT LABS AND TESTS: BMET    Component Value Date/Time   NA 140 09/27/2018 1602   K 4.7 09/27/2018 1602   CL 100 09/27/2018 1602   CO2 24 09/27/2018 1602   GLUCOSE 73 09/27/2018 1602   GLUCOSE 83 06/04/2016 1155   BUN 16 09/27/2018 1602   CREATININE 1.18 (H)  09/27/2018 1602   CREATININE 0.90 06/04/2016 1155   CALCIUM 9.2 09/27/2018 1602   GFRNONAA 59 (L) 09/27/2018 1602   GFRNONAA 84 06/04/2016 1155   GFRAA 69 09/27/2018 1602   GFRAA >89 06/04/2016 1155   Lab Results  Component Value Date   HGBA1C 5.5 09/28/2018   HGBA1C 5.4 05/24/2018   HGBA1C 5.3 12/10/2017   HGBA1C 5.4 06/25/2017   HGBA1C 5.2 05/19/2017   Lab Results  Component Value Date   INSULIN 29.7 (H) 09/28/2018   INSULIN 28.3 (H) 05/24/2018   INSULIN 43.6 (H) 12/10/2017   INSULIN 30.4 (H) 06/25/2017   INSULIN 29.7 (H) 01/20/2017   CBC    Component Value Date/Time   WBC 10.6 12/10/2017 1242   WBC 13.3 (H) 07/03/2017 0610   RBC 4.64 12/10/2017 1242   RBC 4.33 07/03/2017 0610   HGB 12.5 12/10/2017 1242   HCT 38.4 12/10/2017 1242   PLT 293 07/03/2017 0610   PLT 343 05/18/2017 1611   MCV 83 12/10/2017 1242   MCH 26.9 12/10/2017 1242   MCH 25.9 (L) 07/03/2017 0610   MCHC 32.6 12/10/2017 1242   MCHC 32.4 07/03/2017 0610   RDW 16.2 (H) 12/10/2017 1242   LYMPHSABS 5.2 (H) 12/10/2017 1242   MONOABS 0.6 07/03/2017 0610   EOSABS 0.0 12/10/2017 1242   BASOSABS 0.0 12/10/2017 1242   Iron/TIBC/Ferritin/ %Sat    Component Value Date/Time   IRON 25 (L) 05/18/2017 1611   TIBC 363 05/18/2017 1611   FERRITIN 16 05/18/2017 1611   IRONPCTSAT 7 (LL) 05/18/2017 1611   Lipid Panel     Component Value Date/Time   CHOL 146 05/24/2018 1044   TRIG 53 05/24/2018 1044   HDL 41 05/24/2018 1044   CHOLHDL 3.6 11/21/2015 1047   VLDL 10 11/21/2015 1047   LDLCALC 94 05/24/2018 1044   Hepatic Function Panel     Component Value Date/Time   PROT 7.3 05/24/2018 1044   ALBUMIN 4.1 05/24/2018 1044   AST 29 05/24/2018 1044   ALT 24 05/24/2018 1044   ALKPHOS 70 05/24/2018 1044   BILITOT 0.3 05/24/2018 1044      Component Value Date/Time   TSH 1.43 05/19/2017 1245   TSH 1.460 10/27/2016 1058   TSH 0.96 11/21/2015 1047     Ref. Range 09/28/2018 09:09  Vitamin D, 25-Hydroxy  Latest Ref Range: 30.0 - 100.0 ng/mL 25.6 (L)     OBESITY BEHAVIORAL INTERVENTION VISIT  Today's visit was # 44  Starting weight: 468 lbs Starting date: 10/27/2016 Today's weight : 417 lbs Today's date: 10/21/2018 Total lbs lost to date: 4751   ASK: We discussed the diagnosis of obesity with Heywood IlesJansala N Larner today and [name] agreed to give us permission to discuss obesity behavioral modification therapy today.  ASSESS: [name] has the diagnosis of obesity and her BMI today is 65.3 Rona is in the action stage of change   ADVISE: [name] was educated on the multiple health risks of obesity as well as the benefit of weight loss to improve her health. She was advised of the need for long term treatment and the importance of lifestyle modifications to improve her current  health and to decrease her risk of future health problems.  AGREE: Multiple dietary modification options and treatment options were discussed and  Zuriel agreed to follow the recommendations documented in the above note.  ARRANGE: [name] was educated on the importance of frequent visits to treat obesity as outlined per CMS and USPSTF guidelines and agreed to schedule her next follow up appointment today.  Cristi LoronI, Joanne Murray, am acting as transcriptionist for Alois Clicheracey Harrison Paulson, PA-C I, Alois Clicheracey Alejandra Hunt, PA-C have reviewed above note and agree with its content

## 2018-10-26 MED FILL — GABAPENTIN 300 MG CAPSULE: 300 | 30 days supply | Qty: 180 | Fill #2

## 2018-11-03 ENCOUNTER — Ambulatory Visit (INDEPENDENT_AMBULATORY_CARE_PROVIDER_SITE_OTHER): Payer: 59 | Admitting: Physician Assistant

## 2018-11-03 ENCOUNTER — Encounter (INDEPENDENT_AMBULATORY_CARE_PROVIDER_SITE_OTHER): Payer: Self-pay | Admitting: Physician Assistant

## 2018-11-03 VITALS — BP 133/86 | HR 85 | Temp 98.1°F | Ht 67.0 in | Wt >= 6400 oz

## 2018-11-03 DIAGNOSIS — Z6841 Body Mass Index (BMI) 40.0 and over, adult: Secondary | ICD-10-CM

## 2018-11-03 DIAGNOSIS — E559 Vitamin D deficiency, unspecified: Secondary | ICD-10-CM

## 2018-11-04 NOTE — Progress Notes (Signed)
Office: [phone]  /  Fax: [phone]   HPI:   Chief Complaint: OBESITY Catherine [name] is here to discuss her progress with her obesity treatment plan. She is on the Category 3 plan and is following her eating plan approximately 90 % of the time. She states she is going to the gym 25 minutes 4 times per week. Catherine [name] did well with weight loss. She reports following the plan closely, despite her having bad reflux over the weekend Her weight is (!) 416 lb (188.7 kg) today and has had a weight loss of 1 pounds over a period of 6 weeks since her last visit. She has lost 52 lbs since starting treatment with us.  Vitamin D deficiency Catherine [name] has a diagnosis of vitamin D deficiency. She is currently taking prescription Vit D and denies nausea, vomiting or muscle weakness.  ASSESSMENT AND PLAN:  Vitamin D deficiency  Class 3 severe obesity with serious comorbidity and body mass index (BMI) of 60.0 to 69.9 in adult, unspecified obesity type (HCC)  PLAN:  Vitamin D Deficiency Catherine [name] was informed that low vitamin D levels contributes to fatigue and are associated with obesity, breast, and colon cancer. She agrees to continue to take prescription Vit D @50 ,000 IU every week and will follow up for routine testing of vitamin D, at least 2-3 times per year. She was informed of the risk of over-replacement of vitamin D and agrees to not increase her dose unless she discusses this with us first. Catherine [name] agrees to follow up with our clinic in 2 weeks.  I spent > than 50% of the 15 minute visit on counseling as documented in the note.  Obesity Catherine [name] is currently in the action stage of change. As such, her goal is to continue with weight loss efforts She has agreed to change to the Category 4 plan Catherine [name] has been instructed to work up to a goal of 150 minutes of combined cardio and strengthening exercise per week for weight loss and overall health benefits. We discussed the following Behavioral  Modification Strategies today: work on meal planning and easy cooking plans and planning for success  Catherine [name] has agreed to follow up with our clinic in 2 weeks. She was informed of the importance of frequent follow up visits to maximize her success with intensive lifestyle modifications for her multiple health conditions.  ALLERGIES: Allergies  Allergen Reactions  . Citrus Swelling    MEDICATIONS: Current Outpatient Medications on File Prior to Visit  Medication Sig Dispense Refill  . aspirin-acetaminophen-caffeine (EXCEDRIN MIGRAINE) 250-250-65 MG per tablet Take 2 tablets by mouth 2 (two) times daily as needed for migraine. For headache     . budesonide (PULMICORT) 180 MCG/ACT inhaler Inhale 1 puff into the lungs 2 (two) times daily. 1 Inhaler 1  . buPROPion (WELLBUTRIN SR) 200 MG 12 hr tablet Take 1 tablet (200 mg total) by mouth daily at 12 noon. 30 tablet 0  . cetirizine (ZYRTEC) 10 MG tablet Take 10 mg by mouth daily.    . diclofenac (CATAFLAM) 50 MG tablet Take 50 mg by mouth 2 (two) times daily at 10 AM and 5 PM.    . diclofenac sodium (VOLTAREN) 1 % GEL Apply 2 g topically 4 (four) times daily. 100 g 1  . famotidine (PEPCID) 10 MG tablet Take 1 tablet (10 mg total) by mouth daily. 30 tablet 6  . furosemide (LASIX) 20 MG tablet Take 1 tablet (20 mg total) by mouth 2 (two) times daily. 60 tablet  3  . gabapentin (NEURONTIN) 300 MG capsule TAKE 2 CAPSULES (600 MG TOTAL) BY MOUTH 3 TIMES A DAY 180 capsule 3  . ibuprofen (ADVIL,MOTRIN) 200 MG tablet Take 600-800 mg by mouth every 8 (eight) hours as needed (for pain.).     . Insulin Pen Needle (BD PEN NEEDLE NANO U/F) 32G X 4 MM MISC 1 Package by Does not apply route daily at 12 noon. 100 each 0  . Liraglutide -Weight Management (SAXENDA) 18 MG/3ML SOPN Inject 3 mg into the skin daily. 5 pen 0  . meloxicam (MOBIC) 15 MG tablet Take 1 tablet (15 mg total) by mouth daily. 30 tablet 0  . polyethylene glycol powder (GLYCOLAX/MIRALAX) powder  Take 17 g by mouth daily as needed. 3350 g 1  . Vitamin D, Ergocalciferol, (DRISDOL) 1.25 MG (50000 UT) CAPS capsule Take 1 capsule (50,000 Units total) by mouth every 7 (seven) days. 4 capsule 0   No current facility-administered medications on file prior to visit.     PAST MEDICAL HISTORY: Past Medical History:  Diagnosis Date  . Allergy   . Anxiety   . Arthritis   . Asthma    since baby, seasonal  . ASTHMA, UNSPECIFIED 12/10/2006   Qualifier: Diagnosis of  By: Abundio Miu    . CELLULITIS AND ABSCESS OF LEG EXCEPT FOOT 12/30/2007   Qualifier: Diagnosis of  By: Daphine Deutscher FNP, Zena Amos    . Cyst of bone    right side of spine  . DEPENDENT EDEMA, LEGS, BILATERAL 11/22/2008   Qualifier: Diagnosis of  By: Barbaraann Barthel MD, [place]    . Depression 2003Dx  . Edema   . Food allergy    citris  . FOOT PAIN, RIGHT 11/22/2008   Qualifier: Diagnosis of  By: Barbaraann Barthel MD, [place]    . Gastric ulcer   . GERD (gastroesophageal reflux disease)   . Gout   . Hypertension    took Norvasc for 3 months, doctor discontinued no longer on BP medications  . Joint pain   . KNEE INJURY, LEFT 01/01/2009   Qualifier: Diagnosis of  By: Delrae Alfred MD, Lanora Manis    . Migraine   . ONYCHOMYCOSIS, TOENAILS 11/22/2008   Qualifier: Diagnosis of  By: Barbaraann Barthel MD, [place]    . Shortness of breath   . Sleep apnea   . TINEA PEDIS 11/22/2008   Qualifier: Diagnosis of  By: Barbaraann Barthel MD, Benetta Spar      PAST SURGICAL HISTORY: Past Surgical History:  Procedure Laterality Date  . DILITATION & CURRETTAGE/HYSTROSCOPY WITH NOVASURE ABLATION N/A 07/03/2017   Procedure: DILATATION & CURETTAGE/HYSTEROSCOPY WITH NOVASURE ABLATION;  Surgeon: Genia Del, MD;  Location: [facility];  Service: Gynecology;  Laterality: N/A;  . UPPER GI ENDOSCOPY    . WISDOM TOOTH EXTRACTION  2008   x4    SOCIAL HISTORY: Social History   Tobacco Use  . Smoking status: Former Smoker    Packs/day: 0.50    Years: 18.00    Pack years: 9.00     Types: Cigarettes    Start date: 10/13/1996    Last attempt to quit: 01/12/2015    Years since quitting: 3.8  . Smokeless tobacco: Never Used  Substance Use Topics  . Alcohol use: Yes    Alcohol/week: 1.0 standard drinks    Types: 1 Standard drinks or equivalent per week    Comment: rarely drinks  . Drug use: No    Comment: marjuana - former use, quit 4 years ago    FAMILY HISTORY: Family History  Problem Relation Age of Onset  . Hypertension Mother   . Hyperlipidemia Mother   . Cancer Mother        bone cancer  . Heart disease Mother   . Sudden death Mother   . Stroke Mother   . Thyroid disease Mother   . Sleep apnea Mother   . Obesity Mother   . Sudden death Father   . Hypertension Father   . Heart disease Father   . Hyperlipidemia Father   . Prostate cancer Father   . Lung cancer Father   . Obesity Father   . Alcoholism Father   . Sudden death Brother   . Hypertension Brother   . Hyperlipidemia Brother   . Heart attack Brother   . Heart disease Brother   . Stroke Brother   . Alcoholism Brother   . Drug abuse Brother   . Heart disease Sister   . Hypertension Sister   . Thyroid cancer Sister   . Diabetes Neg Hx   . Colon cancer Neg Hx   . Stomach cancer Neg Hx   . Esophageal cancer Neg Hx   . Rectal cancer Neg Hx   . Liver cancer Neg Hx     ROS: Review of Systems  Constitutional: Positive for weight loss.  Gastrointestinal: Negative for nausea and vomiting.  Musculoskeletal:       Negative for muscle weakness    PHYSICAL EXAM: Blood pressure 133/86, pulse 85, temperature 98.1 F (36.7 C), temperature source Oral, height 5\' 7"  (1.702 m), weight (!) 416 lb (188.7 kg), SpO2 98 %. Body mass index is 65.15 kg/m. Physical Exam Vitals signs reviewed.  Constitutional:      Appearance: Normal appearance. She is obese.  Cardiovascular:     Rate and Rhythm: Normal rate.     Pulses: Normal pulses.  Pulmonary:     Effort: Pulmonary effort is normal.    Musculoskeletal: Normal range of motion.  Skin:    General: Skin is warm and dry.  Neurological:     Mental Status: She is alert and oriented to person, place, and time.  Psychiatric:        Mood and Affect: Mood normal.        Behavior: Behavior normal.     RECENT LABS AND TESTS: BMET    Component Value Date/Time   NA 140 09/27/2018 1602   K 4.7 09/27/2018 1602   CL 100 09/27/2018 1602   CO2 24 09/27/2018 1602   GLUCOSE 73 09/27/2018 1602   GLUCOSE 83 06/04/2016 1155   BUN 16 09/27/2018 1602   CREATININE 1.18 (H) 09/27/2018 1602   CREATININE 0.90 06/04/2016 1155   CALCIUM 9.2 09/27/2018 1602   GFRNONAA 59 (L) 09/27/2018 1602   GFRNONAA 84 06/04/2016 1155   GFRAA 69 09/27/2018 1602   GFRAA >89 06/04/2016 1155   Lab Results  Component Value Date   HGBA1C 5.5 09/28/2018   HGBA1C 5.4 05/24/2018   HGBA1C 5.3 12/10/2017   HGBA1C 5.4 06/25/2017   HGBA1C 5.2 05/19/2017   Lab Results  Component Value Date   INSULIN 29.7 (H) 09/28/2018   INSULIN 28.3 (H) 05/24/2018   INSULIN 43.6 (H) 12/10/2017   INSULIN 30.4 (H) 06/25/2017   INSULIN 29.7 (H) 01/20/2017   CBC    Component Value Date/Time   WBC 10.6 12/10/2017 1242   WBC 13.3 (H) 07/03/2017 0610   RBC 4.64 12/10/2017 1242   RBC 4.33 07/03/2017 0610   HGB 12.5 12/10/2017 1242  HCT 38.4 12/10/2017 1242   PLT 293 07/03/2017 0610   PLT 343 05/18/2017 1611   MCV 83 12/10/2017 1242   MCH 26.9 12/10/2017 1242   MCH 25.9 (L) 07/03/2017 0610   MCHC 32.6 12/10/2017 1242   MCHC 32.4 07/03/2017 0610   RDW 16.2 (H) 12/10/2017 1242   LYMPHSABS 5.2 (H) 12/10/2017 1242   MONOABS 0.6 07/03/2017 0610   EOSABS 0.0 12/10/2017 1242   BASOSABS 0.0 12/10/2017 1242   Iron/TIBC/Ferritin/ %Sat    Component Value Date/Time   IRON 25 (L) 05/18/2017 1611   TIBC 363 05/18/2017 1611   FERRITIN 16 05/18/2017 1611   IRONPCTSAT 7 (LL) 05/18/2017 1611   Lipid Panel     Component Value Date/Time   CHOL 146 05/24/2018 1044   TRIG  53 05/24/2018 1044   HDL 41 05/24/2018 1044   CHOLHDL 3.6 11/21/2015 1047   VLDL 10 11/21/2015 1047   LDLCALC 94 05/24/2018 1044   Hepatic Function Panel     Component Value Date/Time   PROT 7.3 05/24/2018 1044   ALBUMIN 4.1 05/24/2018 1044   AST 29 05/24/2018 1044   ALT 24 05/24/2018 1044   ALKPHOS 70 05/24/2018 1044   BILITOT 0.3 05/24/2018 1044      Component Value Date/Time   TSH 1.43 05/19/2017 1245   TSH 1.460 10/27/2016 1058   TSH 0.96 11/21/2015 1047     Ref. Range 09/28/2018 09:09  Vitamin D, 25-Hydroxy Latest Ref Range: 30.0 - 100.0 ng/mL 25.6 (L)     OBESITY BEHAVIORAL INTERVENTION VISIT  Today's visit was # 45  Starting weight: 468 lbs Starting date: 10/27/2016 Today's weight : (!) 416 lbs Today's date: 11/03/2018 Total lbs lost to date: 76  ASK: We discussed the diagnosis of obesity with Catherine [name] today and Catherine [name] agreed to give [name] permission to discuss obesity behavioral modification therapy today.  ASSESS: Catherine [name] has the diagnosis of obesity and her BMI today is 65.14 Catherine [name] is in the action stage of change   ADVISE: Catherine [name] was educated on the multiple health risks of obesity as well as the benefit of weight loss to improve her health. She was advised of the need for long term treatment and the importance of lifestyle modifications to improve her current health and to decrease her risk of future health problems.  AGREE: Multiple dietary modification options and treatment options were discussed and  Catherine [name] agreed to follow the recommendations documented in the above note.  ARRANGE: Catherine [name] was educated on the importance of frequent visits to treat obesity as outlined per CMS and USPSTF guidelines and agreed to schedule her next follow up appointment today.  I, Tammy Wysor, am acting as Energy manager for [employer] I, Alois Cliche, PA-C have reviewed above note and agree with its content

## 2018-11-08 DIAGNOSIS — G4733 Obstructive sleep apnea (adult) (pediatric): Secondary | ICD-10-CM | POA: Diagnosis not present

## 2018-11-15 ENCOUNTER — Telehealth: Payer: Self-pay | Admitting: Family Medicine

## 2018-11-15 MED FILL — FUROSEMIDE 20 MG TABS: 20 | 30 days supply | Qty: 60 | Fill #3

## 2018-11-15 NOTE — Telephone Encounter (Signed)
Reviewed handicap parking placard application and placed in PCP's box for completion.  Glennie Hawk, CMA

## 2018-11-15 NOTE — Telephone Encounter (Signed)
Handicap parking form dropped off at front desk for completion.  Verified that patient section of form has been completed.  Last DOS/WCC with PCP was 08/20/2018.  Placed form in team folder to be completed by clinical staff.  Catherine [name]

## 2018-11-18 ENCOUNTER — Encounter (INDEPENDENT_AMBULATORY_CARE_PROVIDER_SITE_OTHER): Payer: Self-pay

## 2018-11-18 ENCOUNTER — Ambulatory Visit (INDEPENDENT_AMBULATORY_CARE_PROVIDER_SITE_OTHER): Payer: 59 | Admitting: Physician Assistant

## 2018-11-22 NOTE — Telephone Encounter (Signed)
Called and LVM for patient informing her that handicapped placard application is ready for pickup.  Glennie Hawk, CMA

## 2018-11-23 ENCOUNTER — Encounter (INDEPENDENT_AMBULATORY_CARE_PROVIDER_SITE_OTHER): Payer: Self-pay | Admitting: Physician Assistant

## 2018-11-23 ENCOUNTER — Ambulatory Visit (INDEPENDENT_AMBULATORY_CARE_PROVIDER_SITE_OTHER): Payer: Commercial Managed Care - PPO | Admitting: Physician Assistant

## 2018-11-23 VITALS — BP 110/71 | HR 94 | Temp 97.9°F | Ht 67.0 in | Wt >= 6400 oz

## 2018-11-23 DIAGNOSIS — I10 Essential (primary) hypertension: Secondary | ICD-10-CM

## 2018-11-23 DIAGNOSIS — Z6841 Body Mass Index (BMI) 40.0 and over, adult: Secondary | ICD-10-CM

## 2018-11-23 NOTE — Progress Notes (Incomplete)
Office: [phone]  /  Fax: [phone]   HPI:   Chief Complaint: OBESITY Catherine [name] is here to discuss her progress with her obesity treatment plan. She is on the Category 4 plan and is following her eating plan approximately 85% of the time. She states she is doing cardio 20 minutes 3 times per week. Kalisa did well with weight loss. She reports being able to get all of her food in on the plan. She has a birthday coming up. Her weight is (!) 413 lb (187.3 kg) today and has had a weight loss of 3 pounds over a period of 3 weeks since her last visit. She has lost 55 lbs since starting treatment with us.  Hypertension Catherine [name] is a 38 y.o. female with hypertension. Catherine [name] is currently taking furosemide. Catherine [name] denies chest pain. She is working weight loss to help control her blood pressure with the goal of decreasing her risk of heart attack and stroke. [name] blood pressure is currently controlled.  ASSESSMENT AND PLAN:  Essential hypertension  Class 3 severe obesity with serious comorbidity and body mass index (BMI) of 50.0 to 59.9 in adult, unspecified obesity type (HCC)  PLAN:  Hypertension We discussed sodium restriction, working on healthy weight loss, and a regular exercise program as the means to achieve improved blood pressure control. Catherine [name] agreed with this plan and agreed to follow up as directed. We will continue to monitor her blood pressure as well as her progress with the above lifestyle modifications. She will continue her medications as prescribed and will watch for signs of hypotension as she continues her lifestyle modifications.  I spent > than 50% of the 15 minute visit on counseling as documented in the note.  Obesity Catherine [name] is currently in the action stage of change. As such, her goal is to continue with weight loss efforts. She has agreed to follow the Category 4 plan. Catherine [name] has been instructed to work up to a goal of 150 minutes of  combined cardio and strengthening exercise per week for weight loss and overall health benefits. We discussed the following Behavioral Modification Strategies today: work on meal planning and easy cooking plans and ways to avoid night time snacking.  Catherine [name] has agreed to follow up with our clinic in 2 weeks. She was informed of the importance of frequent follow up visits to maximize her success with intensive lifestyle modifications for her multiple health conditions.  ALLERGIES: Allergies  Allergen Reactions   Citrus Swelling    MEDICATIONS: Current Outpatient Medications on File Prior to Visit  Medication Sig Dispense Refill   aspirin-acetaminophen-caffeine (EXCEDRIN MIGRAINE) 250-250-65 MG per tablet Take 2 tablets by mouth 2 (two) times daily as needed for migraine. For headache      budesonide (PULMICORT) 180 MCG/ACT inhaler Inhale 1 puff into the lungs 2 (two) times daily. 1 Inhaler 1   buPROPion (WELLBUTRIN SR) 200 MG 12 hr tablet Take 1 tablet (200 mg total) by mouth daily at 12 noon. 30 tablet 0   cetirizine (ZYRTEC) 10 MG tablet Take 10 mg by mouth daily.     diclofenac (CATAFLAM) 50 MG tablet Take 50 mg by mouth 2 (two) times daily at 10 AM and 5 PM.     diclofenac sodium (VOLTAREN) 1 % GEL Apply 2 g topically 4 (four) times daily. 100 g 1   famotidine (PEPCID) 10 MG tablet Take 1 tablet (10 mg total) by mouth daily. 30 tablet 6   furosemide (LASIX) 20  MG tablet Take 1 tablet (20 mg total) by mouth 2 (two) times daily. 60 tablet 3   gabapentin (NEURONTIN) 300 MG capsule TAKE 2 CAPSULES (600 MG TOTAL) BY MOUTH 3 TIMES A DAY 180 capsule 3   ibuprofen (ADVIL,MOTRIN) 200 MG tablet Take 600-800 mg by mouth every 8 (eight) hours as needed (for pain.).      Insulin Pen Needle (BD PEN NEEDLE NANO U/F) 32G X 4 MM MISC 1 Package by Does not apply route daily at 12 noon. 100 each 0   Liraglutide -Weight Management (SAXENDA) 18 MG/3ML SOPN Inject 3 mg into the skin daily. 5 pen  0   meloxicam (MOBIC) 15 MG tablet Take 1 tablet (15 mg total) by mouth daily. 30 tablet 0   polyethylene glycol powder (GLYCOLAX/MIRALAX) powder Take 17 g by mouth daily as needed. 3350 g 1   Vitamin D, Ergocalciferol, (DRISDOL) 1.25 MG (50000 UT) CAPS capsule Take 1 capsule (50,000 Units total) by mouth every 7 (seven) days. 4 capsule 0   No current facility-administered medications on file prior to visit.     PAST MEDICAL HISTORY: Past Medical History:  Diagnosis Date   Allergy    Anxiety    Arthritis    Asthma    since baby, seasonal   ASTHMA, UNSPECIFIED 12/10/2006   Qualifier: Diagnosis of  By: Abundio Miu     CELLULITIS AND ABSCESS OF LEG EXCEPT FOOT 12/30/2007   Qualifier: Diagnosis of  By: Daphine Deutscher FNP, [place]     Cyst of bone    right side of spine   DEPENDENT EDEMA, LEGS, BILATERAL 11/22/2008   Qualifier: Diagnosis of  By: Barbaraann Barthel MD, [place]     Depression 2003Dx   Edema    Food allergy    citris   FOOT PAIN, RIGHT 11/22/2008   Qualifier: Diagnosis of  By: Barbaraann Barthel MD, [place]     Gastric ulcer    GERD (gastroesophageal reflux disease)    Gout    Hypertension    took Norvasc for 3 months, doctor discontinued no longer on BP medications   Joint pain    KNEE INJURY, LEFT 01/01/2009   Qualifier: Diagnosis of  By: Delrae Alfred MD, [place]     Migraine    ONYCHOMYCOSIS, TOENAILS 11/22/2008   Qualifier: Diagnosis of  By: Barbaraann Barthel MD, [place]     Shortness of breath    Sleep apnea    TINEA PEDIS 11/22/2008   Qualifier: Diagnosis of  By: Barbaraann Barthel MD, Benetta Spar      PAST SURGICAL HISTORY: Past Surgical History:  Procedure Laterality Date   DILITATION & CURRETTAGE/HYSTROSCOPY WITH NOVASURE ABLATION [name]/A 07/03/2017   Procedure: DILATATION & CURETTAGE/HYSTEROSCOPY WITH NOVASURE ABLATION;  Surgeon: Genia Del, MD;  Location: [facility];  Service: Gynecology;  Laterality: [name]/A;   UPPER GI ENDOSCOPY     WISDOM TOOTH EXTRACTION  2008   x4     SOCIAL HISTORY: Social History   Tobacco Use   Smoking status: Former Smoker    Packs/day: 0.50    Years: 18.00    Pack years: 9.00    Types: Cigarettes    Start date: 10/13/1996    Last attempt to quit: 01/12/2015    Years since quitting: 3.8   Smokeless tobacco: Never Used  Substance Use Topics   Alcohol use: Yes    Alcohol/week: 1.0 standard drinks    Types: 1 Standard drinks or equivalent per week    Comment: rarely drinks   Drug use: No  Comment: marjuana - former use, quit 4 years ago    FAMILY HISTORY: Family History  Problem Relation Age of Onset   Hypertension Mother    Hyperlipidemia Mother    Cancer Mother        bone cancer   Heart disease Mother    Sudden death Mother    Stroke Mother    Thyroid disease Mother    Sleep apnea Mother    Obesity Mother    Sudden death Father    Hypertension Father    Heart disease Father    Hyperlipidemia Father    Prostate cancer Father    Lung cancer Father    Obesity Father    Alcoholism Father    Sudden death Brother    Hypertension Brother    Hyperlipidemia Brother    Heart attack Brother    Heart disease Brother    Stroke Brother    Alcoholism Brother    Drug abuse Brother    Heart disease Sister    Hypertension Sister    Thyroid cancer Sister    Diabetes Neg Hx    Colon cancer Neg Hx    Stomach cancer Neg Hx    Esophageal cancer Neg Hx    Rectal cancer Neg Hx    Liver cancer Neg Hx    ROS: Review of Systems  Constitutional: Positive for weight loss.  Cardiovascular: Negative for chest pain.  Endo/Heme/Allergies:       Negative for hypoglycemia.   PHYSICAL EXAM: Blood pressure 110/71, pulse 94, temperature 97.9 F (36.6 C), temperature source Oral, height 5\' 7"  (1.702 m), weight (!) 413 lb (187.3 kg), SpO2 97 %. Body mass index is 64.68 kg/m. Physical Exam Vitals signs reviewed.  Constitutional:      Appearance: Normal appearance. She is obese.   Cardiovascular:     Rate and Rhythm: Normal rate.     Pulses: Normal pulses.  Pulmonary:     Effort: Pulmonary effort is normal.     Breath sounds: Normal breath sounds.  Musculoskeletal: Normal range of motion.  Skin:    General: Skin is warm and dry.  Neurological:     Mental Status: She is alert and oriented to person, place, and time.  Psychiatric:        Behavior: Behavior normal.   RECENT LABS AND TESTS: BMET    Component Value Date/Time   NA 140 09/27/2018 1602   K 4.7 09/27/2018 1602   CL 100 09/27/2018 1602   CO2 24 09/27/2018 1602   GLUCOSE 73 09/27/2018 1602   GLUCOSE 83 06/04/2016 1155   BUN 16 09/27/2018 1602   CREATININE 1.18 (H) 09/27/2018 1602   CREATININE 0.90 06/04/2016 1155   CALCIUM 9.2 09/27/2018 1602   GFRNONAA 59 (L) 09/27/2018 1602   GFRNONAA 84 06/04/2016 1155   GFRAA 69 09/27/2018 1602   GFRAA >89 06/04/2016 1155   Lab Results  Component Value Date   HGBA1C 5.5 09/28/2018   HGBA1C 5.4 05/24/2018   HGBA1C 5.3 12/10/2017   HGBA1C 5.4 06/25/2017   HGBA1C 5.2 05/19/2017   Lab Results  Component Value Date   INSULIN 29.7 (H) 09/28/2018   INSULIN 28.3 (H) 05/24/2018   INSULIN 43.6 (H) 12/10/2017   INSULIN 30.4 (H) 06/25/2017   INSULIN 29.7 (H) 01/20/2017   CBC    Component Value Date/Time   WBC 10.6 12/10/2017 1242   WBC 13.3 (H) 07/03/2017 0610   RBC 4.64 12/10/2017 1242   RBC 4.33 07/03/2017 0610  HGB 12.5 12/10/2017 1242   HCT 38.4 12/10/2017 1242   PLT 293 07/03/2017 0610   PLT 343 05/18/2017 1611   MCV 83 12/10/2017 1242   MCH 26.9 12/10/2017 1242   MCH 25.9 (L) 07/03/2017 0610   MCHC 32.6 12/10/2017 1242   MCHC 32.4 07/03/2017 0610   RDW 16.2 (H) 12/10/2017 1242   LYMPHSABS 5.2 (H) 12/10/2017 1242   MONOABS 0.6 07/03/2017 0610   EOSABS 0.0 12/10/2017 1242   BASOSABS 0.0 12/10/2017 1242   Iron/TIBC/Ferritin/ %Sat    Component Value Date/Time   IRON 25 (L) 05/18/2017 1611   TIBC 363 05/18/2017 1611   FERRITIN 16  05/18/2017 1611   IRONPCTSAT 7 (LL) 05/18/2017 1611   Lipid Panel     Component Value Date/Time   CHOL 146 05/24/2018 1044   TRIG 53 05/24/2018 1044   HDL 41 05/24/2018 1044   CHOLHDL 3.6 11/21/2015 1047   VLDL 10 11/21/2015 1047   LDLCALC 94 05/24/2018 1044   Hepatic Function Panel     Component Value Date/Time   PROT 7.3 05/24/2018 1044   ALBUMIN 4.1 05/24/2018 1044   AST 29 05/24/2018 1044   ALT 24 05/24/2018 1044   ALKPHOS 70 05/24/2018 1044   BILITOT 0.3 05/24/2018 1044      Component Value Date/Time   TSH 1.43 05/19/2017 1245   TSH 1.460 10/27/2016 1058   TSH 0.96 11/21/2015 1047   Results for Catherine [name], Catherine [name] (MRN [id number]) as of 11/23/2018 11:28  Ref. Range 09/28/2018 09:09  Vitamin D, 25-Hydroxy Latest Ref Range: 30.0 - 100.0 ng/mL 25.6 (L)   OBESITY BEHAVIORAL INTERVENTION VISIT  Today's visit was #46  Starting weight: 468 lbs Starting date: 10/27/2016 Today's weight: 413 lbs Today's date: 11/23/2018 Total lbs lost to date: 4855  ASK: We discussed the diagnosis of obesity with Catherine [name] today and Catherine [name] agreed to give us permission to discuss obesity behavioral modification therapy today.  ASSESS: Catherine [name] has the diagnosis of obesity and her BMI today is @ 64.68. Catherine [name] is in the action stage of change.   ADVISE: Catherine [name] was educated on the multiple health risks of obesity as well as the benefit of weight loss to improve her health. She was advised of the need for long term treatment and the importance of lifestyle modifications to improve her current health and to decrease her risk of future health problems.  AGREE: Multiple dietary modification options and treatment options were discussed and  Catherine [name] agreed to follow the recommendations documented in the above note.  ARRANGE: Catherine [name] was educated on the importance of frequent visits to treat obesity as outlined per CMS and USPSTF guidelines and agreed to schedule her next follow up  appointment today.  IMarianna Payment, Denise Haag, am acting as Energy managertranscriptionist for [employer] Aguilar, PA-C

## 2018-12-07 ENCOUNTER — Encounter (INDEPENDENT_AMBULATORY_CARE_PROVIDER_SITE_OTHER): Payer: Self-pay | Admitting: Physician Assistant

## 2018-12-07 ENCOUNTER — Ambulatory Visit (INDEPENDENT_AMBULATORY_CARE_PROVIDER_SITE_OTHER): Payer: 59 | Admitting: Physician Assistant

## 2018-12-07 VITALS — BP 91/60 | HR 110 | Temp 97.6°F | Ht 67.0 in | Wt >= 6400 oz

## 2018-12-07 DIAGNOSIS — Z6841 Body Mass Index (BMI) 40.0 and over, adult: Secondary | ICD-10-CM | POA: Diagnosis not present

## 2018-12-07 DIAGNOSIS — K219 Gastro-esophageal reflux disease without esophagitis: Secondary | ICD-10-CM

## 2018-12-07 DIAGNOSIS — E559 Vitamin D deficiency, unspecified: Secondary | ICD-10-CM | POA: Diagnosis not present

## 2018-12-07 DIAGNOSIS — Z9189 Other specified personal risk factors, not elsewhere classified: Secondary | ICD-10-CM | POA: Diagnosis not present

## 2018-12-07 MED ORDER — LIRAGLUTIDE -WEIGHT MANAGEMENT 18 MG/3ML ~~LOC~~ SOPN: 3.0000 mg | PEN_INJECTOR | Freq: Every day | SUBCUTANEOUS | 0 refills | Status: DC

## 2018-12-07 MED ORDER — VITAMIN D (ERGOCALCIFEROL) 1.25 MG (50000 UNIT) PO CAPS: 50000.0000 [IU] | ORAL_CAPSULE | ORAL | 0 refills | Status: DC

## 2018-12-07 MED FILL — VIT D2 1.25 MG (50,000 UNIT: 1.25 MG | 28 days supply | Qty: 4 | Fill #0

## 2018-12-07 MED FILL — SAXENDA 18 MG/3 ML PEN: 18 | 30 days supply | Qty: 15 | Fill #0

## 2018-12-07 NOTE — Progress Notes (Signed)
Office: [phone]  /  Fax: [phone]   HPI:   Chief Complaint: OBESITY Catherine [name] is here to discuss her progress with her obesity treatment plan. She is on the Category 4 plan and is following her eating plan approximately 75 % of the time. She states she is going to the gym 25 minutes 2-3 times per week. Catherine [name] reports that she had some stomach issues over the last few days and was unable to get all of her food in. She is meeting with a personal trainer today. Her weight is (!) 415 lb (188.2 kg) today and has gained 2 lbs since her last visit. She has lost 53 lbs since starting treatment with [name].  Gastroesophageal Reflux Disease Catherine [name] has a diagnosis of Gastroesophageal Reflux Disease. She is on Pepcid. She reports reflux the past few days.  Vitamin D deficiency Catherine [name] has a diagnosis of vitamin D deficiency. She is currently taking prescription Vit D and denies nausea, vomiting or muscle weakness.  At risk for osteopenia and osteoporosis Catherine [name] is at higher risk of osteopenia and osteoporosis due to vitamin D deficiency.   ASSESSMENT AND PLAN:  Vitamin D deficiency - Plan: Vitamin D, Ergocalciferol, (DRISDOL) 1.25 MG (50000 UT) CAPS capsule  Gastroesophageal reflux disease, esophagitis presence not specified  At risk for osteoporosis  Class 3 severe obesity with serious comorbidity and body mass index (BMI) of 60.0 to 69.9 in adult, unspecified obesity type (HCC) - Plan: Liraglutide -Weight Management (SAXENDA) 18 MG/3ML SOPN  PLAN:  Gastroesophageal Reflux Disease Catherine [name] was instructed to continue with Pepcid. Catherine [name] agrees to follow up with our clinic in 2 weeks.  Vitamin D Deficiency Catherine [name] was informed that low vitamin D levels contributes to fatigue and are associated with obesity, breast, and colon cancer. Catherine [name] agrees to continue to take prescription Vit D 50,000 IU every week #4 with no refills and will follow up for routine testing of vitamin D, at  least 2-3 times per year. She was informed of the risk of over-replacement of vitamin D and agrees to not increase her dose unless she discusses this with [name] first. Catherine [name] agrees to follow up with our clinic in 2 weeks.  At risk for osteopenia and osteoporosis Catherine [name] was given extended  (15 minutes) osteoporosis prevention counseling today. Catherine [name] is at risk for osteopenia and osteoporsis due to her vitamin D deficiency. She was encouraged to take her vitamin D and follow her higher calcium diet and increase strengthening exercise to help strengthen her bones and decrease her risk of osteopenia and osteoporosis.  Obesity Catherine [name] is currently in the action stage of change. As such, her goal is to continue with weight loss efforts She has agreed to follow the Category 4 plan Catherine [name] has been instructed to work up to a goal of 150 minutes of combined cardio and strengthening exercise per week for weight loss and overall health benefits. We discussed the following Behavioral Modification Strategies today: No skipping meals and work on meal planning and easy cooking plans We will decrease dose of Saxenda 2.4 mg subq daily #5 pens with no refills. Catherine [name] agrees with this plan and will follow up with our clinic in 2 weeks.  She was informed of the importance of frequent follow up visits to maximize her success with intensive lifestyle modifications for her multiple health conditions.  ALLERGIES: Allergies  Allergen Reactions  . Citrus Swelling    MEDICATIONS: Current Outpatient Medications on File Prior to Visit  Medication Sig Dispense Refill  .  aspirin-acetaminophen-caffeine (EXCEDRIN MIGRAINE) 250-250-65 MG per tablet Take 2 tablets by mouth 2 (two) times daily as needed for migraine. For headache     . budesonide (PULMICORT) 180 MCG/ACT inhaler Inhale 1 puff into the lungs 2 (two) times daily. 1 Inhaler 1  . buPROPion (WELLBUTRIN SR) 200 MG 12 hr tablet Take 1 tablet (200 mg total) by  mouth daily at 12 noon. 30 tablet 0  . cetirizine (ZYRTEC) 10 MG tablet Take 10 mg by mouth daily.    . diclofenac (CATAFLAM) 50 MG tablet Take 50 mg by mouth 2 (two) times daily at 10 AM and 5 PM.    . diclofenac sodium (VOLTAREN) 1 % GEL Apply 2 g topically 4 (four) times daily. 100 g 1  . famotidine (PEPCID) 10 MG tablet Take 1 tablet (10 mg total) by mouth daily. 30 tablet 6  . furosemide (LASIX) 20 MG tablet Take 1 tablet (20 mg total) by mouth 2 (two) times daily. 60 tablet 3  . gabapentin (NEURONTIN) 300 MG capsule TAKE 2 CAPSULES (600 MG TOTAL) BY MOUTH 3 TIMES A DAY 180 capsule 3  . ibuprofen (ADVIL,MOTRIN) 200 MG tablet Take 600-800 mg by mouth every 8 (eight) hours as needed (for pain.).     . Insulin Pen Needle (BD PEN NEEDLE NANO U/F) 32G X 4 MM MISC 1 Package by Does not apply route daily at 12 noon. 100 each 0  . meloxicam (MOBIC) 15 MG tablet Take 1 tablet (15 mg total) by mouth daily. 30 tablet 0  . polyethylene glycol powder (GLYCOLAX/MIRALAX) powder Take 17 g by mouth daily as needed. 3350 g 1   No current facility-administered medications on file prior to visit.     PAST MEDICAL HISTORY: Past Medical History:  Diagnosis Date  . Allergy   . Anxiety   . Arthritis   . Asthma    since baby, seasonal  . ASTHMA, UNSPECIFIED 12/10/2006   Qualifier: Diagnosis of  By: Abundio Miu    . CELLULITIS AND ABSCESS OF LEG EXCEPT FOOT 12/30/2007   Qualifier: Diagnosis of  By: Daphine Deutscher FNP, Zena Amos    . Cyst of bone    right side of spine  . DEPENDENT EDEMA, LEGS, BILATERAL 11/22/2008   Qualifier: Diagnosis of  By: Barbaraann Barthel MD, [place]    . Depression 2003Dx  . Edema   . Food allergy    citris  . FOOT PAIN, RIGHT 11/22/2008   Qualifier: Diagnosis of  By: Barbaraann Barthel MD, [place]    . Gastric ulcer   . GERD (gastroesophageal reflux disease)   . Gout   . Hypertension    took Norvasc for 3 months, doctor discontinued no longer on BP medications  . Joint pain   . KNEE INJURY,  LEFT 01/01/2009   Qualifier: Diagnosis of  By: Delrae Alfred MD, Lanora Manis    . Migraine   . ONYCHOMYCOSIS, TOENAILS 11/22/2008   Qualifier: Diagnosis of  By: Barbaraann Barthel MD, [place]    . Shortness of breath   . Sleep apnea   . TINEA PEDIS 11/22/2008   Qualifier: Diagnosis of  By: Barbaraann Barthel MD, Benetta Spar      PAST SURGICAL HISTORY: Past Surgical History:  Procedure Laterality Date  . DILITATION & CURRETTAGE/HYSTROSCOPY WITH NOVASURE ABLATION N/A 07/03/2017   Procedure: DILATATION & CURETTAGE/HYSTEROSCOPY WITH NOVASURE ABLATION;  Surgeon: Genia Del, MD;  Location: [facility];  Service: Gynecology;  Laterality: N/A;  . UPPER GI ENDOSCOPY    . WISDOM TOOTH EXTRACTION  2008   x4  SOCIAL HISTORY: Social History   Tobacco Use  . Smoking status: Former Smoker    Packs/day: 0.50    Years: 18.00    Pack years: 9.00    Types: Cigarettes    Start date: 10/13/1996    Last attempt to quit: 01/12/2015    Years since quitting: 3.9  . Smokeless tobacco: Never Used  Substance Use Topics  . Alcohol use: Yes    Alcohol/week: 1.0 standard drinks    Types: 1 Standard drinks or equivalent per week    Comment: rarely drinks  . Drug use: No    Comment: marjuana - former use, quit 4 years ago    FAMILY HISTORY: Family History  Problem Relation Age of Onset  . Hypertension Mother   . Hyperlipidemia Mother   . Cancer Mother        bone cancer  . Heart disease Mother   . Sudden death Mother   . Stroke Mother   . Thyroid disease Mother   . Sleep apnea Mother   . Obesity Mother   . Sudden death Father   . Hypertension Father   . Heart disease Father   . Hyperlipidemia Father   . Prostate cancer Father   . Lung cancer Father   . Obesity Father   . Alcoholism Father   . Sudden death Brother   . Hypertension Brother   . Hyperlipidemia Brother   . Heart attack Brother   . Heart disease Brother   . Stroke Brother   . Alcoholism Brother   . Drug abuse Brother   . Heart disease Sister   .  Hypertension Sister   . Thyroid cancer Sister   . Diabetes Neg Hx   . Colon cancer Neg Hx   . Stomach cancer Neg Hx   . Esophageal cancer Neg Hx   . Rectal cancer Neg Hx   . Liver cancer Neg Hx     ROS: Review of Systems  Constitutional: Negative for weight loss.  Gastrointestinal: Negative for nausea and vomiting.       Positive for reflux  Musculoskeletal:       Negative for muscle weakness    PHYSICAL EXAM: Blood pressure 91/60, pulse (!) 110, temperature 97.6 F (36.4 C), temperature source Oral, height  (1.702 m), weight (!) 415 lb (188.2 kg), SpO2 98 %. Body mass index is 65 kg/m. Physical Exam Vitals signs reviewed.  Constitutional:      Appearance: Normal appearance. She is obese.  Cardiovascular:     Rate and Rhythm: Normal rate.     Pulses: Normal pulses.  Pulmonary:     Effort: Pulmonary effort is normal.  Musculoskeletal: Normal range of motion.  Skin:    General: Skin is warm and dry.  Neurological:     Mental Status: She is alert and oriented to person, place, and time.  Psychiatric:        Mood and Affect: Mood normal.        Behavior: Behavior normal.     RECENT LABS AND TESTS: BMET    Component Value Date/Time   NA 140 09/27/2018 1602   K 4.7 09/27/2018 1602   CL 100 09/27/2018 1602   CO2 24 09/27/2018 1602   GLUCOSE 73 09/27/2018 1602   GLUCOSE 83 06/04/2016 1155   BUN 16 09/27/2018 1602   CREATININE 1.18 (H) 09/27/2018 1602   CREATININE 0.90 06/04/2016 1155   CALCIUM 9.2 09/27/2018 1602   GFRNONAA 59 (L) 09/27/2018 1602  GFRNONAA 84 06/04/2016 1155   GFRAA 69 09/27/2018 1602   GFRAA >89 06/04/2016 1155   Lab Results  Component Value Date   HGBA1C 5.5 09/28/2018   HGBA1C 5.4 05/24/2018   HGBA1C 5.3 12/10/2017   HGBA1C 5.4 06/25/2017   HGBA1C 5.2 05/19/2017   Lab Results  Component Value Date   INSULIN 29.7 (H) 09/28/2018   INSULIN 28.3 (H) 05/24/2018   INSULIN 43.6 (H) 12/10/2017   INSULIN 30.4 (H) 06/25/2017    INSULIN 29.7 (H) 01/20/2017   CBC    Component Value Date/Time   WBC 10.6 12/10/2017 1242   WBC 13.3 (H) 07/03/2017 0610   RBC 4.64 12/10/2017 1242   RBC 4.33 07/03/2017 0610   HGB 12.5 12/10/2017 1242   HCT 38.4 12/10/2017 1242   PLT 293 07/03/2017 0610   PLT 343 05/18/2017 1611   MCV 83 12/10/2017 1242   MCH 26.9 12/10/2017 1242   MCH 25.9 (L) 07/03/2017 0610   MCHC 32.6 12/10/2017 1242   MCHC 32.4 07/03/2017 0610   RDW 16.2 (H) 12/10/2017 1242   LYMPHSABS 5.2 (H) 12/10/2017 1242   MONOABS 0.6 07/03/2017 0610   EOSABS 0.0 12/10/2017 1242   BASOSABS 0.0 12/10/2017 1242   Iron/TIBC/Ferritin/ %Sat    Component Value Date/Time   IRON 25 (L) 05/18/2017 1611   TIBC 363 05/18/2017 1611   FERRITIN 16 05/18/2017 1611   IRONPCTSAT 7 (LL) 05/18/2017 1611   Lipid Panel     Component Value Date/Time   CHOL 146 05/24/2018 1044   TRIG 53 05/24/2018 1044   HDL 41 05/24/2018 1044   CHOLHDL 3.6 11/21/2015 1047   VLDL 10 11/21/2015 1047   LDLCALC 94 05/24/2018 1044   Hepatic Function Panel     Component Value Date/Time   PROT 7.3 05/24/2018 1044   ALBUMIN 4.1 05/24/2018 1044   AST 29 05/24/2018 1044   ALT 24 05/24/2018 1044   ALKPHOS 70 05/24/2018 1044   BILITOT 0.3 05/24/2018 1044      Component Value Date/Time   TSH 1.43 05/19/2017 1245   TSH 1.460 10/27/2016 1058   TSH 0.96 11/21/2015 1047     Ref. Range 09/28/2018 09:09  Vitamin D, 25-Hydroxy Latest Ref Range: 30.0 - 100.0 ng/mL 25.6 (L)     OBESITY BEHAVIORAL INTERVENTION VISIT  Today's visit was # 47  Starting weight: 468 lbs Starting date: 10/27/2016 Today's weight : 415 lbs Today's date: 12/07/2018 Total lbs lost to date: 53     12/07/2018  BP 91/60  Temp 97.6 F (36.4 C)  Pulse 110 (A)  SpO2 98 %  Weight 415 lb (A)  Height  (1.702 m)  BMI (Calculated) 64.98    ASK: We discussed the diagnosis of obesity with [name] today and Noorah agreed to give [name] permission to discuss  obesity behavioral modification therapy today.  ASSESS: Maisee has the diagnosis of obesity and her BMI today is 64.98 Verta is in the action stage of change   ADVISE: Daryl was educated on the multiple health risks of obesity as well as the benefit of weight loss to improve her health. She was advised of the need for long term treatment and the importance of lifestyle modifications to improve her current health and to decrease her risk of future health problems.  AGREE: Multiple dietary modification options and treatment options were discussed and  Nico agreed to follow the recommendations documented in the above note.  ARRANGE: Kaziyah was educated on the importance of  frequent visits to treat obesity as outlined per CMS and USPSTF guidelines and agreed to schedule her next follow up appointment today.  I, Tammy Wysor, am acting as Energy manager for [employer]. IAlois Cliche, PA-C have reviewed above note and agree with its content

## 2018-12-08 DIAGNOSIS — G4733 Obstructive sleep apnea (adult) (pediatric): Secondary | ICD-10-CM | POA: Diagnosis not present

## 2018-12-21 ENCOUNTER — Encounter (INDEPENDENT_AMBULATORY_CARE_PROVIDER_SITE_OTHER): Payer: Self-pay

## 2018-12-27 ENCOUNTER — Ambulatory Visit (INDEPENDENT_AMBULATORY_CARE_PROVIDER_SITE_OTHER): Payer: 59 | Admitting: Physician Assistant

## 2018-12-27 ENCOUNTER — Encounter (INDEPENDENT_AMBULATORY_CARE_PROVIDER_SITE_OTHER): Payer: Self-pay

## 2018-12-28 ENCOUNTER — Other Ambulatory Visit: Payer: Self-pay | Admitting: Family Medicine

## 2018-12-28 DIAGNOSIS — R609 Edema, unspecified: Secondary | ICD-10-CM

## 2018-12-28 MED FILL — FUROSEMIDE 20 MG TABS: 20 | 30 days supply | Qty: 60 | Fill #0 | Status: TO

## 2019-01-04 ENCOUNTER — Other Ambulatory Visit: Payer: Self-pay

## 2019-01-04 ENCOUNTER — Telehealth (INDEPENDENT_AMBULATORY_CARE_PROVIDER_SITE_OTHER): Payer: Self-pay | Admitting: Nurse Practitioner

## 2019-01-04 DIAGNOSIS — R142 Eructation: Secondary | ICD-10-CM

## 2019-01-04 DIAGNOSIS — K219 Gastro-esophageal reflux disease without esophagitis: Secondary | ICD-10-CM

## 2019-01-04 DIAGNOSIS — R1084 Generalized abdominal pain: Secondary | ICD-10-CM

## 2019-01-04 DIAGNOSIS — R140 Abdominal distension (gaseous): Secondary | ICD-10-CM

## 2019-01-04 MED ORDER — PANTOPRAZOLE SODIUM 40 MG PO TBEC: 40.0000 mg | DELAYED_RELEASE_TABLET | Freq: Every day | ORAL | 3 refills | Status: DC

## 2019-01-04 NOTE — Patient Instructions (Signed)
Thanks for participating in the telephone visit today.   For GERD management please:   Please go to bed on an empty stomach.  Do not eat or drink anything for 3 hours prior to going to bed  Elevate head of bed 6 to 8 inches if possible.  If not, consider wedge pillow.   We have sent a prescription for Protonix 40 mg once daily to your pharmacy.  Please take this 30 minutes before breakfast.  If needed you can add Pepcid 20 mg at bedtime  Avoid excessive intake of chocolate  Avoid trigger food  If you consume caffeine please trying decreasing the amount you consume by 1/2  Great job on the weight loss!   Please call our office in 2-3 weeks with a condition update. If not improving despite above measures then we likely pursue testing.

## 2019-01-04 NOTE — Progress Notes (Signed)
Telephonic visit in setting of COVID19 epidemic  Patient has given consent for a telephonic visit today and understands that is the same as is required for any face-to-face patient encounter except that the service was provided via telephone.   At the time of this telephonic visit the patient was located at home and I, the Provider, at the office of [facility].   Patient is an establish patient, last office visit was December 2017.   No one other than the patient and I participated in this telephonic visit today.   Total time of telephonic visit :  28 minutes.       Chief Complaint:   GERD, upper abdominal burning, and bowel changes  IMPRESSION and PLAN:     77.  38 year old female with longstanding GERD manifested mainly as regurgitation / coughing, especially at night.  Through the years she has taken Nexium/omeprazole and Zantac.  Eventually all of these have run their course.  Most recently she has been on Pepcid twice daily and still very symptomatic.  -Her symptoms had significantly improved with weight loss, she was even able to stop GERD meds for a while.  She has intentionally lost 65 pounds since we saw her last.  -Antireflux measures discussed.  She will either elevate the head of her bed or purchase a wedge pillow.  See after visit summary    2. Frequent episodes of upper abdominal burning / bloating / belching. Symptoms followed by onset of loose stool the next morning. Stools return to normal in between episodes.  No associated N/V.  -Wonder if Saxenda (weight loss drug) causing some GI distress, hopefully not but it can be associated with bowel changes, dyspepsia, etc..Marland KitchenIf symptoms don't improve with above measures consider trial of flagyl or xifaxan for ? 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 Marina Goodell in December 2017 for evaluation of GERD symptoms.  In 2017 we started the patient on omeprazole twice daily.   Patient took omeprazole twice daily for years, it eventually lost efficacy.  November 2019 PCP started patient on daily Pepcid, this had to be doubled for persistent symptoms in late January 2020.  Despite doubling of Pepcid patient is symptomatic 3 to 4 days a week.  She describes upper abdominal burning/fullness. Sour drinks make symptoms worse.  She often wakes up coughing/choking from regurgitation in the middle of the night.  Over the course of a few hours she becomes "gassy", starts passing "sour" belches.  After enough belching and passing flatus she generally gets relief and can get back to sleep as long as she props herself up.  Interestingly, the next morning she will inevitably have loose stools. The loose stools will continue for couple days then she returns to more solid stool.  No recent antibiotics.  Evon Slack has intentionally lost nearly 65 pounds since we saw her last a couple of years ago.  Has been working with a nutritionist and is also on Saxenda.    Review of systems:     No nausea/vomiting .  No chest pain or shortness of breath . No fevers, no urinary sx   Past Medical History:  Diagnosis Date  . Allergy   . Anxiety   . Arthritis   . Asthma    since baby, seasonal  . ASTHMA, UNSPECIFIED 12/10/2006   Qualifier: Diagnosis of  By: Abundio Miu    . CELLULITIS AND ABSCESS OF LEG EXCEPT FOOT 12/30/2007  Qualifier: Diagnosis of  By: Daphine Deutscher FNP, Zena Amos    . Cyst of bone    right side of spine  . DEPENDENT EDEMA, LEGS, BILATERAL 11/22/2008   Qualifier: Diagnosis of  By: Barbaraann Barthel MD, [place]    . Depression 2003Dx  . Edema   . Food allergy    citris  . FOOT PAIN, RIGHT 11/22/2008   Qualifier: Diagnosis of  By: Barbaraann Barthel MD, [place]    . Gastric ulcer   . GERD (gastroesophageal reflux disease)   . Gout   . Hypertension    took Norvasc for 3 months, doctor discontinued no longer on BP medications  . Joint pain   . KNEE INJURY, LEFT 01/01/2009   Qualifier: Diagnosis of   By: Delrae Alfred MD, Lanora Manis    . Migraine   . ONYCHOMYCOSIS, TOENAILS 11/22/2008   Qualifier: Diagnosis of  By: Barbaraann Barthel MD, [place]    . Shortness of breath   . Sleep apnea   . TINEA PEDIS 11/22/2008   Qualifier: Diagnosis of  By: Barbaraann Barthel MD, [place]      Current Outpatient Medications  Medication Sig Dispense Refill  . cetirizine (ZYRTEC) 10 MG tablet Take 10 mg by mouth daily.    . cyanocobalamin 100 MCG tablet Take 100 mcg by mouth daily.    . famotidine (PEPCID) 10 MG tablet Take 1 tablet (10 mg total) by mouth daily. 30 tablet 6  . furosemide (LASIX) 20 MG tablet TAKE 1 TABLET (20 MG TOTAL) BY MOUTH 2 TIMES DAILY. 60 tablet 3  . gabapentin (NEURONTIN) 300 MG capsule TAKE 2 CAPSULES (600 MG TOTAL) BY MOUTH 3 TIMES A DAY 180 capsule 3  . Insulin Pen Needle (BD PEN NEEDLE NANO U/F) 32G X 4 MM MISC 1 Package by Does not apply route daily at 12 noon. 100 each 0  . Liraglutide -Weight Management (SAXENDA) 18 MG/3ML SOPN Inject 3 mg into the skin daily. 5 pen 0  . polyethylene glycol powder (GLYCOLAX/MIRALAX) powder Take 17 g by mouth daily as needed. 3350 g 1  . Vitamin D, Ergocalciferol, (DRISDOL) 1.25 MG (50000 UT) CAPS capsule Take 1 capsule (50,000 Units total) by mouth every 7 (seven) days. 4 capsule 0  . aspirin-acetaminophen-caffeine (EXCEDRIN MIGRAINE) 250-250-65 MG per tablet Take 2 tablets by mouth 2 (two) times daily as needed for migraine. For headache     . budesonide (PULMICORT) 180 MCG/ACT inhaler Inhale 1 puff into the lungs 2 (two) times daily. (Patient not taking: Reported on 01/04/2019) 1 Inhaler 1   No current facility-administered medications for this visit.      Willette Cluster , NP 01/04/2019, 3:10 PM

## 2019-01-06 ENCOUNTER — Encounter (INDEPENDENT_AMBULATORY_CARE_PROVIDER_SITE_OTHER): Payer: Self-pay

## 2019-01-07 DIAGNOSIS — G4733 Obstructive sleep apnea (adult) (pediatric): Secondary | ICD-10-CM | POA: Diagnosis not present

## 2019-01-07 MED FILL — PANTOPRAZOLE SOD DR 40 MG T: 40 | 90 days supply | Qty: 90 | Fill #0

## 2019-01-09 ENCOUNTER — Telehealth: Payer: Self-pay | Admitting: Nurse Practitioner

## 2019-01-09 DIAGNOSIS — J0100 Acute maxillary sinusitis, unspecified: Secondary | ICD-10-CM

## 2019-01-09 MED ORDER — PHENYLEPH-DOXYLAMINE-DM-APAP 5-6.25-10-325 MG PO CAPS: 1.0000 | ORAL_CAPSULE | Freq: Two times a day (BID) | ORAL | 0 refills | Status: DC

## 2019-01-09 MED ORDER — FLUTICASONE PROPIONATE 50 MCG/ACT NA SUSP: 2.0000 | Freq: Every day | NASAL | 6 refills | Status: DC

## 2019-01-09 NOTE — Progress Notes (Signed)
rx sent to pharmacy Orders Placed This Encounter         Phenyleph-Doxylamine-DM-APAP (NYQUIL SEVERE COLD/FLU) 5-6.25-10-325 MG CAPS         Sig: Take 1 tablet by mouth 2 (two) times daily.         Dispense:  112 capsule         Refill:  0         Order Specific Question: Supervising Provider         Answer: Eber Hong [[id number]]

## 2019-01-09 NOTE — Progress Notes (Signed)

## 2019-01-09 NOTE — Addendum Note (Signed)
Addended by: Bennie Pierini on: 01/09/2019 05:12 PM   Modules accepted: Orders

## 2019-01-10 MED FILL — FLUTICASONE PROP 50 MCG SPR: 50 | 30 days supply | Qty: 16 | Fill #0

## 2019-01-27 ENCOUNTER — Encounter (INDEPENDENT_AMBULATORY_CARE_PROVIDER_SITE_OTHER): Payer: Self-pay | Admitting: Family Medicine

## 2019-01-27 ENCOUNTER — Ambulatory Visit (INDEPENDENT_AMBULATORY_CARE_PROVIDER_SITE_OTHER): Payer: 59 | Admitting: Family Medicine

## 2019-01-27 ENCOUNTER — Other Ambulatory Visit: Payer: Self-pay

## 2019-01-27 DIAGNOSIS — E559 Vitamin D deficiency, unspecified: Secondary | ICD-10-CM

## 2019-01-27 DIAGNOSIS — Z6841 Body Mass Index (BMI) 40.0 and over, adult: Secondary | ICD-10-CM | POA: Diagnosis not present

## 2019-01-27 DIAGNOSIS — E8881 Metabolic syndrome: Secondary | ICD-10-CM | POA: Diagnosis not present

## 2019-01-27 MED ORDER — VITAMIN D (ERGOCALCIFEROL) 1.25 MG (50000 UNIT) PO CAPS: 50000.0000 [IU] | ORAL_CAPSULE | ORAL | 0 refills | Status: DC

## 2019-01-27 MED ORDER — LIRAGLUTIDE -WEIGHT MANAGEMENT 18 MG/3ML ~~LOC~~ SOPN: 3.0000 mg | PEN_INJECTOR | Freq: Every day | SUBCUTANEOUS | 0 refills | Status: DC

## 2019-01-27 MED FILL — SAXENDA 18 MG/3 ML PEN: 18 | 30 days supply | Qty: 15 | Fill #0

## 2019-01-27 MED FILL — VIT D2 1.25 MG (50,000 UNIT: 1.25 MG | 28 days supply | Qty: 4 | Fill #0

## 2019-01-31 ENCOUNTER — Other Ambulatory Visit: Payer: Self-pay

## 2019-01-31 ENCOUNTER — Telehealth (INDEPENDENT_AMBULATORY_CARE_PROVIDER_SITE_OTHER): Payer: 59 | Admitting: Family Medicine

## 2019-01-31 DIAGNOSIS — J0100 Acute maxillary sinusitis, unspecified: Secondary | ICD-10-CM | POA: Diagnosis not present

## 2019-01-31 MED ORDER — AMOXICILLIN-POT CLAVULANATE 875-125 MG PO TABS: 1.0000 | ORAL_TABLET | Freq: Two times a day (BID) | ORAL | 0 refills | Status: AC

## 2019-01-31 MED FILL — AMOX-CLAV 875-125 MG TABLET: 875-125 | 5 days supply | Qty: 10 | Fill #0

## 2019-01-31 NOTE — Progress Notes (Signed)
[facility] Telemedicine Visit  Patient consented to have virtual visit. Method of visit: Video  Encounter participants: Patient: Catherine [name] - located at home Provider: Leland Her - located at [facility] Others (if applicable): none  Chief Complaint: sinus issues  HPI:  Patient states that she has been having sinus issues for the last few weeks.  She had an Evisit a couple weeks ago and was told that it was viral and given nasal spray and has been taking over-the-counter medications.  Over the weekend she felt a little bit better and has now started to feel worse again.  She has not had any fever.  She continues to have congestion and sinus drainage that is not purulent.  No cough or shortness of breath. She is tolerating p.o. well  ROS: per HPI  Pertinent PMHx: Not applicable  Exam:  General: Well-appearing, no acute distress HEENT: Oropharynx nonerythematous and nonedematous.  Unable to visualize nasal mucosa on video well. Respiratory: Normal work of breathing on room air  Assessment/Plan: Acute sinusitis Patient likely had a viral sinusitis and now has had double worsening.  Treat with Augmentin.  Discussed supportive care at home with continued use of nasal spray and good p.o. hydration.   Leland Her, DO PGY-3, [facility] 01/31/2019 9:56 AM

## 2019-02-01 NOTE — Progress Notes (Signed)
Office: [phone]  /  Fax: [phone] TeleHealth Visit:  Catherine [name] has verbally consented to this TeleHealth visit today. The patient is located at home, the provider is located at the [facility] office. The participants in this visit include the listed provider and patient. The visit was conducted today via webex.  HPI:   Chief Complaint: OBESITY Catherine [name] is here to discuss her progress with her obesity treatment plan. She is on the Category 4 plan and is following her eating plan approximately 75 % of the time. She states she is exercising 0 minutes 0 times per week. Catherine [name] voices her biggest obstacle in the past month is finding food and limitations on how much you can buy. She has been able to find fruits and vegetables relatively easily. She has downloaded a few exercise apps, she just hasn't consistently done any.  We were unable to weigh the patient today for this TeleHealth visit. She feels as if she has maintained her weight since her last visit. She has lost 53 lbs since starting treatment with [name].  Vitamin D Deficiency Catherine [name] has a diagnosis of vitamin D deficiency. She is currently taking prescription Vit D. She notes fatigue and denies nausea, vomiting or muscle weakness.  Insulin Resistance Catherine [name] has a diagnosis of insulin resistance based on her elevated fasting insulin level >5. Although Catherine [name]'s blood glucose readings are still under good control, insulin resistance puts her at greater risk of metabolic syndrome and diabetes. She is on Saxenda and denies having much carbohydrate cravings. She continues to work on diet and exercise to decrease risk of diabetes.  ASSESSMENT AND PLAN:  Vitamin D deficiency - Plan: Vitamin D, Ergocalciferol, (DRISDOL) 1.25 MG (50000 UT) CAPS capsule  Insulin resistance  Class 3 severe obesity with serious comorbidity and body mass index (BMI) of 60.0 to 69.9 in adult, unspecified obesity type (HCC) - Plan:  Liraglutide -Weight Management (SAXENDA) 18 MG/3ML SOPN  PLAN:  Vitamin D Deficiency Catherine [name] was informed that low vitamin D levels contributes to fatigue and are associated with obesity, breast, and colon cancer. Catherine [name] agrees to continue taking prescription Vit D ,000 IU every week #4 and we will refill for 1 month. She will follow up for routine testing of vitamin D, at least 2-3 times per year. She was informed of the risk of over-replacement of vitamin D and agrees to not increase her dose unless she discusses this with [name] first. Catherine [name] agrees to follow up with our clinic in 2 weeks.  Insulin Resistance Catherine [name] will continue to work on weight loss, exercise, and decreasing simple carbohydrates in her diet to help decrease the risk of diabetes. We dicussed metformin including benefits and risks. She was informed that eating too many simple carbohydrates or too many calories at one sitting increases the likelihood of GI side effects. We will repeat labs at next appointment. Catherine [name] agrees to follow up with our clinic in 2 weeks as directed to monitor her progress.  Obesity Catherine [name] is currently in the action stage of change. As such, her goal is to continue with weight loss efforts She has agreed to follow the Category 4 plan Catherine [name] has been instructed to work up to a goal of 150 minutes of combined cardio and strengthening exercise per week for weight loss and overall health benefits. We discussed the following Behavioral Modification Strategies today: increasing lean protein intake, increasing vegetables and work on meal planning and easy cooking plans, better snacking choices, and planning for  success We discussed various medication options to help Catherine [name] with her weight loss efforts and we both agreed to continue Saxenda 3 mg SubQ daily #5 pens and we will refill for 1 month.  Catherine [name] has agreed to follow up with our clinic in 2 weeks. She was informed of the importance of frequent  follow up visits to maximize her success with intensive lifestyle modifications for her multiple health conditions.  ALLERGIES: Allergies  Allergen Reactions  . Citrus Swelling    MEDICATIONS: Current Outpatient Medications on File Prior to Visit  Medication Sig Dispense Refill  . aspirin-acetaminophen-caffeine (EXCEDRIN MIGRAINE) 250-250-65 MG per tablet Take 2 tablets by mouth 2 (two) times daily as needed for migraine. For headache     . budesonide (PULMICORT) 180 MCG/ACT inhaler Inhale 1 puff into the lungs 2 (two) times daily. (Patient not taking: Reported on 01/04/2019) 1 Inhaler 1  . buPROPion (WELLBUTRIN SR) 200 MG 12 hr tablet Take 1 tablet (200 mg total) by mouth daily at 12 noon. (Patient not taking: Reported on 01/04/2019) 30 tablet 0  . cetirizine (ZYRTEC) 10 MG tablet Take 10 mg by mouth daily.    . cyanocobalamin 100 MCG tablet Take 100 mcg by mouth daily.    . diclofenac (CATAFLAM) 50 MG tablet Take 50 mg by mouth 2 (two) times daily at 10 AM and 5 PM.    . diclofenac sodium (VOLTAREN) 1 % GEL Apply 2 g topically 4 (four) times daily. (Patient not taking: Reported on 01/04/2019) 100 g 1  . famotidine (PEPCID) 10 MG tablet Take 1 tablet (10 mg total) by mouth daily. 30 tablet 6  . fluticasone (FLONASE) 50 MCG/ACT nasal spray Place 2 sprays into both nostrils daily. 16 g 6  . furosemide (LASIX) 20 MG tablet TAKE 1 TABLET (20 MG TOTAL) BY MOUTH 2 TIMES DAILY. 60 tablet 3  . gabapentin (NEURONTIN) 300 MG capsule TAKE 2 CAPSULES (600 MG TOTAL) BY MOUTH 3 TIMES A DAY 180 capsule 3  . ibuprofen (ADVIL,MOTRIN) 200 MG tablet Take 600-800 mg by mouth every 8 (eight) hours as needed (for pain.).     . Insulin Pen Needle (BD PEN NEEDLE NANO U/F) 32G X 4 MM MISC 1 Package by Does not apply route daily at 12 noon. 100 each 0  . meloxicam (MOBIC) 15 MG tablet Take 1 tablet (15 mg total) by mouth daily. (Patient not taking: Reported on 01/04/2019) 30 tablet 0  . pantoprazole (PROTONIX) 40 MG  tablet Take 1 tablet (40 mg total) by mouth daily. Take 30 minutes prior to breakfast 30 tablet 3  . Phenyleph-Doxylamine-DM-APAP (NYQUIL SEVERE COLD/FLU) 5-6.25-10-325 MG CAPS Take 1 tablet by mouth 2 (two) times daily. 112 capsule 0  . polyethylene glycol powder (GLYCOLAX/MIRALAX) powder Take 17 g by mouth daily as needed. 3350 g 1   No current facility-administered medications on file prior to visit.     PAST MEDICAL HISTORY: Past Medical History:  Diagnosis Date  . Allergy   . Anxiety   . Arthritis   . Asthma    since baby, seasonal  . ASTHMA, UNSPECIFIED 12/10/2006   Qualifier: Diagnosis of  By: Abundio Miu    . CELLULITIS AND ABSCESS OF LEG EXCEPT FOOT 12/30/2007   Qualifier: Diagnosis of  By: Daphine Deutscher FNP, Zena Amos    . Cyst of bone    right side of spine  . DEPENDENT EDEMA, LEGS, BILATERAL 11/22/2008   Qualifier: Diagnosis of  By: Barbaraann Barthel MD, [place]    . Depression  2003Dx  . Edema   . Food allergy    citris  . FOOT PAIN, RIGHT 11/22/2008   Qualifier: Diagnosis of  By: Barbaraann Barthel MD, [place]    . Gastric ulcer   . GERD (gastroesophageal reflux disease)   . Gout   . Hypertension    took Norvasc for 3 months, doctor discontinued no longer on BP medications  . Joint pain   . KNEE INJURY, LEFT 01/01/2009   Qualifier: Diagnosis of  By: Delrae Alfred MD, Lanora Manis    . Migraine   . ONYCHOMYCOSIS, TOENAILS 11/22/2008   Qualifier: Diagnosis of  By: Barbaraann Barthel MD, [place]    . Shortness of breath   . Sleep apnea   . TINEA PEDIS 11/22/2008   Qualifier: Diagnosis of  By: Barbaraann Barthel MD, Benetta Spar      PAST SURGICAL HISTORY: Past Surgical History:  Procedure Laterality Date  . DILITATION & CURRETTAGE/HYSTROSCOPY WITH NOVASURE ABLATION N/A 07/03/2017   Procedure: DILATATION & CURETTAGE/HYSTEROSCOPY WITH NOVASURE ABLATION;  Surgeon: Genia Del, MD;  Location: [facility];  Service: Gynecology;  Laterality: N/A;  . UPPER GI ENDOSCOPY    . WISDOM TOOTH EXTRACTION  2008   x4     SOCIAL HISTORY: Social History   Tobacco Use  . Smoking status: Former Smoker    Packs/day: 0.50    Years: 18.00    Pack years: 9.00    Types: Cigarettes    Start date: 10/13/1996    Last attempt to quit: 01/12/2015    Years since quitting: 4.0  . Smokeless tobacco: Never Used  Substance Use Topics  . Alcohol use: Yes    Alcohol/week: 1.0 standard drinks    Types: 1 Standard drinks or equivalent per week    Comment: rarely drinks  . Drug use: No    Comment: marjuana - former use, quit 4 years ago    FAMILY HISTORY: Family History  Problem Relation Age of Onset  . Hypertension Mother   . Hyperlipidemia Mother   . Cancer Mother        bone cancer  . Heart disease Mother   . Sudden death Mother   . Stroke Mother   . Thyroid disease Mother   . Sleep apnea Mother   . Obesity Mother   . Sudden death Father   . Hypertension Father   . Heart disease Father   . Hyperlipidemia Father   . Prostate cancer Father   . Lung cancer Father   . Obesity Father   . Alcoholism Father   . Sudden death Brother   . Hypertension Brother   . Hyperlipidemia Brother   . Heart attack Brother   . Heart disease Brother   . Stroke Brother   . Alcoholism Brother   . Drug abuse Brother   . Heart disease Sister   . Hypertension Sister   . Thyroid cancer Sister   . Diabetes Neg Hx   . Colon cancer Neg Hx   . Stomach cancer Neg Hx   . Esophageal cancer Neg Hx   . Rectal cancer Neg Hx   . Liver cancer Neg Hx     ROS: Review of Systems  Constitutional: Positive for malaise/fatigue. Negative for weight loss.  Gastrointestinal: Negative for nausea and vomiting.  Musculoskeletal:       Negative muscle weakness    PHYSICAL EXAM: Pt in no acute distress  RECENT LABS AND TESTS: BMET    Component Value Date/Time   NA 140 09/27/2018 1602   K 4.7 09/27/2018  1602   CL 100 09/27/2018 1602   CO2 24 09/27/2018 1602   GLUCOSE 73 09/27/2018 1602   GLUCOSE 83 06/04/2016 1155   BUN 16  09/27/2018 1602   CREATININE 1.18 (H) 09/27/2018 1602   CREATININE 0.90 06/04/2016 1155   CALCIUM 9.2 09/27/2018 1602   GFRNONAA 59 (L) 09/27/2018 1602   GFRNONAA 84 06/04/2016 1155   GFRAA 69 09/27/2018 1602   GFRAA >89 06/04/2016 1155   Lab Results  Component Value Date   HGBA1C 5.5 09/28/2018   HGBA1C 5.4 05/24/2018   HGBA1C 5.3 12/10/2017   HGBA1C 5.4 06/25/2017   HGBA1C 5.2 05/19/2017   Lab Results  Component Value Date   INSULIN 29.7 (H) 09/28/2018   INSULIN 28.3 (H) 05/24/2018   INSULIN 43.6 (H) 12/10/2017   INSULIN 30.4 (H) 06/25/2017   INSULIN 29.7 (H) 01/20/2017   CBC    Component Value Date/Time   WBC 10.6 12/10/2017 1242   WBC 13.3 (H) 07/03/2017 0610   RBC 4.64 12/10/2017 1242   RBC 4.33 07/03/2017 0610   HGB 12.5 12/10/2017 1242   HCT 38.4 12/10/2017 1242   PLT 293 07/03/2017 0610   PLT 343 05/18/2017 1611   MCV 83 12/10/2017 1242   MCH 26.9 12/10/2017 1242   MCH 25.9 (L) 07/03/2017 0610   MCHC 32.6 12/10/2017 1242   MCHC 32.4 07/03/2017 0610   RDW 16.2 (H) 12/10/2017 1242   LYMPHSABS 5.2 (H) 12/10/2017 1242   MONOABS 0.6 07/03/2017 0610   EOSABS 0.0 12/10/2017 1242   BASOSABS 0.0 12/10/2017 1242   Iron/TIBC/Ferritin/ %Sat    Component Value Date/Time   IRON 25 (L) 05/18/2017 1611   TIBC 363 05/18/2017 1611   FERRITIN 16 05/18/2017 1611   IRONPCTSAT 7 (LL) 05/18/2017 1611   Lipid Panel     Component Value Date/Time   CHOL 146 05/24/2018 1044   TRIG 53 05/24/2018 1044   HDL 41 05/24/2018 1044   CHOLHDL 3.6 11/21/2015 1047   VLDL 10 11/21/2015 1047   LDLCALC 94 05/24/2018 1044   Hepatic Function Panel     Component Value Date/Time   PROT 7.3 05/24/2018 1044   ALBUMIN 4.1 05/24/2018 1044   AST 29 05/24/2018 1044   ALT 24 05/24/2018 1044   ALKPHOS 70 05/24/2018 1044   BILITOT 0.3 05/24/2018 1044      Component Value Date/Time   TSH 1.43 05/19/2017 1245   TSH 1.460 10/27/2016 1058   TSH 0.96 11/21/2015 1047      I, Burt KnackSharon  Martin, am acting as Energy managertranscriptionist for Debbra RidingAlexandria Kadolph, MD  I have reviewed the above documentation for accuracy and completeness, and I agree with the above. - Debbra RidingAlexandria Kadolph, MD

## 2019-02-07 DIAGNOSIS — G4733 Obstructive sleep apnea (adult) (pediatric): Secondary | ICD-10-CM | POA: Diagnosis not present

## 2019-02-10 ENCOUNTER — Ambulatory Visit (INDEPENDENT_AMBULATORY_CARE_PROVIDER_SITE_OTHER): Payer: 59 | Admitting: Family Medicine

## 2019-02-10 ENCOUNTER — Other Ambulatory Visit: Payer: Self-pay

## 2019-02-10 ENCOUNTER — Encounter (INDEPENDENT_AMBULATORY_CARE_PROVIDER_SITE_OTHER): Payer: Self-pay | Admitting: Family Medicine

## 2019-02-10 DIAGNOSIS — Z6841 Body Mass Index (BMI) 40.0 and over, adult: Secondary | ICD-10-CM

## 2019-02-10 DIAGNOSIS — E559 Vitamin D deficiency, unspecified: Secondary | ICD-10-CM | POA: Diagnosis not present

## 2019-02-10 DIAGNOSIS — E8881 Metabolic syndrome: Secondary | ICD-10-CM | POA: Diagnosis not present

## 2019-02-10 NOTE — Progress Notes (Signed)
Office: [phone]  /  Fax: [phone] TeleHealth Visit:  Catherine [name] has verbally consented to this TeleHealth visit today. The patient is located at home, the provider is located at the [facility] office. The participants in this visit include the listed provider and patient. The visit was conducted today via webex.  HPI:   Chief Complaint: OBESITY Catherine [name] is here to discuss her progress with her obesity treatment plan. She is on the Category 4 plan and is following her eating plan approximately 80 % of the time. She states she is walking for 10-15 minutes at work. Catherine [name] weighed in at 412 lbs this morning. She has a hard time getting in all the meat secondary to limitations on how much stores will let you buy. She plans to go to the meat market to be able to buy more meat.  We were unable to weigh the patient today for this TeleHealth visit. She feels as if she has lost 3 lbs since her last visit. She has lost 53-56 lbs since starting treatment with [name].  Vitamin D Deficiency Catherine [name] has a diagnosis of vitamin D deficiency. She is currently taking prescription Vit D. She notes fatigue and denies nausea, vomiting or muscle weakness.  Insulin Resistance Catherine [name] has a diagnosis of insulin resistance based on her elevated fasting insulin level >5. Although Catherine [name]'s blood glucose readings are still under good control, insulin resistance puts her at greater risk of metabolic syndrome and diabetes. She denies carbohydrate cravings and states Catherine [name] is helping with food cravings. She continues to work on diet and exercise to decrease risk of diabetes.  ASSESSMENT AND PLAN:  Vitamin D deficiency  Insulin resistance  Class 3 severe obesity with serious comorbidity and body mass index (BMI) of 60.0 to 69.9 in adult, unspecified obesity type (HCC)  PLAN:  Vitamin D Deficiency Catherine [name] was informed that low vitamin D levels contributes to fatigue and are associated  with obesity, breast, and colon cancer. Catherine [name] agrees to continue taking prescription Vit D ,000 IU every week, no refill needed. She will follow up for routine testing of vitamin D, at least 2-3 times per year. She was informed of the risk of over-replacement of vitamin D and agrees to not increase her dose unless she discusses this with [name] first. Catherine [name] agrees to follow up with our clinic as directed to monitor her progress.  Insulin Resistance Catherine [name] will continue to work on weight loss, exercise, and decreasing simple carbohydrates in her diet to help decrease the risk of diabetes. We dicussed metformin including benefits and risks. She was informed that eating too many simple carbohydrates or too many calories at one sitting increases the likelihood of GI side effects. Catherine [name] agrees to continue [name] and we will need labs repeated at next appointment. Catherine [name] agrees to follow up with our clinic as directed to monitor her progress.  Obesity Catherine [name] is currently in the action stage of change. As such, her goal is to continue with weight loss efforts She has agreed to follow the Category 4 plan Catherine [name] has been instructed to work up to a goal of 150 minutes of combined cardio and strengthening exercise per week for weight loss and overall health benefits. We discussed the following Behavioral Modification Strategies today: increasing lean protein intake, increasing vegetables and work on meal planning and easy cooking plans, better snacking choices, and planning for success   Catherine [name] has agreed to follow up with our clinic as directed to monitor her progress.  She was informed of the importance of frequent follow up visits to maximize her success with intensive lifestyle modifications for her multiple health conditions.  ALLERGIES: Allergies  Allergen Reactions   Citrus Swelling    MEDICATIONS: Current Outpatient Medications on File Prior to Visit  Medication Sig Dispense Refill    aspirin-acetaminophen-caffeine (EXCEDRIN MIGRAINE) 250-250-65 MG per tablet Take 2 tablets by mouth 2 (two) times daily as needed for migraine. For headache      budesonide (PULMICORT) 180 MCG/ACT inhaler Inhale 1 puff into the lungs 2 (two) times daily. (Patient not taking: Reported on 01/04/2019) 1 Inhaler 1   cetirizine (ZYRTEC) 10 MG tablet Take 10 mg by mouth daily.     cyanocobalamin 100 MCG tablet Take 100 mcg by mouth daily.     diclofenac (CATAFLAM) 50 MG tablet Take 50 mg by mouth 2 (two) times daily at 10 AM and 5 PM.     diclofenac sodium (VOLTAREN) 1 % GEL Apply 2 g topically 4 (four) times daily. (Patient not taking: Reported on 01/04/2019) 100 g 1   famotidine (PEPCID) 10 MG tablet Take 1 tablet (10 mg total) by mouth daily. 30 tablet 6   fluticasone (FLONASE) 50 MCG/ACT nasal spray Place 2 sprays into both nostrils daily. 16 g 6   furosemide (LASIX) 20 MG tablet TAKE 1 TABLET (20 MG TOTAL) BY MOUTH 2 TIMES DAILY. 60 tablet 3   gabapentin (NEURONTIN) 300 MG capsule TAKE 2 CAPSULES (600 MG TOTAL) BY MOUTH 3 TIMES A DAY 180 capsule 3   ibuprofen (ADVIL,MOTRIN) 200 MG tablet Take 600-800 mg by mouth every 8 (eight) hours as needed (for pain.).      Insulin Pen Needle (BD PEN NEEDLE NANO U/F) 32G X 4 MM MISC 1 Package by Does not apply route daily at 12 noon. 100 each 0   Liraglutide -Weight Management (SAXENDA) 18 MG/3ML SOPN Inject 3 mg into the skin daily. 5 pen 0   meloxicam (MOBIC) 15 MG tablet Take 1 tablet (15 mg total) by mouth daily. (Patient not taking: Reported on 01/04/2019) 30 tablet 0   pantoprazole (PROTONIX) 40 MG tablet Take 1 tablet (40 mg total) by mouth daily. Take 30 minutes prior to breakfast 30 tablet 3   Phenyleph-Doxylamine-DM-APAP (NYQUIL SEVERE COLD/FLU) 5-6.25-10-325 MG CAPS Take 1 tablet by mouth 2 (two) times daily. 112 capsule 0   polyethylene glycol powder (GLYCOLAX/MIRALAX) powder Take 17 g by mouth daily as needed. 3350 g 1   Vitamin D,  Ergocalciferol, (DRISDOL) 1.25 MG (50000 UT) CAPS capsule Take 1 capsule (50,000 Units total) by mouth every 7 (seven) days. 4 capsule 0   No current facility-administered medications on file prior to visit.     PAST MEDICAL HISTORY: Past Medical History:  Diagnosis Date   Allergy    Anxiety    Arthritis    Asthma    since baby, seasonal   ASTHMA, UNSPECIFIED 12/10/2006   Qualifier: Diagnosis of  By: Abundio MiuMcGregor, Barbara     CELLULITIS AND ABSCESS OF LEG EXCEPT FOOT 12/30/2007   Qualifier: Diagnosis of  By: Daphine DeutscherMartin FNP, [place]     Cyst of bone    right side of spine   DEPENDENT EDEMA, LEGS, BILATERAL 11/22/2008   Qualifier: Diagnosis of  By: Barbaraann Barthelankins MD, [place]     Depression 2003Dx   Edema    Food allergy    citris   FOOT PAIN, RIGHT 11/22/2008   Qualifier: Diagnosis of  By: Barbaraann Barthelankins MD, [place]  Gastric ulcer    GERD (gastroesophageal reflux disease)    Gout    Hypertension    took Norvasc for 3 months, doctor discontinued no longer on BP medications   Joint pain    KNEE INJURY, LEFT 01/01/2009   Qualifier: Diagnosis of  By: Delrae Alfred MD, [place]     Migraine    ONYCHOMYCOSIS, TOENAILS 11/22/2008   Qualifier: Diagnosis of  By: Barbaraann Barthel MD, [place]     Shortness of breath    Sleep apnea    TINEA PEDIS 11/22/2008   Qualifier: Diagnosis of  By: Barbaraann Barthel MD, Benetta Spar      PAST SURGICAL HISTORY: Past Surgical History:  Procedure Laterality Date   DILITATION & CURRETTAGE/HYSTROSCOPY WITH NOVASURE ABLATION N/A 07/03/2017   Procedure: DILATATION & CURETTAGE/HYSTEROSCOPY WITH NOVASURE ABLATION;  Surgeon: Genia Del, MD;  Location: [facility];  Service: Gynecology;  Laterality: N/A;   UPPER GI ENDOSCOPY     WISDOM TOOTH EXTRACTION  2008   x4    SOCIAL HISTORY: Social History   Tobacco Use   Smoking status: Former Smoker    Packs/day: 0.50    Years: 18.00    Pack years: 9.00    Types: Cigarettes    Start date: 10/13/1996    Last attempt to  quit: 01/12/2015    Years since quitting: 4.0   Smokeless tobacco: Never Used  Substance Use Topics   Alcohol use: Yes    Alcohol/week: 1.0 standard drinks    Types: 1 Standard drinks or equivalent per week    Comment: rarely drinks   Drug use: No    Comment: marjuana - former use, quit 4 years ago    FAMILY HISTORY: Family History  Problem Relation Age of Onset   Hypertension Mother    Hyperlipidemia Mother    Cancer Mother        bone cancer   Heart disease Mother    Sudden death Mother    Stroke Mother    Thyroid disease Mother    Sleep apnea Mother    Obesity Mother    Sudden death Father    Hypertension Father    Heart disease Father    Hyperlipidemia Father    Prostate cancer Father    Lung cancer Father    Obesity Father    Alcoholism Father    Sudden death Brother    Hypertension Brother    Hyperlipidemia Brother    Heart attack Brother    Heart disease Brother    Stroke Brother    Alcoholism Brother    Drug abuse Brother    Heart disease Sister    Hypertension Sister    Thyroid cancer Sister    Diabetes Neg Hx    Colon cancer Neg Hx    Stomach cancer Neg Hx    Esophageal cancer Neg Hx    Rectal cancer Neg Hx    Liver cancer Neg Hx     ROS: Review of Systems  Constitutional: Positive for malaise/fatigue and weight loss.  Gastrointestinal: Negative for nausea and vomiting.  Musculoskeletal:       Negative muscle weakness    PHYSICAL EXAM: Pt in no acute distress  RECENT LABS AND TESTS: BMET    Component Value Date/Time   NA 140 09/27/2018 1602   K 4.7 09/27/2018 1602   CL 100 09/27/2018 1602   CO2 24 09/27/2018 1602   GLUCOSE 73 09/27/2018 1602   GLUCOSE 83 06/04/2016 1155   BUN 16 09/27/2018 1602   CREATININE  1.18 (H) 09/27/2018 1602   CREATININE 0.90 06/04/2016 1155   CALCIUM 9.2 09/27/2018 1602   GFRNONAA 59 (L) 09/27/2018 1602   GFRNONAA 84 06/04/2016 1155   GFRAA 69 09/27/2018 1602   GFRAA  >89 06/04/2016 1155   Lab Results  Component Value Date   HGBA1C 5.5 09/28/2018   HGBA1C 5.4 05/24/2018   HGBA1C 5.3 12/10/2017   HGBA1C 5.4 06/25/2017   HGBA1C 5.2 05/19/2017   Lab Results  Component Value Date   INSULIN 29.7 (H) 09/28/2018   INSULIN 28.3 (H) 05/24/2018   INSULIN 43.6 (H) 12/10/2017   INSULIN 30.4 (H) 06/25/2017   INSULIN 29.7 (H) 01/20/2017   CBC    Component Value Date/Time   WBC 10.6 12/10/2017 1242   WBC 13.3 (H) 07/03/2017 0610   RBC 4.64 12/10/2017 1242   RBC 4.33 07/03/2017 0610   HGB 12.5 12/10/2017 1242   HCT 38.4 12/10/2017 1242   PLT 293 07/03/2017 0610   PLT 343 05/18/2017 1611   MCV 83 12/10/2017 1242   MCH 26.9 12/10/2017 1242   MCH 25.9 (L) 07/03/2017 0610   MCHC 32.6 12/10/2017 1242   MCHC 32.4 07/03/2017 0610   RDW 16.2 (H) 12/10/2017 1242   LYMPHSABS 5.2 (H) 12/10/2017 1242   MONOABS 0.6 07/03/2017 0610   EOSABS 0.0 12/10/2017 1242   BASOSABS 0.0 12/10/2017 1242   Iron/TIBC/Ferritin/ %Sat    Component Value Date/Time   IRON 25 (L) 05/18/2017 1611   TIBC 363 05/18/2017 1611   FERRITIN 16 05/18/2017 1611   IRONPCTSAT 7 (LL) 05/18/2017 1611   Lipid Panel     Component Value Date/Time   CHOL 146 05/24/2018 1044   TRIG 53 05/24/2018 1044   HDL 41 05/24/2018 1044   CHOLHDL 3.6 11/21/2015 1047   VLDL 10 11/21/2015 1047   LDLCALC 94 05/24/2018 1044   Hepatic Function Panel     Component Value Date/Time   PROT 7.3 05/24/2018 1044   ALBUMIN 4.1 05/24/2018 1044   AST 29 05/24/2018 1044   ALT 24 05/24/2018 1044   ALKPHOS 70 05/24/2018 1044   BILITOT 0.3 05/24/2018 1044      Component Value Date/Time   TSH 1.43 05/19/2017 1245   TSH 1.460 10/27/2016 1058   TSH 0.96 11/21/2015 1047      I, Burt Knack, am acting as Energy manager for Debbra Riding, MD  I have reviewed the above documentation for accuracy and completeness, and I agree with the above. Debbra Riding, MD'

## 2019-02-15 MED FILL — FUROSEMIDE 20 MG TABS: 20 | 30 days supply | Qty: 60 | Fill #0

## 2019-02-23 ENCOUNTER — Ambulatory Visit (INDEPENDENT_AMBULATORY_CARE_PROVIDER_SITE_OTHER): Payer: 59 | Admitting: Family Medicine

## 2019-02-24 ENCOUNTER — Other Ambulatory Visit: Payer: Self-pay

## 2019-02-24 ENCOUNTER — Ambulatory Visit (INDEPENDENT_AMBULATORY_CARE_PROVIDER_SITE_OTHER): Payer: 59 | Admitting: Bariatrics

## 2019-02-24 DIAGNOSIS — E559 Vitamin D deficiency, unspecified: Secondary | ICD-10-CM

## 2019-02-24 DIAGNOSIS — Z6841 Body Mass Index (BMI) 40.0 and over, adult: Secondary | ICD-10-CM | POA: Diagnosis not present

## 2019-02-24 MED ORDER — VITAMIN D (ERGOCALCIFEROL) 1.25 MG (50000 UNIT) PO CAPS: 50000.0000 [IU] | ORAL_CAPSULE | ORAL | 0 refills | Status: DC

## 2019-02-24 MED ORDER — LIRAGLUTIDE -WEIGHT MANAGEMENT 18 MG/3ML ~~LOC~~ SOPN: 3.0000 mg | PEN_INJECTOR | Freq: Every day | SUBCUTANEOUS | 0 refills | Status: DC

## 2019-02-24 MED FILL — SAXENDA 18 MG/3 ML PEN: 18 | 30 days supply | Qty: 15 | Fill #0

## 2019-02-24 MED FILL — VIT D2 1.25 MG (50,000 UNIT: 1.25 MG | 28 days supply | Qty: 4 | Fill #0

## 2019-02-28 ENCOUNTER — Encounter (INDEPENDENT_AMBULATORY_CARE_PROVIDER_SITE_OTHER): Payer: Self-pay | Admitting: Bariatrics

## 2019-02-28 NOTE — Progress Notes (Signed)
Office: [phone]  /  Fax: [phone] TeleHealth Visit:  Catherine [name] has verbally consented to this TeleHealth visit today. The patient is located at home, the provider is located at the [facility] office. The participants in this visit include the listed provider and patient and any and all parties involved. The visit was conducted today via WebEx.  HPI:   Chief Complaint: OBESITY Catherine [name] is here to discuss her progress with her obesity treatment plan. She is on the Category 4 plan and is following her eating plan approximately 85 % of the time. She states she is exercising 0 minutes 0 times per week. Catherine [name] states that her weight has remained the same. She is taking Saxenda (full dose) and she denies any side effects. We were unable to weigh the patient today for this TeleHealth visit. She feels as if she has maintained weight since her last visit. She has lost 53 to 56 lbs since starting treatment with [name].  Vitamin D deficiency Catherine [name] has a diagnosis of vitamin D deficiency. She is currently taking vit D and denies nausea, vomiting or muscle weakness.  Insulin Resistance Catherine [name] has a diagnosis of insulin resistance based on her elevated fasting insulin level >5. Although Tacoya's blood glucose readings are still under good control, insulin resistance puts her at greater risk of metabolic syndrome and diabetes. Her last A1c was at 5.5 and last insulin level was at 29.7 She continues to work on diet and exercise to decrease risk of diabetes. Catherine [name] denies polyphagia.  ASSESSMENT AND PLAN:  Vitamin D deficiency - Plan: Vitamin D, Ergocalciferol, (DRISDOL) 1.25 MG (50000 UT) CAPS capsule  Class 3 severe obesity with serious comorbidity and body mass index (BMI) of 60.0 to 69.9 in adult, unspecified obesity type (HCC) - Plan: Liraglutide -Weight Management (SAXENDA) 18 MG/3ML SOPN  PLAN:  Vitamin D Deficiency Lucee was informed that low vitamin D levels  contributes to fatigue and are associated with obesity, breast, and colon cancer. She agrees to continue to take prescription Vit D ,000 IU every week #4 with no refills and will follow up for routine testing of vitamin D, at least 2-3 times per year. She was informed of the risk of over-replacement of vitamin D and agrees to not increase her dose unless she discusses this with [name] first. Britteny agrees to follow up with our clinic in 2 weeks.  Insulin Resistance Catherine [name] will continue to work on weight loss, increasing lean protein and decreasing simple carbohydrates in her diet to help decrease the risk of diabetes. She will consider exercise. Catherine [name] was informed that eating too many simple carbohydrates or too many calories at one sitting increases the likelihood of GI side effects. Catherine [name] agreed to continue Saxenda 18 mg/3 ml 3.0 mg daily #5 pens with no refills and follow up with [name] as directed to monitor her progress.  Obesity Catherine [name] is currently in the action stage of change. As such, her goal is to continue with weight loss efforts She has agreed to follow the Category 4 plan Catherine [name] will continue walking and she will increase walking for weight loss and overall health benefits. We discussed the following Behavioral Modification Strategies today: increase H2O intake, no skipping meals, keeping healthy foods in the home, increasing lean protein intake, decreasing simple carbohydrates, increasing vegetables, decrease eating out and work on meal planning and easy cooking plans Catherine [name] will weigh herself at home and record before each visit. She will do phone application.  Catherine [name] has  agreed to follow up with our clinic in 2 weeks. She was informed of the importance of frequent follow up visits to maximize her success with intensive lifestyle modifications for her multiple health conditions.  ALLERGIES: Allergies  Allergen Reactions   Citrus Swelling    MEDICATIONS: Current Outpatient  Medications on File Prior to Visit  Medication Sig Dispense Refill   aspirin-acetaminophen-caffeine (EXCEDRIN MIGRAINE) 250-250-65 MG per tablet Take 2 tablets by mouth 2 (two) times daily as needed for migraine. For headache      budesonide (PULMICORT) 180 MCG/ACT inhaler Inhale 1 puff into the lungs 2 (two) times daily. (Patient not taking: Reported on 01/04/2019) 1 Inhaler 1   cetirizine (ZYRTEC) 10 MG tablet Take 10 mg by mouth daily.     cyanocobalamin 100 MCG tablet Take 100 mcg by mouth daily.     diclofenac (CATAFLAM) 50 MG tablet Take 50 mg by mouth 2 (two) times daily at 10 AM and 5 PM.     diclofenac sodium (VOLTAREN) 1 % GEL Apply 2 g topically 4 (four) times daily. (Patient not taking: Reported on 01/04/2019) 100 g 1   famotidine (PEPCID) 10 MG tablet Take 1 tablet (10 mg total) by mouth daily. 30 tablet 6   fluticasone (FLONASE) 50 MCG/ACT nasal spray Place 2 sprays into both nostrils daily. 16 g 6   furosemide (LASIX) 20 MG tablet TAKE 1 TABLET (20 MG TOTAL) BY MOUTH 2 TIMES DAILY. 60 tablet 3   gabapentin (NEURONTIN) 300 MG capsule TAKE 2 CAPSULES (600 MG TOTAL) BY MOUTH 3 TIMES A DAY 180 capsule 3   ibuprofen (ADVIL,MOTRIN) 200 MG tablet Take 600-800 mg by mouth every 8 (eight) hours as needed (for pain.).      Insulin Pen Needle (BD PEN NEEDLE NANO U/F) 32G X 4 MM MISC 1 Package by Does not apply route daily at 12 noon. 100 each 0   meloxicam (MOBIC) 15 MG tablet Take 1 tablet (15 mg total) by mouth daily. (Patient not taking: Reported on 01/04/2019) 30 tablet 0   pantoprazole (PROTONIX) 40 MG tablet Take 1 tablet (40 mg total) by mouth daily. Take 30 minutes prior to breakfast 30 tablet 3   Phenyleph-Doxylamine-DM-APAP (NYQUIL SEVERE COLD/FLU) 5-6.25-10-325 MG CAPS Take 1 tablet by mouth 2 (two) times daily. 112 capsule 0   polyethylene glycol powder (GLYCOLAX/MIRALAX) powder Take 17 g by mouth daily as needed. 3350 g 1   No current facility-administered medications  on file prior to visit.     PAST MEDICAL HISTORY: Past Medical History:  Diagnosis Date   Allergy    Anxiety    Arthritis    Asthma    since baby, seasonal   ASTHMA, UNSPECIFIED 12/10/2006   Qualifier: Diagnosis of  By: Abundio MiuMcGregor, Barbara     CELLULITIS AND ABSCESS OF LEG EXCEPT FOOT 12/30/2007   Qualifier: Diagnosis of  By: Daphine DeutscherMartin FNP, [place]     Cyst of bone    right side of spine   DEPENDENT EDEMA, LEGS, BILATERAL 11/22/2008   Qualifier: Diagnosis of  By: Barbaraann Barthelankins MD, [place]     Depression [id number]   Edema    Food allergy    citris   FOOT PAIN, RIGHT 11/22/2008   Qualifier: Diagnosis of  By: Barbaraann Barthelankins MD, [place]     Gastric ulcer    GERD (gastroesophageal reflux disease)    Gout    Hypertension    took Norvasc for 3 months, doctor discontinued no longer on BP medications   Joint  pain    KNEE INJURY, LEFT 01/01/2009   Qualifier: Diagnosis of  By: Delrae Alfred MD, Elizabeth     Migraine    ONYCHOMYCOSIS, TOENAILS 11/22/2008   Qualifier: Diagnosis of  By: Barbaraann Barthel MD, [place]     Shortness of breath    Sleep apnea    TINEA PEDIS 11/22/2008   Qualifier: Diagnosis of  By: Barbaraann Barthel MD, Benetta Spar      PAST SURGICAL HISTORY: Past Surgical History:  Procedure Laterality Date   DILITATION & CURRETTAGE/HYSTROSCOPY WITH NOVASURE ABLATION N/A 07/03/2017   Procedure: DILATATION & CURETTAGE/HYSTEROSCOPY WITH NOVASURE ABLATION;  Surgeon: Genia Del, MD;  Location: [facility];  Service: Gynecology;  Laterality: N/A;   UPPER GI ENDOSCOPY     WISDOM TOOTH EXTRACTION  2008   x4    SOCIAL HISTORY: Social History   Tobacco Use   Smoking status: Former Smoker    Packs/day: 0.50    Years: 18.00    Pack years: 9.00    Types: Cigarettes    Start date: 10/13/1996    Last attempt to quit: 01/12/2015    Years since quitting: 4.1   Smokeless tobacco: Never Used  Substance Use Topics   Alcohol use: Yes    Alcohol/week: 1.0 standard drinks    Types: 1 Standard  drinks or equivalent per week    Comment: rarely drinks   Drug use: No    Comment: marjuana - former use, quit 4 years ago    FAMILY HISTORY: Family History  Problem Relation Age of Onset   Hypertension Mother    Hyperlipidemia Mother    Cancer Mother        bone cancer   Heart disease Mother    Sudden death Mother    Stroke Mother    Thyroid disease Mother    Sleep apnea Mother    Obesity Mother    Sudden death Father    Hypertension Father    Heart disease Father    Hyperlipidemia Father    Prostate cancer Father    Lung cancer Father    Obesity Father    Alcoholism Father    Sudden death Brother    Hypertension Brother    Hyperlipidemia Brother    Heart attack Brother    Heart disease Brother    Stroke Brother    Alcoholism Brother    Drug abuse Brother    Heart disease Sister    Hypertension Sister    Thyroid cancer Sister    Diabetes Neg Hx    Colon cancer Neg Hx    Stomach cancer Neg Hx    Esophageal cancer Neg Hx    Rectal cancer Neg Hx    Liver cancer Neg Hx     ROS: Review of Systems  Constitutional: Negative for weight loss.  Gastrointestinal: Negative for nausea and vomiting.  Musculoskeletal:       Negative for muscle weakness  Endo/Heme/Allergies:       Negative for polyphagia    PHYSICAL EXAM: Pt in no acute distress  RECENT LABS AND TESTS: BMET    Component Value Date/Time   NA 140 09/27/2018 1602   K 4.7 09/27/2018 1602   CL 100 09/27/2018 1602   CO2 24 09/27/2018 1602   GLUCOSE 73 09/27/2018 1602   GLUCOSE 83 06/04/2016 1155   BUN 16 09/27/2018 1602   CREATININE 1.18 (H) 09/27/2018 1602   CREATININE 0.90 06/04/2016 1155   CALCIUM 9.2 09/27/2018 1602   GFRNONAA 59 (L) 09/27/2018 1602  GFRNONAA 84 06/04/2016 1155   GFRAA 69 09/27/2018 1602   GFRAA >89 06/04/2016 1155   Lab Results  Component Value Date   HGBA1C 5.5 09/28/2018   HGBA1C 5.4 05/24/2018   HGBA1C 5.3 12/10/2017   HGBA1C  5.4 06/25/2017   HGBA1C 5.2 05/19/2017   Lab Results  Component Value Date   INSULIN 29.7 (H) 09/28/2018   INSULIN 28.3 (H) 05/24/2018   INSULIN 43.6 (H) 12/10/2017   INSULIN 30.4 (H) 06/25/2017   INSULIN 29.7 (H) 01/20/2017   CBC    Component Value Date/Time   WBC 10.6 12/10/2017 1242   WBC 13.3 (H) 07/03/2017 0610   RBC 4.64 12/10/2017 1242   RBC 4.33 07/03/2017 0610   HGB 12.5 12/10/2017 1242   HCT 38.4 12/10/2017 1242   PLT 293 07/03/2017 0610   PLT 343 05/18/2017 1611   MCV 83 12/10/2017 1242   MCH 26.9 12/10/2017 1242   MCH 25.9 (L) 07/03/2017 0610   MCHC 32.6 12/10/2017 1242   MCHC 32.4 07/03/2017 0610   RDW 16.2 (H) 12/10/2017 1242   LYMPHSABS 5.2 (H) 12/10/2017 1242   MONOABS 0.6 07/03/2017 0610   EOSABS 0.0 12/10/2017 1242   BASOSABS 0.0 12/10/2017 1242   Iron/TIBC/Ferritin/ %Sat    Component Value Date/Time   IRON 25 (L) 05/18/2017 1611   TIBC 363 05/18/2017 1611   FERRITIN 16 05/18/2017 1611   IRONPCTSAT 7 (LL) 05/18/2017 1611   Lipid Panel     Component Value Date/Time   CHOL 146 05/24/2018 1044   TRIG 53 05/24/2018 1044   HDL 41 05/24/2018 1044   CHOLHDL 3.6 11/21/2015 1047   VLDL 10 11/21/2015 1047   LDLCALC 94 05/24/2018 1044   Hepatic Function Panel     Component Value Date/Time   PROT 7.3 05/24/2018 1044   ALBUMIN 4.1 05/24/2018 1044   AST 29 05/24/2018 1044   ALT 24 05/24/2018 1044   ALKPHOS 70 05/24/2018 1044   BILITOT 0.3 05/24/2018 1044      Component Value Date/Time   TSH 1.43 05/19/2017 1245   TSH 1.460 10/27/2016 1058   TSH 0.96 11/21/2015 1047     Ref. Range 09/28/2018 09:09  Vitamin D, 25-Hydroxy Latest Ref Range: 30.0 - 100.0 ng/mL 25.6 (L)    I, [place] Crane, am acting as Energy manager for [employer]. Manson Passey, DO  I have reviewed the above documentation for accuracy and completeness, and I agree with the above. -Corinna Capra, DO

## 2019-03-01 ENCOUNTER — Telehealth: Payer: 59 | Admitting: Physician Assistant

## 2019-03-01 ENCOUNTER — Other Ambulatory Visit: Payer: Self-pay | Admitting: Physician Assistant

## 2019-03-01 DIAGNOSIS — H9203 Otalgia, bilateral: Secondary | ICD-10-CM | POA: Diagnosis not present

## 2019-03-01 MED ORDER — CIPROFLOXACIN-DEXAMETHASONE 0.3-0.1 % OT SUSP: 4.0000 [drp] | Freq: Two times a day (BID) | OTIC | 0 refills | Status: DC

## 2019-03-01 MED FILL — CIPRODEX OTIC SUSPENSION: 0.3-0.1 | 10 days supply | Qty: 8 | Fill #0

## 2019-03-01 NOTE — Progress Notes (Signed)
Please submit an ear complaint evisit so that I can fully evaluate the scope of your ear complaint. Deliah Boston, MS, PA-C 10:09 AM, 03/01/2019  ===View-only below this line===   ----- Message -----    From: Catherine [name]    Sent: 03/01/2019  9:42 AM EDT      To: E-Visit Mailing List Subject: E-Visit Submission: Sinus Problems  E-Visit Submission: Sinus Problems --------------------------------  Question: Which of the following have you been experiencing? Answer:   Ear pain  Question: Have these symptoms significantly worsened over the last two to three days? Answer:   Yes  Question: Have you had any of the following? Answer:   None of the above  Question: How long have you been having these symptoms? Answer:   4 days  Question: Do you have a fever? Answer:   No, I do not have a fever  Question: Do you smoke? Answer:   No  Question: Have you ever smoked? Answer:   I smoked in the past, but quit  Question: Do you have any chronic illnesses, such as diabetes, heart disease, kidney disease, or lung disease, or any illness that would weaken your body's ability to fight infection? Answer:   No  Question: When you blow your nose, what color is the mucus? Answer:   Mostly clear  Question: Have you experienced similar problems in the past? Answer:   Yes  Question: What treatments have worked in the past?  Answer:   Ear drops prescribed by my doctor.  Question: What treatment(s) in the past have been unsuccessful? Answer:   Over the counter ear drops.  Question: Is this illness similar to previous illnesses you have had?  How is it the same?  How is it different? Answer:   YES. I have ear infections with my sinus infections.  Question: Have you recently been hospitalized? Answer:   No  Question: What medications are you currently taking for these symptoms? Answer:   Decongestants            Pain medicine            Nose spray            Antibiotics  Question:  Please enter the names of any medications you are taking, or any other treatments you are trying. Answer:   rexall ear drops            Just finished antibiotics (Amoxicillin)            Tylenol             Ibuprofen  Question: Are you pregnant? Answer:   I am confident that I am not pregnant  Question: Are you breastfeeding? Answer:   No  Question: Please list your medication allergies that you may have ? (If 'none' , please list as 'none') Answer:   none  Question: Please list any additional comments  Answer:   The ear drops that have been prescribed in the past have worked.

## 2019-03-01 NOTE — Progress Notes (Signed)
E Visit for Swimmer's Ear  We are sorry that you are not feeling well. Here is how we plan to help!  Based on what you have shared with me it looks like you have swimmers ear. Swimmer's ear is a redness or swelling, irritation, or infection of your outer ear canal.  These symptoms usually occur within a few days of swimming.  Your ear canal is a tube that goes from the opening of the ear to the eardrum.  When water stays in your ear canal, germs can grow.  This is a painful condition that often happens to children and swimmers of all ages.  It is not contagious and oral antibiotics are not required to treat uncomplicated swimmer's ear.  The usual symptoms include: Itching inside the ear, Redness or a sense of swelling in the ear, Pain when the ear is tugged on when pressure is placed on the ear, Pus draining from the infected ear. and I have prescribed: Ciprofloxin 0.2% and hydrocortisone 1% otic suspension 3 drops in affected ears twice daily for 7 days    In certain cases swimmer's ear may progress to a more serious bacterial infection of the middle or inner ear.  If you have a fever 102 and up and significantly worsening symptoms, this could indicate a more serious infection moving to the middle/inner and needs face to face evaluation in an office by a provider.  Your symptoms should improve over the next 3 days and should resolve in about 7 days.  HOME CARE:  Wash your hands frequently. Do not place the tip of the bottle on your ear or touch it with your fingers. You can take Acetominophen 650 mg every 4-6 hours as needed for pain.  If pain is severe or moderate, you can apply a heating pad (set on low) or hot water bottle (wrapped in a towel) to outer ear for 20 minutes.  This will also increase drainage. Avoid ear plugs Do not use Q-tips After showers, help the water run out by tilting your head to one side.  GET HELP RIGHT AWAY IF:  Fever is over 102.2 degrees. You develop progressive  ear pain or hearing loss. Ear symptoms persist longer than 3 days after treatment.  MAKE SURE YOU:  Understand these instructions. Will watch your condition. Will get help right away if you are not doing well or get worse.  TO PREVENT SWIMMER'S EAR: Use a bathing cap or custom fitted swim molds to keep your ears dry. Towel off after swimming to dry your ears. Tilt your head or pull your earlobes to allow the water to escape your ear canal. If there is still water in your ears, consider using a hairdryer on the lowest setting.  Thank you for choosing an e-visit. Your e-visit answers were reviewed by a board certified advanced clinical practitioner to complete your personal care plan. Depending upon the condition, your plan could have included both over the counter or prescription medications. Please review your pharmacy choice. Be sure that the pharmacy you have chosen is open so that you can pick up your prescription now.  If there is a problem you may message your provider in MyChart to have the prescription routed to another pharmacy. Your safety is important to us. If you have drug allergies check your prescription carefully.  For the next 24 hours, you can use MyChart to ask questions about today's visit, request a non-urgent call back, or ask for a work or school excuse from  your e-visit provider. You will get an email in the next two days asking about your experience. I hope that your e-visit has been valuable and will speed your recovery.       ===View-only below this line===   ----- Message -----    From: [name]    Sent: 03/01/2019 10:18 AM EDT      To: E-Visit Mailing List Subject: E-Visit Submission: Swimmer's Ear  E-Visit Submission: Swimmer's Ear --------------------------------  Question: How long have you experienced these symptoms? Answer:   Three or more days  Question: Which ear is causing you discomfort? Answer:   Both  Question: Have you  experienced drainage from your ear? Answer:   No  Question: Do you experience pain when touching or wiggling your earlobe? Answer:   Yes  Question: If yes you have pain, please rate pain on a scale of 1-10 (range: 1 - 10) Answer:   5  Question: Are your glands swollen in your neck? Answer:   No  Question: Does your ear feel plugged or any hearing loss? (Check all that apply) Answer:   Ear feels plugged  Question: Do you have a fever? Answer:   No  Question: Any pain when you blow your nose? Answer:   No  Question: Have you tried any over the counter ear drops? Answer:   Yes  Question: If yes, you have tried over the counter ear drops please list below: Answer:   rexall ear drops  Question: Have the over the counter medications helped your symptoms? Answer:   No  Question: Please list your medication allergies that you may have ? (If 'none' , please list as 'none') Answer:   none  Question: Please list any additional comments  Answer:   I was recently on an antibiotic for sinusitis.  Stopped antibiotic last week. Ear drops that were prescribed by Dr in past have worked.  Question: Are you pregnant? Answer:   I am confident that I am not pregnant  Question: Are you breastfeeding? Answer:   No  A total of 5-10 minutes was spent evaluating this patients questionnaire and formulating a plan of care.

## 2019-03-05 ENCOUNTER — Encounter (INDEPENDENT_AMBULATORY_CARE_PROVIDER_SITE_OTHER): Payer: Self-pay | Admitting: Family Medicine

## 2019-03-05 IMAGING — MR MR LUMBAR SPINE WO/W CM
4 of 7 series · 17 of 48 positions shown · IV contrast (20 ml multihance)
Comparison: Radiography 02/12/2007

CLINICAL DATA: Two-month history of right leg pain and weakness.

EXAM:
MRI LUMBAR SPINE WITHOUT AND WITH CONTRAST
TECHNIQUE: Multiplanar and multiecho pulse sequences of the lumbar spine were
obtained without and with intravenous contrast.
CONTRAST:  20mL MULTIHANCE GADOBENATE DIMEGLUMINE 529 MG/ML IV SOLN

[Series 6: T1 · sagittal · 4.0mm · 0.73mm/px · 3 of 15 slices shown (1 of 2)]
[im 1/15]
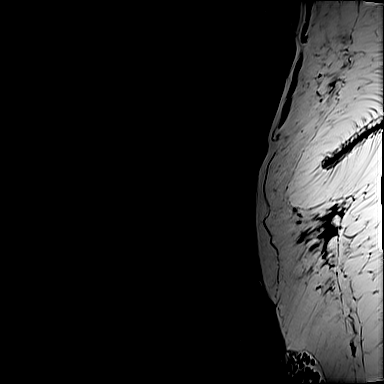
[im 8/15]
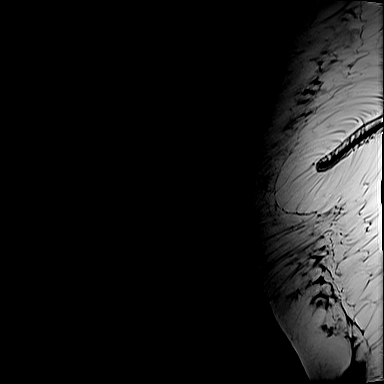
[im 15/15]
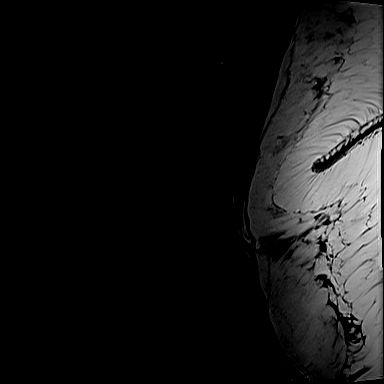

[Series 10: T1 · axial · 4.0mm · 0.28mm/px · z∈[-101,+66]mm · 3 of 38 slices shown (2 of 2)]
[im 5/38]
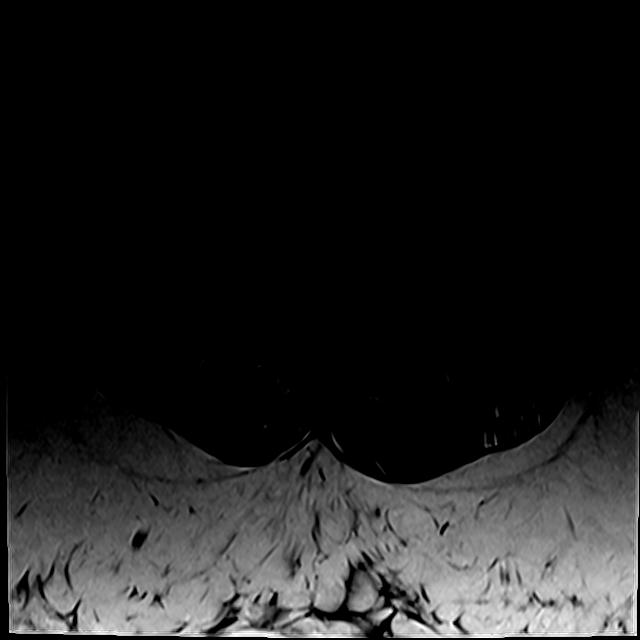
[im 21/38]
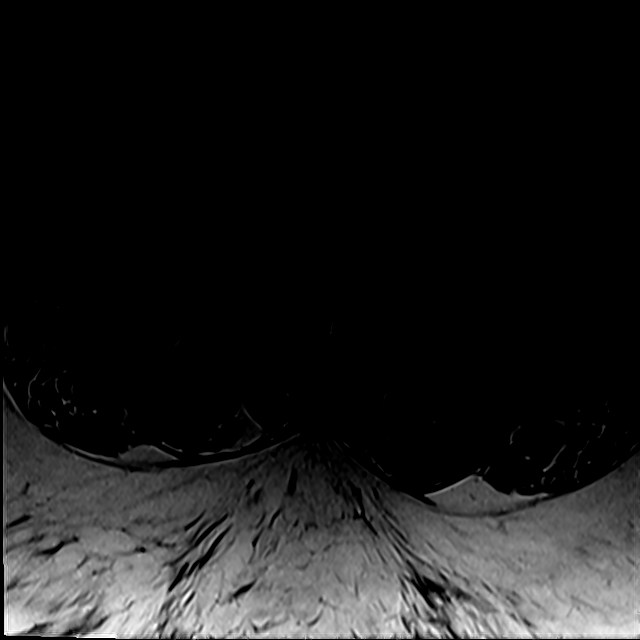
[im 33/38]
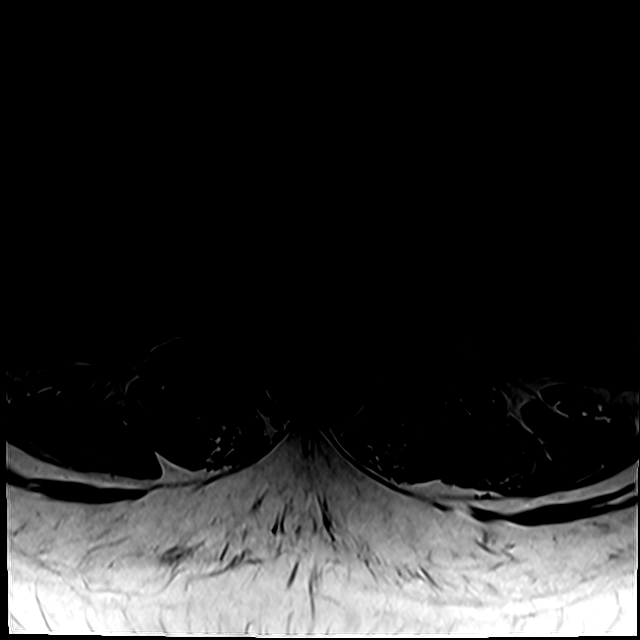

[Series 13: T2 · axial · 4.0mm · 0.28mm/px · z∈[-121,+91]mm · 8 of 38 slices shown (1 of 2)]
[im 1/38]
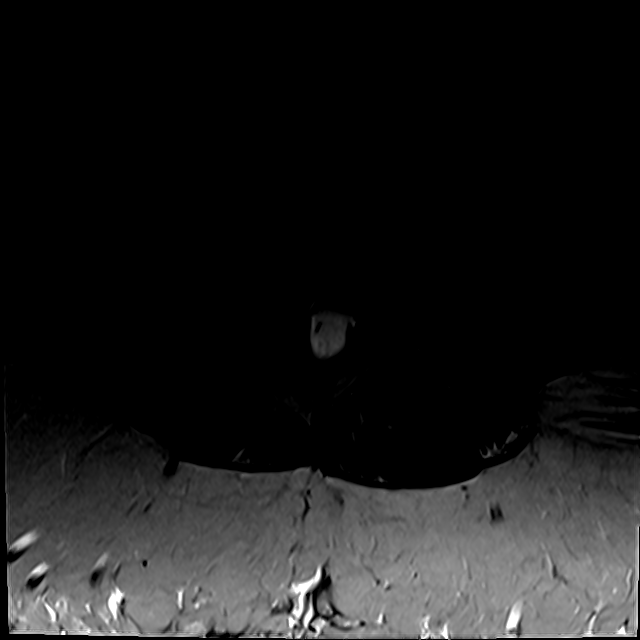
[im 5/38]
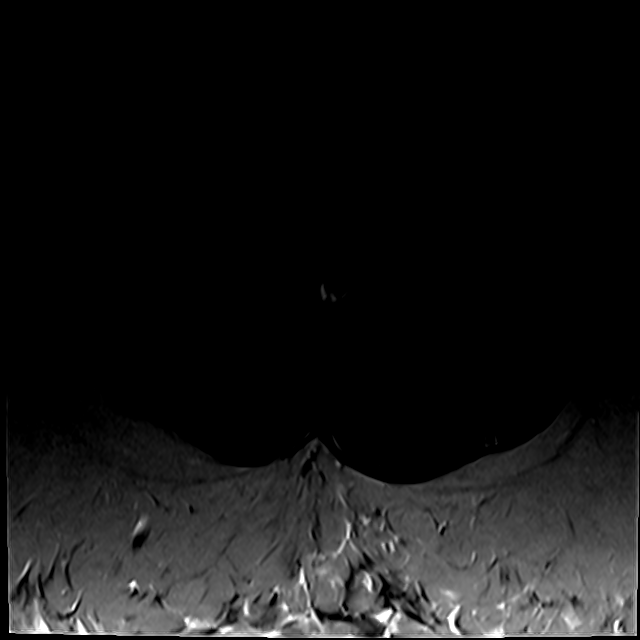
[im 13/38]
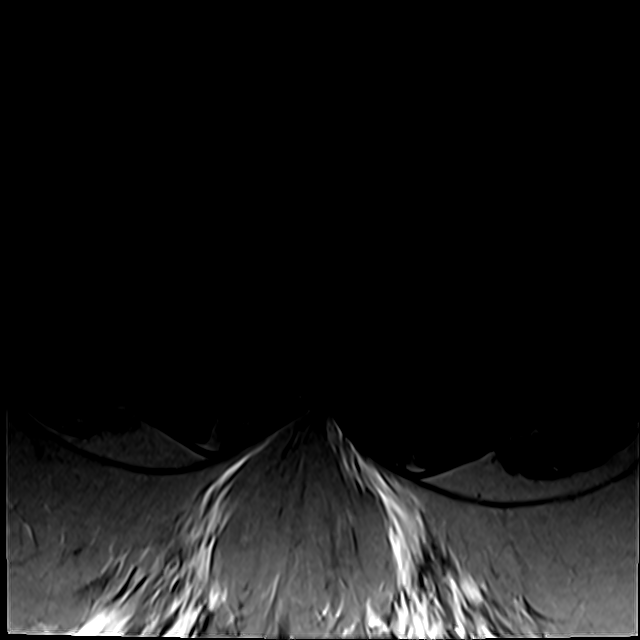
[im 17/38]
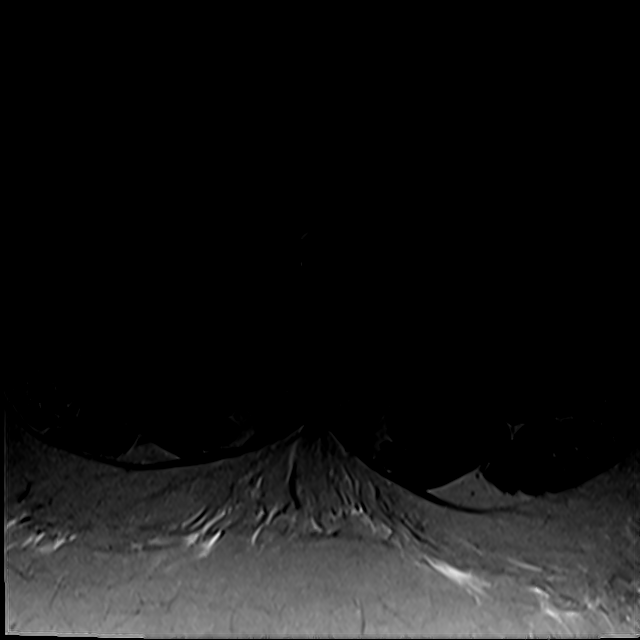
[im 21/38]
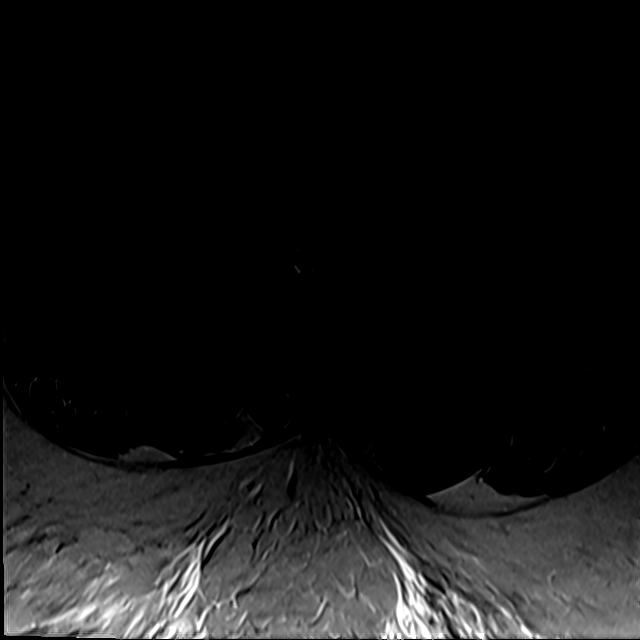
[im 25/38]
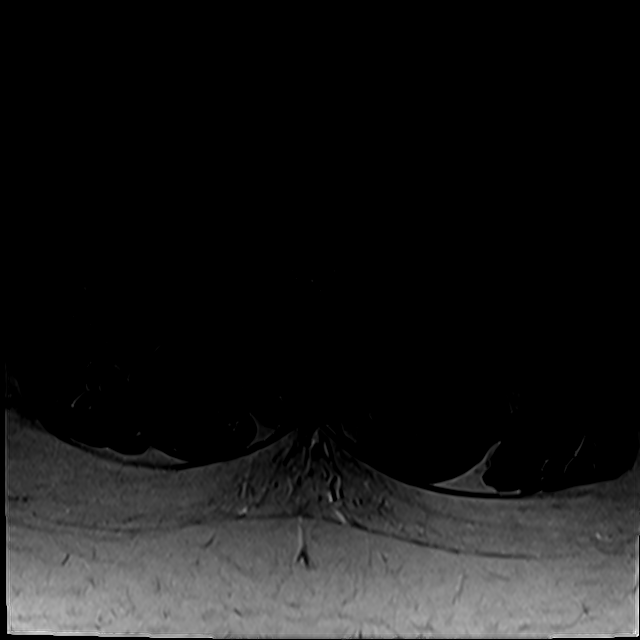
[im 33/38]
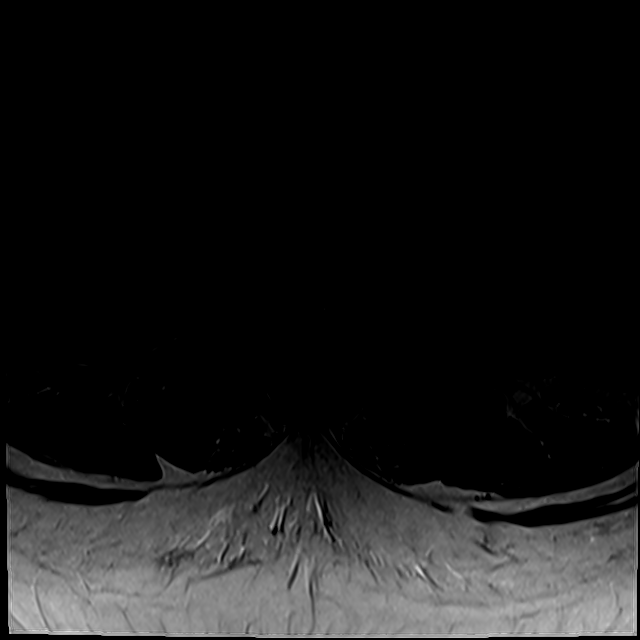
[im 38/38]
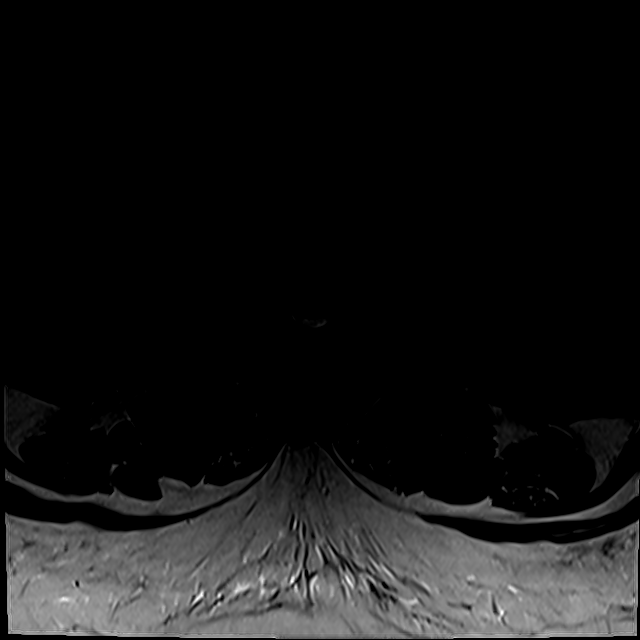

[Series 14: T2 · sagittal · 4.0mm · 0.73mm/px · 3 of 15 slices shown (2 of 2)]
[im 1/15]
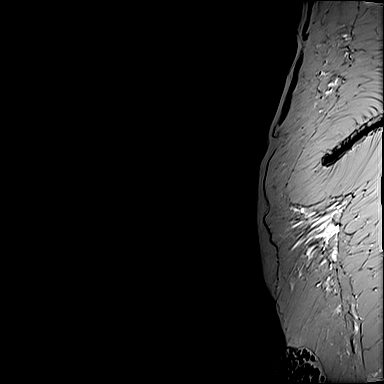
[im 10/15]
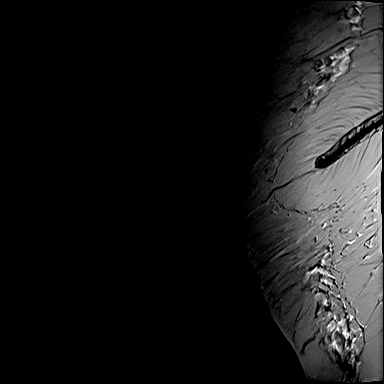
[im 15/15]
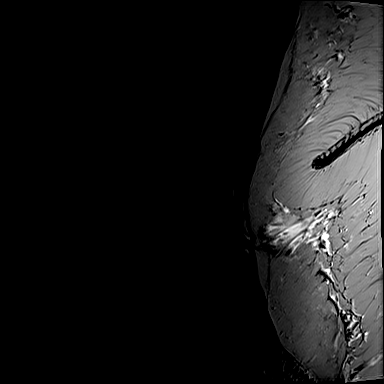

[17 of 48 positions shown; findings below may reference images not displayed]

FINDINGS: Segmentation:  5 lumbar type vertebral bodies.

Alignment:  Normal

Vertebrae:  No fracture or primary bone lesion.

Conus medullaris: Extends to the L2 level and appears normal.

Paraspinal and other soft tissues: Normal

Disc levels:

No abnormality at L3-4 or above. The discs are normal. The canal and
foramina are widely patent.

L4-5: Minimal desiccation of the disc. No bulge or herniation.
Bilateral facet arthropathy. No slippage or stenosis.

L5-S1: Normal appearance of the disc. Bilateral facet arthropathy.
14 mm synovial cyst projecting inward from the facet on the right,
causing stenosis of the right lateral recess and likely compressing
the right S1 nerve.
IMPRESSION: L4-5: Bilateral facet arthropathy without stenosis. This could be a
cause of back pain or referred facet syndrome pain. Mild disc
desiccation without bulge or herniation.

L5-S1: Bilateral facet arthropathy. 14 mm synovial cyst projecting
inward from the facet on the right, compressing the right S1 nerve.

## 2019-03-08 NOTE — Telephone Encounter (Signed)
Please review

## 2019-03-09 DIAGNOSIS — G4733 Obstructive sleep apnea (adult) (pediatric): Secondary | ICD-10-CM | POA: Diagnosis not present

## 2019-03-14 ENCOUNTER — Ambulatory Visit (INDEPENDENT_AMBULATORY_CARE_PROVIDER_SITE_OTHER): Payer: 59 | Admitting: Family Medicine

## 2019-03-17 ENCOUNTER — Encounter (INDEPENDENT_AMBULATORY_CARE_PROVIDER_SITE_OTHER): Payer: Self-pay | Admitting: Family Medicine

## 2019-03-17 ENCOUNTER — Ambulatory Visit (INDEPENDENT_AMBULATORY_CARE_PROVIDER_SITE_OTHER): Payer: 59 | Admitting: Family Medicine

## 2019-03-17 ENCOUNTER — Other Ambulatory Visit: Payer: Self-pay

## 2019-03-17 DIAGNOSIS — Z6841 Body Mass Index (BMI) 40.0 and over, adult: Secondary | ICD-10-CM | POA: Diagnosis not present

## 2019-03-17 DIAGNOSIS — E8881 Metabolic syndrome: Secondary | ICD-10-CM | POA: Diagnosis not present

## 2019-03-17 NOTE — Progress Notes (Signed)
Office: [phone]  /  Fax: [phone] TeleHealth Visit:  Catherine [name] has verbally consented to this TeleHealth visit today. The patient is located at home, the provider is located at the [facility] office. The participants in this visit include the listed provider and patient. The visit was conducted today via Webex.  HPI:   Chief Complaint: OBESITY Catherine [name] is here to discuss her progress with her obesity treatment plan. She is on the Category 4 plan and is following her eating plan approximately 75 % of the time. She states she is exercising 0 minutes 0 times per week. Catherine [name] has gained about 5 pounds since COVID19 isolation started. She has gotten back on track and has done well maintaining her weight. Catherine [name] cannot go to the gym currently and has tried to increase walking, but only a little. She denies nausea, vomiting, or hypoglycemia on Saxenda.  We were unable to weigh the patient today for this TeleHealth visit. She feels as if she has gained weight since her last visit. She has lost 53 lbs since starting treatment with [name].  Insulin Resistance Catherine [name] has a diagnosis of insulin resistance based on her elevated fasting insulin level >5. Although Catherine [name]'s blood glucose readings are still under good control, insulin resistance puts her at greater risk of metabolic syndrome and diabetes. Her last A1c was 5.5 on 09/28/18. She is taking [name] and is working on diet and exercise to decrease risk of diabetes. Her exercise has decreased since the gyms are closed for COVID19. She denies nausea, vomiting, or hypoglycemia.  ASSESSMENT AND PLAN:  Insulin resistance  Class 3 severe obesity with serious comorbidity and body mass index (BMI) of 60.0 to 69.9 in adult, unspecified obesity type (HCC)  PLAN:  Insulin Resistance Catherine [name] will continue to work on weight loss, exercise, and decreasing simple carbohydrates in her diet to help decrease the risk of diabetes. She  was informed that eating too many simple carbohydrates or too many calories at one sitting increases the likelihood of GI side effects. Catherine [name] agreed to continue Saxenda, her diet, and to increase exercise. We will get labs when it is safe for her to come back into the office. Catherine [name] agreed to follow up with [name] as directed to monitor her progress in 2 weeks.  I spent > than 50% of the 15 minute visit on counseling as documented in the note.  Obesity Catherine [name] is currently in the action stage of change. As such, her goal is to continue with weight loss efforts. She has agreed to follow the Category 4 plan or our protein rich vegetarian plan. Catherine [name] has been instructed to work up to a goal of 150 minutes of combined cardio and strengthening exercise per week for weight loss and overall health benefits. We discussed the following Behavioral Modification Strategies today: work on meal planning and easy cooking plans and ways to avoid boredom eating. We discussed various medication options to help Catherine [name] with her weight loss efforts and we both agreed to continue Saxenda 3.0 mg and to follow up as directed.  Catherine [name] has agreed to follow up with our clinic in 2 weeks. She was informed of the importance of frequent follow up visits to maximize her success with intensive lifestyle modifications for her multiple health conditions.  ALLERGIES: Allergies  Allergen Reactions   Citrus Swelling    MEDICATIONS: Current Outpatient Medications on File Prior to Visit  Medication Sig Dispense Refill   aspirin-acetaminophen-caffeine (EXCEDRIN MIGRAINE) 250-250-65 MG per tablet  Take 2 tablets by mouth 2 (two) times daily as needed for migraine. For headache      budesonide (PULMICORT) 180 MCG/ACT inhaler Inhale 1 puff into the lungs 2 (two) times daily. (Patient not taking: Reported on 01/04/2019) 1 Inhaler 1   cetirizine (ZYRTEC) 10 MG tablet Take 10 mg by mouth daily.     ciprofloxacin-dexamethasone  (CIPRODEX) OTIC suspension Place 4 drops into both ears 2 (two) times daily. 7.5 mL 0   cyanocobalamin 100 MCG tablet Take 100 mcg by mouth daily.     diclofenac (CATAFLAM) 50 MG tablet Take 50 mg by mouth 2 (two) times daily at 10 AM and 5 PM.     diclofenac sodium (VOLTAREN) 1 % GEL Apply 2 g topically 4 (four) times daily. (Patient not taking: Reported on 01/04/2019) 100 g 1   famotidine (PEPCID) 10 MG tablet Take 1 tablet (10 mg total) by mouth daily. 30 tablet 6   fluticasone (FLONASE) 50 MCG/ACT nasal spray Place 2 sprays into both nostrils daily. 16 g 6   furosemide (LASIX) 20 MG tablet TAKE 1 TABLET (20 MG TOTAL) BY MOUTH 2 TIMES DAILY. 60 tablet 3   gabapentin (NEURONTIN) 300 MG capsule TAKE 2 CAPSULES (600 MG TOTAL) BY MOUTH 3 TIMES A DAY 180 capsule 3   ibuprofen (ADVIL,MOTRIN) 200 MG tablet Take 600-800 mg by mouth every 8 (eight) hours as needed (for pain.).      Insulin Pen Needle (BD PEN NEEDLE NANO U/F) 32G X 4 MM MISC 1 Package by Does not apply route daily at 12 noon. 100 each 0   Liraglutide -Weight Management (SAXENDA) 18 MG/3ML SOPN Inject 3 mg into the skin daily. 5 pen 0   meloxicam (MOBIC) 15 MG tablet Take 1 tablet (15 mg total) by mouth daily. (Patient not taking: Reported on 01/04/2019) 30 tablet 0   pantoprazole (PROTONIX) 40 MG tablet Take 1 tablet (40 mg total) by mouth daily. Take 30 minutes prior to breakfast 30 tablet 3   Phenyleph-Doxylamine-DM-APAP (NYQUIL SEVERE COLD/FLU) 5-6.25-10-325 MG CAPS Take 1 tablet by mouth 2 (two) times daily. 112 capsule 0   polyethylene glycol powder (GLYCOLAX/MIRALAX) powder Take 17 g by mouth daily as needed. 3350 g 1   Vitamin D, Ergocalciferol, (DRISDOL) 1.25 MG (50000 UT) CAPS capsule Take 1 capsule (50,000 Units total) by mouth every 7 (seven) days. 4 capsule 0   No current facility-administered medications on file prior to visit.     PAST MEDICAL HISTORY: Past Medical History:  Diagnosis Date   Allergy     Anxiety    Arthritis    Asthma    since baby, seasonal   ASTHMA, UNSPECIFIED 12/10/2006   Qualifier: Diagnosis of  By: Abundio Miu     CELLULITIS AND ABSCESS OF LEG EXCEPT FOOT 12/30/2007   Qualifier: Diagnosis of  By: Daphine Deutscher FNP, [place]     Cyst of bone    right side of spine   DEPENDENT EDEMA, LEGS, BILATERAL 11/22/2008   Qualifier: Diagnosis of  By: Barbaraann Barthel MD, [place]     Depression 2003Dx   Edema    Food allergy    citris   FOOT PAIN, RIGHT 11/22/2008   Qualifier: Diagnosis of  By: Barbaraann Barthel MD, [place]     Gastric ulcer    GERD (gastroesophageal reflux disease)    Gout    Hypertension    took Norvasc for 3 months, doctor discontinued no longer on BP medications   Joint pain    KNEE  INJURY, LEFT 01/01/2009   Qualifier: Diagnosis of  By: Delrae AlfredMulberry MD, [place]     Migraine    ONYCHOMYCOSIS, TOENAILS 11/22/2008   Qualifier: Diagnosis of  By: Barbaraann Barthelankins MD, [place]     Shortness of breath    Sleep apnea    TINEA PEDIS 11/22/2008   Qualifier: Diagnosis of  By: Barbaraann Barthelankins MD, Benetta SparVictoria      PAST SURGICAL HISTORY: Past Surgical History:  Procedure Laterality Date   DILITATION & CURRETTAGE/HYSTROSCOPY WITH NOVASURE ABLATION N/A 07/03/2017   Procedure: DILATATION & CURETTAGE/HYSTEROSCOPY WITH NOVASURE ABLATION;  Surgeon: Genia DelLavoie, Marie-Lyne, MD;  Location: [facility];  Service: Gynecology;  Laterality: N/A;   UPPER GI ENDOSCOPY     WISDOM TOOTH EXTRACTION  2008   x4    SOCIAL HISTORY: Social History   Tobacco Use   Smoking status: Former Smoker    Packs/day: 0.50    Years: 18.00    Pack years: 9.00    Types: Cigarettes    Start date: 10/13/1996    Last attempt to quit: 01/12/2015    Years since quitting: 4.1   Smokeless tobacco: Never Used  Substance Use Topics   Alcohol use: Yes    Alcohol/week: 1.0 standard drinks    Types: 1 Standard drinks or equivalent per week    Comment: rarely drinks   Drug use: No    Comment: marjuana - former use,  quit 4 years ago    FAMILY HISTORY: Family History  Problem Relation Age of Onset   Hypertension Mother    Hyperlipidemia Mother    Cancer Mother        bone cancer   Heart disease Mother    Sudden death Mother    Stroke Mother    Thyroid disease Mother    Sleep apnea Mother    Obesity Mother    Sudden death Father    Hypertension Father    Heart disease Father    Hyperlipidemia Father    Prostate cancer Father    Lung cancer Father    Obesity Father    Alcoholism Father    Sudden death Brother    Hypertension Brother    Hyperlipidemia Brother    Heart attack Brother    Heart disease Brother    Stroke Brother    Alcoholism Brother    Drug abuse Brother    Heart disease Sister    Hypertension Sister    Thyroid cancer Sister    Diabetes Neg Hx    Colon cancer Neg Hx    Stomach cancer Neg Hx    Esophageal cancer Neg Hx    Rectal cancer Neg Hx    Liver cancer Neg Hx     ROS: Review of Systems  Gastrointestinal: Negative for nausea and vomiting.  Endo/Heme/Allergies:       Negative for hypoglycemia.    PHYSICAL EXAM: Pt in no acute distress  RECENT LABS AND TESTS: BMET    Component Value Date/Time   NA 140 09/27/2018 1602   K 4.7 09/27/2018 1602   CL 100 09/27/2018 1602   CO2 24 09/27/2018 1602   GLUCOSE 73 09/27/2018 1602   GLUCOSE 83 06/04/2016 1155   BUN 16 09/27/2018 1602   CREATININE 1.18 (H) 09/27/2018 1602   CREATININE 0.90 06/04/2016 1155   CALCIUM 9.2 09/27/2018 1602   GFRNONAA 59 (L) 09/27/2018 1602   GFRNONAA 84 06/04/2016 1155   GFRAA 69 09/27/2018 1602   GFRAA >89 06/04/2016 1155   Lab Results  Component Value  Date   HGBA1C 5.5 09/28/2018   HGBA1C 5.4 05/24/2018   HGBA1C 5.3 12/10/2017   HGBA1C 5.4 06/25/2017   HGBA1C 5.2 05/19/2017   Lab Results  Component Value Date   INSULIN 29.7 (H) 09/28/2018   INSULIN 28.3 (H) 05/24/2018   INSULIN 43.6 (H) 12/10/2017   INSULIN 30.4 (H) 06/25/2017    INSULIN 29.7 (H) 01/20/2017   CBC    Component Value Date/Time   WBC 10.6 12/10/2017 1242   WBC 13.3 (H) 07/03/2017 0610   RBC 4.64 12/10/2017 1242   RBC 4.33 07/03/2017 0610   HGB 12.5 12/10/2017 1242   HCT 38.4 12/10/2017 1242   PLT 293 07/03/2017 0610   PLT 343 05/18/2017 1611   MCV 83 12/10/2017 1242   MCH 26.9 12/10/2017 1242   MCH 25.9 (L) 07/03/2017 0610   MCHC 32.6 12/10/2017 1242   MCHC 32.4 07/03/2017 0610   RDW 16.2 (H) 12/10/2017 1242   LYMPHSABS 5.2 (H) 12/10/2017 1242   MONOABS 0.6 07/03/2017 0610   EOSABS 0.0 12/10/2017 1242   BASOSABS 0.0 12/10/2017 1242   Iron/TIBC/Ferritin/ %Sat    Component Value Date/Time   IRON 25 (L) 05/18/2017 1611   TIBC 363 05/18/2017 1611   FERRITIN 16 05/18/2017 1611   IRONPCTSAT 7 (LL) 05/18/2017 1611   Lipid Panel     Component Value Date/Time   CHOL 146 05/24/2018 1044   TRIG 53 05/24/2018 1044   HDL 41 05/24/2018 1044   CHOLHDL 3.6 11/21/2015 1047   VLDL 10 11/21/2015 1047   LDLCALC 94 05/24/2018 1044   Hepatic Function Panel     Component Value Date/Time   PROT 7.3 05/24/2018 1044   ALBUMIN 4.1 05/24/2018 1044   AST 29 05/24/2018 1044   ALT 24 05/24/2018 1044   ALKPHOS 70 05/24/2018 1044   BILITOT 0.3 05/24/2018 1044      Component Value Date/Time   TSH 1.43 05/19/2017 1245   TSH 1.460 10/27/2016 1058   TSH 0.96 11/21/2015 1047   Results for MY, RINKE (MRN [id number]) as of 03/17/2019 09:25  Ref. Range 09/28/2018 09:09  Vitamin D, 25-Hydroxy Latest Ref Range: 30.0 - 100.0 ng/mL 25.6 (L)     I, Kirke Corin, CMA, am acting as transcriptionist for Wilder Glade, MD I have reviewed the above documentation for accuracy and completeness, and I agree with the above. -Quillian Quince, MD

## 2019-03-27 MED FILL — FUROSEMIDE 20 MG TABS: 20 | 30 days supply | Qty: 60 | Fill #1

## 2019-03-31 ENCOUNTER — Encounter (INDEPENDENT_AMBULATORY_CARE_PROVIDER_SITE_OTHER): Payer: Self-pay | Admitting: Family Medicine

## 2019-03-31 ENCOUNTER — Other Ambulatory Visit: Payer: Self-pay

## 2019-03-31 ENCOUNTER — Ambulatory Visit (INDEPENDENT_AMBULATORY_CARE_PROVIDER_SITE_OTHER): Payer: 59 | Admitting: Family Medicine

## 2019-03-31 DIAGNOSIS — E559 Vitamin D deficiency, unspecified: Secondary | ICD-10-CM

## 2019-03-31 DIAGNOSIS — Z6841 Body Mass Index (BMI) 40.0 and over, adult: Secondary | ICD-10-CM

## 2019-03-31 MED ORDER — VITAMIN D (ERGOCALCIFEROL) 1.25 MG (50000 UNIT) PO CAPS: 50000.0000 [IU] | ORAL_CAPSULE | ORAL | 0 refills | Status: DC

## 2019-03-31 MED ORDER — SAXENDA 18 MG/3ML ~~LOC~~ SOPN: 3.0000 mg | PEN_INJECTOR | Freq: Every day | SUBCUTANEOUS | 0 refills | Status: DC

## 2019-03-31 MED FILL — SAXENDA 18 MG/3 ML PEN: 18 | 30 days supply | Qty: 15 | Fill #0

## 2019-03-31 MED FILL — VIT D2 1.25 MG (50,000 UNIT: 1.25 MG | 28 days supply | Qty: 4 | Fill #0

## 2019-04-04 NOTE — Progress Notes (Signed)
Office: [phone]  /  Fax: [phone] TeleHealth Visit:  Catherine [name] has verbally consented to this TeleHealth visit today. The patient is located at home, the provider is located at the [facility] office. The participants in this visit include the listed provider and patient. The visit was conducted today via webex.  HPI:   Chief Complaint: OBESITY Catherine [name] is here to discuss her progress with her obesity treatment plan. She is on the Category 4 plan or follow our protein rich vegetarian plan and is following her eating plan approximately 50 % of the time. She states she is exercising 0 minutes 0 times per week. Catherine [name] feels she has maintained her weight since her last visit. She has been struggling more with her eating plans, especially during COVID-19 isolation. She is on Saxenda 3.0 mg and she notes decreased polyphagia.  We were unable to weigh the patient today for this TeleHealth visit. She feels as if she has maintained her weight since her last visit. She has lost 53 lbs since starting treatment with [name].  Vitamin D Deficiency Catherine [name] has a diagnosis of vitamin D deficiency. She is stable on prescription Vit D, but level is not yet at goal. She denies nausea, vomiting or muscle weakness.  ASSESSMENT AND PLAN:  Vitamin D deficiency - Plan: Vitamin D, Ergocalciferol, (DRISDOL) 1.25 MG (50000 UT) CAPS capsule  Class 3 severe obesity with serious comorbidity and body mass index (BMI) of 60.0 to 69.9 in adult, unspecified obesity type ([place]) - Plan: Liraglutide -Weight Management (SAXENDA) 18 MG/3ML SOPN  PLAN:  Vitamin D Deficiency Catherine [name] was informed that low vitamin D levels contributes to fatigue and are associated with obesity, breast, and colon cancer. Catherine [name] agrees to continue taking prescription Vit D 50,000 IU every week #4 and we will refill for 1 month. She will follow up for routine testing of vitamin D, at least 2-3 times per year. She was  informed of the risk of over-replacement of vitamin D and agrees to not increase her dose unless she discusses this with [name] first. Catherine [name] agrees to follow up with our clinic in 3 weeks.  Obesity Catherine [name] is currently in the action stage of change. As such, her goal is to continue with weight loss efforts She has agreed to follow our protein rich vegetarian plan Catherine [name] has been instructed to work up to a goal of 150 minutes of combined cardio and strengthening exercise per week for weight loss and overall health benefits. We discussed the following Behavioral Modification Strategies today: increasing lean protein intake and increasing vegetables We discussed various medication options to help Catherine [name] with her weight loss efforts and we both agreed to continue Saxenda 3.0 mg SubQ daily #5 pens and we will refill for 1 month.   Catherine [name] has agreed to follow up with our clinic in 3 weeks. She was informed of the importance of frequent follow up visits to maximize her success with intensive lifestyle modifications for her multiple health conditions.  ALLERGIES: Allergies  Allergen Reactions  . Citrus Swelling    MEDICATIONS: Current Outpatient Medications on File Prior to Visit  Medication Sig Dispense Refill  . aspirin-acetaminophen-caffeine (EXCEDRIN MIGRAINE) 250-250-65 MG per tablet Take 2 tablets by mouth 2 (two) times daily as needed for migraine. For headache     . budesonide (PULMICORT) 180 MCG/ACT inhaler Inhale 1 puff into the lungs 2 (two) times daily. (Patient not taking: Reported on 01/04/2019) 1 Inhaler 1  . cetirizine (ZYRTEC) 10 MG  tablet Take 10 mg by mouth daily.    . ciprofloxacin-dexamethasone (CIPRODEX) OTIC suspension Place 4 drops into both ears 2 (two) times daily. 7.5 mL 0  . cyanocobalamin 100 MCG tablet Take 100 mcg by mouth daily.    . diclofenac (CATAFLAM) 50 MG tablet Take 50 mg by mouth 2 (two) times daily at 10 AM and 5 PM.    . diclofenac sodium (VOLTAREN) 1 %  GEL Apply 2 g topically 4 (four) times daily. (Patient not taking: Reported on 01/04/2019) 100 g 1  . famotidine (PEPCID) 10 MG tablet Take 1 tablet (10 mg total) by mouth daily. 30 tablet 6  . fluticasone (FLONASE) 50 MCG/ACT nasal spray Place 2 sprays into both nostrils daily. 16 g 6  . furosemide (LASIX) 20 MG tablet TAKE 1 TABLET (20 MG TOTAL) BY MOUTH 2 TIMES DAILY. 60 tablet 3  . gabapentin (NEURONTIN) 300 MG capsule TAKE 2 CAPSULES (600 MG TOTAL) BY MOUTH 3 TIMES A DAY 180 capsule 3  . ibuprofen (ADVIL,MOTRIN) 200 MG tablet Take 600-800 mg by mouth every 8 (eight) hours as needed (for pain.).     . Insulin Pen Needle (BD PEN NEEDLE NANO U/F) 32G X 4 MM MISC 1 Package by Does not apply route daily at 12 noon. 100 each 0  . meloxicam (MOBIC) 15 MG tablet Take 1 tablet (15 mg total) by mouth daily. (Patient not taking: Reported on 01/04/2019) 30 tablet 0  . pantoprazole (PROTONIX) 40 MG tablet Take 1 tablet (40 mg total) by mouth daily. Take 30 minutes prior to breakfast 30 tablet 3  . Phenyleph-Doxylamine-DM-APAP (NYQUIL SEVERE COLD/FLU) 5-6.25-10-325 MG CAPS Take 1 tablet by mouth 2 (two) times daily. 112 capsule 0  . polyethylene glycol powder (GLYCOLAX/MIRALAX) powder Take 17 g by mouth daily as needed. 3350 g 1   No current facility-administered medications on file prior to visit.     PAST MEDICAL HISTORY: Past Medical History:  Diagnosis Date  . Allergy   . Anxiety   . Arthritis   . Asthma    since baby, seasonal  . ASTHMA, UNSPECIFIED 12/10/2006   Qualifier: Diagnosis of  By: Catherine [name], Catherine [name]    . CELLULITIS AND ABSCESS OF LEG EXCEPT FOOT 12/30/2007   Qualifier: Diagnosis of  By: Catherine DeutscherMartin FNP, Zena [name]    . Cyst of bone    right side of spine  . DEPENDENT EDEMA, LEGS, BILATERAL 11/22/2008   Qualifier: Diagnosis of  By: Catherine Barthelankins MD, Catherine [name]    . Depression 2003Dx  . Edema   . Food allergy    citris  . FOOT PAIN, RIGHT 11/22/2008   Qualifier: Diagnosis of  By: Catherine Barthelankins MD,  Catherine [name]    . Gastric ulcer   . GERD (gastroesophageal reflux disease)   . Gout   . Hypertension    took Norvasc for 3 months, doctor discontinued no longer on BP medications  . Joint pain   . KNEE INJURY, LEFT 01/01/2009   Qualifier: Diagnosis of  By: Delrae AlfredMulberry MD, Lanora ManisElizabeth    . Migraine   . ONYCHOMYCOSIS, TOENAILS 11/22/2008   Qualifier: Diagnosis of  By: Catherine Barthelankins MD, Catherine [name]    . Shortness of breath   . Sleep apnea   . TINEA PEDIS 11/22/2008   Qualifier: Diagnosis of  By: Catherine Barthelankins MD, Benetta SparVictoria      PAST SURGICAL HISTORY: Past Surgical History:  Procedure Laterality Date  . DILITATION & CURRETTAGE/HYSTROSCOPY WITH NOVASURE ABLATION N/A 07/03/2017   Procedure: DILATATION & CURETTAGE/HYSTEROSCOPY WITH NOVASURE ABLATION;  Surgeon: Genia DelLavoie, Marie-Lyne, MD;  Location: [facility];  Service: Gynecology;  Laterality: N/A;  . UPPER GI ENDOSCOPY    . WISDOM TOOTH EXTRACTION  2008   x4    SOCIAL HISTORY: Social History   Tobacco Use  . Smoking status: Former Smoker    Packs/day: 0.50    Years: 18.00    Pack years: 9.00    Types: Cigarettes    Start date: 10/13/1996    Quit date: 01/12/2015    Years since quitting: 4.2  . Smokeless tobacco: Never Used  Substance Use Topics  . Alcohol use: Yes    Alcohol/week: 1.0 standard drinks    Types: 1 Standard drinks or equivalent per week    Comment: rarely drinks  . Drug use: No    Comment: marjuana - former use, quit 4 years ago    FAMILY HISTORY: Family History  Problem Relation Age of Onset  . Hypertension Mother   . Hyperlipidemia Mother   . Cancer Mother        bone cancer  . Heart disease Mother   . Sudden death Mother   . Stroke Mother   . Thyroid disease Mother   . Sleep apnea Mother   . Obesity Mother   . Sudden death Father   . Hypertension Father   . Heart disease Father   . Hyperlipidemia Father   . Prostate cancer Father   . Lung cancer Father   . Obesity Father   . Alcoholism Father   . Sudden death Brother   .  Hypertension Brother   . Hyperlipidemia Brother   . Heart attack Brother   . Heart disease Brother   . Stroke Brother   . Alcoholism Brother   . Drug abuse Brother   . Heart disease Sister   . Hypertension Sister   . Thyroid cancer Sister   . Diabetes Neg Hx   . Colon cancer Neg Hx   . Stomach cancer Neg Hx   . Esophageal cancer Neg Hx   . Rectal cancer Neg Hx   . Liver cancer Neg Hx     ROS: Review of Systems  Constitutional: Negative for weight loss.  Gastrointestinal: Negative for nausea and vomiting.  Musculoskeletal:       Negative muscle weakness  Endo/Heme/Allergies:       Positive polyphagia    PHYSICAL EXAM: Pt in no acute distress  RECENT LABS AND TESTS: BMET    Component Value Date/Time   NA 140 09/27/2018 1602   K 4.7 09/27/2018 1602   CL 100 09/27/2018 1602   CO2 24 09/27/2018 1602   GLUCOSE 73 09/27/2018 1602   GLUCOSE 83 06/04/2016 1155   BUN 16 09/27/2018 1602   CREATININE 1.18 (H) 09/27/2018 1602   CREATININE 0.90 06/04/2016 1155   CALCIUM 9.2 09/27/2018 1602   GFRNONAA 59 (L) 09/27/2018 1602   GFRNONAA 84 06/04/2016 1155   GFRAA 69 09/27/2018 1602   GFRAA >89 06/04/2016 1155   Lab Results  Component Value Date   HGBA1C 5.5 09/28/2018   HGBA1C 5.4 05/24/2018   HGBA1C 5.3 12/10/2017   HGBA1C 5.4 06/25/2017   HGBA1C 5.2 05/19/2017   Lab Results  Component Value Date   INSULIN 29.7 (H) 09/28/2018   INSULIN 28.3 (H) 05/24/2018   INSULIN 43.6 (H) 12/10/2017   INSULIN 30.4 (H) 06/25/2017   INSULIN 29.7 (H) 01/20/2017   CBC    Component Value Date/Time   WBC 10.6 12/10/2017 1242   WBC 13.3 (  H) 07/03/2017 0610   RBC 4.64 12/10/2017 1242   RBC 4.33 07/03/2017 0610   HGB 12.5 12/10/2017 1242   HCT 38.4 12/10/2017 1242   PLT 293 07/03/2017 0610   PLT 343 05/18/2017 1611   MCV 83 12/10/2017 1242   MCH 26.9 12/10/2017 1242   MCH 25.9 (L) 07/03/2017 0610   MCHC 32.6 12/10/2017 1242   MCHC 32.4 07/03/2017 0610   RDW 16.2 (H)  12/10/2017 1242   LYMPHSABS 5.2 (H) 12/10/2017 1242   MONOABS 0.6 07/03/2017 0610   EOSABS 0.0 12/10/2017 1242   BASOSABS 0.0 12/10/2017 1242   Iron/TIBC/Ferritin/ %Sat    Component Value Date/Time   IRON 25 (L) 05/18/2017 1611   TIBC 363 05/18/2017 1611   FERRITIN 16 05/18/2017 1611   IRONPCTSAT 7 (LL) 05/18/2017 1611   Lipid Panel     Component Value Date/Time   CHOL 146 05/24/2018 1044   TRIG 53 05/24/2018 1044   HDL 41 05/24/2018 1044   CHOLHDL 3.6 11/21/2015 1047   VLDL 10 11/21/2015 1047   LDLCALC 94 05/24/2018 1044   Hepatic Function Panel     Component Value Date/Time   PROT 7.3 05/24/2018 1044   ALBUMIN 4.1 05/24/2018 1044   AST 29 05/24/2018 1044   ALT 24 05/24/2018 1044   ALKPHOS 70 05/24/2018 1044   BILITOT 0.3 05/24/2018 1044      Component Value Date/Time   TSH 1.43 05/19/2017 1245   TSH 1.460 10/27/2016 1058   TSH 0.96 11/21/2015 1047      I, Burt KnackSharon Catherine, am acting as Energy managertranscriptionist for Quillian Quincearen Sumer [place], MD I have reviewed the above documentation for accuracy and completeness, and I agree with the above. -Quillian Quincearen Alaisa Moffitt, MD

## 2019-04-06 ENCOUNTER — Other Ambulatory Visit: Payer: Self-pay

## 2019-04-06 ENCOUNTER — Ambulatory Visit (INDEPENDENT_AMBULATORY_CARE_PROVIDER_SITE_OTHER): Payer: 59

## 2019-04-06 DIAGNOSIS — Z111 Encounter for screening for respiratory tuberculosis: Secondary | ICD-10-CM | POA: Diagnosis not present

## 2019-04-06 NOTE — Progress Notes (Signed)
Patient presents in nurse clinic for PPD. PPD placed in left ventral forearm without complication. Patient to return to nurse clinic on 6/26 to have site read. Reminder card given.

## 2019-04-08 ENCOUNTER — Ambulatory Visit (INDEPENDENT_AMBULATORY_CARE_PROVIDER_SITE_OTHER): Payer: 59 | Admitting: *Deleted

## 2019-04-08 ENCOUNTER — Other Ambulatory Visit: Payer: Self-pay

## 2019-04-08 DIAGNOSIS — Z111 Encounter for screening for respiratory tuberculosis: Secondary | ICD-10-CM

## 2019-04-08 LAB — TB SKIN TEST
Induration: 0 mm
TB Skin Test: NEGATIVE

## 2019-04-08 NOTE — Progress Notes (Signed)
Patient is here for a PPD read.  It was placed on 04/06/19 in the left forearm @ 2:40 pm.    PPD RESULTS:  Result: negative Induration: 0 mm  Letter created and given to patient for documentation purposes. Christen Bame, CMA

## 2019-04-19 ENCOUNTER — Ambulatory Visit (INDEPENDENT_AMBULATORY_CARE_PROVIDER_SITE_OTHER): Payer: 59 | Admitting: Family Medicine

## 2019-05-12 MED FILL — PANTOPRAZOLE SOD DR 40 MG T: 40 | 30 days supply | Qty: 30 | Fill #1

## 2019-05-12 MED FILL — FUROSEMIDE 20 MG TABS: 20 | 30 days supply | Qty: 60 | Fill #2

## 2019-06-21 ENCOUNTER — Other Ambulatory Visit: Payer: Self-pay

## 2019-06-21 ENCOUNTER — Telehealth (INDEPENDENT_AMBULATORY_CARE_PROVIDER_SITE_OTHER): Payer: 59 | Admitting: Family Medicine

## 2019-06-21 ENCOUNTER — Encounter (INDEPENDENT_AMBULATORY_CARE_PROVIDER_SITE_OTHER): Payer: Self-pay | Admitting: Family Medicine

## 2019-06-21 DIAGNOSIS — E559 Vitamin D deficiency, unspecified: Secondary | ICD-10-CM | POA: Diagnosis not present

## 2019-06-21 DIAGNOSIS — E8881 Metabolic syndrome: Secondary | ICD-10-CM

## 2019-06-21 DIAGNOSIS — E88819 Insulin resistance, unspecified: Secondary | ICD-10-CM

## 2019-06-21 DIAGNOSIS — Z6841 Body Mass Index (BMI) 40.0 and over, adult: Secondary | ICD-10-CM | POA: Diagnosis not present

## 2019-06-21 MED ORDER — VITAMIN D (ERGOCALCIFEROL) 1.25 MG (50000 UNIT) PO CAPS: 50000.0000 [IU] | ORAL_CAPSULE | ORAL | 0 refills | Status: DC

## 2019-06-21 MED FILL — VIT D2 1.25 MG (50,000 UNIT: 1.25 MG | 28 days supply | Qty: 4 | Fill #0

## 2019-06-22 NOTE — Progress Notes (Signed)
Office: [phone]  /  Fax: [phone] TeleHealth Visit:  Catherine [name] has verbally consented to this TeleHealth visit today. The patient is located at home, the provider is located at the [facility] office. The participants in this visit include the listed provider and patient and any and all parties involved. The visit was conducted today via WebEx.  HPI:   Chief Complaint: OBESITY Lonnetta is here to discuss her progress with her obesity treatment plan. She is on the Category 4 plan and is following her eating plan approximately 75 % of the time. She states she is exercising 0 minutes 0 times per week. Tiziana's last virtual office visit was 03/31/19. She reports that she has gained 5 to 10 pounds since that time. She lost her job in June and she did not feel it was a good time to work on weight loss. She has been off the plan at times. She had been on the vegetarian plan,but she is now back on the Category 4 plan. Sarie started back on Saxenda three weeks ago. She is taking 3 mg daily which is helping with appetite suppression. We were unable to weigh the patient today for this TeleHealth visit. She feels as if she has gained weight (weight not reported) since her last visit. She has lost 53 lbs since starting treatment with [name].  Vitamin D deficiency Vieva has a diagnosis of vitamin D deficiency. She is currently taking vit D. Her last vitamin D level was at 25.6 on 09/28/18 and was not at goal. Aashvi denies nausea, vomiting or muscle weakness.  Insulin Resistance Amarea has a diagnosis of insulin resistance based on her elevated fasting insulin level >5. Although Ife's blood glucose readings are still under good control, insulin resistance puts her at greater risk of metabolic syndrome and diabetes. She is not on metformin. She had been in prediabetic range previously.  She is taking Saxenda to decrease her appetite. She continues to work on diet and exercise  to decrease risk of diabetes. Emercyn denies polyphagia. Lab Results  Component Value Date   HGBA1C 5.5 09/28/2018    ASSESSMENT AND PLAN:  Vitamin D deficiency - Plan: Vitamin D, Ergocalciferol, (DRISDOL) 1.25 MG (50000 UT) CAPS capsule  Class 3 severe obesity with serious comorbidity and body mass index (BMI) of 60.0 to 69.9 in adult, unspecified obesity type (HCC)  Insulin resistance  PLAN:  Vitamin D Deficiency Kyesha was informed that low vitamin D levels contributes to fatigue and are associated with obesity, breast, and colon cancer. Phung agrees to continue to take prescription Vit D @50 ,000 IU every week #4 with no refills and she will follow up for routine testing of vitamin D, at least 2-3 times per year. She was informed of the risk of over-replacement of vitamin D and agrees to not increase her dose unless she discusses this with [name] first. Dequana agrees to follow up with our clinic in 2 weeks.  Insulin Resistance Inetta will continue to work on weight loss, exercise, and decreasing simple carbohydrates in her diet to help decrease the risk of diabetes.  We will check A1c and fasting insulin in 1 month and Juneau agreed to follow up with [name] as directed to monitor her progress.  Obesity Ahliyah is currently in the action stage of change. As such, her goal is to continue with weight loss efforts She has agreed to follow the Category 4 plan Mayanna has not been prescribed exercise at this time. We discussed  the following Behavioral Modification Strategies today: planning for success, no skipping meals, increasing lean protein intake and work on meal planning and easy cooking plans  We both agreed to continue Saxenda 3 mg daily subQ.  Sharissa has agreed to follow up with our clinic in 2 weeks. She was informed of the importance of frequent follow up visits to maximize her success with intensive lifestyle modifications for her multiple health conditions.  ALLERGIES:  Allergies  Allergen Reactions  . Citrus Swelling    MEDICATIONS: Current Outpatient Medications on File Prior to Visit  Medication Sig Dispense Refill  . aspirin-acetaminophen-caffeine (EXCEDRIN MIGRAINE) 250-250-65 MG per tablet Take 2 tablets by mouth 2 (two) times daily as needed for migraine. For headache     . budesonide (PULMICORT) 180 MCG/ACT inhaler Inhale 1 puff into the lungs 2 (two) times daily. (Patient not taking: Reported on 01/04/2019) 1 Inhaler 1  . cetirizine (ZYRTEC) 10 MG tablet Take 10 mg by mouth daily.    . ciprofloxacin-dexamethasone (CIPRODEX) OTIC suspension Place 4 drops into both ears 2 (two) times daily. 7.5 mL 0  . cyanocobalamin 100 MCG tablet Take 100 mcg by mouth daily.    . diclofenac (CATAFLAM) 50 MG tablet Take 50 mg by mouth 2 (two) times daily at 10 AM and 5 PM.    . diclofenac sodium (VOLTAREN) 1 % GEL Apply 2 g topically 4 (four) times daily. (Patient not taking: Reported on 01/04/2019) 100 g 1  . famotidine (PEPCID) 10 MG tablet Take 1 tablet (10 mg total) by mouth daily. 30 tablet 6  . fluticasone (FLONASE) 50 MCG/ACT nasal spray Place 2 sprays into both nostrils daily. 16 g 6  . furosemide (LASIX) 20 MG tablet TAKE 1 TABLET (20 MG TOTAL) BY MOUTH 2 TIMES DAILY. 60 tablet 3  . gabapentin (NEURONTIN) 300 MG capsule TAKE 2 CAPSULES (600 MG TOTAL) BY MOUTH 3 TIMES A DAY 180 capsule 3  . ibuprofen (ADVIL,MOTRIN) 200 MG tablet Take 600-800 mg by mouth every 8 (eight) hours as needed (for pain.).     . Insulin Pen Needle (BD PEN NEEDLE NANO U/F) 32G X 4 MM MISC 1 Package by Does not apply route daily at 12 noon. 100 each 0  . Liraglutide -Weight Management (SAXENDA) 18 MG/3ML SOPN Inject 3 mg into the skin daily. 5 pen 0  . meloxicam (MOBIC) 15 MG tablet Take 1 tablet (15 mg total) by mouth daily. (Patient not taking: Reported on 01/04/2019) 30 tablet 0  . pantoprazole (PROTONIX) 40 MG tablet Take 1 tablet (40 mg total) by mouth daily. Take 30 minutes prior to  breakfast 30 tablet 3  . Phenyleph-Doxylamine-DM-APAP (NYQUIL SEVERE COLD/FLU) 5-6.25-10-325 MG CAPS Take 1 tablet by mouth 2 (two) times daily. 112 capsule 0  . polyethylene glycol powder (GLYCOLAX/MIRALAX) powder Take 17 g by mouth daily as needed. 3350 g 1   No current facility-administered medications on file prior to visit.     PAST MEDICAL HISTORY: Past Medical History:  Diagnosis Date  . Allergy   . Anxiety   . Arthritis   . Asthma    since baby, seasonal  . ASTHMA, UNSPECIFIED 12/10/2006   Qualifier: Diagnosis of  By: Eusebio Friendly    . CELLULITIS AND ABSCESS OF LEG EXCEPT FOOT 12/30/2007   Qualifier: Diagnosis of  By: Hassell Done FNP, Tori Milks    . Cyst of bone    right side of spine  . DEPENDENT EDEMA, LEGS, BILATERAL 11/22/2008   Qualifier: Diagnosis of  ByBarbaraann Barthel: Rankins MD, [place]    . Depression 2003Dx  . Edema   . Food allergy    citris  . FOOT PAIN, RIGHT 11/22/2008   Qualifier: Diagnosis of  By: Barbaraann Barthelankins MD, [place]    . Gastric ulcer   . GERD (gastroesophageal reflux disease)   . Gout   . Hypertension    took Norvasc for 3 months, doctor discontinued no longer on BP medications  . Joint pain   . KNEE INJURY, LEFT 01/01/2009   Qualifier: Diagnosis of  By: Delrae AlfredMulberry MD, Lanora ManisElizabeth    . Migraine   . ONYCHOMYCOSIS, TOENAILS 11/22/2008   Qualifier: Diagnosis of  By: Barbaraann Barthelankins MD, [place]    . Shortness of breath   . Sleep apnea   . TINEA PEDIS 11/22/2008   Qualifier: Diagnosis of  By: Barbaraann Barthelankins MD, Benetta SparVictoria      PAST SURGICAL HISTORY: Past Surgical History:  Procedure Laterality Date  . DILITATION & CURRETTAGE/HYSTROSCOPY WITH NOVASURE ABLATION N/A 07/03/2017   Procedure: DILATATION & CURETTAGE/HYSTEROSCOPY WITH NOVASURE ABLATION;  Surgeon: Genia DelLavoie, Marie-Lyne, MD;  Location: [facility];  Service: Gynecology;  Laterality: N/A;  . UPPER GI ENDOSCOPY    . WISDOM TOOTH EXTRACTION  2008   x4    SOCIAL HISTORY: Social History   Tobacco Use  . Smoking status: Former Smoker     Packs/day: 0.50    Years: 18.00    Pack years: 9.00    Types: Cigarettes    Start date: 10/13/1996    Quit date: 01/12/2015    Years since quitting: 4.4  . Smokeless tobacco: Never Used  Substance Use Topics  . Alcohol use: Yes    Alcohol/week: 1.0 standard drinks    Types: 1 Standard drinks or equivalent per week    Comment: rarely drinks  . Drug use: No    Comment: marjuana - former use, quit 4 years ago    FAMILY HISTORY: Family History  Problem Relation Age of Onset  . Hypertension Mother   . Hyperlipidemia Mother   . Cancer Mother        bone cancer  . Heart disease Mother   . Sudden death Mother   . Stroke Mother   . Thyroid disease Mother   . Sleep apnea Mother   . Obesity Mother   . Sudden death Father   . Hypertension Father   . Heart disease Father   . Hyperlipidemia Father   . Prostate cancer Father   . Lung cancer Father   . Obesity Father   . Alcoholism Father   . Sudden death Brother   . Hypertension Brother   . Hyperlipidemia Brother   . Heart attack Brother   . Heart disease Brother   . Stroke Brother   . Alcoholism Brother   . Drug abuse Brother   . Heart disease Sister   . Hypertension Sister   . Thyroid cancer Sister   . Diabetes Neg Hx   . Colon cancer Neg Hx   . Stomach cancer Neg Hx   . Esophageal cancer Neg Hx   . Rectal cancer Neg Hx   . Liver cancer Neg Hx     ROS: Review of Systems  Constitutional: Negative for weight loss.  Gastrointestinal: Negative for nausea and vomiting.  Musculoskeletal:       Negative for muscle weakness  Endo/Heme/Allergies:       Negative for polyphagia    PHYSICAL EXAM: Pt in no acute distress  RECENT LABS AND TESTS: BMET  Component Value Date/Time   NA 140 09/27/2018 1602   K 4.7 09/27/2018 1602   CL 100 09/27/2018 1602   CO2 24 09/27/2018 1602   GLUCOSE 73 09/27/2018 1602   GLUCOSE 83 06/04/2016 1155   BUN 16 09/27/2018 1602   CREATININE 1.18 (H) 09/27/2018 1602   CREATININE 0.90  06/04/2016 1155   CALCIUM 9.2 09/27/2018 1602   GFRNONAA 59 (L) 09/27/2018 1602   GFRNONAA 84 06/04/2016 1155   GFRAA 69 09/27/2018 1602   GFRAA >89 06/04/2016 1155   Lab Results  Component Value Date   HGBA1C 5.5 09/28/2018   HGBA1C 5.4 05/24/2018   HGBA1C 5.3 12/10/2017   HGBA1C 5.4 06/25/2017   HGBA1C 5.2 05/19/2017   Lab Results  Component Value Date   INSULIN 29.7 (H) 09/28/2018   INSULIN 28.3 (H) 05/24/2018   INSULIN 43.6 (H) 12/10/2017   INSULIN 30.4 (H) 06/25/2017   INSULIN 29.7 (H) 01/20/2017   CBC    Component Value Date/Time   WBC 10.6 12/10/2017 1242   WBC 13.3 (H) 07/03/2017 0610   RBC 4.64 12/10/2017 1242   RBC 4.33 07/03/2017 0610   HGB 12.5 12/10/2017 1242   HCT 38.4 12/10/2017 1242   PLT 293 07/03/2017 0610   PLT 343 05/18/2017 1611   MCV 83 12/10/2017 1242   MCH 26.9 12/10/2017 1242   MCH 25.9 (L) 07/03/2017 0610   MCHC 32.6 12/10/2017 1242   MCHC 32.4 07/03/2017 0610   RDW 16.2 (H) 12/10/2017 1242   LYMPHSABS 5.2 (H) 12/10/2017 1242   MONOABS 0.6 07/03/2017 0610   EOSABS 0.0 12/10/2017 1242   BASOSABS 0.0 12/10/2017 1242   Iron/TIBC/Ferritin/ %Sat    Component Value Date/Time   IRON 25 (L) 05/18/2017 1611   TIBC 363 05/18/2017 1611   FERRITIN 16 05/18/2017 1611   IRONPCTSAT 7 (LL) 05/18/2017 1611   Lipid Panel     Component Value Date/Time   CHOL 146 05/24/2018 1044   TRIG 53 05/24/2018 1044   HDL 41 05/24/2018 1044   CHOLHDL 3.6 11/21/2015 1047   VLDL 10 11/21/2015 1047   LDLCALC 94 05/24/2018 1044   Hepatic Function Panel     Component Value Date/Time   PROT 7.3 05/24/2018 1044   ALBUMIN 4.1 05/24/2018 1044   AST 29 05/24/2018 1044   ALT 24 05/24/2018 1044   ALKPHOS 70 05/24/2018 1044   BILITOT 0.3 05/24/2018 1044      Component Value Date/Time   TSH 1.43 05/19/2017 1245   TSH 1.460 10/27/2016 1058   TSH 0.96 11/21/2015 1047     Ref. Range 09/28/2018 09:09  Vitamin D, 25-Hydroxy Latest Ref Range: 30.0 - 100.0 ng/mL  25.6 (L)    I, [place] CraneJoanne Murray, am acting as Energy managertranscriptionist for [place] , FNP-C  I have reviewed the above documentation for accuracy and completeness, and I agree with the above.  -  , FNP-C.

## 2019-06-29 ENCOUNTER — Other Ambulatory Visit: Payer: Self-pay

## 2019-06-29 ENCOUNTER — Other Ambulatory Visit: Payer: Self-pay | Admitting: Nurse Practitioner

## 2019-06-29 MED ORDER — PANTOPRAZOLE SODIUM 40 MG PO TBEC: 40.0000 mg | DELAYED_RELEASE_TABLET | Freq: Every day | ORAL | 3 refills | Status: DC

## 2019-06-29 MED FILL — PANTOPRAZOLE SOD DR 40 MG T: 40 | 30 days supply | Qty: 30 | Fill #0

## 2019-07-02 MED FILL — VIT D2 1.25 MG (50,000 UNIT: 1.25 MG | 28 days supply | Qty: 4 | Fill #0

## 2019-07-04 ENCOUNTER — Telehealth: Payer: Self-pay | Admitting: Family Medicine

## 2019-07-04 NOTE — Telephone Encounter (Signed)
Pt filled out Disability Parking Placard form. Placed form in [employer].

## 2019-07-05 NOTE — Telephone Encounter (Signed)
Reviewed form and placed in PCP's box for completion.  .Ameriah Lint R Yahsir Wickens, CMA  

## 2019-07-06 ENCOUNTER — Encounter (INDEPENDENT_AMBULATORY_CARE_PROVIDER_SITE_OTHER): Payer: Self-pay | Admitting: Family Medicine

## 2019-07-06 ENCOUNTER — Telehealth (INDEPENDENT_AMBULATORY_CARE_PROVIDER_SITE_OTHER): Payer: 59 | Admitting: Family Medicine

## 2019-07-06 ENCOUNTER — Encounter (INDEPENDENT_AMBULATORY_CARE_PROVIDER_SITE_OTHER): Payer: Self-pay

## 2019-07-06 ENCOUNTER — Other Ambulatory Visit: Payer: Self-pay

## 2019-07-06 DIAGNOSIS — E8881 Metabolic syndrome: Secondary | ICD-10-CM | POA: Diagnosis not present

## 2019-07-06 DIAGNOSIS — Z6841 Body Mass Index (BMI) 40.0 and over, adult: Secondary | ICD-10-CM

## 2019-07-06 MED ORDER — SAXENDA 18 MG/3ML ~~LOC~~ SOPN: 3.0000 mg | PEN_INJECTOR | Freq: Every day | SUBCUTANEOUS | 0 refills | Status: DC

## 2019-07-06 MED ORDER — BD PEN NEEDLE NANO U/F 32G X 4 MM MISC: 1.0000 | Freq: Every day | 0 refills | Status: DC

## 2019-07-06 MED FILL — UNIFINE PENTIPS 32GX5/32": 32G X 4 MM | 90 days supply | Qty: 100 | Fill #0

## 2019-07-06 MED FILL — UNIFINE PENTIPS 32GX5/32: 32G X 4 MM | 90 days supply | Qty: 100 | Fill #0

## 2019-07-06 MED FILL — SAXENDA 18 MG/3 ML PEN: 18 | 30 days supply | Qty: 15 | Fill #0

## 2019-07-07 NOTE — Progress Notes (Signed)
Office: [phone]  /  Fax: [phone] TeleHealth Visit:  Catherine [name] has verbally consented to this TeleHealth visit today. The patient is located at home, the provider is located at the [facility] office. The participants in this visit include the listed provider and patient. The visit was conducted today via telephone call (Webex failed - changed to telephone call).  HPI:   Chief Complaint: OBESITY Catherine [name] is here to discuss her progress with her obesity treatment plan. She is on the Category 4 plan and is following her eating plan approximately 85% of the time. She states she is walking 15-20 minutes 2 times per week. Catherine [name] has done well maintaining her weight. She had gotten off track this summer when she lost her job, but is working again and getting a new routine for meal planning. She is doing well on Saxenda. We were unable to weigh the patient today for this TeleHealth visit. She feels as if she has maintained her weight since her last visit. She has lost 53 lbs since starting treatment with [name].  Insulin Resistance Catherine [name] has a diagnosis of insulin resistance based on her elevated fasting insulin level >5. Last A1c was well controlled at 5.5. Although Catherine [name]'s blood glucose readings are still under good control, insulin resistance puts her at greater risk of metabolic syndrome and diabetes. She is stable on liraglutide and working on diet and exercise. No nausea, vomiting, or hypoglycemia.   ASSESSMENT AND PLAN:  Insulin resistance  Class 3 severe obesity with serious comorbidity and body mass index (BMI) of 60.0 to 69.9 in adult, unspecified obesity type ([place]) - Plan: Liraglutide -Weight Management (SAXENDA) 18 MG/3ML SOPN, Insulin Pen Needle (BD PEN NEEDLE NANO U/F) 32G X 4 MM MISC  PLAN:  Insulin Resistance Catherine [name] will continue to work on weight loss, exercise, and decreasing simple carbohydrates in her diet to help decrease the risk of diabetes.  We dicussed metformin including benefits and risks. She was informed that eating too many simple carbohydrates or too many calories at one sitting increases the likelihood of GI side effects. Catherine [name] will continue liraglutide, diet and exercise. She will follow-up with [name] as directed to monitor her progress.  I spent > than 50% of the 15 minute visit on counseling as documented in the note.  Obesity Catherine [name] is currently in the action stage of change. As such, her goal is to continue with weight loss efforts. She has agreed to follow the Category 4 plan. Catherine [name] has been instructed to work up to a goal of 150 minutes of combined cardio and strengthening exercise per week for weight loss and overall health benefits. We discussed the following Behavioral Modification Strategies today: no skipping meals, work on meal planning and easy cooking plans.  Catherine [name] was given a refill on her Saxenda x1 month. She agrees to follow-up with our clinic in 3 weeks.  Catherine [name] has agreed to follow-up with our clinic in 3 weeks. She was informed of the importance of frequent follow-up visits to maximize her success with intensive lifestyle modifications for her multiple health conditions.  ALLERGIES: Allergies  Allergen Reactions  . Citrus Swelling    MEDICATIONS: Current Outpatient Medications on File Prior to Visit  Medication Sig Dispense Refill  . aspirin-acetaminophen-caffeine (EXCEDRIN MIGRAINE) 250-250-65 MG per tablet Take 2 tablets by mouth 2 (two) times daily as needed for migraine. For headache     . cetirizine (ZYRTEC) 10 MG tablet Take 10 mg by mouth daily.    Marland Kitchen  cyanocobalamin 100 MCG tablet Take 100 mcg by mouth daily.    . diclofenac (CATAFLAM) 50 MG tablet Take 50 mg by mouth 2 (two) times daily at 10 AM and 5 PM.    . famotidine (PEPCID) 10 MG tablet Take 1 tablet (10 mg total) by mouth daily. 30 tablet 6  . fluticasone (FLONASE) 50 MCG/ACT nasal spray Place 2 sprays into both nostrils  daily. 16 g 6  . furosemide (LASIX) 20 MG tablet TAKE 1 TABLET (20 MG TOTAL) BY MOUTH 2 TIMES DAILY. 60 tablet 3  . gabapentin (NEURONTIN) 300 MG capsule TAKE 2 CAPSULES (600 MG TOTAL) BY MOUTH 3 TIMES A DAY 180 capsule 3  . ibuprofen (ADVIL,MOTRIN) 200 MG tablet Take 600-800 mg by mouth every 8 (eight) hours as needed (for pain.).     Marland Kitchen. pantoprazole (PROTONIX) 40 MG tablet Take 1 tablet (40 mg total) by mouth daily. Take 30 minutes prior to breakfast 30 tablet 3  . Vitamin D, Ergocalciferol, (DRISDOL) 1.25 MG (50000 UT) CAPS capsule Take 1 capsule (50,000 Units total) by mouth every 7 (seven) days. 4 capsule 0   No current facility-administered medications on file prior to visit.     PAST MEDICAL HISTORY: Past Medical History:  Diagnosis Date  . Allergy   . Anxiety   . Arthritis   . Asthma    since baby, seasonal  . ASTHMA, UNSPECIFIED 12/10/2006   Qualifier: Diagnosis of  By: Abundio MiuMcGregor, Barbara    . CELLULITIS AND ABSCESS OF LEG EXCEPT FOOT 12/30/2007   Qualifier: Diagnosis of  By: Daphine DeutscherMartin FNP, Zena AmosNykedtra    . Cyst of bone    right side of spine  . DEPENDENT EDEMA, LEGS, BILATERAL 11/22/2008   Qualifier: Diagnosis of  By: Barbaraann Barthelankins MD, [place]    . Depression 2003Dx  . Edema   . Food allergy    citris  . FOOT PAIN, RIGHT 11/22/2008   Qualifier: Diagnosis of  By: Barbaraann Barthelankins MD, [place]    . Gastric ulcer   . GERD (gastroesophageal reflux disease)   . Gout   . Hypertension    took Norvasc for 3 months, doctor discontinued no longer on BP medications  . Joint pain   . KNEE INJURY, LEFT 01/01/2009   Qualifier: Diagnosis of  By: Delrae AlfredMulberry MD, Lanora ManisElizabeth    . Migraine   . ONYCHOMYCOSIS, TOENAILS 11/22/2008   Qualifier: Diagnosis of  By: Barbaraann Barthelankins MD, [place]    . Shortness of breath   . Sleep apnea   . TINEA PEDIS 11/22/2008   Qualifier: Diagnosis of  By: Barbaraann Barthelankins MD, Benetta SparVictoria      PAST SURGICAL HISTORY: Past Surgical History:  Procedure Laterality Date  . DILITATION &  CURRETTAGE/HYSTROSCOPY WITH NOVASURE ABLATION N/A 07/03/2017   Procedure: DILATATION & CURETTAGE/HYSTEROSCOPY WITH NOVASURE ABLATION;  Surgeon: Genia DelLavoie, Marie-Lyne, MD;  Location: [facility];  Service: Gynecology;  Laterality: N/A;  . UPPER GI ENDOSCOPY    . WISDOM TOOTH EXTRACTION  2008   x4    SOCIAL HISTORY: Social History   Tobacco Use  . Smoking status: Former Smoker    Packs/day: 0.50    Years: 18.00    Pack years: 9.00    Types: Cigarettes    Start date: 10/13/1996    Quit date: 01/12/2015    Years since quitting: 4.4  . Smokeless tobacco: Never Used  Substance Use Topics  . Alcohol use: Yes    Alcohol/week: 1.0 standard drinks    Types: 1 Standard drinks or equivalent per  week    Comment: rarely drinks  . Drug use: No    Comment: marjuana - former use, quit 4 years ago    FAMILY HISTORY: Family History  Problem Relation Age of Onset  . Hypertension Mother   . Hyperlipidemia Mother   . Cancer Mother        bone cancer  . Heart disease Mother   . Sudden death Mother   . Stroke Mother   . Thyroid disease Mother   . Sleep apnea Mother   . Obesity Mother   . Sudden death Father   . Hypertension Father   . Heart disease Father   . Hyperlipidemia Father   . Prostate cancer Father   . Lung cancer Father   . Obesity Father   . Alcoholism Father   . Sudden death Brother   . Hypertension Brother   . Hyperlipidemia Brother   . Heart attack Brother   . Heart disease Brother   . Stroke Brother   . Alcoholism Brother   . Drug abuse Brother   . Heart disease Sister   . Hypertension Sister   . Thyroid cancer Sister   . Diabetes Neg Hx   . Colon cancer Neg Hx   . Stomach cancer Neg Hx   . Esophageal cancer Neg Hx   . Rectal cancer Neg Hx   . Liver cancer Neg Hx    ROS: Review of Systems  Gastrointestinal: Negative for nausea and vomiting.  Endo/Heme/Allergies:       Negative for hypoglycemia.   PHYSICAL EXAM: Pt in no acute distress  RECENT LABS AND TESTS:  BMET    Component Value Date/Time   NA 140 09/27/2018 1602   K 4.7 09/27/2018 1602   CL 100 09/27/2018 1602   CO2 24 09/27/2018 1602   GLUCOSE 73 09/27/2018 1602   GLUCOSE 83 06/04/2016 1155   BUN 16 09/27/2018 1602   CREATININE 1.18 (H) 09/27/2018 1602   CREATININE 0.90 06/04/2016 1155   CALCIUM 9.2 09/27/2018 1602   GFRNONAA 59 (L) 09/27/2018 1602   GFRNONAA 84 06/04/2016 1155   GFRAA 69 09/27/2018 1602   GFRAA >89 06/04/2016 1155   Lab Results  Component Value Date   HGBA1C 5.5 09/28/2018   HGBA1C 5.4 05/24/2018   HGBA1C 5.3 12/10/2017   HGBA1C 5.4 06/25/2017   HGBA1C 5.2 05/19/2017   Lab Results  Component Value Date   INSULIN 29.7 (H) 09/28/2018   INSULIN 28.3 (H) 05/24/2018   INSULIN 43.6 (H) 12/10/2017   INSULIN 30.4 (H) 06/25/2017   INSULIN 29.7 (H) 01/20/2017   CBC    Component Value Date/Time   WBC 10.6 12/10/2017 1242   WBC 13.3 (H) 07/03/2017 0610   RBC 4.64 12/10/2017 1242   RBC 4.33 07/03/2017 0610   HGB 12.5 12/10/2017 1242   HCT 38.4 12/10/2017 1242   PLT 293 07/03/2017 0610   PLT 343 05/18/2017 1611   MCV 83 12/10/2017 1242   MCH 26.9 12/10/2017 1242   MCH 25.9 (L) 07/03/2017 0610   MCHC 32.6 12/10/2017 1242   MCHC 32.4 07/03/2017 0610   RDW 16.2 (H) 12/10/2017 1242   LYMPHSABS 5.2 (H) 12/10/2017 1242   MONOABS 0.6 07/03/2017 0610   EOSABS 0.0 12/10/2017 1242   BASOSABS 0.0 12/10/2017 1242   Iron/TIBC/Ferritin/ %Sat    Component Value Date/Time   IRON 25 (L) 05/18/2017 1611   TIBC 363 05/18/2017 1611   FERRITIN 16 05/18/2017 1611   IRONPCTSAT 7 (LL) 05/18/2017 1611  Lipid Panel     Component Value Date/Time   CHOL 146 05/24/2018 1044   TRIG 53 05/24/2018 1044   HDL 41 05/24/2018 1044   CHOLHDL 3.6 11/21/2015 1047   VLDL 10 11/21/2015 1047   LDLCALC 94 05/24/2018 1044   Hepatic Function Panel     Component Value Date/Time   PROT 7.3 05/24/2018 1044   ALBUMIN 4.1 05/24/2018 1044   AST 29 05/24/2018 1044   ALT 24  05/24/2018 1044   ALKPHOS 70 05/24/2018 1044   BILITOT 0.3 05/24/2018 1044      Component Value Date/Time   TSH 1.43 05/19/2017 1245   TSH 1.460 10/27/2016 1058   TSH 0.96 11/21/2015 1047   Results for BETTYLEE, FEIG (MRN [id number]) as of 07/07/2019 11:22  Ref. Range 09/28/2018 09:09  Vitamin D, 25-Hydroxy Latest Ref Range: 30.0 - 100.0 ng/mL 25.6 (L)   I, Marianna Payment, am acting as Energy manager for Quillian Quince, MD I have reviewed the above documentation for accuracy and completeness, and I agree with the above. -Quillian Quince, MD

## 2019-07-11 NOTE — Telephone Encounter (Signed)
Pt informed. Pt spouse Catherine [name] ok to pick up form for her. Leanndra Pember Kennon Holter, CMA

## 2019-07-26 ENCOUNTER — Ambulatory Visit (INDEPENDENT_AMBULATORY_CARE_PROVIDER_SITE_OTHER): Payer: 59 | Admitting: Family Medicine

## 2019-07-26 ENCOUNTER — Other Ambulatory Visit: Payer: Self-pay

## 2019-07-26 VITALS — BP 97/56 | HR 97 | Temp 98.3°F | Ht 67.0 in | Wt >= 6400 oz

## 2019-07-26 DIAGNOSIS — Z9189 Other specified personal risk factors, not elsewhere classified: Secondary | ICD-10-CM | POA: Diagnosis not present

## 2019-07-26 DIAGNOSIS — R5383 Other fatigue: Secondary | ICD-10-CM | POA: Diagnosis not present

## 2019-07-26 DIAGNOSIS — E8881 Metabolic syndrome: Secondary | ICD-10-CM | POA: Diagnosis not present

## 2019-07-26 DIAGNOSIS — Z6841 Body Mass Index (BMI) 40.0 and over, adult: Secondary | ICD-10-CM | POA: Diagnosis not present

## 2019-07-26 DIAGNOSIS — E559 Vitamin D deficiency, unspecified: Secondary | ICD-10-CM | POA: Diagnosis not present

## 2019-07-26 MED ORDER — VITAMIN D (ERGOCALCIFEROL) 1.25 MG (50000 UNIT) PO CAPS: 50000.0000 [IU] | ORAL_CAPSULE | ORAL | 0 refills | Status: DC

## 2019-07-26 MED FILL — VIT D2 1.25 MG (50,000 UNIT: 1.25 MG | 28 days supply | Qty: 4 | Fill #0

## 2019-07-27 LAB — COMPREHENSIVE METABOLIC PANEL
ALT: 25 IU/L (ref 0–32)
AST: 25 IU/L (ref 0–40)
Albumin/Globulin Ratio: 1.3 (ref 1.2–2.2)
Albumin: 3.8 g/dL (ref 3.8–4.8)
Alkaline Phosphatase: 68 IU/L (ref 39–117)
BUN/Creatinine Ratio: 17 (ref 9–23)
BUN: 14 mg/dL (ref 6–20)
Bilirubin Total: 0.4 mg/dL (ref 0.0–1.2)
CO2: 22 mmol/L (ref 20–29)
Calcium: 9.1 mg/dL (ref 8.7–10.2)
Chloride: 104 mmol/L (ref 96–106)
Creatinine, Ser: 0.84 mg/dL (ref 0.57–1.00)
GFR calc Af Amer: 103 mL/min/{1.73_m2} (ref >59)
GFR calc non Af Amer: 89 mL/min/{1.73_m2} (ref >59)
Globulin, Total: 3 g/dL (ref 1.5–4.5)
Glucose: 93 mg/dL (ref 65–99)
Potassium: 4.5 mmol/L (ref 3.5–5.2)
Sodium: 140 mmol/L (ref 134–144)
Total Protein: 6.8 g/dL (ref 6.0–8.5)

## 2019-07-27 LAB — CBC WITH DIFFERENTIAL/PLATELET
Basophils Absolute: 0 10*3/uL (ref 0.0–0.2)
Basos: 0 %
EOS (ABSOLUTE): 0 10*3/uL (ref 0.0–0.4)
Eos: 0 %
Hematocrit: 38.1 % (ref 34.0–46.6)
Hemoglobin: 12.9 g/dL (ref 11.1–15.9)
Immature Grans (Abs): 0 10*3/uL (ref 0.0–0.1)
Immature Granulocytes: 0 %
Lymphocytes Absolute: 4.8 10*3/uL — ABNORMAL HIGH (ref 0.7–3.1)
Lymphs: 43 %
MCH: 29.7 pg (ref 26.6–33.0)
MCHC: 33.9 g/dL (ref 31.5–35.7)
MCV: 88 fL (ref 79–97)
Monocytes Absolute: 1.1 10*3/uL — ABNORMAL HIGH (ref 0.1–0.9)
Monocytes: 10 %
Neutrophils Absolute: 5.2 10*3/uL (ref 1.4–7.0)
Neutrophils: 47 %
Platelets: 219 10*3/uL (ref 150–450)
RBC: 4.34 x10E6/uL (ref 3.77–5.28)
RDW: 12.9 % (ref 11.7–15.4)
WBC: 11.1 10*3/uL — ABNORMAL HIGH (ref 3.4–10.8)

## 2019-07-27 LAB — LIPID PANEL WITH LDL/HDL RATIO
Cholesterol, Total: 146 mg/dL (ref 100–199)
HDL: 46 mg/dL (ref >39)
LDL Chol Calc (NIH): 90 mg/dL (ref 0–99)
LDL/HDL Ratio: 2 ratio (ref 0.0–3.2)
Triglycerides: 45 mg/dL (ref 0–149)
VLDL Cholesterol Cal: 10 mg/dL (ref 5–40)

## 2019-07-27 LAB — VITAMIN D 25 HYDROXY (VIT D DEFICIENCY, FRACTURES): Vit D, 25-Hydroxy: 34.3 ng/mL (ref 30.0–100.0)

## 2019-07-27 LAB — HEMOGLOBIN A1C
Est. average glucose Bld gHb Est-mCnc: 108 mg/dL
Hgb A1c MFr Bld: 5.4 % (ref 4.8–5.6)

## 2019-07-27 LAB — T3: T3, Total: 116 ng/dL (ref 71–180)

## 2019-07-27 LAB — INSULIN, RANDOM: INSULIN: 41.7 u[IU]/mL — ABNORMAL HIGH (ref 2.6–24.9)

## 2019-07-27 LAB — T4, FREE: Free T4: 1.38 ng/dL (ref 0.82–1.77)

## 2019-07-27 LAB — TSH: TSH: 2.16 u[IU]/mL (ref 0.450–4.500)

## 2019-07-27 NOTE — Progress Notes (Signed)
Office: [phone]  /  Fax: [phone]   HPI:   Chief Complaint: OBESITY Catherine [name] is here to discuss her progress with her obesity treatment plan. She is on the Category 4 plan and is following her eating plan approximately 85 % of the time. She states she is doing moderate walking 25 minutes 2 to 3 times per week. Catherine [name]'s last in-office visit was 12/07/18 and she has gained 34 pounds since then. She is on Saxenda 2.4 mg and she feels it helps with appetite. She is back on the plan and reports she is doing well. Her hunger is controlled. She skips meals occasionally. She reports her wife will be having weight loss surgery soon. Her weight is (!) 449 lb (203.7 kg) today and she has had a weight gain of 34 pounds since her last in-office visit. She has lost 19 lbs since starting treatment with Catherine [name].  Vitamin D deficiency Catherine [name] has a diagnosis of vitamin D deficiency. Catherine [name] is currently taking vit D and her last vitamin D level was at 25.6 on 09/28/2018. She denies nausea, vomiting or muscle weakness.  At risk for osteopenia and osteoporosis Catherine [name] is at higher risk of osteopenia and osteoporosis due to vitamin D deficiency.   Fatigue Catherine [name] reports some fatigue and her thyroid hormones have not been checked for two years.   ASSESSMENT AND PLAN:  Vitamin D deficiency - Plan: Vitamin D, Ergocalciferol, (DRISDOL) 1.25 MG (50000 UT) CAPS capsule, VITAMIN D 25 Hydroxy (Vit-D Deficiency, Fractures)  Other fatigue - Plan: CBC with Differential/Platelet, Lipid Panel With LDL/HDL Ratio, T3, T4, free, TSH  Insulin resistance - Plan: Comprehensive metabolic panel, Hemoglobin A1c, Insulin, random  At risk for osteoporosis  Class 3 severe obesity with serious comorbidity and body mass index (BMI) greater than or equal to 70 in adult, unspecified obesity type (HCC)  PLAN:  Vitamin D Deficiency [name] was informed that low vitamin D levels contributes to fatigue and are associated with  obesity, breast, and colon cancer. [name] agrees to continue to take prescription Vit D ,000 IU every week #4 with no refills and she will follow up for routine testing of vitamin D, at least 2-3 times per year. She was informed of the risk of over-replacement of vitamin D and agrees to not increase her dose unless she discusses this with Catherine [name] first. [name] agrees to follow up with our clinic in 2 weeks.  At risk for osteopenia and osteoporosis Catherine [name] was given extended  (15 minutes) osteoporosis prevention counseling today. Catherine [name] is at risk for osteopenia and osteoporosis due to her vitamin D deficiency. She was encouraged to take her vitamin D and follow her higher calcium diet and increase strengthening exercise to help strengthen her bones and decrease her risk of osteopenia and osteoporosis.  Fatigue Catherine [name] was informed that her fatigue may be related to obesity, depression or many other causes. Labs will be ordered (TSH, CBC, FLP), and in the meanwhile Catherine [name] has agreed to work on diet, exercise and weight loss to help with fatigue. Proper sleep hygiene was discussed including the need for 7-8 hours of quality sleep each night.  Obesity Catherine [name] is currently in the action stage of change. As such, her goal is to continue with weight loss efforts She has agreed to follow the Category 4 plan Catherine [name] will continue moderate walking 25 minutes, 2 to 3 times per week for weight loss and overall health benefits. We discussed the following Behavioral Modification Strategies today: planning for success, no skipping  meals and increasing lean protein intake  Catherine [name] will continue Saxenda 2.4 mg until the next visit.  Catherine [name] has agreed to follow up with our clinic in 2 weeks. She was informed of the importance of frequent follow up visits to maximize her success with intensive lifestyle modifications for her multiple health conditions.  ALLERGIES: Allergies  Allergen Reactions  . Citrus  Swelling    MEDICATIONS: Current Outpatient Medications on File Prior to Visit  Medication Sig Dispense Refill  . aspirin-acetaminophen-caffeine (EXCEDRIN MIGRAINE) 250-250-65 MG per tablet Take 2 tablets by mouth 2 (two) times daily as needed for migraine. For headache     . cetirizine (ZYRTEC) 10 MG tablet Take 10 mg by mouth daily.    . cyanocobalamin 100 MCG tablet Take 100 mcg by mouth daily.    . diclofenac (CATAFLAM) 50 MG tablet Take 50 mg by mouth 2 (two) times daily at 10 AM and 5 PM.    . famotidine (PEPCID) 10 MG tablet Take 1 tablet (10 mg total) by mouth daily. 30 tablet 6  . fluticasone (FLONASE) 50 MCG/ACT nasal spray Place 2 sprays into both nostrils daily. 16 g 6  . furosemide (LASIX) 20 MG tablet TAKE 1 TABLET (20 MG TOTAL) BY MOUTH 2 TIMES DAILY. 60 tablet 3  . gabapentin (NEURONTIN) 300 MG capsule TAKE 2 CAPSULES (600 MG TOTAL) BY MOUTH 3 TIMES A DAY 180 capsule 3  . ibuprofen (ADVIL,MOTRIN) 200 MG tablet Take 600-800 mg by mouth every 8 (eight) hours as needed (for pain.).     . Insulin Pen Needle (BD PEN NEEDLE NANO U/F) 32G X 4 MM MISC 1 Package by Does not apply route daily at 12 noon. 100 each 0  . Liraglutide -Weight Management (SAXENDA) 18 MG/3ML SOPN Inject 3 mg into the skin daily. 5 pen 0  . pantoprazole (PROTONIX) 40 MG tablet Take 1 tablet (40 mg total) by mouth daily. Take 30 minutes prior to breakfast 30 tablet 3   No current facility-administered medications on file prior to visit.     PAST MEDICAL HISTORY: Past Medical History:  Diagnosis Date  . Allergy   . Anxiety   . Arthritis   . Asthma    since baby, seasonal  . ASTHMA, UNSPECIFIED 12/10/2006   Qualifier: Diagnosis of  By: Abundio MiuMcGregor, Barbara    . CELLULITIS AND ABSCESS OF LEG EXCEPT FOOT 12/30/2007   Qualifier: Diagnosis of  By: Daphine DeutscherMartin FNP, Zena AmosNykedtra    . Cyst of bone    right side of spine  . DEPENDENT EDEMA, LEGS, BILATERAL 11/22/2008   Qualifier: Diagnosis of  By: Barbaraann Barthelankins MD, [place]    .  Depression 2003Dx  . Edema   . Food allergy    citris  . FOOT PAIN, RIGHT 11/22/2008   Qualifier: Diagnosis of  By: Barbaraann Barthelankins MD, [place]    . Gastric ulcer   . GERD (gastroesophageal reflux disease)   . Gout   . Hypertension    took Norvasc for 3 months, doctor discontinued no longer on BP medications  . Joint pain   . KNEE INJURY, LEFT 01/01/2009   Qualifier: Diagnosis of  By: Delrae AlfredMulberry MD, Lanora ManisElizabeth    . Migraine   . ONYCHOMYCOSIS, TOENAILS 11/22/2008   Qualifier: Diagnosis of  By: Barbaraann Barthelankins MD, [place]    . Shortness of breath   . Sleep apnea   . TINEA PEDIS 11/22/2008   Qualifier: Diagnosis of  By: Barbaraann Barthelankins MD, Benetta SparVictoria      PAST SURGICAL HISTORY: Past  Surgical History:  Procedure Laterality Date  . DILITATION & CURRETTAGE/HYSTROSCOPY WITH NOVASURE ABLATION N/A 07/03/2017   Procedure: DILATATION & CURETTAGE/HYSTEROSCOPY WITH NOVASURE ABLATION;  Surgeon: Princess Bruins, MD;  Location: [facility];  Service: Gynecology;  Laterality: N/A;  . UPPER GI ENDOSCOPY    . WISDOM TOOTH EXTRACTION  2008   x4    SOCIAL HISTORY: Social History   Tobacco Use  . Smoking status: Former Smoker    Packs/day: 0.50    Years: 18.00    Pack years: 9.00    Types: Cigarettes    Start date: 10/13/1996    Quit date: 01/12/2015    Years since quitting: 4.5  . Smokeless tobacco: Never Used  Substance Use Topics  . Alcohol use: Yes    Alcohol/week: 1.0 standard drinks    Types: 1 Standard drinks or equivalent per week    Comment: rarely drinks  . Drug use: No    Comment: marjuana - former use, quit 4 years ago    FAMILY HISTORY: Family History  Problem Relation Age of Onset  . Hypertension Mother   . Hyperlipidemia Mother   . Cancer Mother        bone cancer  . Heart disease Mother   . Sudden death Mother   . Stroke Mother   . Thyroid disease Mother   . Sleep apnea Mother   . Obesity Mother   . Sudden death Father   . Hypertension Father   . Heart disease Father   . Hyperlipidemia  Father   . Prostate cancer Father   . Lung cancer Father   . Obesity Father   . Alcoholism Father   . Sudden death Brother   . Hypertension Brother   . Hyperlipidemia Brother   . Heart attack Brother   . Heart disease Brother   . Stroke Brother   . Alcoholism Brother   . Drug abuse Brother   . Heart disease Sister   . Hypertension Sister   . Thyroid cancer Sister   . Diabetes Neg Hx   . Colon cancer Neg Hx   . Stomach cancer Neg Hx   . Esophageal cancer Neg Hx   . Rectal cancer Neg Hx   . Liver cancer Neg Hx     ROS: Review of Systems  Constitutional: Positive for malaise/fatigue. Negative for weight loss.  Gastrointestinal: Negative for nausea and vomiting.  Musculoskeletal:       Negative for muscle weakness    PHYSICAL EXAM: Blood pressure (!) 97/56, pulse 97, temperature 98.3 F (36.8 C), temperature source Oral, height 5\' 7"  (1.702 m), weight (!) 449 lb (203.7 kg), SpO2 100 %. Body mass index is 70.32 kg/m. Physical Exam Vitals signs reviewed.  Constitutional:      Appearance: Normal appearance. She is well-developed. She is obese.  Cardiovascular:     Rate and Rhythm: Normal rate.  Pulmonary:     Effort: Pulmonary effort is normal.  Musculoskeletal: Normal range of motion.  Skin:    General: Skin is warm and dry.  Neurological:     Mental Status: She is alert and oriented to person, place, and time.  Psychiatric:        Mood and Affect: Mood normal.        Behavior: Behavior normal.     RECENT LABS AND TESTS: BMET    Component Value Date/Time   NA 140 07/26/2019 0750   K 4.5 07/26/2019 0750   CL 104 07/26/2019 0750   CO2 22  07/26/2019 0750   GLUCOSE 93 07/26/2019 0750   GLUCOSE 83 06/04/2016 1155   BUN 14 07/26/2019 0750   CREATININE 0.84 07/26/2019 0750   CREATININE 0.90 06/04/2016 1155   CALCIUM 9.1 07/26/2019 0750   GFRNONAA 89 07/26/2019 0750   GFRNONAA 84 06/04/2016 1155   GFRAA 103 07/26/2019 0750   GFRAA >89 06/04/2016 1155   Lab  Results  Component Value Date   HGBA1C 5.4 07/26/2019   HGBA1C 5.5 09/28/2018   HGBA1C 5.4 05/24/2018   HGBA1C 5.3 12/10/2017   HGBA1C 5.4 06/25/2017   Lab Results  Component Value Date   INSULIN 41.7 (H) 07/26/2019   INSULIN 29.7 (H) 09/28/2018   INSULIN 28.3 (H) 05/24/2018   INSULIN 43.6 (H) 12/10/2017   INSULIN 30.4 (H) 06/25/2017   CBC    Component Value Date/Time   WBC 11.1 (H) 07/26/2019 0750   WBC 13.3 (H) 07/03/2017 0610   RBC 4.34 07/26/2019 0750   RBC 4.33 07/03/2017 0610   HGB 12.9 07/26/2019 0750   HCT 38.1 07/26/2019 0750   PLT 219 07/26/2019 0750   MCV 88 07/26/2019 0750   MCH 29.7 07/26/2019 0750   MCH 25.9 (L) 07/03/2017 0610   MCHC 33.9 07/26/2019 0750   MCHC 32.4 07/03/2017 0610   RDW 12.9 07/26/2019 0750   LYMPHSABS 4.8 (H) 07/26/2019 0750   MONOABS 0.6 07/03/2017 0610   EOSABS 0.0 07/26/2019 0750   BASOSABS 0.0 07/26/2019 0750   Iron/TIBC/Ferritin/ %Sat    Component Value Date/Time   IRON 25 (L) 05/18/2017 1611   TIBC 363 05/18/2017 1611   FERRITIN 16 05/18/2017 1611   IRONPCTSAT 7 (LL) 05/18/2017 1611   Lipid Panel     Component Value Date/Time   CHOL 146 07/26/2019 0750   TRIG 45 07/26/2019 0750   HDL 46 07/26/2019 0750   CHOLHDL 3.6 11/21/2015 1047   VLDL 10 11/21/2015 1047   LDLCALC 90 07/26/2019 0750   Hepatic Function Panel     Component Value Date/Time   PROT 6.8 07/26/2019 0750   ALBUMIN 3.8 07/26/2019 0750   AST 25 07/26/2019 0750   ALT 25 07/26/2019 0750   ALKPHOS 68 07/26/2019 0750   BILITOT 0.4 07/26/2019 0750      Component Value Date/Time   TSH 2.160 07/26/2019 0750   TSH 1.43 05/19/2017 1245   TSH 1.460 10/27/2016 1058     Ref. Range 09/28/2018 09:09  Vitamin D, 25-Hydroxy Latest Ref Range: 30.0 - 100.0 ng/mL 25.6 (L)    OBESITY BEHAVIORAL INTERVENTION VISIT  Today's visit was # 55  Starting weight: 468 lbs Starting date: 10/27/2016 Today's weight : 449 lbs  Today's date: 07/26/2019 Total lbs lost to  date: 19    07/26/2019  Height  (1.702 m)  Weight 449 lb (203.7 kg) (A)  BMI (Calculated) 70.31  BLOOD PRESSURE - SYSTOLIC 97  BLOOD PRESSURE - DIASTOLIC 56   Body Fat % 66.4 %    ASK: We discussed the diagnosis of obesity with [name] today and Jatavia agreed to give Catherine [name] permission to discuss obesity behavioral modification therapy today.  ASSESS: Sylvie has the diagnosis of obesity and her BMI today is 70.31 Aralynn is in the action stage of change   ADVISE: Evonne was educated on the multiple health risks of obesity as well as the benefit of weight loss to improve her health. She was advised of the need for long term treatment and the importance of lifestyle modifications to improve her current  health and to decrease her risk of future health problems.  AGREE: Multiple dietary modification options and treatment options were discussed and  Ellene agreed to follow the recommendations documented in the above note.  ARRANGE: Karielle was educated on the importance of frequent visits to treat obesity as outlined per CMS and USPSTF guidelines and agreed to schedule her next follow up appointment today.  Cristi Loron, am acting as Energy manager for [place], FNP-C.  I have reviewed the above documentation for accuracy and completeness, and I agree with the above.  - Dimonique Bourdeau, FNP-C.

## 2019-07-28 ENCOUNTER — Encounter (INDEPENDENT_AMBULATORY_CARE_PROVIDER_SITE_OTHER): Payer: Self-pay | Admitting: Family Medicine

## 2019-08-02 MED FILL — PANTOPRAZOLE SOD DR 40 MG T: 40 | 90 days supply | Qty: 90 | Fill #0

## 2019-08-11 ENCOUNTER — Encounter (INDEPENDENT_AMBULATORY_CARE_PROVIDER_SITE_OTHER): Payer: Self-pay | Admitting: Family Medicine

## 2019-08-11 ENCOUNTER — Other Ambulatory Visit: Payer: Self-pay

## 2019-08-11 ENCOUNTER — Ambulatory Visit (INDEPENDENT_AMBULATORY_CARE_PROVIDER_SITE_OTHER): Payer: 59 | Admitting: Family Medicine

## 2019-08-11 VITALS — BP 110/65 | HR 91 | Temp 98.0°F | Ht 67.0 in | Wt >= 6400 oz

## 2019-08-11 DIAGNOSIS — Z6841 Body Mass Index (BMI) 40.0 and over, adult: Secondary | ICD-10-CM

## 2019-08-11 DIAGNOSIS — E8881 Metabolic syndrome: Secondary | ICD-10-CM | POA: Diagnosis not present

## 2019-08-11 NOTE — Progress Notes (Signed)
Office: [phone]  /  Fax: [phone]   HPI:   Chief Complaint: OBESITY Catherine [name] is here to discuss her progress with her obesity treatment plan. She is on the Category 4 plan and is following her eating plan approximately 85 % of the time. She states she is exercising 0 minutes 0 times per week. Catherine [name] is eating all of her food on the plan. She is on Saxenda 2.4 mg daily without side effects. Her hunger is well controlled.  Her weight is (!) 443 lb (200.9 kg) today and [name] had a weight loss of 6 pounds over a period of 2 weeks since her last visit. She [name] lost 25 lbs since starting treatment with us.  Insulin Resistance Catherine [name] [name] a diagnosis of insulin resistance based on her elevated fasting insulin level >5. Although Catherine [name] blood glucose readings are still under good control, insulin resistance puts her at greater risk of metabolic syndrome and diabetes. She is not taking metformin as she had GI problems with the metformin. Catherine [name] continues to work on diet and exercise to decrease risk of diabetes. She denies polyphagia. Lab Results  Component Value Date   HGBA1C 5.4 07/26/2019    ASSESSMENT AND PLAN:  Insulin resistance  Class 3 severe obesity with serious comorbidity and body mass index (BMI) of 60.0 to 69.9 in adult, unspecified obesity type (HCC)  PLAN:  Insulin Resistance Catherine [name] will continue to work on weight loss, exercise, and decreasing simple carbohydrates in her diet to help decrease the risk of diabetes. Catherine [name] [name] to continue her meal plan. Catherine [name] [name] to follow up with us as directed to monitor her progress in 3 weeks.  Obesity Catherine [name] is currently in the action stage of change. As such, her goal is to continue with weight loss efforts. She [name] [name] to follow the Category 4 plan. Catherine [name] was given the Recipes information handout. Catherine [name] [name] been instructed to walk 2 times per week as tolerated. We discussed the following Behavioral  Modification Strategies today: work on meal planning and easy cooking plans and planning for success. We discussed various medication options and Catherine [name] to continue Saxenda 2.4 mg daily.   Catherine [name] [name] [name] to follow up with our clinic in 3 weeks. She was informed of the importance of frequent follow up visits to maximize her success with intensive lifestyle modifications for her multiple health conditions.  ALLERGIES: Allergies  Allergen Reactions  . Citrus Swelling    MEDICATIONS: Current Outpatient Medications on File Prior to Visit  Medication Sig Dispense Refill  . aspirin-acetaminophen-caffeine (EXCEDRIN MIGRAINE) 250-250-65 MG per tablet Take 2 tablets by mouth 2 (two) times daily as needed for migraine. For headache     . cetirizine (ZYRTEC) 10 MG tablet Take 10 mg by mouth daily.    . cyanocobalamin 100 MCG tablet Take 100 mcg by mouth daily.    . diclofenac (CATAFLAM) 50 MG tablet Take 50 mg by mouth 2 (two) times daily at 10 AM and 5 PM.    . famotidine (PEPCID) 10 MG tablet Take 1 tablet (10 mg total) by mouth daily. 30 tablet 6  . fluticasone (FLONASE) 50 MCG/ACT nasal spray Place 2 sprays into both nostrils daily. 16 g 6  . furosemide (LASIX) 20 MG tablet TAKE 1 TABLET (20 MG TOTAL) BY MOUTH 2 TIMES DAILY. 60 tablet 3  . gabapentin (NEURONTIN) 300 MG capsule TAKE 2 CAPSULES (600 MG TOTAL) BY MOUTH 3 TIMES A DAY 180 capsule 3  . ibuprofen (ADVIL,MOTRIN) 200  MG tablet Take 600-800 mg by mouth every 8 (eight) hours as needed (for pain.).     . Insulin Pen Needle (BD PEN NEEDLE NANO U/F) 32G X 4 MM MISC 1 Package by Does not apply route daily at 12 noon. 100 each 0  . Liraglutide -Weight Management (SAXENDA) 18 MG/3ML SOPN Inject 3 mg into the skin daily. 5 pen 0  . pantoprazole (PROTONIX) 40 MG tablet Take 1 tablet (40 mg total) by mouth daily. Take 30 minutes prior to breakfast 30 tablet 3  . Vitamin D, Ergocalciferol, (DRISDOL) 1.25 MG (50000 UT) CAPS capsule Take 1  capsule (50,000 Units total) by mouth every 7 (seven) days. 4 capsule 0   No current facility-administered medications on file prior to visit.     PAST MEDICAL HISTORY: Past Medical History:  Diagnosis Date  . Allergy   . Anxiety   . Arthritis   . Asthma    since baby, seasonal  . ASTHMA, UNSPECIFIED 12/10/2006   Qualifier: Diagnosis of  By: Abundio Miu    . CELLULITIS AND ABSCESS OF LEG EXCEPT FOOT 12/30/2007   Qualifier: Diagnosis of  By: Daphine Deutscher FNP, Zena Amos    . Cyst of bone    right side of spine  . DEPENDENT EDEMA, LEGS, BILATERAL 11/22/2008   Qualifier: Diagnosis of  By: Barbaraann Barthel MD, [place]    . Depression 2003Dx  . Edema   . Food allergy    citris  . FOOT PAIN, RIGHT 11/22/2008   Qualifier: Diagnosis of  By: Barbaraann Barthel MD, [place]    . Gastric ulcer   . GERD (gastroesophageal reflux disease)   . Gout   . Hypertension    took Norvasc for 3 months, doctor discontinued no longer on BP medications  . Joint pain   . KNEE INJURY, LEFT 01/01/2009   Qualifier: Diagnosis of  By: Delrae Alfred MD, Lanora Manis    . Migraine   . ONYCHOMYCOSIS, TOENAILS 11/22/2008   Qualifier: Diagnosis of  By: Barbaraann Barthel MD, [place]    . Shortness of breath   . Sleep apnea   . TINEA PEDIS 11/22/2008   Qualifier: Diagnosis of  By: Barbaraann Barthel MD, Benetta Spar      PAST SURGICAL HISTORY: Past Surgical History:  Procedure Laterality Date  . DILITATION & CURRETTAGE/HYSTROSCOPY WITH NOVASURE ABLATION N/A 07/03/2017   Procedure: DILATATION & CURETTAGE/HYSTEROSCOPY WITH NOVASURE ABLATION;  Surgeon: Genia Del, MD;  Location: [facility];  Service: Gynecology;  Laterality: N/A;  . UPPER GI ENDOSCOPY    . WISDOM TOOTH EXTRACTION  2008   x4    SOCIAL HISTORY: Social History   Tobacco Use  . Smoking status: Former Smoker    Packs/day: 0.50    Years: 18.00    Pack years: 9.00    Types: Cigarettes    Start date: 10/13/1996    Quit date: 01/12/2015    Years since quitting: 4.5  . Smokeless tobacco: Never  Used  Substance Use Topics  . Alcohol use: Yes    Alcohol/week: 1.0 standard drinks    Types: 1 Standard drinks or equivalent per week    Comment: rarely drinks  . Drug use: No    Comment: marjuana - former use, quit 4 years ago    FAMILY HISTORY: Family History  Problem Relation Age of Onset  . Hypertension Mother   . Hyperlipidemia Mother   . Cancer Mother        bone cancer  . Heart disease Mother   . Sudden death Mother   .  Stroke Mother   . Thyroid disease Mother   . Sleep apnea Mother   . Obesity Mother   . Sudden death Father   . Hypertension Father   . Heart disease Father   . Hyperlipidemia Father   . Prostate cancer Father   . Lung cancer Father   . Obesity Father   . Alcoholism Father   . Sudden death Brother   . Hypertension Brother   . Hyperlipidemia Brother   . Heart attack Brother   . Heart disease Brother   . Stroke Brother   . Alcoholism Brother   . Drug abuse Brother   . Heart disease Sister   . Hypertension Sister   . Thyroid cancer Sister   . Diabetes Neg Hx   . Colon cancer Neg Hx   . Stomach cancer Neg Hx   . Esophageal cancer Neg Hx   . Rectal cancer Neg Hx   . Liver cancer Neg Hx     ROS: Review of Systems  Constitutional: Positive for weight loss.  Endo/Heme/Allergies:       Negative for polyphagia.    PHYSICAL EXAM: Blood pressure 110/65, pulse 91, temperature 98 F (36.7 C), temperature source Oral, height 5\' 7"  (1.702 m), weight (!) 443 lb (200.9 kg), SpO2 99 %. Body mass index is 69.38 kg/m. Physical Exam Vitals signs reviewed.  Constitutional:      Appearance: Normal appearance. She is obese.  Cardiovascular:     Rate and Rhythm: Normal rate.  Pulmonary:     Effort: Pulmonary effort is normal.  Musculoskeletal: Normal range of motion.  Skin:    General: Skin is warm and dry.  Neurological:     Mental Status: She is alert and oriented to person, place, and time.  Psychiatric:        Mood and Affect: Mood normal.         Behavior: Behavior normal.     RECENT LABS AND TESTS: BMET    Component Value Date/Time   NA 140 07/26/2019 0750   K 4.5 07/26/2019 0750   CL 104 07/26/2019 0750   CO2 22 07/26/2019 0750   GLUCOSE 93 07/26/2019 0750   GLUCOSE 83 06/04/2016 1155   BUN 14 07/26/2019 0750   CREATININE 0.84 07/26/2019 0750   CREATININE 0.90 06/04/2016 1155   CALCIUM 9.1 07/26/2019 0750   GFRNONAA 89 07/26/2019 0750   GFRNONAA 84 06/04/2016 1155   GFRAA 103 07/26/2019 0750   GFRAA >89 06/04/2016 1155   Lab Results  Component Value Date   HGBA1C 5.4 07/26/2019   HGBA1C 5.5 09/28/2018   HGBA1C 5.4 05/24/2018   HGBA1C 5.3 12/10/2017   HGBA1C 5.4 06/25/2017   Lab Results  Component Value Date   INSULIN 41.7 (H) 07/26/2019   INSULIN 29.7 (H) 09/28/2018   INSULIN 28.3 (H) 05/24/2018   INSULIN 43.6 (H) 12/10/2017   INSULIN 30.4 (H) 06/25/2017   CBC    Component Value Date/Time   WBC 11.1 (H) 07/26/2019 0750   WBC 13.3 (H) 07/03/2017 0610   RBC 4.34 07/26/2019 0750   RBC 4.33 07/03/2017 0610   HGB 12.9 07/26/2019 0750   HCT 38.1 07/26/2019 0750   PLT 219 07/26/2019 0750   MCV 88 07/26/2019 0750   MCH 29.7 07/26/2019 0750   MCH 25.9 (L) 07/03/2017 0610   MCHC 33.9 07/26/2019 0750   MCHC 32.4 07/03/2017 0610   RDW 12.9 07/26/2019 0750   LYMPHSABS 4.8 (H) 07/26/2019 0750   MONOABS 0.6 07/03/2017  0610   EOSABS 0.0 07/26/2019 0750   BASOSABS 0.0 07/26/2019 0750   Iron/TIBC/Ferritin/ %Sat    Component Value Date/Time   IRON 25 (L) 05/18/2017 1611   TIBC 363 05/18/2017 1611   FERRITIN 16 05/18/2017 1611   IRONPCTSAT 7 (LL) 05/18/2017 1611   Lipid Panel     Component Value Date/Time   CHOL 146 07/26/2019 0750   TRIG 45 07/26/2019 0750   HDL 46 07/26/2019 0750   CHOLHDL 3.6 11/21/2015 1047   VLDL 10 11/21/2015 1047   LDLCALC 90 07/26/2019 0750   Hepatic Function Panel     Component Value Date/Time   PROT 6.8 07/26/2019 0750   ALBUMIN 3.8 07/26/2019 0750   AST 25  07/26/2019 0750   ALT 25 07/26/2019 0750   ALKPHOS 68 07/26/2019 0750   BILITOT 0.4 07/26/2019 0750      Component Value Date/Time   TSH 2.160 07/26/2019 0750   TSH 1.43 05/19/2017 1245   TSH 1.460 10/27/2016 1058   Results for JOURNEI, THOMASSEN (MRN [id number]) as of 08/11/2019 08:14  Ref. Range 07/26/2019 07:50  Vitamin D, 25-Hydroxy Latest Ref Range: 30.0 - 100.0 ng/mL 34.3     OBESITY BEHAVIORAL INTERVENTION VISIT  Today's visit was # 56  Starting weight: 468 lbs Starting date: 10/27/2016 Today's weight : Weight: (!) 443 lb (200.9 kg)  Today's date: 08/11/2019 Total lbs lost to date: 25    08/11/2019  Height 5\' 7"  (1.702 m)  Weight 443 lb (200.9 kg) (A)  BMI (Calculated) 69.37  BLOOD PRESSURE - SYSTOLIC 110  BLOOD PRESSURE - DIASTOLIC 65   Body Fat % 60.8 %    ASK: We discussed the diagnosis of obesity with today and Catherine [name] [name] to give Catherine [name] permission to discuss obesity behavioral modification therapy today.  ASSESS: Catherine [name] the diagnosis of obesity and her BMI today is 69.37. Shakema is in the action stage of change.   ADVISE: Catherine [name] was educated on the multiple health risks of obesity as well as the benefit of weight loss to improve her health. She was advised of the need for long term treatment and the importance of lifestyle modifications to improve her current health and to decrease her risk of future health problems.  AGREE: Multiple dietary modification options and treatment options were discussed and Catherine [name] [name] to follow the recommendations documented in the above note.  ARRANGE: Catherine [name] was educated on the importance of frequent visits to treat obesity as outlined per CMS and USPSTF guidelines and [name] to schedule her next follow up appointment today.  Catherine [name], CMA, am acting as [employer] for [employer] manager, FNP-C  I have reviewed the above documentation for accuracy and completeness, and I agree with the  above.  - Jessicaann Overbaugh, FNP-C.

## 2019-09-01 ENCOUNTER — Other Ambulatory Visit: Payer: Self-pay

## 2019-09-01 ENCOUNTER — Encounter (INDEPENDENT_AMBULATORY_CARE_PROVIDER_SITE_OTHER): Payer: Self-pay | Admitting: Family Medicine

## 2019-09-01 ENCOUNTER — Ambulatory Visit (INDEPENDENT_AMBULATORY_CARE_PROVIDER_SITE_OTHER): Payer: Self-pay | Admitting: Family Medicine

## 2019-09-01 VITALS — BP 112/71 | HR 104 | Temp 98.5°F | Ht 67.0 in | Wt >= 6400 oz

## 2019-09-01 DIAGNOSIS — E559 Vitamin D deficiency, unspecified: Secondary | ICD-10-CM

## 2019-09-01 DIAGNOSIS — K219 Gastro-esophageal reflux disease without esophagitis: Secondary | ICD-10-CM

## 2019-09-01 DIAGNOSIS — Z9189 Other specified personal risk factors, not elsewhere classified: Secondary | ICD-10-CM

## 2019-09-01 DIAGNOSIS — Z6841 Body Mass Index (BMI) 40.0 and over, adult: Secondary | ICD-10-CM

## 2019-09-01 MED ORDER — SAXENDA 18 MG/3ML ~~LOC~~ SOPN: 3.0000 mg | PEN_INJECTOR | Freq: Every day | SUBCUTANEOUS | 0 refills | Status: DC

## 2019-09-01 MED ORDER — VITAMIN D (ERGOCALCIFEROL) 1.25 MG (50000 UNIT) PO CAPS: 50000.0000 [IU] | ORAL_CAPSULE | ORAL | 0 refills | Status: DC

## 2019-09-02 ENCOUNTER — Other Ambulatory Visit: Payer: Self-pay | Admitting: Family Medicine

## 2019-09-02 DIAGNOSIS — R609 Edema, unspecified: Secondary | ICD-10-CM

## 2019-09-02 MED FILL — FUROSEMIDE 20 MG TABS: 20 | 90 days supply | Qty: 180 | Fill #0

## 2019-09-05 NOTE — Progress Notes (Signed)
Office: [phone]  /  Fax: [phone]   HPI:   Chief Complaint: OBESITY Catherine [name] is here to discuss her progress with her obesity treatment plan. She is on the Category 4 plan and is following her eating plan approximately 85 % of the time. She states she is exercising 0 minutes 0 times per week. Catherine [name] reports sticking to the plan well and eating all of the protein. She denies drinking sweet sugary beverages. Catherine [name] feels she has gained fluid weight. She denies going over on her extra calories. Catherine [name] is on Saxenda at 2.4 mg. The 3 mg dose gives her diarrhea. Catherine [name] denies polyphagia. Her weight is (!) 449 lb (203.7 kg) today and has had a weight gain of 6 pounds over a period of 3 weeks since her last visit. She has lost 19 lbs since starting treatment with [name].  Vitamin D deficiency Catherine [name] has a diagnosis of vitamin D deficiency. Her last vitamin D level was at 34.3 on 07/26/19 and was not at goal. Catherine [name] is currently taking vit D and she denies nausea, vomiting or muscle weakness.  At risk for osteopenia and osteoporosis Catherine [name] is at higher risk of osteopenia and osteoporosis due to vitamin D deficiency.   GERD (gastroesophageal reflux disease) Catherine [name] has a diagnosis of gastroesophageal reflux disease and it is well controlled on Protonix. She has been on Protonix for about six months.  ASSESSMENT AND PLAN:  Vitamin D deficiency - Plan: Vitamin D, Ergocalciferol, (DRISDOL) 1.25 MG (50000 UT) CAPS capsule  Gastroesophageal reflux disease, unspecified whether esophagitis present  At risk for osteoporosis  Class 3 severe obesity with serious comorbidity and body mass index (BMI) of 60.0 to 69.9 in adult, unspecified obesity type (HCC) - Plan: Liraglutide -Weight Management (SAXENDA) 18 MG/3ML SOPN  PLAN:  Vitamin D Deficiency Catherine [name] was informed that low vitamin D levels contributes to fatigue and are associated with obesity, breast, and colon cancer. Catherine [name] agrees  to continue to take prescription Vit D ,000 IU every week #4 with no refills and she will follow up for routine testing of vitamin D, at least 2-3 times per year. She was informed of the risk of over-replacement of vitamin D and agrees to not increase her dose unless she discusses this with [name] first. Catherine [name] agrees to follow up with our clinic in 3 weeks.  At risk for osteopenia and osteoporosis Catherine [name] was given extended (15 minutes) osteoporosis prevention counseling today. Catherine [name] is at risk for osteopenia and osteoporosis due to her vitamin D deficiency. She was encouraged to take her vitamin D and follow her higher calcium diet and increase strengthening exercise to help strengthen her bones and decrease her risk of osteopenia and osteoporosis.  GERD (gastroesophageal reflux disease) Catherine [name] will continue Protonix and follow up with our clinic in 3 weeks.  Obesity Catherine [name] is currently in the action stage of change. As such, her goal is to continue with weight loss efforts She has agreed to follow the Category 4 plan Catherine [name] will start back exercising for weight loss and overall health benefits. We discussed the following Behavioral Modification Strategies today: planning for success, work on meal planning and easy cooking plans and holiday eating strategies   Catherine [name] agrees to continue Saxenda 3.0 mg subQ daily #5 pens with no refills and she will continue with 2.4 mg daily. Handouts for holiday recipes were given to patient today.  Catherine [name] has agreed to follow up with our clinic in 3 weeks. She was informed of the importance of  frequent follow up visits to maximize her success with intensive lifestyle modifications for her multiple health conditions.  ALLERGIES: Allergies  Allergen Reactions  . Citrus Swelling    MEDICATIONS: Current Outpatient Medications on File Prior to Visit  Medication Sig Dispense Refill  . aspirin-acetaminophen-caffeine (EXCEDRIN MIGRAINE) 250-250-65 MG  per tablet Take 2 tablets by mouth 2 (two) times daily as needed for migraine. For headache     . cetirizine (ZYRTEC) 10 MG tablet Take 10 mg by mouth daily.    . cyanocobalamin 100 MCG tablet Take 100 mcg by mouth daily.    . diclofenac (CATAFLAM) 50 MG tablet Take 50 mg by mouth 2 (two) times daily at 10 AM and 5 PM.    . famotidine (PEPCID) 10 MG tablet Take 1 tablet (10 mg total) by mouth daily. 30 tablet 6  . fluticasone (FLONASE) 50 MCG/ACT nasal spray Place 2 sprays into both nostrils daily. 16 g 6  . gabapentin (NEURONTIN) 300 MG capsule TAKE 2 CAPSULES (600 MG TOTAL) BY MOUTH 3 TIMES A DAY 180 capsule 3  . ibuprofen (ADVIL,MOTRIN) 200 MG tablet Take 600-800 mg by mouth every 8 (eight) hours as needed (for pain.).     . Insulin Pen Needle (BD PEN NEEDLE NANO U/F) 32G X 4 MM MISC 1 Package by Does not apply route daily at 12 noon. 100 each 0  . pantoprazole (PROTONIX) 40 MG tablet Take 1 tablet (40 mg total) by mouth daily. Take 30 minutes prior to breakfast 30 tablet 3   No current facility-administered medications on file prior to visit.     PAST MEDICAL HISTORY: Past Medical History:  Diagnosis Date  . Allergy   . Anxiety   . Arthritis   . Asthma    since baby, seasonal  . ASTHMA, UNSPECIFIED 12/10/2006   Qualifier: Diagnosis of  By: Abundio Miu    . CELLULITIS AND ABSCESS OF LEG EXCEPT FOOT 12/30/2007   Qualifier: Diagnosis of  By: Daphine Deutscher FNP, Zena Amos    . Cyst of bone    right side of spine  . DEPENDENT EDEMA, LEGS, BILATERAL 11/22/2008   Qualifier: Diagnosis of  By: Barbaraann Barthel MD, [place]    . Depression 2003Dx  . Edema   . Food allergy    citris  . FOOT PAIN, RIGHT 11/22/2008   Qualifier: Diagnosis of  By: Barbaraann Barthel MD, [place]    . Gastric ulcer   . GERD (gastroesophageal reflux disease)   . Gout   . Hypertension    took Norvasc for 3 months, doctor discontinued no longer on BP medications  . Joint pain   . KNEE INJURY, LEFT 01/01/2009   Qualifier: Diagnosis  of  By: Delrae Alfred MD, Lanora Manis    . Migraine   . ONYCHOMYCOSIS, TOENAILS 11/22/2008   Qualifier: Diagnosis of  By: Barbaraann Barthel MD, [place]    . Shortness of breath   . Sleep apnea   . TINEA PEDIS 11/22/2008   Qualifier: Diagnosis of  By: Barbaraann Barthel MD, Benetta Spar      PAST SURGICAL HISTORY: Past Surgical History:  Procedure Laterality Date  . DILITATION & CURRETTAGE/HYSTROSCOPY WITH NOVASURE ABLATION N/A 07/03/2017   Procedure: DILATATION & CURETTAGE/HYSTEROSCOPY WITH NOVASURE ABLATION;  Surgeon: Genia Del, MD;  Location: [facility];  Service: Gynecology;  Laterality: N/A;  . UPPER GI ENDOSCOPY    . WISDOM TOOTH EXTRACTION  2008   x4    SOCIAL HISTORY: Social History   Tobacco Use  . Smoking status: Former Smoker  Packs/day: 0.50    Years: 18.00    Pack years: 9.00    Types: Cigarettes    Start date: 10/13/1996    Quit date: 01/12/2015    Years since quitting: 4.6  . Smokeless tobacco: Never Used  Substance Use Topics  . Alcohol use: Yes    Alcohol/week: 1.0 standard drinks    Types: 1 Standard drinks or equivalent per week    Comment: rarely drinks  . Drug use: No    Comment: marjuana - former use, quit 4 years ago    FAMILY HISTORY: Family History  Problem Relation Age of Onset  . Hypertension Mother   . Hyperlipidemia Mother   . Cancer Mother        bone cancer  . Heart disease Mother   . Sudden death Mother   . Stroke Mother   . Thyroid disease Mother   . Sleep apnea Mother   . Obesity Mother   . Sudden death Father   . Hypertension Father   . Heart disease Father   . Hyperlipidemia Father   . Prostate cancer Father   . Lung cancer Father   . Obesity Father   . Alcoholism Father   . Sudden death Brother   . Hypertension Brother   . Hyperlipidemia Brother   . Heart attack Brother   . Heart disease Brother   . Stroke Brother   . Alcoholism Brother   . Drug abuse Brother   . Heart disease Sister   . Hypertension Sister   . Thyroid cancer Sister   .  Diabetes Neg Hx   . Colon cancer Neg Hx   . Stomach cancer Neg Hx   . Esophageal cancer Neg Hx   . Rectal cancer Neg Hx   . Liver cancer Neg Hx     ROS: Review of Systems  Constitutional: Negative for weight loss.  Gastrointestinal: Negative for heartburn, nausea and vomiting.  Musculoskeletal:       Negative for muscle weakness  Endo/Heme/Allergies:       Negative for polyphagia    PHYSICAL EXAM: Blood pressure 112/71, pulse (!) 104, temperature 98.5 F (36.9 C), temperature source Oral, height 5\' 7"  (1.702 m), weight (!) 449 lb (203.7 kg), SpO2 98 %. Body mass index is 70.32 kg/m. Physical Exam Vitals signs reviewed.  Constitutional:      Appearance: Normal appearance. She is well-developed. She is obese.  Cardiovascular:     Rate and Rhythm: Normal rate.  Pulmonary:     Effort: Pulmonary effort is normal.  Musculoskeletal: Normal range of motion.  Skin:    General: Skin is warm and dry.  Neurological:     Mental Status: She is alert and oriented to person, place, and time.  Psychiatric:        Mood and Affect: Mood normal.        Behavior: Behavior normal.     RECENT LABS AND TESTS: BMET    Component Value Date/Time   NA 140 07/26/2019 0750   K 4.5 07/26/2019 0750   CL 104 07/26/2019 0750   CO2 22 07/26/2019 0750   GLUCOSE 93 07/26/2019 0750   GLUCOSE 83 06/04/2016 1155   BUN 14 07/26/2019 0750   CREATININE 0.84 07/26/2019 0750   CREATININE 0.90 06/04/2016 1155   CALCIUM 9.1 07/26/2019 0750   GFRNONAA 89 07/26/2019 0750   GFRNONAA 84 06/04/2016 1155   GFRAA 103 07/26/2019 0750   GFRAA >89 06/04/2016 1155   Lab Results  Component  Value Date   HGBA1C 5.4 07/26/2019   HGBA1C 5.5 09/28/2018   HGBA1C 5.4 05/24/2018   HGBA1C 5.3 12/10/2017   HGBA1C 5.4 06/25/2017   Lab Results  Component Value Date   INSULIN 41.7 (H) 07/26/2019   INSULIN 29.7 (H) 09/28/2018   INSULIN 28.3 (H) 05/24/2018   INSULIN 43.6 (H) 12/10/2017   INSULIN 30.4 (H) 06/25/2017    CBC    Component Value Date/Time   WBC 11.1 (H) 07/26/2019 0750   WBC 13.3 (H) 07/03/2017 0610   RBC 4.34 07/26/2019 0750   RBC 4.33 07/03/2017 0610   HGB 12.9 07/26/2019 0750   HCT 38.1 07/26/2019 0750   PLT 219 07/26/2019 0750   MCV 88 07/26/2019 0750   MCH 29.7 07/26/2019 0750   MCH 25.9 (L) 07/03/2017 0610   MCHC 33.9 07/26/2019 0750   MCHC 32.4 07/03/2017 0610   RDW 12.9 07/26/2019 0750   LYMPHSABS 4.8 (H) 07/26/2019 0750   MONOABS 0.6 07/03/2017 0610   EOSABS 0.0 07/26/2019 0750   BASOSABS 0.0 07/26/2019 0750   Iron/TIBC/Ferritin/ %Sat    Component Value Date/Time   IRON 25 (L) 05/18/2017 1611   TIBC 363 05/18/2017 1611   FERRITIN 16 05/18/2017 1611   IRONPCTSAT 7 (LL) 05/18/2017 1611   Lipid Panel     Component Value Date/Time   CHOL 146 07/26/2019 0750   TRIG 45 07/26/2019 0750   HDL 46 07/26/2019 0750   CHOLHDL 3.6 11/21/2015 1047   VLDL 10 11/21/2015 1047   LDLCALC 90 07/26/2019 0750   Hepatic Function Panel     Component Value Date/Time   PROT 6.8 07/26/2019 0750   ALBUMIN 3.8 07/26/2019 0750   AST 25 07/26/2019 0750   ALT 25 07/26/2019 0750   ALKPHOS 68 07/26/2019 0750   BILITOT 0.4 07/26/2019 0750      Component Value Date/Time   TSH 2.160 07/26/2019 0750   TSH 1.43 05/19/2017 1245   TSH 1.460 10/27/2016 1058     Ref. Range 07/26/2019 07:50  Vitamin D, 25-Hydroxy Latest Ref Range: 30.0 - 100.0 ng/mL 34.3    OBESITY BEHAVIORAL INTERVENTION VISIT  Today's visit was # 56  Starting weight: 468 lbs Starting date: 10/27/2016 Today's weight : 449 lbs Today's date: 09/01/2019 Total lbs lost to date: 19    09/01/2019  Height 5\' 7"  (1.702 m)  Weight 449 lb (203.7 kg) (A)  BMI (Calculated) 70.31  BLOOD PRESSURE - SYSTOLIC 497  BLOOD PRESSURE - DIASTOLIC 71   Body Fat % 02.6 %    ASK: We discussed the diagnosis of obesity with Debbrah Alar today and Conner agreed to give [name] permission to discuss obesity behavioral modification  therapy today.  ASSESS: Leonard has the diagnosis of obesity and her BMI today is 70.31 Jayne is in the action stage of change   ADVISE: Nargis was educated on the multiple health risks of obesity as well as the benefit of weight loss to improve her health. She was advised of the need for long term treatment and the importance of lifestyle modifications to improve her current health and to decrease her risk of future health problems.  AGREE: Multiple dietary modification options and treatment options were discussed and  Elfriede agreed to follow the recommendations documented in the above note.  ARRANGE: Amarianna was educated on the importance of frequent visits to treat obesity as outlined per CMS and USPSTF guidelines and agreed to schedule her next follow up appointment today.  Corey Skains, am  acting as Energy managertranscriptionist for [place], FNP-C  I have reviewed the above documentation for accuracy and completeness, and I agree with the above.  - Dawn Whitmire, FNP-C.

## 2019-09-06 MED FILL — VIT D2 1.25 MG (50,000 UNIT: 1.25 MG | 28 days supply | Qty: 4 | Fill #0

## 2019-09-10 ENCOUNTER — Encounter (INDEPENDENT_AMBULATORY_CARE_PROVIDER_SITE_OTHER): Payer: Self-pay | Admitting: Family Medicine

## 2019-09-26 ENCOUNTER — Ambulatory Visit (INDEPENDENT_AMBULATORY_CARE_PROVIDER_SITE_OTHER): Payer: Self-pay | Admitting: Family Medicine

## 2019-10-18 ENCOUNTER — Telehealth (INDEPENDENT_AMBULATORY_CARE_PROVIDER_SITE_OTHER): Payer: 59 | Admitting: Family Medicine

## 2019-10-18 ENCOUNTER — Encounter (INDEPENDENT_AMBULATORY_CARE_PROVIDER_SITE_OTHER): Payer: Self-pay

## 2019-10-18 ENCOUNTER — Other Ambulatory Visit: Payer: Self-pay

## 2019-10-18 DIAGNOSIS — E8881 Metabolic syndrome: Secondary | ICD-10-CM | POA: Diagnosis not present

## 2019-10-18 DIAGNOSIS — E559 Vitamin D deficiency, unspecified: Secondary | ICD-10-CM

## 2019-10-18 DIAGNOSIS — Z6841 Body Mass Index (BMI) 40.0 and over, adult: Secondary | ICD-10-CM | POA: Diagnosis not present

## 2019-10-18 MED ORDER — SAXENDA 18 MG/3ML ~~LOC~~ SOPN: 3.0000 mg | PEN_INJECTOR | Freq: Every day | SUBCUTANEOUS | 0 refills | Status: DC

## 2019-10-18 MED ORDER — VITAMIN D (ERGOCALCIFEROL) 1.25 MG (50000 UNIT) PO CAPS: 50000.0000 [IU] | ORAL_CAPSULE | ORAL | 0 refills | Status: DC

## 2019-10-18 MED FILL — VIT D2 1.25 MG (50,000 UNIT: 1.25 MG | 28 days supply | Qty: 4 | Fill #0

## 2019-10-19 NOTE — Progress Notes (Signed)
Office: [phone]  /  Fax: [phone] TeleHealth Visit:  Catherine [name] has verbally consented to this TeleHealth visit today. The patient is located at home, the provider is located at the [employer] [facility] office. The participants in this visit include the listed provider and patient and any and all parties involved. The visit was conducted today via WebEx.  HPI:  Chief Complaint: OBESITY Catherine [name] is here to discuss her progress with her obesity treatment plan. She is on the Category 4 Plan and states she is following her eating plan approximately 80 % of the time. She states she is exercising 0 minutes 0 times per week.  Catherine [name] did indulge some over the holidays, but she feels she is now back on the plan. Catherine [name] is eating all of the protein on the plan. She denies excessive simple carbohydrates over the holidays. She is on Saxenda 2.4 mg daily (weight not reported). Saxenda at 3 mg causes her to have diarrhea.  Vitamin D deficiency Catherine [name] has a diagnosis of vitamin D deficiency. Her last vitamin D level was 34.3 on 07/26/19 and was not at goal. Catherine [name] is on prescription vitamin D.  Insulin Resistance Catherine [name] has a diagnosis of insulin resistance. Her last A1c was at 5.4 and fasting insulin was at 41.7 on 07/26/19. She denies polyphagia.  ASSESSMENT AND PLAN:  Vitamin D deficiency - Plan: Vitamin D, Ergocalciferol, (DRISDOL) 1.25 MG (50000 UT) CAPS capsule  Insulin resistance  Class 3 severe obesity with serious comorbidity and body mass index (BMI) greater than or equal to 70 in adult, unspecified obesity type (Catherine [name]) - Plan: Liraglutide -Weight Management (SAXENDA) 18 MG/3ML SOPN  PLAN:  Vitamin D deficiency Low Vitamin D level contributes to fatigue and are associated with obesity, breast, and colon cancer. Delainey agrees to continue to take prescription Vitamin D @50 ,000 IU every week #4 with no refills and she will follow-up for routine testing of vitamin D, at  least 2-3 times per year to avoid over-replacement. We will check vitamin D level at the next visit and Catherine [name] agrees to follow up as directed.  Insulin Resistance Catherine [name] will continue with the meal plan. She will continue to work on weight loss, exercise, and decreasing simple carbohydrates to help decrease the risk of diabetes. Kerrilynn agreed to follow-up with [name] as directed to closely monitor her progress.  Obesity Catherine [name] is currently in the action stage of change. As such, her goal is to continue with weight loss efforts. She has agreed to Category 4 Plan. Catherine [name] has been instructed to work up to a goal of 150 minutes of combined cardio and strengthening exercise per week for weight loss and overall health benefits. We discussed the following Behavioral Modification Strategies today: increasing lean protein intake, decreasing simple carbohydrates and planning for success. Catherine [name] will continue Saxenda 2.4 mg daily, and a refill was written for Saxenda 3.0 mg sub Q daily #5 pens with no refills.  Catherine [name] has agreed to follow-up with our clinic in 2 weeks. She was informed of the importance of frequent follow-up visits to maximize her success with intensive lifestyle modifications for her multiple health conditions.  ALLERGIES: Allergies  Allergen Reactions  . Citrus Swelling    MEDICATIONS: Current Outpatient Medications on File Prior to Visit  Medication Sig Dispense Refill  . aspirin-acetaminophen-caffeine (EXCEDRIN MIGRAINE) 250-250-65 MG per tablet Take 2 tablets by mouth 2 (two) times daily as needed for migraine. For headache     . cetirizine (ZYRTEC) 10 MG tablet  Take 10 mg by mouth daily.    . cyanocobalamin 100 MCG tablet Take 100 mcg by mouth daily.    . diclofenac (CATAFLAM) 50 MG tablet Take 50 mg by mouth 2 (two) times daily at 10 AM and 5 PM.    . famotidine (PEPCID) 10 MG tablet Take 1 tablet (10 mg total) by mouth daily. 30 tablet 6  . fluticasone (FLONASE) 50  MCG/ACT nasal spray Place 2 sprays into both nostrils daily. 16 g 6  . furosemide (LASIX) 20 MG tablet TAKE 1 TABLET (20 MG TOTAL) BY MOUTH 2 TIMES DAILY. 60 tablet 2  . gabapentin (NEURONTIN) 300 MG capsule TAKE 2 CAPSULES (600 MG TOTAL) BY MOUTH 3 TIMES A DAY 180 capsule 3  . ibuprofen (ADVIL,MOTRIN) 200 MG tablet Take 600-800 mg by mouth every 8 (eight) hours as needed (for pain.).     . Insulin Pen Needle (BD PEN NEEDLE NANO U/F) 32G X 4 MM MISC 1 Package by Does not apply route daily at 12 noon. 100 each 0  . pantoprazole (PROTONIX) 40 MG tablet Take 1 tablet (40 mg total) by mouth daily. Take 30 minutes prior to breakfast 30 tablet 3   No current facility-administered medications on file prior to visit.    PAST MEDICAL HISTORY: Past Medical History:  Diagnosis Date  . Allergy   . Anxiety   . Arthritis   . Asthma    since baby, seasonal  . ASTHMA, UNSPECIFIED 12/10/2006   Qualifier: Diagnosis of  By: Abundio Miu    . CELLULITIS AND ABSCESS OF LEG EXCEPT FOOT 12/30/2007   Qualifier: Diagnosis of  By: Daphine Deutscher FNP, Zena Amos    . Cyst of bone    right side of spine  . DEPENDENT EDEMA, LEGS, BILATERAL 11/22/2008   Qualifier: Diagnosis of  By: Barbaraann Barthel MD, [place]    . Depression 2003Dx  . Edema   . Food allergy    citris  . FOOT PAIN, RIGHT 11/22/2008   Qualifier: Diagnosis of  By: Barbaraann Barthel MD, [place]    . Gastric ulcer   . GERD (gastroesophageal reflux disease)   . Gout   . Hypertension    took Norvasc for 3 months, doctor discontinued no longer on BP medications  . Joint pain   . KNEE INJURY, LEFT 01/01/2009   Qualifier: Diagnosis of  By: Delrae Alfred MD, Lanora Manis    . Migraine   . ONYCHOMYCOSIS, TOENAILS 11/22/2008   Qualifier: Diagnosis of  By: Barbaraann Barthel MD, [place]    . Shortness of breath   . Sleep apnea   . TINEA PEDIS 11/22/2008   Qualifier: Diagnosis of  By: Barbaraann Barthel MD, Benetta Spar      PAST SURGICAL HISTORY: Past Surgical History:  Procedure Laterality Date  .  DILITATION & CURRETTAGE/HYSTROSCOPY WITH NOVASURE ABLATION N/A 07/03/2017   Procedure: DILATATION & CURETTAGE/HYSTEROSCOPY WITH NOVASURE ABLATION;  Surgeon: Genia Del, MD;  Location: [facility];  Service: Gynecology;  Laterality: N/A;  . UPPER GI ENDOSCOPY    . WISDOM TOOTH EXTRACTION  2008   x4    SOCIAL HISTORY: Social History   Tobacco Use  . Smoking status: Former Smoker    Packs/day: 0.50    Years: 18.00    Pack years: 9.00    Types: Cigarettes    Start date: 10/13/1996    Quit date: 01/12/2015    Years since quitting: 4.7  . Smokeless tobacco: Never Used  Substance Use Topics  . Alcohol use: Yes    Alcohol/week: 1.0 standard  drinks    Types: 1 Standard drinks or equivalent per week    Comment: rarely drinks  . Drug use: No    Comment: marjuana - former use, quit 4 years ago    FAMILY HISTORY: Family History  Problem Relation Age of Onset  . Hypertension Mother   . Hyperlipidemia Mother   . Cancer Mother        bone cancer  . Heart disease Mother   . Sudden death Mother   . Stroke Mother   . Thyroid disease Mother   . Sleep apnea Mother   . Obesity Mother   . Sudden death Father   . Hypertension Father   . Heart disease Father   . Hyperlipidemia Father   . Prostate cancer Father   . Lung cancer Father   . Obesity Father   . Alcoholism Father   . Sudden death Brother   . Hypertension Brother   . Hyperlipidemia Brother   . Heart attack Brother   . Heart disease Brother   . Stroke Brother   . Alcoholism Brother   . Drug abuse Brother   . Heart disease Sister   . Hypertension Sister   . Thyroid cancer Sister   . Diabetes Neg Hx   . Colon cancer Neg Hx   . Stomach cancer Neg Hx   . Esophageal cancer Neg Hx   . Rectal cancer Neg Hx   . Liver cancer Neg Hx     ROS: Review of Systems  Constitutional: Negative for weight loss.  Endo/Heme/Allergies:       Negative for polyphagia    PHYSICAL EXAM: There were no vitals taken for this visit. There  is no height or weight on file to calculate BMI. Physical Exam Constitutional:      General: She is not in acute distress.    Appearance: She is well-developed.  Neurological:     Mental Status: She is alert and oriented to person, place, and time.  Psychiatric:        Mood and Affect: Mood normal.        Behavior: Behavior normal.     RECENT LABS AND TESTS: BMET    Component Value Date/Time   NA 140 07/26/2019 0750   K 4.5 07/26/2019 0750   CL 104 07/26/2019 0750   CO2 22 07/26/2019 0750   GLUCOSE 93 07/26/2019 0750   GLUCOSE 83 06/04/2016 1155   BUN 14 07/26/2019 0750   CREATININE 0.84 07/26/2019 0750   CREATININE 0.90 06/04/2016 1155   CALCIUM 9.1 07/26/2019 0750   GFRNONAA 89 07/26/2019 0750   GFRNONAA 84 06/04/2016 1155   GFRAA 103 07/26/2019 0750   GFRAA >89 06/04/2016 1155   Lab Results  Component Value Date   HGBA1C 5.4 07/26/2019   HGBA1C 5.5 09/28/2018   HGBA1C 5.4 05/24/2018   HGBA1C 5.3 12/10/2017   HGBA1C 5.4 06/25/2017   Lab Results  Component Value Date   INSULIN 41.7 (H) 07/26/2019   INSULIN 29.7 (H) 09/28/2018   INSULIN 28.3 (H) 05/24/2018   INSULIN 43.6 (H) 12/10/2017   INSULIN 30.4 (H) 06/25/2017   CBC    Component Value Date/Time   WBC 11.1 (H) 07/26/2019 0750   WBC 13.3 (H) 07/03/2017 0610   RBC 4.34 07/26/2019 0750   RBC 4.33 07/03/2017 0610   HGB 12.9 07/26/2019 0750   HCT 38.1 07/26/2019 0750   PLT 219 07/26/2019 0750   MCV 88 07/26/2019 0750   MCH 29.7 07/26/2019 0750  MCH 25.9 (L) 07/03/2017 0610   MCHC 33.9 07/26/2019 0750   MCHC 32.4 07/03/2017 0610   RDW 12.9 07/26/2019 0750   LYMPHSABS 4.8 (H) 07/26/2019 0750   MONOABS 0.6 07/03/2017 0610   EOSABS 0.0 07/26/2019 0750   BASOSABS 0.0 07/26/2019 0750   Iron/TIBC/Ferritin/ %Sat    Component Value Date/Time   IRON 25 (L) 05/18/2017 1611   TIBC 363 05/18/2017 1611   FERRITIN 16 05/18/2017 1611   IRONPCTSAT 7 (LL) 05/18/2017 1611   Lipid Panel     Component Value  Date/Time   CHOL 146 07/26/2019 0750   TRIG 45 07/26/2019 0750   HDL 46 07/26/2019 0750   CHOLHDL 3.6 11/21/2015 1047   VLDL 10 11/21/2015 1047   LDLCALC 90 07/26/2019 0750   Hepatic Function Panel     Component Value Date/Time   PROT 6.8 07/26/2019 0750   ALBUMIN 3.8 07/26/2019 0750   AST 25 07/26/2019 0750   ALT 25 07/26/2019 0750   ALKPHOS 68 07/26/2019 0750   BILITOT 0.4 07/26/2019 0750      Component Value Date/Time   TSH 2.160 07/26/2019 0750   TSH 1.43 05/19/2017 1245   TSH 1.460 10/27/2016 1058    Ref. Range 07/26/2019 07:50  Vitamin D, 25-Hydroxy Latest Ref Range: 30.0 - 100.0 ng/mL 34.3   I, [place] Crane, am acting as Energy manager for [place], FNP-C  I have reviewed the above documentation for accuracy and completeness, and I agree with the above.  - Ardian Haberland, FNP-C.

## 2019-11-01 ENCOUNTER — Ambulatory Visit (INDEPENDENT_AMBULATORY_CARE_PROVIDER_SITE_OTHER): Payer: 59 | Admitting: Family Medicine

## 2019-11-01 ENCOUNTER — Other Ambulatory Visit: Payer: Self-pay

## 2019-11-01 ENCOUNTER — Encounter (INDEPENDENT_AMBULATORY_CARE_PROVIDER_SITE_OTHER): Payer: Self-pay | Admitting: Family Medicine

## 2019-11-01 VITALS — BP 122/80 | HR 100 | Temp 97.9°F | Ht 67.0 in | Wt >= 6400 oz

## 2019-11-01 DIAGNOSIS — Z6841 Body Mass Index (BMI) 40.0 and over, adult: Secondary | ICD-10-CM

## 2019-11-01 DIAGNOSIS — F3289 Other specified depressive episodes: Secondary | ICD-10-CM

## 2019-11-01 DIAGNOSIS — Z7189 Other specified counseling: Secondary | ICD-10-CM | POA: Diagnosis not present

## 2019-11-01 DIAGNOSIS — E559 Vitamin D deficiency, unspecified: Secondary | ICD-10-CM | POA: Diagnosis not present

## 2019-11-01 MED ORDER — VITAMIN D (ERGOCALCIFEROL) 1.25 MG (50000 UNIT) PO CAPS: 50000.0000 [IU] | ORAL_CAPSULE | ORAL | 0 refills | Status: DC

## 2019-11-01 MED ORDER — BUPROPION HCL ER (SR) 150 MG PO TB12: 150.0000 mg | ORAL_TABLET | Freq: Every day | ORAL | 0 refills | Status: DC

## 2019-11-01 MED FILL — BUPROPION HCL SR 150 MG TAB: 150 | 30 days supply | Qty: 30 | Fill #0

## 2019-11-01 NOTE — Progress Notes (Signed)
Chief Complaint:   Catherine [name] is here to discuss her progress with her Catherine treatment plan along with follow-up of her Catherine related diagnoses. Catherine [name] is on the Category 4 Plan and states she is following her eating plan approximately 85% of the time. Catherine [name] states she is walking 20 to 25 minutes 3 times per week.  Today's visit was #: 59 Starting weight: 468 lbs Starting date: 10/27/2016 Today's weight: 453 lbs Today's date: 11/01/2019 Total lbs lost to date: 15 Total lbs lost since last in-office visit: 0  Interim History: Catherine [name] is unsure why she has put on weight. She feels that she may have some edema. Total body water did not register today on our scaled. She denies sugar sweetened beverages. Catherine [name] is measuring her portions. Catherine [name] reports keeping extra calories to below 300 per day. She has been on Saxenda, but her insurance is now denying coverage.  We discussed the low carb plan today and she may consider this.  Subjective:   Vitamin D deficiency Catherine [name]'s last Vitamin D level was not at goal (34.3 on 07/26/19). She is currently taking prescription vitamin D. She denies nausea, vomiting or muscle weakness.  Other depression, with emotional eating Catherine [name] admits to some food cravings. She sometimes eats when she is not hungry. Catherine [name] is struggling with emotional eating and using food for comfort to the extent that it is negatively impacting her health. She has been working on behavior modification techniques to help reduce her emotional eating and has been somewhat successful. Her mood is stable. She shows no sign of suicidal or homicidal ideations.  Counseled about COVID-19 virus infection Catherine [name] is wearing a mask, she is socially isolating and she is washing her hands frequently. She does not think that she will take the vaccine.  Assessment/Plan:   Vitamin D deficiency  Low Vitamin D level contributes to fatigue and are associated with  Catherine, breast, and colon cancer. Catherine [name] agrees to continue to take prescription Vitamin D @50 ,000 IU every week #4 with no refills and she will follow-up for routine testing of Vitamin D, at least 2-3 times per year to avoid over-replacement.  Other depression, with emotional eating   Catherine [name] agreed to start Bupropion, and a new prescription was written today for Bupropion SR 150 mg daily #30 with no refills. Orders and follow up as documented in patient record.   Counseled about COVID-19 virus infection I encouraged her to take the vaccine. She will continue all above measures. We will continue to monitor. Orders and follow up as documented in patient record.  Counseling  COVID-19 is a respiratory infection that is caused by a virus. It can cause serious infections, such as pneumonia, acute respiratory distress syndrome, acute respiratory failure, or sepsis.  You are more likely to develop a serious illness if you are 81 years of age or older, have a weak immune system, live in a nursing home, have chronic disease, or have Catherine.  Get vaccinated as soon as they are available to you.  For our most current information, please visit [email or web address].  Wash your hands often with soap and water for 20 seconds. If soap and water are not available, use alcohol-based hand sanitizer.  Wear a face mask. Make sure your mask covers your nose and mouth.  Maintain at least 6 feet distance from others when in public.     Catherine Catherine [name] is currently in the action stage of change. As such, her goal is to continue  with weight loss efforts. She has agreed to the Category 4 Plan.   Catherine [name] will continue walking for 20 to 25 minutes, 3 times per week.  Behavioral modification strategies: increasing lean protein intake, decreasing simple carbohydrates and planning for success.  Catherine [name] will consider the low carb plan.  Catherine [name] has agreed to follow-up with our clinic in 3 weeks. She was  informed of the importance of frequent follow-up visits to maximize her success with intensive lifestyle modifications for her multiple health conditions.   Objective:   Blood pressure 122/80, pulse 100, temperature 97.9 F (36.6 C), temperature source Oral, height 5\' 7"  (1.702 m), weight (!) 453 lb (205.5 kg), SpO2 98 %. Body mass index is 70.95 kg/m.  General: Cooperative, alert, well developed, in no acute distress. HEENT: Conjunctivae and lids unremarkable. Cardiovascular: Regular rhythm.  Lungs: Normal work of breathing. Neurologic: No focal deficits.   Lab Results  Component Value Date   CREATININE 0.84 07/26/2019   BUN 14 07/26/2019   NA 140 07/26/2019   K 4.5 07/26/2019   CL 104 07/26/2019   CO2 22 07/26/2019   Lab Results  Component Value Date   ALT 25 07/26/2019   AST 25 07/26/2019   ALKPHOS 68 07/26/2019   BILITOT 0.4 07/26/2019   Lab Results  Component Value Date   HGBA1C 5.4 07/26/2019   HGBA1C 5.5 09/28/2018   HGBA1C 5.4 05/24/2018   HGBA1C 5.3 12/10/2017   HGBA1C 5.4 06/25/2017   Lab Results  Component Value Date   INSULIN 41.7 (H) 07/26/2019   INSULIN 29.7 (H) 09/28/2018   INSULIN 28.3 (H) 05/24/2018   INSULIN 43.6 (H) 12/10/2017   INSULIN 30.4 (H) 06/25/2017   Lab Results  Component Value Date   TSH 2.160 07/26/2019   Lab Results  Component Value Date   CHOL 146 07/26/2019   HDL 46 07/26/2019   LDLCALC 90 07/26/2019   TRIG 45 07/26/2019   CHOLHDL 3.6 11/21/2015   Lab Results  Component Value Date   WBC 11.1 (H) 07/26/2019   HGB 12.9 07/26/2019   HCT 38.1 07/26/2019   MCV 88 07/26/2019   PLT 219 07/26/2019   Lab Results  Component Value Date   IRON 25 (L) 05/18/2017   TIBC 363 05/18/2017   FERRITIN 16 05/18/2017    Ref. Range 07/26/2019 07:50  Vitamin D, 25-Hydroxy Latest Ref Range: 30.0 - 100.0 ng/mL 34.3    Attestation Statements:   Reviewed by clinician on day of visit: allergies, medications, problem list, medical  history, surgical history, family history, social history, and previous encounter notes.  07/28/2019, am acting as Cristi Loron for Energy manager, FNP-C.  I have reviewed the above documentation for accuracy and completeness, and I agree with the above. -  Hollyanne Schloesser [place], FNP-C

## 2019-11-02 ENCOUNTER — Encounter (INDEPENDENT_AMBULATORY_CARE_PROVIDER_SITE_OTHER): Payer: Self-pay | Admitting: Family Medicine

## 2019-11-09 MED FILL — VIT D2 1.25 MG (50,000 UNIT: 1.25 MG | 28 days supply | Qty: 4 | Fill #0

## 2019-11-22 ENCOUNTER — Other Ambulatory Visit: Payer: Self-pay

## 2019-11-22 ENCOUNTER — Encounter (INDEPENDENT_AMBULATORY_CARE_PROVIDER_SITE_OTHER): Payer: Self-pay | Admitting: Family Medicine

## 2019-11-22 ENCOUNTER — Ambulatory Visit (INDEPENDENT_AMBULATORY_CARE_PROVIDER_SITE_OTHER): Payer: 59 | Admitting: Family Medicine

## 2019-11-22 VITALS — BP 130/76 | HR 95 | Temp 98.0°F | Ht 67.0 in | Wt >= 6400 oz

## 2019-11-22 DIAGNOSIS — E8881 Metabolic syndrome: Secondary | ICD-10-CM | POA: Diagnosis not present

## 2019-11-22 DIAGNOSIS — Z9189 Other specified personal risk factors, not elsewhere classified: Secondary | ICD-10-CM | POA: Diagnosis not present

## 2019-11-22 DIAGNOSIS — Z6841 Body Mass Index (BMI) 40.0 and over, adult: Secondary | ICD-10-CM

## 2019-11-22 DIAGNOSIS — E559 Vitamin D deficiency, unspecified: Secondary | ICD-10-CM

## 2019-11-22 NOTE — Progress Notes (Signed)
Chief Complaint:   OBESITY Catherine [name] is here to discuss her progress with her obesity treatment plan along with follow-up of her obesity related diagnoses. [name] is on the Category 4 Plan and states she is following her eating plan approximately 85% of the time. [name] states she is exercising 0 minutes 0 times per week.  Today's visit was #: 60 Starting weight: 468 lbs Starting date: 10/27/2017 Today's weight: 449 lbs Today's date: 11/22/2019 Total lbs lost to date: 19 Total lbs lost since last in-office visit: 4  Interim History: Catherine [name] reports doing well on the plan over the last few weeks.  She has questions about starting the low carb plan because she is interested in starting it.   Subjective:   Vitamin D deficiency  [name]'s Vitamin D level was 34.3 on 07/26/19 and was not at goal. She is currently taking prescription vit D. She denies fatigue.  Insulin resistance [name] has a diagnosis of insulin resistance based on her elevated fasting insulin level >5. She is not on metformin. [name] continues to work on diet and exercise to decrease her risk of diabetes. She denies polyphagia.  Lab Results  Component Value Date   INSULIN 41.7 (H) 07/26/2019   INSULIN 29.7 (H) 09/28/2018   INSULIN 28.3 (H) 05/24/2018   INSULIN 43.6 (H) 12/10/2017   INSULIN 30.4 (H) 06/25/2017   Lab Results  Component Value Date   HGBA1C 5.4 07/26/2019   At risk for constipation Catherine [name] is at increased risk for constipation due to change in plan to low carb. Catherine [name] denies hard, infrequent stools currently.   Assessment/Plan:   Vitamin D deficiency  Low Vitamin D level contributes to fatigue and are associated with obesity, breast, and colon cancer. [name] will continue to take prescription Vitamin D @50 ,000 IU every week and we will check vitamin D level today. She will follow-up for routine testing of Vitamin D, at least 2-3 times per year to avoid over-replacement.  Insulin  resistance  [name] will continue to work on weight loss, exercise, and decreasing simple carbohydrates to help decrease the risk of diabetes. We will check A1c and fasting insulin today. [name] agreed to follow-up with [name] as directed to closely monitor her progress.  At risk for constipation Catherine [name] was given approximately 15 minutes of counseling today regarding prevention of constipation. She was encouraged to increase water and fiber intake.   Class 3 severe obesity with serious comorbidity and body mass index (BMI) greater than or equal to 70 in adult, unspecified obesity type (HCC) Catherine [name] is currently in the action stage of change. As such, her goal is to continue with weight loss efforts. She has agreed to following a lower carbohydrate, vegetable and lean protein rich diet plan.   Exercise goals: No exercise has been prescribed at this time.  Behavioral modification strategies: watch for satiety cues, increasing lean protein intake, decreasing simple carbohydrates and increasing vegetables.  We discussed the low carb plan today, and she wishes to start. Catherine [name] may have Whisps snacks -  1 serving daily. She also have a serving of bacon or sausage at breakfast.   Catherine [name] has agreed to follow-up with our clinic in 3 weeks. She was informed of the importance of frequent follow-up visits to maximize her success with intensive lifestyle modifications for her multiple health conditions.   Catherine [name] was informed we would discuss her lab results at her next visit unless there is a critical issue that needs to be addressed sooner.  Catherine [name] agreed to keep her next visit at the agreed upon time to discuss these results.  Objective:   Blood pressure 130/76, pulse 95, temperature 98 F (36.7 C), temperature source Oral, height 5\' 7"  (1.702 m), weight (!) 449 lb (203.7 kg), SpO2 95 %. Body mass index is 70.32 kg/m.  General: Cooperative, alert, well developed, in no acute distress. HEENT:  Conjunctivae and lids unremarkable. Cardiovascular: Regular rhythm.  Lungs: Normal work of breathing. Neurologic: No focal deficits.   Lab Results  Component Value Date   CREATININE 0.84 07/26/2019   BUN 14 07/26/2019   NA 140 07/26/2019   K 4.5 07/26/2019   CL 104 07/26/2019   CO2 22 07/26/2019   Lab Results  Component Value Date   ALT 25 07/26/2019   AST 25 07/26/2019   ALKPHOS 68 07/26/2019   BILITOT 0.4 07/26/2019   Lab Results  Component Value Date   HGBA1C 5.4 07/26/2019   HGBA1C 5.5 09/28/2018   HGBA1C 5.4 05/24/2018   HGBA1C 5.3 12/10/2017   HGBA1C 5.4 06/25/2017   Lab Results  Component Value Date   INSULIN 41.7 (H) 07/26/2019   INSULIN 29.7 (H) 09/28/2018   INSULIN 28.3 (H) 05/24/2018   INSULIN 43.6 (H) 12/10/2017   INSULIN 30.4 (H) 06/25/2017   Lab Results  Component Value Date   TSH 2.160 07/26/2019   Lab Results  Component Value Date   CHOL 146 07/26/2019   HDL 46 07/26/2019   LDLCALC 90 07/26/2019   TRIG 45 07/26/2019   CHOLHDL 3.6 11/21/2015   Lab Results  Component Value Date   WBC 11.1 (H) 07/26/2019   HGB 12.9 07/26/2019   HCT 38.1 07/26/2019   MCV 88 07/26/2019   PLT 219 07/26/2019   Lab Results  Component Value Date   IRON 25 (L) 05/18/2017   TIBC 363 05/18/2017   FERRITIN 16 05/18/2017    Ref. Range 07/26/2019 07:50  Vitamin D, 25-Hydroxy Latest Ref Range: 30.0 - 100.0 ng/mL 34.3    Attestation Statements:   Reviewed by clinician on day of visit: allergies, medications, problem list, medical history, surgical history, family history, social history, and previous encounter notes.  Corey Skains, am acting as Location manager for [employer], FNP-C.  I have reviewed the above documentation for accuracy and completeness, and I agree with the above. -  Jaylen Knope [employer], FNP-C

## 2019-11-23 LAB — HEMOGLOBIN A1C
Est. average glucose Bld gHb Est-mCnc: 111 mg/dL
Hgb A1c MFr Bld: 5.5 % (ref 4.8–5.6)

## 2019-11-23 LAB — VITAMIN D 25 HYDROXY (VIT D DEFICIENCY, FRACTURES): Vit D, 25-Hydroxy: 43.4 ng/mL (ref 30.0–100.0)

## 2019-11-23 LAB — INSULIN, RANDOM: INSULIN: 32.5 u[IU]/mL — ABNORMAL HIGH (ref 2.6–24.9)

## 2019-12-12 ENCOUNTER — Ambulatory Visit (INDEPENDENT_AMBULATORY_CARE_PROVIDER_SITE_OTHER): Payer: 59 | Admitting: Family Medicine

## 2019-12-12 ENCOUNTER — Other Ambulatory Visit: Payer: Self-pay

## 2019-12-12 ENCOUNTER — Encounter (INDEPENDENT_AMBULATORY_CARE_PROVIDER_SITE_OTHER): Payer: Self-pay | Admitting: Family Medicine

## 2019-12-12 VITALS — BP 102/57 | HR 94 | Temp 98.5°F | Ht 67.0 in | Wt >= 6400 oz

## 2019-12-12 DIAGNOSIS — Z9189 Other specified personal risk factors, not elsewhere classified: Secondary | ICD-10-CM

## 2019-12-12 DIAGNOSIS — F3289 Other specified depressive episodes: Secondary | ICD-10-CM | POA: Diagnosis not present

## 2019-12-12 DIAGNOSIS — E559 Vitamin D deficiency, unspecified: Secondary | ICD-10-CM

## 2019-12-12 DIAGNOSIS — Z6841 Body Mass Index (BMI) 40.0 and over, adult: Secondary | ICD-10-CM

## 2019-12-12 MED ORDER — VITAMIN D (ERGOCALCIFEROL) 1.25 MG (50000 UNIT) PO CAPS: 50000.0000 [IU] | ORAL_CAPSULE | ORAL | 0 refills | Status: DC

## 2019-12-12 MED ORDER — BUPROPION HCL ER (SR) 150 MG PO TB12: 150.0000 mg | ORAL_TABLET | Freq: Every day | ORAL | 0 refills | Status: DC

## 2019-12-12 MED FILL — BUPROPION HCL SR 150 MG TAB: 150 | 30 days supply | Qty: 30 | Fill #0

## 2019-12-12 MED FILL — VIT D2 1.25 MG (50,000 UNIT: 1.25 MG | 28 days supply | Qty: 4 | Fill #0

## 2019-12-12 NOTE — Progress Notes (Signed)
Chief Complaint:   OBESITY Catherine [name] is here to discuss her progress with her obesity treatment plan along with follow-up of her obesity related diagnoses. Catherine [name] is on following a lower carbohydrate, vegetable and lean protein rich diet plan and states she is following her eating plan approximately 90% of the time. Catherine [name] states she is doing 0 minutes 0 times per week.  Today's visit was #: 60 Starting weight: 468 lbs Starting date: 10/27/2017 Today's weight: 445 lbs Today's date: 12/12/2019 Total lbs lost to date: 23 Total lbs lost since last in-office visit: 4  Interim History: Catherine [name] was switched to the Low carbohydrate plan at her last visit. She reports likes the Low carbohydrate plan.  She reports good water intake. She denies polyphagia and constipation.  She had been on Saxenda but this was recently denied by her insurance so it has been discontinued.  Subjective:   1. Vitamin D deficiency Catherine [name] denies fatigue. Her Vit D level has increased but is not at goal.  2. Other depression, with emotional eating Catherine [name] denies stress eating at this time.  3. At risk for osteoporosis Catherine [name] is at higher risk of osteopenia and osteoporosis due to Vitamin D deficiency.   Assessment/Plan:   1. Vitamin D deficiency Low Vitamin D level contributes to fatigue and are associated with obesity, breast, and colon cancer. We will refill prescription Vitamin D for 1 month. Catherine [name] will follow-up for routine testing of Vitamin D, at least 2-3 times per year to avoid over-replacement.  - Vitamin D, Ergocalciferol, (DRISDOL) 1.25 MG (50000 UNIT) CAPS capsule; Take 1 capsule (50,000 Units total) by mouth every 7 (seven) days.  Dispense: 4 capsule; Refill: 0  2. Other depression, with emotional eating Behavior modification techniques were discussed today to help Catherine [name] deal with her emotional/non-hunger eating behaviors. We will refill bupropion for 1 month. Orders and follow up as  documented in patient record.   - buPROPion (WELLBUTRIN SR) 150 MG 12 hr tablet; Take 1 tablet (150 mg total) by mouth daily.  Dispense: 30 tablet; Refill: 0  3. At risk for osteoporosis Catherine [name] was given approximately 15 minutes of osteoporosis prevention counseling today. Catherine [name] is at risk for osteopenia and osteoporosis due to her Vitamin D deficiency. She was encouraged to take her Vitamin D and follow her higher calcium diet and increase strengthening exercise to help strengthen her bones and decrease her risk of osteopenia and osteoporosis.  Repetitive spaced learning was employed today to elicit superior memory formation and behavioral change.  4. Class 3 severe obesity with serious comorbidity and body mass index (BMI) of 60.0 to 69.9 in adult, unspecified obesity type (HCC) Catherine [name] is currently in the action stage of change. As such, her goal is to continue with weight loss efforts. She has agreed to following a lower carbohydrate, vegetable and lean protein rich diet plan.   Recipe handouts were given today.  Exercise goals: Catherine [name] is to start with some exercise.  Behavioral modification strategies: increasing lean protein intake, decreasing simple carbohydrates and planning for success.  Catherine [name] has agreed to follow-up with our clinic in 3 weeks. She was informed of the importance of frequent follow-up visits to maximize her success with intensive lifestyle modifications for her multiple health conditions.   Objective:   Blood pressure (!) 102/57, pulse 94, temperature 98.5 F (36.9 C), temperature source Oral, height 5\' 7"  (1.702 m), weight (!) 445 lb (201.9 kg), SpO2 96 %. Body mass index is 69.7 kg/m.  General: Cooperative,  alert, well developed, in no acute distress. HEENT: Conjunctivae and lids unremarkable. Cardiovascular: Regular rhythm.  Lungs: Normal work of breathing. Neurologic: No focal deficits.   Lab Results  Component Value Date   CREATININE 0.84  07/26/2019   BUN 14 07/26/2019   NA 140 07/26/2019   K 4.5 07/26/2019   CL 104 07/26/2019   CO2 22 07/26/2019   Lab Results  Component Value Date   ALT 25 07/26/2019   AST 25 07/26/2019   ALKPHOS 68 07/26/2019   BILITOT 0.4 07/26/2019   Lab Results  Component Value Date   HGBA1C 5.5 11/22/2019   HGBA1C 5.4 07/26/2019   HGBA1C 5.5 09/28/2018   HGBA1C 5.4 05/24/2018   HGBA1C 5.3 12/10/2017   Lab Results  Component Value Date   INSULIN 32.5 (H) 11/22/2019   INSULIN 41.7 (H) 07/26/2019   INSULIN 29.7 (H) 09/28/2018   INSULIN 28.3 (H) 05/24/2018   INSULIN 43.6 (H) 12/10/2017   Lab Results  Component Value Date   TSH 2.160 07/26/2019   Lab Results  Component Value Date   CHOL 146 07/26/2019   HDL 46 07/26/2019   LDLCALC 90 07/26/2019   TRIG 45 07/26/2019   CHOLHDL 3.6 11/21/2015   Lab Results  Component Value Date   WBC 11.1 (H) 07/26/2019   HGB 12.9 07/26/2019   HCT 38.1 07/26/2019   MCV 88 07/26/2019   PLT 219 07/26/2019   Lab Results  Component Value Date   IRON 25 (L) 05/18/2017   TIBC 363 05/18/2017   FERRITIN 16 05/18/2017   Attestation Statements:   Reviewed by clinician on day of visit: allergies, medications, problem list, medical history, surgical history, family history, social history, and previous encounter notes.   Trude Mcburney, am acting as Energy manager for [place], FNP-C.  I have reviewed the above documentation for accuracy and completeness, and I agree with the above. -  Jesse Sans, FNP

## 2020-01-02 ENCOUNTER — Ambulatory Visit (INDEPENDENT_AMBULATORY_CARE_PROVIDER_SITE_OTHER): Payer: 59 | Admitting: Family Medicine

## 2020-01-02 ENCOUNTER — Encounter (INDEPENDENT_AMBULATORY_CARE_PROVIDER_SITE_OTHER): Payer: Self-pay | Admitting: Family Medicine

## 2020-01-02 ENCOUNTER — Other Ambulatory Visit: Payer: Self-pay

## 2020-01-02 VITALS — BP 131/79 | HR 93 | Temp 98.3°F | Ht 67.0 in | Wt >= 6400 oz

## 2020-01-02 DIAGNOSIS — Z9189 Other specified personal risk factors, not elsewhere classified: Secondary | ICD-10-CM

## 2020-01-02 DIAGNOSIS — E8881 Metabolic syndrome: Secondary | ICD-10-CM | POA: Diagnosis not present

## 2020-01-02 DIAGNOSIS — E559 Vitamin D deficiency, unspecified: Secondary | ICD-10-CM | POA: Diagnosis not present

## 2020-01-02 DIAGNOSIS — F3289 Other specified depressive episodes: Secondary | ICD-10-CM

## 2020-01-02 DIAGNOSIS — Z6841 Body Mass Index (BMI) 40.0 and over, adult: Secondary | ICD-10-CM

## 2020-01-02 MED ORDER — VITAMIN D (ERGOCALCIFEROL) 1.25 MG (50000 UNIT) PO CAPS: 50000.0000 [IU] | ORAL_CAPSULE | ORAL | 0 refills | Status: DC

## 2020-01-02 MED FILL — VIT D2 1.25 MG (50,000 UNIT: 1.25 MG | 28 days supply | Qty: 4 | Fill #0

## 2020-01-02 NOTE — Progress Notes (Signed)
Chief Complaint:   OBESITY Catherine [name] is here to discuss her progress with her obesity treatment plan along with follow-up of her obesity related diagnoses. Catherine [name] is on following a lower carbohydrate, vegetable and lean protein rich diet plan and states she is following her eating plan approximately 85% of the time. Catherine [name] states she is walking for 30 minutes 3 times per week.  Today's visit was #: 62 Starting weight: 468 lbs Starting date: 10/27/2017 Today's weight: 454 lbs Today's date: 01/02/2020 Total lbs lost to date: 14 Total lbs lost since last in-office visit: 0  Interim History: Catherine [name] feels she is swollen today. She is unsure why she has gained 9 lbs.  She denies chest pain or shortness of breath.  She feels her portions are average size.   She denies excessive hunger or snacking. She drinks water only.  Subjective:   1. Vitamin D deficiency Catherine [name]'s Vit D level is not at goal. Last Vit D level was 43.4 on 11/22/2019. She is on prescription Vit D.  2. Insulin resistance Catherine [name] denies excessive hunger. She had previously been on Saxenda but this was denied coverage by her insurance.  3. Other depression, with emotional eating Catherine [name] denies stress eating at this time. She is on bupropion.  4. At risk for diabetes mellitus Catherine [name] is at higher than average risk for developing diabetes due to her obesity.   Assessment/Plan:   1. Vitamin D deficiency Low Vitamin D level contributes to fatigue and are associated with obesity, breast, and colon cancer. We will refill prescription Vitamin D for 1 month. Tomeko will follow-up for routine testing of Vitamin D, at least 2-3 times per year to avoid over-replacement.  - Vitamin D, Ergocalciferol, (DRISDOL) 1.25 MG (50000 UNIT) CAPS capsule; Take 1 capsule (50,000 Units total) by mouth every 7 (seven) days.  Dispense: 4 capsule; Refill: 0  2. Insulin resistance Octa will continue her meal plan, and will continue to work  on weight loss, exercise, and decreasing simple carbohydrates to help decrease the risk of diabetes. Alawna agreed to follow-up with [name] as directed to closely monitor her progress.  3. Other depression, with emotional eating Behavior modification techniques were discussed today to help Nikhita deal with her emotional/non-hunger eating behaviors. Iness agreed to continue bupropion. Orders and follow up as documented in patient record.   4. At risk for diabetes mellitus Catherine [name] was given approximately 15 minutes of diabetes education and counseling today. We discussed intensive lifestyle modifications today with an emphasis on weight loss as well as increasing exercise and decreasing simple carbohydrates in her diet. We also reviewed medication options with an emphasis on risk versus benefit of those discussed.   Repetitive spaced learning was employed today to elicit superior memory formation and behavioral change.  5. Class 3 severe obesity with serious comorbidity and body mass index (BMI) greater than or equal to 70 in adult, unspecified obesity type (HCC) Sharmeka is currently in the action stage of change. As such, her goal is to continue with weight loss efforts. She has agreed to following a lower carbohydrate, vegetable and lean protein rich diet plan.   Catherine [name] was advised to watch her portion size.  Exercise goals: As is.  Behavioral modification strategies: decreasing simple carbohydrates.  Catherine [name] has agreed to follow-up with our clinic in 3 weeks. She was informed of the importance of frequent follow-up visits to maximize her success with intensive lifestyle modifications for her multiple health conditions.   Objective:   Blood  pressure 131/79, pulse 93, temperature 98.3 F (36.8 C), temperature source Oral, height 5\' 7"  (1.702 m), weight (!) 454 lb (205.9 kg), SpO2 98 %. Body mass index is 71.11 kg/m.  General: Cooperative, alert, well developed, in no acute  distress. HEENT: Conjunctivae and lids unremarkable. Cardiovascular: Regular rhythm.  Lungs: Normal work of breathing. Neurologic: No focal deficits.   Lab Results  Component Value Date   CREATININE 0.84 07/26/2019   BUN 14 07/26/2019   NA 140 07/26/2019   K 4.5 07/26/2019   CL 104 07/26/2019   CO2 22 07/26/2019   Lab Results  Component Value Date   ALT 25 07/26/2019   AST 25 07/26/2019   ALKPHOS 68 07/26/2019   BILITOT 0.4 07/26/2019   Lab Results  Component Value Date   HGBA1C 5.5 11/22/2019   HGBA1C 5.4 07/26/2019   HGBA1C 5.5 09/28/2018   HGBA1C 5.4 05/24/2018   HGBA1C 5.3 12/10/2017   Lab Results  Component Value Date   INSULIN 32.5 (H) 11/22/2019   INSULIN 41.7 (H) 07/26/2019   INSULIN 29.7 (H) 09/28/2018   INSULIN 28.3 (H) 05/24/2018   INSULIN 43.6 (H) 12/10/2017   Lab Results  Component Value Date   TSH 2.160 07/26/2019   Lab Results  Component Value Date   CHOL 146 07/26/2019   HDL 46 07/26/2019   LDLCALC 90 07/26/2019   TRIG 45 07/26/2019   CHOLHDL 3.6 11/21/2015   Lab Results  Component Value Date   WBC 11.1 (H) 07/26/2019   HGB 12.9 07/26/2019   HCT 38.1 07/26/2019   MCV 88 07/26/2019   PLT 219 07/26/2019   Lab Results  Component Value Date   IRON 25 (L) 05/18/2017   TIBC 363 05/18/2017   FERRITIN 16 05/18/2017   Attestation Statements:   Reviewed by clinician on day of visit: allergies, medications, problem list, medical history, surgical history, family history, social history, and previous encounter notes.   Wilhemena Durie, am acting as Location manager for [employer], FNP-C.  I have reviewed the above documentation for accuracy and completeness, and I agree with the above. -  Georgianne Fick, FNP

## 2020-01-03 MED FILL — PANTOPRAZOLE SOD DR 40 MG T: 40 | 30 days supply | Qty: 30 | Fill #1

## 2020-01-23 ENCOUNTER — Encounter (INDEPENDENT_AMBULATORY_CARE_PROVIDER_SITE_OTHER): Payer: Self-pay | Admitting: Family Medicine

## 2020-01-23 ENCOUNTER — Other Ambulatory Visit: Payer: Self-pay

## 2020-01-23 ENCOUNTER — Ambulatory Visit (INDEPENDENT_AMBULATORY_CARE_PROVIDER_SITE_OTHER): Payer: 59 | Admitting: Family Medicine

## 2020-01-23 VITALS — BP 113/58 | HR 99 | Temp 98.0°F | Ht 67.0 in | Wt >= 6400 oz

## 2020-01-23 DIAGNOSIS — F3289 Other specified depressive episodes: Secondary | ICD-10-CM

## 2020-01-23 DIAGNOSIS — Z6841 Body Mass Index (BMI) 40.0 and over, adult: Secondary | ICD-10-CM

## 2020-01-23 DIAGNOSIS — R351 Nocturia: Secondary | ICD-10-CM | POA: Diagnosis not present

## 2020-01-23 DIAGNOSIS — Z9189 Other specified personal risk factors, not elsewhere classified: Secondary | ICD-10-CM

## 2020-01-23 DIAGNOSIS — E559 Vitamin D deficiency, unspecified: Secondary | ICD-10-CM | POA: Diagnosis not present

## 2020-01-23 MED ORDER — BUPROPION HCL ER (SR) 150 MG PO TB12: 150.0000 mg | ORAL_TABLET | Freq: Two times a day (BID) | ORAL | 0 refills | Status: DC

## 2020-01-23 MED ORDER — VITAMIN D (ERGOCALCIFEROL) 1.25 MG (50000 UNIT) PO CAPS: 50000.0000 [IU] | ORAL_CAPSULE | ORAL | 0 refills | Status: DC

## 2020-01-23 MED FILL — BUPROPION HCL SR 150 MG TAB: 150 | 30 days supply | Qty: 60 | Fill #0

## 2020-01-23 MED FILL — VIT D2 1.25 MG (50,000 UNIT: 1.25 MG | 28 days supply | Qty: 4 | Fill #0

## 2020-01-23 NOTE — Progress Notes (Signed)
Chief Complaint:   Catherine [name] is here to discuss her progress with her Catherine treatment plan along with follow-up of her Catherine related diagnoses. Catherine [name] is on following a lower carbohydrate, vegetable and lean protein rich diet plan and states she is following her eating plan approximately 90% of the time. Catherine [name] states she is walking 25-30 minutes 3 times per week.  Today's visit was #: 63 Starting weight: 468 lbs Starting date: 10/27/2016 Today's weight: 452 lbs Today's date: 01/23/2020 Total lbs lost to date: 16 Total lbs lost since last in-office visit: 2  Interim History: Catherine [name] feels that the low carb plan is helping her to stay on track. She is hydrating with plenty of water and eating plenty of vegetables. She will start traveling on the weekends with her son's sports team. She denies constipation.  Subjective:   Other depression, with emotional eating. Catherine [name] is struggling with emotional eating and using food for comfort to the extent that it is negatively impacting her health. She has been working on behavior modification techniques to help reduce her emotional eating and has been somewhat successful. She shows no sign of suicidal or homicidal ideations. She feels bupropion helps with her appetite/cravings.  Vitamin D deficiency. Last Vitamin D level was nearly at goal - 43.4 on 11/22/2019. Catherine [name] is on prescription Vitamin D.  Nocturia. Catherine [name] reports drinking 115-120 oz of water daily. She is getting up several times during the night to urinate.  At risk for side effect of medication. Catherine [name] is at risk of side effects from increased bupropion dose (insomnia).  Assessment/Plan:   Other depression, with emotional eating. Behavior modification techniques were discussed today to help [name] deal with her emotional/non-hunger eating behaviors.  Orders and follow up as documented in patient record. Catherine [name] will increase her dose of buPROPion  (WELLBUTRIN SR) to 150 MG 12 hr tablet BID #60 with 0 refills.  Vitamin D deficiency. Low Vitamin D level contributes to fatigue and are associated with Catherine, breast, and colon cancer. She was given a refill on her Vitamin D, Ergocalciferol, (DRISDOL) 1.25 MG (50000 UNIT) CAPS capsule every week #4 with 0 refills and will follow-up for routine testing of Vitamin D, at least 2-3 times per year to avoid over-replacement.     Nocturia. Catherine [name] will limit her water to < 80 oz daily and will limit drinking later in the day.  At risk for side effect of medication. Catherine [name] was given approximately 15 minutes of drug side effect counseling today.  We discussed side effect possibility and risk versus benefits. Catherine [name] agreed to the medication and will contact this office if these side effects are intolerable.  Repetitive spaced learning was employed today to elicit superior memory formation and behavioral change.  Class 3 severe Catherine with serious comorbidity and body mass index (BMI) greater than or equal to 70 in adult, unspecified Catherine type (HCC).  Catherine [name] is currently in the action stage of change. As such, her goal is to continue with weight loss efforts. She has agreed to following a lower carbohydrate, vegetable and lean protein rich diet plan.   Exercise goals: Catherine [name] will increase her walking to 30 minutes 5 days per week.  Behavioral modification strategies: meal planning and cooking strategies, travel eating strategies and planning for success.  Catherine [name] has agreed to follow-up with our clinic in 3 weeks. She was informed of the importance of frequent follow-up visits to maximize her success with intensive lifestyle modifications for her multiple  health conditions.   Objective:   Blood pressure (!) 113/58, pulse 99, temperature 98 F (36.7 C), temperature source Oral, height 5\' 7"  (1.702 m), weight (!) 452 lb (205 kg), SpO2 99 %. Body mass index is 70.79 kg/m.  General:  Cooperative, alert, well developed, in no acute distress. HEENT: Conjunctivae and lids unremarkable. Cardiovascular: Regular rhythm.  Lungs: Normal work of breathing. Neurologic: No focal deficits.   Lab Results  Component Value Date   CREATININE 0.84 07/26/2019   BUN 14 07/26/2019   NA 140 07/26/2019   K 4.5 07/26/2019   CL 104 07/26/2019   CO2 22 07/26/2019   Lab Results  Component Value Date   ALT 25 07/26/2019   AST 25 07/26/2019   ALKPHOS 68 07/26/2019   BILITOT 0.4 07/26/2019   Lab Results  Component Value Date   HGBA1C 5.5 11/22/2019   HGBA1C 5.4 07/26/2019   HGBA1C 5.5 09/28/2018   HGBA1C 5.4 05/24/2018   HGBA1C 5.3 12/10/2017   Lab Results  Component Value Date   INSULIN 32.5 (H) 11/22/2019   INSULIN 41.7 (H) 07/26/2019   INSULIN 29.7 (H) 09/28/2018   INSULIN 28.3 (H) 05/24/2018   INSULIN 43.6 (H) 12/10/2017   Lab Results  Component Value Date   TSH 2.160 07/26/2019   Lab Results  Component Value Date   CHOL 146 07/26/2019   HDL 46 07/26/2019   LDLCALC 90 07/26/2019   TRIG 45 07/26/2019   CHOLHDL 3.6 11/21/2015   Lab Results  Component Value Date   WBC 11.1 (H) 07/26/2019   HGB 12.9 07/26/2019   HCT 38.1 07/26/2019   MCV 88 07/26/2019   PLT 219 07/26/2019   Lab Results  Component Value Date   IRON 25 (L) 05/18/2017   TIBC 363 05/18/2017   FERRITIN 16 05/18/2017   Attestation Statements:   Reviewed by clinician on day of visit: allergies, medications, problem list, medical history, surgical history, family history, social history, and previous encounter notes.  IMichaelene Song, am acting as Location manager for [employer], FNP   I have reviewed the above documentation for accuracy and completeness, and I agree with the above. -  Georgianne Fick, FNP

## 2020-01-24 ENCOUNTER — Encounter (INDEPENDENT_AMBULATORY_CARE_PROVIDER_SITE_OTHER): Payer: Self-pay | Admitting: Family Medicine

## 2020-02-13 ENCOUNTER — Other Ambulatory Visit: Payer: Self-pay

## 2020-02-13 ENCOUNTER — Ambulatory Visit (INDEPENDENT_AMBULATORY_CARE_PROVIDER_SITE_OTHER): Payer: 59 | Admitting: Family Medicine

## 2020-02-13 ENCOUNTER — Encounter (INDEPENDENT_AMBULATORY_CARE_PROVIDER_SITE_OTHER): Payer: Self-pay | Admitting: Family Medicine

## 2020-02-13 VITALS — BP 98/66 | HR 92 | Temp 98.3°F | Ht 67.0 in | Wt >= 6400 oz

## 2020-02-13 DIAGNOSIS — F3289 Other specified depressive episodes: Secondary | ICD-10-CM | POA: Diagnosis not present

## 2020-02-13 DIAGNOSIS — R351 Nocturia: Secondary | ICD-10-CM | POA: Diagnosis not present

## 2020-02-13 DIAGNOSIS — E559 Vitamin D deficiency, unspecified: Secondary | ICD-10-CM | POA: Diagnosis not present

## 2020-02-13 DIAGNOSIS — Z6841 Body Mass Index (BMI) 40.0 and over, adult: Secondary | ICD-10-CM

## 2020-02-13 MED ORDER — BUPROPION HCL ER (SR) 150 MG PO TB12: 150.0000 mg | ORAL_TABLET | Freq: Two times a day (BID) | ORAL | 0 refills | Status: DC

## 2020-02-13 MED ORDER — VITAMIN D (ERGOCALCIFEROL) 1.25 MG (50000 UNIT) PO CAPS: 50000.0000 [IU] | ORAL_CAPSULE | ORAL | 0 refills | Status: DC

## 2020-02-13 NOTE — Progress Notes (Signed)
Chief Complaint:   OBESITY Catherine [name] is here to discuss her progress with her obesity treatment plan along with follow-up of her obesity related diagnoses. Nakkia is on following a lower carbohydrate, vegetable and lean protein rich diet plan and states she is following her eating plan approximately 90% of the time. Kamela states she is walking for 20 minutes 3 times per week.  Today's visit was #: 53 Starting weight: 468 lbs Starting date: 10/27/2016 Today's weight: 452 lbs Today's date: 02/13/2020 Total lbs lost to date: 16 Total lbs lost since last in-office visit: 0  Interim History: Catherine [name] denies eating large portions and she denies eating extra carbohydrates. She is portioning her snacks and has a serving of almonds per day. She is eating plenty of vegetables and drinking plenty of water. She notes that she has journaled in the past and felt overwhelmed and depressed. I am unable to determine why she is not losing weight at this point.  Subjective:   1. Vitamin D deficiency Catherine [name]'s last Vit D was low at 43.4. She is on weekly prescription Vit D.  2. Nocturia Catherine [name]'s nocturia has improved since cutting back on her amount of water and stopping water intake after 8:30pm. She is drinking 65 oz of water daily.  3. Other depression, with emotional eating Catherine [name] has seen a counselor at her primary care physician office, but is not seeing her anymore. She notes the bupropion helps with night eating. She denies emotional eating.    Assessment/Plan:   1. Vitamin D deficiency Low Vitamin D level contributes to fatigue and are associated with obesity, breast, and colon cancer. We will refill prescription Vitamin D for 1 month. Carmilla will follow-up for routine testing of Vitamin D, at least 2-3 times per year to avoid over-replacement.  - Vitamin D, Ergocalciferol, (DRISDOL) 1.25 MG (50000 UNIT) CAPS capsule; Take 1 capsule (50,000 Units total) by mouth every 7 (seven) days.   Dispense: 4 capsule; Refill: 0  2. Nocturia Jordon will continue her current water intake practices.  3. Other depression, with emotional eating Behavior modification techniques were discussed today to help Dia deal with her emotional/non-hunger eating behaviors. Catherine [name] declined to see Dr. Mallie Mussel, our Bariatric Psychologist. We will refill bupropion for 1 month. Orders and follow up as documented in patient record.   - buPROPion (WELLBUTRIN SR) 150 MG 12 hr tablet; Take 1 tablet (150 mg total) by mouth 2 (two) times daily.  Dispense: 60 tablet; Refill: 0   4. Class 3 severe obesity with serious comorbidity and body mass index (BMI) greater than or equal to 70 in adult, unspecified obesity type (HCC) Catherine [name] is currently in the action stage of change. As such, her goal is to continue with weight loss efforts. She has agreed to following a lower carbohydrate, vegetable and lean protein rich diet plan.   Exercise goals: All adults should avoid inactivity. Some physical activity is better than none, and adults who participate in any amount of physical activity gain some health benefits.  Behavioral modification strategies: decreasing simple carbohydrates and planning for success.  Harold has agreed to follow-up with our clinic in 3 weeks. She was informed of the importance of frequent follow-up visits to maximize her success with intensive lifestyle modifications for her multiple health conditions.   Objective:   Blood pressure 98/66, pulse 92, temperature 98.3 F (36.8 C), temperature source Oral, height 5\' 7"  (1.702 m), weight (!) 452 lb (205 kg), SpO2 95 %. Body mass index is  70.79 kg/m.  General: Cooperative, alert, well developed, in no acute distress. HEENT: Conjunctivae and lids unremarkable. Cardiovascular: Regular rhythm.  Lungs: Normal work of breathing. Neurologic: No focal deficits.   Lab Results  Component Value Date   CREATININE 0.84 07/26/2019   BUN 14 07/26/2019    NA 140 07/26/2019   K 4.5 07/26/2019   CL 104 07/26/2019   CO2 22 07/26/2019   Lab Results  Component Value Date   ALT 25 07/26/2019   AST 25 07/26/2019   ALKPHOS 68 07/26/2019   BILITOT 0.4 07/26/2019   Lab Results  Component Value Date   HGBA1C 5.5 11/22/2019   HGBA1C 5.4 07/26/2019   HGBA1C 5.5 09/28/2018   HGBA1C 5.4 05/24/2018   HGBA1C 5.3 12/10/2017   Lab Results  Component Value Date   INSULIN 32.5 (H) 11/22/2019   INSULIN 41.7 (H) 07/26/2019   INSULIN 29.7 (H) 09/28/2018   INSULIN 28.3 (H) 05/24/2018   INSULIN 43.6 (H) 12/10/2017   Lab Results  Component Value Date   TSH 2.160 07/26/2019   Lab Results  Component Value Date   CHOL 146 07/26/2019   HDL 46 07/26/2019   LDLCALC 90 07/26/2019   TRIG 45 07/26/2019   CHOLHDL 3.6 11/21/2015   Lab Results  Component Value Date   WBC 11.1 (H) 07/26/2019   HGB 12.9 07/26/2019   HCT 38.1 07/26/2019   MCV 88 07/26/2019   PLT 219 07/26/2019   Lab Results  Component Value Date   IRON 25 (L) 05/18/2017   TIBC 363 05/18/2017   FERRITIN 16 05/18/2017   Attestation Statements:   Reviewed by clinician on day of visit: allergies, medications, problem list, medical history, surgical history, family history, social history, and previous encounter notes.   Trude Mcburney, am acting as Energy manager for [place], FNP-C.  I have reviewed the above documentation for accuracy and completeness, and I agree with the above. -  Jesse Sans, FNP

## 2020-02-17 ENCOUNTER — Other Ambulatory Visit: Payer: Self-pay | Admitting: Family Medicine

## 2020-02-17 DIAGNOSIS — R609 Edema, unspecified: Secondary | ICD-10-CM

## 2020-02-17 MED FILL — PANTOPRAZOLE SOD DR 40 MG T: 40 | 30 days supply | Qty: 30 | Fill #1

## 2020-02-17 MED FILL — FUROSEMIDE 20 MG TABS: 20 | 30 days supply | Qty: 60 | Fill #0

## 2020-02-20 ENCOUNTER — Telehealth: Payer: 59 | Admitting: Emergency Medicine

## 2020-02-20 DIAGNOSIS — J069 Acute upper respiratory infection, unspecified: Secondary | ICD-10-CM

## 2020-02-20 MED ORDER — BENZONATATE 100 MG PO CAPS
100.0000 mg | ORAL_CAPSULE | Freq: Two times a day (BID) | ORAL | 0 refills | Status: DC | PRN
Start: 2020-02-20 — End: 2020-06-18

## 2020-02-20 MED ORDER — FLUTICASONE PROPIONATE 50 MCG/ACT NA SUSP: 2.0000 | Freq: Every day | NASAL | 0 refills | Status: DC

## 2020-02-20 NOTE — Progress Notes (Signed)

## 2020-02-29 ENCOUNTER — Other Ambulatory Visit: Payer: Self-pay

## 2020-02-29 ENCOUNTER — Encounter: Payer: Self-pay | Admitting: Family Medicine

## 2020-02-29 ENCOUNTER — Ambulatory Visit (INDEPENDENT_AMBULATORY_CARE_PROVIDER_SITE_OTHER): Payer: 59 | Admitting: Family Medicine

## 2020-02-29 ENCOUNTER — Telehealth: Payer: 59

## 2020-02-29 VITALS — BP 133/92 | HR 88 | Ht 67.0 in | Wt >= 6400 oz

## 2020-02-29 DIAGNOSIS — H60503 Unspecified acute noninfective otitis externa, bilateral: Secondary | ICD-10-CM

## 2020-02-29 MED ORDER — CIPROFLOXACIN-DEXAMETHASONE 0.3-0.1 % OT SUSP: 4.0000 [drp] | Freq: Two times a day (BID) | OTIC | 0 refills | Status: AC

## 2020-02-29 NOTE — Patient Instructions (Signed)
Today we ordered some Ciprodex drops for your otitis externa which is an outer ear infection.  You should start to feel better over the next couple days, if something starts to get significantly worse or you have any issues with balance/swelling around your ear/swallowing/breathing please go to the emergency department immediately  We have also signed your disabled parking placard for the next 6 months  Dr. Parke Simmers

## 2020-03-02 DIAGNOSIS — H60503 Unspecified acute noninfective otitis externa, bilateral: Secondary | ICD-10-CM | POA: Insufficient documentation

## 2020-03-02 NOTE — Assessment & Plan Note (Signed)
Patient with bilateral ear pain, said this been going on for roughly a week or so.  She says this is repeating issue for her.  She denies any systemic symptoms like fevers, balance changes, trouble swallowing or throat impact, there is no eye involvement  Physical exam shows otitis externa in both ears, Ciprodex drops given as this is what patient is used in the past to good effect

## 2020-03-02 NOTE — Progress Notes (Signed)
    SUBJECTIVE:   CHIEF COMPLAINT / HPI: ear pain  Acute otitis externa of both ears Patient with bilateral ear pain, said this been going on for roughly a week or so.  She says this is repeating issue for her.  She denies any systemic symptoms like fevers, balance changes, trouble swallowing or throat impact, there is no eye involvement  PERTINENT  PMH / PSH:   OBJECTIVE:   BP (!) 133/92   Pulse 88   Ht 5\' 7"  (1.702 m)   Wt (!) 454 lb 12.8 oz (206.3 kg)   SpO2 100%   BMI 71.23 kg/m   General: Alert and pleasant no distress Ear exam: General erythema with no indication of trauma to both ear canals, tympanic membranes appear to be clear with no bulging or purulent fluid behind them.  There is some moderate tenderness to manipulation of the pinna on the left side  ASSESSMENT/PLAN:   Acute otitis externa of both ears Patient with bilateral ear pain, said this been going on for roughly a week or so.  She says this is repeating issue for her.  She denies any systemic symptoms like fevers, balance changes, trouble swallowing or throat impact, there is no eye involvement  Physical exam shows otitis externa in both ears, Ciprodex drops given as this is what patient is used in the past to good effect     , DO [facility]

## 2020-03-06 ENCOUNTER — Ambulatory Visit (INDEPENDENT_AMBULATORY_CARE_PROVIDER_SITE_OTHER): Payer: 59 | Admitting: Family Medicine

## 2020-03-06 ENCOUNTER — Other Ambulatory Visit: Payer: Self-pay

## 2020-03-06 ENCOUNTER — Encounter (INDEPENDENT_AMBULATORY_CARE_PROVIDER_SITE_OTHER): Payer: Self-pay | Admitting: Family Medicine

## 2020-03-06 ENCOUNTER — Telehealth: Payer: 59 | Admitting: Physician Assistant

## 2020-03-06 VITALS — BP 106/68 | HR 102 | Temp 98.2°F | Ht 67.0 in | Wt >= 6400 oz

## 2020-03-06 DIAGNOSIS — E559 Vitamin D deficiency, unspecified: Secondary | ICD-10-CM | POA: Diagnosis not present

## 2020-03-06 DIAGNOSIS — Z9189 Other specified personal risk factors, not elsewhere classified: Secondary | ICD-10-CM | POA: Diagnosis not present

## 2020-03-06 DIAGNOSIS — F3289 Other specified depressive episodes: Secondary | ICD-10-CM | POA: Diagnosis not present

## 2020-03-06 DIAGNOSIS — E8881 Metabolic syndrome: Secondary | ICD-10-CM

## 2020-03-06 DIAGNOSIS — Z6841 Body Mass Index (BMI) 40.0 and over, adult: Secondary | ICD-10-CM

## 2020-03-06 DIAGNOSIS — R399 Unspecified symptoms and signs involving the genitourinary system: Secondary | ICD-10-CM | POA: Diagnosis not present

## 2020-03-06 MED ORDER — NITROFURANTOIN MONOHYD MACRO 100 MG PO CAPS: 100.0000 mg | ORAL_CAPSULE | Freq: Two times a day (BID) | ORAL | 0 refills | Status: DC

## 2020-03-06 NOTE — Progress Notes (Signed)
Chief Complaint:   OBESITY Catherine [name] is here to discuss her progress with her obesity treatment plan along with follow-up of her obesity related diagnoses. Catherine [name] is on following a lower carbohydrate, vegetable and lean protein rich diet plan and states she is following her eating plan approximately 90% of the time. Catherine [name] states she is doing low cardio and walking for 30 minutes 4 times per week.  Today's visit was #: 51 Starting weight: 468 lbs Starting date: 10/27/2016 Today's weight: 452 lbs Today's date: 03/06/2020 Total lbs lost to date: 16 Total lbs lost since last in-office visit: 0  Interim History: Catherine [name] is feeling better overall. She has been ill recently with URI and otitis externa.  She reports she is sticking to the Low carbohydrate plan well. She started working out in the last few weeks. Her water intake and vegetable intake are good. Breakfast is eggs and cheese, lunch is meat and vegetables, and dinner is fish and collards or cabbage. She denies large portion size or eating extra carbs.   Subjective:   1. Insulin resistance Graciela has a diagnosis of insulin resistance based on her elevated fasting insulin level >5. She denies polyphagia, and she is not on metformin. She continues to work on diet and exercise to decrease her risk of diabetes.  Lab Results  Component Value Date   INSULIN 32.5 (H) 11/22/2019   INSULIN 41.7 (H) 07/26/2019   INSULIN 29.7 (H) 09/28/2018   INSULIN 28.3 (H) 05/24/2018   INSULIN 43.6 (H) 12/10/2017   Lab Results  Component Value Date   HGBA1C 5.5 11/22/2019   2. Vitamin D deficiency Lam is on Vit D every 7 days. Last Vit D level was nearly at goal at 43.4.  3. Other depression, with emotional eating Catherine [name] is on bupropion. She notes cravings are fairly well controlled with bupropion. She denies insomnia, but notes dry mouth.  4. At risk for dehydration Catherine [name] is at risk for dehydration due to being on the Low  carbohydrate plan.  Assessment/Plan:   1. Insulin resistance Yareth will continue to work on weight loss, exercise, and decreasing simple carbohydrates to help decrease the risk of diabetes. We will check labs today. Bunny agreed to follow-up with [name] as directed to closely monitor her progress.  - Comprehensive metabolic panel - Hemoglobin A1c - Insulin, random  2. Vitamin D deficiency Low Vitamin D level contributes to fatigue and are associated with obesity, breast, and colon cancer. Lona agreed to continue taking prescription Vitamin D 50,000 IU every week and will follow-up for routine testing of Vitamin D, at least 2-3 times per year to avoid over-replacement. We will check labs today.  - VITAMIN D 25 Hydroxy (Vit-D Deficiency, Fractures)  3. Other depression, with emotional eating Behavior modification techniques were discussed today to help Catherine [name] deal with her emotional/non-hunger eating behaviors. Catherine [name] will continue bupropion and will follow up as directed. Orders and follow up as documented in patient record.   4. At risk for dehydration Catherine [name] was given approximately 15 minutes dehydration prevention counseling today. Catherine [name] is at risk for dehydration due to weight loss and current medication(s). She was encouraged to hydrate and monitor fluid status to avoid dehydration as well as weight loss plateaus.   5. Class 3 severe obesity with serious comorbidity and body mass index (BMI) greater than or equal to 70 in adult, unspecified obesity type (HCC) Catherine [name] is currently in the action stage of change. As such, her goal is to continue  with weight loss efforts. She has agreed to following a lower carbohydrate, vegetable and lean protein rich diet plan.   Catherine [name] will monitor her portion sizes.  Exercise goals: As is.  Behavioral modification strategies: increasing vegetables, emotional eating strategies and planning for success.  Thi has agreed to follow-up with  our clinic in 3 weeks. She was informed of the importance of frequent follow-up visits to maximize her success with intensive lifestyle modifications for her multiple health conditions.   Catherine [name] was informed we would discuss her lab results at her next visit unless there is a critical issue that needs to be addressed sooner. Catherine [name] agreed to keep her next visit at the agreed upon time to discuss these results.  Objective:   Blood pressure 106/68, pulse (!) 102, temperature 98.2 F (36.8 C), temperature source Oral, height 5\' 7"  (1.702 m), weight (!) 452 lb (205 kg), SpO2 97 %. Body mass index is 70.79 kg/m.  General: Cooperative, alert, well developed, in no acute distress. HEENT: Conjunctivae and lids unremarkable. Cardiovascular: Regular rhythm.  Lungs: Normal work of breathing. Neurologic: No focal deficits.   Lab Results  Component Value Date   CREATININE 0.84 07/26/2019   BUN 14 07/26/2019   NA 140 07/26/2019   K 4.5 07/26/2019   CL 104 07/26/2019   CO2 22 07/26/2019   Lab Results  Component Value Date   ALT 25 07/26/2019   AST 25 07/26/2019   ALKPHOS 68 07/26/2019   BILITOT 0.4 07/26/2019   Lab Results  Component Value Date   HGBA1C 5.5 11/22/2019   HGBA1C 5.4 07/26/2019   HGBA1C 5.5 09/28/2018   HGBA1C 5.4 05/24/2018   HGBA1C 5.3 12/10/2017   Lab Results  Component Value Date   INSULIN 32.5 (H) 11/22/2019   INSULIN 41.7 (H) 07/26/2019   INSULIN 29.7 (H) 09/28/2018   INSULIN 28.3 (H) 05/24/2018   INSULIN 43.6 (H) 12/10/2017   Lab Results  Component Value Date   TSH 2.160 07/26/2019   Lab Results  Component Value Date   CHOL 146 07/26/2019   HDL 46 07/26/2019   LDLCALC 90 07/26/2019   TRIG 45 07/26/2019   CHOLHDL 3.6 11/21/2015   Lab Results  Component Value Date   WBC 11.1 (H) 07/26/2019   HGB 12.9 07/26/2019   HCT 38.1 07/26/2019   MCV 88 07/26/2019   PLT 219 07/26/2019   Lab Results  Component Value Date   IRON 25 (L) 05/18/2017   TIBC  363 05/18/2017   FERRITIN 16 05/18/2017   Attestation Statements:   Reviewed by clinician on day of visit: allergies, medications, problem list, medical history, surgical history, family history, social history, and previous encounter notes.   07/18/2017, am acting as Trude Mcburney for Energy manager, FNP-C.  I have reviewed the above documentation for accuracy and completeness, and I agree with the above. -  [place], FNP

## 2020-03-06 NOTE — Progress Notes (Signed)

## 2020-03-07 LAB — COMPREHENSIVE METABOLIC PANEL
ALT: 16 IU/L (ref 0–32)
AST: 18 IU/L (ref 0–40)
Albumin/Globulin Ratio: 1.1 — ABNORMAL LOW (ref 1.2–2.2)
Albumin: 3.9 g/dL (ref 3.8–4.8)
Alkaline Phosphatase: 68 IU/L (ref 48–121)
BUN/Creatinine Ratio: 18 (ref 9–23)
BUN: 17 mg/dL (ref 6–20)
Bilirubin Total: 0.4 mg/dL (ref 0.0–1.2)
CO2: 18 mmol/L — ABNORMAL LOW (ref 20–29)
Calcium: 9 mg/dL (ref 8.7–10.2)
Chloride: 104 mmol/L (ref 96–106)
Creatinine, Ser: 0.94 mg/dL (ref 0.57–1.00)
GFR calc Af Amer: 89 mL/min/{1.73_m2} (ref >59)
GFR calc non Af Amer: 77 mL/min/{1.73_m2} (ref >59)
Globulin, Total: 3.4 g/dL (ref 1.5–4.5)
Glucose: 90 mg/dL (ref 65–99)
Potassium: 4.5 mmol/L (ref 3.5–5.2)
Sodium: 137 mmol/L (ref 134–144)
Total Protein: 7.3 g/dL (ref 6.0–8.5)

## 2020-03-07 LAB — HEMOGLOBIN A1C
Est. average glucose Bld gHb Est-mCnc: 108 mg/dL
Hgb A1c MFr Bld: 5.4 % (ref 4.8–5.6)

## 2020-03-07 LAB — VITAMIN D 25 HYDROXY (VIT D DEFICIENCY, FRACTURES): Vit D, 25-Hydroxy: 42.3 ng/mL (ref 30.0–100.0)

## 2020-03-07 LAB — INSULIN, RANDOM: INSULIN: 39.2 u[IU]/mL — ABNORMAL HIGH (ref 2.6–24.9)

## 2020-04-03 ENCOUNTER — Other Ambulatory Visit: Payer: Self-pay

## 2020-04-03 ENCOUNTER — Ambulatory Visit (INDEPENDENT_AMBULATORY_CARE_PROVIDER_SITE_OTHER): Payer: 59 | Admitting: Family Medicine

## 2020-04-03 ENCOUNTER — Encounter (INDEPENDENT_AMBULATORY_CARE_PROVIDER_SITE_OTHER): Payer: Self-pay | Admitting: Family Medicine

## 2020-04-03 VITALS — BP 87/58 | HR 93 | Temp 98.4°F | Ht 67.0 in | Wt >= 6400 oz

## 2020-04-03 DIAGNOSIS — Z9189 Other specified personal risk factors, not elsewhere classified: Secondary | ICD-10-CM | POA: Diagnosis not present

## 2020-04-03 DIAGNOSIS — E559 Vitamin D deficiency, unspecified: Secondary | ICD-10-CM | POA: Diagnosis not present

## 2020-04-03 DIAGNOSIS — F3289 Other specified depressive episodes: Secondary | ICD-10-CM

## 2020-04-03 DIAGNOSIS — Z6841 Body Mass Index (BMI) 40.0 and over, adult: Secondary | ICD-10-CM

## 2020-04-03 MED ORDER — VITAMIN D (ERGOCALCIFEROL) 1.25 MG (50000 UNIT) PO CAPS: 50000.0000 [IU] | ORAL_CAPSULE | ORAL | 0 refills | Status: DC

## 2020-04-03 MED ORDER — BUPROPION HCL ER (SR) 150 MG PO TB12: 150.0000 mg | ORAL_TABLET | Freq: Two times a day (BID) | ORAL | 0 refills | Status: DC

## 2020-04-04 NOTE — Progress Notes (Signed)
Chief Complaint:   OBESITY Catherine [name] is here to discuss her progress with her obesity treatment plan along with follow-up of her obesity related diagnoses. Catherine [name] is on following a lower carbohydrate, vegetable and lean protein rich diet plan and states she is following her eating plan approximately 90% of the time. Catherine [name] states she is at the gym for 35 minutes 4-5 times per week.  Today's visit was #: 38 Starting weight: 468 lbs Starting date: 10/27/2016 Today's weight: 445 lbs Today's date: 04/03/2020 Total lbs lost to date: 23 Total lbs lost since last in-office visit: 7  Interim History: Catherine [name] notes she is back in the routine of going to the gym. She has stuck to the p;an very well over the past few weeks. She is having protein at all meals. Her hunger is satisfied and she denies constipation.  Subjective:   1. Vitamin D deficiency Catherine [name]'s last Vit D level was 42.3, slightly low. She admits to forgetting doses sometimes. She is on weekly prescription Vit D.  2. Other depression, with emotional eating Catherine [name] notes bupropion works well for hunger and cravings.  3. At risk for osteoporosis Catherine [name] is at higher risk of osteopenia and osteoporosis due to Vitamin D deficiency.   Assessment/Plan:   1. Vitamin D deficiency Low Vitamin D level contributes to fatigue and are associated with obesity, breast, and colon cancer. We will refill prescription Vitamin D for 1 month. [name] will follow-up for routine testing of Vitamin D, at least 2-3 times per year to avoid over-replacement.  - Vitamin D, Ergocalciferol, (DRISDOL) 1.25 MG (50000 UNIT) CAPS capsule; Take 1 capsule (50,000 Units total) by mouth every 7 (seven) days.  Dispense: 12 capsule; Refill: 0  2. Other depression, with emotional eating Behavior modification techniques were discussed today to help Catherine [name] deal with her emotional/non-hunger eating behaviors. We will refill bupropion for 90 days with no refills.  Orders and follow up as documented in patient record.   - buPROPion (WELLBUTRIN SR) 150 MG 12 hr tablet; Take 1 tablet (150 mg total) by mouth 2 (two) times daily.  Dispense: 180 tablet; Refill: 0  3. At risk for osteoporosis Catherine [name] was given approximately 15 minutes of osteoporosis prevention counseling today. Catherine [name] is at risk for osteopenia and osteoporosis due to her Vitamin D deficiency. She was encouraged to take her Vitamin D and follow her higher calcium diet and increase strengthening exercise to help strengthen her bones and decrease her risk of osteopenia and osteoporosis.  Repetitive spaced learning was employed today to elicit superior memory formation and behavioral change.  4. Class 3 severe obesity with serious comorbidity and body mass index (BMI) of 60.0 to 69.9 in adult, unspecified obesity type (HCC) Catherine [name] is currently in the action stage of change. As such, her goal is to continue with weight loss efforts. She has agreed to following a lower carbohydrate, vegetable and lean protein rich diet plan.   Exercise goals: As is.  Behavioral modification strategies: increasing water intake, meal planning and cooking strategies, keeping healthy foods in the home and planning for success.  Catherine [name] has agreed to follow-up with our clinic in 3 weeks. She was informed of the importance of frequent follow-up visits to maximize her success with intensive lifestyle modifications for her multiple health conditions.   Objective:   Blood pressure (!) 87/58, pulse 93, temperature 98.4 F (36.9 C), temperature source Oral, height 5\' 7"  (1.702 m), weight (!) 445 lb (201.9 kg), SpO2 99 %. Body mass  index is 69.7 kg/m.  General: Cooperative, alert, well developed, in no acute distress. HEENT: Conjunctivae and lids unremarkable. Cardiovascular: Regular rhythm.  Lungs: Normal work of breathing. Neurologic: No focal deficits.   Lab Results  Component Value Date   CREATININE 0.94  03/06/2020   BUN 17 03/06/2020   NA 137 03/06/2020   K 4.5 03/06/2020   CL 104 03/06/2020   CO2 18 (L) 03/06/2020   Lab Results  Component Value Date   ALT 16 03/06/2020   AST 18 03/06/2020   ALKPHOS 68 03/06/2020   BILITOT 0.4 03/06/2020   Lab Results  Component Value Date   HGBA1C 5.4 03/06/2020   HGBA1C 5.5 11/22/2019   HGBA1C 5.4 07/26/2019   HGBA1C 5.5 09/28/2018   HGBA1C 5.4 05/24/2018   Lab Results  Component Value Date   INSULIN 39.2 (H) 03/06/2020   INSULIN 32.5 (H) 11/22/2019   INSULIN 41.7 (H) 07/26/2019   INSULIN 29.7 (H) 09/28/2018   INSULIN 28.3 (H) 05/24/2018   Lab Results  Component Value Date   TSH 2.160 07/26/2019   Lab Results  Component Value Date   CHOL 146 07/26/2019   HDL 46 07/26/2019   LDLCALC 90 07/26/2019   TRIG 45 07/26/2019   CHOLHDL 3.6 11/21/2015   Lab Results  Component Value Date   WBC 11.1 (H) 07/26/2019   HGB 12.9 07/26/2019   HCT 38.1 07/26/2019   MCV 88 07/26/2019   PLT 219 07/26/2019   Lab Results  Component Value Date   IRON 25 (L) 05/18/2017   TIBC 363 05/18/2017   FERRITIN 16 05/18/2017   Attestation Statements:   Reviewed by clinician on day of visit: allergies, medications, problem list, medical history, surgical history, family history, social history, and previous encounter notes.   Trude Mcburney, am acting as Energy manager for [place], FNP-C.  I have reviewed the above documentation for accuracy and completeness, and I agree with the above. -  Jesse Sans, FNP

## 2020-04-09 ENCOUNTER — Encounter (INDEPENDENT_AMBULATORY_CARE_PROVIDER_SITE_OTHER): Payer: Self-pay | Admitting: Family Medicine

## 2020-04-09 ENCOUNTER — Other Ambulatory Visit: Payer: Self-pay | Admitting: *Deleted

## 2020-04-09 MED ORDER — PANTOPRAZOLE SODIUM 40 MG PO TBEC: 40.0000 mg | DELAYED_RELEASE_TABLET | Freq: Every day | ORAL | 3 refills | Status: DC

## 2020-04-10 ENCOUNTER — Other Ambulatory Visit: Payer: Self-pay

## 2020-04-10 MED ORDER — PANTOPRAZOLE SODIUM 40 MG PO TBEC: 40.0000 mg | DELAYED_RELEASE_TABLET | Freq: Every day | ORAL | 0 refills | Status: DC

## 2020-04-25 ENCOUNTER — Encounter (INDEPENDENT_AMBULATORY_CARE_PROVIDER_SITE_OTHER): Payer: Self-pay | Admitting: Family Medicine

## 2020-04-25 ENCOUNTER — Other Ambulatory Visit: Payer: Self-pay

## 2020-04-25 ENCOUNTER — Ambulatory Visit (INDEPENDENT_AMBULATORY_CARE_PROVIDER_SITE_OTHER): Payer: 59 | Admitting: Family Medicine

## 2020-04-25 VITALS — BP 101/71 | HR 78 | Temp 98.1°F | Ht 67.0 in | Wt >= 6400 oz

## 2020-04-25 DIAGNOSIS — G4733 Obstructive sleep apnea (adult) (pediatric): Secondary | ICD-10-CM | POA: Diagnosis not present

## 2020-04-25 DIAGNOSIS — Z6841 Body Mass Index (BMI) 40.0 and over, adult: Secondary | ICD-10-CM

## 2020-04-26 NOTE — Progress Notes (Signed)
Chief Complaint:   OBESITY Catherine [name] is here to discuss her progress with her obesity treatment plan along with follow-up of her obesity related diagnoses. Catherine [name] is on following a lower carbohydrate, vegetable and lean protein rich diet plan and states she is following her eating plan approximately 90% of the time. Catherine [name] states she is going to the gym for 30-45 minutes 7 times per week.  Today's visit was #: 66 Starting weight: 468 lbs Starting date: 10/27/2016 Today's weight: 445 lbs Today's date: 04/25/2020 Total lbs lost to date: 23 Total lbs lost since last in-office visit: 0  Interim History: Catherine [name]'s starting weight was 468 on 10/27/2016. She lost down to 410 lbs (09/02/18) but has since regained to 445 lbs. She reports always reports good adherence to the plan.  Catherine [name] notes she is eating plenty of vegetables and drinking plenty of water. She denies fatty meats such as pepperoni. She does not skip meals or drink sugar sweetened beverages.  She meal preps salads for the whole week. She is using full fat dressing. She notes journaling makes her depressed. She feels her calorie intake is around 1400 calories per day.   Subjective:   1. OSA (obstructive sleep apnea) Catherine [name] does not wear her CPAP as directed. She feels her sleep apnea has improved since she has had some weight loss. She notes her heaviest weight was 490 lbs in 2011.  Assessment/Plan:   1. OSA (obstructive sleep apnea) Intensive lifestyle modifications are the first line treatment for this issue. We discussed several lifestyle modifications today. Catherine [name] was encouraged to follow up with sleep medicine. She will continue to work on diet, exercise and weight loss efforts. We will continue to monitor.   2. Class 3 severe obesity with serious comorbidity and body mass index (BMI) of 60.0 to 69.9 in adult, unspecified obesity type (HCC) Catherine [name] is currently in the action stage of change. As such, her goal is to  continue with weight loss efforts. She has agreed to following a lower carbohydrate, vegetable and lean protein rich diet plan.   We discussed trying Skinny Girl balsamic vinagrette. She is to try to increase her calories to 1800-2000 per day.  Exercise goals: As is.  Behavioral modification strategies: increasing lean protein intake and planning for success.  Catherine [name] has agreed to follow-up with our clinic in 3 weeks. She was informed of the importance of frequent follow-up visits to maximize her success with intensive lifestyle modifications for her multiple health conditions.   Objective:   Blood pressure 101/71, pulse 78, temperature 98.1 F (36.7 C), temperature source Oral, height 5\' 7"  (1.702 m), weight (!) 445 lb (201.9 kg), SpO2 98 %. Body mass index is 69.7 kg/m.  General: Cooperative, alert, well developed, in no acute distress. HEENT: Conjunctivae and lids unremarkable. Cardiovascular: Regular rhythm.  Lungs: Normal work of breathing. Neurologic: No focal deficits.   Lab Results  Component Value Date   CREATININE 0.94 03/06/2020   BUN 17 03/06/2020   NA 137 03/06/2020   K 4.5 03/06/2020   CL 104 03/06/2020   CO2 18 (L) 03/06/2020   Lab Results  Component Value Date   ALT 16 03/06/2020   AST 18 03/06/2020   ALKPHOS 68 03/06/2020   BILITOT 0.4 03/06/2020   Lab Results  Component Value Date   HGBA1C 5.4 03/06/2020   HGBA1C 5.5 11/22/2019   HGBA1C 5.4 07/26/2019   HGBA1C 5.5 09/28/2018   HGBA1C 5.4 05/24/2018   Lab Results  Component  Value Date   INSULIN 39.2 (H) 03/06/2020   INSULIN 32.5 (H) 11/22/2019   INSULIN 41.7 (H) 07/26/2019   INSULIN 29.7 (H) 09/28/2018   INSULIN 28.3 (H) 05/24/2018   Lab Results  Component Value Date   TSH 2.160 07/26/2019   Lab Results  Component Value Date   CHOL 146 07/26/2019   HDL 46 07/26/2019   LDLCALC 90 07/26/2019   TRIG 45 07/26/2019   CHOLHDL 3.6 11/21/2015   Lab Results  Component Value Date   WBC 11.1  (H) 07/26/2019   HGB 12.9 07/26/2019   HCT 38.1 07/26/2019   MCV 88 07/26/2019   PLT 219 07/26/2019   Lab Results  Component Value Date   IRON 25 (L) 05/18/2017   TIBC 363 05/18/2017   FERRITIN 16 05/18/2017   Attestation Statements:   Reviewed by clinician on day of visit: allergies, medications, problem list, medical history, surgical history, family history, social history, and previous encounter notes.   Trude Mcburney, am acting as Energy manager for [place], FNP-C.  I have reviewed the above documentation for accuracy and completeness, and I agree with the above. -  Jesse Sans, FNP

## 2020-04-27 ENCOUNTER — Encounter (INDEPENDENT_AMBULATORY_CARE_PROVIDER_SITE_OTHER): Payer: Self-pay | Admitting: Family Medicine

## 2020-05-15 ENCOUNTER — Ambulatory Visit (INDEPENDENT_AMBULATORY_CARE_PROVIDER_SITE_OTHER): Payer: 59 | Admitting: Family Medicine

## 2020-05-15 ENCOUNTER — Encounter (INDEPENDENT_AMBULATORY_CARE_PROVIDER_SITE_OTHER): Payer: Self-pay | Admitting: Family Medicine

## 2020-05-15 ENCOUNTER — Other Ambulatory Visit: Payer: Self-pay

## 2020-05-15 VITALS — BP 107/72 | HR 89 | Temp 97.8°F | Ht 67.0 in | Wt >= 6400 oz

## 2020-05-15 DIAGNOSIS — Z6841 Body Mass Index (BMI) 40.0 and over, adult: Secondary | ICD-10-CM

## 2020-05-15 DIAGNOSIS — F3289 Other specified depressive episodes: Secondary | ICD-10-CM | POA: Diagnosis not present

## 2020-05-15 DIAGNOSIS — J45909 Unspecified asthma, uncomplicated: Secondary | ICD-10-CM

## 2020-05-15 NOTE — Progress Notes (Signed)
Chief Complaint:   OBESITY Catherine [name] is here to discuss her progress with her obesity treatment plan along with follow-up of her obesity related diagnoses. Catherine [name] is on following a lower carbohydrate, vegetable and lean protein rich diet plan and states she is following her eating plan approximately 85% of the time. Catherine [name] states she is at the gym doing cardio and weights for 45 minutes 6 times per week.  Today's visit was #: 67 Starting weight: 468 lbs Starting date: 10/27/2016 Today's weight: 446 lbs Today's date: 05/15/2020 Total lbs lost to date: 22 Total lbs lost since last in-office visit: 0  Interim History: Catherine [name]'s IC completed today and RMR is 2481, which is down from 2952 on 11/03/2018. Her initial RMR was 2045 on 10/27/2016. She reports eating about 1300 calories per day. She is open to switching back to Category 4 plan.  Subjective:   1. Asthma, unspecified asthma severity, unspecified whether complicated, unspecified whether persistent Catherine [name]'s asthma is stable, and she denies symptoms.  2. Other depression, with emotional eating Catherine [name]'s symptoms are well controlled with bupropion 150 mg q daily.  Assessment/Plan:   1. Asthma, unspecified asthma severity, unspecified whether complicated, unspecified whether persistent IC was done today, and Catherine [name] will continue to follow up as directed.  2. Other depression, with emotional eating Behavior modification techniques were discussed today to help Catherine [name] deal with her emotional/non-hunger eating behaviors. Catherine [name] will continue Wellbutrin. Orders and follow up as documented in patient record.   3. Class 3 severe obesity with serious comorbidity and body mass index (BMI) greater than or equal to 70 in adult, unspecified obesity type (HCC) Catherine [name] is currently in the action stage of change. As such, her goal is to continue with weight loss efforts. She has agreed to the Category 4 Plan.   We will switch back to  Category 4 plan, as this will increase her calorie intake. Category 4 plan handout was provided today.  Exercise goals: As is.  Behavioral modification strategies: increasing lean protein intake.  Catherine [name] has agreed to follow-up with our clinic in 3 weeks. She was informed of the importance of frequent follow-up visits to maximize her success with intensive lifestyle modifications for her multiple health conditions.   Objective:   Blood pressure 107/72, pulse 89, temperature 97.8 F (36.6 C), height 5\' 7"  (1.702 m), weight (!) 446 lb (202.3 kg), SpO2 100 %. Body mass index is 69.85 kg/m.  General: Cooperative, alert, well developed, in no acute distress. HEENT: Conjunctivae and lids unremarkable. Cardiovascular: Regular rhythm.  Lungs: Normal work of breathing. Neurologic: No focal deficits.   Lab Results  Component Value Date   CREATININE 0.94 03/06/2020   BUN 17 03/06/2020   NA 137 03/06/2020   K 4.5 03/06/2020   CL 104 03/06/2020   CO2 18 (L) 03/06/2020   Lab Results  Component Value Date   ALT 16 03/06/2020   AST 18 03/06/2020   ALKPHOS 68 03/06/2020   BILITOT 0.4 03/06/2020   Lab Results  Component Value Date   HGBA1C 5.4 03/06/2020   HGBA1C 5.5 11/22/2019   HGBA1C 5.4 07/26/2019   HGBA1C 5.5 09/28/2018   HGBA1C 5.4 05/24/2018   Lab Results  Component Value Date   INSULIN 39.2 (H) 03/06/2020   INSULIN 32.5 (H) 11/22/2019   INSULIN 41.7 (H) 07/26/2019   INSULIN 29.7 (H) 09/28/2018   INSULIN 28.3 (H) 05/24/2018   Lab Results  Component Value Date   TSH 2.160 07/26/2019   Lab Results  Component Value Date   CHOL 146 07/26/2019   HDL 46 07/26/2019   LDLCALC 90 07/26/2019   TRIG 45 07/26/2019   CHOLHDL 3.6 11/21/2015   Lab Results  Component Value Date   WBC 11.1 (H) 07/26/2019   HGB 12.9 07/26/2019   HCT 38.1 07/26/2019   MCV 88 07/26/2019   PLT 219 07/26/2019   Lab Results  Component Value Date   IRON 25 (L) 05/18/2017   TIBC 363  05/18/2017   FERRITIN 16 05/18/2017   Attestation Statements:   Reviewed by clinician on day of visit: allergies, medications, problem list, medical history, surgical history, family history, social history, and previous encounter notes.   Trude Mcburney, am acting as Energy manager for [place], FNP-C.  I have reviewed the above documentation for accuracy and completeness, and I agree with the above. -  Jesse Sans, FNP

## 2020-05-25 ENCOUNTER — Other Ambulatory Visit: Payer: Self-pay

## 2020-05-25 ENCOUNTER — Encounter: Payer: Self-pay | Admitting: Family Medicine

## 2020-05-25 ENCOUNTER — Ambulatory Visit: Payer: 59 | Admitting: Family Medicine

## 2020-05-25 VITALS — BP 108/70 | HR 94 | Wt >= 6400 oz

## 2020-05-25 DIAGNOSIS — M7989 Other specified soft tissue disorders: Secondary | ICD-10-CM

## 2020-05-25 DIAGNOSIS — S8992XA Unspecified injury of left lower leg, initial encounter: Secondary | ICD-10-CM

## 2020-05-25 MED ORDER — NAPROXEN 500 MG PO TBEC: 500.0000 mg | DELAYED_RELEASE_TABLET | Freq: Two times a day (BID) | ORAL | 0 refills | Status: AC

## 2020-05-25 NOTE — Patient Instructions (Signed)
It was nice seeing you today. I am sorry about your recent fall. Glad to know your bruising is improving. Please allow time to heal. I have sent in Naproxen for pain. Continue current dose of your Lasix and come in soon if there is no improvement of your symptoms.

## 2020-05-25 NOTE — Progress Notes (Signed)
    SUBJECTIVE:   CHIEF COMPLAINT / HPI:  Catherine [name] in bathroom whill trying to get into the bath tub, she lost her balance and hit her right shin against the tub. She brused a bit and since then she noticed some swelling and pain. She uses Tylenol and Ibuprofen as needed with minimal improvement. Pain is gravated by walking or prolonged standing. She has been exercising a lot at the gym this past week which made her symptoms worse. Her pain is about 7/10 in severity.  Chronic edema: She feels like she has retained more fluid. She is compliant with her Lasix 40 mg qd and she is responding well with urination. She wants to replace her compression stocking, that last time she got one through her insurance, she paid $65 out of pocket at the [employer]. No other concerns.     PERTINENT  PMH / PSH: PMX reviewed.  OBJECTIVE:   Vitals:   05/25/20 1037 05/25/20 1106  BP: 108/70   Pulse: (!) 104 94  SpO2: 96%   Weight: (!) 460 lb (208.7 kg)     Physical Exam Vitals and nursing note reviewed.  Cardiovascular:     Rate and Rhythm: Normal rate and regular rhythm.     Heart sounds: Normal heart sounds. No murmur heard.   Pulmonary:     Effort: Pulmonary effort is normal. No respiratory distress.     Breath sounds: Normal breath sounds. No wheezing or rhonchi.  Abdominal:     General: Abdomen is flat. Bowel sounds are normal. There is no distension.     Palpations: Abdomen is soft. There is no mass.     Tenderness: There is no abdominal tenderness.  Musculoskeletal:     Comments: Trace B/L Pedal swelling. Mild clearing bruise on the lateral surface of her right shin, no swelling, mild tenderness. Otherwise, normal ROM.  Neurological:     Mental Status: She is alert and oriented to person, place, and time.        ASSESSMENT/PLAN:   Right shin pain: S/P trauma from mechanical fall. Light bruising seems to be clearing. The patient was reassured that since she can  walk without difficulty and her bruise is clearing, I am not worried about having a fractured bone or dislocation. Naproxen prescribed for pain. Warm compression was discussed. Return precaution discussed. She agreed with the plan.   Chronic leg edema: No change in Lasix dose. I referred her to CCM case manager to help coordinate her compression stockings purchase. F/U with PCP for other concerns.    Janit Pagan, MD [facility]

## 2020-05-30 ENCOUNTER — Telehealth: Payer: Self-pay | Admitting: *Deleted

## 2020-05-30 NOTE — Chronic Care Management (AMB) (Signed)
  Care Management   Note  05/30/2020 Name: Catherine [name] MRN: [id number] DOB: 16-Jan-1981  Catherine [name] is a 39 y.o. year old female who is a primary care patient of Simmons-Robinson, Tawanna Cooler, MD. I reached out to Catherine [name] by phone today in response to a referral sent by Ms. Marylene Buerger health plan.    Catherine [name] was given information about care management services today including:  1. Care management services include personalized support from designated clinical staff supervised by her physician, including individualized plan of care and coordination with other care providers 2. 24/7 contact phone numbers for assistance for urgent and routine care needs. 3. The patient may stop care management services at any time by phone call to the office staff.  Patient agreed to services and verbal consent obtained.   Follow up plan: Telephone appointment with care management team member scheduled for:06/06/2020  [facility] Guide, Embedded Care Coordination [facility]  [place], [place] [address] Direct Dial: [phone] [email or web address]: [email or web address]

## 2020-06-06 ENCOUNTER — Ambulatory Visit: Payer: 59

## 2020-06-06 ENCOUNTER — Other Ambulatory Visit: Payer: Self-pay | Admitting: Family Medicine

## 2020-06-06 ENCOUNTER — Ambulatory Visit (INDEPENDENT_AMBULATORY_CARE_PROVIDER_SITE_OTHER): Payer: 59 | Admitting: Family Medicine

## 2020-06-06 ENCOUNTER — Other Ambulatory Visit: Payer: Self-pay

## 2020-06-06 DIAGNOSIS — Z9181 History of falling: Secondary | ICD-10-CM

## 2020-06-06 NOTE — Progress Notes (Signed)
Physical Therapy Received: Today Catherine Fairly, RN  Doreene Eland, MD Dr. Lum Babe   I spoke with the above patient and she states that she would like to do aquatic Physical Therapy. She feels that it will be better for her joints. The name of the place is [facility] and the number is [phone]. She would like for you to make a referral for her.   Thank you.   Traci

## 2020-06-08 NOTE — Patient Instructions (Signed)
Visit Information  Goals Addressed              This Visit's Progress   .  I need compression stocking made with better material. (pt-stated)        CARE PLAN ENTRY (see longitudinal plan of care for additional care plan information)  Current Barriers:  . Care Coordination needs related to compression stockings in a patient with morbid obesity and fluid retention.  Nurse Case Manager Clinical Goal(s):  Marland Kitchen Over the next 14 days, patient will verbalize understanding of plan for compression stockings  Interventions:  . Inter-disciplinary care team collaboration (see longitudinal plan of care) . Evaluation of current treatment plan related to compression stockings and patient's adherence to plan as established by provider. . Advised patient that I will call  [employer] [phone].  Called the company and determined they have what she needs.  Their prices range from 6-20 dollars depending on the material and they will mail them to her.  Patient was given the information to call. . Reviewed medications with patient and discussed importance . Discussed plans with patient for ongoing care management follow up and provided patient with direct contact information for care management team . Reviewed with the patient procedure for putting on compression stockings.  . Patient requested that I send a message for her for aquatic Physical therapy at [facility] in [place].  Message Sent.  Patient Self Care Activities:  . Patient verbalizes understanding of plan to find correct compression stockings . Self administers medications as prescribed . Attends all scheduled provider appointments . Performs ADL's independently . Performs IADL's independently . Calls provider office for new concerns or questions . Unable to independently navigate system for compression stockings  Initial goal documentation        Ms. Defoor was given information about Care Management services today including:   1. Care Management services include personalized support from designated clinical staff supervised by her physician, including individualized plan of care and coordination with other care providers 2. 24/7 contact phone numbers for assistance for urgent and routine care needs. 3. The patient may stop CCM services at any time (effective at the end of the month) by phone call to the office staff.  Patient agreed to services and verbal consent obtained.   The patient verbalized understanding of instructions provided today and declined a print copy of patient instruction materials.   The care management team will reach out to the patient again over the next 14 days.   Juanell Fairly RN, BSN, [facility] Care Management Coordinator [facility] Phone: [phone] Fax: [phone]

## 2020-06-08 NOTE — Chronic Care Management (AMB) (Signed)
Care Management   Initial Visit Note  06/08/2020 Name: Catherine [name] MRN: [id number] DOB: [birth year]   Assessment: Catherine [name] is a 39 y.o. year old female who sees Simmons-Robinson, Tawanna Cooler, MD for primary care. The care management team was consulted for assistance with care management and care coordination needs related to Care Coordination for compression stockings in a patient with morbid obesity and fluid retention.   Review of patient status, including review of consultants reports, relevant laboratory and other test results, and collaboration with appropriate care team members and the patient's provider was performed as part of comprehensive patient evaluation and provision of care management services.    SDOH (Social Determinants of Health) assessments performed: No See Care Plan activities for detailed interventions related to [facility])     Outpatient Encounter Medications as of 06/06/2020  Medication Sig Note  . aspirin-acetaminophen-caffeine (EXCEDRIN MIGRAINE) 250-250-65 MG per tablet Take 2 tablets by mouth 2 (two) times daily as needed for migraine. For headache  06/06/2020: Patient uses as needed  . benzonatate (TESSALON) 100 MG capsule Take 1 capsule (100 mg total) by mouth 2 (two) times daily as needed for cough.   Marland Kitchen buPROPion (WELLBUTRIN SR) 150 MG 12 hr tablet Take 1 tablet (150 mg total) by mouth 2 (two) times daily.   . cetirizine (ZYRTEC) 10 MG tablet Take 10 mg by mouth daily.   . fluticasone (FLONASE) 50 MCG/ACT nasal spray Place 2 sprays into both nostrils daily. 06/06/2020: Patient uses prn  . furosemide (LASIX) 20 MG tablet TAKE 1 TABLET (20 MG TOTAL) BY MOUTH 2 TIMES DAILY.   Marland Kitchen ibuprofen (ADVIL,MOTRIN) 200 MG tablet Take 600-800 mg by mouth every 8 (eight) hours as needed (for pain.).    Marland Kitchen naproxen (EC NAPROSYN) 500 MG EC tablet Take 1 tablet (500 mg total) by mouth 2 (two) times daily with a meal for 14 days.   . pantoprazole (PROTONIX) 40 MG tablet Take 1  tablet (40 mg total) by mouth daily. Take 30 minutes prior to breakfast   . Vitamin D, Ergocalciferol, (DRISDOL) 1.25 MG (50000 UNIT) CAPS capsule Take 1 capsule (50,000 Units total) by mouth every 7 (seven) days.   . cyanocobalamin 100 MCG tablet Take 100 mcg by mouth daily. (Patient not taking: Reported on 06/06/2020)   . diclofenac (CATAFLAM) 50 MG tablet Take 50 mg by mouth 2 (two) times daily at 10 AM and 5 PM. (Patient not taking: Reported on 06/06/2020)   . famotidine (PEPCID) 10 MG tablet Take 1 tablet (10 mg total) by mouth daily. (Patient not taking: Reported on 06/06/2020)   . gabapentin (NEURONTIN) 300 MG capsule TAKE 2 CAPSULES (600 MG TOTAL) BY MOUTH 3 TIMES A DAY (Patient not taking: Reported on 06/06/2020)   . nitrofurantoin, macrocrystal-monohydrate, (MACROBID) 100 MG capsule Take 1 capsule (100 mg total) by mouth 2 (two) times daily. (Patient not taking: Reported on 06/06/2020)    No facility-administered encounter medications on file as of 06/06/2020.    Goals Addressed              This Visit's Progress   .  I need compression stocking made with better material. (pt-stated)        CARE PLAN ENTRY (see longitudinal plan of care for additional care plan information)  Current Barriers:  . Care Coordination needs related to compression stockings in a patient with morbid obesity and fluid retention.  Nurse Case Manager Clinical Goal(s):  Marland Kitchen Over the next 14 days, patient will verbalize  understanding of plan for compression stockings  Interventions:  . Inter-disciplinary care team collaboration (see longitudinal plan of care) . Evaluation of current treatment plan related to compression stockings and patient's adherence to plan as established by provider. . Advised patient that I will call  [employer] [phone].  Called the company and determined they have what she needs.  Their prices range from 6-20 dollars depending on the material and they will mail them to her.   Patient was given the information to call. . Reviewed medications with patient and discussed importance . Discussed plans with patient for ongoing care management follow up and provided patient with direct contact information for care management team . Reviewed with the patient procedure for putting on compression stockings.  . Patient requested that I send a message for her for aquatic Physical therapy at [facility] in [place].  Message Sent.  Patient Self Care Activities:  . Patient verbalizes understanding of plan to find correct compression stockings . Self administers medications as prescribed . Attends all scheduled provider appointments . Performs ADL's independently . Performs IADL's independently . Calls provider office for new concerns or questions . Unable to independently navigate system for compression stockings  Initial goal documentation         Follow up plan:  The care management team will reach out to the patient again over the next 14 days.   Ms. Steffy was given information about Care Management services today including:  1. Care Management services include personalized support from designated clinical staff supervised by a physician, including individualized plan of care and coordination with other care providers 2. 24/7 contact phone numbers for assistance for urgent and routine care needs. 3. The patient may stop Care Management services at any time (effective at the end of the month) by phone call to the office staff.  Patient agreed to services and verbal consent obtained.  Catherine Fairly RN, BSN, [facility] Care Management Coordinator [facility] Phone: [phone] Fax: [phone]

## 2020-06-11 ENCOUNTER — Other Ambulatory Visit: Payer: Self-pay

## 2020-06-11 ENCOUNTER — Ambulatory Visit (INDEPENDENT_AMBULATORY_CARE_PROVIDER_SITE_OTHER): Payer: 59 | Admitting: Family Medicine

## 2020-06-11 ENCOUNTER — Encounter (INDEPENDENT_AMBULATORY_CARE_PROVIDER_SITE_OTHER): Payer: Self-pay | Admitting: Family Medicine

## 2020-06-11 VITALS — BP 112/76 | HR 93 | Temp 97.8°F | Ht 67.0 in | Wt >= 6400 oz

## 2020-06-11 DIAGNOSIS — F3289 Other specified depressive episodes: Secondary | ICD-10-CM

## 2020-06-11 DIAGNOSIS — E559 Vitamin D deficiency, unspecified: Secondary | ICD-10-CM | POA: Diagnosis not present

## 2020-06-11 DIAGNOSIS — Z6841 Body Mass Index (BMI) 40.0 and over, adult: Secondary | ICD-10-CM

## 2020-06-11 MED ORDER — BUPROPION HCL ER (SR) 150 MG PO TB12: 150.0000 mg | ORAL_TABLET | Freq: Two times a day (BID) | ORAL | 0 refills | Status: DC

## 2020-06-11 MED ORDER — VITAMIN D (ERGOCALCIFEROL) 1.25 MG (50000 UNIT) PO CAPS: 50000.0000 [IU] | ORAL_CAPSULE | ORAL | 0 refills | Status: DC

## 2020-06-11 NOTE — Progress Notes (Signed)
Chief Complaint:   OBESITY Catherine [name] is here to discuss her progress with her obesity treatment plan along with follow-up of her obesity related diagnoses. Catherine [name] is on the Category 4 Plan and states she is following her eating plan approximately 95% of the time. Catherine [name] states she is at the gym exercising for 45 minutes 4 times per week.  Today's visit was #: 68 Starting weight: 468 lbs Starting date: 10/27/2016 Today's weight: 453 lbs Today's date: 06/11/2020 Total lbs lost to date: 15 Total lbs lost since last in-office visit: 0  Interim History: Catherine [name] is up 7 lbs today since last OV. Catherine [name] injured her right leg and she is starting physical therapy this week. We switched her to Category 4 from the Lower carb plan. She feels she is getting more food in. She has historically eaten too little food per dietary recall. She denies overeating extra calories or intake of calorie beverages. She is consistently getting in all of the Category 4 food.  Subjective:   1. Vitamin D deficiency Catherine [name]'s last Vit D level was slightly low at 42. She is on weekly prescription Vit D.   2. Other depression, with emotional eating Catherine [name] denies cravings, and her mood is stable.   Assessment/Plan:   1. Vitamin D deficiency  We will refill prescription Vitamin D with a 90 day supply with no refill. Catherine [name] will follow-up for routine testing of Vitamin D, at least 2-3 times per year to avoid over-replacement.  - Vitamin D, Ergocalciferol, (DRISDOL) 1.25 MG (50000 UNIT) CAPS capsule; Take 1 capsule (50,000 Units total) by mouth every 7 (seven) days.  Dispense: 12 capsule; Refill: 0  2. Other depression, with emotional eating Behavior modification techniques were discussed today to help Catherine [name] deal with her emotional/non-hunger eating behaviors. We will refill bupropion with a 90 days supply with no refill. Orders and follow up as documented in patient record.   - buPROPion (WELLBUTRIN SR) 150 MG  12 hr tablet; Take 1 tablet (150 mg total) by mouth 2 (two) times daily.  Dispense: 180 tablet; Refill: 0  3. Class 3 severe obesity with serious comorbidity and body mass index (BMI) greater than or equal to 70 in adult, unspecified obesity type (HCC) Catherine [name] is currently in the action stage of change. As such, her goal is to continue with weight loss efforts. She has agreed to the Category 4 Plan.   Exercise goals: As is.  Behavioral modification strategies: planning for success.  Catherine [name] has agreed to follow-up with our clinic in 3 weeks.   Objective:   Blood pressure 112/76, pulse 93, temperature 97.8 F (36.6 C), height 5\' 7"  (1.702 m), weight (!) 453 lb (205.5 kg), SpO2 94 %. Body mass index is 70.95 kg/m.  General: Cooperative, alert, well developed, in no acute distress. HEENT: Conjunctivae and lids unremarkable. Cardiovascular: Regular rhythm.  Lungs: Normal work of breathing. Neurologic: No focal deficits.   Lab Results  Component Value Date   CREATININE 0.94 03/06/2020   BUN 17 03/06/2020   NA 137 03/06/2020   K 4.5 03/06/2020   CL 104 03/06/2020   CO2 18 (L) 03/06/2020   Lab Results  Component Value Date   ALT 16 03/06/2020   AST 18 03/06/2020   ALKPHOS 68 03/06/2020   BILITOT 0.4 03/06/2020   Lab Results  Component Value Date   HGBA1C 5.4 03/06/2020   HGBA1C 5.5 11/22/2019   HGBA1C 5.4 07/26/2019   HGBA1C 5.5 09/28/2018   HGBA1C 5.4 05/24/2018  Lab Results  Component Value Date   INSULIN 39.2 (H) 03/06/2020   INSULIN 32.5 (H) 11/22/2019   INSULIN 41.7 (H) 07/26/2019   INSULIN 29.7 (H) 09/28/2018   INSULIN 28.3 (H) 05/24/2018   Lab Results  Component Value Date   TSH 2.160 07/26/2019   Lab Results  Component Value Date   CHOL 146 07/26/2019   HDL 46 07/26/2019   LDLCALC 90 07/26/2019   TRIG 45 07/26/2019   CHOLHDL 3.6 11/21/2015   Lab Results  Component Value Date   WBC 11.1 (H) 07/26/2019   HGB 12.9 07/26/2019   HCT 38.1 07/26/2019    MCV 88 07/26/2019   PLT 219 07/26/2019   Lab Results  Component Value Date   IRON 25 (L) 05/18/2017   TIBC 363 05/18/2017   FERRITIN 16 05/18/2017   Attestation Statements:   Reviewed by clinician on day of visit: allergies, medications, problem list, medical history, surgical history, family history, social history, and previous encounter notes.   Trude Mcburney, am acting as Energy manager for [place], FNP-C.  I have reviewed the above documentation for accuracy and completeness, and I agree with the above. -  Jesse Sans, FNP

## 2020-06-18 ENCOUNTER — Telehealth: Payer: 59 | Admitting: Nurse Practitioner

## 2020-06-18 DIAGNOSIS — J00 Acute nasopharyngitis [common cold]: Secondary | ICD-10-CM | POA: Diagnosis not present

## 2020-06-18 MED ORDER — BENZONATATE 100 MG PO CAPS: 100.0000 mg | ORAL_CAPSULE | Freq: Three times a day (TID) | ORAL | 0 refills | Status: DC | PRN

## 2020-06-18 MED ORDER — FLUTICASONE PROPIONATE 50 MCG/ACT NA SUSP
2.0000 | Freq: Every day | NASAL | 6 refills | Status: DC
Start: 2020-06-18 — End: 2021-04-19

## 2020-06-18 NOTE — Progress Notes (Signed)

## 2020-06-26 ENCOUNTER — Ambulatory Visit: Payer: 59

## 2020-06-26 NOTE — Chronic Care Management (AMB) (Signed)
  Care Management   Follow Up Note   06/26/2020 Name: PAMMIE CHIRINO MRN: [id number] DOB: [birth year]  Referred by: Ronnald Ramp, MD Reason for referral : Care Coordination (Compression hose F/U)   SONNI BARSE is a 39 y.o. year old female who is a primary care patient of Simmons-Robinson, Tawanna Cooler, MD. The care management team was consulted for assistance with care management and care coordination needs.    Review of patient status, including review of consultants reports, relevant laboratory and other test results, and collaboration with appropriate care team members and the patient's provider was performed as part of comprehensive patient evaluation and provision of chronic care management services.    SDOH (Social Determinants of Health) assessments performed: No See Care Plan activities for detailed interventions related to [facility])     Advanced Directives: See Care Plan and Vynca application for related entries.   Goals Addressed              This Visit's Progress   .  COMPLETED: I need compression stocking made with better material. (pt-stated)        CARE PLAN ENTRY (see longitudinal plan of care for additional care plan information)  Current Barriers:  . Care Coordination needs related to compression stockings in a patient with morbid obesity and fluid retention.  Nurse Case Manager Clinical Goal(s):  Marland Kitchen Over the next 14 days, patient will verbalize understanding of plan for compression stockings  Interventions:  . Inter-disciplinary care team collaboration (see longitudinal plan of care) . Evaluation of current treatment plan related to compression stockings and patient's adherence to plan as established by provider. . Advised patient that I will call  [employer] [phone].  Called the company and determined they have what she needs.  Their prices range from 6-20 dollars depending on the material and they will mail them to her.  Patient was given  the information to call. . Reviewed medications with patient and discussed importance . Patient states that she was able to call and place her order for the compression stockings.  She received them last Tuesday.  She is pleased with them.  She also has gone to [facility] appointment at [facility] for the initial review and her first session is tomorrow. Marland Kitchen RNCM has no further follow neccessary.  Patient has contact information for future needs. . Discussed plans with patient for ongoing care management follow up and provided patient with direct contact information for care management team . Reviewed with the patient procedure for putting on compression stockings.  . Patient requested that I send a message for her for aquatic Physical therapy at [facility] in [place].  Message Sent.  Patient Self Care Activities:  . Patient verbalizes understanding of plan to find correct compression stockings . Self administers medications as prescribed . Attends all scheduled provider appointments . Performs ADL's independently . Performs IADL's independently . Calls provider office for new concerns or questions . Unable to independently navigate system for compression stockings  Please see past updates related to this goal by clicking on the "Past Updates" button in the selected goal          No further follow up required:   Juanell Fairly RN, BSN, [facility] Care Management Coordinator [facility] Phone: [phone] Fax: [phone]

## 2020-06-26 NOTE — Patient Instructions (Signed)
Visit Information  Goals Addressed              This Visit's Progress   .  COMPLETED: I need compression stocking made with better material. (pt-stated)        CARE PLAN ENTRY (see longitudinal plan of care for additional care plan information)  Current Barriers:  . Care Coordination needs related to compression stockings in a patient with morbid obesity and fluid retention.  Nurse Case Manager Clinical Goal(s):  Marland Kitchen Over the next 14 days, patient will verbalize understanding of plan for compression stockings  Interventions:  . Inter-disciplinary care team collaboration (see longitudinal plan of care) . Evaluation of current treatment plan related to compression stockings and patient's adherence to plan as established by provider. . Advised patient that I will call  [employer] [phone].  Called the company and determined they have what she needs.  Their prices range from 6-20 dollars depending on the material and they will mail them to her.  Patient was given the information to call. . Reviewed medications with patient and discussed importance . Patient states that she was able to call and place her order for the compression stockings.  She received them last Tuesday.  She is pleased with them.  She also has gone to her appointment at [facility] for the initial review and her first session is tomorrow. Marland Kitchen RNCM has no further follow neccessary.  Patient has contact information for future needs. . Discussed plans with patient for ongoing care management follow up and provided patient with direct contact information for care management team . Reviewed with the patient procedure for putting on compression stockings.  . Patient requested that I send a message for her for aquatic Physical therapy at [facility] in [place].  Message Sent.  Patient Self Care Activities:  . Patient verbalizes understanding of plan to find correct compression stockings . Self administers medications as  prescribed . Attends all scheduled provider appointments . Performs ADL's independently . Performs IADL's independently . Calls provider office for new concerns or questions . Unable to independently navigate system for compression stockings  Please see past updates related to this goal by clicking on the "Past Updates" button in the selected goal         Ms. Stephani was given information about Care Management services today including:  1. Care Management services include personalized support from designated clinical staff supervised by her physician, including individualized plan of care and coordination with other care providers 2. 24/7 contact phone numbers for assistance for urgent and routine care needs. 3. The patient may stop CCM services at any time (effective at the end of the month) by phone call to the office staff.  Patient agreed to services and verbal consent obtained.   The patient verbalized understanding of instructions provided today and declined a print copy of patient instruction materials.   No Further follow up required  Juanell Fairly RN, BSN, [facility] Care Management Coordinator [facility] Phone: [phone] Fax: [phone]

## 2020-07-09 ENCOUNTER — Encounter (INDEPENDENT_AMBULATORY_CARE_PROVIDER_SITE_OTHER): Payer: Self-pay | Admitting: Family Medicine

## 2020-07-09 ENCOUNTER — Other Ambulatory Visit: Payer: Self-pay

## 2020-07-09 ENCOUNTER — Ambulatory Visit (INDEPENDENT_AMBULATORY_CARE_PROVIDER_SITE_OTHER): Payer: 59 | Admitting: Family Medicine

## 2020-07-09 VITALS — BP 112/69 | HR 92 | Temp 98.0°F | Ht 67.0 in | Wt >= 6400 oz

## 2020-07-09 DIAGNOSIS — Z6841 Body Mass Index (BMI) 40.0 and over, adult: Secondary | ICD-10-CM

## 2020-07-09 DIAGNOSIS — F3289 Other specified depressive episodes: Secondary | ICD-10-CM

## 2020-07-09 NOTE — Progress Notes (Signed)
Chief Complaint:   OBESITY Catherine [name] is here to discuss her progress with her obesity treatment plan along with follow-up of her obesity related diagnoses. Catherine [name] is on the Category 4 Plan and states she is following her eating plan approximately 90% of the time. Catherine [name] states she is doing water therapy, cardio, and stretching for 45-60 minutes 2-4 times per week.  Today's visit was #: 69 Starting weight: 468 lbs Starting date: 10/27/2016 Today's weight: 449 lbs Today's date: 07/09/2020 Total lbs lost to date: 19 Total lbs lost since last in-office visit: 4  Interim History: Catherine [name] is getting in all of the prescribed food on the plan. Her hunger is satisfied. She is listening to an audio book about emotional eating which she feels has been beneficial.   She is not exceeding extra calories. She is using portioned snack bags.  She is doing water therapy for her knee.  Subjective:   1. Other depression, with emotional eating She is listening to an audio book about emotional eating which she feels has been beneficial. . She is stable on bupropion 150 mg BID. Her blood pressure is within normal limits. She denies insomnia.  Assessment/Plan:   1. Other depression, with emotional eating . Catherine [name] will continue bupropion.   2. Class 3 severe obesity with serious comorbidity and body mass index (BMI) greater than or equal to 70 in adult, unspecified obesity type (HCC) Catherine [name] is currently in the action stage of change. As such, her goal is to continue with weight loss efforts. She has agreed to the Category 4 Plan.   Exercise goals: As is.  Behavioral modification strategies: decreasing simple carbohydrates and planning for success.  Catherine [name] has agreed to follow-up with our clinic in 3 weeks.  Objective:   Blood pressure 112/69, pulse 92, temperature 98 F (36.7 C), temperature source Oral, height 5\' 7"  (1.702 m), weight (!) 449 lb (203.7 kg), SpO2 99 %. Body mass index is 70.32  kg/m.  General: Cooperative, alert, well developed, in no acute distress. HEENT: Conjunctivae and lids unremarkable. Cardiovascular: Regular rhythm.  Lungs: Normal work of breathing. Neurologic: No focal deficits.   Lab Results  Component Value Date   CREATININE 0.94 03/06/2020   BUN 17 03/06/2020   NA 137 03/06/2020   K 4.5 03/06/2020   CL 104 03/06/2020   CO2 18 (L) 03/06/2020   Lab Results  Component Value Date   ALT 16 03/06/2020   AST 18 03/06/2020   ALKPHOS 68 03/06/2020   BILITOT 0.4 03/06/2020   Lab Results  Component Value Date   HGBA1C 5.4 03/06/2020   HGBA1C 5.5 11/22/2019   HGBA1C 5.4 07/26/2019   HGBA1C 5.5 09/28/2018   HGBA1C 5.4 05/24/2018   Lab Results  Component Value Date   INSULIN 39.2 (H) 03/06/2020   INSULIN 32.5 (H) 11/22/2019   INSULIN 41.7 (H) 07/26/2019   INSULIN 29.7 (H) 09/28/2018   INSULIN 28.3 (H) 05/24/2018   Lab Results  Component Value Date   TSH 2.160 07/26/2019   Lab Results  Component Value Date   CHOL 146 07/26/2019   HDL 46 07/26/2019   LDLCALC 90 07/26/2019   TRIG 45 07/26/2019   CHOLHDL 3.6 11/21/2015   Lab Results  Component Value Date   WBC 11.1 (H) 07/26/2019   HGB 12.9 07/26/2019   HCT 38.1 07/26/2019   MCV 88 07/26/2019   PLT 219 07/26/2019   Lab Results  Component Value Date   IRON 25 (L) 05/18/2017  TIBC 363 05/18/2017   FERRITIN 16 05/18/2017   Attestation Statements:   Reviewed by clinician on day of visit: allergies, medications, problem list, medical history, surgical history, family history, social history, and previous encounter notes.   Trude Mcburney, am acting as Energy manager for [place], FNP-C.  I have reviewed the above documentation for accuracy and completeness, and I agree with the above. -  Jesse Sans, FNP

## 2020-07-10 ENCOUNTER — Encounter (INDEPENDENT_AMBULATORY_CARE_PROVIDER_SITE_OTHER): Payer: Self-pay | Admitting: Family Medicine

## 2020-07-23 MED FILL — FUROSEMIDE 20 MG TABS: 20 | 30 days supply | Qty: 60 | Fill #1

## 2020-07-30 ENCOUNTER — Other Ambulatory Visit: Payer: Self-pay

## 2020-07-30 ENCOUNTER — Ambulatory Visit (INDEPENDENT_AMBULATORY_CARE_PROVIDER_SITE_OTHER): Payer: 59 | Admitting: Family Medicine

## 2020-07-30 VITALS — BP 97/64 | HR 88 | Temp 98.7°F | Ht 67.0 in | Wt >= 6400 oz

## 2020-07-30 DIAGNOSIS — Z6841 Body Mass Index (BMI) 40.0 and over, adult: Secondary | ICD-10-CM | POA: Diagnosis not present

## 2020-07-30 DIAGNOSIS — K219 Gastro-esophageal reflux disease without esophagitis: Secondary | ICD-10-CM

## 2020-07-31 ENCOUNTER — Encounter (INDEPENDENT_AMBULATORY_CARE_PROVIDER_SITE_OTHER): Payer: Self-pay | Admitting: Family Medicine

## 2020-07-31 NOTE — Progress Notes (Signed)
Chief Complaint:   OBESITY Catherine [name] is here to discuss her progress with her obesity treatment plan along with follow-up of her obesity related diagnoses. Catherine [name] is on the Category 4 Plan and states she is following her eating plan approximately 70% of the time. Catherine [name] states she is exercising 0 minutes 0 times per week.  Today's visit was #: 71 Starting weight: 468 lbs Starting date: 10/27/2016 Today's weight: 453 lbs Today's date: 07/30/2020 Total lbs lost to date: 15 Total lbs lost since last in-office visit: 0  Interim History: Catherine [name] notes increased GERD symptoms recently and says she has not been eating her dinner due to these symptoms. She denies eating > 300 extra calories. She is taking water therapy for joint pain currently.   Subjective:   Gastroesophageal reflux disease, unspecified whether esophagitis present. Catherine [name] has had worsening symptoms recently with vomiting. She is aware of trigger foods and some episodes have been triggered by tomatoes. She has been taking Protonix 40 mg daily as directed by GI.Marland Kitchen  Assessment/Plan:   Gastroesophageal reflux disease, unspecified whether esophagitis present. Intensive lifestyle modifications are the first line treatment for this issue. We discussed several lifestyle modifications today and she will continue to work on diet, exercise and weight loss efforts. Orders and follow up as documented in patient record. Catherine [name] will work hard to avoid trigger foods. GERD is managed by GI. She will continue Protonix as directed.   Class 3 severe obesity with serious comorbidity and body mass index (BMI) greater than or equal to 70 in adult, unspecified obesity type (HCC).  Catherine [name] is currently in the action stage of change. As such, her goal is to continue with weight loss efforts. She has agreed to the Category 3 Plan. She will work on getting all of the food in.  Exercise goals: All adults should avoid inactivity. Some  physical activity is better than none, and adults who participate in any amount of physical activity gain some health benefits.  Behavioral modification strategies: increasing lean protein intake and no skipping meals.  Catherine [name] has agreed to follow-up with our clinic in 3 weeks.   Objective:   Blood pressure 97/64, pulse 88, temperature 98.7 F (37.1 C), height 5\' 7"  (1.702 m), weight (!) 453 lb (205.5 kg), SpO2 100 %. Body mass index is 70.95 kg/m.  General: Cooperative, alert, well developed, in no acute distress. HEENT: Conjunctivae and lids unremarkable. Cardiovascular: Regular rhythm.  Lungs: Normal work of breathing. Neurologic: No focal deficits.   Lab Results  Component Value Date   CREATININE 0.94 03/06/2020   BUN 17 03/06/2020   NA 137 03/06/2020   K 4.5 03/06/2020   CL 104 03/06/2020   CO2 18 (L) 03/06/2020   Lab Results  Component Value Date   ALT 16 03/06/2020   AST 18 03/06/2020   ALKPHOS 68 03/06/2020   BILITOT 0.4 03/06/2020   Lab Results  Component Value Date   HGBA1C 5.4 03/06/2020   HGBA1C 5.5 11/22/2019   HGBA1C 5.4 07/26/2019   HGBA1C 5.5 09/28/2018   HGBA1C 5.4 05/24/2018   Lab Results  Component Value Date   INSULIN 39.2 (H) 03/06/2020   INSULIN 32.5 (H) 11/22/2019   INSULIN 41.7 (H) 07/26/2019   INSULIN 29.7 (H) 09/28/2018   INSULIN 28.3 (H) 05/24/2018   Lab Results  Component Value Date   TSH 2.160 07/26/2019   Lab Results  Component Value Date   CHOL 146 07/26/2019   HDL 46 07/26/2019  LDLCALC 90 07/26/2019   TRIG 45 07/26/2019   CHOLHDL 3.6 11/21/2015   Lab Results  Component Value Date   WBC 11.1 (H) 07/26/2019   HGB 12.9 07/26/2019   HCT 38.1 07/26/2019   MCV 88 07/26/2019   PLT 219 07/26/2019   Lab Results  Component Value Date   IRON 25 (L) 05/18/2017   TIBC 363 05/18/2017   FERRITIN 16 05/18/2017   Attestation Statements:   Reviewed by clinician on day of visit: allergies, medications, problem list,  medical history, surgical history, family history, social history, and previous encounter notes.  IMarianna Payment, am acting as Energy manager for [place], FNP-C   I have reviewed the above documentation for accuracy and completeness, and I agree with the above. -  Jesse Sans, FNP

## 2020-08-10 ENCOUNTER — Telehealth: Payer: 59 | Admitting: Emergency Medicine

## 2020-08-10 DIAGNOSIS — J329 Chronic sinusitis, unspecified: Secondary | ICD-10-CM

## 2020-08-10 MED ORDER — AMOXICILLIN-POT CLAVULANATE 875-125 MG PO TABS
1.0000 | ORAL_TABLET | Freq: Two times a day (BID) | ORAL | 0 refills | Status: DC
Start: 2020-08-10 — End: 2020-09-11

## 2020-08-10 NOTE — Progress Notes (Signed)
We are sorry that you are not feeling well.  Here is how we plan to help!  **Please do not respond to this message unless you have follow up questions.**  Based on what you have shared with me it looks like you have sinusitis.  Sinusitis is inflammation and infection in the sinus cavities of the head.  Based on your presentation I believe you most likely have Acute Bacterial Sinusitis.  This is an infection caused by bacteria and is treated with antibiotics. I have prescribed Augmentin 875mg /125mg  one tablet twice daily with food, for 7 days. You may use an oral decongestant such as Mucinex D or if you have glaucoma or high blood pressure use plain Mucinex. Saline nasal spray help and can safely be used as often as needed for congestion.  If you develop worsening sinus pain, fever or notice severe headache and vision changes, or if symptoms are not better after completion of antibiotic, please schedule an appointment with a health care provider.    Sinus infections are not as easily transmitted as other respiratory infection, however we still recommend that you avoid close contact with loved ones, especially the very young and elderly.  Remember to wash your hands thoroughly throughout the day as this is the number one way to prevent the spread of infection!  Home Care:  Only take medications as instructed by your medical team.  Complete the entire course of an antibiotic.  Do not take these medications with alcohol.  A steam or ultrasonic humidifier can help congestion.  You can place a towel over your head and breathe in the steam from hot water coming from a faucet.  Avoid close contacts especially the very young and the elderly.  Cover your mouth when you cough or sneeze.  Always remember to wash your hands.  Get Help Right Away If:  You develop worsening fever or sinus pain.  You develop a severe head ache or visual changes.  Your symptoms persist after you have completed your  treatment plan.  Make sure you  Understand these instructions.  Will watch your condition.  Will get help right away if you are not doing well or get worse.  Your e-visit answers were reviewed by a board certified advanced clinical practitioner to complete your personal care plan.  Depending on the condition, your plan could have included both over the counter or prescription medications.  If there is a problem please reply  once you have received a response from your provider.  Your safety is important to .  If you have drug allergies check your prescription carefully.    You can use MyChart to ask questions about today's visit, request a non-urgent call back, or ask for a work or school excuse for 24 hours related to this e-Visit. If it has been greater than 24 hours you will need to follow up with your provider, or enter a new e-Visit to address those concerns.  You will get an e-mail in the next two days asking about your experience.  I hope that your e-visit has been valuable and will speed your recovery. Thank you for using e-visits.    Greater than 5 but less than 10 minutes spent researching, coordinating, and implementing care for this patient today

## 2020-08-14 ENCOUNTER — Telehealth: Payer: Self-pay

## 2020-08-14 NOTE — Telephone Encounter (Signed)
Patient calls nurse line regarding follow up from virtual visit with UC on 08/10/20. Patient was prescribed 7 day course of Augmentin for sinus infection. Patient reports improvement in symptoms, other than still running low-grade fever. Patient report tmax 100.5, with most recent temperature of 100.3.   Patient expresses concerns for continued fever. Advised patient that this is not abnormal when recovering from infection. Per UC visit, patient is to follow up with PCP if still running fever after completion of antibiotics. Agreed with current plan and patient will return call to office if she is still febrile after completion of abx.   Strict ED/ return precautions given. Encouraged continued symptom and fever management with OTC agents, such as tylenol and ibuprofen.   To PCP. Please advise any additional recommendations.   Veronda Prude, RN

## 2020-08-20 ENCOUNTER — Telehealth (INDEPENDENT_AMBULATORY_CARE_PROVIDER_SITE_OTHER): Payer: 59 | Admitting: Family Medicine

## 2020-08-20 VITALS — Ht 67.0 in | Wt >= 6400 oz

## 2020-08-20 DIAGNOSIS — H9202 Otalgia, left ear: Secondary | ICD-10-CM | POA: Diagnosis not present

## 2020-08-20 DIAGNOSIS — Z6841 Body Mass Index (BMI) 40.0 and over, adult: Secondary | ICD-10-CM | POA: Diagnosis not present

## 2020-08-20 DIAGNOSIS — K219 Gastro-esophageal reflux disease without esophagitis: Secondary | ICD-10-CM | POA: Diagnosis not present

## 2020-08-21 NOTE — Progress Notes (Signed)
TeleHealth Visit:  Due to the COVID-19 pandemic, this visit was completed with telemedicine (audio/video) technology to reduce patient and provider exposure as well as to preserve personal protective equipment.   [name] has verbally consented to this TeleHealth visit. The patient is located at home, the provider is located at the [facility] office. The participants in this visit include the listed provider and patient. The visit was conducted today via MyChart video.   Chief Complaint: OBESITY Catherine [name] is here to discuss her progress with her obesity treatment plan along with follow-up of her obesity related diagnoses. Catherine [name] is on the Category 3 Plan and states she is following her eating plan approximately 85% of the time. [name] states she is doing 0 minutes 0 times per week.  Today's visit was #: 72 Starting weight: 468 lbs Starting date: 10/27/2016  Interim History: Catherine [name]'s weight today at home is 451 lbs. She was 453 lbs at her last office visit. She has been ill with a stomach bug and fever. She has been doing better with eating all of the prescribed food over the past 2 weeks. She has been eating Category 3 versus Category 4 due to inability to eat all of the Category 4 food. She has been drinking diet Ginger ale.  Subjective:   1. Otalgia of left ear Catherine [name] notes left ear pain and pressure since her upper respiratory infection and sinusitis began. She is finished with her Augmentin. She has a history of ear infections.  2. Gastroesophageal reflux disease, unspecified whether esophagitis present Catherine [name] reports GERD is better. She is still on Protonix which is working well.  Assessment/Plan:   1. Otalgia of left ear Catherine [name] will continue Flonase daily, and she will continue to follow up as directed.  2. Gastroesophageal reflux disease, unspecified whether esophagitis present .Catherine [name] will continue Protonix, and will continue to work on diet, exercise  and weight loss efforts.     3. Class 3 severe obesity with serious comorbidity and body mass index (BMI) greater than or equal to 70 in adult, unspecified obesity type (HCC) Catherine [name] is currently in the action stage of change. As such, her goal is to continue with weight loss efforts. She has agreed to the Category 3 Plan.   We will recheck fasting labs at her next office visit.  Exercise goals: Catherine [name] will resume water therapy when she is able.  Behavioral modification strategies: increasing lean protein intake, no skipping meals and holiday eating strategies .  Catherine [name] has agreed to follow-up with our clinic in 3 weeks. Objective:   VITALS: Per patient if applicable, see vitals. GENERAL: Alert and in no acute distress. CARDIOPULMONARY: No increased WOB. Speaking in clear sentences.  PSYCH: Pleasant and cooperative. Speech normal rate and rhythm. Affect is appropriate. Insight and judgement are appropriate. Attention is focused, linear, and appropriate.  NEURO: Oriented as arrived to appointment on time with no prompting.   Lab Results  Component Value Date   CREATININE 0.94 03/06/2020   BUN 17 03/06/2020   NA 137 03/06/2020   K 4.5 03/06/2020   CL 104 03/06/2020   CO2 18 (L) 03/06/2020   Lab Results  Component Value Date   ALT 16 03/06/2020   AST 18 03/06/2020   ALKPHOS 68 03/06/2020   BILITOT 0.4 03/06/2020   Lab Results  Component Value Date   HGBA1C 5.4 03/06/2020   HGBA1C 5.5 11/22/2019   HGBA1C 5.4 07/26/2019   HGBA1C 5.5 09/28/2018   HGBA1C 5.4 05/24/2018  Lab Results  Component Value Date   INSULIN 39.2 (H) 03/06/2020   INSULIN 32.5 (H) 11/22/2019   INSULIN 41.7 (H) 07/26/2019   INSULIN 29.7 (H) 09/28/2018   INSULIN 28.3 (H) 05/24/2018   Lab Results  Component Value Date   TSH 2.160 07/26/2019   Lab Results  Component Value Date   CHOL 146 07/26/2019   HDL 46 07/26/2019   LDLCALC 90 07/26/2019   TRIG 45 07/26/2019   CHOLHDL 3.6 11/21/2015    Lab Results  Component Value Date   WBC 11.1 (H) 07/26/2019   HGB 12.9 07/26/2019   HCT 38.1 07/26/2019   MCV 88 07/26/2019   PLT 219 07/26/2019   Lab Results  Component Value Date   IRON 25 (L) 05/18/2017   TIBC 363 05/18/2017   FERRITIN 16 05/18/2017    Attestation Statements:   Reviewed by clinician on day of visit: allergies, medications, problem list, medical history, surgical history, family history, social history, and previous encounter notes.   Trude Mcburney, am acting as Energy manager for [place], FNP-C.  I have reviewed the above documentation for accuracy and completeness, and I agree with the above. - Jesse Sans, FNP

## 2020-08-22 ENCOUNTER — Encounter (INDEPENDENT_AMBULATORY_CARE_PROVIDER_SITE_OTHER): Payer: Self-pay | Admitting: Family Medicine

## 2020-08-28 ENCOUNTER — Other Ambulatory Visit (INDEPENDENT_AMBULATORY_CARE_PROVIDER_SITE_OTHER): Payer: Self-pay | Admitting: Family Medicine

## 2020-08-28 DIAGNOSIS — E559 Vitamin D deficiency, unspecified: Secondary | ICD-10-CM

## 2020-08-28 NOTE — Telephone Encounter (Signed)
This patient was last seen by Adah Salvage, FNP, and currently has an upcoming appt scheduled on 09/11/20 with her.

## 2020-09-03 ENCOUNTER — Other Ambulatory Visit (INDEPENDENT_AMBULATORY_CARE_PROVIDER_SITE_OTHER): Payer: Self-pay | Admitting: Family Medicine

## 2020-09-03 DIAGNOSIS — F3289 Other specified depressive episodes: Secondary | ICD-10-CM

## 2020-09-03 NOTE — Telephone Encounter (Signed)
Refill request. Pt has follow up appoint scheduled for 09/11/20

## 2020-09-11 ENCOUNTER — Encounter (INDEPENDENT_AMBULATORY_CARE_PROVIDER_SITE_OTHER): Payer: Self-pay | Admitting: Family Medicine

## 2020-09-11 ENCOUNTER — Other Ambulatory Visit: Payer: Self-pay

## 2020-09-11 ENCOUNTER — Ambulatory Visit (INDEPENDENT_AMBULATORY_CARE_PROVIDER_SITE_OTHER): Payer: 59 | Admitting: Family Medicine

## 2020-09-11 VITALS — BP 109/74 | HR 102 | Temp 98.6°F | Ht 67.0 in | Wt >= 6400 oz

## 2020-09-11 DIAGNOSIS — F3289 Other specified depressive episodes: Secondary | ICD-10-CM | POA: Diagnosis not present

## 2020-09-11 DIAGNOSIS — E8881 Metabolic syndrome: Secondary | ICD-10-CM | POA: Diagnosis not present

## 2020-09-11 DIAGNOSIS — Z9189 Other specified personal risk factors, not elsewhere classified: Secondary | ICD-10-CM

## 2020-09-11 DIAGNOSIS — E88819 Insulin resistance, unspecified: Secondary | ICD-10-CM

## 2020-09-11 DIAGNOSIS — E559 Vitamin D deficiency, unspecified: Secondary | ICD-10-CM | POA: Diagnosis not present

## 2020-09-11 DIAGNOSIS — Z6841 Body Mass Index (BMI) 40.0 and over, adult: Secondary | ICD-10-CM

## 2020-09-11 MED ORDER — VITAMIN D (ERGOCALCIFEROL) 1.25 MG (50000 UNIT) PO CAPS: 50000.0000 [IU] | ORAL_CAPSULE | ORAL | 0 refills | Status: DC

## 2020-09-11 NOTE — Progress Notes (Addendum)
Chief Complaint:   OBESITY Catherine [name] is here to discuss her progress with her obesity treatment plan along with follow-up of her obesity related diagnoses. Catherine [name] is on the Category 3 Plan and states she is following her eating plan approximately 85% of the time. Catherine [name] states she is doing cardio for 45 minutes 2 times per week.  Today's visit was #: 73 Starting weight: 468 lbs Starting date: 10/27/2016 Today's weight: 457 lbs Today's date: 09/11/2020 Total lbs lost to date: 11 lbs Total lbs lost since last in-office visit: 0  Interim History: Catherine [name] switched back to Category 4 due to excessive hunger.  She is unsure if she is overeating extra calories.  She feels she does not know what she is doing wrong to cause her weight to fluctuate.  In discussion with her over time I have been unable to ascertain what is preventing weight loss. She reports highest weight ever was 490 lbs. She started in our program 10/27/16 at 468 lbs. She lost to 410 lbs by 09/02/18 and has since regained to 457 lbs today. We have had her on low carb, cat 3, cat 4. She has said that journaling makes her depressed. She has been on Saxenda but this was discontinued due to lack of adequate weight loss ([employer]urn[employer] would no longer cover it). We discussed bariatric surgery, and she is open to this in the future. Her wife was in the approval process for bariatric surgery but then discontinued the process. She has also been a patient of ours. Her wife is thinking about resuming the process.   Subjective:   1. Vitamin D deficiency Vitamin D level is slightly low at 47.3.  She is on weekly prescription vitamin D.  2. Insulin resistance Catherine [name] is not on metformin.  She notes some hunger.  Lab Results  Component Value Date   INSULIN 39.2 (H) 03/06/2020   INSULIN 32.5 (H) 11/22/2019   INSULIN 41.7 (H) 07/26/2019   INSULIN 29.7 (H) 09/28/2018   INSULIN 28.3 (H) 05/24/2018   Lab Results  Component Value Date    HGBA1C 5.4 03/06/2020   3. Other depression, with emotional eating Catherine [name] says she does not feel she is struggling with emotional eating.  She is on bupropion 150 mg twice daily.  4. At risk for diabetes mellitus Catherine [name] is at higher than average risk for developing diabetes due to family history, insulin resistance, and obesity.   Assessment/Plan:   1. Vitamin D deficiency Refill vitamin D today, as per below.  Will also check vitamin D level today.  - VITAMIN D 25 Hydroxy (Vit-D Deficiency, Fractures) -Refill Vitamin D, Ergocalciferol, (DRISDOL) 1.25 MG (50000 UNIT) CAPS capsule; Take 1 capsule (50,000 Units total) by mouth every 7 (seven) days.  Dispense: 12 capsule; Refill: 0  2. Insulin resistance Will check A1c, fasting insulin, and glucose today.  - Comprehensive metabolic panel - Hemoglobin A1c - Insulin, random  3. Other depression, with emotional eating Continue bupropion.  Catherine [name] will work on recognizing emotional eating versus hunger.  4. At risk for diabetes mellitus Catherine [name] was given approximately 15 minutes of diabetes education and counseling today. We discussed intensive lifestyle modifications today with an emphasis on weight loss as well as increasing exercise and decreasing simple carbohydrates in her diet.   Repetitive spaced learning was employed today to elicit superior memory formation and behavioral change.  5. Class 3 severe obesity with serious comorbidity and body mass index (BMI) greater than or equal to 70 in  adult, unspecified obesity type (HCC)  Catherine [name] is currently in the action stage of change. As such, her goal is to continue with weight loss efforts. She has agreed to the Category 4 Plan and keeping a food journal and adhering to recommended goals of 1700-1800 calories and 110 grams of protein daily.  She is willing to try journaling for a few weeks and we will reassess at next visit.   Exercise goals: As is.  Behavioral modification  strategies: increasing lean protein intake, decreasing simple carbohydrates and keeping a strict food journal. Handout: emotional eating  Catherine [name] has agreed to follow-up with our clinic in 3 weeks.   In our discussion of bariatric surgery, Catherine [name] was advised that bariatric surgery is a tool for weight loss and will not be successful long-term without significant dietary changes and exercise.  Catherine [name] was informed we would discuss her lab results at her next visit unless there is a critical issue that needs to be addressed sooner. Catherine [name] agreed to keep her next visit at the agreed upon time to discuss these results.  Objective:   Blood pressure 109/74, pulse (!) 102, temperature 98.6 F (37 C), height 5\' 7"  (1.702 m), weight (!) 457 lb (207.3 kg), SpO2 99 %. Body mass index is 71.58 kg/m.  General: Cooperative, alert, well developed, in no acute distress. HEENT: Conjunctivae and lids unremarkable. Cardiovascular: Regular rhythm.  Lungs: Normal work of breathing. Neurologic: No focal deficits.   Lab Results  Component Value Date   CREATININE 0.94 03/06/2020   BUN 17 03/06/2020   NA 137 03/06/2020   K 4.5 03/06/2020   CL 104 03/06/2020   CO2 18 (L) 03/06/2020   Lab Results  Component Value Date   ALT 16 03/06/2020   AST 18 03/06/2020   ALKPHOS 68 03/06/2020   BILITOT 0.4 03/06/2020   Lab Results  Component Value Date   HGBA1C 5.4 03/06/2020   HGBA1C 5.5 11/22/2019   HGBA1C 5.4 07/26/2019   HGBA1C 5.5 09/28/2018   HGBA1C 5.4 05/24/2018   Lab Results  Component Value Date   INSULIN 39.2 (H) 03/06/2020   INSULIN 32.5 (H) 11/22/2019   INSULIN 41.7 (H) 07/26/2019   INSULIN 29.7 (H) 09/28/2018   INSULIN 28.3 (H) 05/24/2018   Lab Results  Component Value Date   TSH 2.160 07/26/2019   Lab Results  Component Value Date   CHOL 146 07/26/2019   HDL 46 07/26/2019   LDLCALC 90 07/26/2019   TRIG 45 07/26/2019   CHOLHDL 3.6 11/21/2015   Lab Results  Component Value  Date   WBC 11.1 (H) 07/26/2019   HGB 12.9 07/26/2019   HCT 38.1 07/26/2019   MCV 88 07/26/2019   PLT 219 07/26/2019   Lab Results  Component Value Date   IRON 25 (L) 05/18/2017   TIBC 363 05/18/2017   FERRITIN 16 05/18/2017   Attestation Statements:   Reviewed by clinician on day of visit: allergies, medications, problem list, medical history, surgical history, family history, social history, and previous encounter notes.  I, 07/18/2017, CMA, am acting as Insurance claims handler for Energy manager, FNP.  I have reviewed the above documentation for accuracy and completeness, and I agree with the above. -  [place], FNP

## 2020-09-12 LAB — COMPREHENSIVE METABOLIC PANEL
ALT: 20 IU/L (ref 0–32)
AST: 21 IU/L (ref 0–40)
Albumin/Globulin Ratio: 1.1 — ABNORMAL LOW (ref 1.2–2.2)
Albumin: 3.8 g/dL (ref 3.8–4.8)
Alkaline Phosphatase: 67 IU/L (ref 44–121)
BUN/Creatinine Ratio: 16 (ref 9–23)
BUN: 15 mg/dL (ref 6–20)
Bilirubin Total: 0.4 mg/dL (ref 0.0–1.2)
CO2: 25 mmol/L (ref 20–29)
Calcium: 9.1 mg/dL (ref 8.7–10.2)
Chloride: 104 mmol/L (ref 96–106)
Creatinine, Ser: 0.92 mg/dL (ref 0.57–1.00)
GFR calc Af Amer: 91 mL/min/{1.73_m2} (ref >59)
GFR calc non Af Amer: 79 mL/min/{1.73_m2} (ref >59)
Globulin, Total: 3.4 g/dL (ref 1.5–4.5)
Glucose: 87 mg/dL (ref 65–99)
Potassium: 4.9 mmol/L (ref 3.5–5.2)
Sodium: 141 mmol/L (ref 134–144)
Total Protein: 7.2 g/dL (ref 6.0–8.5)

## 2020-09-12 LAB — HEMOGLOBIN A1C
Est. average glucose Bld gHb Est-mCnc: 108 mg/dL
Hgb A1c MFr Bld: 5.4 % (ref 4.8–5.6)

## 2020-09-12 LAB — INSULIN, RANDOM: INSULIN: 43.4 u[IU]/mL — ABNORMAL HIGH (ref 2.6–24.9)

## 2020-09-12 LAB — VITAMIN D 25 HYDROXY (VIT D DEFICIENCY, FRACTURES): Vit D, 25-Hydroxy: 47.6 ng/mL (ref 30.0–100.0)

## 2020-09-21 ENCOUNTER — Telehealth: Payer: Self-pay | Admitting: Family Medicine

## 2020-09-21 NOTE — Telephone Encounter (Signed)
[facility] Handicap Placard form dropped off for at front desk for completion.  Verified that patient section of form has been completed.  Last DOS/WCC with PCP was 0813/21.  Placed form in team folder to be completed by clinical staff.  Vilinda Blanks

## 2020-09-24 NOTE — Telephone Encounter (Signed)
Reviewed form and placed in PCP's box for completion.  .Sanae Willetts R Tamber Burtch, CMA  

## 2020-09-25 NOTE — Telephone Encounter (Signed)
Patient contacted and advised of placard ready for pick up. A copy was made for batch scanning as well.

## 2020-09-25 NOTE — Telephone Encounter (Signed)
Formed completed and placed in front office folder.

## 2020-09-27 ENCOUNTER — Other Ambulatory Visit: Payer: Self-pay | Admitting: Nurse Practitioner

## 2020-10-02 ENCOUNTER — Ambulatory Visit (INDEPENDENT_AMBULATORY_CARE_PROVIDER_SITE_OTHER): Payer: 59 | Admitting: Family Medicine

## 2020-10-02 ENCOUNTER — Encounter (INDEPENDENT_AMBULATORY_CARE_PROVIDER_SITE_OTHER): Payer: Self-pay | Admitting: Family Medicine

## 2020-10-02 ENCOUNTER — Other Ambulatory Visit: Payer: Self-pay

## 2020-10-02 VITALS — BP 127/80 | HR 96 | Temp 98.2°F | Ht 67.0 in | Wt >= 6400 oz

## 2020-10-02 DIAGNOSIS — E8881 Metabolic syndrome: Secondary | ICD-10-CM

## 2020-10-02 DIAGNOSIS — E559 Vitamin D deficiency, unspecified: Secondary | ICD-10-CM | POA: Diagnosis not present

## 2020-10-02 DIAGNOSIS — Z6841 Body Mass Index (BMI) 40.0 and over, adult: Secondary | ICD-10-CM

## 2020-10-02 DIAGNOSIS — Z9189 Other specified personal risk factors, not elsewhere classified: Secondary | ICD-10-CM

## 2020-10-02 MED ORDER — WEGOVY 0.25 MG/0.5ML ~~LOC~~ SOAJ: 0.2500 mg | SUBCUTANEOUS | 0 refills | Status: DC

## 2020-10-02 NOTE — Progress Notes (Signed)
Chief Complaint:   OBESITY Catherine [name] is here to discuss her progress with her obesity treatment plan along with follow-up of her obesity related diagnoses. Catherine [name] is on the Category 4 Plan and keeping a food journal and adhering to recommended goals of 1700-1800 calories and 110 grams of protein and states she is following her eating plan approximately 90% of the time. Catherine [name] states she is not exercising regularly Catherine this time.  Today's visit was #: 74 Starting weight: 468 lbs Starting date: 10/27/2016 Today's weight: 453 lbs Today's date: 10/02/2020 Total lbs lost to date: 15 lbs Total lbs lost since last in-office visit: 4 lbs  Interim History: Catherine [name] has been journaling the last few weeks.  She feels that it opened her eyes to how she may have been overeating calories before on the category plan. She has lost 4 lbs today.  She has been meal prepping more.  She endorses polyphagia.  Subjective:   1. Insulin resistance She notes some polyphagia recently.  She did not tolerate metformin due to diarrhea.  Lab Results  Component Value Date   INSULIN 43.4 (H) 09/11/2020   INSULIN 39.2 (H) 03/06/2020   INSULIN 32.5 (H) 11/22/2019   INSULIN 41.7 (H) 07/26/2019   INSULIN 29.7 (H) 09/28/2018   Lab Results  Component Value Date   HGBA1C 5.4 09/11/2020   2. Vitamin D deficiency Vitamin D improved, but still not Catherine goal (47.6).  On prescription vitamin D.  Catherine [name]  Catherine [name] is Catherine risk of side [name] from start of Wegovy (nausea, constipation).   Assessment/Plan:   1. Insulin resistance Discussed labs with patient today.  We will check into [facility].  2. Vitamin D deficiency Discussed labs with patient today.  Continue prescription vitamin D.  Catherine [name]  Catherine [name] was given approximately 15 minutes of drug side effect counseling today.  We discussed side effect possibility and risk versus benefits. Catherine [name] agreed to the medication and  will contact this office if these side [name] are intolerable.  Repetitive spaced learning was employed today to elicit superior memory formation and behavioral change.  3. Class 3 severe obesity with serious comorbidity and body mass index (BMI) greater than or equal to 70 in adult, unspecified obesity type (HCC)  -Start Semaglutide-Weight Management (WEGOVY) 0.25 MG/0.5ML SOAJ; Inject 0.25 mg into the skin once a week.  Dispense: 2 mL; Refill: 0  Catherine [name] denies personal or family history of thyroid cancer, history of pancreatitis, or current cholelithiasis. Catherine [name] was informed of side [name].   Catherine [name] is currently in the action stage of change. As such, her goal is to continue with weight loss efforts. She has agreed to the Category 4 Plan and keeping a food journal and adhering to recommended goals of 1700-1800 calories and 110 grams of protein daily.   Exercise goals: All adults should avoid inactivity. Some physical activity is better than none, and adults who participate in any amount of physical activity gain some health benefits.  Behavioral modification strategies: increasing lean protein intake and decreasing simple carbohydrates.  Catherine [name] has agreed to follow-up with our clinic in 3 weeks.   Objective:   Blood pressure 127/80, pulse 96, temperature 98.2 F (36.8 C), height 5\' 7"  (1.702 m), weight (!) 453 lb (205.5 kg), SpO2 98 %. Body mass index is 70.95 kg/m.  General: Cooperative, alert, well developed, in no acute distress. HEENT: Conjunctivae and lids unremarkable. Cardiovascular: Regular rhythm.  Lungs: Normal work  of breathing. Neurologic: No focal deficits.   Lab Results  Component Value Date   CREATININE 0.92 09/11/2020   BUN 15 09/11/2020   NA 141 09/11/2020   K 4.9 09/11/2020   CL 104 09/11/2020   CO2 25 09/11/2020   Lab Results  Component Value Date   ALT 20 09/11/2020   AST 21 09/11/2020   ALKPHOS 67 09/11/2020   BILITOT 0.4 09/11/2020   Lab  Results  Component Value Date   HGBA1C 5.4 09/11/2020   HGBA1C 5.4 03/06/2020   HGBA1C 5.5 11/22/2019   HGBA1C 5.4 07/26/2019   HGBA1C 5.5 09/28/2018   Lab Results  Component Value Date   INSULIN 43.4 (H) 09/11/2020   INSULIN 39.2 (H) 03/06/2020   INSULIN 32.5 (H) 11/22/2019   INSULIN 41.7 (H) 07/26/2019   INSULIN 29.7 (H) 09/28/2018   Lab Results  Component Value Date   TSH 2.160 07/26/2019   Lab Results  Component Value Date   CHOL 146 07/26/2019   HDL 46 07/26/2019   LDLCALC 90 07/26/2019   TRIG 45 07/26/2019   CHOLHDL 3.6 11/21/2015   Lab Results  Component Value Date   WBC 11.1 (H) 07/26/2019   HGB 12.9 07/26/2019   HCT 38.1 07/26/2019   MCV 88 07/26/2019   PLT 219 07/26/2019   Lab Results  Component Value Date   IRON 25 (L) 05/18/2017   TIBC 363 05/18/2017   FERRITIN 16 05/18/2017   Attestation Statements:   Reviewed by clinician on day of visit: allergies, medications, problem list, medical history, surgical history, family history, social history, and previous encounter notes.  I, Insurance claims handler, CMA, am acting as Energy manager for [place], FNP.  I have reviewed the above documentation for accuracy and completeness, and I agree with the above. -  Jesse Sans, FNP

## 2020-10-19 ENCOUNTER — Telehealth: Payer: 59 | Admitting: Nurse Practitioner

## 2020-10-19 DIAGNOSIS — R399 Unspecified symptoms and signs involving the genitourinary system: Secondary | ICD-10-CM

## 2020-10-19 MED ORDER — CEPHALEXIN 500 MG PO CAPS: 500.0000 mg | ORAL_CAPSULE | Freq: Two times a day (BID) | ORAL | 0 refills | Status: DC

## 2020-10-19 NOTE — Progress Notes (Signed)
We are sorry that you are not feeling well.  Here is how we plan to help!  Based on what you shared with me it looks like you most likely have a simple urinary tract infection.  A UTI (Urinary Tract Infection) is a bacterial infection of the bladder.  Most cases of urinary tract infections are simple to treat but a key part of your care is to encourage you to drink plenty of fluids and watch your symptoms carefully. * we usually do not treat a UTI with associated back pain on an evisit. But since the urgent cares are over run with covid patients, I will go ahead and treat you. If you are no better by Monday, you wil need to see your PCP for a urine culture.  I have prescribed Keflex 500 mg twice a day for 7 days.  Your symptoms should gradually improve. Call us if the burning in your urine worsens, you develop worsening fever, back pain or pelvic pain or if your symptoms do not resolve after completing the antibiotic.  Urinary tract infections can be prevented by drinking plenty of water to keep your body hydrated.  Also be sure when you wipe, wipe from front to back and don't hold it in!  If possible, empty your bladder every 4 hours.  Your e-visit answers were reviewed by a board certified advanced clinical practitioner to complete your personal care plan.  Depending on the condition, your plan could have included both over the counter or prescription medications.  If there is a problem please reply  once you have received a response from your provider.  Your safety is important to [name].  If you have drug allergies check your prescription carefully.    You can use MyChart to ask questions about today's visit, request a non-urgent call back, or ask for a work or school excuse for 24 hours related to this e-Visit. If it has been greater than 24 hours you will need to follow up with your provider, or enter a new e-Visit to address those concerns.   You will get an e-mail in the next two days  asking about your experience.  I hope that your e-visit has been valuable and will speed your recovery. Thank you for using e-visits.  5-10 minutes spent reviewing and documenting in chart.

## 2020-10-25 ENCOUNTER — Telehealth (INDEPENDENT_AMBULATORY_CARE_PROVIDER_SITE_OTHER): Payer: 59 | Admitting: Family Medicine

## 2020-10-25 ENCOUNTER — Encounter (INDEPENDENT_AMBULATORY_CARE_PROVIDER_SITE_OTHER): Payer: Self-pay | Admitting: Family Medicine

## 2020-10-25 ENCOUNTER — Other Ambulatory Visit: Payer: Self-pay

## 2020-10-25 DIAGNOSIS — Z6841 Body Mass Index (BMI) 40.0 and over, adult: Secondary | ICD-10-CM | POA: Diagnosis not present

## 2020-10-25 DIAGNOSIS — E559 Vitamin D deficiency, unspecified: Secondary | ICD-10-CM

## 2020-10-25 DIAGNOSIS — F3289 Other specified depressive episodes: Secondary | ICD-10-CM

## 2020-10-25 MED ORDER — WEGOVY 0.25 MG/0.5ML ~~LOC~~ SOAJ: 0.2500 mg | SUBCUTANEOUS | 0 refills | Status: DC

## 2020-10-27 ENCOUNTER — Other Ambulatory Visit (INDEPENDENT_AMBULATORY_CARE_PROVIDER_SITE_OTHER): Payer: Self-pay | Admitting: Family Medicine

## 2020-10-27 DIAGNOSIS — E66813 Obesity, class 3: Secondary | ICD-10-CM

## 2020-10-29 MED ORDER — WEGOVY 0.25 MG/0.5ML ~~LOC~~ SOAJ: 0.2500 mg | SUBCUTANEOUS | 0 refills | Status: DC

## 2020-10-29 NOTE — Progress Notes (Signed)
TeleHealth Visit:  Due to the COVID-19 pandemic, this visit was completed with telemedicine (audio/video) technology to reduce patient and provider exposure as well as to preserve personal protective equipment.   Catherine [name] has verbally consented to this TeleHealth visit. The patient is located at home, the provider is located at the [employer] [facility] office. The participants in this visit include the listed provider and patient. The visit was conducted today via MyChart video.   Chief Complaint: OBESITY Catherine [name] is here to discuss her progress with her obesity treatment plan along with follow-up of her obesity related diagnoses. Catherine [name] is on the Category 4 Plan or keeping a food journal and adhering to recommended goals of 1700-1800 calories and 110 grams of protein daily and states she is following her eating plan approximately 85% of the time. Catherine [name] states she is doing 0 minutes 0 times per week.  Today's visit was #: 75 Starting weight: 468 lbs Starting date: 10/27/2016  Interim History: Catherine [name] feels she has lost weight, and she reported a weight of 445 lbs today. She has been ill and her daughter has had COVID. She thinks she has had COVID also. She has journaled some. She notes her appetite is down. She is eating around 1800 calories per day. She is working on getting her protein in. She started [facility] and she is tolerating it well.  Subjective:   1. Vitamin D deficiency Kevin's last Vit D level was nearly at goal at 47.6. She is on weekly prescription Vit D.  2. Other depression, with emotional eating Catherine [name] has not taken the bupropion due to being ill and starting [facility]. She denies depression currently. She has told me that journaling has made her depressed previously.  Assessment/Plan:   1. Vitamin D deficiency  Catherine [name] agreed to continue taking prescription Vitamin D 50,000 IU every week and will follow-up for routine testing of Vitamin D, at least 2-3 times per  year to avoid over-replacement.  2. Other depression, with emotional eating  Catherine [name] agreed to restart bupropion.   3. Class 3 severe obesity with serious comorbidity and body mass index (BMI) greater than or equal to 70 in adult, unspecified obesity type (HCC) Catherine [name] is currently in the action stage of change. As such, her goal is to continue with weight loss efforts. She has agreed to keeping a food journal and adhering to recommended goals of 1700-1800 calories and 110 grams of protein daily.   Ccontinue Wegovy at 0.25 mg weekly, and we will refill for 1 month.  - Semaglutide-Weight Management (WEGOVY) 0.25 MG/0.5ML SOAJ; Inject 0.25 mg into the skin once a week.  Dispense: 2 mL; Refill: 0  Exercise goals: No exercise has been prescribed at this time.  Behavioral modification strategies: increasing lean protein intake.  Catherine [name] has agreed to follow-up with our clinic in 3 weeks.   Objective:   VITALS: Per patient if applicable, see vitals. GENERAL: Alert and in no acute distress. CARDIOPULMONARY: No increased WOB. Speaking in clear sentences.  PSYCH: Pleasant and cooperative. Speech normal rate and rhythm. Affect is appropriate. Insight and judgement are appropriate. Attention is focused, linear, and appropriate.  NEURO: Oriented as arrived to appointment on time with no prompting.   Lab Results  Component Value Date   CREATININE 0.92 09/11/2020   BUN 15 09/11/2020   NA 141 09/11/2020   K 4.9 09/11/2020   CL 104 09/11/2020   CO2 25 09/11/2020   Lab Results  Component Value Date   ALT  20 09/11/2020   AST 21 09/11/2020   ALKPHOS 67 09/11/2020   BILITOT 0.4 09/11/2020   Lab Results  Component Value Date   HGBA1C 5.4 09/11/2020   HGBA1C 5.4 03/06/2020   HGBA1C 5.5 11/22/2019   HGBA1C 5.4 07/26/2019   HGBA1C 5.5 09/28/2018   Lab Results  Component Value Date   INSULIN 43.4 (H) 09/11/2020   INSULIN 39.2 (H) 03/06/2020   INSULIN 32.5 (H) 11/22/2019   INSULIN 41.7  (H) 07/26/2019   INSULIN 29.7 (H) 09/28/2018   Lab Results  Component Value Date   TSH 2.160 07/26/2019   Lab Results  Component Value Date   CHOL 146 07/26/2019   HDL 46 07/26/2019   LDLCALC 90 07/26/2019   TRIG 45 07/26/2019   CHOLHDL 3.6 11/21/2015   Lab Results  Component Value Date   WBC 11.1 (H) 07/26/2019   HGB 12.9 07/26/2019   HCT 38.1 07/26/2019   MCV 88 07/26/2019   PLT 219 07/26/2019   Lab Results  Component Value Date   IRON 25 (L) 05/18/2017   TIBC 363 05/18/2017   FERRITIN 16 05/18/2017    Attestation Statements:   Reviewed by clinician on day of visit: allergies, medications, problem list, medical history, surgical history, family history, social history, and previous encounter notes.   Trude Mcburney, am acting as Energy manager for [place], FNP-C.  I have reviewed the above documentation for accuracy and completeness, and I agree with the above. - Jesse Sans, FNP

## 2020-10-29 NOTE — Telephone Encounter (Signed)
Refill request

## 2020-10-30 ENCOUNTER — Encounter (INDEPENDENT_AMBULATORY_CARE_PROVIDER_SITE_OTHER): Payer: Self-pay | Admitting: Family Medicine

## 2020-11-13 ENCOUNTER — Ambulatory Visit (INDEPENDENT_AMBULATORY_CARE_PROVIDER_SITE_OTHER): Payer: 59 | Admitting: Family Medicine

## 2020-11-13 ENCOUNTER — Encounter (INDEPENDENT_AMBULATORY_CARE_PROVIDER_SITE_OTHER): Payer: Self-pay | Admitting: Family Medicine

## 2020-11-13 ENCOUNTER — Other Ambulatory Visit: Payer: Self-pay

## 2020-11-13 VITALS — BP 124/75 | HR 91 | Temp 98.4°F | Ht 67.0 in | Wt >= 6400 oz

## 2020-11-13 DIAGNOSIS — Z9189 Other specified personal risk factors, not elsewhere classified: Secondary | ICD-10-CM

## 2020-11-13 DIAGNOSIS — E8881 Metabolic syndrome: Secondary | ICD-10-CM | POA: Diagnosis not present

## 2020-11-13 DIAGNOSIS — Z6841 Body Mass Index (BMI) 40.0 and over, adult: Secondary | ICD-10-CM | POA: Diagnosis not present

## 2020-11-13 DIAGNOSIS — K219 Gastro-esophageal reflux disease without esophagitis: Secondary | ICD-10-CM | POA: Diagnosis not present

## 2020-11-13 MED ORDER — WEGOVY 0.5 MG/0.5ML ~~LOC~~ SOAJ: 0.5000 mg | SUBCUTANEOUS | 0 refills | Status: DC

## 2020-11-14 ENCOUNTER — Encounter (INDEPENDENT_AMBULATORY_CARE_PROVIDER_SITE_OTHER): Payer: Self-pay | Admitting: Family Medicine

## 2020-11-14 NOTE — Progress Notes (Signed)
Chief Complaint:   OBESITY Catherine [name] is here to discuss her progress with her obesity treatment plan along with follow-up of her obesity related diagnoses. Catherine [name] is on keeping a food journal and adhering to recommended goals of 1700-1800 calories and 110 grams of protein daily and states she is following her eating plan approximately 85-90% of the time. Catherine [name] states she is doing 0 minutes 0 times per week.  Today's visit was #: 76 Starting weight: 468 lbs Starting date: 10/27/2016 Today's weight: 447 lbs Today's date: 11/13/2020 Total lbs lost to date: 21 Total lbs lost since last in-office visit: 6  Interim History: Catherine [name] is tolerating Catherine [name]. She denies eructation, nausea, or constipation. She feels it reduces her appetite and cravings. She notes increased satiety. She is journaling consistently. She is meeting her calorie and exceeding her protein goals.  Subjective:   1. Insulin resistance Catherine [name] denies polyphagia on Catherine [name]. She continues to work on diet and exercise to decrease her risk of diabetes.  Lab Results  Component Value Date   INSULIN 43.4 (H) 09/11/2020   INSULIN 39.2 (H) 03/06/2020   INSULIN 32.5 (H) 11/22/2019   INSULIN 41.7 (H) 07/26/2019   INSULIN 29.7 (H) 09/28/2018   Lab Results  Component Value Date   HGBA1C 5.4 09/11/2020   2. Gastroesophageal reflux disease, unspecified whether esophagitis present Catherine [name]'s symptoms are managed well on Protonix. She is avoiding trigger foods.  3. At risk for constipation Catherine [name] is at increased risk for constipation due to increased Catherine [name]. Catherine [name] denies hard, infrequent stools currently.   Assessment/Plan:   1. Insulin resistance Catherine [name] will continue Catherine [name], and will continue to work on weight loss, exercise, and decreasing simple carbohydrates to help decrease the risk of diabetes.   2. Gastroesophageal reflux disease, unspecified whether esophagitis present  Catherine [name] will continue Protonix, and will  continue to work on diet, exercise and weight loss efforts.   3. At risk for constipation Catherine [name] was given approximately 15 minutes of counseling today regarding prevention of constipation. She was encouraged to increase water and fiber intake.   4. Class 3 severe obesity with serious comorbidity and body mass index (BMI) greater than or equal to 70 in adult, unspecified obesity type (Catherine [name]) Catherine [name] is currently in the action stage of change. As such, her goal is to continue with weight loss efforts. She has agreed to keeping a food journal and adhering to recommended goals of 1700-1800 calories and 110 grams of protein daily.   We discussed various medication options to help Catherine [name] with her weight loss efforts and we both agreed to increase Catherine [name] to 0.5 mg SubQ weekly with no refills.  - Catherine [name] (Catherine [name]) 0.5 MG/0.5ML SOAJ; Inject 0.5 mg into the skin once a week.  Dispense: 2 mL; Refill: 0  Exercise goals: All adults should avoid inactivity. Some physical activity is better than none, and adults who participate in any amount of physical activity gain some health benefits. Catherine [name] will try to go back to [employer] in the next few weeks.  Behavioral modification strategies: meal planning and cooking strategies and planning for success.  Catherine [name] has agreed to follow-up with our clinic in 3 weeks.   Objective:   Blood pressure 124/75, pulse 91, temperature 98.4 F (36.9 C), temperature source Oral, height 5\' 7"  (1.702 m), weight (!) 447 lb (202.8 kg), SpO2 98 %. Body mass index is 70.01 kg/m.  General: Cooperative, alert, well developed, in no acute distress. HEENT: Conjunctivae and lids unremarkable. Cardiovascular:  Regular rhythm.  Lungs: Normal work of breathing. Neurologic: No focal deficits.   Lab Results  Component Value Date   CREATININE 0.92 09/11/2020   BUN 15 09/11/2020   NA 141 09/11/2020   K 4.9 09/11/2020   CL 104 09/11/2020   CO2 25  09/11/2020   Lab Results  Component Value Date   ALT 20 09/11/2020   AST 21 09/11/2020   ALKPHOS 67 09/11/2020   BILITOT 0.4 09/11/2020   Lab Results  Component Value Date   HGBA1C 5.4 09/11/2020   HGBA1C 5.4 03/06/2020   HGBA1C 5.5 11/22/2019   HGBA1C 5.4 07/26/2019   HGBA1C 5.5 09/28/2018   Lab Results  Component Value Date   INSULIN 43.4 (H) 09/11/2020   INSULIN 39.2 (H) 03/06/2020   INSULIN 32.5 (H) 11/22/2019   INSULIN 41.7 (H) 07/26/2019   INSULIN 29.7 (H) 09/28/2018   Lab Results  Component Value Date   TSH 2.160 07/26/2019   Lab Results  Component Value Date   CHOL 146 07/26/2019   HDL 46 07/26/2019   LDLCALC 90 07/26/2019   TRIG 45 07/26/2019   CHOLHDL 3.6 11/21/2015   Lab Results  Component Value Date   WBC 11.1 (H) 07/26/2019   HGB 12.9 07/26/2019   HCT 38.1 07/26/2019   MCV 88 07/26/2019   PLT 219 07/26/2019   Lab Results  Component Value Date   IRON 25 (L) 05/18/2017   TIBC 363 05/18/2017   FERRITIN 16 05/18/2017   Attestation Statements:   Reviewed by clinician on day of visit: allergies, medications, problem list, medical history, surgical history, family history, social history, and previous encounter notes.   Catherine [name], am acting as Energy manager for [place], FNP-C.  I have reviewed the above documentation for accuracy and completeness, and I agree with the above. -  Catherine Sans, FNP

## 2020-11-15 NOTE — Progress Notes (Signed)
    SUBJECTIVE:   CHIEF COMPLAINT / HPI:   Leg pain: Patient presents to clinic today reporting bilateral lower extremity leg pain that started on Saturday, January 29.  She reports that the right side is more painful than the left.  She describes the pain as a sore/burning sensation that is primarily located on the left outer calves bilaterally and goes down towards the front of her shin towards her feet.  She denies any history of traumas, falls.  She reports having an accident back in 2010 and when she fell off of a [employer] but has been in physical therapy for this several times for chronic issues.  She reports that fluid pills helped to relieve some of her pain.  For history of lower extremity swelling (?Lipedema) she wears compression stockings.  Patient was also started on Wegovy about 1 month ago to help with weight loss.  PERTINENT  PMH / PSH:  BMI 70, OSA BiPolar Disorder, Depression   OBJECTIVE:   BP 126/76   Pulse (!) 107   Wt (!) 450 lb (204.1 kg)   SpO2 97%   BMI 70.48 kg/m    Physical exam: General: Well-appearing patient, nontoxic-appearing Respiratory: CTA bilaterally, comfortable work of breathing Cardio: S1-S2 present, no murmurs appreciated, 2+ DP pulses appreciated to bilateral lower extremities Extremities:  no skin changes or ecchymosis appreciated, 1+ nonpitting edema appreciated to bilateral lower extremities; tenderness to palpation appreciated to lateral gastrocs radiating to bilateral anterior tibialis muscles, normal hip, knee, and ankle range of motion bilaterally; 5/5 strength appreciated, normal gait   ASSESSMENT/PLAN:   Peroneal neuropathy Patient with sore/burning sensation concerning for neuropathy of peroneal nerve in bilateral lower extremities.  Could be due to acute swelling versus patient wearing her compression stockings up to the knee compressing on the nerve. -Patient instructed to continue to wear compression stockings to reduce swelling  (she believes swelling makes the pain worse) -May consider changing compression stockings to once that go to mid thigh to relieve pressure from peroneal side -Patient prescribed gabapentin 300 mg to be taken as needed twice daily -Plan to follow-up in 2-4 weeks     Dollene Cleveland, DO [facility]

## 2020-11-16 ENCOUNTER — Encounter: Payer: Self-pay | Admitting: Family Medicine

## 2020-11-16 ENCOUNTER — Other Ambulatory Visit: Payer: Self-pay

## 2020-11-16 ENCOUNTER — Ambulatory Visit (INDEPENDENT_AMBULATORY_CARE_PROVIDER_SITE_OTHER): Payer: 59 | Admitting: Family Medicine

## 2020-11-16 VITALS — BP 126/76 | HR 107 | Wt >= 6400 oz

## 2020-11-16 DIAGNOSIS — G5730 Lesion of lateral popliteal nerve, unspecified lower limb: Secondary | ICD-10-CM | POA: Insufficient documentation

## 2020-11-16 DIAGNOSIS — M79605 Pain in left leg: Secondary | ICD-10-CM

## 2020-11-16 DIAGNOSIS — M79604 Pain in right leg: Secondary | ICD-10-CM | POA: Diagnosis not present

## 2020-11-16 MED ORDER — GABAPENTIN 300 MG PO CAPS: ORAL_CAPSULE | ORAL | 1 refills | Status: DC

## 2020-11-16 NOTE — Assessment & Plan Note (Signed)
Patient with sore/burning sensation concerning for neuropathy of peroneal nerve in bilateral lower extremities.  Could be due to acute swelling versus patient wearing her compression stockings up to the knee compressing on the nerve. -Patient instructed to continue to wear compression stockings to reduce swelling (she believes swelling makes the pain worse) -May consider changing compression stockings to once that go to mid thigh to relieve pressure from peroneal side -Patient prescribed gabapentin 300 mg to be taken as needed twice daily -Plan to follow-up in 2-4 weeks

## 2020-11-16 NOTE — Patient Instructions (Signed)
Thank you for coming in to see Catherine [name] today! Please see below to review our plan for today's visit:  1. Try Gabapentin 1 tablet upwards of twice daily for pain in legs.  2. Continue to monitor, try heat/ice, resting, wear your GOOD compression stockings.  3. If no better, let me know and we can repeat Xrays of each knee with Standing view.   Please call the clinic at [phone] if your symptoms worsen or you have any concerns. It was our pleasure to serve you!   Dr. Peggyann Shoals [facility]

## 2020-12-04 ENCOUNTER — Encounter (INDEPENDENT_AMBULATORY_CARE_PROVIDER_SITE_OTHER): Payer: Self-pay | Admitting: Family Medicine

## 2020-12-04 ENCOUNTER — Ambulatory Visit (INDEPENDENT_AMBULATORY_CARE_PROVIDER_SITE_OTHER): Payer: 59 | Admitting: Family Medicine

## 2020-12-04 ENCOUNTER — Other Ambulatory Visit: Payer: Self-pay

## 2020-12-04 VITALS — BP 107/74 | HR 98 | Temp 98.0°F | Ht 67.0 in | Wt >= 6400 oz

## 2020-12-04 DIAGNOSIS — E559 Vitamin D deficiency, unspecified: Secondary | ICD-10-CM

## 2020-12-04 DIAGNOSIS — Z9189 Other specified personal risk factors, not elsewhere classified: Secondary | ICD-10-CM | POA: Diagnosis not present

## 2020-12-04 DIAGNOSIS — Z6841 Body Mass Index (BMI) 40.0 and over, adult: Secondary | ICD-10-CM

## 2020-12-04 DIAGNOSIS — F3289 Other specified depressive episodes: Secondary | ICD-10-CM

## 2020-12-04 MED ORDER — VITAMIN D (ERGOCALCIFEROL) 1.25 MG (50000 UNIT) PO CAPS: 50000.0000 [IU] | ORAL_CAPSULE | ORAL | 0 refills | Status: DC
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
## 2020-12-05 ENCOUNTER — Encounter (INDEPENDENT_AMBULATORY_CARE_PROVIDER_SITE_OTHER): Payer: Self-pay | Admitting: Family Medicine

## 2020-12-05 NOTE — Progress Notes (Signed)
Chief Complaint:   OBESITY Catherine [name] is here to discuss her progress with her obesity treatment plan along with follow-up of her obesity related diagnoses. Catherine [name] is on keeping a food journal and adhering to recommended goals of 1700-1800 calories and 110 g protein and states she is following her eating plan approximately 85% of the time. Catherine [name] states she is doing 0 minutes 0 times per week.  Today's visit was #: 77 Starting weight: 468 lbs Starting date: 10/27/2016 Today's weight: 448 lbs Today's date: 12/04/2020 Total lbs lost to date: 20 lbs Total lbs lost since last in-office visit:  0 (+1)  Interim History: Catherine [name] notes some personal issues the past few weeks. Her job is in flux and she may be facing unemployment in the next few months. She feels she has been snacking more rather than eating healthy food and real meals. She is not always meeting protein goal. Wegovy dosewas increased at last OV, but she has only had one shot at the higher dose.  Subjective:   1. Vitamin D deficiency Catherine [name]'s Vitamin D level was slightly low at 43.4 on 11/21/2020. She is currently taking prescription vitamin D 50,000 IU each week. She denies nausea, vomiting or muscle weakness.   Ref. Range 11/22/2019 11:00  Vitamin D, 25-Hydroxy Latest Ref Range: 30.0 - 100.0 ng/mL 43.4   2. Other depression, with emotional eating Pt's stress is affecting her food choices. She has not been cooking much. She is on Wellbutrin 150 mg BID. Her mood is stable. She asked to see [name] which we had discussed previously.  3. At risk for malnutrition Catherine [name] is at increased risk for malnutrition due to insufficient protein intake  Assessment/Plan:   1. Vitamin D deficiency  She agrees to continue to take prescription Vitamin D @50 ,000 IU every week and will follow-up for routine testing of Vitamin D, at least 2-3 times per year to avoid over-replacement.  - Vitamin D, Ergocalciferol, (DRISDOL) 1.25 MG (50000  UNIT) CAPS capsule; Take 1 capsule (50,000 Units total) by mouth every 7 (seven) days.  Dispense: 12 capsule; Refill: 0  2. Other depression, with emotional eating  Continue Wellbutrin. Refer to Dr. .  - Ambulatory referral to Psychology  3. At risk for malnutrition Catherine [name] was given approximately 15 minutes of counseling today regarding prevention of malnutrition and ways to meet macronutrient goals..   4. Class 3 severe obesity with serious comorbidity and body mass index (BMI) greater than or equal to 70 in adult, unspecified obesity type (HCC) Catherine [name] is currently in the action stage of change. As such, her goal is to continue with weight loss efforts. She has agreed to keeping a food journal and adhering to recommended goals of 1700-1800 calories and 110 g protein.   Exercise goals: All adults should avoid inactivity. Some physical activity is better than none, and adults who participate in any amount of physical activity gain some health benefits.  Behavioral modification strategies: increasing lean protein intake and meal planning and cooking strategies.  Catherine [name] has agreed to follow-up with our clinic in 3 weeks.   Objective:   Blood pressure 107/74, pulse 98, temperature 98 F (36.7 C), height 5\' 7"  (1.702 m), weight (!) 448 lb (203.2 kg), SpO2 100 %. Body mass index is 70.17 kg/m.  General: Cooperative, alert, well developed, in no acute distress. HEENT: Conjunctivae and lids unremarkable. Cardiovascular: Regular rhythm.  Lungs: Normal work of breathing. Neurologic: No focal deficits.   Lab Results  Component Value  Date   CREATININE 0.92 09/11/2020   BUN 15 09/11/2020   NA 141 09/11/2020   K 4.9 09/11/2020   CL 104 09/11/2020   CO2 25 09/11/2020   Lab Results  Component Value Date   ALT 20 09/11/2020   AST 21 09/11/2020   ALKPHOS 67 09/11/2020   BILITOT 0.4 09/11/2020   Lab Results  Component Value Date   HGBA1C 5.4 09/11/2020   HGBA1C 5.4 03/06/2020    HGBA1C 5.5 11/22/2019   HGBA1C 5.4 07/26/2019   HGBA1C 5.5 09/28/2018   Lab Results  Component Value Date   INSULIN 43.4 (H) 09/11/2020   INSULIN 39.2 (H) 03/06/2020   INSULIN 32.5 (H) 11/22/2019   INSULIN 41.7 (H) 07/26/2019   INSULIN 29.7 (H) 09/28/2018   Lab Results  Component Value Date   TSH 2.160 07/26/2019   Lab Results  Component Value Date   CHOL 146 07/26/2019   HDL 46 07/26/2019   LDLCALC 90 07/26/2019   TRIG 45 07/26/2019   CHOLHDL 3.6 11/21/2015   Lab Results  Component Value Date   WBC 11.1 (H) 07/26/2019   HGB 12.9 07/26/2019   HCT 38.1 07/26/2019   MCV 88 07/26/2019   PLT 219 07/26/2019   Lab Results  Component Value Date   IRON 25 (L) 05/18/2017   TIBC 363 05/18/2017   FERRITIN 16 05/18/2017    Attestation Statements:   Reviewed by clinician on day of visit: allergies, medications, problem list, medical history, surgical history, family history, social history, and previous encounter notes.  Edmund Hilda, am acting as Energy manager for [place], FNP.  I have reviewed the above documentation for accuracy and completeness, and I agree with the above. -  Jesse Sans, FNP
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
## 2020-12-11 ENCOUNTER — Telehealth (INDEPENDENT_AMBULATORY_CARE_PROVIDER_SITE_OTHER): Payer: 59 | Admitting: Psychology

## 2020-12-11 ENCOUNTER — Encounter (INDEPENDENT_AMBULATORY_CARE_PROVIDER_SITE_OTHER): Payer: Self-pay

## 2020-12-13 ENCOUNTER — Telehealth (INDEPENDENT_AMBULATORY_CARE_PROVIDER_SITE_OTHER): Payer: 59 | Admitting: Psychology

## 2020-12-13 DIAGNOSIS — F3289 Other specified depressive episodes: Secondary | ICD-10-CM | POA: Diagnosis not present

## 2020-12-13 DIAGNOSIS — F419 Anxiety disorder, unspecified: Secondary | ICD-10-CM | POA: Diagnosis not present

## 2020-12-13 NOTE — Progress Notes (Signed)
  Office: [phone]  /  Fax: [phone]    Date: December 27, 2020   Appointment Start Time: 8:29am Duration: 26 minutes Provider: Lawerance Cruel, Psy.D. Type of Session: Individual Therapy  Location of Patient: Home Location of Provider: Provider's Home (private office) Type of Contact: Telepsychological Visit via MyChart Video Visit  Session Content: Catherine [name] is a 40 y.o. female presenting for a follow-up appointment to address the previously established treatment goal of increasing coping skills. Today's appointment was a telepsychological visit due to COVID-19. Catherine [name] provided verbal consent for today's telepsychological appointment and she is aware she is responsible for securing confidentiality on her end of the session. Prior to proceeding with today's appointment, Catherine [name]'s physical location at the time of this appointment was obtained as well a phone number she could be reached at in the event of technical difficulties. Catherine [name] and this provider participated in today's telepsychological service.   This provider conducted a brief check-in. Catherine [name] shared, "I'm starting to feel a little bit better," noting she is feeling better physically and emotionally. She also shared she is "putting [herself] on a time frame" for eating. This was explored further and it was reflected that by skipping breakfast, she is likely experiencing increased hunger by lunch. Possible consequences associated with the aforementioned were discussed. As such, she was receptive to eating breakfast. Reviewed emotional and physical hunger as Catherine [name] acknowledged she did not have the chance to look at the previously shared handout. Catherine [name] was receptive to today's appointment as evidenced by openness to sharing, responsiveness to feedback, and willingness to discuss hunger patterns.  Mental Status Examination:  Appearance: well groomed and appropriate hygiene  Behavior: appropriate to circumstances Mood:  euthymic Affect: mood congruent Speech: normal in rate, volume, and tone Eye Contact: appropriate Psychomotor Activity: unable to assess  Gait: unable to assess Thought Process: linear, logical, and goal directed  Thought Content/Perception: denies suicidal and homicidal ideation, plan, and intent and no hallucinations, delusions, bizarre thinking or behavior reported or observed Orientation: time, person, place, and purpose of appointment Memory/Concentration: memory, attention, language, and fund of knowledge intact  Insight/Judgment: good  Interventions:  Conducted a brief chart review Conducted a risk assessment Provided empathic reflections and validation Employed supportive psychotherapy interventions to facilitate reduced distress and to improve coping skills with identified stressors Psychoeducation provided regarding emotional and physical hunger  DSM-5 Diagnosis(es): 311 (F32.8) Other Specified Depressive Disorder, Emotional Eating Behaviors and 300.00 (F41.9) Unspecified Anxiety Disorder  Treatment Goal & Progress: During the initial appointment with this provider, the following treatment goal was established: increase coping skills. Catherine [name] has some demonstrated progress in her goal as evidenced by increased awareness of hunger patterns.   Plan: The next appointment will be scheduled in two weeks, which will be via MyChart Video Visit. The next session will focus on working towards the established treatment goal.
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
## 2020-12-26 ENCOUNTER — Encounter (INDEPENDENT_AMBULATORY_CARE_PROVIDER_SITE_OTHER): Payer: Self-pay | Admitting: Family Medicine

## 2020-12-26 DIAGNOSIS — R632 Polyphagia: Secondary | ICD-10-CM

## 2020-12-26 NOTE — Telephone Encounter (Signed)
Last seen Dawn 

## 2020-12-27 ENCOUNTER — Telehealth (INDEPENDENT_AMBULATORY_CARE_PROVIDER_SITE_OTHER): Payer: 59 | Admitting: Psychology

## 2020-12-27 DIAGNOSIS — F419 Anxiety disorder, unspecified: Secondary | ICD-10-CM

## 2020-12-27 DIAGNOSIS — F3289 Other specified depressive episodes: Secondary | ICD-10-CM | POA: Diagnosis not present

## 2020-12-27 MED ORDER — WEGOVY 1 MG/0.5ML ~~LOC~~ SOAJ: 1.0000 mg | SUBCUTANEOUS | 0 refills | Status: DC

## 2020-12-27 NOTE — Telephone Encounter (Signed)
Dose increase request Next  appt. With you 03/22

## 2020-12-28 ENCOUNTER — Other Ambulatory Visit: Payer: Self-pay

## 2020-12-28 ENCOUNTER — Ambulatory Visit (INDEPENDENT_AMBULATORY_CARE_PROVIDER_SITE_OTHER): Payer: 59

## 2020-12-28 ENCOUNTER — Encounter (HOSPITAL_COMMUNITY): Payer: Self-pay

## 2020-12-28 ENCOUNTER — Ambulatory Visit (HOSPITAL_COMMUNITY)
Admission: EM | Admit: 2020-12-28 | Discharge: 2020-12-28 | Disposition: A | Discharge status: Home / Self Care | Payer: 59 | Attending: Emergency Medicine | Admitting: Emergency Medicine

## 2020-12-28 DIAGNOSIS — M25551 Pain in right hip: Secondary | ICD-10-CM | POA: Diagnosis not present

## 2020-12-28 DIAGNOSIS — R531 Weakness: Secondary | ICD-10-CM

## 2020-12-28 DIAGNOSIS — M25559 Pain in unspecified hip: Secondary | ICD-10-CM

## 2020-12-28 MED ORDER — KETOROLAC TROMETHAMINE 60 MG/2ML IM SOLN
60.0000 mg | Freq: Once | INTRAMUSCULAR | Status: AC
  Administered 2020-12-28: 60 mg via INTRAMUSCULAR

## 2020-12-28 MED ORDER — TIZANIDINE HCL 2 MG PO TABS: 2.0000 mg | ORAL_TABLET | Freq: Every day | ORAL | 0 refills | Status: DC

## 2020-12-28 MED ORDER — PREDNISONE 10 MG (21) PO TBPK: ORAL_TABLET | Freq: Every day | ORAL | 0 refills | Status: DC

## 2020-12-28 MED ORDER — KETOROLAC TROMETHAMINE 60 MG/2ML IM SOLN
INTRAMUSCULAR | Status: AC
  Filled 2020-12-28: qty 2

## 2020-12-28 NOTE — Discharge Instructions (Signed)
Light and regular activity as tolerated.  Course of prednisone as provided as well as muscle relaxer at night (Can cause drowsiness).  Please follow up with orthopedics for long term management and further evaluation

## 2020-12-28 NOTE — ED Provider Notes (Signed)
[facility]    CSN: [id number] Arrival date & time: 12/28/20  1696      History   Chief Complaint Chief Complaint  Patient presents with  . Hip Pain    HPI Catherine [name] is a 40 y.o. female.   Catherine [name] presents with complaints of right hip pain which has been worsening over the past week. Recently was started on gabapentin for shooting pain to bilateral lower legs, which did seem to help. Now  With worsening right pain. Worse with certain movements, such as braking while she is in her car. Raising from sitting to standing, and weight bearing increases pain. No known injury to the hip. No new numbness or tingling. No loss of bladder or bowel. She has tried voltaren gel as well as bayer aspirin which haven't helped. No cardiac history and is not diabetic. History of gastric ulcers. Morbidly obese. She works from home- seated at [employer].     ROS per HPI, negative if not otherwise mentioned.      Past Medical History:  Diagnosis Date  . Allergy   . Anxiety   . Arthritis   . Asthma    since baby, seasonal  . ASTHMA, UNSPECIFIED 12/10/2006   Qualifier: Diagnosis of  By: Abundio Miu    . CELLULITIS AND ABSCESS OF LEG EXCEPT FOOT 12/30/2007   Qualifier: Diagnosis of  By: Daphine Deutscher FNP, Zena Amos    . Cyst of bone    right side of spine  . DEPENDENT EDEMA, LEGS, BILATERAL 11/22/2008   Qualifier: Diagnosis of  By: Barbaraann Barthel MD, [place]    . Depression 2003Dx  . Edema   . Food allergy    citris  . FOOT PAIN, RIGHT 11/22/2008   Qualifier: Diagnosis of  By: Barbaraann Barthel MD, [place]    . Gastric ulcer   . GERD (gastroesophageal reflux disease)   . Gout   . Hypertension    took Norvasc for 3 months, doctor discontinued no longer on BP medications  . Joint pain   . KNEE INJURY, LEFT 01/01/2009   Qualifier: Diagnosis of  By: Delrae Alfred MD, Lanora Manis    . Migraine   . ONYCHOMYCOSIS, TOENAILS 11/22/2008   Qualifier: Diagnosis of  By: Barbaraann Barthel MD, [place]    .  Shortness of breath   . Sleep apnea   . TINEA PEDIS 11/22/2008   Qualifier: Diagnosis of  By: Barbaraann Barthel MD, [place]      Patient Active Problem List   Diagnosis Date Noted  . Pain in both lower extremities 11/16/2020  . Peroneal neuropathy 11/16/2020  . Acute otitis externa of both ears 03/02/2020  . Class 3 severe obesity with serious comorbidity and body mass index (BMI) greater than or equal to 70 in adult (HCC) 06/10/2017  . Fluid retention 06/10/2017  . Menorrhagia 05/18/2017  . Sacral radiculopathy 02/04/2017  . Insulin resistance 02/02/2017  . At risk for violence to self related to depression 02/02/2017  . Morbid obesity (HCC) 01/01/2017  . Sore throat (viral) 11/21/2016  . Vitamin D deficiency 11/12/2016  . Asthma 07/25/2016  . Abdominal pain 06/04/2016  . Otitis, externa, infective 06/04/2016  . Dysuria 03/20/2016  . Gout 11/21/2015  . Obesity, morbid, BMI 50 or higher (HCC) 05/14/2015  . BIPOLAR DISORDER UNSPECIFIED 11/22/2008  . Depression 12/10/2006  . MIGRAINE, UNSPEC., W/O INTRACTABLE MIGRAINE 12/10/2006  . GASTROESOPHAGEAL REFLUX, NO ESOPHAGITIS 12/10/2006  . OSA (obstructive sleep apnea) 12/10/2006    Past Surgical History:  Procedure Laterality Date  .  DILITATION & CURRETTAGE/HYSTROSCOPY WITH NOVASURE ABLATION N/A 07/03/2017   Procedure: DILATATION & CURETTAGE/HYSTEROSCOPY WITH NOVASURE ABLATION;  Surgeon: Genia Del, MD;  Location: [facility];  Service: Gynecology;  Laterality: N/A;  . UPPER GI ENDOSCOPY    . WISDOM TOOTH EXTRACTION  2008   x4    OB History    Gravida  0   Para  0   Term  0   Preterm      AB      Living        SAB      IAB      Ectopic      Multiple      Live Births               Home Medications    Prior to Admission medications   Medication Sig Start Date End Date Taking? Authorizing Provider  predniSONE (STERAPRED UNI-PAK 21 TAB) 10 MG (21) TBPK tablet Take by mouth daily. Per box instruction 12/28/20   Yes Antionetta Ator, Dorene Grebe B, NP  tiZANidine (ZANAFLEX) 2 MG tablet Take 1-2 tablets (2-4 mg total) by mouth at bedtime. 12/28/20  Yes Georgetta Haber, NP  aspirin-acetaminophen-caffeine (EXCEDRIN MIGRAINE) [phone] MG per tablet Take 2 tablets by mouth 2 (two) times daily as needed for migraine. For headache    [provider]  benzonatate (TESSALON PERLES) 100 MG capsule Take 1 capsule (100 mg total) by mouth 3 (three) times daily as needed. 06/18/20   Daphine Deutscher, Mary-Margaret, FNP  buPROPion [facility] SR) 150 MG 12 hr tablet TAKE 1 TABLET BY MOUTH TWICE A DAY 09/03/20   Whitmire, Alvis Lemmings W, FNP  cetirizine (ZYRTEC) 10 MG tablet Take 10 mg by mouth daily.    [provider]  cyanocobalamin 100 MCG tablet Take 100 mcg by mouth daily.     [provider]  diclofenac (CATAFLAM) 50 MG tablet Take 50 mg by mouth 2 (two) times daily at 10 AM and 5 PM.     [provider]  famotidine (PEPCID) 10 MG tablet Take 1 tablet (10 mg total) by mouth daily. 08/20/18   Lockamy, Marcial Pacas, DO  fluticasone (FLONASE) 50 MCG/ACT nasal spray Place 2 sprays into both nostrils daily. 06/18/20   Daphine Deutscher, Mary-Margaret, FNP  furosemide (LASIX) 20 MG tablet TAKE 1 TABLET (20 MG TOTAL) BY MOUTH 2 TIMES DAILY. 02/17/20   Arlyce Harman, DO  gabapentin (NEURONTIN) 300 MG capsule TAKE 2 CAPSULES (600 MG TOTAL) BY MOUTH 3 TIMES A DAY 11/16/20   Peggyann Shoals C, DO  ibuprofen (ADVIL,MOTRIN) 200 MG tablet Take 600-800 mg by mouth every 8 (eight) hours as needed (for pain.).     [provider]  nitrofurantoin, macrocrystal-monohydrate, (MACROBID) 100 MG capsule Take 1 capsule (100 mg total) by mouth 2 (two) times daily. 03/06/20   McVey, Madelaine Bhat, PA-C  pantoprazole (PROTONIX) 40 MG tablet TAKE 1 TABLET (40 MG TOTAL) BY MOUTH DAILY. TAKE 30 MINUTES PRIOR TO BREAKFAST 09/27/20   Meredith Pel, NP  Semaglutide-Weight Management (WEGOVY) 1 MG/0.5ML SOAJ Inject 1 mg into the skin once a week.  12/27/20   Whitmire, Thermon Leyland, FNP  Vitamin D, Ergocalciferol, (DRISDOL) 1.25 MG (50000 UNIT) CAPS capsule Take 1 capsule (50,000 Units total) by mouth every 7 (seven) days. 12/04/20   Whitmire, Thermon Leyland, FNP    Family History Family History  Problem Relation Age of Onset  . Hypertension Mother   . Hyperlipidemia Mother   . Cancer Mother  bone cancer  . Heart disease Mother   . Sudden death Mother   . Stroke Mother   . Thyroid disease Mother   . Sleep apnea Mother   . Obesity Mother   . Sudden death Father   . Hypertension Father   . Heart disease Father   . Hyperlipidemia Father   . Prostate cancer Father   . Lung cancer Father   . Obesity Father   . Alcoholism Father   . Sudden death Brother   . Hypertension Brother   . Hyperlipidemia Brother   . Heart attack Brother   . Heart disease Brother   . Stroke Brother   . Alcoholism Brother   . Drug abuse Brother   . Heart disease Sister   . Hypertension Sister   . Thyroid cancer Sister   . Diabetes Neg Hx   . Colon cancer Neg Hx   . Stomach cancer Neg Hx   . Esophageal cancer Neg Hx   . Rectal cancer Neg Hx   . Liver cancer Neg Hx     Social History Social History   Tobacco Use  . Smoking status: Former Smoker    Packs/day: 0.50    Years: 18.00    Pack years: 9.00    Types: Cigarettes    Start date: 10/13/1996    Quit date: 01/12/2015    Years since quitting: 5.9  . Smokeless tobacco: Never Used  Vaping Use  . Vaping Use: Former  Substance Use Topics  . Alcohol use: Yes    Alcohol/week: 1.0 standard drink    Types: 1 Standard drinks or equivalent per week    Comment: rarely drinks  . Drug use: No    Comment: marjuana - former use, quit 4 years ago     Allergies   Citrus   Review of Systems Review of Systems   Physical Exam Triage Vital Signs ED Triage Vitals  Enc Vitals Group     BP 12/28/20 0950 122/82     Pulse Rate 12/28/20 0950 99     Resp 12/28/20 0950 (!) 22     Temp 12/28/20 0950 98.2  F (36.8 C)     Temp src --      SpO2 12/28/20 0950 97 %     Weight --      Height --      Head Circumference --      Peak Flow --      Pain Score 12/28/20 0948 10     Pain Loc --      Pain Edu? --      Excl. in GC? --    No data found.  Updated Vital Signs BP 122/82   Pulse 99   Temp 98.2 F (36.8 C)   Resp (!) 22   SpO2 97%    Physical Exam Constitutional:      General: She is not in acute distress.    Appearance: She is well-developed.  Cardiovascular:     Rate and Rhythm: Normal rate.  Pulmonary:     Effort: Pulmonary effort is normal.  Musculoskeletal:     Lumbar back: Tenderness present. No bony tenderness. Negative right straight leg raise test and negative left straight leg raise test.     Right hip: Tenderness present. No bony tenderness. Normal range of motion.     Comments: Right lateral hip and proximal thigh tenderness on palpation with pain with right hip flexion; no pain with abduction or adduction; no pain with  straight leg raise; pain noted with weight bearing and noted altered gait; strength equal bilaterally; gross sensation intact to lower extremities   Skin:    General: Skin is warm and dry.  Neurological:     Mental Status: She is alert and oriented to person, place, and time.      UC Treatments / Results  Labs (all labs ordered are listed, but only abnormal results are displayed) Labs Reviewed - No data to display  EKG   Radiology DG Hip Unilat With Pelvis 2-3 Views Right  Result Date: 12/28/2020 CLINICAL DATA:  40 year old female with persistent right hip pain, weakness in the leg with standing. Large body habitus. EXAM: DG HIP (WITH OR WITHOUT PELVIS) 2-3V RIGHT COMPARISON:  None. FINDINGS: Despite maximum radiographic technique bone detail is limited at the pelvis and right hip. The femoral heads are normally located. The proximal right femur appears intact. No pelvis fracture is evident. SI joints appear symmetric. Normal visible bowel  gas pattern. IMPRESSION: Limited bone detail despite maximum radiographic technique. No acute fracture or dislocation identified about the right hip. Electronically Signed   By: Odessa FlemingH  Hall M.D.   On: 12/28/2020 10:48    Procedures Procedures (including critical care time)  Medications Ordered in UC Medications  ketorolac (TORADOL) injection 60 mg (60 mg Intramuscular Given 12/28/20 1056)    Initial Impression / Assessment and Plan / UC Course  I have reviewed the triage vital signs and the nursing notes.  Pertinent labs & imaging results that were available during my care of the patient were reviewed by me and considered in my medical decision making (see chart for details).     Plain film limited exposure unfortunately but no obvious abnormality. No known injury. History of radiculopathy and is on gabapentin, low back still may be contributing to symptoms. Tendonitis considered based on location and triggers of pain, as well. History of gastric ulcer, provided toradol x1 and course of prednisone, encouraged to take with food, tizandine qhs. Follow up with ortho recommended and return precautions provided. Patient verbalized understanding and agreeable to plan.  Wheel chair assist provided out of clinic and ambulatory to her vehicle.  Final Clinical Impressions(s) / UC Diagnoses   Final diagnoses:  Hip pain  Right hip pain     Discharge Instructions     Light and regular activity as tolerated.  Course of prednisone as provided as well as muscle relaxer at night (Can cause drowsiness).  Please follow up with orthopedics for long term management and further evaluation     ED Prescriptions    Medication Sig Dispense Auth. Provider   predniSONE (STERAPRED UNI-PAK 21 TAB) 10 MG (21) TBPK tablet Take by mouth daily. Per box instruction 21 tablet Nurah Petrides B, NP   tiZANidine (ZANAFLEX) 2 MG tablet Take 1-2 tablets (2-4 mg total) by mouth at bedtime. 20 tablet Georgetta HaberBurky, Liann Spaeth B, NP      PDMP not reviewed this encounter.   Georgetta HaberBurky, Cyndia Degraff B, NP 12/28/20 1116

## 2020-12-28 NOTE — ED Triage Notes (Signed)
Pt in with c/o sharp right hip/ thigh pain that has been going on for over 1 month.  Also c/o weakness in right leg when she stands up

## 2020-12-31 ENCOUNTER — Ambulatory Visit (INDEPENDENT_AMBULATORY_CARE_PROVIDER_SITE_OTHER): Payer: 59 | Admitting: Family Medicine

## 2020-12-31 ENCOUNTER — Encounter (INDEPENDENT_AMBULATORY_CARE_PROVIDER_SITE_OTHER): Payer: Self-pay | Admitting: Family Medicine

## 2020-12-31 ENCOUNTER — Other Ambulatory Visit: Payer: Self-pay

## 2020-12-31 VITALS — BP 143/75 | HR 80 | Temp 98.2°F | Ht 67.0 in | Wt >= 6400 oz

## 2020-12-31 DIAGNOSIS — Z6841 Body Mass Index (BMI) 40.0 and over, adult: Secondary | ICD-10-CM

## 2020-12-31 DIAGNOSIS — F3289 Other specified depressive episodes: Secondary | ICD-10-CM

## 2021-01-01 ENCOUNTER — Encounter: Payer: Self-pay | Admitting: Student in an Organized Health Care Education/Training Program

## 2021-01-01 ENCOUNTER — Other Ambulatory Visit: Payer: Self-pay

## 2021-01-01 ENCOUNTER — Ambulatory Visit (INDEPENDENT_AMBULATORY_CARE_PROVIDER_SITE_OTHER): Payer: 59 | Admitting: Student in an Organized Health Care Education/Training Program

## 2021-01-01 DIAGNOSIS — M719 Bursopathy, unspecified: Secondary | ICD-10-CM | POA: Insufficient documentation

## 2021-01-01 DIAGNOSIS — M7061 Trochanteric bursitis, right hip: Secondary | ICD-10-CM

## 2021-01-01 MED ORDER — TIZANIDINE HCL 4 MG PO TABS: 4.0000 mg | ORAL_TABLET | Freq: Every evening | ORAL | 0 refills | Status: DC | PRN

## 2021-01-01 NOTE — Patient Instructions (Signed)
It was a pleasure to see you today!  To summarize our discussion for this visit:  You have trochanteric bursitis. I would recommend finishing out your steroids and using voltaren gel/ice/rest on that location.   If not responding by about a week, we can consider a steroid injection.  Additionally, below are some rehab exercises you can try to prevent reoccurrence   Some additional health maintenance measures we should update are: Health Maintenance Due  Topic Date Due  . Hepatitis C Screening  Never done  . COVID-19 Vaccine (1) Never done  . HIV Screening  Never done  . TETANUS/TDAP  10/14/2011  . PAP SMEAR-Modifier  02/15/2019  .    Call the clinic at [phone] if your symptoms worsen or you have any concerns.   Thank you for allowing me to take part in your care,  Dr. Jamelle Rushing   Hip Bursitis Rehab Ask your health care provider which exercises are safe for you. Do exercises exactly as told by your health care provider and adjust them as directed. It is normal to feel mild stretching, pulling, tightness, or discomfort as you do these exercises. Stop right away if you feel sudden pain or your pain gets worse. Do not begin these exercises until told by your health care provider. Stretching exercise This exercise warms up your muscles and joints and improves the movement and flexibility of your hip. This exercise also helps to relieve pain and stiffness. Iliotibial band stretch An iliotibial band is a strong band of muscle tissue that runs from the outer side of your hip to the outer side of your thigh and knee. 1. Lie on your side with your left / right leg in the top position. 2. Bend your left / right knee and grab your ankle. Stretch out your bottom arm to help you balance. 3. Slowly bring your knee back so your thigh is behind your body. 4. Slowly lower your knee toward the floor until you feel a gentle stretch on the outside of your left / right thigh. If you do not  feel a stretch and your knee will not fall farther, place the heel of your other foot on top of your knee and pull your knee down toward the floor with your foot. 5. Hold this position for __________ seconds. 6. Slowly return to the starting position. Repeat __________ times. Complete this exercise __________ times a day.   Strengthening exercises These exercises build strength and endurance in your hip and pelvis. Endurance is the ability to use your muscles for a long time, even after they get tired. Bridge This exercise strengthens the muscles that move your thigh backward (hip extensors). 1. Lie on your back on a firm surface with your knees bent and your feet flat on the floor. 2. Tighten your buttocks muscles and lift your buttocks off the floor until your trunk is level with your thighs. ? Do not arch your back. ? You should feel the muscles working in your buttocks and the back of your thighs. If you do not feel these muscles, slide your feet 1-2 inches (2.5-5 cm) farther away from your buttocks. ? If this exercise is too easy, try doing it with your arms crossed over your chest. 3. Hold this position for __________ seconds. 4. Slowly lower your hips to the starting position. 5. Let your muscles relax completely after each repetition. Repeat __________ times. Complete this exercise __________ times a day.   Squats This exercise strengthens the muscles  in front of your thigh and knee (quadriceps). 1. Stand in front of a table, with your feet and knees pointing straight ahead. You may rest your hands on the table for balance but not for support. 2. Slowly bend your knees and lower your hips like you are going to sit in a chair. ? Keep your weight over your heels, not over your toes. ? Keep your lower legs upright so they are parallel with the table legs. ? Do not let your hips go lower than your knees. ? Do not bend lower than told by your health care provider. ? If your hip pain  increases, do not bend as low. 3. Hold the squat position for __________ seconds. 4. Slowly push with your legs to return to standing. Do not use your hands to pull yourself to standing. Repeat __________ times. Complete this exercise __________ times a day. Hip hike 1. Stand sideways on a bottom step. Stand on your left / right leg with your other foot unsupported next to the step. You can hold on to the railing or wall for balance if needed. 2. Keep your knees straight and your torso square. Then lift your left / right hip up toward the ceiling. 3. Hold this position for __________ seconds. 4. Slowly let your left / right hip lower toward the floor, past the starting position. Your foot should get closer to the floor. Do not lean or bend your knees. Repeat __________ times. Complete this exercise __________ times a day. Single leg stand 1. Without shoes, stand near a railing or in a doorway. You may hold on to the railing or door frame as needed for balance. 2. Squeeze your left / right buttock muscles, then lift up your other foot. ? Do not let your left / right hip push out to the side. ? It is helpful to stand in front of a mirror for this exercise so you can watch your hip. 3. Hold this position for __________ seconds. Repeat __________ times. Complete this exercise __________ times a day. This information is not intended to replace advice given to you by your health care provider. Make sure you discuss any questions you have with your health care provider. Document Revised: 01/24/2019 Document Reviewed: 01/24/2019 Elsevier Patient Education  2021 [employer].

## 2021-01-01 NOTE — Assessment & Plan Note (Signed)
7/10 tender, achy, deep pain, worse with femur abduction localized to right trochanter without radiation or neuropathy.   Plan: Continue current PO Prednisone course, then will re-evaluate pain and possibility of steroid injections. Advised Voltaren gel application, rest and ice. Provided light rehabilitation instructions Refilled Zanaflex prescription for at night use

## 2021-01-01 NOTE — Progress Notes (Signed)
Chief Complaint:   OBESITY Catherine [name] is here to discuss her progress with her obesity treatment plan along with follow-up of her obesity related diagnoses. Catherine [name] is on keeping a food journal and adhering to recommended goals of 1700-1800 calories and 110 grams of protein and states she is following her eating plan approximately 90% of the time. Catherine [name] states she is doing cardio for 30 minutes once a week.  Today's visit was #: 78 Starting weight: 468 lbs Starting date: 10/27/2016 Today's weight: 446 lbs Today's date: 12/31/2020 Total lbs lost to date: 22 lbs Total lbs lost since last in-office visit: 4 lbs  Interim History: Catherine [name] says that journaling is still working well for her.  She is meeting the protein goal.  She does occasionally go over on calories by 25-50 calories per day.  She is pre-journaling when she meal plans.  Water intake is adequate.  She is on Wegovy 1 mg.  Appetite is well controlled.  She is having right hip pain that is preventing exercise.  Subjective:   1. Other Specified Depressive Disorder, Emotional Eating Behavior Mood stable.  She is seeing [name] and notes she better understands what constitutes emotional eating.  She is also going to start seeing an outside counselor per referral from [name].  Assessment/Plan:   1. Other Specified Depressive Disorder, Emotional Eating Behavior Continue Wellbutrin.  Continue with counseling.   2. Class 3 severe obesity with serious comorbidity-BM 55  Catherine [name] is currently in the action stage of change. As such, her goal is to continue with weight loss efforts. She has agreed to keeping a food journal and adhering to recommended goals of 1700-1800 calories and 110 grams of protein.   She will continue Wegovy 1 mg subcutaneously weekly.  Handout provided:  Recipes.  Exercise goals: All adults should avoid inactivity. Some physical activity is better than none, and adults who participate in any amount of  physical activity gain some health benefits.  Behavioral modification strategies: decreasing simple carbohydrates and meal planning and cooking strategies.  Catherine [name] has agreed to follow-up with our clinic in 3 weeks.   Objective:   Blood pressure (!) 143/75, pulse 80, temperature 98.2 F (36.8 C), height 5\' 7"  (1.702 m), weight (!) 446 lb (202.3 kg), SpO2 98 %. Body mass index is 69.85 kg/m.  General: Cooperative, alert, well developed, in no acute distress. HEENT: Conjunctivae and lids unremarkable. Cardiovascular: Regular rhythm.  Lungs: Normal work of breathing. Neurologic: No focal deficits.   Lab Results  Component Value Date   CREATININE 0.92 09/11/2020   BUN 15 09/11/2020   NA 141 09/11/2020   K 4.9 09/11/2020   CL 104 09/11/2020   CO2 25 09/11/2020   Lab Results  Component Value Date   ALT 20 09/11/2020   AST 21 09/11/2020   ALKPHOS 67 09/11/2020   BILITOT 0.4 09/11/2020   Lab Results  Component Value Date   HGBA1C 5.4 09/11/2020   HGBA1C 5.4 03/06/2020   HGBA1C 5.5 11/22/2019   HGBA1C 5.4 07/26/2019   HGBA1C 5.5 09/28/2018   Lab Results  Component Value Date   INSULIN 43.4 (H) 09/11/2020   INSULIN 39.2 (H) 03/06/2020   INSULIN 32.5 (H) 11/22/2019   INSULIN 41.7 (H) 07/26/2019   INSULIN 29.7 (H) 09/28/2018   Lab Results  Component Value Date   TSH 2.160 07/26/2019   Lab Results  Component Value Date   CHOL 146 07/26/2019   HDL 46 07/26/2019   LDLCALC 90  07/26/2019   TRIG 45 07/26/2019   CHOLHDL 3.6 11/21/2015   Lab Results  Component Value Date   WBC 11.1 (H) 07/26/2019   HGB 12.9 07/26/2019   HCT 38.1 07/26/2019   MCV 88 07/26/2019   PLT 219 07/26/2019   Lab Results  Component Value Date   IRON 25 (L) 05/18/2017   TIBC 363 05/18/2017   FERRITIN 16 05/18/2017   Attestation Statements:   Reviewed by clinician on day of visit: allergies, medications, problem list, medical history, surgical history, family history, social history,  and previous encounter notes.  I, Insurance claims handler, CMA, am acting as Energy manager for [place], FNP.  I have reviewed the above documentation for accuracy and completeness, and I agree with the above. -  Jesse Sans, FNP

## 2021-01-01 NOTE — Progress Notes (Signed)
    SUBJECTIVE:   CHIEF COMPLAINT / HPI: Upper Thigh Pain  Catherine [name] presents today with concerns of upper thigh pain which began 1.5 weeks ago. She describes the pain as 7/10, deep achy, worse with abduction, and worse with pressure when lying on her right side. The pain has also caused her to limp. Localizes pain to her right trochanter and denies radiation to hip, or tingling/numbness in her right lower extremity. She denies spine pain. Denies recent trauma or falls. She eats a well balanced diet and takes Vitamin D weekly. She has been able to sleep at night with 1-2 Zanaflex at night.  She presented to the Urgent Care a few days ago on 12/28/20, where she had negative XR imaging, but received a Toradol shot which alleviated her pain. She also received a 10 day course of PO Prednisone. She also tried Gabapentin which was not helpful.  PERTINENT  PMH / PSH:  Sacral Radiculopathy Peroneal neuropathy Morbid Obesity  OBJECTIVE:   BP 128/72   Pulse 97   Ht 5\' 7"  (1.702 m)   Wt (!) 445 lb (201.9 kg)   SpO2 98%   BMI 69.70 kg/m   Physical Exam Constitutional:      Appearance: She is obese.  HENT:     Head: Normocephalic and atraumatic.  Cardiovascular:     Rate and Rhythm: Normal rate and regular rhythm.     Pulses: Normal pulses.     Heart sounds: Normal heart sounds.  Pulmonary:     Effort: Pulmonary effort is normal.     Breath sounds: Normal breath sounds.  Musculoskeletal:     Cervical back: No rigidity or tenderness.     Right upper leg: Tenderness (Trochanter) present.     Comments: Pain with femur abduction. No pain with internal or external hip rotation.   Skin:    General: Skin is warm and dry.  Neurological:     General: No focal deficit present.     Mental Status: She is alert and oriented to person, place, and time.      ASSESSMENT/PLAN:   Bursitis 7/10 tender, achy, deep pain, worse with femur abduction localized to right trochanter without  radiation or neuropathy.   Plan: Continue current PO Prednisone course, then will re-evaluate pain and possibility of steroid injections. Advised Voltaren gel application, rest and ice. Provided light rehabilitation instructions Refilled Zanaflex prescription for at night use      , Medical Student [facility]

## 2021-01-02 ENCOUNTER — Encounter (INDEPENDENT_AMBULATORY_CARE_PROVIDER_SITE_OTHER): Payer: Self-pay | Admitting: Family Medicine

## 2021-01-03 ENCOUNTER — Ambulatory Visit: Payer: 59 | Admitting: Family Medicine

## 2021-01-10 ENCOUNTER — Telehealth (INDEPENDENT_AMBULATORY_CARE_PROVIDER_SITE_OTHER): Payer: 59 | Admitting: Psychology

## 2021-01-10 DIAGNOSIS — F3289 Other specified depressive episodes: Secondary | ICD-10-CM

## 2021-01-10 DIAGNOSIS — F419 Anxiety disorder, unspecified: Secondary | ICD-10-CM | POA: Diagnosis not present

## 2021-01-10 NOTE — Progress Notes (Signed)
  Office: [phone]  /  Fax: [phone]    Date: January 10, 2021    Appointment Start Time: 8:00am Duration: 24 minutes Provider: Lawerance Cruel, Psy.D. Type of Session: Individual Therapy  Location of Patient: Home Location of Provider: Provider's Home (private office) Type of Contact: Telepsychological Visit via MyChart Video Visit  Session Content: Catherine [name] is a 40 y.o. female presenting for a follow-up appointment to address the previously established treatment goal of increasing coping skills. Today's appointment was a telepsychological visit due to COVID-19. Catherine [name] provided verbal consent for today's telepsychological appointment and she is aware she is responsible for securing confidentiality on her end of the session. Prior to proceeding with today's appointment, Catherine [name]'s physical location at the time of this appointment was obtained as well a phone number she could be reached at in the event of technical difficulties. Catherine [name] and this provider participated in today's telepsychological service.   This provider conducted a brief check-in. Catherine [name] reported she continues to lose weight, adding she is eating more vegetables and using the structured meal plan for food ideas when journaling. Positive reinforcement was provided. Emotional and physical hunger were reviewed. Psychoeducation regarding triggers for emotional eating was provided. She indicated the increased awareness has help reduce emotional eating. Shakeita was provided a handout, and encouraged to utilize the handout between now and the next appointment to increase awareness of triggers and frequency. Catherine [name] agreed. This provider also discussed behavioral strategies for specific triggers, such as placing the utensil down when conversing to avoid mindless eating. Catherine [name] provided verbal consent during today's appointment for this provider to send a handout about triggers via e-mail. Catherine [name] was receptive to today's appointment as  evidenced by openness to sharing, responsiveness to feedback, and willingness to explore triggers for emotional eating.  Mental Status Examination:  Appearance: well groomed and appropriate hygiene  Behavior: appropriate to circumstances Mood: euthymic Affect: mood congruent Speech: normal in rate, volume, and tone Eye Contact: appropriate Psychomotor Activity: unable to assess  Gait: unable to assess Thought Process: linear, logical, and goal directed  Thought Content/Perception: denies suicidal and homicidal ideation, plan, and intent and no hallucinations, delusions, bizarre thinking or behavior reported or observed Orientation: time, person, place, and purpose of appointment Memory/Concentration: memory, attention, language, and fund of knowledge intact  Insight/Judgment: good  Interventions:  Conducted a brief chart review Provided empathic reflections and validation Reviewed content from the previous session Employed supportive psychotherapy interventions to facilitate reduced distress and to improve coping skills with identified stressors Psychoeducation provided regarding triggers for emotional eating  Provided positive reinforcement   DSM-5 Diagnosis(es): 311 (F32.8) Other Specified Depressive Disorder, Emotional Eating Behaviors and 300.00 (F41.9) Unspecified Anxiety Disorder  Treatment Goal & Progress: During the initial appointment with this provider, the following treatment goal was established: increase coping skills. Catherine [name] has demonstrated progress in her goal as evidenced by increased awareness of hunger patterns and reduction in emotional eating behaviors.   Plan: Catherine [name] reported a plan to call the clinic to schedule her next follow-up appointment as she is starting a new position on April 4th and is unsure what her workflow will entail. In th event that she does not call to schedule her next appointment, Catherine [name] was receptive to this provider calling to check-in.  Additionally, Catherine [name] will be initiating therapeutic services with [facility] on April 7th.

## 2021-01-17 ENCOUNTER — Ambulatory Visit: Payer: 59 | Admitting: Psychology

## 2021-01-20 ENCOUNTER — Other Ambulatory Visit: Payer: Self-pay | Admitting: Student in an Organized Health Care Education/Training Program

## 2021-01-21 ENCOUNTER — Encounter (INDEPENDENT_AMBULATORY_CARE_PROVIDER_SITE_OTHER): Payer: Self-pay | Admitting: Family Medicine

## 2021-01-21 ENCOUNTER — Other Ambulatory Visit: Payer: Self-pay

## 2021-01-21 ENCOUNTER — Ambulatory Visit (INDEPENDENT_AMBULATORY_CARE_PROVIDER_SITE_OTHER): Payer: 59 | Admitting: Family Medicine

## 2021-01-21 VITALS — BP 139/90 | HR 86 | Temp 98.1°F | Ht 67.0 in | Wt >= 6400 oz

## 2021-01-21 DIAGNOSIS — Z6841 Body Mass Index (BMI) 40.0 and over, adult: Secondary | ICD-10-CM | POA: Diagnosis not present

## 2021-01-21 DIAGNOSIS — F3289 Other specified depressive episodes: Secondary | ICD-10-CM

## 2021-01-21 DIAGNOSIS — R632 Polyphagia: Secondary | ICD-10-CM | POA: Diagnosis not present

## 2021-01-21 DIAGNOSIS — Z9189 Other specified personal risk factors, not elsewhere classified: Secondary | ICD-10-CM | POA: Diagnosis not present

## 2021-01-21 MED ORDER — WEGOVY 1.7 MG/0.75ML ~~LOC~~ SOAJ: 1.7000 mg | SUBCUTANEOUS | 0 refills | Status: DC

## 2021-01-21 NOTE — Progress Notes (Signed)
Office: [phone]  /  Fax: [phone]    Date: January 23, 2021   Appointment Start Time: 10:00am Duration: 22 minutes Provider: Lawerance Cruel, Psy.D. Type of Session: Individual Therapy  Location of Patient: Home Location of Provider: Provider's Home (private office) Type of Contact: Telepsychological Visit via MyChart Video Visit  Session Content: Catherine [name] is a 40 y.o. female presenting for a follow-up appointment to address the previously established treatment goal of increasing coping skills. Today's appointment was a telepsychological visit due to COVID-19. Catherine [name] provided verbal consent for today's telepsychological appointment and she is aware she is responsible for securing confidentiality on her end of the session. Prior to proceeding with today's appointment, Catherine [name]'s physical location at the time of this appointment was obtained as well a phone number she could be reached at in the event of technical difficulties. Catherine [name] and this provider participated in today's telepsychological service.   This provider conducted a brief check-in. Catherine [name] reported she is focusing on "maintaining her eating." Triggers for emotional eating were reviewed. She acknowledged experiencing "lack of motivation" due to physical pain. To assist with the aforementioned, Catherine [name] was introduced to mindfulness. Psychoeducation was provided regarding mindfulness, including its impact on overall well being, eating, and physical pain. A handout was provided to Catherine [name] with further information regarding mindfulness, including exercises. This provider also explained the benefit of mindfulness as it relates to emotional eating. Catherine [name] was encouraged to engage in the provided exercises between now and the next appointment with this provider. Catherine [name] agreed. During today's appointment, Catherine [name] was led through a mindfulness exercise involving her senses. Catherine [name] provided verbal consent during today's appointment for this  provider to send a handout about mindfulness via e-mail.  Catherine [name] was receptive to today's appointment as evidenced by openness to sharing, responsiveness to feedback, and willingness to engage in mindfulness exercises to assist with coping.  Mental Status Examination:  Appearance: well groomed and appropriate hygiene  Behavior: appropriate to circumstances Mood: euthymic Affect: mood congruent Speech: normal in rate, volume, and tone Eye Contact: appropriate Psychomotor Activity: unable to assess  Gait: unable to assess Thought Process: linear, logical, and goal directed  Thought Content/Perception: denies suicidal and homicidal ideation, plan, and intent and no hallucinations, delusions, bizarre thinking or behavior reported or observed Orientation: time, person, place, and purpose of appointment Memory/Concentration: memory, attention, language, and fund of knowledge intact  Insight/Judgment: good  Interventions:  Conducted a brief chart review Provided empathic reflections and validation Reviewed content from the previous session Employed supportive psychotherapy interventions to facilitate reduced distress and to improve coping skills with identified stressors Psychoeducation provided regarding mindfulness Engaged patient in mindfulness exercise(s)  DSM-5 Diagnosis(es): 311 (F32.8) Other Specified Depressive Disorder, Emotional Eating Behaviors and 307.50 (F50.9) Unspecified Feeding or Eating Disorder  Treatment Goal & Progress: During the initial appointment with this provider, the following treatment goal was established: increase coping skills. Catherine [name] has demonstrated progress in her goal as evidenced by increased awareness of hunger patterns and increased awareness of triggers for emotional eating. Catherine [name] also demonstrates willingness to engage in mindfulness exercises.  Plan: Given the uncertainty of her work schedule, Catherine [name] indicated she would call the clinic to schedule  her next follow-up appointment with this provider. Additionally, she reported she had to cancel her appointment with [facility] on April 7th due to work, noting she was informed the next available appointment that would work with her schedule is not until June. As such, she provided verbal consent for this provider to place a  referral with [name]. She also provided verbal consent for this provider to send additional referral options via e-mail.

## 2021-01-22 NOTE — Progress Notes (Signed)
Chief Complaint:   OBESITY Catherine [name] is here to discuss her progress with her obesity treatment plan along with follow-up of her obesity related diagnoses. Catherine [name] is on keeping a food journal and adhering to recommended goals of 1700-1800 calories and 110 g protein and states she is following her eating plan approximately 85% of the time. [name] states she is doing cardio and treadmill 45 minutes 1 times per week.  Today's visit was #: 79 Starting weight: 468 lbs Starting date: 10/27/2016 Today's weight: 447 lbs Today's date: 01/21/2021 Total lbs lost to date: 21 lbs Total lbs lost since last in-office visit: 0  Interim History: Catherine [name] is journaling consistently but goes over on calories slightly at times. She is meeting protein goals. She feels [name] is working, but says she could tolerate a higher dose. She will have no insurance coverage in the month of May due to new job.  Subjective:   1. Other Specified Depressive Disorder, Emotional Eating Behavior Catherine [name] is seeing [name] and feels it is beneficial. She has been referred to [facility] but they have no appointments until June. She feels her mood is stable, but is affected by anniversaries of parents death and other holidays.  2. Polyphagia Marlet notes some polyphagia at 1 mg dose of Wegovy.  3. At risk for side effect of medication Catherine [name] is at risk for side effects of medication due to increasing dose of Wegovy.  Assessment/Plan:   1. Other Specified Depressive Disorder, Emotional Eating Behavior Behavior modification techniques were discussed today to help [name] deal with her emotional/non-hunger eating behaviors.  Orders and follow up as documented in patient record. Continue Wellbutrin 150 mg BID.  2. Polyphagia Intensive lifestyle modifications are the first line treatment for this issue. We discussed several lifestyle modifications today and she will continue to work on diet, exercise and weight loss  efforts.  - Semaglutide-Weight Management (WEGOVY) 1.7 MG/0.75ML SOAJ; Inject 1.7 mg into the skin once a week.  Dispense: 3 mL; Refill: 0  3. At risk for side effect of medication Lam was given approximately 15 minutes of drug side effect counseling today.  We discussed side effect possibility and risk versus benefits. [name] agreed to the medication and will contact this office if these side effects are intolerable.  Repetitive spaced learning was employed today to elicit superior memory formation and behavioral change.  4. Obesity: BMI 69 Catherine [name] is currently in the action stage of change. As such, her goal is to continue with weight loss efforts. She has agreed to keeping a food journal and adhering to recommended goals of 1700-1800 calories and 110 protein.   Exercise goals: As is  Behavioral modification strategies: increasing lean protein intake.  Catherine [name] has agreed to follow-up with our clinic in 8 weeks (Pt will not have insurance coverage in the month of May. Requests June appt).   Objective:   Blood pressure 139/90, pulse 86, temperature 98.1 F (36.7 C), height 5\' 7"  (1.702 m), weight (!) 447 lb (202.8 kg), SpO2 97 %. Body mass index is 70.01 kg/m.  General: Cooperative, alert, well developed, in no acute distress. HEENT: Conjunctivae and lids unremarkable. Cardiovascular: Regular rhythm.  Lungs: Normal work of breathing. Neurologic: No focal deficits.   Lab Results  Component Value Date   CREATININE 0.92 09/11/2020   BUN 15 09/11/2020   NA 141 09/11/2020   K 4.9 09/11/2020   CL 104 09/11/2020   CO2 25 09/11/2020   Lab Results  Component Value Date  ALT 20 09/11/2020   AST 21 09/11/2020   ALKPHOS 67 09/11/2020   BILITOT 0.4 09/11/2020   Lab Results  Component Value Date   HGBA1C 5.4 09/11/2020   HGBA1C 5.4 03/06/2020   HGBA1C 5.5 11/22/2019   HGBA1C 5.4 07/26/2019   HGBA1C 5.5 09/28/2018   Lab Results  Component Value Date   INSULIN 43.4 (H)  09/11/2020   INSULIN 39.2 (H) 03/06/2020   INSULIN 32.5 (H) 11/22/2019   INSULIN 41.7 (H) 07/26/2019   INSULIN 29.7 (H) 09/28/2018   Lab Results  Component Value Date   TSH 2.160 07/26/2019   Lab Results  Component Value Date   CHOL 146 07/26/2019   HDL 46 07/26/2019   LDLCALC 90 07/26/2019   TRIG 45 07/26/2019   CHOLHDL 3.6 11/21/2015   Lab Results  Component Value Date   WBC 11.1 (H) 07/26/2019   HGB 12.9 07/26/2019   HCT 38.1 07/26/2019   MCV 88 07/26/2019   PLT 219 07/26/2019   Lab Results  Component Value Date   IRON 25 (L) 05/18/2017   TIBC 363 05/18/2017   FERRITIN 16 05/18/2017    Attestation Statements:   Reviewed by clinician on day of visit: allergies, medications, problem list, medical history, surgical history, family history, social history, and previous encounter notes.  Edmund Hilda, am acting as Energy manager for [place], FNP.  I have reviewed the above documentation for accuracy and completeness, and I agree with the above. -  Jesse Sans, FNP

## 2021-01-23 ENCOUNTER — Telehealth (INDEPENDENT_AMBULATORY_CARE_PROVIDER_SITE_OTHER): Payer: Self-pay

## 2021-01-23 ENCOUNTER — Encounter (INDEPENDENT_AMBULATORY_CARE_PROVIDER_SITE_OTHER): Payer: Self-pay | Admitting: Family Medicine

## 2021-01-23 ENCOUNTER — Telehealth (INDEPENDENT_AMBULATORY_CARE_PROVIDER_SITE_OTHER): Payer: 59 | Admitting: Psychology

## 2021-01-23 DIAGNOSIS — F419 Anxiety disorder, unspecified: Secondary | ICD-10-CM | POA: Diagnosis not present

## 2021-01-23 DIAGNOSIS — F3289 Other specified depressive episodes: Secondary | ICD-10-CM | POA: Diagnosis not present

## 2021-01-23 DIAGNOSIS — R632 Polyphagia: Secondary | ICD-10-CM | POA: Insufficient documentation

## 2021-01-23 NOTE — Telephone Encounter (Signed)
Prior authorization has been started for [facility] Will need to fax medical records

## 2021-01-28 ENCOUNTER — Encounter (INDEPENDENT_AMBULATORY_CARE_PROVIDER_SITE_OTHER): Payer: Self-pay | Admitting: Family Medicine

## 2021-01-28 ENCOUNTER — Other Ambulatory Visit: Payer: Self-pay | Admitting: *Deleted

## 2021-01-28 DIAGNOSIS — R609 Edema, unspecified: Secondary | ICD-10-CM

## 2021-01-28 MED ORDER — FUROSEMIDE 20 MG PO TABS: ORAL_TABLET | ORAL | 2 refills | Status: DC

## 2021-01-28 NOTE — Telephone Encounter (Addendum)
Medical records have been faxed to [phone] New PA has been started:  [id number] Waiting for determination.    PA has been approved for 6 months through 07/30/2021. Pt has been notified to contact her pharmacy for a refill.

## 2021-01-30 ENCOUNTER — Encounter: Payer: Self-pay | Admitting: Family Medicine

## 2021-03-12 ENCOUNTER — Other Ambulatory Visit: Payer: Self-pay | Admitting: Student in an Organized Health Care Education/Training Program

## 2021-03-14 ENCOUNTER — Other Ambulatory Visit: Payer: Self-pay

## 2021-03-14 ENCOUNTER — Encounter (INDEPENDENT_AMBULATORY_CARE_PROVIDER_SITE_OTHER): Payer: Self-pay | Admitting: Family Medicine

## 2021-03-14 ENCOUNTER — Telehealth (INDEPENDENT_AMBULATORY_CARE_PROVIDER_SITE_OTHER): Payer: 59 | Admitting: Family Medicine

## 2021-03-14 DIAGNOSIS — E559 Vitamin D deficiency, unspecified: Secondary | ICD-10-CM | POA: Diagnosis not present

## 2021-03-14 DIAGNOSIS — F3289 Other specified depressive episodes: Secondary | ICD-10-CM | POA: Diagnosis not present

## 2021-03-14 DIAGNOSIS — Z6841 Body Mass Index (BMI) 40.0 and over, adult: Secondary | ICD-10-CM

## 2021-03-14 DIAGNOSIS — R632 Polyphagia: Secondary | ICD-10-CM

## 2021-03-14 MED ORDER — WEGOVY 1.7 MG/0.75ML ~~LOC~~ SOAJ: 1.7000 mg | SUBCUTANEOUS | 0 refills | Status: DC

## 2021-03-14 MED ORDER — BUPROPION HCL ER (SR) 150 MG PO TB12
150.0000 mg | ORAL_TABLET | Freq: Two times a day (BID) | ORAL | 0 refills | Status: DC
Start: 2021-03-14 — End: 2021-06-20

## 2021-03-14 MED ORDER — VITAMIN D (ERGOCALCIFEROL) 1.25 MG (50000 UNIT) PO CAPS: 50000.0000 [IU] | ORAL_CAPSULE | ORAL | 0 refills | Status: DC

## 2021-03-18 ENCOUNTER — Other Ambulatory Visit (INDEPENDENT_AMBULATORY_CARE_PROVIDER_SITE_OTHER): Payer: Self-pay | Admitting: Family Medicine

## 2021-03-18 DIAGNOSIS — R632 Polyphagia: Secondary | ICD-10-CM

## 2021-03-19 NOTE — Telephone Encounter (Signed)
Last seen Dawn 

## 2021-03-20 NOTE — Progress Notes (Signed)
TeleHealth Visit:  Due to the COVID-19 pandemic, this visit was completed with telemedicine (audio/video) technology to reduce patient and provider exposure as well as to preserve personal protective equipment.   Catherine [name] has verbally consented to this TeleHealth visit. The patient is located at home, the provider is located at the [facility] [facility] office. The participants in this visit include the listed provider and patient. The visit was conducted today via MyChart video.  Chief Complaint: OBESITY Catherine [name] is here to discuss her progress with her obesity treatment plan along with follow-up of her obesity related diagnoses. Catherine [name] is on the Category 4 Plan and states she is following her eating plan approximately 85% of the time. Catherine [name] states she is doing cardio and aerobics for 45 minutes 1 time per week.  Today's visit was #: 80 Starting weight: 468 lbs Starting date: 10/27/2016  Interim History: Catherine [name] reports a weight of 442 pounds today.  She was 447 at last office visit.  She is following Category 4 again rather than journaling.  She says that she tends to not eat enough in the summertime.  She is eating all of the protein on the plan.  She is taking an aerobics class at church once weekly.  Subjective:   1. Vitamin D deficiency Nearly at goal (47.6 on 09/11/2021).  On weekly prescription vitamin D.  2. Polyphagia Hunger is well controlled with Wegovy 1.7 mg.  No side effects.  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  3. Other depression, with emotional eating Mood stable overall.  Cravings well controlled.  Assessment/Plan:   1. Vitamin D deficiency Refill vitamin D 50,000 IU weekly, as per below.  - Refill Vitamin D, Ergocalciferol, (DRISDOL) 1.25 MG (50000 UNIT) CAPS capsule; Take 1 capsule (50,000 Units total) by mouth every 7 (seven) days.  Dispense: 12 capsule; Refill: 0  2. Polyphagia Refill Wegovy 1.7 mg subcutaneously weekly.  Needs PA for  new health insurance.  - Refill Semaglutide-Weight Management (WEGOVY) 1.7 MG/0.75ML SOAJ; Inject 1.7 mg into the skin once a week.  Dispense: 3 mL; Refill: 0  3. Other depression, with emotional eating Refill bupropion 150 mg twice daily.  - Refill buPROPion (WELLBUTRIN SR) 150 MG 12 hr tablet; Take 1 tablet (150 mg total) by mouth 2 (two) times daily.  Dispense: 180 tablet; Refill: 0  4. Obesity: BMI 69.99  Catherine [name] is currently in the action stage of change. As such, her goal is to continue with weight loss efforts. She has agreed to the Category 4 Plan.   Exercise goals:  As is.  Behavioral modification strategies: increasing lean protein intake and planning for success.  Catherine [name] has agreed to follow-up with our clinic in 4 weeks.  Objective:   VITALS: Per patient if applicable, see vitals. GENERAL: Alert and in no acute distress. CARDIOPULMONARY: No increased WOB. Speaking in clear sentences.  PSYCH: Pleasant and cooperative. Speech normal rate and rhythm. Affect is appropriate. Insight and judgement are appropriate. Attention is focused, linear, and appropriate.  NEURO: Oriented as arrived to appointment on time with no prompting.   Lab Results  Component Value Date   CREATININE 0.92 09/11/2020   BUN 15 09/11/2020   NA 141 09/11/2020   K 4.9 09/11/2020   CL 104 09/11/2020   CO2 25 09/11/2020   Lab Results  Component Value Date   ALT 20 09/11/2020   AST 21 09/11/2020   ALKPHOS 67 09/11/2020   BILITOT 0.4 09/11/2020   Lab Results  Component Value Date   HGBA1C 5.4 09/11/2020   HGBA1C 5.4 03/06/2020   HGBA1C 5.5 11/22/2019   HGBA1C 5.4 07/26/2019   HGBA1C 5.5 09/28/2018   Lab Results  Component Value Date   INSULIN 43.4 (H) 09/11/2020   INSULIN 39.2 (H) 03/06/2020   INSULIN 32.5 (H) 11/22/2019   INSULIN 41.7 (H) 07/26/2019   INSULIN 29.7 (H) 09/28/2018   Lab Results  Component Value Date   TSH 2.160 07/26/2019   Lab Results  Component Value Date    CHOL 146 07/26/2019   HDL 46 07/26/2019   LDLCALC 90 07/26/2019   TRIG 45 07/26/2019   CHOLHDL 3.6 11/21/2015   Lab Results  Component Value Date   WBC 11.1 (H) 07/26/2019   HGB 12.9 07/26/2019   HCT 38.1 07/26/2019   MCV 88 07/26/2019   PLT 219 07/26/2019   Lab Results  Component Value Date   IRON 25 (L) 05/18/2017   TIBC 363 05/18/2017   FERRITIN 16 05/18/2017   Attestation Statements:   Reviewed by clinician on day of visit: allergies, medications, problem list, medical history, surgical history, family history, social history, and previous encounter notes.  I, Insurance claims handler, CMA, am acting as Energy manager for [place], FNP.  I have reviewed the above documentation for accuracy and completeness, and I agree with the above. - Jesse Sans, FNP

## 2021-03-21 NOTE — Telephone Encounter (Signed)
This medication is not on pt's formulary. Would you like to change or have pt wait until next visit and address with whomever she sees?

## 2021-03-24 ENCOUNTER — Encounter (INDEPENDENT_AMBULATORY_CARE_PROVIDER_SITE_OTHER): Payer: Self-pay | Admitting: Family Medicine

## 2021-03-24 ENCOUNTER — Other Ambulatory Visit: Payer: Self-pay | Admitting: Nurse Practitioner

## 2021-04-09 ENCOUNTER — Telehealth: Payer: Self-pay | Admitting: Family Medicine

## 2021-04-09 NOTE — Telephone Encounter (Signed)
Reviewed form and placed in PCP's box for completion.  .Chrishelle Zito R Rosilyn Coachman, CMA  

## 2021-04-09 NOTE — Telephone Encounter (Signed)
[employer] Placard form dropped off for at front desk for completion.  Verified that patient section of form has been completed.  Last DOS/WCC with PCP was 01/01/21.  Placed form in team folder to be completed by clinical staff.  Catherine [name]

## 2021-04-11 ENCOUNTER — Telehealth (INDEPENDENT_AMBULATORY_CARE_PROVIDER_SITE_OTHER): Payer: Self-pay

## 2021-04-11 NOTE — Telephone Encounter (Signed)
[employer] placard form completed as requested.  Please notify patient form is ready for pick up.   Ronnald Ramp, MD [facility], PGY-2 [phone]

## 2021-04-12 NOTE — Telephone Encounter (Signed)
Form placed up front for pick up. Unable to notify patient.

## 2021-04-16 ENCOUNTER — Ambulatory Visit (INDEPENDENT_AMBULATORY_CARE_PROVIDER_SITE_OTHER): Payer: 59 | Admitting: Family Medicine

## 2021-04-19 ENCOUNTER — Telehealth: Payer: 59 | Admitting: Nurse Practitioner

## 2021-04-19 DIAGNOSIS — J069 Acute upper respiratory infection, unspecified: Secondary | ICD-10-CM

## 2021-04-19 DIAGNOSIS — J029 Acute pharyngitis, unspecified: Secondary | ICD-10-CM | POA: Diagnosis not present

## 2021-04-19 MED ORDER — FLUTICASONE PROPIONATE 50 MCG/ACT NA SUSP: 2.0000 | Freq: Every day | NASAL | 6 refills | Status: DC

## 2021-04-19 MED ORDER — BENZONATATE 100 MG PO CAPS: 100.0000 mg | ORAL_CAPSULE | Freq: Three times a day (TID) | ORAL | 0 refills | Status: DC | PRN

## 2021-04-19 NOTE — Progress Notes (Signed)

## 2021-05-06 ENCOUNTER — Telehealth (INDEPENDENT_AMBULATORY_CARE_PROVIDER_SITE_OTHER): Payer: Self-pay

## 2021-05-06 ENCOUNTER — Encounter (INDEPENDENT_AMBULATORY_CARE_PROVIDER_SITE_OTHER): Payer: Self-pay

## 2021-05-06 ENCOUNTER — Ambulatory Visit (INDEPENDENT_AMBULATORY_CARE_PROVIDER_SITE_OTHER): Payer: 59 | Admitting: Family Medicine

## 2021-05-06 ENCOUNTER — Encounter (INDEPENDENT_AMBULATORY_CARE_PROVIDER_SITE_OTHER): Payer: Self-pay | Admitting: Family Medicine

## 2021-05-06 ENCOUNTER — Other Ambulatory Visit: Payer: Self-pay

## 2021-05-06 VITALS — BP 121/83 | HR 86 | Temp 98.1°F | Ht 67.0 in | Wt >= 6400 oz

## 2021-05-06 DIAGNOSIS — Z9189 Other specified personal risk factors, not elsewhere classified: Secondary | ICD-10-CM | POA: Diagnosis not present

## 2021-05-06 DIAGNOSIS — F3289 Other specified depressive episodes: Secondary | ICD-10-CM | POA: Diagnosis not present

## 2021-05-06 DIAGNOSIS — Z6841 Body Mass Index (BMI) 40.0 and over, adult: Secondary | ICD-10-CM | POA: Diagnosis not present

## 2021-05-06 DIAGNOSIS — E88819 Insulin resistance, unspecified: Secondary | ICD-10-CM

## 2021-05-06 DIAGNOSIS — E8881 Metabolic syndrome: Secondary | ICD-10-CM

## 2021-05-06 DIAGNOSIS — R7301 Impaired fasting glucose: Secondary | ICD-10-CM

## 2021-05-06 MED ORDER — OZEMPIC (0.25 OR 0.5 MG/DOSE) 2 MG/1.5ML ~~LOC~~ SOPN: 0.5000 mg | PEN_INJECTOR | SUBCUTANEOUS | 0 refills | Status: DC

## 2021-05-06 NOTE — Telephone Encounter (Signed)
Need Prior Auth for Ozempic 

## 2021-05-06 NOTE — Telephone Encounter (Signed)
Prior-authorization has been started for Ozempic.

## 2021-05-09 NOTE — Progress Notes (Signed)
Chief Complaint:   OBESITY Catherine [name] is here to discuss her progress with her obesity treatment plan along with follow-up of her obesity related diagnoses. Catherine [name] is on the Category 4 Plan and journaling and states she is following her eating plan approximately 75% of the time. Catherine [name] states she is doing 0 minutes 0 times per week.  Today's visit was #: 80 Starting weight: 468 lbs Starting date: 10/27/2016 Today's weight: 445 lbs Today's date: 05/06/2021 Total lbs lost to date: 23 lbs Total lbs lost since last in-office visit: 2 lbs  Interim History: Catherine [name] has not had an in office visit since 01/21/2021. She had a lapse in insurance coverage. She is down 2 lbs today. She has been off [place] for a few months due to lack of insurance coverage. She was on Phentermine in 2008 and lost it weight with it. She is getting in the prescribed protein. She tends to not get in all the food.  Subjective:   1. Insulin resistance, Impaired fasting glucose Catherine [name] notes polyphagia. Her last insulin level was elevated at 43.4.  Lab Results  Component Value Date   INSULIN 43.4 (H) 09/11/2020   INSULIN 39.2 (H) 03/06/2020   INSULIN 32.5 (H) 11/22/2019   INSULIN 41.7 (H) 07/26/2019   INSULIN 29.7 (H) 09/28/2018   Lab Results  Component Value Date   HGBA1C 5.4 09/11/2020    2. Other depression, with emotional eating Catherine [name] is on bupropion. Not well controlled. She has significant issues with emotional eating.  3. At risk for side effect of medication Catherine [name] is at risk for side effect of medication due to start of Ozempic.  Assessment/Plan:   1. Insulin resistance, Impaired fasting glucose  Catherine [name] agrees to start prescription of Ozempic 0.50 mg weekly with not refills. Catherine [name] agreed to follow-up with [name] as directed to closely monitor her progress.  - Semaglutide,0.25 or 0.5MG /DOS, (OZEMPIC, 0.25 OR 0.5 MG/DOSE,) 2 MG/1.5ML SOPN; Inject 0.5 mg into the skin once a week.  Dispense: 1.5  mL; Refill: 0   2. Other depression, with emotional eating Catherine [name] will continue bupropion.   3.  At risk for side effect of medication Catherine [name] was given approximately 15 minutes of drug side effect counseling today.  We discussed side effect possibility and risk versus benefits. Catherine [name] agreed to the medication and will contact this office if these side effects are intolerable.  Repetitive spaced learning was employed today to elicit superior memory formation and behavioral change.   4. Obesity: BMI 69.68 Catherine [name] is currently in the action stage of change. As such, her goal is to continue with weight loss efforts. She has agreed to the Category 4 Plan.   Discussed bariatric surgery. She lacks insurance coverage. Next appointment with Dr. Manson Passey to discuss possible start of Phentermine.  Exercise goals: No exercise has been prescribed at this time.  Behavioral modification strategies: increasing lean protein intake and decreasing simple carbohydrates.  Catherine [name] has agreed to follow-up with our clinic in 3 weeks with Dr. Manson Passey.   Objective:   Blood pressure 121/83, pulse 86, temperature 98.1 F (36.7 C), height 5\' 7"  (1.702 m), weight (!) 445 lb (201.9 kg), SpO2 96 %. Body mass index is 69.7 kg/m.  General: Cooperative, alert, well developed, in no acute distress. HEENT: Conjunctivae and lids unremarkable. Cardiovascular: Regular rhythm.  Lungs: Normal work of breathing. Neurologic: No focal deficits.   Lab Results  Component Value Date   CREATININE 0.92 09/11/2020   BUN 15 09/11/2020  NA 141 09/11/2020   K 4.9 09/11/2020   CL 104 09/11/2020   CO2 25 09/11/2020   Lab Results  Component Value Date   ALT 20 09/11/2020   AST 21 09/11/2020   ALKPHOS 67 09/11/2020   BILITOT 0.4 09/11/2020   Lab Results  Component Value Date   HGBA1C 5.4 09/11/2020   HGBA1C 5.4 03/06/2020   HGBA1C 5.5 11/22/2019   HGBA1C 5.4 07/26/2019   HGBA1C 5.5 09/28/2018   Lab Results   Component Value Date   INSULIN 43.4 (H) 09/11/2020   INSULIN 39.2 (H) 03/06/2020   INSULIN 32.5 (H) 11/22/2019   INSULIN 41.7 (H) 07/26/2019   INSULIN 29.7 (H) 09/28/2018   Lab Results  Component Value Date   TSH 2.160 07/26/2019   Lab Results  Component Value Date   CHOL 146 07/26/2019   HDL 46 07/26/2019   LDLCALC 90 07/26/2019   TRIG 45 07/26/2019   CHOLHDL 3.6 11/21/2015   Lab Results  Component Value Date   VD25OH 47.6 09/11/2020   VD25OH 42.3 03/06/2020   VD25OH 43.4 11/22/2019   Lab Results  Component Value Date   WBC 11.1 (H) 07/26/2019   HGB 12.9 07/26/2019   HCT 38.1 07/26/2019   MCV 88 07/26/2019   PLT 219 07/26/2019   Lab Results  Component Value Date   IRON 25 (L) 05/18/2017   TIBC 363 05/18/2017   FERRITIN 16 05/18/2017   Attestation Statements:   Reviewed by clinician on day of visit: allergies, medications, problem list, medical history, surgical history, family history, social history, and previous encounter notes.  I, Jackson Latino, RMA, am acting as Energy manager for [place], FNP.   I have reviewed the above documentation for accuracy and completeness, and I agree with the above. -  Jesse Sans, FNP

## 2021-05-27 ENCOUNTER — Other Ambulatory Visit: Payer: Self-pay

## 2021-05-27 ENCOUNTER — Encounter (INDEPENDENT_AMBULATORY_CARE_PROVIDER_SITE_OTHER): Payer: Self-pay | Admitting: Bariatrics

## 2021-05-27 ENCOUNTER — Ambulatory Visit (INDEPENDENT_AMBULATORY_CARE_PROVIDER_SITE_OTHER): Payer: 59 | Admitting: Bariatrics

## 2021-05-27 VITALS — BP 110/70 | HR 77 | Temp 97.9°F | Ht 67.0 in | Wt >= 6400 oz

## 2021-05-27 DIAGNOSIS — R632 Polyphagia: Secondary | ICD-10-CM | POA: Diagnosis not present

## 2021-05-27 DIAGNOSIS — F3289 Other specified depressive episodes: Secondary | ICD-10-CM

## 2021-05-27 DIAGNOSIS — Z6841 Body Mass Index (BMI) 40.0 and over, adult: Secondary | ICD-10-CM

## 2021-05-27 DIAGNOSIS — Z9189 Other specified personal risk factors, not elsewhere classified: Secondary | ICD-10-CM | POA: Diagnosis not present

## 2021-05-27 MED ORDER — PHENTERMINE HCL 30 MG PO CAPS
30.0000 mg | ORAL_CAPSULE | Freq: Every day | ORAL | 0 refills | Status: DC
Start: 2021-05-27 — End: 2021-07-04

## 2021-05-28 NOTE — Progress Notes (Signed)
Chief Complaint:   OBESITY Catherine [name] is here to discuss her progress with her obesity treatment plan along with follow-up of her obesity related diagnoses. [name] is on the Category 4 Plan and states she is following her eating plan approximately 85% of the time. [name] states she is doing cardio for 30 minutes 1-2 times per week.  Today's visit was #: 81 Starting weight: 468 lbs Starting date: 10/27/2016 Today's weight: 438 lbs Today's date:05/27/2021 Total lbs lost to date: 30 lbs Total lbs lost since last in-office visit: 7 lbs  Interim History: Catherine [name] is down 7 lbs. She states that her appetite is down doing the summer. She is focusing on her protein. She had been on Saxenda, but she had insurance issues. She has also been on Ozempic and [id number]. She has used Phentermine in the past. She has no cardiac issues.  Subjective:   1. Polyphagia  [name] is hungry in the evening.  2. Other depression [name] is taking Wellbutrin.  3. At risk for activity intolerance [name] is at risk for activity intolerance due to pain, weather and obesity.  Assessment/Plan:   1. Polyphagia Intensive lifestyle modifications are the first line treatment for this issue. We discussed several lifestyle modifications today and she will continue to work on diet, exercise and weight loss efforts. Orders and follow up as documented in patient record. We will refill Phentermine 30 mg . She will increase protein.  Counseling Polyphagia is excessive hunger. Causes can include: low blood sugars, hypERthyroidism, PMS, lack of sleep, stress, insulin resistance, diabetes, certain medications, and diets that are deficient in protein and fiber.    - phentermine 30 MG capsule; Take 1 capsule (30 mg total) by mouth daily with lunch.  Dispense: 30 capsule; Refill: 0  2. Other depression Behavior modification techniques were discussed today to help Catherine [name] deal with her emotional/non-hunger eating behaviors.   Orders and follow up as documented in patient record. [name] will continue Wellbutrin.  3. At risk for activity intolerance [name] was given approximately 15 minutes of exercise intolerance counseling today. She is 40 y.o. female and has risk factors exercise intolerance including obesity. We discussed intensive lifestyle modifications today with an emphasis on specific weight loss instructions and strategies. [name] will slowly increase activity as tolerated.  Repetitive spaced learning was employed today to elicit superior memory formation and behavioral change.   4. Obesity: BMI 68.6 Catherine [name] is currently in the action stage of change. As such, her goal is to continue with weight loss efforts. She has agreed to the Category 4 Plan.   Catherine [name] will continue to increase protein and water. Catherine [name] will restart Phentermine 30 mg daily with lunch 30 capsule with no refills.  Exercise goals:  Catherine [name] able to walk and be more active.  Behavioral modification strategies: increasing lean protein intake, decreasing simple carbohydrates, increasing vegetables, increasing water intake, decreasing eating out, no skipping meals, meal planning and cooking strategies, keeping healthy foods in the home, and planning for success.  Catherine [name] has agreed to follow-up with our clinic in 2 weeks (fasting). She was informed of the importance of frequent follow-up visits to maximize her success with intensive lifestyle modifications for her multiple health conditions.   Objective:   Blood pressure 110/70, pulse 77, temperature 97.9 F (36.6 C), height 5\' 7"  (1.702 m), weight (!) 438 lb (198.7 kg), SpO2 98 %. Body mass index is 68.6 kg/m.  General: Cooperative, alert, well developed, in no acute distress. HEENT: Conjunctivae and lids unremarkable.  Cardiovascular: Regular rhythm.  Lungs: Normal work of breathing. Neurologic: No focal deficits.   Lab Results  Component Value Date   CREATININE 0.92 09/11/2020    BUN 15 09/11/2020   NA 141 09/11/2020   K 4.9 09/11/2020   CL 104 09/11/2020   CO2 25 09/11/2020   Lab Results  Component Value Date   ALT 20 09/11/2020   AST 21 09/11/2020   ALKPHOS 67 09/11/2020   BILITOT 0.4 09/11/2020   Lab Results  Component Value Date   HGBA1C 5.4 09/11/2020   HGBA1C 5.4 03/06/2020   HGBA1C 5.5 11/22/2019   HGBA1C 5.4 07/26/2019   HGBA1C 5.5 09/28/2018   Lab Results  Component Value Date   INSULIN 43.4 (H) 09/11/2020   INSULIN 39.2 (H) 03/06/2020   INSULIN 32.5 (H) 11/22/2019   INSULIN 41.7 (H) 07/26/2019   INSULIN 29.7 (H) 09/28/2018   Lab Results  Component Value Date   TSH 2.160 07/26/2019   Lab Results  Component Value Date   CHOL 146 07/26/2019   HDL 46 07/26/2019   LDLCALC 90 07/26/2019   TRIG 45 07/26/2019   CHOLHDL 3.6 11/21/2015   Lab Results  Component Value Date   VD25OH 47.6 09/11/2020   VD25OH 42.3 03/06/2020   VD25OH 43.4 11/22/2019   Lab Results  Component Value Date   WBC 11.1 (H) 07/26/2019   HGB 12.9 07/26/2019   HCT 38.1 07/26/2019   MCV 88 07/26/2019   PLT 219 07/26/2019   Lab Results  Component Value Date   IRON 25 (L) 05/18/2017   TIBC 363 05/18/2017   FERRITIN 16 05/18/2017   Attestation Statements:   Reviewed by clinician on day of visit: allergies, medications, problem list, medical history, surgical history, family history, social history, and previous encounter notes.  I, Jackson Latino, RMA, am acting as Energy manager for [employer], DO.   I have reviewed the above documentation for accuracy and completeness, and I agree with the above. Corinna Capra, DO

## 2021-05-29 ENCOUNTER — Encounter (INDEPENDENT_AMBULATORY_CARE_PROVIDER_SITE_OTHER): Payer: Self-pay | Admitting: Bariatrics

## 2021-06-18 ENCOUNTER — Ambulatory Visit (INDEPENDENT_AMBULATORY_CARE_PROVIDER_SITE_OTHER): Payer: 59 | Admitting: Family Medicine

## 2021-06-20 ENCOUNTER — Other Ambulatory Visit (INDEPENDENT_AMBULATORY_CARE_PROVIDER_SITE_OTHER): Payer: Self-pay | Admitting: Family Medicine

## 2021-06-20 ENCOUNTER — Ambulatory Visit (INDEPENDENT_AMBULATORY_CARE_PROVIDER_SITE_OTHER): Payer: 59 | Admitting: Family Medicine

## 2021-06-20 ENCOUNTER — Other Ambulatory Visit: Payer: Self-pay

## 2021-06-20 ENCOUNTER — Encounter (INDEPENDENT_AMBULATORY_CARE_PROVIDER_SITE_OTHER): Payer: Self-pay | Admitting: Family Medicine

## 2021-06-20 VITALS — BP 101/65 | HR 57 | Temp 97.8°F | Ht 67.0 in | Wt >= 6400 oz

## 2021-06-20 DIAGNOSIS — F3289 Other specified depressive episodes: Secondary | ICD-10-CM

## 2021-06-20 DIAGNOSIS — E559 Vitamin D deficiency, unspecified: Secondary | ICD-10-CM

## 2021-06-20 DIAGNOSIS — E8881 Metabolic syndrome: Secondary | ICD-10-CM

## 2021-06-20 DIAGNOSIS — E88819 Insulin resistance, unspecified: Secondary | ICD-10-CM

## 2021-06-20 DIAGNOSIS — Z6841 Body Mass Index (BMI) 40.0 and over, adult: Secondary | ICD-10-CM

## 2021-06-20 DIAGNOSIS — Z9189 Other specified personal risk factors, not elsewhere classified: Secondary | ICD-10-CM

## 2021-06-20 MED ORDER — VITAMIN D (ERGOCALCIFEROL) 1.25 MG (50000 UNIT) PO CAPS: 50000.0000 [IU] | ORAL_CAPSULE | ORAL | 0 refills | Status: DC

## 2021-06-20 MED ORDER — BUPROPION HCL ER (SR) 150 MG PO TB12
150.0000 mg | ORAL_TABLET | Freq: Two times a day (BID) | ORAL | 0 refills | Status: DC
Start: 2021-06-20 — End: 2021-06-20

## 2021-06-20 MED ORDER — BUPROPION HCL ER (SR) 150 MG PO TB12: 150.0000 mg | ORAL_TABLET | Freq: Two times a day (BID) | ORAL | 0 refills | Status: DC

## 2021-06-20 NOTE — Telephone Encounter (Signed)
Dr.Beasley 

## 2021-06-20 NOTE — Progress Notes (Signed)
Chief Complaint:   OBESITY Catherine [name] is here to discuss her progress with her obesity treatment plan along with follow-up of her obesity related diagnoses. Catherine [name] is on the Category 4 Plan and states she is following her eating plan approximately 95% of the time. Catherine [name] states she is doing cardio and circuit training for 45 minutes 6 times per week.  Today's visit was #: 72 Starting weight: 468 lbs Starting date: 10/27/2016 Today's weight: 431 lbs Today's date: 06/20/2021 Total lbs lost to date: 37 Total lbs lost since last in-office visit: 7  Interim History: Catherine [name] has done well with weight loss since her last appointment. Dr. Owens Shark started her on phentermine 30 mg and she appears to be tolerating it well. Her blood pressure is stable and no worsening insomnia.  Subjective:   1. Vitamin D deficiency Catherine [name] is due for labs, and she is at high risk of over-replacement.  2. Insulin resistance Catherine [name] is working on diet and exercise, and she is due for labs. She is not on metformin or GLP-1 currently.   3. Other depression, with emotional eating Catherine [name] is stable on Wellbutrin. She is working on decreasing emotional eating behaviors, and her blood pressure is stable.  4. At risk for heart disease Catherine [name] is at a higher than average risk for cardiovascular disease due to obesity.   Assessment/Plan:   1. Vitamin D deficiency Low Vitamin D level contributes to fatigue and are associated with obesity, breast, and colon cancer. We will check labs today, and we will refill prescription Vitamin D for 1 month. Catherine [name] will follow-up for routine testing of Vitamin D, at least 2-3 times per year to avoid over-replacement.  - VITAMIN D 25 Hydroxy (Vit-D Deficiency, Fractures) - Vitamin D, Ergocalciferol, (DRISDOL) 1.25 MG (50000 UNIT) CAPS capsule; Take 1 capsule (50,000 Units total) by mouth every 7 (seven) days.  Dispense: 4 capsule; Refill: 0  2. Insulin resistance Catherine [name] will  continue to work on weight loss, exercise, and decreasing simple carbohydrates to help decrease the risk of diabetes. We will check labs today. Catherine [name] agreed to follow-up with [name] as directed to closely monitor her progress.  - CMP14+EGFR - Insulin, random - Hemoglobin A1c  3. Other depression, with emotional eating Behavior modification techniques were discussed today to help Catherine [name] deal with her emotional/non-hunger eating behaviors. We will refill Wellbutrin SR for 1 month. Orders and follow up as documented in patient record.   - buPROPion (WELLBUTRIN SR) 150 MG 12 hr tablet; Take 1 tablet (150 mg total) by mouth 2 (two) times daily.  Dispense: 60 tablet; Refill: 0  4. At risk for heart disease Catherine [name] was given approximately 15 minutes of coronary artery disease prevention counseling today. She is 40 y.o. female and has risk factors for heart disease including obesity. We discussed intensive lifestyle modifications today with an emphasis on specific weight loss instructions and strategies.   Repetitive spaced learning was employed today to elicit superior memory formation and behavioral change.  5. Obesity with current BMI 67.5 Catherine [name] is currently in the action stage of change. As such, her goal is to continue with weight loss efforts. She has agreed to the Category 4 Plan.   Exercise goals: As is.  Behavioral modification strategies: meal planning and cooking strategies.  Catherine [name] has agreed to follow-up with our clinic in 2 weeks. She was informed of the importance of frequent follow-up visits to maximize her success with intensive lifestyle modifications for her multiple health conditions.  Catherine [name] was informed we would discuss her lab results at her next visit unless there is a critical issue that needs to be addressed sooner. Catherine [name] agreed to keep her next visit at the agreed upon time to discuss these results.  Objective:   Blood pressure 101/65, pulse (!) 57, temperature  97.8 F (36.6 C), height _0  (1.702 m), weight (!) 431 lb (195.5 kg), SpO2 98 %. Body mass index is 67.5 kg/m.  General: Cooperative, alert, well developed, in no acute distress. HEENT: Conjunctivae and lids unremarkable. Cardiovascular: Regular rhythm.  Lungs: Normal work of breathing. Neurologic: No focal deficits.   Lab Results  Component Value Date   CREATININE 0.92 09/11/2020   BUN 15 09/11/2020   NA 141 09/11/2020   K 4.9 09/11/2020   CL 104 09/11/2020   CO2 25 09/11/2020   Lab Results  Component Value Date   ALT 20 09/11/2020   AST 21 09/11/2020   ALKPHOS 67 09/11/2020   BILITOT 0.4 09/11/2020   Lab Results  Component Value Date   HGBA1C 5.4 09/11/2020   HGBA1C 5.4 03/06/2020   HGBA1C 5.5 11/22/2019   HGBA1C 5.4 07/26/2019   HGBA1C 5.5 09/28/2018   Lab Results  Component Value Date   INSULIN 43.4 (H) 09/11/2020   INSULIN 39.2 (H) 03/06/2020   INSULIN 32.5 (H) 11/22/2019   INSULIN 41.7 (H) 07/26/2019   INSULIN 29.7 (H) 09/28/2018   Lab Results  Component Value Date   TSH 2.160 07/26/2019   Lab Results  Component Value Date   CHOL 146 07/26/2019   HDL 46 07/26/2019   LDLCALC 90 07/26/2019   TRIG 45 07/26/2019   CHOLHDL 3.6 11/21/2015   Lab Results  Component Value Date   VD25OH 47.6 09/11/2020   VD25OH 42.3 03/06/2020   VD25OH 43.4 11/22/2019   Lab Results  Component Value Date   WBC 11.1 (H) 07/26/2019   HGB 12.9 07/26/2019   HCT 38.1 07/26/2019   MCV 88 07/26/2019   PLT 219 07/26/2019   Lab Results  Component Value Date   IRON 25 (L) 05/18/2017   TIBC 363 05/18/2017   FERRITIN 16 05/18/2017   Attestation Statements:   Reviewed by clinician on day of visit: allergies, medications, problem list, medical history, surgical history, family history, social history, and previous encounter notes.   I, Trixie Dredge, am acting as transcriptionist for Dennard Nip, MD.  I have reviewed the above documentation for accuracy and  completeness, and I agree with the above. -  Dennard Nip, MD

## 2021-06-21 LAB — CMP14+EGFR
ALT: 25 IU/L (ref 0–32)
AST: 24 IU/L (ref 0–40)
Albumin/Globulin Ratio: 1.5 (ref 1.2–2.2)
Albumin: 4.3 g/dL (ref 3.8–4.8)
Alkaline Phosphatase: 61 IU/L (ref 44–121)
BUN/Creatinine Ratio: 18 (ref 9–23)
BUN: 16 mg/dL (ref 6–20)
Bilirubin Total: 0.6 mg/dL (ref 0.0–1.2)
CO2: 24 mmol/L (ref 20–29)
Calcium: 9.5 mg/dL (ref 8.7–10.2)
Chloride: 100 mmol/L (ref 96–106)
Creatinine, Ser: 0.91 mg/dL (ref 0.57–1.00)
Globulin, Total: 2.9 g/dL (ref 1.5–4.5)
Glucose: 86 mg/dL (ref 65–99)
Potassium: 4.5 mmol/L (ref 3.5–5.2)
Sodium: 140 mmol/L (ref 134–144)
Total Protein: 7.2 g/dL (ref 6.0–8.5)
eGFR: 82 mL/min/{1.73_m2} (ref >59)

## 2021-06-21 LAB — INSULIN, RANDOM: INSULIN: 29.4 u[IU]/mL — ABNORMAL HIGH (ref 2.6–24.9)

## 2021-06-21 LAB — VITAMIN D 25 HYDROXY (VIT D DEFICIENCY, FRACTURES): Vit D, 25-Hydroxy: 38.6 ng/mL (ref 30.0–100.0)

## 2021-06-21 LAB — HEMOGLOBIN A1C
Est. average glucose Bld gHb Est-mCnc: 105 mg/dL
Hgb A1c MFr Bld: 5.3 % (ref 4.8–5.6)

## 2021-07-01 ENCOUNTER — Other Ambulatory Visit (INDEPENDENT_AMBULATORY_CARE_PROVIDER_SITE_OTHER): Payer: Self-pay | Admitting: Bariatrics

## 2021-07-01 ENCOUNTER — Encounter (INDEPENDENT_AMBULATORY_CARE_PROVIDER_SITE_OTHER): Payer: Self-pay | Admitting: Family Medicine

## 2021-07-01 DIAGNOSIS — R632 Polyphagia: Secondary | ICD-10-CM

## 2021-07-02 NOTE — Telephone Encounter (Signed)
error 

## 2021-07-02 NOTE — Telephone Encounter (Signed)
Pt last seen by Dr. Beasley.  

## 2021-07-04 ENCOUNTER — Encounter (INDEPENDENT_AMBULATORY_CARE_PROVIDER_SITE_OTHER): Payer: Self-pay | Admitting: Family Medicine

## 2021-07-04 ENCOUNTER — Ambulatory Visit (INDEPENDENT_AMBULATORY_CARE_PROVIDER_SITE_OTHER): Payer: 59 | Admitting: Family Medicine

## 2021-07-04 ENCOUNTER — Other Ambulatory Visit: Payer: Self-pay

## 2021-07-04 VITALS — BP 109/73 | HR 95 | Temp 98.0°F | Ht 67.0 in | Wt >= 6400 oz

## 2021-07-04 DIAGNOSIS — K219 Gastro-esophageal reflux disease without esophagitis: Secondary | ICD-10-CM

## 2021-07-04 DIAGNOSIS — Z6841 Body Mass Index (BMI) 40.0 and over, adult: Secondary | ICD-10-CM

## 2021-07-04 DIAGNOSIS — E66813 Obesity, class 3: Secondary | ICD-10-CM

## 2021-07-04 DIAGNOSIS — E559 Vitamin D deficiency, unspecified: Secondary | ICD-10-CM

## 2021-07-04 MED ORDER — PHENTERMINE HCL 30 MG PO CAPS: 30.0000 mg | ORAL_CAPSULE | Freq: Every day | ORAL | 0 refills | Status: DC

## 2021-07-04 NOTE — Progress Notes (Signed)
Chief Complaint:   OBESITY Catherine [name] is here to discuss her progress with her obesity treatment plan along with follow-up of her obesity related diagnoses. [name] is on the Category 4 Plan and states she is following her eating plan approximately 90% of the time. [name] states she is doing full body home workout videos for 30-45 minutes 6 times per week.  Today's visit was #: 83 Starting weight: 468 lbs Starting date: 10/27/2016 Today's weight: 431 lbs Today's date: 07/04/2021 Total lbs lost to date: 37 Total lbs lost since last in-office visit: 0  Interim History: Catherine [name] continues to work on diet and weight loss. She has maintained her weight in the last 2 months. She is exercising most days. She has been on phentermine for 1 month.  Subjective:   1. Vitamin D deficiency Catherine [name] is taking Vit D weekly, and her last level had dropped.  2. Gastroesophageal reflux disease, unspecified whether esophagitis present Catherine [name] is on Protonix, and her heartburn is controlled even when she missed some doses.  Assessment/Plan:   1. Vitamin D deficiency Low Vitamin D level contributes to fatigue and are associated with obesity, breast, and colon cancer. Catherine [name] will continue prescription Vitamin D 50,000 IU every week as is, and we will recheck labs in 3 months. She will follow-up for routine testing of Vitamin D, at least 2-3 times per year to avoid over-replacement.  2. Gastroesophageal reflux disease, unspecified whether esophagitis present Intensive lifestyle modifications are the first line treatment for this issue. We discussed several lifestyle modifications today. Catherine [name] will continue Protonix for now, her goal is to discontinue with enough weight loss. She will continue to work on diet, exercise and weight loss efforts. Orders and follow up as documented in patient record.   3. Obesity with current BMI 67.5 Zarielle is currently in the action stage of change. As such, her goal is  to continue with weight loss efforts. She has agreed to the Category 4 Plan.   We discussed various medication options to help Catherine [name] with her weight loss efforts and we both agreed to continue phentermine and we will refill for 1 month.  - phentermine 30 MG capsule; Take 1 capsule (30 mg total) by mouth daily with lunch.  Dispense: 30 capsule; Refill: 0  Exercise goals: As is.  Behavioral modification strategies: increasing lean protein intake and no skipping meals.  Catherine [name] has agreed to follow-up with our clinic in 2 weeks. She was informed of the importance of frequent follow-up visits to maximize her success with intensive lifestyle modifications for her multiple health conditions.   Objective:   Blood pressure 109/73, pulse 95, temperature 98 F (36.7 C), height 5\' 7"  (1.702 m), weight (!) 431 lb (195.5 kg), SpO2 99 %. Body mass index is 67.5 kg/m.  General: Cooperative, alert, well developed, in no acute distress. HEENT: Conjunctivae and lids unremarkable. Cardiovascular: Regular rhythm.  Lungs: Normal work of breathing. Neurologic: No focal deficits.   Lab Results  Component Value Date   CREATININE 0.91 06/20/2021   BUN 16 06/20/2021   NA 140 06/20/2021   K 4.5 06/20/2021   CL 100 06/20/2021   CO2 24 06/20/2021   Lab Results  Component Value Date   ALT 25 06/20/2021   AST 24 06/20/2021   ALKPHOS 61 06/20/2021   BILITOT 0.6 06/20/2021   Lab Results  Component Value Date   HGBA1C 5.3 06/20/2021   HGBA1C 5.4 09/11/2020   HGBA1C 5.4 03/06/2020   HGBA1C 5.5 11/22/2019  HGBA1C 5.4 07/26/2019   Lab Results  Component Value Date   INSULIN 29.4 (H) 06/20/2021   INSULIN 43.4 (H) 09/11/2020   INSULIN 39.2 (H) 03/06/2020   INSULIN 32.5 (H) 11/22/2019   INSULIN 41.7 (H) 07/26/2019   Lab Results  Component Value Date   TSH 2.160 07/26/2019   Lab Results  Component Value Date   CHOL 146 07/26/2019   HDL 46 07/26/2019   LDLCALC 90 07/26/2019   TRIG 45  07/26/2019   CHOLHDL 3.6 11/21/2015   Lab Results  Component Value Date   VD25OH 38.6 06/20/2021   VD25OH 47.6 09/11/2020   VD25OH 42.3 03/06/2020   Lab Results  Component Value Date   WBC 11.1 (H) 07/26/2019   HGB 12.9 07/26/2019   HCT 38.1 07/26/2019   MCV 88 07/26/2019   PLT 219 07/26/2019   Lab Results  Component Value Date   IRON 25 (L) 05/18/2017   TIBC 363 05/18/2017   FERRITIN 16 05/18/2017   Attestation Statements:   Reviewed by clinician on day of visit: allergies, medications, problem list, medical history, surgical history, family history, social history, and previous encounter notes.  Time spent on visit including pre-visit chart review and post-visit care and charting was 40 minutes.    I, Burt Knack, am acting as transcriptionist for Quillian Quince, MD.  I have reviewed the above documentation for accuracy and completeness, and I agree with the above. -  Quillian Quince, MD

## 2021-07-08 ENCOUNTER — Ambulatory Visit (INDEPENDENT_AMBULATORY_CARE_PROVIDER_SITE_OTHER): Payer: 59 | Admitting: Family Medicine

## 2021-07-09 NOTE — Telephone Encounter (Signed)
Pt last seen by Dr. Beasley.  

## 2021-07-10 ENCOUNTER — Telehealth (INDEPENDENT_AMBULATORY_CARE_PROVIDER_SITE_OTHER): Payer: Self-pay

## 2021-07-10 NOTE — Telephone Encounter (Signed)
Patient is requesting that her prescription refill for [facility] be [facility] [address].  [facility] [address] [facility] [address] [facility].  She would like this done asap.  Thank you

## 2021-07-11 ENCOUNTER — Other Ambulatory Visit (INDEPENDENT_AMBULATORY_CARE_PROVIDER_SITE_OTHER): Payer: Self-pay

## 2021-07-11 ENCOUNTER — Encounter (INDEPENDENT_AMBULATORY_CARE_PROVIDER_SITE_OTHER): Payer: Self-pay | Admitting: Family Medicine

## 2021-07-11 DIAGNOSIS — Z6841 Body Mass Index (BMI) 40.0 and over, adult: Secondary | ICD-10-CM

## 2021-07-11 MED ORDER — PHENTERMINE HCL 30 MG PO CAPS: 30.0000 mg | ORAL_CAPSULE | Freq: Every day | ORAL | 0 refills | Status: DC

## 2021-07-11 NOTE — Telephone Encounter (Signed)
[facility] and [facility]. That [facility] is having issues getting [facility].

## 2021-07-11 NOTE — Telephone Encounter (Signed)
Please cancel the last rx (make sure it wasn't picked up already) and send to the pharmacy she requested.

## 2021-07-11 NOTE — Telephone Encounter (Signed)
Pt last seen by Dr. Beasley.  

## 2021-07-18 ENCOUNTER — Encounter (INDEPENDENT_AMBULATORY_CARE_PROVIDER_SITE_OTHER): Payer: Self-pay | Admitting: Family Medicine

## 2021-07-18 ENCOUNTER — Other Ambulatory Visit: Payer: Self-pay

## 2021-07-18 ENCOUNTER — Ambulatory Visit (INDEPENDENT_AMBULATORY_CARE_PROVIDER_SITE_OTHER): Payer: 59 | Admitting: Family Medicine

## 2021-07-18 DIAGNOSIS — Z6841 Body Mass Index (BMI) 40.0 and over, adult: Secondary | ICD-10-CM | POA: Diagnosis not present

## 2021-07-18 DIAGNOSIS — F3289 Other specified depressive episodes: Secondary | ICD-10-CM | POA: Diagnosis not present

## 2021-07-18 DIAGNOSIS — E559 Vitamin D deficiency, unspecified: Secondary | ICD-10-CM | POA: Diagnosis not present

## 2021-07-22 NOTE — Progress Notes (Signed)
Chief Complaint:   OBESITY Catherine [name] is here to discuss her progress with her obesity treatment plan along with follow-up of her obesity related diagnoses. Catherine [name] is on the Category 4 Plan and states she is following her eating plan approximately 90% of the time. Catherine [name] states she is doing cardio and full body for 45 minutes 6 times per week.  Today's visit was #: 84 Starting weight: 468 lbs Starting date: 10/27/2016 Today's weight: 425 lbs Today's date: 07/18/2021 Total lbs lost to date: 43 Total lbs lost since last in-office visit: 6  Interim History: [name] continues to do well with weight loss. Her hunger is controlled for the most part. She is tolerating phentermine well, and no tremors or palpitations were noted.  Subjective:   1. Vitamin D deficiency Catherine [name] is stable on Vit D, and she denies nausea, vomiting, or muscle weakness.  2. Other depression, with emotional eating Catherine [name] is doing well on her medications, and she denies signs of tremors. Her blood pressure remains stable.  Assessment/Plan:   1. Vitamin D deficiency Low Vitamin D level contributes to fatigue and are associated with obesity, breast, and colon cancer. Catherine [name] will continue prescription Vitamin D 50,000 IU every week, and we will recheck labs in 1 month. She will follow-up for routine testing of Vitamin D, at least 2-3 times per year to avoid over-replacement.  2. Other depression, with emotional eating Behavior modification techniques were discussed today to help Catherine [name] deal with her emotional/non-hunger eating behaviors. [name] will continue Wellbutrin SR as is. Orders and follow up as documented in patient record.   3. Obesity with current BMI 66.7 Catherine [name] is currently in the action stage of change. As such, her goal is to continue with weight loss efforts. She has agreed to the Category 4 Plan.   We discussed various medication options to help Catherine [name] with her weight loss efforts and we both  agreed to continue phentermine at 30 mg, and we will refill at her next visit.  Exercise goals: As is.  Behavioral modification strategies: increasing lean protein intake and meal planning and cooking strategies.  Catherine [name] has agreed to follow-up with our clinic in 3 weeks. She was informed of the importance of frequent follow-up visits to maximize her success with intensive lifestyle modifications for her multiple health conditions.   Objective:   Blood pressure 96/65, pulse 72, temperature 97.9 F (36.6 C), height 5\' 7"  (1.702 m), weight (!) 425 lb (192.8 kg), SpO2 100 %. Body mass index is 66.56 kg/m.  General: Cooperative, alert, well developed, in no acute distress. HEENT: Conjunctivae and lids unremarkable. Cardiovascular: Regular rhythm.  Lungs: Normal work of breathing. Neurologic: No focal deficits.   Lab Results  Component Value Date   CREATININE 0.91 06/20/2021   BUN 16 06/20/2021   NA 140 06/20/2021   K 4.5 06/20/2021   CL 100 06/20/2021   CO2 24 06/20/2021   Lab Results  Component Value Date   ALT 25 06/20/2021   AST 24 06/20/2021   ALKPHOS 61 06/20/2021   BILITOT 0.6 06/20/2021   Lab Results  Component Value Date   HGBA1C 5.3 06/20/2021   HGBA1C 5.4 09/11/2020   HGBA1C 5.4 03/06/2020   HGBA1C 5.5 11/22/2019   HGBA1C 5.4 07/26/2019   Lab Results  Component Value Date   INSULIN 29.4 (H) 06/20/2021   INSULIN 43.4 (H) 09/11/2020   INSULIN 39.2 (H) 03/06/2020   INSULIN 32.5 (H) 11/22/2019   INSULIN 41.7 (H) 07/26/2019  Lab Results  Component Value Date   TSH 2.160 07/26/2019   Lab Results  Component Value Date   CHOL 146 07/26/2019   HDL 46 07/26/2019   LDLCALC 90 07/26/2019   TRIG 45 07/26/2019   CHOLHDL 3.6 11/21/2015   Lab Results  Component Value Date   VD25OH 38.6 06/20/2021   VD25OH 47.6 09/11/2020   VD25OH 42.3 03/06/2020   Lab Results  Component Value Date   WBC 11.1 (H) 07/26/2019   HGB 12.9 07/26/2019   HCT 38.1 07/26/2019    MCV 88 07/26/2019   PLT 219 07/26/2019   Lab Results  Component Value Date   IRON 25 (L) 05/18/2017   TIBC 363 05/18/2017   FERRITIN 16 05/18/2017   Attestation Statements:   Reviewed by clinician on day of visit: allergies, medications, problem list, medical history, surgical history, family history, social history, and previous encounter notes.  Time spent on visit including pre-visit chart review and post-visit care and charting was 30 minutes.    I, Burt Knack, am acting as transcriptionist for Quillian Quince, MD.  I have reviewed the above documentation for accuracy and completeness, and I agree with the above. -  Quillian Quince, MD

## 2021-07-25 NOTE — Patient Instructions (Addendum)
It was nice seeing you today!  Medications refilled today. Call your pharmacy if you need refills in the future.  Please arrive at least 15 minutes prior to your scheduled appointments.  Stay well, Littie Deeds, MD [facility] [phone]

## 2021-07-25 NOTE — Progress Notes (Signed)
    SUBJECTIVE:   CHIEF COMPLAINT / HPI: Follow-up pain management  Chronic pain Previously was seeing Dr. Minerva Fester, pain specialist, but now he is out of network. Has been on celecoxib 200 mg daily prn and duloxetine 30 mg daily. She has been taking the celecoxib about 4 days/week. Reports doing well with these medications and is wondering if we are able to refill and prescribe these. Had an injury years ago when she tore her ACL after falling off a [employer]. Reports chronic pain all over.  PERTINENT  PMH / PSH: Morbid obesity, bipolar disorder  OBJECTIVE:   BP (!) 133/97   Pulse (!) 101   Ht 5\' 7"  (1.702 m)   Wt (!) 429 lb 6.4 oz (194.8 kg)   SpO2 97%   BMI 67.25 kg/m   General: obese young female, NAD CV: RRR, no murmurs Pulm: CTAB, no wheezes or rales  ASSESSMENT/PLAN:   Chronic pain syndrome Celecoxib and duloxetine which were previously prescribed by pain specialist refilled.  Since these are lower risk medications, we can continue to prescribe these for her.  She was also receiving a compounded topical medication which we will not be able to provide.  Release of records obtained.     , MD [facility]

## 2021-07-26 ENCOUNTER — Encounter: Payer: Self-pay | Admitting: Family Medicine

## 2021-07-26 ENCOUNTER — Other Ambulatory Visit: Payer: Self-pay

## 2021-07-26 ENCOUNTER — Ambulatory Visit: Payer: 59 | Admitting: Family Medicine

## 2021-07-26 VITALS — BP 133/97 | HR 101 | Ht 67.0 in | Wt >= 6400 oz

## 2021-07-26 DIAGNOSIS — G894 Chronic pain syndrome: Secondary | ICD-10-CM | POA: Diagnosis not present

## 2021-07-26 DIAGNOSIS — Z23 Encounter for immunization: Secondary | ICD-10-CM | POA: Diagnosis not present

## 2021-07-26 MED ORDER — DULOXETINE HCL 30 MG PO CPEP: 30.0000 mg | ORAL_CAPSULE | Freq: Every day | ORAL | 2 refills | Status: DC

## 2021-07-26 MED ORDER — CELECOXIB 200 MG PO CAPS: 200.0000 mg | ORAL_CAPSULE | Freq: Every day | ORAL | 0 refills | Status: DC | PRN

## 2021-07-26 NOTE — Assessment & Plan Note (Addendum)
Celecoxib and duloxetine which were previously prescribed by pain specialist refilled.  Since these are lower risk medications, we can continue to prescribe these for her.  She was also receiving a compounded topical medication which we will not be able to provide.  Release of records obtained.

## 2021-08-01 ENCOUNTER — Ambulatory Visit (INDEPENDENT_AMBULATORY_CARE_PROVIDER_SITE_OTHER): Payer: 59 | Admitting: Family Medicine

## 2021-08-05 ENCOUNTER — Other Ambulatory Visit: Payer: Self-pay

## 2021-08-05 ENCOUNTER — Encounter (INDEPENDENT_AMBULATORY_CARE_PROVIDER_SITE_OTHER): Payer: Self-pay | Admitting: Family Medicine

## 2021-08-05 ENCOUNTER — Ambulatory Visit (INDEPENDENT_AMBULATORY_CARE_PROVIDER_SITE_OTHER): Payer: 59 | Admitting: Family Medicine

## 2021-08-05 VITALS — BP 117/75 | HR 96 | Temp 98.4°F | Ht 67.0 in | Wt >= 6400 oz

## 2021-08-05 DIAGNOSIS — E559 Vitamin D deficiency, unspecified: Secondary | ICD-10-CM | POA: Diagnosis not present

## 2021-08-05 DIAGNOSIS — Z6841 Body Mass Index (BMI) 40.0 and over, adult: Secondary | ICD-10-CM

## 2021-08-05 DIAGNOSIS — R632 Polyphagia: Secondary | ICD-10-CM | POA: Diagnosis not present

## 2021-08-05 DIAGNOSIS — Z9189 Other specified personal risk factors, not elsewhere classified: Secondary | ICD-10-CM | POA: Diagnosis not present

## 2021-08-05 MED ORDER — PHENTERMINE HCL 30 MG PO CAPS: 30.0000 mg | ORAL_CAPSULE | Freq: Every day | ORAL | 0 refills | Status: DC

## 2021-08-05 MED ORDER — TOPIRAMATE 25 MG PO TABS: 25.0000 mg | ORAL_TABLET | Freq: Every day | ORAL | 0 refills | Status: DC

## 2021-08-05 NOTE — Progress Notes (Signed)
Chief Complaint:   OBESITY Catherine [name] is here to discuss her progress with her obesity treatment plan along with follow-up of her obesity related diagnoses. Catherine [name] is on the Category 4 Plan and states she is following her eating plan approximately 85% of the time. Catherine [name] states she is doing cardio 45 minutes 3 times per week.  Today's visit was #: 85 Starting weight: 468 lbs Starting date: 10/27/2016 Today's weight: 429 lbs Today's date: 08/05/2021 Total lbs lost to date: 39 Total lbs lost since last in-office visit: 0  Interim History: Kenli is experiencing some sense of hunger in the evening. She was able to control it and ignore it initially. Pt is doing mostly lean burger or [place] burger for dinner. She is getting 10 oz at dinner and 2.5 cups of vegetables. She is doing sandwiches for lunch. Pt is doing yogurt for snack calories. PDMP checked.  Subjective:   1. Polyphagia Catherine [name] reports increased drive to eat in the evenings. She notes difficulty in controlling.  2. Vitamin D deficiency Catherine [name] is on prescription Vit D weekly.  3. At risk for side effect of medication Catherine [name] is at risk for side effects of medication due to starting topiramate.  Assessment/Plan:   1. Polyphagia Intensive lifestyle modifications are the first line treatment for this issue. We discussed several lifestyle modifications today and she will continue to work on diet, exercise and weight loss efforts. Orders and follow up as documented in patient record. Pt will add topiramate 25 mg PO qhs.  Counseling Polyphagia is excessive hunger. Causes can include: low blood sugars, hypERthyroidism, PMS, lack of sleep, stress, insulin resistance, diabetes, certain medications, and diets that are deficient in protein and fiber.   Start- topiramate (TOPAMAX) 25 MG tablet; Take 1 tablet (25 mg total) by mouth daily.  Dispense: 30 tablet; Refill: 0  2. Vitamin D deficiency Low Vitamin D level contributes  to fatigue and are associated with obesity, breast, and colon cancer. She agrees to continue to take prescription Vitamin D 50,000 IU every week and will follow-up for routine testing of Vitamin D, at least 2-3 times per year to avoid over-replacement.  3. At risk for side effect of medication Catherine [name] was given approximately 15 minutes of drug side effect counseling today.  We discussed side effect possibility and risk versus benefits. Catherine [name] agreed to the medication and will contact this office if these side effects are intolerable.  Repetitive spaced learning was employed today to elicit superior memory formation and behavioral change.  4. Obesity with current BMI of 67.2  Catherine [name] is currently in the action stage of change. As such, her goal is to continue with weight loss efforts. She has agreed to the Category 4 Plan and keeping a food journal and adhering to recommended goals of 450-600 calories and 40+ grams protein with lunch.   We discussed various medication options to help Catherine [name] with her weight loss efforts and we both agreed to continue phentermine. - phentermine 30 MG capsule; Take 1 capsule (30 mg total) by mouth daily with lunch.  Dispense: 30 capsule; Refill: 0  Exercise goals: All adults should avoid inactivity. Some physical activity is better than none, and adults who participate in any amount of physical activity gain some health benefits.  Behavioral modification strategies: increasing lean protein intake, meal planning and cooking strategies, keeping healthy foods in the home, and planning for success.  Catherine [name] has agreed to follow-up with our clinic in 2-3 weeks. She was informed of  the importance of frequent follow-up visits to maximize her success with intensive lifestyle modifications for her multiple health conditions.    Objective:   Blood pressure 117/75, pulse 96, temperature 98.4 F (36.9 C), height 5\' 7"  (1.702 m), weight (!) 429 lb (194.6 kg), SpO2 98  %. Body mass index is 67.19 kg/m.  General: Cooperative, alert, well developed, in no acute distress. HEENT: Conjunctivae and lids unremarkable. Cardiovascular: Regular rhythm.  Lungs: Normal work of breathing. Neurologic: No focal deficits.   Lab Results  Component Value Date   CREATININE 0.91 06/20/2021   BUN 16 06/20/2021   NA 140 06/20/2021   K 4.5 06/20/2021   CL 100 06/20/2021   CO2 24 06/20/2021   Lab Results  Component Value Date   ALT 25 06/20/2021   AST 24 06/20/2021   ALKPHOS 61 06/20/2021   BILITOT 0.6 06/20/2021   Lab Results  Component Value Date   HGBA1C 5.3 06/20/2021   HGBA1C 5.4 09/11/2020   HGBA1C 5.4 03/06/2020   HGBA1C 5.5 11/22/2019   HGBA1C 5.4 07/26/2019   Lab Results  Component Value Date   INSULIN 29.4 (H) 06/20/2021   INSULIN 43.4 (H) 09/11/2020   INSULIN 39.2 (H) 03/06/2020   INSULIN 32.5 (H) 11/22/2019   INSULIN 41.7 (H) 07/26/2019   Lab Results  Component Value Date   TSH 2.160 07/26/2019   Lab Results  Component Value Date   CHOL 146 07/26/2019   HDL 46 07/26/2019   LDLCALC 90 07/26/2019   TRIG 45 07/26/2019   CHOLHDL 3.6 11/21/2015   Lab Results  Component Value Date   VD25OH 38.6 06/20/2021   VD25OH 47.6 09/11/2020   VD25OH 42.3 03/06/2020   Lab Results  Component Value Date   WBC 11.1 (H) 07/26/2019   HGB 12.9 07/26/2019   HCT 38.1 07/26/2019   MCV 88 07/26/2019   PLT 219 07/26/2019   Lab Results  Component Value Date   IRON 25 (L) 05/18/2017   TIBC 363 05/18/2017   FERRITIN 16 05/18/2017    Attestation Statements:   Reviewed by clinician on day of visit: allergies, medications, problem list, medical history, surgical history, family history, social history, and previous encounter notes.  07/18/2017, CMA, am acting as transcriptionist for Edmund Hilda, MD.   I have reviewed the above documentation for accuracy and completeness, and I agree with the above. - Reuben Likes, MD

## 2021-08-14 ENCOUNTER — Encounter (INDEPENDENT_AMBULATORY_CARE_PROVIDER_SITE_OTHER): Payer: Self-pay | Admitting: Family Medicine

## 2021-08-14 MED ORDER — PHENTERMINE HCL 37.5 MG PO CAPS
37.5000 mg | ORAL_CAPSULE | ORAL | 0 refills | Status: DC
Start: 2021-08-14 — End: 2021-09-24

## 2021-08-14 NOTE — Addendum Note (Signed)
Addended by: Langston Reusing on: 08/14/2021 03:46 PM   Modules accepted: Orders

## 2021-08-14 NOTE — Telephone Encounter (Signed)
Pt last seen by Dr. Ukleja.  

## 2021-08-15 ENCOUNTER — Ambulatory Visit (INDEPENDENT_AMBULATORY_CARE_PROVIDER_SITE_OTHER): Payer: 59 | Admitting: Family Medicine

## 2021-08-15 ENCOUNTER — Telehealth: Payer: Self-pay | Admitting: Family Medicine

## 2021-08-15 NOTE — Telephone Encounter (Signed)
Patient filled out a ROI at her last appointment. This was to release records from her pain management doctor. This was not a an actual office but an online clinic? I was not able to find any information for the fax number or medical records dept.   I have scanned release into chart with a note attached explaining.

## 2021-08-19 ENCOUNTER — Other Ambulatory Visit: Payer: Self-pay | Admitting: Family Medicine

## 2021-08-24 ENCOUNTER — Other Ambulatory Visit: Payer: Self-pay | Admitting: Family Medicine

## 2021-08-27 ENCOUNTER — Other Ambulatory Visit (INDEPENDENT_AMBULATORY_CARE_PROVIDER_SITE_OTHER): Payer: Self-pay | Admitting: Family Medicine

## 2021-08-27 DIAGNOSIS — R632 Polyphagia: Secondary | ICD-10-CM

## 2021-08-29 ENCOUNTER — Ambulatory Visit (INDEPENDENT_AMBULATORY_CARE_PROVIDER_SITE_OTHER): Payer: 59 | Admitting: Bariatrics

## 2021-08-29 ENCOUNTER — Other Ambulatory Visit: Payer: Self-pay

## 2021-08-29 ENCOUNTER — Encounter (INDEPENDENT_AMBULATORY_CARE_PROVIDER_SITE_OTHER): Payer: Self-pay | Admitting: Bariatrics

## 2021-08-29 VITALS — BP 120/80 | HR 97 | Temp 98.3°F | Ht 67.0 in | Wt >= 6400 oz

## 2021-08-29 DIAGNOSIS — Z6841 Body Mass Index (BMI) 40.0 and over, adult: Secondary | ICD-10-CM

## 2021-08-29 DIAGNOSIS — K219 Gastro-esophageal reflux disease without esophagitis: Secondary | ICD-10-CM | POA: Diagnosis not present

## 2021-08-29 DIAGNOSIS — R632 Polyphagia: Secondary | ICD-10-CM | POA: Diagnosis not present

## 2021-08-29 NOTE — Progress Notes (Signed)
Chief Complaint:   OBESITY Catherine [name] is here to discuss her progress with her obesity treatment plan along with follow-up of her obesity related diagnoses. Catherine [name] is on the Category 4 Plan and states she is following her eating plan approximately 80% of the time. Catherine [name] states she is doing cardio for 30 minutes 2 times per week.  Today's visit was #: 86 Starting weight: 468 lbs Starting date: 10/27/2016 Today's weight: 426 lbs Today's date: 08/29/2021 Total lbs lost to date: 42 lbs Total lbs lost since last in-office visit: 3 lbs  Interim History: Catherine [name] is 3 lbs down. She is doing well with her protein and water intake.  Subjective:   1. Gastroesophageal reflux disease, unspecified whether esophagitis present Catherine [name] is taking Protonix currently. Her GERD is well controlled.  2. Catherine [name] is currently taking Phentermine.  Assessment/Plan:   1. Gastroesophageal reflux disease, unspecified whether esophagitis present Intensive lifestyle modifications are the first line treatment for this issue. We discussed several lifestyle modifications today and she will continue to work on diet, exercise and weight loss efforts. Catherine [name] will continue her medications. She will avoid her triggers.Orders and follow up as documented in patient record.   Counseling If a person has gastroesophageal reflux disease (GERD), food and stomach acid move back up into the esophagus and cause symptoms or problems such as damage to the esophagus. Anti-reflux measures include: raising the head of the bed, avoiding tight clothing or belts, avoiding eating late at night, not lying down shortly after mealtime, and achieving weight loss. Avoid ASA, NSAID's, caffeine, alcohol, and tobacco.  OTC Pepcid and/or Tums are often very helpful for as needed use.  However, for persisting chronic or daily symptoms, stronger medications like Omeprazole may be needed. You may need to avoid foods and drinks such  as: Coffee and tea (with or without caffeine). Drinks that contain alcohol. Energy drinks and sports drinks. Bubbly (carbonated) drinks or sodas. Chocolate and cocoa. Peppermint and mint flavorings. Garlic and onions. Horseradish. Spicy and acidic foods. These include peppers, chili powder, curry powder, vinegar, hot sauces, and BBQ sauce. Citrus fruit juices and citrus fruits, such as oranges, lemons, and limes. Tomato-based foods. These include red sauce, chili, salsa, and pizza with red sauce. Fried and fatty foods. These include donuts, french fries, potato chips, and high-fat dressings. High-fat meats. These include hot dogs, rib eye steak, sausage, ham, and bacon.   2. Polyphagia Intensive lifestyle modifications are the first line treatment for this issue. We discussed several lifestyle modifications today and she will continue to work on diet, exercise and weight loss efforts. Catherine [name] will continue the Phentermine. Orders and follow up as documented in patient record.  Counseling Polyphagia is excessive hunger. Causes can include: low blood sugars, hypERthyroidism, PMS, lack of sleep, stress, insulin resistance, diabetes, certain medications, and diets that are deficient in protein and fiber.    3. Obesity with current BMI of 66.8 Catherine [name] is currently in the action stage of change. As such, her goal is to continue with weight loss efforts. She has agreed to the Category 4 Plan.   Catherine [name] will continue meal planning. She will work on making better choices.  Exercise goals:  As is.  Behavioral modification strategies: increasing lean protein intake, decreasing simple carbohydrates, increasing vegetables, increasing water intake, decreasing eating out, no skipping meals, meal planning and cooking strategies, keeping healthy foods in the home, and planning for success.  [name] has agreed to follow-up with our clinic in 3 weeks  with Dr. Lawson Radar or Adah Salvage, FNP. She was  informed of the importance of frequent follow-up visits to maximize her success with intensive lifestyle modifications for her multiple health conditions.   Objective:   Blood pressure 120/80, pulse 97, temperature 98.3 F (36.8 C), height 5\' 7"  (1.702 m), weight (!) 426 lb (193.2 kg), SpO2 99 %. Body mass index is 66.72 kg/m.  General: Cooperative, alert, well developed, in no acute distress. HEENT: Conjunctivae and lids unremarkable. Cardiovascular: Regular rhythm.  Lungs: Normal work of breathing. Neurologic: No focal deficits.   Lab Results  Component Value Date   CREATININE 0.91 06/20/2021   BUN 16 06/20/2021   NA 140 06/20/2021   K 4.5 06/20/2021   CL 100 06/20/2021   CO2 24 06/20/2021   Lab Results  Component Value Date   ALT 25 06/20/2021   AST 24 06/20/2021   ALKPHOS 61 06/20/2021   BILITOT 0.6 06/20/2021   Lab Results  Component Value Date   HGBA1C 5.3 06/20/2021   HGBA1C 5.4 09/11/2020   HGBA1C 5.4 03/06/2020   HGBA1C 5.5 11/22/2019   HGBA1C 5.4 07/26/2019   Lab Results  Component Value Date   INSULIN 29.4 (H) 06/20/2021   INSULIN 43.4 (H) 09/11/2020   INSULIN 39.2 (H) 03/06/2020   INSULIN 32.5 (H) 11/22/2019   INSULIN 41.7 (H) 07/26/2019   Lab Results  Component Value Date   TSH 2.160 07/26/2019   Lab Results  Component Value Date   CHOL 146 07/26/2019   HDL 46 07/26/2019   LDLCALC 90 07/26/2019   TRIG 45 07/26/2019   CHOLHDL 3.6 11/21/2015   Lab Results  Component Value Date   VD25OH 38.6 06/20/2021   VD25OH 47.6 09/11/2020   VD25OH 42.3 03/06/2020   Lab Results  Component Value Date   WBC 11.1 (H) 07/26/2019   HGB 12.9 07/26/2019   HCT 38.1 07/26/2019   MCV 88 07/26/2019   PLT 219 07/26/2019   Lab Results  Component Value Date   IRON 25 (L) 05/18/2017   TIBC 363 05/18/2017   FERRITIN 16 05/18/2017   Attestation Statements:   Reviewed by clinician on day of visit: allergies, medications, problem list, medical history,  surgical history, family history, social history, and previous encounter notes.  I, 07/18/2017, RMA, am acting as [place] [employer] for Energy manager, DO.   I have reviewed the above documentation for accuracy and completeness, and I agree with the above. [employer], DO

## 2021-08-30 ENCOUNTER — Encounter (INDEPENDENT_AMBULATORY_CARE_PROVIDER_SITE_OTHER): Payer: Self-pay | Admitting: Family Medicine

## 2021-08-30 ENCOUNTER — Other Ambulatory Visit (INDEPENDENT_AMBULATORY_CARE_PROVIDER_SITE_OTHER): Payer: Self-pay | Admitting: Family Medicine

## 2021-08-30 DIAGNOSIS — R632 Polyphagia: Secondary | ICD-10-CM

## 2021-09-02 ENCOUNTER — Other Ambulatory Visit (INDEPENDENT_AMBULATORY_CARE_PROVIDER_SITE_OTHER): Payer: Self-pay | Admitting: Bariatrics

## 2021-09-02 DIAGNOSIS — R632 Polyphagia: Secondary | ICD-10-CM

## 2021-09-02 MED ORDER — TOPIRAMATE 25 MG PO TABS
25.0000 mg | ORAL_TABLET | Freq: Every day | ORAL | 0 refills | Status: DC
Start: 2021-09-02 — End: 2021-09-24

## 2021-09-02 NOTE — Telephone Encounter (Signed)
LAST APPOINTMENT DATE: 08/29/21 NEXT APPOINTMENT DATE: 09/24/21   [facility] - [place], [place] - [address] [place] [address] Phone: [phone] Fax: [phone]  [facility] [place], [place] - [address] [place] [address] Phone: [phone] Fax: [phone]  [id number] - [place], [place] - [address] [facility] [address] [place] [address] Phone: [phone] Fax: [phone]  [id number] - [place], [place] - [address] [place] [address] Phone: [phone] Fax: [phone]  Patient is requesting a refill of the following medications: No prescriptions requested or ordered in this encounter   Date last filled: 08/05/21 Previously prescribed by Dr. Manson Passey  Lab Results      Component                Value               Date                      HGBA1C                   5.3                 06/20/2021                HGBA1C                   5.4                 09/11/2020                HGBA1C                   5.4                 03/06/2020           Lab Results      Component                Value               Date                      LDLCALC                  90                  07/26/2019                CREATININE               0.91                06/20/2021           Lab Results      Component                Value               Date                      VD25OH                   38.6                06/20/2021  VD25OH                   47.6                09/11/2020                VD25OH                   42.3                03/06/2020            BP Readings from Last 3 Encounters: 08/29/21 : 120/80 08/05/21 : 117/75 07/26/21 : (!) 133/97

## 2021-09-02 NOTE — Telephone Encounter (Signed)
Pt last seen by Dr. Brown.  

## 2021-09-02 NOTE — Telephone Encounter (Signed)
LAST APPOINTMENT DATE: 08/29/21 NEXT APPOINTMENT DATE: 09/24/21   [facility] - [place], [place] - [address] [place] [address] Phone: [phone] Fax: [phone]  [facility] [place], [place] - [address] [place] [address] Phone: [phone] Fax: [phone]  [id number] - [place], [place] - [address] AT [facility] [address] [place] [address] Phone: [phone] Fax: [phone]  [id number] - [place], [place] - [address] [place] [address] Phone: [phone] Fax: [phone]  Patient is requesting a refill of the following medications: Pending Prescriptions:                       Disp   Refills   topiramate (TOPAMAX) 25 MG tablet          30 tab*0       Sig: Take 1 tablet (25 mg total) by mouth daily.   Date last filled: 08/05/21 Previously prescribed by Dr Lawson Radar  Lab Results      Component                Value               Date                      HGBA1C                   5.3                 06/20/2021                HGBA1C                   5.4                 09/11/2020                HGBA1C                   5.4                 03/06/2020           Lab Results      Component                Value               Date                      LDLCALC                  90                  07/26/2019                CREATININE               0.91                06/20/2021           Lab Results      Component                Value               Date  VD25OH                   38.6                06/20/2021                VD25OH                   47.6                09/11/2020                VD25OH                   42.3                03/06/2020            BP Readings from Last 3 Encounters: 08/29/21 : 120/80 08/05/21 : 117/75 07/26/21 : (!) 133/97

## 2021-09-23 ENCOUNTER — Other Ambulatory Visit (INDEPENDENT_AMBULATORY_CARE_PROVIDER_SITE_OTHER): Payer: Self-pay | Admitting: Family Medicine

## 2021-09-23 DIAGNOSIS — F3289 Other specified depressive episodes: Secondary | ICD-10-CM

## 2021-09-24 ENCOUNTER — Encounter (INDEPENDENT_AMBULATORY_CARE_PROVIDER_SITE_OTHER): Payer: Self-pay | Admitting: Family Medicine

## 2021-09-24 ENCOUNTER — Ambulatory Visit (INDEPENDENT_AMBULATORY_CARE_PROVIDER_SITE_OTHER): Payer: 59 | Admitting: Family Medicine

## 2021-09-24 ENCOUNTER — Other Ambulatory Visit: Payer: Self-pay

## 2021-09-24 VITALS — BP 135/90 | HR 100 | Temp 98.3°F | Ht 67.0 in | Wt >= 6400 oz

## 2021-09-24 DIAGNOSIS — R632 Polyphagia: Secondary | ICD-10-CM | POA: Diagnosis not present

## 2021-09-24 DIAGNOSIS — Z6841 Body Mass Index (BMI) 40.0 and over, adult: Secondary | ICD-10-CM

## 2021-09-24 DIAGNOSIS — F3289 Other specified depressive episodes: Secondary | ICD-10-CM | POA: Diagnosis not present

## 2021-09-24 MED ORDER — TOPIRAMATE 25 MG PO TABS: 25.0000 mg | ORAL_TABLET | Freq: Every day | ORAL | 0 refills | Status: DC

## 2021-09-24 MED ORDER — BUPROPION HCL ER (SR) 150 MG PO TB12: 150.0000 mg | ORAL_TABLET | Freq: Two times a day (BID) | ORAL | 0 refills | Status: DC

## 2021-09-24 MED ORDER — PHENTERMINE HCL 37.5 MG PO CAPS: 37.5000 mg | ORAL_CAPSULE | ORAL | 0 refills | Status: DC

## 2021-09-24 NOTE — Progress Notes (Signed)
Chief Complaint:   OBESITY Catherine [name] is here to discuss her progress with her obesity treatment plan along with follow-up of her obesity related diagnoses. Catherine [name] is on the Category 4 Plan and states she is following her eating plan approximately 85% of the time. Catherine [name] states she is doing cardio 30 minutes 2 times per week.  Today's visit was #: 87 Starting weight: 468 lbs Starting date: 10/27/2016 Today's weight: 431 lbs Today's date: 09/24/2021 Total lbs lost to date: 37 Total lbs lost since last in-office visit: 0  Interim History: Catherine [name] had a good Thanksgiving but afterwards got a GI bug that lasted a couple of days. She is feeling better now. For a few days she did saltines,, ginger ale, and broth. Pt has no plans until Christmas Day when she will be traveling to [place]. She wants to continue on with meal plan. Pt feels like she needs a reset.  Subjective:   1. Polyphagia Catherine [name] is on phentermine/topiramate combo and notes some good control of symptoms. PDMP checked.  2. Other depression, with emotional eating Pt denies suicidal or homicidal ideations. She is on Wellbutrin.  Assessment/Plan:   1. Polyphagia Intensive lifestyle modifications are the first line treatment for this issue. We discussed several lifestyle modifications today and she will continue to work on diet, exercise and weight loss efforts. Orders and follow up as documented in patient record.  Counseling Polyphagia is excessive hunger. Causes can include: low blood sugars, hypERthyroidism, PMS, lack of sleep, stress, insulin resistance, diabetes, certain medications, and diets that are deficient in protein and fiber.   Refill- phentermine 37.5 MG capsule; Take 1 capsule (37.5 mg total) by mouth every morning.  Dispense: 30 capsule; Refill: 0 Refill- topiramate (TOPAMAX) 25 MG tablet; Take 1 tablet (25 mg total) by mouth daily.  Dispense: 30 tablet; Refill: 0  2. Other depression, with emotional  eating Behavior modification techniques were discussed today to help Catherine [name] deal with her emotional/non-hunger eating behaviors.  Orders and follow up as documented in patient record.   Refill- buPROPion (WELLBUTRIN SR) 150 MG 12 hr tablet; Take 1 tablet (150 mg total) by mouth 2 (two) times daily.  Dispense: 60 tablet; Refill: 0  3. Obesity with current BMI of 67.6  Catherine [name] is currently in the action stage of change. As such, her goal is to continue with weight loss efforts. She has agreed to the Category 4 Plan.   Exercise goals: All adults should avoid inactivity. Some physical activity is better than none, and adults who participate in any amount of physical activity gain some health benefits.  Behavioral modification strategies: increasing lean protein intake, meal planning and cooking strategies, keeping healthy foods in the home, and planning for success.  Catherine [name] has agreed to follow-up with our clinic in 3 weeks. She was informed of the importance of frequent follow-up visits to maximize her success with intensive lifestyle modifications for her multiple health conditions.   Objective:   Blood pressure 135/90, pulse 100, temperature 98.3 F (36.8 C), height 5\' 7"  (1.702 m), weight (!) 431 lb (195.5 kg), SpO2 100 %. Body mass index is 67.5 kg/m.  General: Cooperative, alert, well developed, in no acute distress. HEENT: Conjunctivae and lids unremarkable. Cardiovascular: Regular rhythm.  Lungs: Normal work of breathing. Neurologic: No focal deficits.   Lab Results  Component Value Date   CREATININE 0.91 06/20/2021   BUN 16 06/20/2021   NA 140 06/20/2021   K 4.5 06/20/2021   CL 100 06/20/2021  CO2 24 06/20/2021   Lab Results  Component Value Date   ALT 25 06/20/2021   AST 24 06/20/2021   ALKPHOS 61 06/20/2021   BILITOT 0.6 06/20/2021   Lab Results  Component Value Date   HGBA1C 5.3 06/20/2021   HGBA1C 5.4 09/11/2020   HGBA1C 5.4 03/06/2020   HGBA1C 5.5  11/22/2019   HGBA1C 5.4 07/26/2019   Lab Results  Component Value Date   INSULIN 29.4 (H) 06/20/2021   INSULIN 43.4 (H) 09/11/2020   INSULIN 39.2 (H) 03/06/2020   INSULIN 32.5 (H) 11/22/2019   INSULIN 41.7 (H) 07/26/2019   Lab Results  Component Value Date   TSH 2.160 07/26/2019   Lab Results  Component Value Date   CHOL 146 07/26/2019   HDL 46 07/26/2019   LDLCALC 90 07/26/2019   TRIG 45 07/26/2019   CHOLHDL 3.6 11/21/2015   Lab Results  Component Value Date   VD25OH 38.6 06/20/2021   VD25OH 47.6 09/11/2020   VD25OH 42.3 03/06/2020   Lab Results  Component Value Date   WBC 11.1 (H) 07/26/2019   HGB 12.9 07/26/2019   HCT 38.1 07/26/2019   MCV 88 07/26/2019   PLT 219 07/26/2019   Lab Results  Component Value Date   IRON 25 (L) 05/18/2017   TIBC 363 05/18/2017   FERRITIN 16 05/18/2017    Attestation Statements:   Reviewed by clinician on day of visit: allergies, medications, problem list, medical history, surgical history, family history, social history, and previous encounter notes.  Edmund Hilda, CMA, am acting as transcriptionist for Reuben Likes, MD.  I have reviewed the above documentation for accuracy and completeness, and I agree with the above. - Reuben Likes, MD

## 2021-09-25 ENCOUNTER — Other Ambulatory Visit: Payer: Self-pay | Admitting: Family Medicine

## 2021-09-28 ENCOUNTER — Other Ambulatory Visit (INDEPENDENT_AMBULATORY_CARE_PROVIDER_SITE_OTHER): Payer: Self-pay | Admitting: Bariatrics

## 2021-09-28 DIAGNOSIS — R632 Polyphagia: Secondary | ICD-10-CM

## 2021-10-07 ENCOUNTER — Other Ambulatory Visit: Payer: Self-pay | Admitting: Family Medicine

## 2021-10-07 ENCOUNTER — Other Ambulatory Visit (INDEPENDENT_AMBULATORY_CARE_PROVIDER_SITE_OTHER): Payer: Self-pay | Admitting: Family Medicine

## 2021-10-07 DIAGNOSIS — R632 Polyphagia: Secondary | ICD-10-CM

## 2021-10-07 DIAGNOSIS — R609 Edema, unspecified: Secondary | ICD-10-CM

## 2021-10-09 ENCOUNTER — Encounter: Payer: Self-pay | Admitting: Family Medicine

## 2021-10-09 ENCOUNTER — Other Ambulatory Visit: Payer: Self-pay

## 2021-10-09 ENCOUNTER — Telehealth: Payer: Self-pay | Admitting: Family Medicine

## 2021-10-09 ENCOUNTER — Ambulatory Visit (INDEPENDENT_AMBULATORY_CARE_PROVIDER_SITE_OTHER): Payer: 59 | Admitting: Family Medicine

## 2021-10-09 VITALS — BP 135/90 | HR 95 | Temp 97.7°F | Ht 67.0 in | Wt >= 6400 oz

## 2021-10-09 DIAGNOSIS — Z20822 Contact with and (suspected) exposure to covid-19: Secondary | ICD-10-CM | POA: Diagnosis not present

## 2021-10-09 MED ORDER — ALBUTEROL SULFATE HFA 108 (90 BASE) MCG/ACT IN AERS: 2.0000 | INHALATION_SPRAY | Freq: Four times a day (QID) | RESPIRATORY_TRACT | 0 refills | Status: AC | PRN

## 2021-10-09 MED ORDER — NIRMATRELVIR/RITONAVIR (PAXLOVID)TABLET
3.0000 | ORAL_TABLET | Freq: Two times a day (BID) | ORAL | 0 refills | Status: AC
Start: 2021-10-09 — End: 2021-10-14

## 2021-10-09 NOTE — Patient Instructions (Signed)
You are being tested for COVID today.  I am sending in a prescription for Paxil did, you will need to stop taking your bupropion while you are on it.  The pharmacy should also go through and check and make sure your other medications are safe to take while on it.  I am also sending in a rescue inhaler just in case you need it.

## 2021-10-09 NOTE — Telephone Encounter (Signed)
Patient dropped off DMV placard form. Last OV was 07/26/21. Placed in [employer].

## 2021-10-09 NOTE — Telephone Encounter (Signed)
Called patient. Patient reports fever, body aches, congestion, headache, cough and weakness. States that she lost her voice on Monday, however, other symptoms did not start until Tuesday. Patient states that she has to have a documented COVID test with doctor's note in order to receive COVID pay while she is out of work. Patient scheduled for this afternoon at 1:50 with Dr. Clayborne Artist.   Veronda Prude, RN

## 2021-10-09 NOTE — Progress Notes (Signed)
° ° °  SUBJECTIVE:   CHIEF COMPLAINT / HPI:   COVID Patient tested positive for COVID at home, but her work requires documentation from her physician. Patient is having fatigue, nausea, cough, congestion.  Patient states her symptoms began yesterday.  PERTINENT  PMH / PSH: Asthma, obesity  OBJECTIVE:   BP 135/90    Pulse 95    Temp 97.7 F (36.5 C)    Ht 5\' 7"  (1.702 m)    Wt (!) 446 lb 9.6 oz (202.6 kg)    SpO2 99%    BMI 69.95 kg/m   Gen: Appears unwell, NAD CV: RRR, no m/r/g appreciated, no peripheral edema Pulm: CTAB, no wheezes/crackles  ASSESSMENT/PLAN:   COVID Patient is a positive for COVID x2 but work requires documentation.  Patient with history of asthma and significant obesity, discussed pros and cons of Paxlovid.  GFR appropriate at 82 and LFTs unremarkable.  Medications reviewed and patient instructed to hold bupropion. Patient instructed to ensure pharmacy double checks her other medications prior to receiving prescription.  - Paxlovid prescribed - Rescue albuterol inhaler - Return precautions given - Work note with CDC recommendations given to patient   , DO [facility]

## 2021-10-09 NOTE — Telephone Encounter (Signed)
Clinical info completed on handicap form.  Place form in Dr. Mikey Bussing box for completion.  Aalaya Yadao, CMA

## 2021-10-10 LAB — SARS-COV-2, NAA 2 DAY TAT

## 2021-10-10 LAB — NOVEL CORONAVIRUS, NAA: SARS-CoV-2, NAA: DETECTED — AB

## 2021-10-15 ENCOUNTER — Encounter: Payer: Self-pay | Admitting: *Deleted

## 2021-10-15 ENCOUNTER — Telehealth: Payer: 59 | Admitting: Family Medicine

## 2021-10-15 DIAGNOSIS — U071 COVID-19: Secondary | ICD-10-CM

## 2021-10-15 NOTE — Telephone Encounter (Signed)
Attempted to call patient but voicemail not set up.  Will send a mychart message to let patient know that form is ready for pick up.  Laguana Desautel,CMA

## 2021-10-15 NOTE — Progress Notes (Signed)
Catherine [name]   Is on COVID treatment and feeling worse- advised in person visit

## 2021-10-16 ENCOUNTER — Encounter: Payer: Self-pay | Admitting: Nurse Practitioner

## 2021-10-16 ENCOUNTER — Telehealth: Payer: PRIVATE HEALTH INSURANCE | Admitting: Nurse Practitioner

## 2021-10-16 DIAGNOSIS — J4521 Mild intermittent asthma with (acute) exacerbation: Secondary | ICD-10-CM

## 2021-10-16 DIAGNOSIS — R0602 Shortness of breath: Secondary | ICD-10-CM | POA: Diagnosis not present

## 2021-10-16 MED ORDER — PREDNISONE 10 MG (21) PO TBPK: ORAL_TABLET | ORAL | 0 refills | Status: DC

## 2021-10-16 NOTE — Progress Notes (Signed)
Virtual Visit Consent   Catherine [name], you are scheduled for a virtual visit with Mary-Margaret Hassell Done, FNP, a [place] provider, today.     Just as with appointments in the office, your consent must be obtained to participate.  Your consent will be active for this visit and any virtual visit you may have with one of our providers in the next 365 days.     If you have a MyChart account, a copy of this consent can be sent to you electronically.  All virtual visits are billed to your insurance company just like a traditional visit in the office.    As this is a virtual visit, video technology does not allow for your provider to perform a traditional examination.  This may limit your provider's ability to fully assess your condition.  If your provider identifies any concerns that need to be evaluated in person or the need to arrange testing (such as labs, EKG, etc.), we will make arrangements to do so.     Although advances in technology are sophisticated, we cannot ensure that it will always work on either your end or our end.  If the connection with a video visit is poor, the visit may have to be switched to a telephone visit.  With either a video or telephone visit, we are not always able to ensure that we have a secure connection.     I need to obtain your verbal consent now.   Are you willing to proceed with your visit today? YES   Catherine [name] has provided verbal consent on 10/16/2021 for a virtual visit (video or telephone).   Mary-Margaret Hassell Done, FNP   Date: 10/16/2021 10:21 AM   Virtual Visit via Video Note   I, Mary-Margaret Hassell Done, connected with Catherine [name] ([id number], [birth year]) on 10/16/21 at 10:45 AM EST by a video-enabled telemedicine application and verified that I am speaking with the correct person using two identifiers.  Location: Patient: Virtual Visit Location Patient: Home Provider: Virtual Visit Location Provider: Mobile   I discussed the limitations  of evaluation and management by telemedicine and the availability of in person appointments. The patient expressed understanding and agreed to proceed.    History of Present Illness: Catherine [name] is a 41 y.o. who identifies as a female who was assigned female at birth, and is being seen today for SOB.  HPI: Shortness of Breath This is a new problem. The current episode started in the past 7 days. The problem occurs every few minutes. The problem has been gradually worsening. The average episode lasts 30 seconds. Associated symptoms include orthopnea and wheezing. Pertinent negatives include no chest pain, ear pain or sputum production. Nothing aggravates the symptoms. She has tried OTC cough suppressants for the symptoms.  She tested positive for covid on December 28. Was given antiviral and inhaler. SOB has not improved. Did evist yesterday and was told to see PCP face to face.  Review of Systems  HENT:  Negative for ear pain.   Respiratory:  Positive for shortness of breath and wheezing. Negative for sputum production.   Cardiovascular:  Positive for orthopnea. Negative for chest pain.   Problems:  Patient Active Problem List   Diagnosis Date Noted   Chronic pain syndrome 07/26/2021   Polyphagia 01/23/2021   Bursitis 01/01/2021   Pain in both lower extremities 11/16/2020   Peroneal neuropathy 11/16/2020   Acute otitis externa of both ears 03/02/2020   Class 3 severe obesity  with serious comorbidity and body mass index (BMI) greater than or equal to 70 in adult [facility]) 06/10/2017   Fluid retention 06/10/2017   Menorrhagia 05/18/2017   Sacral radiculopathy 02/04/2017   Insulin resistance 02/02/2017   At risk for violence to self related to depression 02/02/2017   Morbid obesity ([place]) 01/01/2017   Sore throat (viral) 11/21/2016   Vitamin D deficiency 11/12/2016   Asthma 07/25/2016   Abdominal pain 06/04/2016   Otitis, externa, infective 06/04/2016   Dysuria 03/20/2016   Gout  11/21/2015   Obesity, morbid, BMI 50 or higher ([place]) 05/14/2015   BIPOLAR DISORDER UNSPECIFIED 11/22/2008   Depression 12/10/2006   MIGRAINE, UNSPEC., W/O INTRACTABLE MIGRAINE 12/10/2006   GASTROESOPHAGEAL REFLUX, NO ESOPHAGITIS 12/10/2006   OSA (obstructive sleep apnea) 12/10/2006    Allergies:  Allergies  Allergen Reactions   Citrus Swelling   Medications:  Current Outpatient Medications:    albuterol (VENTOLIN HFA) 108 (90 Base) MCG/ACT inhaler, Inhale 2 puffs into the lungs every 6 (six) hours as needed for wheezing or shortness of breath., Disp: 8 g, Rfl: 0   aspirin-acetaminophen-caffeine (EXCEDRIN MIGRAINE) 250-250-65 MG per tablet, Take 2 tablets by mouth 2 (two) times daily as needed for migraine. For headache, Disp: , Rfl:    buPROPion (WELLBUTRIN SR) 150 MG 12 hr tablet, Take 1 tablet (150 mg total) by mouth 2 (two) times daily., Disp: 60 tablet, Rfl: 0   celecoxib (CELEBREX) 200 MG capsule, TAKE 1 CAPSULE (200 MG TOTAL) BY MOUTH DAILY AS NEEDED., Disp: 30 capsule, Rfl: 0   cetirizine (ZYRTEC) 10 MG tablet, Take 10 mg by mouth daily., Disp: , Rfl:    DULoxetine (CYMBALTA) 30 MG capsule, TAKE 1 CAPSULE BY MOUTH EVERY DAY, Disp: 90 capsule, Rfl: 1   furosemide (LASIX) 20 MG tablet, TAKE 1 TABLET BY MOUTH TWICE A DAY, Disp: 180 tablet, Rfl: 2   gabapentin (NEURONTIN) 300 MG capsule, TAKE 2 CAPSULES (600 MG TOTAL) BY MOUTH 3 TIMES A DAY, Disp: 60 capsule, Rfl: 1   pantoprazole (PROTONIX) 40 MG tablet, TAKE 1 TABLET (40 MG TOTAL) BY MOUTH DAILY. TAKE 30 MINUTES PRIOR TO BREAKFAST, Disp: 90 tablet, Rfl: 1   phentermine 37.5 MG capsule, Take 1 capsule (37.5 mg total) by mouth every morning., Disp: 30 capsule, Rfl: 0   topiramate (TOPAMAX) 25 MG tablet, Take 1 tablet (25 mg total) by mouth daily., Disp: 30 tablet, Rfl: 0   Vitamin D, Ergocalciferol, (DRISDOL) 1.25 MG (50000 UNIT) CAPS capsule, Take 1 capsule (50,000 Units total) by mouth every 7 (seven) days., Disp: 4 capsule, Rfl:  0  Observations/Objective: Patient is well-developed, well-nourished in no acute distress.  Resting comfortably  at home.  Head is normocephalic, atraumatic.  No labored breathing.  Speech is clear and coherent with logical content.  Patient is alert and oriented at baseline.  Voice hoarse Deep dry cough Exp wheezing noted   Assessment and Plan:  Catherine [name] in today with chief complaint of Shortness of Breath   1. Mild intermittent asthmatic bronchitis with acute exacerbation 2. SOB (shortness of breath) Force fluids Rest If no better tomorrow- need to go to the ED  Meds ordered this encounter  Medications   predniSONE (STERAPRED UNI-PAK 21 TAB) 10 MG (21) TBPK tablet    Sig: As directed x 6 days    Dispense:  21 tablet    Refill:  0    Order Specific Question:   Supervising Provider    Answer:   Noemi Chapel [  [id number]]       Follow Up Instructions: I discussed the assessment and treatment plan with the patient. The patient was provided an opportunity to ask questions and all were answered. The patient agreed with the plan and demonstrated an understanding of the instructions.  A copy of instructions were sent to the patient via MyChart.  The patient was advised to call back or seek an in-person evaluation if the symptoms worsen or if the condition fails to improve as anticipated.  Time:  I spent 8 minutes with the patient via telehealth technology discussing the above problems/concerns.    Mary-Margaret Hassell Done, FNP

## 2021-10-23 ENCOUNTER — Encounter (INDEPENDENT_AMBULATORY_CARE_PROVIDER_SITE_OTHER): Payer: Self-pay | Admitting: Family Medicine

## 2021-10-23 ENCOUNTER — Ambulatory Visit (INDEPENDENT_AMBULATORY_CARE_PROVIDER_SITE_OTHER): Payer: PRIVATE HEALTH INSURANCE | Admitting: Family Medicine

## 2021-10-23 ENCOUNTER — Other Ambulatory Visit: Payer: Self-pay

## 2021-10-23 VITALS — BP 119/80 | HR 107 | Temp 97.6°F | Ht 67.0 in | Wt >= 6400 oz

## 2021-10-23 DIAGNOSIS — E559 Vitamin D deficiency, unspecified: Secondary | ICD-10-CM | POA: Diagnosis not present

## 2021-10-23 DIAGNOSIS — R632 Polyphagia: Secondary | ICD-10-CM

## 2021-10-23 DIAGNOSIS — E669 Obesity, unspecified: Secondary | ICD-10-CM | POA: Diagnosis not present

## 2021-10-23 DIAGNOSIS — Z6841 Body Mass Index (BMI) 40.0 and over, adult: Secondary | ICD-10-CM

## 2021-10-23 MED ORDER — VITAMIN D (ERGOCALCIFEROL) 1.25 MG (50000 UNIT) PO CAPS: 50000.0000 [IU] | ORAL_CAPSULE | ORAL | 0 refills | Status: DC

## 2021-10-23 MED ORDER — PHENTERMINE HCL 37.5 MG PO CAPS: 37.5000 mg | ORAL_CAPSULE | ORAL | 0 refills | Status: DC

## 2021-10-24 NOTE — Progress Notes (Signed)
Chief Complaint:   OBESITY Catherine [name] is here to discuss her progress with her obesity treatment plan along with follow-up of her obesity related diagnoses. Catherine [name] is on the Category 4 Plan and states she is following her eating plan approximately 80% of the time. Catherine [name] states she is not currently exercising.  Today's visit was #: 88 Starting weight: 468 lbs Starting date: 10/27/2016 Today's weight: 429 lbs Today's date: 10/23/2021 Total lbs lost to date: 39 Total lbs lost since last in-office visit: 2  Interim History: Pt had COVID right after Christmas. She did experience an increase in work of breathing and needed her rescue inhaler and prednisone pack after quarantine. She lost her sense of smell but not taste. She was still able to follow meal plan but had to overly season food to be able to taste it.  Subjective:   1. Vitamin D deficiency Pt denies nausea, vomiting, and muscle weakness but notes fatigue. Her last Vit D level was 38.6.  2. Polyphagia Pt is still able to lose with phentermine- this is month 3 on 97.5. PDMP checked. Pt previously on GLP-1 (insurance stopped covering).  Assessment/Plan:   1. Vitamin D deficiency Low Vitamin D level contributes to fatigue and are associated with obesity, breast, and colon cancer. She agrees to continue to take prescription Vitamin D 50,000 IU every week and will follow-up for routine testing of Vitamin D, at least 2-3 times per year to avoid over-replacement.  Refill- Vitamin D, Ergocalciferol, (DRISDOL) 1.25 MG (50000 UNIT) CAPS capsule; Take 1 capsule (50,000 Units total) by mouth every 7 (seven) days.  Dispense: 4 capsule; Refill: 0  2. Polyphagia Intensive lifestyle modifications are the first line treatment for this issue. We discussed several lifestyle modifications today and she will continue to work on diet, exercise and weight loss efforts. Orders and follow up as documented in patient record. At this time, the benefit  on continuation of phentermine outweighs the risks of longer term tx. Total weight loss on 37.5 mg is 3 lb.  Counseling Polyphagia is excessive hunger. Causes can include: low blood sugars, hypERthyroidism, PMS, lack of sleep, stress, insulin resistance, diabetes, certain medications, and diets that are deficient in protein and fiber.   Refill- phentermine 37.5 MG capsule; Take 1 capsule (37.5 mg total) by mouth every morning.  Dispense: 30 capsule; Refill: 0  3. Obesity with current BMI of 67.3  Catherine [name] is currently in the action stage of change. As such, her goal is to continue with weight loss efforts. She has agreed to the Category 4 Plan.   Exercise goals: No exercise has been prescribed at this time.  Behavioral modification strategies: increasing lean protein intake, meal planning and cooking strategies, keeping healthy foods in the home, and planning for success.  [place] has agreed to follow-up with our clinic in 3 weeks. She was informed of the importance of frequent follow-up visits to maximize her success with intensive lifestyle modifications for her multiple health conditions.   Objective:   Blood pressure 119/80, pulse (!) 107, temperature 97.6 F (36.4 C), height 5\' 7"  (1.702 m), weight (!) 429 lb (194.6 kg), SpO2 97 %. Body mass index is 67.19 kg/m.  General: Cooperative, alert, well developed, in no acute distress. HEENT: Conjunctivae and lids unremarkable. Cardiovascular: Regular rhythm.  Lungs: Normal work of breathing. Neurologic: No focal deficits.   Lab Results  Component Value Date   CREATININE 0.91 06/20/2021   BUN 16 06/20/2021   NA 140 06/20/2021  K 4.5 06/20/2021   CL 100 06/20/2021   CO2 24 06/20/2021   Lab Results  Component Value Date   ALT 25 06/20/2021   AST 24 06/20/2021   ALKPHOS 61 06/20/2021   BILITOT 0.6 06/20/2021   Lab Results  Component Value Date   HGBA1C 5.3 06/20/2021   HGBA1C 5.4 09/11/2020   HGBA1C 5.4 03/06/2020    HGBA1C 5.5 11/22/2019   HGBA1C 5.4 07/26/2019   Lab Results  Component Value Date   INSULIN 29.4 (H) 06/20/2021   INSULIN 43.4 (H) 09/11/2020   INSULIN 39.2 (H) 03/06/2020   INSULIN 32.5 (H) 11/22/2019   INSULIN 41.7 (H) 07/26/2019   Lab Results  Component Value Date   TSH 2.160 07/26/2019   Lab Results  Component Value Date   CHOL 146 07/26/2019   HDL 46 07/26/2019   LDLCALC 90 07/26/2019   TRIG 45 07/26/2019   CHOLHDL 3.6 11/21/2015   Lab Results  Component Value Date   VD25OH 38.6 06/20/2021   VD25OH 47.6 09/11/2020   VD25OH 42.3 03/06/2020   Lab Results  Component Value Date   WBC 11.1 (H) 07/26/2019   HGB 12.9 07/26/2019   HCT 38.1 07/26/2019   MCV 88 07/26/2019   PLT 219 07/26/2019   Lab Results  Component Value Date   IRON 25 (L) 05/18/2017   TIBC 363 05/18/2017   FERRITIN 16 05/18/2017    Attestation Statements:   Reviewed by clinician on day of visit: allergies, medications, problem list, medical history, surgical history, family history, social history, and previous encounter notes.  Edmund Hilda, CMA, am acting as transcriptionist for Reuben Likes, MD.   I have reviewed the above documentation for accuracy and completeness, and I agree with the above. - Reuben Likes, MD

## 2021-10-28 ENCOUNTER — Other Ambulatory Visit: Payer: Self-pay | Admitting: Family Medicine

## 2021-10-31 ENCOUNTER — Other Ambulatory Visit: Payer: Self-pay | Admitting: Family Medicine

## 2021-11-07 ENCOUNTER — Other Ambulatory Visit (INDEPENDENT_AMBULATORY_CARE_PROVIDER_SITE_OTHER): Payer: Self-pay | Admitting: Family Medicine

## 2021-11-07 ENCOUNTER — Encounter (INDEPENDENT_AMBULATORY_CARE_PROVIDER_SITE_OTHER): Payer: Self-pay

## 2021-11-07 DIAGNOSIS — R632 Polyphagia: Secondary | ICD-10-CM

## 2021-11-15 ENCOUNTER — Telehealth: Payer: PRIVATE HEALTH INSURANCE | Admitting: Family Medicine

## 2021-11-15 DIAGNOSIS — M7061 Trochanteric bursitis, right hip: Secondary | ICD-10-CM | POA: Diagnosis not present

## 2021-11-15 MED ORDER — TIZANIDINE HCL 2 MG PO CAPS
2.0000 mg | ORAL_CAPSULE | Freq: Every evening | ORAL | 0 refills | Status: DC | PRN
Start: 2021-11-15 — End: 2022-03-07

## 2021-11-15 NOTE — Patient Instructions (Signed)
Bursitis ?Bursitis is when the fluid-filled sac (bursa) that covers and protects a joint is swollen (inflamed). Bursitis is most common near joints such as the knees, elbows, hips, and shoulders. It can cause pain and stiffness. ?Follow these instructions at home: ?Medicines ?Take over-the-counter and prescription medicines only as told by your doctor. ?If you were prescribed an antibiotic medicine, take it as told by your doctor. Do not stop taking it even if you start to feel better. ?General instructions ? ?Rest the affected area as told by your doctor. ?If you can, raise (elevate) the affected area above the level of your heart while you are sitting or lying down. ?Avoid doing things that make the pain worse. ?Use a splint, brace, pad, or walking aid as told by your doctor. ?If directed, put ice on the affected area: ?If you have a removable splint or brace, take it off as told by your doctor. ?Put ice in a plastic bag. ?Place a towel between your skin and the bag, or between the splint or brace and the bag. ?Leave the ice on for 20 minutes, 2-3 times a day. ?Keep all follow-up visits as told by your doctor. This is important. ?Preventing symptoms ?Do these things to help you not have symptoms again: ?Wear knee pads if you kneel often. ?Wear sturdy running or walking shoes that fit you well. ?Take a lot of breaks during activities that involve doing the same movements again and again. ?Before you do any activity that takes a lot of effort, get your body ready by stretching. ?Stay at a healthy weight or lose weight if your doctor says you should. If you need help doing this, ask your doctor. ?Exercise often. If you start any new physical activity, do it slowly. ?Contact a doctor if you: ?Have a fever. ?Have chills. ?Have symptoms that do not get better with treatment or home care. ?Summary ?Bursitis is when the fluid-filled sac (bursa) that covers and protects a joint is swollen. ?Rest the affected area as told  by your doctor. ?Avoid doing things that make the pain worse. ?Put ice on the affected area as told by your doctor. ?This information is not intended to replace advice given to you by your health care provider. Make sure you discuss any questions you have with your health care provider. ?Document Revised: 03/07/2020 Document Reviewed: 04/04/2020 ?Elsevier Patient Education ? 2022 [employer] ? ?

## 2021-11-15 NOTE — Progress Notes (Signed)
Virtual Visit Consent   Catherine [name], you are scheduled for a virtual visit with a [facility] provider today.     Just as with appointments in the office, your consent must be obtained to participate.  Your consent will be active for this visit and any virtual visit you may have with one of our providers in the next 365 days.     If you have a MyChart account, a copy of this consent can be sent to you electronically.  All virtual visits are billed to your insurance company just like a traditional visit in the office.    As this is a virtual visit, video technology does not allow for your provider to perform a traditional examination.  This may limit your provider's ability to fully assess your condition.  If your provider identifies any concerns that need to be evaluated in person or the need to arrange testing (such as labs, EKG, etc.), we will make arrangements to do so.     Although advances in technology are sophisticated, we cannot ensure that it will always work on either your end or our end.  If the connection with a video visit is poor, the visit may have to be switched to a telephone visit.  With either a video or telephone visit, we are not always able to ensure that we have a secure connection.     I need to obtain your verbal consent now.   Are you willing to proceed with your visit today?    Catherine [name] has provided verbal consent on 11/15/2021 for a virtual visit (video or telephone).   Catherine Mayo, NP   Date: 11/15/2021 9:24 AM   Virtual Visit via Video Note   I, Catherine [name], connected with  Catherine [name]  ([id number], 01-Oct-1981) on 11/15/21 at  9:30 AM EST by a video-enabled telemedicine application and verified that I am speaking with the correct person using two identifiers.  Location: Patient: Virtual Visit Location Patient: Home Provider: Virtual Visit Location Provider: Home Office   I discussed the limitations of evaluation and management by  telemedicine and the availability of in person appointments. The patient expressed understanding and agreed to proceed.    History of Present Illness: Catherine [name] is a 41 y.o. who identifies as a female who was assigned female at birth, and is being seen today for right hip pain. Hx of bursitis. Flared up starting yesterday.  Pain level is 3-4/10 when walking, and when laying down 5-6/10 when pressure of laying on it occurs. Pain increased bending. Pain is described as deep achy. Denies recent trauma or injuries to the hip. Denies radiation of pain into groin, but reports some down the side of leg. Denies changes in sensation or strength of the leg. Denies spine pain. Was taking zanaflex at night to help with pain while sleeping. Needs refill of this.   Willing to go see PCP if medication does not help, as she might need joint injections. She recently had pred dose pk due to covid, is on celerex and cymbalta for leg pains and polyphaghia. Hx of bursitis in this hip as well.    Problems:  Patient Active Problem List   Diagnosis Date Noted   Chronic pain syndrome 07/26/2021   Polyphagia 01/23/2021   Bursitis 01/01/2021   Pain in both lower extremities 11/16/2020   Peroneal neuropathy 11/16/2020   Acute otitis externa of both ears 03/02/2020   Class 3 severe obesity  with serious comorbidity and body mass index (BMI) greater than or equal to 70 in adult [facility]) 06/10/2017   Fluid retention 06/10/2017   Menorrhagia 05/18/2017   Sacral radiculopathy 02/04/2017   Insulin resistance 02/02/2017   At risk for violence to self related to depression 02/02/2017   Morbid obesity ([place]) 01/01/2017   Sore throat (viral) 11/21/2016   Vitamin D deficiency 11/12/2016   Asthma 07/25/2016   Abdominal pain 06/04/2016   Otitis, externa, infective 06/04/2016   Dysuria 03/20/2016   Gout 11/21/2015   Obesity, morbid, BMI 50 or higher ([place]) 05/14/2015   BIPOLAR DISORDER UNSPECIFIED 11/22/2008   Depression  12/10/2006   MIGRAINE, UNSPEC., W/O INTRACTABLE MIGRAINE 12/10/2006   GASTROESOPHAGEAL REFLUX, NO ESOPHAGITIS 12/10/2006   OSA (obstructive sleep apnea) 12/10/2006    Allergies:  Allergies  Allergen Reactions   Citrus Swelling   Medications:  Current Outpatient Medications:    albuterol (VENTOLIN HFA) 108 (90 Base) MCG/ACT inhaler, Inhale 2 puffs into the lungs every 6 (six) hours as needed for wheezing or shortness of breath., Disp: 8 g, Rfl: 0   aspirin-acetaminophen-caffeine (EXCEDRIN MIGRAINE) 250-250-65 MG per tablet, Take 2 tablets by mouth 2 (two) times daily as needed for migraine. For headache, Disp: , Rfl:    buPROPion (WELLBUTRIN SR) 150 MG 12 hr tablet, Take 1 tablet (150 mg total) by mouth 2 (two) times daily., Disp: 60 tablet, Rfl: 0   celecoxib (CELEBREX) 200 MG capsule, TAKE 1 CAPSULE (200 MG TOTAL) BY MOUTH DAILY AS NEEDED., Disp: 30 capsule, Rfl: 0   cetirizine (ZYRTEC) 10 MG tablet, Take 10 mg by mouth daily., Disp: , Rfl:    DULoxetine (CYMBALTA) 30 MG capsule, TAKE 1 CAPSULE BY MOUTH EVERY DAY, Disp: 90 capsule, Rfl: 1   furosemide (LASIX) 20 MG tablet, TAKE 1 TABLET BY MOUTH TWICE A DAY, Disp: 180 tablet, Rfl: 2   gabapentin (NEURONTIN) 300 MG capsule, TAKE 2 CAPSULES (600 MG TOTAL) BY MOUTH 3 TIMES A DAY, Disp: 60 capsule, Rfl: 1   pantoprazole (PROTONIX) 40 MG tablet, TAKE 1 TABLET (40 MG TOTAL) BY MOUTH DAILY. TAKE 30 MINUTES PRIOR TO BREAKFAST, Disp: 90 tablet, Rfl: 1   phentermine 37.5 MG capsule, Take 1 capsule (37.5 mg total) by mouth every morning., Disp: 30 capsule, Rfl: 0   predniSONE (STERAPRED UNI-PAK 21 TAB) 10 MG (21) TBPK tablet, As directed x 6 days, Disp: 21 tablet, Rfl: 0   topiramate (TOPAMAX) 25 MG tablet, Take 1 tablet (25 mg total) by mouth daily., Disp: 30 tablet, Rfl: 0   Vitamin D, Ergocalciferol, (DRISDOL) 1.25 MG (50000 UNIT) CAPS capsule, Take 1 capsule (50,000 Units total) by mouth every 7 (seven) days., Disp: 4 capsule, Rfl:  0  Observations/Objective: Patient is well-developed, well-nourished in no acute distress.  Resting comfortably  at home.  Head is normocephalic, atraumatic.  No labored breathing.  Speech is clear and coherent with logical content.  Patient is alert and oriented at baseline.  Movement of hip intact  Assessment and Plan:  1. Trochanteric bursitis of right hip HX of this, advised of in person if repeat zanaflex is not of benefit She might benefit from direct joint injection over repeated pred dose pk (had recently due to covid).  Refill of zanaflex provided, f/u with PCP advised   Reviewed side effects, risks and benefits of medication.    Patient acknowledged agreement and understanding of the plan.    - tizanidine (ZANAFLEX) 2 MG capsule; Take 1-2 capsules (2-4 mg total)  by mouth at bedtime as needed for muscle spasms.  Dispense: 20 capsule; Refill: 0   Follow Up Instructions: I discussed the assessment and treatment plan with the patient. The patient was provided an opportunity to ask questions and all were answered. The patient agreed with the plan and demonstrated an understanding of the instructions.  A copy of instructions were sent to the patient via MyChart unless otherwise noted below.     The patient was advised to call back or seek an in-person evaluation if the symptoms worsen or if the condition fails to improve as anticipated.  Time:  I spent 10 minutes with the patient via telehealth technology discussing the above problems/concerns.    Catherine Mayo, NP

## 2021-11-18 ENCOUNTER — Other Ambulatory Visit: Payer: Self-pay

## 2021-11-18 ENCOUNTER — Encounter (INDEPENDENT_AMBULATORY_CARE_PROVIDER_SITE_OTHER): Payer: Self-pay | Admitting: Family Medicine

## 2021-11-18 ENCOUNTER — Ambulatory Visit (INDEPENDENT_AMBULATORY_CARE_PROVIDER_SITE_OTHER): Payer: PRIVATE HEALTH INSURANCE | Admitting: Family Medicine

## 2021-11-18 VITALS — BP 112/24 | HR 101 | Temp 97.8°F | Ht 67.0 in | Wt >= 6400 oz

## 2021-11-18 DIAGNOSIS — R632 Polyphagia: Secondary | ICD-10-CM

## 2021-11-18 DIAGNOSIS — Z6841 Body Mass Index (BMI) 40.0 and over, adult: Secondary | ICD-10-CM

## 2021-11-18 DIAGNOSIS — E669 Obesity, unspecified: Secondary | ICD-10-CM | POA: Diagnosis not present

## 2021-11-18 DIAGNOSIS — E559 Vitamin D deficiency, unspecified: Secondary | ICD-10-CM

## 2021-11-18 DIAGNOSIS — F3289 Other specified depressive episodes: Secondary | ICD-10-CM | POA: Diagnosis not present

## 2021-11-18 MED ORDER — WEGOVY 0.25 MG/0.5ML ~~LOC~~ SOAJ: 0.2500 mg | SUBCUTANEOUS | 0 refills | Status: DC

## 2021-11-18 MED ORDER — VITAMIN D (ERGOCALCIFEROL) 1.25 MG (50000 UNIT) PO CAPS: 50000.0000 [IU] | ORAL_CAPSULE | ORAL | 0 refills | Status: DC

## 2021-11-18 MED ORDER — BUPROPION HCL ER (SR) 150 MG PO TB12: 150.0000 mg | ORAL_TABLET | Freq: Two times a day (BID) | ORAL | 0 refills | Status: DC

## 2021-11-18 MED ORDER — TOPIRAMATE 25 MG PO TABS: 25.0000 mg | ORAL_TABLET | Freq: Every day | ORAL | 0 refills | Status: DC

## 2021-11-18 NOTE — Progress Notes (Signed)
Chief Complaint:   OBESITY Catherine [name] is here to discuss her progress with her obesity treatment plan along with follow-up of her obesity related diagnoses. Catherine [name] is on the Category 4 Plan and states she is following her eating plan approximately 77% of the time. Catherine [name] states she is walking 2 minutes 4 times per week.  Today's visit was #: 89 Starting weight: 468 lbs Starting date: 10/27/2016 Today's weight: 432 lbs Today's date: 11/18/2021 Total lbs lost to date: 36 Total lbs lost since last in-office visit: 0  Interim History: Pt has had a good few weeks- work and children busy. She is trying to ensure family is well cared for. Pt is doing ok with current stress. She is trying to get into some sort of routine. Food wise, there have been a few days she didn't have food choices at home to always get everything in. She is not sure what she is doing for her upcoming birthday.  Subjective:   1. Polyphagia Pt is on Topamax and phentermine. She is not really seeing any real weight loss on increased dose of phentermine. Couldn't check PDMP.  2. Vitamin D deficiency Pt denies nausea, vomiting, and muscle weakness but notes fatigue. She is on prescription Vit D. Her last Vit D level was 38.6.  3. Other depression, with emotional eating Pt is on Wellbutrin with some help with cravings.  Assessment/Plan:   1. Polyphagia Intensive lifestyle modifications are the first line treatment for this issue. We discussed several lifestyle modifications today and she will continue to work on diet, exercise and weight loss efforts. Orders and follow up as documented in patient record.  Counseling Polyphagia is excessive hunger. Causes can include: low blood sugars, hypERthyroidism, PMS, lack of sleep, stress, insulin resistance, diabetes, certain medications, and diets that are deficient in protein and fiber.   Refill- topiramate (TOPAMAX) 25 MG tablet; Take 1 tablet (25 mg total) by mouth daily.   Dispense: 30 tablet; Refill: 0  2. Vitamin D deficiency Low Vitamin D level contributes to fatigue and are associated with obesity, breast, and colon cancer. She agrees to continue to take prescription Vitamin D @50 ,000 IU every week and will follow-up for routine testing of Vitamin D, at least 2-3 times per year to avoid over-replacement.  Refill- Vitamin D, Ergocalciferol, (DRISDOL) 1.25 MG (50000 UNIT) CAPS capsule; Take 1 capsule (50,000 Units total) by mouth every 7 (seven) days.  Dispense: 4 capsule; Refill: 0  3. Other depression, with emotional eating Behavior modification techniques were discussed today to help Catherine [name] deal with her emotional/non-hunger eating behaviors.  Orders and follow up as documented in patient record.   Refill- buPROPion (WELLBUTRIN SR) 150 MG 12 hr tablet; Take 1 tablet (150 mg total) by mouth 2 (two) times daily.  Dispense: 60 tablet; Refill: 0  4. Obesity with current BMI of 67.8 Catherine [name] is currently in the action stage of change. As such, her goal is to continue with weight loss efforts. She has agreed to the Category 4 Plan.   We discussed various medication options to help Catherine [name] with her weight loss efforts and we both agreed to start Wegovy 0.25 mg.  Start- Semaglutide-Weight Management (WEGOVY) 0.25 MG/0.5ML SOAJ; Inject 0.25 mg into the skin once a week.  Dispense: 2 mL; Refill: 0  Exercise goals: All adults should avoid inactivity. Some physical activity is better than none, and adults who participate in any amount of physical activity gain some health benefits.  Behavioral modification strategies: increasing  lean protein intake, meal planning and cooking strategies, and keeping healthy foods in the home.  Catherine [name] has agreed to follow-up with our clinic in 4 weeks. She was informed of the importance of frequent follow-up visits to maximize her success with intensive lifestyle modifications for her multiple health conditions.   Objective:   Blood  pressure (!) 112/24, pulse (!) 101, temperature 97.8 F (36.6 C), height 5\' 7"  (1.702 m), weight (!) 432 lb (196 kg), SpO2 100 %. Body mass index is 67.66 kg/m.  General: Cooperative, alert, well developed, in no acute distress. HEENT: Conjunctivae and lids unremarkable. Cardiovascular: Regular rhythm.  Lungs: Normal work of breathing. Neurologic: No focal deficits.   Lab Results  Component Value Date   CREATININE 0.91 06/20/2021   BUN 16 06/20/2021   NA 140 06/20/2021   K 4.5 06/20/2021   CL 100 06/20/2021   CO2 24 06/20/2021   Lab Results  Component Value Date   ALT 25 06/20/2021   AST 24 06/20/2021   ALKPHOS 61 06/20/2021   BILITOT 0.6 06/20/2021   Lab Results  Component Value Date   HGBA1C 5.3 06/20/2021   HGBA1C 5.4 09/11/2020   HGBA1C 5.4 03/06/2020   HGBA1C 5.5 11/22/2019   HGBA1C 5.4 07/26/2019   Lab Results  Component Value Date   INSULIN 29.4 (H) 06/20/2021   INSULIN 43.4 (H) 09/11/2020   INSULIN 39.2 (H) 03/06/2020   INSULIN 32.5 (H) 11/22/2019   INSULIN 41.7 (H) 07/26/2019   Lab Results  Component Value Date   TSH 2.160 07/26/2019   Lab Results  Component Value Date   CHOL 146 07/26/2019   HDL 46 07/26/2019   LDLCALC 90 07/26/2019   TRIG 45 07/26/2019   CHOLHDL 3.6 11/21/2015   Lab Results  Component Value Date   VD25OH 38.6 06/20/2021   VD25OH 47.6 09/11/2020   VD25OH 42.3 03/06/2020   Lab Results  Component Value Date   WBC 11.1 (H) 07/26/2019   HGB 12.9 07/26/2019   HCT 38.1 07/26/2019   MCV 88 07/26/2019   PLT 219 07/26/2019   Lab Results  Component Value Date   IRON 25 (L) 05/18/2017   TIBC 363 05/18/2017   FERRITIN 16 05/18/2017   Attestation Statements:   Reviewed by clinician on day of visit: allergies, medications, problem list, medical history, surgical history, family history, social history, and previous encounter notes.  Coral Ceo, CMA, am acting as transcriptionist for Coralie Common, MD.   I have  reviewed the above documentation for accuracy and completeness, and I agree with the above. - Coralie Common, MD

## 2021-11-19 ENCOUNTER — Encounter (INDEPENDENT_AMBULATORY_CARE_PROVIDER_SITE_OTHER): Payer: Self-pay | Admitting: Family Medicine

## 2021-11-19 NOTE — Telephone Encounter (Signed)
Dr.Ukleja 

## 2021-11-19 NOTE — Telephone Encounter (Signed)
Can you please check PA? Thanks, Taurus [place]amo

## 2021-11-21 ENCOUNTER — Telehealth (INDEPENDENT_AMBULATORY_CARE_PROVIDER_SITE_OTHER): Payer: Self-pay | Admitting: Family Medicine

## 2021-11-21 ENCOUNTER — Encounter (INDEPENDENT_AMBULATORY_CARE_PROVIDER_SITE_OTHER): Payer: Self-pay

## 2021-11-21 NOTE — Telephone Encounter (Signed)
I sent patient a message in mychart. Prior authorization has already being started. We are waiting on a response from the insurance.  °

## 2021-11-21 NOTE — Telephone Encounter (Signed)
I sent patient a message in mychart. Prior authorization has already being started. We are waiting on a response from the insurance.

## 2021-11-21 NOTE — Telephone Encounter (Signed)
Could you please check the status? Thanks

## 2021-11-21 NOTE — Telephone Encounter (Signed)
Patient called to check status of mychart message sent regarding Wegovy.

## 2021-11-25 ENCOUNTER — Encounter (INDEPENDENT_AMBULATORY_CARE_PROVIDER_SITE_OTHER): Payer: Self-pay

## 2021-11-29 ENCOUNTER — Other Ambulatory Visit: Payer: Self-pay | Admitting: Family Medicine

## 2021-12-14 ENCOUNTER — Other Ambulatory Visit (INDEPENDENT_AMBULATORY_CARE_PROVIDER_SITE_OTHER): Payer: Self-pay | Admitting: Family Medicine

## 2021-12-14 DIAGNOSIS — R632 Polyphagia: Secondary | ICD-10-CM

## 2021-12-16 ENCOUNTER — Encounter (INDEPENDENT_AMBULATORY_CARE_PROVIDER_SITE_OTHER): Payer: Self-pay | Admitting: Family Medicine

## 2021-12-16 ENCOUNTER — Other Ambulatory Visit: Payer: Self-pay | Admitting: Family Medicine

## 2021-12-16 ENCOUNTER — Ambulatory Visit (INDEPENDENT_AMBULATORY_CARE_PROVIDER_SITE_OTHER): Payer: PRIVATE HEALTH INSURANCE | Admitting: Family Medicine

## 2021-12-16 ENCOUNTER — Other Ambulatory Visit: Payer: Self-pay

## 2021-12-16 VITALS — BP 119/84 | HR 93 | Temp 98.2°F | Ht 67.0 in | Wt >= 6400 oz

## 2021-12-16 DIAGNOSIS — R632 Polyphagia: Secondary | ICD-10-CM

## 2021-12-16 DIAGNOSIS — Z6841 Body Mass Index (BMI) 40.0 and over, adult: Secondary | ICD-10-CM

## 2021-12-16 DIAGNOSIS — E669 Obesity, unspecified: Secondary | ICD-10-CM

## 2021-12-16 DIAGNOSIS — E559 Vitamin D deficiency, unspecified: Secondary | ICD-10-CM

## 2021-12-16 MED ORDER — WEGOVY 0.25 MG/0.5ML ~~LOC~~ SOAJ: 0.2500 mg | SUBCUTANEOUS | 0 refills | Status: DC

## 2021-12-16 MED ORDER — TOPIRAMATE 25 MG PO TABS: 25.0000 mg | ORAL_TABLET | Freq: Every day | ORAL | 0 refills | Status: DC

## 2021-12-16 MED ORDER — VITAMIN D (ERGOCALCIFEROL) 1.25 MG (50000 UNIT) PO CAPS: 50000.0000 [IU] | ORAL_CAPSULE | ORAL | 0 refills | Status: DC

## 2021-12-16 NOTE — Progress Notes (Signed)
? ? ? ?Chief Complaint:  ? ?OBESITY ?Catherine [name] is here to discuss her progress with her obesity treatment plan along with follow-up of her obesity related diagnoses. Catherine [name] is on the Category 4 Plan and states she is following her eating plan approximately 80% of the time. Catherine [name] states she is walking for 30 minutes 2 times per week. ? ?Today's visit was #: 90 ?Starting weight: 468 lbs ?Starting date: 10/27/2016 ?Today's weight: 433 lbs ?Today's date: 12/16/2021 ?Total lbs lost to date: 35 lbs ?Total lbs lost since last in-office visit: 0 ? ?Interim History: Catherine [name] recently celebrated wedding anniversary and her birthday over the last few weeks. She did have a bit of food indulgences. For the next few months she has no activities or celebrations. She is getting ready to do Catherine [name] with her church. She is doing well on Wegovy with no gastrointestinal side effects.  ? ?Subjective:  ? ?1. Polyphagia ?Catherine [name] is currently on topiramate. She has no side effects such as like brain fog.  ? ?2. Vitamin D deficiency ?Catherine [name] is on prescription Vitamin D. She notes no nausea, vomiting and muscle weakness. She notes fatigue.  ? ?Assessment/Plan:  ? ?1. Polyphagia ?Intensive lifestyle modifications are the first line treatment for this issue. We will refill Topiramate 25 mg by mouth for 1 month with no refills. We discussed several lifestyle modifications today and she will continue to work on diet, exercise and weight loss efforts. Orders and follow up as documented in patient record. ? ?Counseling ?Polyphagia is excessive hunger. ?Causes can include: low blood sugars, hypERthyroidism, PMS, lack of sleep, stress, insulin resistance, diabetes, certain medications, and diets that are deficient in protein and fiber.   ? ?- topiramate (TOPAMAX) 25 MG tablet; Take 1 tablet (25 mg total) by mouth daily.  Dispense: 30 tablet; Refill: 0 ? ?2. Vitamin D deficiency ?Low Vitamin D level contributes to fatigue and are associated with  obesity, breast, and colon cancer. We will refill prescription Vitamin D 50,000 IU every week for 1 month with no refills and Catherine [name] will follow-up for routine testing of Vitamin D, at least 2-3 times per year to avoid over-replacement. ? ?- Vitamin D, Ergocalciferol, (DRISDOL) 1.25 MG (50000 UNIT) CAPS capsule; Take 1 capsule (50,000 Units total) by mouth every 7 (seven) days.  Dispense: 4 capsule; Refill: 0 ? ?3. Obesity with current BMI of 67.8 ?Catherine [name] is currently in the action stage of change. As such, her goal is to continue with weight loss efforts. She has agreed to the Vegetarian Plan plus 600 calories.  ? ?We will refill Wegovy 0.25 mg subcutaneous weekly for 1 month with no refills. Catherine [name] will increase walking to 3 times per week. ? ?- Semaglutide-Weight Management (WEGOVY) 0.25 MG/0.5ML SOAJ; Inject 0.25 mg into the skin once a week.  Dispense: 2 mL; Refill: 0 ? ?Exercise goals: All adults should avoid inactivity. Some physical activity is better than none, and adults who participate in any amount of physical activity gain some health benefits. ? ?Behavioral modification strategies: increasing lean protein intake, meal planning and cooking strategies, keeping healthy foods in the home, and planning for success. ? ?Catherine [name] has agreed to follow-up with our clinic in 3-4 weeks. She was informed of the importance of frequent follow-up visits to maximize her success with intensive lifestyle modifications for her multiple health conditions.  ? ?Objective:  ? ?Blood pressure 119/84, pulse 93, temperature 98.2 ?F (36.8 ?C), height 5\' 7"  (1.702 m), weight (!) 433 lb (196.4 kg), SpO2  99 %. ?Body mass index is 67.82 kg/m?. ? ?General: Cooperative, alert, well developed, in no acute distress. ?HEENT: Conjunctivae and lids unremarkable. ?Cardiovascular: Regular rhythm.  ?Lungs: Normal work of breathing. ?Neurologic: No focal deficits.  ? ?Lab Results  ?Component Value Date  ? CREATININE 0.91 06/20/2021  ? BUN 16  06/20/2021  ? NA 140 06/20/2021  ? K 4.5 06/20/2021  ? CL 100 06/20/2021  ? CO2 24 06/20/2021  ? ?Lab Results  ?Component Value Date  ? ALT 25 06/20/2021  ? AST 24 06/20/2021  ? ALKPHOS 61 06/20/2021  ? BILITOT 0.6 06/20/2021  ? ?Lab Results  ?Component Value Date  ? HGBA1C 5.3 06/20/2021  ? HGBA1C 5.4 09/11/2020  ? HGBA1C 5.4 03/06/2020  ? HGBA1C 5.5 11/22/2019  ? HGBA1C 5.4 07/26/2019  ? ?Lab Results  ?Component Value Date  ? INSULIN 29.4 (H) 06/20/2021  ? INSULIN 43.4 (H) 09/11/2020  ? INSULIN 39.2 (H) 03/06/2020  ? INSULIN 32.5 (H) 11/22/2019  ? INSULIN 41.7 (H) 07/26/2019  ? ?Lab Results  ?Component Value Date  ? TSH 2.160 07/26/2019  ? ?Lab Results  ?Component Value Date  ? CHOL 146 07/26/2019  ? HDL 46 07/26/2019  ? LDLCALC 90 07/26/2019  ? TRIG 45 07/26/2019  ? CHOLHDL 3.6 11/21/2015  ? ?Lab Results  ?Component Value Date  ? VD25OH 38.6 06/20/2021  ? VD25OH 47.6 09/11/2020  ? VD25OH 42.3 03/06/2020  ? ?Lab Results  ?Component Value Date  ? WBC 11.1 (H) 07/26/2019  ? HGB 12.9 07/26/2019  ? HCT 38.1 07/26/2019  ? MCV 88 07/26/2019  ? PLT 219 07/26/2019  ? ?Lab Results  ?Component Value Date  ? IRON 25 (L) 05/18/2017  ? TIBC 363 05/18/2017  ? FERRITIN 16 05/18/2017  ? ?Attestation Statements:  ? ?Reviewed by clinician on day of visit: allergies, medications, problem list, medical history, surgical history, family history, social history, and previous encounter notes. ? ?I, Jackson Latino, RMA, am acting as transcriptionist for Reuben Likes, MD ? ?I have reviewed the above documentation for accuracy and completeness, and I agree with the above. Reuben Likes, MD ? ?

## 2021-12-23 ENCOUNTER — Ambulatory Visit (INDEPENDENT_AMBULATORY_CARE_PROVIDER_SITE_OTHER): Payer: PRIVATE HEALTH INSURANCE | Admitting: Family Medicine

## 2021-12-23 ENCOUNTER — Other Ambulatory Visit (HOSPITAL_COMMUNITY)
Admission: RE | Admit: 2021-12-23 | Discharge: 2021-12-23 | Disposition: A | Discharge status: Home / Self Care | Payer: No Typology Code available for payment source | Source: Ambulatory Visit | Attending: Family Medicine | Admitting: Family Medicine

## 2021-12-23 ENCOUNTER — Other Ambulatory Visit: Payer: Self-pay

## 2021-12-23 ENCOUNTER — Encounter: Payer: Self-pay | Admitting: Family Medicine

## 2021-12-23 VITALS — BP 128/81 | HR 104 | Ht 67.0 in | Wt >= 6400 oz

## 2021-12-23 DIAGNOSIS — Z01419 Encounter for gynecological examination (general) (routine) without abnormal findings: Secondary | ICD-10-CM | POA: Insufficient documentation

## 2021-12-23 NOTE — Progress Notes (Unsigned)
° ° °  SUBJECTIVE:   Chief compliant/HPI: annual examination  Catherine [name] is a 41 y.o. who presents today for an annual exam.   History tabs reviewed and updated ***.   Review of systems form reviewed and notable for ***.   OBJECTIVE:   There were no vitals taken for this visit.  ***  ASSESSMENT/PLAN:   No problem-specific Assessment & Plan notes found for this encounter.    Annual Examination  See AVS for age appropriate recommendations.   PHQ score ***, reviewed and discussed.  Blood pressure reviewed and at goal ***.  Asked about intimate partner violence and resources given as appropriate  The patient currently uses *** for contraception. Folate recommended as appropriate, minimum of 400 mcg per day.   Considered the following items based upon USPSTF recommendations: Diabetes screening: {discussed/ordered:14545} Screening for elevated cholesterol: {discussed/ordered:14545} HIV testing: discussed and ordered Hepatitis C: discussed and ordered Hepatitis B: {discussed/ordered:14545} Syphilis if at high risk: {discussed/ordered:14545} GC/CT {GC/CT screening :23818} Reviewed risk factors for latent tuberculosis and {not indicated/requested/declined:14582} Reviewed risk factors for osteoporosis. Using FRAX tool estimated risk of major osteoporotic fracture of  ***%, early screening {ordered not order:23822::"not ordered"}   Discussed family history, BRCA testing {not indicated/requested/declined:14582}. Tool used to risk stratify was ***.  Cervical cancer screening: due for Pap today, cytology + HPV ordered Breast cancer screening: {mammoscreen:23820} Colorectal cancer screening: {crcscreen:23821::"discussed, colonoscopy ordered"} if age 19 or over.   Follow up in 1 *** year or sooner if indicated.    Gerlene Fee, [place]

## 2021-12-23 NOTE — Patient Instructions (Signed)

## 2021-12-24 ENCOUNTER — Encounter: Payer: Self-pay | Admitting: Family Medicine

## 2021-12-24 ENCOUNTER — Telehealth: Payer: Self-pay | Admitting: Family Medicine

## 2021-12-24 DIAGNOSIS — Z803 Family history of malignant neoplasm of breast: Secondary | ICD-10-CM

## 2021-12-24 LAB — HIV ANTIBODY (ROUTINE TESTING W REFLEX): HIV Screen 4th Generation wRfx: NONREACTIVE

## 2021-12-24 LAB — HCV INTERPRETATION

## 2021-12-24 LAB — HCV AB W REFLEX TO QUANT PCR: HCV Ab: NONREACTIVE

## 2021-12-24 NOTE — Telephone Encounter (Signed)
Spoke with patient about breast cancer risk assessment tool findings as documented in 3/13 note. Would like to test for BRCA 1/2. Future order placed. Notified of negative HIV, HepC. Plans to follow up for PCOS. Consider lipid panel at follow up. ?

## 2021-12-26 ENCOUNTER — Telehealth: Payer: Self-pay | Admitting: Family Medicine

## 2021-12-26 ENCOUNTER — Telehealth: Payer: Self-pay | Admitting: Genetic Counselor

## 2021-12-26 NOTE — Telephone Encounter (Signed)
Called the patient back and informed that she also has a family history of pancreatic cancer. Referral placed for cancer genetics testing. Patient plans to make follow up for PCOS.  ? ?Lavonda Jumbo, DO ?12/26/2021, 12:04 PM ?PGY-3, [name]' ?

## 2021-12-26 NOTE — Addendum Note (Signed)
Addended by: Alonna Buckler on: 12/26/2021 12:04 PM ? ? Modules accepted: Orders ? ?

## 2021-12-26 NOTE — Telephone Encounter (Signed)
Patient returns call to nurse line.  ? ?Patient would like to move forward with referral. Patient is wanting to know if she should FU with PCP to discuss PCOS.  ? ?Will forward to provider who called patient.  ?

## 2021-12-26 NOTE — Telephone Encounter (Signed)
Scheduled appt per 3/16 referral. Pt is aware of appt date and time. Pt is aware to arrive 15 mins prior to appt time and to bring and updated insurance card. Pt is aware of appt location.   ?

## 2021-12-26 NOTE — Telephone Encounter (Signed)
Called patient to discuss the possible of referral to cancer genetics testing for BRCA1/2 testing. LVM. Encouraged to call back to discuss or can discuss at follow up.  ? ?Gerlene Fee, DO ?12/26/2021, 10:57 AM ?PGY-3, [place] ? ?

## 2021-12-27 LAB — CYTOLOGY - PAP
Comment: NEGATIVE
Diagnosis: NEGATIVE
High risk HPV: NEGATIVE

## 2022-01-02 ENCOUNTER — Ambulatory Visit: Payer: No Typology Code available for payment source | Admitting: Family Medicine

## 2022-01-08 ENCOUNTER — Ambulatory Visit (INDEPENDENT_AMBULATORY_CARE_PROVIDER_SITE_OTHER): Payer: PRIVATE HEALTH INSURANCE | Admitting: Family Medicine

## 2022-01-08 ENCOUNTER — Encounter: Payer: Self-pay | Admitting: Family Medicine

## 2022-01-08 VITALS — BP 118/87 | HR 76 | Temp 98.1°F | Ht 67.0 in | Wt >= 6400 oz

## 2022-01-08 DIAGNOSIS — B349 Viral infection, unspecified: Secondary | ICD-10-CM | POA: Diagnosis not present

## 2022-01-08 DIAGNOSIS — L680 Hirsutism: Secondary | ICD-10-CM

## 2022-01-08 DIAGNOSIS — Z842 Family history of other diseases of the genitourinary system: Secondary | ICD-10-CM

## 2022-01-08 NOTE — Progress Notes (Deleted)
? ? ?  SUBJECTIVE:  ? ?CHIEF COMPLAINT / HPI:  ? ?*** ? ?PERTINENT  PMH / PSH: *** ? ?OBJECTIVE:  ? ?There were no vitals taken for this visit.  ?*** ? ?ASSESSMENT/PLAN:  ? ?No problem-specific Assessment & Plan notes found for this encounter. ?  ?Rotterdam criteria (preferred) -- Most expert groups use Rotterdam criteria to make the diagnosis of PCOS [19,23]. ?Two out of three of the following criteria are required to make the diagnosis [24]: ??Oligo- and/or anovulation ??Clinical and/or biochemical signs of hyperandrogenism ??Polycystic ovaries (by ultrasound) ? ?Bryona Foxworthy Autry-Lott, DO ?[facility]  ?

## 2022-01-08 NOTE — Patient Instructions (Addendum)
It was wonderful to see you today. ? ?Please bring ALL of your medications with you to every visit.  ? ?Today we talked about: ? ?Concern for PCOS. We will get morning labs. If not already tried would ask about mounjaro.  ?Viral illness- we discussed supportive care and follow up in 1 week if worse or no improvement.  ? ?Please be sure to schedule follow up at the front  desk before you leave today.  ? ?If you haven't already, sign up for My Chart to have easy access to your labs results, and communication with your primary care physician. ? ?Please call the clinic at [phone] if your symptoms worsen or you have any concerns. It was our pleasure to serve you. ? ?Dr. Salvadore Dom ? ?

## 2022-01-10 NOTE — Progress Notes (Signed)
? ? ?  SUBJECTIVE:  ? ?CHIEF COMPLAINT / HPI:  ? ?Concern for PCOS ?Would like to be tested for PCOS because she has facial and chest hair growth and history of irregular periods with a family hx of PCOS. She is mostly concerned with inability to continue to lose weight. She is part of [facility] but has stalled on weight loss journey. She has tried different injectables without much success lately. She was recently switched to wegovy but has tried phentermine, ozempic, and saxenda. She has not tired mounjaro. She was told bariatrics was not an option. ? ?Throat pain ?3 days of throat pain, fever, cough, and a hoarse voice. She states her son was sick 1 week prior and her daughter works at a daycare. The has tired tylenol, thera flu, and dayquil without relief. She states she always seem to get ear infections with these kind of illnesses but denies ear pain. ? ?PERTINENT  PMH / PSH: BMI 69 ? ?OBJECTIVE:  ? ?BP 118/87   Pulse 76   Temp 98.1 ?F (36.7 ?C)   Ht 5\' 7"  (1.702 m)   Wt (!) 441 lb (200 kg)   SpO2 100%   BMI 69.07 kg/m?   ?Physical Exam ?Vitals reviewed.  ?Constitutional:   ?   General: She is not in acute distress. ?   Appearance: She is ill-appearing. She is not toxic-appearing or diaphoretic.  ?HENT:  ?   Right Ear: Tympanic membrane normal.  ?   Left Ear: Tympanic membrane normal.  ?   Nose: Congestion present.  ?   Mouth/Throat:  ?   Mouth: No oral lesions.  ?   Pharynx: Posterior oropharyngeal erythema present. No pharyngeal swelling or oropharyngeal exudate.  ?Cardiovascular:  ?   Rate and Rhythm: Normal rate and regular rhythm.  ?Pulmonary:  ?   Effort: Pulmonary effort is normal.  ?   Breath sounds: Normal breath sounds.  ?Musculoskeletal:  ?   Cervical back: Neck supple.  ?Neurological:  ?   Mental Status: She is alert and oriented to person, place, and time.  ?Psychiatric:     ?   Mood and Affect: Mood normal.     ?   Behavior: Behavior normal.  ? ?ASSESSMENT/PLAN:   ? ?Hirsutism  Family history of PCOS ?Meets 2 out of 3 R[place]terdam criteria as cited below. Will obtain labs to rule out other possible conditions. Future order placed for timing of AM testosterone. Patient made aware and plans to return in the morning for AM labs. Can consider mounjaro for weightloss. Discussed with Dr. may help with weight loss that has hit a plateau. Patient encouraged to communicate this at her next OV at [facility] if desired.  ? ?Rotterdam criteria (preferred) -- Most expert groups use Rotterdam criteria to make the diagnosis of PCOS. ?Two out of three of the following criteria are required to make the diagnosis: ??Oligo- and/or anovulation ??Clinical and/or biochemical signs of hyperandrogenism ??Polycystic ovaries (by ultrasound) ? ?- Testosterone; Future ?- Prolactin; Future ?- FSH/LH; Future ?- DHEA-sulfate; Future ?- 17-Hydroxyprogesterone; Future ?- TSH; Future ? ?Viral illness ?3 days of illness. Known sick contact. Throat is erythematous and has some nasal congestion. TMs unremarkable. Encouraged to follow up if symptoms do not improve. Discussed supportive care. Declined respiratory testing. Would not test for flu due to out of window for treatment.  ? ?[facility], DO ?[facility]  ?

## 2022-01-13 ENCOUNTER — Other Ambulatory Visit: Payer: Self-pay | Admitting: Family Medicine

## 2022-01-13 ENCOUNTER — Other Ambulatory Visit: Payer: No Typology Code available for payment source

## 2022-01-13 ENCOUNTER — Ambulatory Visit (INDEPENDENT_AMBULATORY_CARE_PROVIDER_SITE_OTHER): Payer: PRIVATE HEALTH INSURANCE | Admitting: Family Medicine

## 2022-01-13 ENCOUNTER — Encounter (INDEPENDENT_AMBULATORY_CARE_PROVIDER_SITE_OTHER): Payer: Self-pay | Admitting: Family Medicine

## 2022-01-13 VITALS — BP 122/78 | HR 81 | Temp 97.8°F | Ht 67.0 in | Wt >= 6400 oz

## 2022-01-13 DIAGNOSIS — L680 Hirsutism: Secondary | ICD-10-CM

## 2022-01-13 DIAGNOSIS — E8881 Metabolic syndrome: Secondary | ICD-10-CM | POA: Diagnosis not present

## 2022-01-13 DIAGNOSIS — E669 Obesity, unspecified: Secondary | ICD-10-CM

## 2022-01-13 DIAGNOSIS — R632 Polyphagia: Secondary | ICD-10-CM

## 2022-01-13 DIAGNOSIS — E559 Vitamin D deficiency, unspecified: Secondary | ICD-10-CM

## 2022-01-13 DIAGNOSIS — Z6841 Body Mass Index (BMI) 40.0 and over, adult: Secondary | ICD-10-CM

## 2022-01-13 DIAGNOSIS — Z842 Family history of other diseases of the genitourinary system: Secondary | ICD-10-CM

## 2022-01-13 MED ORDER — TOPIRAMATE 25 MG PO TABS: 25.0000 mg | ORAL_TABLET | Freq: Every day | ORAL | 0 refills | Status: DC

## 2022-01-13 MED ORDER — WEGOVY 0.25 MG/0.5ML ~~LOC~~ SOAJ: 0.2500 mg | SUBCUTANEOUS | 0 refills | Status: DC

## 2022-01-13 NOTE — Addendum Note (Signed)
Addended by: Alonna Buckler on: 01/13/2022 08:59 AM ? ? Modules accepted: Orders ? ?

## 2022-01-14 ENCOUNTER — Other Ambulatory Visit (INDEPENDENT_AMBULATORY_CARE_PROVIDER_SITE_OTHER): Payer: Self-pay | Admitting: Family Medicine

## 2022-01-14 DIAGNOSIS — R632 Polyphagia: Secondary | ICD-10-CM

## 2022-01-14 LAB — HEMOGLOBIN A1C
Est. average glucose Bld gHb Est-mCnc: 111 mg/dL
Hgb A1c MFr Bld: 5.5 % (ref 4.8–5.6)

## 2022-01-14 LAB — INSULIN, RANDOM: INSULIN: 39.8 u[IU]/mL — ABNORMAL HIGH (ref 2.6–24.9)

## 2022-01-14 LAB — VITAMIN D 25 HYDROXY (VIT D DEFICIENCY, FRACTURES): Vit D, 25-Hydroxy: 49.8 ng/mL (ref 30.0–100.0)

## 2022-01-15 ENCOUNTER — Encounter: Payer: Self-pay | Admitting: Family Medicine

## 2022-01-15 LAB — TSH: TSH: 2.67 u[IU]/mL (ref 0.450–4.500)

## 2022-01-15 LAB — PROLACTIN: Prolactin: 9.6 ng/mL (ref 4.8–23.3)

## 2022-01-15 LAB — DHEA-SULFATE: DHEA-SO4: 42.7 ug/dL — ABNORMAL LOW (ref 57.3–279.2)

## 2022-01-15 LAB — TESTOSTERONE: Testosterone: 15 ng/dL (ref 8–60)

## 2022-01-15 LAB — FSH/LH
FSH: 2.8 m[IU]/mL
LH: 2.4 m[IU]/mL

## 2022-01-15 LAB — 17-HYDROXYPROGESTERONE: 17-OH Progesterone LCMS: 47 ng/dL

## 2022-01-16 NOTE — Progress Notes (Signed)
? ? ? ?Chief Complaint:  ? ?OBESITY ?Catherine [name] is here to discuss her progress with her obesity treatment plan along with follow-up of her obesity related diagnoses. Catherine [name] is on the [place] + 600 calories and states she is following her eating plan approximately 85% of the time. Catherine [name] states she is walking for 30 minutes 2 times per week. ? ?Today's visit was #: 91 ?Starting weight: 468 lbs ?Starting date: 10/27/2016 ?Today's weight: 430 lbs ?Today's date: 01/13/2022 ?Total lbs lost to date: 45 ?Total lbs lost since last in-office visit: 3 ? ?Interim History: Catherine [name] is trying to stay on meal plan more specifically. She got some walking in. She is getting labs done for PCOS this morning. She has been able to get all of her food in even when she wasn't feeling well last week. Last few weeks she feels the hardest part was getting all activity in.  ? ?Subjective:  ? ?1. Vitamin D deficiency ?Patrici is on prescription Vitamin D. She denies nausea, vomiting, or muscle weakness, but notes fatigue.  ? ?2. Insulin resistance ?Catherine [name] last A1c was 5.3 and insulin 29.4. She is on a GLP-1. ? ?3. Polyphagia ?Catherine [name] is doing well on topiramate. No mental cloudiness/difficulty concentrating.  ? ?Assessment/Plan:  ? ?1. Vitamin D deficiency ?Vitamin D level added today. We will hold off on refilling Vit D until labs results are back.  ? ?- VITAMIN D 25 Hydroxy (Vit-D Deficiency, Fractures) ? ?2. Insulin resistance ?We will check A1c and insulin today, and we will discussed at [facility] next appointment.  ? ?- Hemoglobin A1c ?- Insulin, random ? ?3. Polyphagia ?We will refill topiramate 25 mg PO daily for 1 month. ? ?- topiramate (TOPAMAX) 25 MG tablet; Take 1 tablet (25 mg total) by mouth daily.  Dispense: 30 tablet; Refill: 0 ? ?4. Obesity with current BMI of 67.4 ?Catherine [name] is currently in the action stage of change. As such, her goal is to continue with weight loss efforts. She has agreed to the Category 4 Plan.   ? ?We will repeat IC at her next appointment.  ? ?We discussed various medication options to help Catherine [name] with her weight loss efforts and we both agreed to continue Wegovy at 0.25 mg SubQ weekly, and we will refill for 1 month; no change in dose, she is still getting all medications in. ? ?- Semaglutide-Weight Management (WEGOVY) 0.25 MG/0.5ML SOAJ; Inject 0.25 mg into the skin once a week.  Dispense: 2 mL; Refill: 0 ? ?Exercise goals: All adults should avoid inactivity. Some physical activity is better than none, and adults who participate in any amount of physical activity gain some health benefits. ? ?Behavioral modification strategies: increasing lean protein intake, meal planning and cooking strategies, keeping healthy foods in the home, and planning for success. ? ?Catherine [name] has agreed to follow-up with our clinic in 3 weeks. She was informed of the importance of frequent follow-up visits to maximize her success with intensive lifestyle modifications for her multiple health conditions.  ? ?Objective:  ? ?Blood pressure 122/78, pulse 81, temperature 97.8 ?F (36.6 ?C), height 5\' 7"  (1.702 m), weight (!) 430 lb (195 kg), SpO2 100 %. ?Body mass index is 67.35 kg/m?. ? ?General: Cooperative, alert, well developed, in no acute distress. ?HEENT: Conjunctivae and lids unremarkable. ?Cardiovascular: Regular rhythm.  ?Lungs: Normal work of breathing. ?Neurologic: No focal deficits.  ? ?Lab Results  ?Component Value Date  ? CREATININE 0.91 06/20/2021  ? BUN 16 06/20/2021  ? NA 140 06/20/2021  ?  K 4.5 06/20/2021  ? CL 100 06/20/2021  ? CO2 24 06/20/2021  ? ?Lab Results  ?Component Value Date  ? ALT 25 06/20/2021  ? AST 24 06/20/2021  ? ALKPHOS 61 06/20/2021  ? BILITOT 0.6 06/20/2021  ? ?Lab Results  ?Component Value Date  ? HGBA1C 5.5 01/13/2022  ? HGBA1C 5.3 06/20/2021  ? HGBA1C 5.4 09/11/2020  ? HGBA1C 5.4 03/06/2020  ? HGBA1C 5.5 11/22/2019  ? ?Lab Results  ?Component Value Date  ? INSULIN 39.8 (H) 01/13/2022  ?  INSULIN 29.4 (H) 06/20/2021  ? INSULIN 43.4 (H) 09/11/2020  ? INSULIN 39.2 (H) 03/06/2020  ? INSULIN 32.5 (H) 11/22/2019  ? ?Lab Results  ?Component Value Date  ? TSH 2.670 01/13/2022  ? ?Lab Results  ?Component Value Date  ? CHOL 146 07/26/2019  ? HDL 46 07/26/2019  ? Bruin 90 07/26/2019  ? TRIG 45 07/26/2019  ? CHOLHDL 3.6 11/21/2015  ? ?Lab Results  ?Component Value Date  ? VD25OH 49.8 01/13/2022  ? VD25OH 38.6 06/20/2021  ? VD25OH 47.6 09/11/2020  ? ?Lab Results  ?Component Value Date  ? WBC 11.1 (H) 07/26/2019  ? HGB 12.9 07/26/2019  ? HCT 38.1 07/26/2019  ? MCV 88 07/26/2019  ? PLT 219 07/26/2019  ? ?Lab Results  ?Component Value Date  ? IRON 25 (L) 05/18/2017  ? TIBC 363 05/18/2017  ? FERRITIN 16 05/18/2017  ? ?Attestation Statements:  ? ?Reviewed by clinician on day of visit: allergies, medications, problem list, medical history, surgical history, family history, social history, and previous encounter notes. ? ? ?I, Trixie Dredge, am acting as transcriptionist for Coralie Common, MD. ? ?I have reviewed the above documentation for accuracy and completeness, and I agree with the above. Coralie Common, MD ? ? ?

## 2022-01-23 ENCOUNTER — Ambulatory Visit
Admission: RE | Admit: 2022-01-23 | Discharge: 2022-01-23 | Disposition: A | Discharge status: Home / Self Care | Payer: PRIVATE HEALTH INSURANCE | Source: Ambulatory Visit | Attending: Family Medicine | Admitting: Family Medicine

## 2022-01-23 DIAGNOSIS — Z01419 Encounter for gynecological examination (general) (routine) without abnormal findings: Secondary | ICD-10-CM

## 2022-01-27 ENCOUNTER — Other Ambulatory Visit: Payer: Self-pay | Admitting: Family Medicine

## 2022-01-27 DIAGNOSIS — R928 Other abnormal and inconclusive findings on diagnostic imaging of breast: Secondary | ICD-10-CM

## 2022-01-29 ENCOUNTER — Telehealth: Payer: Self-pay | Admitting: *Deleted

## 2022-01-29 DIAGNOSIS — N6489 Other specified disorders of breast: Secondary | ICD-10-CM

## 2022-01-29 NOTE — Telephone Encounter (Signed)
Patient called stating that her insurance is needing a copy of the breast ultrasound order faxed to them.  They need to be the ones who schedule the appointment in order for it to be paid for.  Patient gave verbal consent to fax this to [employer] at [phone].  We can call their customer service at [phone].  Catherine [name],CMA ? ? ?*order placed in Dr. Theora Gianotti box for signature and then to be faxed.  ?

## 2022-01-30 NOTE — Telephone Encounter (Signed)
Signed and placed in to fax pile.  Joandy Burget, MD  Family Medicine Teaching Service   

## 2022-02-02 ENCOUNTER — Other Ambulatory Visit (INDEPENDENT_AMBULATORY_CARE_PROVIDER_SITE_OTHER): Payer: Self-pay | Admitting: Family Medicine

## 2022-02-02 DIAGNOSIS — F3289 Other specified depressive episodes: Secondary | ICD-10-CM

## 2022-02-03 ENCOUNTER — Ambulatory Visit (INDEPENDENT_AMBULATORY_CARE_PROVIDER_SITE_OTHER): Payer: PRIVATE HEALTH INSURANCE | Admitting: Family Medicine

## 2022-02-03 ENCOUNTER — Encounter (INDEPENDENT_AMBULATORY_CARE_PROVIDER_SITE_OTHER): Payer: Self-pay | Admitting: Family Medicine

## 2022-02-03 VITALS — BP 113/77 | HR 97 | Temp 98.1°F | Ht 67.0 in | Wt >= 6400 oz

## 2022-02-03 DIAGNOSIS — R632 Polyphagia: Secondary | ICD-10-CM | POA: Diagnosis not present

## 2022-02-03 DIAGNOSIS — F3289 Other specified depressive episodes: Secondary | ICD-10-CM

## 2022-02-03 DIAGNOSIS — Z9189 Other specified personal risk factors, not elsewhere classified: Secondary | ICD-10-CM

## 2022-02-03 DIAGNOSIS — Z6841 Body Mass Index (BMI) 40.0 and over, adult: Secondary | ICD-10-CM

## 2022-02-03 DIAGNOSIS — E669 Obesity, unspecified: Secondary | ICD-10-CM

## 2022-02-03 DIAGNOSIS — E559 Vitamin D deficiency, unspecified: Secondary | ICD-10-CM | POA: Diagnosis not present

## 2022-02-03 MED ORDER — VITAMIN D (ERGOCALCIFEROL) 1.25 MG (50000 UNIT) PO CAPS: 50000.0000 [IU] | ORAL_CAPSULE | ORAL | 0 refills | Status: DC

## 2022-02-03 MED ORDER — BUPROPION HCL ER (SR) 150 MG PO TB12: 150.0000 mg | ORAL_TABLET | Freq: Two times a day (BID) | ORAL | 0 refills | Status: DC

## 2022-02-03 MED ORDER — TOPIRAMATE 25 MG PO TABS: 25.0000 mg | ORAL_TABLET | Freq: Every day | ORAL | 0 refills | Status: DC

## 2022-02-03 MED ORDER — WEGOVY 0.25 MG/0.5ML ~~LOC~~ SOAJ: 0.2500 mg | SUBCUTANEOUS | 0 refills | Status: DC

## 2022-02-04 ENCOUNTER — Encounter: Payer: Self-pay | Admitting: Genetic Counselor

## 2022-02-04 NOTE — Telephone Encounter (Signed)
Received a call back from [name] regarding patient's breast ultrasound.  She will also need an order for diagnostic mammogram of the right breast to have prior to the ultrasound. Will forward to MD to place this order.  It will need to be signed by the preceptor and faxed number below in previous message.  ? ? ?Thanks Terrianne Cavness,CMA ? ?

## 2022-02-05 ENCOUNTER — Encounter: Payer: Self-pay | Admitting: Family Medicine

## 2022-02-05 ENCOUNTER — Ambulatory Visit (INDEPENDENT_AMBULATORY_CARE_PROVIDER_SITE_OTHER): Payer: PRIVATE HEALTH INSURANCE | Admitting: Family Medicine

## 2022-02-05 VITALS — BP 132/98 | HR 84 | Wt >= 6400 oz

## 2022-02-05 DIAGNOSIS — E282 Polycystic ovarian syndrome: Secondary | ICD-10-CM | POA: Diagnosis not present

## 2022-02-05 DIAGNOSIS — R928 Other abnormal and inconclusive findings on diagnostic imaging of breast: Secondary | ICD-10-CM | POA: Diagnosis not present

## 2022-02-05 DIAGNOSIS — R030 Elevated blood-pressure reading, without diagnosis of hypertension: Secondary | ICD-10-CM

## 2022-02-05 MED ORDER — METFORMIN HCL ER 500 MG PO TB24: 500.0000 mg | ORAL_TABLET | Freq: Every day | ORAL | 0 refills | Status: DC

## 2022-02-05 NOTE — Progress Notes (Deleted)
? ? ?  SUBJECTIVE:  ? ?CHIEF COMPLAINT / HPI:  ?Discuss PCOS diagnosis ? ?Cynthea is in clinic today to discuss her diagnosis and treatment of PCOS. Her most bothersome symptoms include weight gain and hirsutism. She has not had a period since receiving an endometrial ablation several years ago.  ? ?Her obesity is managed by [facility]. She has completed 3 rounds of 0.25 Wegovy, but states that the provider at [facility] does not want to titrate her up because they worry she will lose her appetite and not be able to adhere to the suggested dietary regimen. ? ?Abnormal mammogram:  ?[facility] mammogram on 01/23/2022  ? ?PERTINENT  PMH / PSH:  ?Morbid obesity, insulin resistance. ?OBJECTIVE:  ? ?BP (!) 132/98   Pulse 84   Wt (!) 439 lb 6.4 oz (199.3 kg)   SpO2 100%   BMI 68.82 kg/m?   ?*** ? ?ASSESSMENT/PLAN:  ? ?No problem-specific Assessment & Plan notes found for this encounter. ?  ? ? ?Mariah Milling, Medical Student ?[facility]  ? ?

## 2022-02-05 NOTE — Patient Instructions (Signed)
It was wonderful to see you today. ? ?Please bring ALL of your medications with you to every visit.  ? ?Today we talked about: ? ?PCOS and weight gain.  We have started metformin 500 mg daily.  Please consider having your Wegovy titrated up.  Follow-up in 1 week for a nurses visit to recheck your blood pressure.  You should be called to schedule your diagnostic mammogram please let us know if you have not received a call by Monday or Tuesday of next week. ? ?Please be sure to schedule follow up at the front  desk before you leave today.  ? ?Please call the clinic at [phone] if your symptoms worsen or you have any concerns. It was our pleasure to serve you. ? ?Dr. Salvadore Dom ? ?

## 2022-02-05 NOTE — Addendum Note (Signed)
Addended byManson Passey, Hassel Uphoff on: 02/05/2022 01:49 PM ? ? Modules accepted: Orders ? ?

## 2022-02-06 ENCOUNTER — Inpatient Hospital Stay: Payer: PRIVATE HEALTH INSURANCE

## 2022-02-06 ENCOUNTER — Other Ambulatory Visit: Payer: Self-pay

## 2022-02-06 ENCOUNTER — Inpatient Hospital Stay: Payer: PRIVATE HEALTH INSURANCE | Attending: Genetic Counselor | Admitting: Genetic Counselor

## 2022-02-06 DIAGNOSIS — Z8042 Family history of malignant neoplasm of prostate: Secondary | ICD-10-CM | POA: Diagnosis not present

## 2022-02-06 DIAGNOSIS — Z803 Family history of malignant neoplasm of breast: Secondary | ICD-10-CM | POA: Diagnosis not present

## 2022-02-06 DIAGNOSIS — Z800 Family history of malignant neoplasm of digestive organs: Secondary | ICD-10-CM

## 2022-02-06 LAB — GENETIC SCREENING ORDER

## 2022-02-06 NOTE — Progress Notes (Signed)
REFERRING PROVIDER: ?Martyn Malay, MD ?[address] ?[place],  [place] [address] ? ?PRIMARY PROVIDER:  ?Eulis Foster, MD ? ?PRIMARY REASON FOR VISIT:  ?Encounter Diagnoses  ?Name Primary?  ? Family history of breast cancer Yes  ? Family history of pancreatic cancer   ? Family history of prostate cancer   ? ? ?HISTORY OF PRESENT ILLNESS:   ?Catherine [name], a 41 y.o. female, was seen for a [facility] cancer genetics consultation at the request of Dr. Owens Shark due to a family history of cancer.  Catherine [name] presents to clinic today to discuss the possibility of a hereditary predisposition to cancer, to discuss genetic testing, and to further clarify her future cancer risks, as well as potential cancer risks for family members.  ? ?Catherine [name] is a 41 y.o. female with no personal history of cancer.   ? ?CANCER HISTORY:  ?Oncology History  ? No history exists.  ? ? ?RISK FACTORS:  ?Menarche was at age 31.  ?First live birth at age 89.  ?OCP use for approximately 10 years.  ?Ovaries intact: yes.  ?Uterus intact: yes.  ?Menopausal status: premenopausal.  ?HRT use: 0 years. ?Colonoscopy: no ?Mammogram within the last year: yes. ?Number of breast biopsies: 0. ?Up to date with pelvic exams: yes. ?Any excessive radiation exposure in the past: no ? ?Past Medical History:  ?Diagnosis Date  ? Allergy   ? Anxiety   ? Arthritis   ? Asthma   ? since baby, seasonal  ? ASTHMA, UNSPECIFIED 12/10/2006  ? Qualifier: Diagnosis of  By: Eusebio Friendly    ? CELLULITIS AND ABSCESS OF LEG EXCEPT FOOT 12/30/2007  ? Qualifier: Diagnosis of  By: Hassell Done FNP, Tori Milks    ? Cyst of bone   ? right side of spine  ? DEPENDENT EDEMA, LEGS, BILATERAL 11/22/2008  ? Qualifier: Diagnosis of  By: Radene Ou MD, [place]    ? Depression 2003Dx  ? Edema   ? Food allergy   ? citris  ? FOOT PAIN, RIGHT 11/22/2008  ? Qualifier: Diagnosis of  By: Radene Ou MD, [place]    ? Gastric ulcer   ? GERD (gastroesophageal reflux disease)   ? Gout   ? Hypertension   ? took  Norvasc for 3 months, doctor discontinued no longer on BP medications  ? Joint pain   ? KNEE INJURY, LEFT 01/01/2009  ? Qualifier: Diagnosis of  By: Amil Amen MD, Benjamine Mola    ? Migraine   ? ONYCHOMYCOSIS, TOENAILS 11/22/2008  ? Qualifier: Diagnosis of  By: Radene Ou MD, [place]    ? Shortness of breath   ? Sleep apnea   ? TINEA PEDIS 11/22/2008  ? Qualifier: Diagnosis of  By: Radene Ou MD, [place]    ? ? ?Past Surgical History:  ?Procedure Laterality Date  ? DILITATION & CURRETTAGE/HYSTROSCOPY WITH NOVASURE ABLATION N/A 07/03/2017  ? Procedure: DILATATION & CURETTAGE/HYSTEROSCOPY WITH NOVASURE ABLATION;  Surgeon: Princess Bruins, MD;  Location: [facility];  Service: Gynecology;  Laterality: N/A;  ? UPPER GI ENDOSCOPY    ? WISDOM TOOTH EXTRACTION  2008  ? x4  ? ? ?Social History  ? ?Socioeconomic History  ? Marital status: Married  ?  Spouse name: Not on file  ? Number of children: 3  ? Years of education: college   ? Highest education level: Not on file  ?Occupational History  ? Occupation: Electrical engineer  ?  Employer: [employer]  ?  Comment: 40+ hours a week  ?Tobacco Use  ? Smoking status:  Former  ?  Packs/day: 0.50  ?  Years: 18.00  ?  Pack years: 9.00  ?  Types: Cigarettes  ?  Start date: 10/13/1996  ?  Quit date: 01/12/2015  ?  Years since quitting: 7.0  ? Smokeless tobacco: Never  ?Vaping Use  ? Vaping Use: Former  ?Substance and Sexual Activity  ? Alcohol use: Yes  ?  Alcohol/week: 1.0 standard drink  ?  Types: 1 Standard drinks or equivalent per week  ?  Comment: rarely drinks  ? Drug use: No  ?  Comment: marjuana - former use, quit 4 years ago  ? Sexual activity: Never  ?  Partners: Female  ?Other Topics Concern  ? Not on file  ?Social History Narrative  ? Not on file  ? ?Social Determinants of Health  ? ?Financial Resource Strain: Not on file  ?Food Insecurity: Not on file  ?Transportation Needs: Not on file  ?Physical Activity: Not on file  ?Stress: Not on file  ?Social Connections: Not on file  ?  ? ?FAMILY HISTORY:   ?We obtained a detailed, 4-generation family history.  Significant diagnoses are listed below: ? ?Catherine [name] was diagnosed with breast cancer at age 42, thyroid cancer in her 40s, and pancreatic cancer at age 36. Her mother was diagnosed with bone cancer at an unknown age, she died at age 1. Her father was diagnosed with prostate cancer at an unknown age and lung cancer at age 35, he smoked and died at age 70. Four paternal uncles had a history of prostate cancer, they are all deceased. One paternal aunt was diagnosed with breast cancer around age 86 and died due to breast cancer metastasis. Catherine [name] believes her [name] who has a history of cancer may have had genetic testing but she is unsure. There is no reported Ashkenazi Jewish ancestry.  ? ? ? ? ? ? ?GENETIC COUNSELING ASSESSMENT: Catherine [name] is a 40 y.o. female with a family history of cancer which is somewhat suggestive of a hereditary predisposition to cancer. We, therefore, discussed and recommended the following at today's visit.  ? ?DISCUSSION: We discussed that 5 - 10% of cancer is hereditary, with most cases of hereditary breast cancer associated with BRCA1/2.  There are other genes that can be associated with hereditary breast cancer syndromes.  We discussed that testing is beneficial for several reasons, including knowing about other cancer risks, identifying potential screening and risk-reduction options that may be appropriate, and to understanding if other family members could be at risk for cancer and allowing them to undergo genetic testing. ? ?We reviewed the characteristics, features and inheritance patterns of hereditary cancer syndromes. We also discussed genetic testing, including the appropriate family members to test, the process of testing, insurance coverage and turn-around-time for results. We discussed the implications of a negative, positive, carrier and/or variant of uncertain significant result. We discussed that  negative results would be uninformative given that Catherine [name] does not have a personal history of cancer. We recommended Catherine [name] pursue genetic testing for a panel that contains genes associated with breast, prostate, thyroid, and pancreatic cancer. ? ?Catherine [name] was offered a common hereditary cancer panel (47 genes) and an expanded pan-cancer panel (77 genes). Catherine [name] was informed of the benefits and limitations of each panel, including that expanded pan-cancer panels contain several genes that do not have clear management guidelines at this point in time.  We also discussed that as the number of genes included on a  panel increases, the chances of variants of uncertain significance increases.  After considering the benefits and limitations of each gene panel, Catherine [name] elected to have [facility] CancerNext-Expanded Panel. ? ?The CancerNext-Expanded gene panel offered by [facility] and includes sequencing, rearrangement, and RNA analysis for the following 77 genes: AIP, ALK, APC, ATM, AXIN2, BAP1, BARD1, BLM, BMPR1A, BRCA1, BRCA2, BRIP1, CDC73, CDH1, CDK4, CDKN1B, CDKN2A, CHEK2, CTNNA1, DICER1, FANCC, FH, FLCN, GALNT12, KIF1B, LZTR1, MAX, MEN1, MET, MLH1, MSH2, MSH3, MSH6, MUTYH, NBN, NF1, NF2, NTHL1, PALB2, PHOX2B, PMS2, POT1, PRKAR1A, PTCH1, PTEN, RAD51C, RAD51D, RB1, RECQL, RET, SDHA, SDHAF2, SDHB, SDHC, SDHD, SMAD4, SMARCA4, SMARCB1, SMARCE1, STK11, SUFU, TMEM127, TP53, TSC1, TSC2, VHL and XRCC2 (sequencing and deletion/duplication); EGFR, EGLN1, HOXB13, KIT, MITF, PDGFRA, POLD1, and POLE (sequencing only); EPCAM and GREM1 (deletion/duplication only).  ? ?Based on Catherine [name]'s family history of cancer, she meets medical criteria for genetic testing. Despite that she meets criteria, she may still have an out of pocket cost. We discussed that if her out of pocket cost for testing is over $100, the laboratory will call and confirm whether she wants to proceed with testing.  If the out of pocket cost of  testing is less than $100 she will be billed by the genetic testing laboratory.  ? ?We discussed that some people do not want to undergo genetic testing due to fear of genetic discrimination.  A federal law cal

## 2022-02-07 ENCOUNTER — Encounter: Payer: Self-pay | Admitting: Genetic Counselor

## 2022-02-07 DIAGNOSIS — R928 Other abnormal and inconclusive findings on diagnostic imaging of breast: Secondary | ICD-10-CM | POA: Insufficient documentation

## 2022-02-07 DIAGNOSIS — Z8042 Family history of malignant neoplasm of prostate: Secondary | ICD-10-CM | POA: Insufficient documentation

## 2022-02-07 DIAGNOSIS — E282 Polycystic ovarian syndrome: Secondary | ICD-10-CM | POA: Insufficient documentation

## 2022-02-07 DIAGNOSIS — Z800 Family history of malignant neoplasm of digestive organs: Secondary | ICD-10-CM | POA: Insufficient documentation

## 2022-02-07 DIAGNOSIS — Z803 Family history of malignant neoplasm of breast: Secondary | ICD-10-CM | POA: Insufficient documentation

## 2022-02-07 NOTE — Progress Notes (Signed)
? ? ?  SUBJECTIVE:  ? ?CHIEF COMPLAINT / HPI:  ?Discuss PCOS diagnosis and weight gain ? ?Catherine [name] is in clinic today to discuss her diagnosis and treatment of PCOS. Her most bothersome symptoms include weight gain and hirsutism. She has not had a period since receiving an endometrial ablation several years ago.  ? ?Her obesity is managed by [facility]. She has completed 3 rounds of 0.25 Wegovy, but states that the provider at [facility] does not want to titrate her up because they worry she will lose her appetite and not be able to adhere to the suggested dietary regimen. ? ?Abnormal mammogram:  ?Catherine [name]'s mammogram on 01/23/2022 suggested possible asymmetry in the right breast, in the process of being scheduled for diagnostic mammogram. Also referred for BRCA testing due to increased risk 2/2 family history.  ? ?PERTINENT  PMH / PSH:  ?Morbid obesity, migraine headaches ?OBJECTIVE:  ? ?BP (!) 132/98   Pulse 84   Wt (!) 439 lb 6.4 oz (199.3 kg)   SpO2 100%   BMI 68.82 kg/m?   ?General: Appears well, no acute distress. Age appropriate. ?Respiratory: normal effort ?Neuro: alert and oriented ?Psych: normal affect ? ?ASSESSMENT/PLAN:  ? ?1. PCOS (polycystic ovarian syndrome) ?She meets 2 out of 3 Rotterdam criteria and prior labs were negative for other causes of anovulation and hirsutism. She desires treatment to help with weight loss journey. She is currently in [facility] program and has seen a plateau and now increased weight gain with wegovy. She cannot received OCPs due to hx of migraine headaches but more importantly fam hx of breast cancer and with recent mammogram results. Today we discussed starting metformin. She has been on this in the past. We will start low and titrate up to tolerability. Suggested patient to ask [facility] for possible switch to [facility] or titrate up on wegovy if current wegovy dose continues to fail to yield results.  ? ?2. Elevated blood pressure reading ?Initial  144/98, repeat 132/98. Usually normotensive. Follow up in 1 week for BP recheck.  ? ?3. Abnormal mammogram ?Diagnostic mammogram ordered by Dr. Owens Shark. Avoid OCPs.  ? ?Gerlene Fee, DO ?[employer]  ? ?

## 2022-02-09 ENCOUNTER — Other Ambulatory Visit (INDEPENDENT_AMBULATORY_CARE_PROVIDER_SITE_OTHER): Payer: Self-pay | Admitting: Family Medicine

## 2022-02-09 DIAGNOSIS — R632 Polyphagia: Secondary | ICD-10-CM

## 2022-02-12 NOTE — Progress Notes (Signed)
Chief Complaint:   OBESITY Catherine [name] is here to discuss her progress with her obesity treatment plan along with follow-up of her obesity related diagnoses. Catherine [name] is on the Category 4 Plan and states she is following her eating plan approximately 85% of the time. Catherine [name] states she is walking 45 minutes 3 times per week.  Today's visit was #: 47 Starting weight: 468 lbs Starting date: 10/27/2016 Today's weight: 436 lbs Today's date: 02/03/2022 Total lbs lost to date: 32 lbs Total lbs lost since last in-office visit: 0  Interim History: Catherine [name] is doing well following Category 4.  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  Catherine [name] denies hunger, her appetite tends to decrease with warmer weather.  She is forcing herself to eat, but does not have any upcoming plans or events.   Subjective:   1. Vitamin D deficiency Catherine [name] is on prescription Vitamin D. Catherine [name]'s last Vitamin D level was 49.8.  2. Polyphagia Catherine [name] is doing better on topiramate, no side effects like mental clouding.  3. Other depression, with emotional eating Catherine [name] is on Wellbutrin with good craving control, no suicidal or homicidal ideations.  4. At risk for deficient intake of food The patient is at a higher than average risk of deficient intake of food due to not eating all her food as prescribed.   Assessment/Plan:   1. Vitamin D deficiency Our plan today is to refill her prescription of Vitamin D.  See below.  - Vitamin D, Ergocalciferol, (DRISDOL) 1.25 MG (50000 UNIT) CAPS capsule; Take 1 capsule (50,000 Units total) by mouth every 7 (seven) days.  Dispense: 4 capsule; Refill: 0  2. Polyphagia Our plan today is to refill topiramate.  See below:  - topiramate (TOPAMAX) 25 MG tablet; Take 1 tablet (25 mg total) by mouth daily.  Dispense: 30 tablet; Refill: 0  3. Other depression, with emotional eating Our plan today is to refill Wellbutrin.  See  below:  - buPROPion (WELLBUTRIN SR) 150 MG 12 hr tablet; Take 1 tablet (150 mg total) by mouth 2 (two) times daily.  Dispense: 60 tablet; Refill: 0  4. At risk for deficient intake of food Catherine [name] was given approximately 15 minutes of deficient intake of food prevention counseling today. Catherine [name] is at risk for eating too few calories based on current food recall. She was encouraged to focus on meeting caloric and protein goals according to her recommended meal plan.   5. Obesity with current BMI of 68.3 - Semaglutide-Weight Management (WEGOVY) 0.25 MG/0.5ML SOAJ; Inject 0.25 mg into the skin once a week.  Dispense: 2 mL; Refill: 0  Catherine [name] is currently in the action stage of change. As such, her goal is to continue with weight loss efforts. She has agreed to the Category 4 Plan.   Exercise goals:  As is.  Behavioral modification strategies: increasing lean protein intake, meal planning and cooking strategies, keeping healthy foods in the home, and planning for success.  Catherine [name] has agreed to follow-up with our clinic in 3 weeks. She was informed of the importance of frequent follow-up visits to maximize her success with intensive lifestyle modifications for her multiple health conditions.   Objective:   Blood pressure 113/77, pulse 97, temperature 98.1 F (36.7 C), height 5\' 7"  (1.702 m), weight (!) 436 lb (197.8 kg), SpO2 100 %. Body mass index is 68.29 kg/m.  General: Cooperative, alert, well developed, in no acute distress. HEENT: Conjunctivae and lids unremarkable.  Cardiovascular: Regular rhythm.  Lungs: Normal work of breathing. Neurologic: No focal deficits.   Lab Results  Component Value Date   CREATININE 0.91 06/20/2021   BUN 16 06/20/2021   NA 140 06/20/2021   K 4.5 06/20/2021   CL 100 06/20/2021   CO2 24 06/20/2021   Lab Results  Component Value Date   ALT 25 06/20/2021   AST 24 06/20/2021   ALKPHOS 61 06/20/2021   BILITOT 0.6 06/20/2021   Lab Results   Component Value Date   HGBA1C 5.5 01/13/2022   HGBA1C 5.3 06/20/2021   HGBA1C 5.4 09/11/2020   HGBA1C 5.4 03/06/2020   HGBA1C 5.5 11/22/2019   Lab Results  Component Value Date   INSULIN 39.8 (H) 01/13/2022   INSULIN 29.4 (H) 06/20/2021   INSULIN 43.4 (H) 09/11/2020   INSULIN 39.2 (H) 03/06/2020   INSULIN 32.5 (H) 11/22/2019   Lab Results  Component Value Date   TSH 2.670 01/13/2022   Lab Results  Component Value Date   CHOL 146 07/26/2019   HDL 46 07/26/2019   LDLCALC 90 07/26/2019   TRIG 45 07/26/2019   CHOLHDL 3.6 11/21/2015   Lab Results  Component Value Date   VD25OH 49.8 01/13/2022   VD25OH 38.6 06/20/2021   VD25OH 47.6 09/11/2020   Lab Results  Component Value Date   WBC 11.1 (H) 07/26/2019   HGB 12.9 07/26/2019   HCT 38.1 07/26/2019   MCV 88 07/26/2019   PLT 219 07/26/2019   Lab Results  Component Value Date   IRON 25 (L) 05/18/2017   TIBC 363 05/18/2017   FERRITIN 16 05/18/2017   Attestation Statements:   Reviewed by clinician on day of visit: allergies, medications, problem list, medical history, surgical history, family history, social history, and previous encounter notes.  I, Davy Pique, am acting as transcriptionist for Coralie Common, MD.  I have reviewed the above documentation for accuracy and completeness, and I agree with the above. - Coralie Common, MD

## 2022-02-13 ENCOUNTER — Other Ambulatory Visit (INDEPENDENT_AMBULATORY_CARE_PROVIDER_SITE_OTHER): Payer: Self-pay | Admitting: Family Medicine

## 2022-02-13 DIAGNOSIS — R632 Polyphagia: Secondary | ICD-10-CM

## 2022-02-14 ENCOUNTER — Other Ambulatory Visit: Payer: Self-pay | Admitting: Family Medicine

## 2022-02-25 NOTE — Progress Notes (Addendum)
    SUBJECTIVE:   CHIEF COMPLAINT / HPI:   Sore on Left Thigh  Catherine [name] is a 41 y.o. female who presents to the [facility] clinic today to discuss sore on the inside of her thigh since March. It is sensitive to touch. She has tried diaper rash cream, neosporin, baby ointment, hydrocortisone cream and nothing is helping. It started out as an open sore- she tried a bed sore cream from [employer] which "closed it".   When she urinates it burns the area. She is constantly having to change the bandage every time she urinates. It is not getting bigger and it has not opened back up. It is red only after showering.   It feels raised when irritated. No fevers.   PERTINENT  PMH / PSH:  Sacral radiculopathy, peroneal neuropathy, obesity, bipolar disorder, depression, lower extremity pain  OBJECTIVE:   BP 128/85   Pulse 88   Wt (!) 433 lb 2 oz (196.5 kg)   SpO2 97%   BMI 67.84 kg/m    General: NAD, pleasant, able to participate in exam Respiratory: Normal respiratory effort Abdomen: Morbidly obese Skin: Approximately 3 cm linear, raised, erythematous lesion to inner left thigh. Right thigh without lesions. Psych: Normal affect and mood     ASSESSMENT/PLAN:   Skin lesion To left inner thigh, present for last 2 months.  On examination it is linear, slightly raised, erythematous.  Differential includes hypertrophic scar, friction abrasion, fungal infection. She has tried several different barrier creams without much improvement.  Has not tried any anti-fungal topicals yet. Patient is morbidly obese with BMI 67, thought possibly friction abrasion but other thigh is clear. Does not look infected at this time.  -Trial Nystatin ointment -Gauze and tape provided to keep area protected -Return if persistent symptoms or worsening -If does not improve, consider skin biopsy     Sharion Settler, [place]

## 2022-02-26 ENCOUNTER — Telehealth: Payer: Self-pay | Admitting: Genetic Counselor

## 2022-02-26 ENCOUNTER — Encounter: Payer: Self-pay | Admitting: Genetic Counselor

## 2022-02-26 DIAGNOSIS — Z1379 Encounter for other screening for genetic and chromosomal anomalies: Secondary | ICD-10-CM | POA: Insufficient documentation

## 2022-02-26 DIAGNOSIS — Z9189 Other specified personal risk factors, not elsewhere classified: Secondary | ICD-10-CM | POA: Insufficient documentation

## 2022-02-26 NOTE — Telephone Encounter (Signed)
I contacted [name] to discuss her genetic testing results. No pathogenic variants were identified in the 77 genes analyzed. Detailed clinic note to follow. ? ?The test report has been scanned into EPIC and is located under the Molecular Pathology section of the Results Review tab.  A portion of the result report is included below for reference.  ? ?Catherine Passy, MS, [place] ?Genetic Counselor ?[email or web address] ?(P) [phone] ? ? ?

## 2022-02-27 ENCOUNTER — Ambulatory Visit (INDEPENDENT_AMBULATORY_CARE_PROVIDER_SITE_OTHER): Payer: PRIVATE HEALTH INSURANCE | Admitting: Family Medicine

## 2022-02-28 ENCOUNTER — Other Ambulatory Visit: Payer: Self-pay | Admitting: Family Medicine

## 2022-02-28 ENCOUNTER — Ambulatory Visit (INDEPENDENT_AMBULATORY_CARE_PROVIDER_SITE_OTHER): Payer: PRIVATE HEALTH INSURANCE | Admitting: Family Medicine

## 2022-02-28 ENCOUNTER — Encounter: Payer: Self-pay | Admitting: Family Medicine

## 2022-02-28 DIAGNOSIS — L989 Disorder of the skin and subcutaneous tissue, unspecified: Secondary | ICD-10-CM | POA: Insufficient documentation

## 2022-02-28 MED ORDER — NYSTATIN 100000 UNIT/GM EX OINT: 1.0000 "application " | TOPICAL_OINTMENT | Freq: Two times a day (BID) | CUTANEOUS | 0 refills | Status: DC

## 2022-02-28 NOTE — Patient Instructions (Signed)
It was wonderful to see you today.  Today we talked about:  -We will trial an antifungal ointment.  Lather it on.  I have given you gauze and tape to also put over the area to protect it from any more irritation. -Return if things do not improve.  Thank you for choosing [facility].   Please call [phone] with any questions about today's appointment.  Please be sure to schedule follow up at the front  desk before you leave today.   Sabino Dick, DO PGY-2 Family Medicine

## 2022-02-28 NOTE — Assessment & Plan Note (Addendum)
To left inner thigh, present for last 2 months.  On examination it is linear, slightly raised, erythematous.  Differential includes hypertrophic scar, friction abrasion, fungal infection. She has tried several different barrier creams without much improvement.  Has not tried any anti-fungal topicals yet. Patient is morbidly obese with BMI 67, thought possibly friction abrasion but other thigh is clear. Does not look infected at this time.  -Trial Nystatin ointment -Gauze and tape provided to keep area protected -Return if persistent symptoms or worsening -If does not improve, consider skin biopsy

## 2022-03-03 ENCOUNTER — Ambulatory Visit (INDEPENDENT_AMBULATORY_CARE_PROVIDER_SITE_OTHER): Payer: PRIVATE HEALTH INSURANCE | Admitting: Family Medicine

## 2022-03-03 ENCOUNTER — Encounter (INDEPENDENT_AMBULATORY_CARE_PROVIDER_SITE_OTHER): Payer: Self-pay | Admitting: Family Medicine

## 2022-03-03 VITALS — BP 110/76 | HR 92 | Temp 97.9°F | Ht 67.0 in | Wt >= 6400 oz

## 2022-03-03 DIAGNOSIS — R632 Polyphagia: Secondary | ICD-10-CM | POA: Diagnosis not present

## 2022-03-03 DIAGNOSIS — R0602 Shortness of breath: Secondary | ICD-10-CM

## 2022-03-03 DIAGNOSIS — E669 Obesity, unspecified: Secondary | ICD-10-CM | POA: Diagnosis not present

## 2022-03-03 DIAGNOSIS — Z9189 Other specified personal risk factors, not elsewhere classified: Secondary | ICD-10-CM

## 2022-03-03 DIAGNOSIS — F3289 Other specified depressive episodes: Secondary | ICD-10-CM | POA: Diagnosis not present

## 2022-03-03 DIAGNOSIS — Z6841 Body Mass Index (BMI) 40.0 and over, adult: Secondary | ICD-10-CM

## 2022-03-03 DIAGNOSIS — E559 Vitamin D deficiency, unspecified: Secondary | ICD-10-CM | POA: Diagnosis not present

## 2022-03-03 DIAGNOSIS — Z7985 Long-term (current) use of injectable non-insulin antidiabetic drugs: Secondary | ICD-10-CM

## 2022-03-03 MED ORDER — WEGOVY 0.5 MG/0.5ML ~~LOC~~ SOAJ
0.5000 mg | SUBCUTANEOUS | 0 refills | Status: DC
  Filled 2022-04-10: qty 2, 28d supply, fill #0

## 2022-03-03 MED ORDER — TOPIRAMATE 25 MG PO TABS: 25.0000 mg | ORAL_TABLET | Freq: Every day | ORAL | 0 refills | Status: DC

## 2022-03-03 MED ORDER — BUPROPION HCL ER (SR) 150 MG PO TB12: 150.0000 mg | ORAL_TABLET | Freq: Two times a day (BID) | ORAL | 0 refills | Status: DC

## 2022-03-03 MED ORDER — VITAMIN D (ERGOCALCIFEROL) 1.25 MG (50000 UNIT) PO CAPS: 50000.0000 [IU] | ORAL_CAPSULE | ORAL | 0 refills | Status: DC

## 2022-03-07 ENCOUNTER — Other Ambulatory Visit: Payer: Self-pay | Admitting: Family Medicine

## 2022-03-07 ENCOUNTER — Ambulatory Visit: Payer: Self-pay | Admitting: Genetic Counselor

## 2022-03-07 DIAGNOSIS — M7061 Trochanteric bursitis, right hip: Secondary | ICD-10-CM

## 2022-03-07 MED ORDER — TIZANIDINE HCL 2 MG PO CAPS: 2.0000 mg | ORAL_CAPSULE | Freq: Every evening | ORAL | 0 refills | Status: DC | PRN

## 2022-03-07 NOTE — Progress Notes (Signed)
HPI:   Ms. Dini was previously seen in the [place] clinic due to a family history of cancer and concerns regarding a hereditary predisposition to cancer. Please refer to our prior cancer genetics clinic note for more information regarding our discussion, assessment and recommendations, at the time. Ms. Wentzel recent genetic test results were disclosed to her, as were recommendations warranted by these results. These results and recommendations are discussed in more detail below.  CANCER HISTORY:  Oncology History   No history exists.    FAMILY HISTORY:  We obtained a detailed, 4-generation family history.  Significant diagnoses are listed below:   Ms. Heldt sister was diagnosed with breast cancer at age 67, thyroid cancer in her 17s, and pancreatic cancer at age 94. Her mother was diagnosed with bone cancer at an unknown age, she died at age 28. Her father was diagnosed with prostate cancer at an unknown age and lung cancer at age 13, he smoked and died at age 41. Four paternal uncles had a history of prostate cancer, they are all deceased. One paternal aunt was diagnosed with breast cancer around age 86 and died due to breast cancer metastasis. Ms. Helm believes her sister who has a history of cancer may have had genetic testing but she is unsure. There is no reported Ashkenazi Jewish ancestry.           GENETIC TEST RESULTS:  The [facility] CancerNext-Expanded Panel found no pathogenic mutations.   The CancerNext-Expanded gene panel offered by [facility] and includes sequencing, rearrangement, and RNA analysis for the following 77 genes: AIP, ALK, APC, ATM, AXIN2, BAP1, BARD1, BLM, BMPR1A, BRCA1, BRCA2, BRIP1, CDC73, CDH1, CDK4, CDKN1B, CDKN2A, CHEK2, CTNNA1, DICER1, FANCC, FH, FLCN, GALNT12, KIF1B, LZTR1, MAX, MEN1, MET, MLH1, MSH2, MSH3, MSH6, MUTYH, NBN, NF1, NF2, NTHL1, PALB2, PHOX2B, PMS2, POT1, PRKAR1A, PTCH1, PTEN, RAD51C, RAD51D, RB1, RECQL, RET, SDHA,  SDHAF2, SDHB, SDHC, SDHD, SMAD4, SMARCA4, SMARCB1, SMARCE1, STK11, SUFU, TMEM127, TP53, TSC1, TSC2, VHL and XRCC2 (sequencing and deletion/duplication); EGFR, EGLN1, HOXB13, KIT, MITF, PDGFRA, POLD1, and POLE (sequencing only); EPCAM and GREM1 (deletion/duplication only).    The test report has been scanned into EPIC and is located under the Molecular Pathology section of the Results Review tab.  A portion of the result report is included below for reference. Genetic testing reported out on 02/22/2022.       Even though a pathogenic variant was not identified, possible explanations for the cancer in the family may include: There may be no hereditary risk for cancer in the family. The cancers in her family may be due to other genetic or environmental factors. There may be a gene mutation in one of these genes that current testing methods cannot detect, but that chance is small. There could be another gene that has not yet been discovered, or that we have not yet tested, that is responsible for the cancer diagnoses in the family.  It is also possible there is a hereditary cause for the cancer in the family that Ms. Lambertson did not inherit.  Therefore, it is important to remain in touch with cancer genetics in the future so that we can continue to offer Ms. Taff the most up to date genetic testing.   ADDITIONAL GENETIC TESTING:  We discussed with Ms. Guevara that her genetic testing was fairly extensive.  If there are genes identified to increase cancer risk that can be analyzed in the future, we would be happy to discuss and coordinate this  testing at that time.    CANCER SCREENING RECOMMENDATIONS:  Ms. Dowse test result is considered negative (normal).  This means that we have not identified a hereditary cause for her family history of cancer at this time.    An individual's cancer risk and medical management are not determined by genetic test results alone. Overall cancer risk assessment  incorporates additional factors, including personal medical history, family history, and any available genetic information that may result in a personalized plan for cancer prevention and surveillance. Therefore, it is recommended she continue to follow the cancer management and screening guidelines provided by her primary healthcare provider.  Based on the reported personal and family history, specific cancer screenings for Ms. DELROSE ROHWER and her family include:  Breast Cancer Screening:  The Tyrer-Cuzick model is one of multiple prediction models developed to estimate an individual's lifetime risk of developing breast cancer. The Tyrer-Cuzick model is endorsed by the [employer]  (NCCN). This model includes many risk factors such as family history, endogenous estrogen exposure, and benign breast disease. The calculation is highly-dependent on the accuracy of clinical data provided by the patient and can change over time. The Tyrer-Cuzick model may be repeated to reflect new information in her personal or family history in the future.   Ms. Keeling Tyrer-Cuzick risk score is 23.2%.  For women with a greater than 20% lifetime risk of breast cancer, the NCCN recommends the following:    1.   Clinical encounter every 6-12 months to begin when identified as being at increased risk, but not before age 69    2.   Annual mammograms, tomosynthesis is recommended starting 10 years earlier than the youngest breast cancer diagnosis in the family or at age 108 (whichever comes first), but not before age 35     3.   Annual breast MRI starting 10 years earlier than the youngest breast cancer diagnosis in the family or at age 95 (whichever comes first), but not before age 47  Therefore, we discussed that it is reasonable for Ms. Kilbride to be followed by a high-risk breast cancer clinic. However, she declined a referral at this time.   RECOMMENDATIONS FOR FAMILY MEMBERS:   Other  members of the family may still carry a pathogenic variant in one of these genes that Ms. Vandergriff did not inherit. Based on the family history, we recommend her sister who has a history of cancer pursue genetic counseling and testing.   FOLLOW-UP:  Cancer genetics is a rapidly advancing field and it is possible that new genetic tests will be appropriate for her and/or her family members in the future. We encouraged her to remain in contact with cancer genetics on an annual basis so we can update her personal and family histories and let her know of advances in cancer genetics that may benefit this family.   Our contact number was provided. Ms. Rahrig questions were answered to her satisfaction, and she knows she is welcome to call us at anytime with additional questions or concerns.   Lucille Passy, MS, [facility] Genetic Counselor [email or web address] (P) [phone]

## 2022-03-10 DIAGNOSIS — Z9189 Other specified personal risk factors, not elsewhere classified: Secondary | ICD-10-CM | POA: Insufficient documentation

## 2022-03-10 DIAGNOSIS — R0602 Shortness of breath: Secondary | ICD-10-CM | POA: Insufficient documentation

## 2022-03-10 NOTE — Progress Notes (Signed)
Chief Complaint:   OBESITY Catherine [name] is here to discuss her progress with her obesity treatment plan along with follow-up of her obesity related diagnoses. Catherine [name] is on the Category 4 Plan and states she is following her eating plan approximately 85-90% of the time. Catherine [name] states she is walking  45 minutes 2-3 times per week.  Today's visit was #: 93 Starting weight: 468 lbs Starting date: 10/27/2016 Today's weight: 428 lbs Today's date: 03/03/2022 Total lbs lost to date: 40 lbs Total lbs lost since last in-office visit: 8 lbs  Interim History: Catherine [name] stress level has improved and she has been able to get more food in more consistently.  Doing quite a bit of boneless chicken breasts and eggs, yogurt and milk.  Has increased vegetable intake as well.   Subjective:   1. SOBOE (shortness of breath on exertion) Catherine [name]'s last IC was 2481 on 05/15/2020. Symptoms have not improved since that appointment.  2. Vitamin D deficiency Catherine [name] denies any nausea, vomiting, or muscle weakness, but complains of fatigue.  Catherine [name] is on prescription vitamin d.  Last vitamin d level 49.8.  3. Polyphagia Catherine [name] is on Topamax with improved control of food intake. Catherine [name] has no side effects of Topamax.  4. Other depression, with emotional eating Catherine [name] is feeling better mental health wise.  No suicidal or homicidal ideations.  She is on Cymbalta and Wellbutrin.  5. At risk for activity intolerance Catherine [name] is at risk of exercise intolerance due to obesity and not always consistent with exercise.  Assessment/Plan:   1. SOBOE (shortness of breath on exertion) IC today, showing RMR of 2117.  2. Vitamin D deficiency Catherine [name] has agreed to prescription Vitamin d.  See below.    - Vitamin D, Ergocalciferol, (DRISDOL) 1.25 MG (50000 UNIT) CAPS capsule; Take 1 capsule (50,000 Units total) by mouth every 7 (seven) days.  Dispense: 4 capsule; Refill: 0  3. Polyphagia Refill Topamax 25 mg  daily #30 daily.  No refills.  See below.   - topiramate (TOPAMAX) 25 MG tablet; Take 1 tablet (25 mg total) by mouth daily.  Dispense: 30 tablet; Refill: 0  4. Other depression, with emotional eating Catherine [name] has agreed to continue Wellbutrin.  Refilled today, see below.   - buPROPion (WELLBUTRIN SR) 150 MG 12 hr tablet; Take 1 tablet (150 mg total) by mouth 2 (two) times daily.  Dispense: 60 tablet; Refill: 0  5. At risk for activity intolerance Catherine [name] was given approximately 15 minutes of exercise intolerance counseling today. She is 41 y.o. female and has risk factors exercise intolerance including obesity. We discussed intensive lifestyle modifications today with an emphasis on specific weight loss instructions and strategies. Catherine [name] will slowly increase activity as tolerated.  Repetitive spaced learning was employed today to elicit superior memory formation and behavioral change.   6. Obesity with current BMI of 67.0 Refill Wegovy and increase to 0.5 mg SQ weekly 2 mL, no refills.   - Semaglutide-Weight Management (WEGOVY) 0.5 MG/0.5ML SOAJ; Inject 0.5 mg into the skin once a week.  Dispense: 2 mL; Refill: 0  Catherine [name] is currently in the action stage of change. As such, her goal is to continue with weight loss efforts. She has agreed to the Category 4 Plan.   Exercise goals: All adults should avoid inactivity. Some physical activity is better than none, and adults who participate in any amount of physical activity gain some health benefits.  Behavioral modification strategies: increasing lean protein intake, meal planning and cooking  strategies, and keeping healthy foods in the home.  Catherine [name] has agreed to follow-up with our clinic in 4 weeks. She was informed of the importance of frequent follow-up visits to maximize her success with intensive lifestyle modifications for her multiple health conditions.   Objective:   Blood pressure 110/76, pulse 92, temperature 97.9 F (36.6 C),  height 5\' 7"  (1.702 m), weight (!) 428 lb (194.1 kg), SpO2 99 %. Body mass index is 67.03 kg/m.  General: Cooperative, alert, well developed, in no acute distress. HEENT: Conjunctivae and lids unremarkable. Cardiovascular: Regular rhythm.  Lungs: Normal work of breathing. Neurologic: No focal deficits.   Lab Results  Component Value Date   CREATININE 0.91 06/20/2021   BUN 16 06/20/2021   NA 140 06/20/2021   K 4.5 06/20/2021   CL 100 06/20/2021   CO2 24 06/20/2021   Lab Results  Component Value Date   ALT 25 06/20/2021   AST 24 06/20/2021   ALKPHOS 61 06/20/2021   BILITOT 0.6 06/20/2021   Lab Results  Component Value Date   HGBA1C 5.5 01/13/2022   HGBA1C 5.3 06/20/2021   HGBA1C 5.4 09/11/2020   HGBA1C 5.4 03/06/2020   HGBA1C 5.5 11/22/2019   Lab Results  Component Value Date   INSULIN 39.8 (H) 01/13/2022   INSULIN 29.4 (H) 06/20/2021   INSULIN 43.4 (H) 09/11/2020   INSULIN 39.2 (H) 03/06/2020   INSULIN 32.5 (H) 11/22/2019   Lab Results  Component Value Date   TSH 2.670 01/13/2022   Lab Results  Component Value Date   CHOL 146 07/26/2019   HDL 46 07/26/2019   LDLCALC 90 07/26/2019   TRIG 45 07/26/2019   CHOLHDL 3.6 11/21/2015   Lab Results  Component Value Date   VD25OH 49.8 01/13/2022   VD25OH 38.6 06/20/2021   VD25OH 47.6 09/11/2020   Lab Results  Component Value Date   WBC 11.1 (H) 07/26/2019   HGB 12.9 07/26/2019   HCT 38.1 07/26/2019   MCV 88 07/26/2019   PLT 219 07/26/2019   Lab Results  Component Value Date   IRON 25 (L) 05/18/2017   TIBC 363 05/18/2017   FERRITIN 16 05/18/2017    Attestation Statements:   Reviewed by clinician on day of visit: allergies, medications, problem list, medical history, surgical history, family history, social history, and previous encounter notes.  I, 07/18/2017, RMA, am acting as transcriptionist for Malcolm Metro, MD. I have reviewed the above documentation for accuracy and completeness, and I  agree with the above. - Reuben Likes, MD

## 2022-03-12 ENCOUNTER — Encounter: Payer: Self-pay | Admitting: Genetic Counselor

## 2022-03-12 ENCOUNTER — Encounter: Payer: Self-pay | Admitting: Family Medicine

## 2022-03-18 ENCOUNTER — Encounter: Payer: Self-pay | Admitting: Primary Care

## 2022-03-18 ENCOUNTER — Encounter: Payer: Self-pay | Admitting: *Deleted

## 2022-03-18 ENCOUNTER — Telehealth: Payer: Self-pay | Admitting: Primary Care

## 2022-03-18 ENCOUNTER — Ambulatory Visit (INDEPENDENT_AMBULATORY_CARE_PROVIDER_SITE_OTHER): Payer: PRIVATE HEALTH INSURANCE | Admitting: Primary Care

## 2022-03-18 VITALS — BP 120/80 | HR 90 | Temp 98.2°F | Ht 67.0 in | Wt >= 6400 oz

## 2022-03-18 DIAGNOSIS — G4733 Obstructive sleep apnea (adult) (pediatric): Secondary | ICD-10-CM | POA: Diagnosis not present

## 2022-03-18 DIAGNOSIS — E282 Polycystic ovarian syndrome: Secondary | ICD-10-CM

## 2022-03-18 NOTE — Assessment & Plan Note (Signed)
-  Continue Metformin 500mg daily

## 2022-03-18 NOTE — Assessment & Plan Note (Signed)
-   Encourage weight loss efforts.  Continue Wegovy 0.5 mg once weekly.

## 2022-03-18 NOTE — Progress Notes (Signed)
Reviewed and agree with assessment/plan.   Ebrahim Deremer, MD [facility] 03/18/2022, 5:48 PM Pager:  [phone]  

## 2022-03-18 NOTE — Progress Notes (Signed)
@Patient  ID: Catherine [name], female    DOB: 09-17-1981, 41 y.o.   MRN: [id number]  Chief Complaint  Patient presents with   Consult    She was on CPAP in the past and needs a new sleep study.     Referring provider: Donnal Moat*  HPI: 41 year old female, former smoker quit in 2016 (9-pack-year history).  Past medical history significant for OSA, asthma, migraine headaches, GERD, PCOS, insulin resistance, obesity.  03/18/2022 Patient presents today for overdue follow-up sleep apnea. She has been off CPAP for several weeks. She lost the humidification chamber to her current CAP machine and has been unable to use since then. Since being off CPAP she reports more daytime sleepiness and headaches. She had an episode gasping for air several nights ago. Up until recently she reports moderate compliance with CPAP use, she would wear her mask on average 4-5 nights a week. She believes her pressure setting is either 9 or 11cm h20. She is working on weight loss efforts, she is on [place] and dose recently increased.   Sleep questionnaire Symptoms- Gasping for air, daytime sleepiness and headache  Prior sleep study- Moderate OSA on sleep study 06/05/2013 Bedtime- 7:30-8pm Time to fall asleep- 15-77mins  Nocturnal awakenings- 1-2 times Out of bed/start of day- 6:30am Weight changes- +/- 30 lbs Do you operate heavy machinery- No Do you currently wear CPAP- Yes, 9 or 11cm h20 (recently lost part to CPAP and has been unable to use) Do you current wear oxygen- No Epworth- 16  Allergies  Allergen Reactions   Citrus Swelling    Immunization History  Administered Date(s) Administered   Influenza Whole 07/13/2005   Influenza,inj,Quad PF,6+ Mos 07/13/2013, 07/05/2014, 07/26/2021   Influenza-Unspecified 05/28/2015, 06/25/2016, 07/01/2017, 07/17/2018   PPD Test 04/06/2019   Td 10/13/1998, 10/13/2001   Unspecified SARS-COV-2 Vaccination 09/14/2020, 10/05/2020    Past Medical History:   Diagnosis Date   Allergy    Anxiety    Arthritis    Asthma    since baby, seasonal   ASTHMA, UNSPECIFIED 12/10/2006   Qualifier: Diagnosis of  By: Eusebio Friendly     CELLULITIS AND ABSCESS OF LEG EXCEPT FOOT 12/30/2007   Qualifier: Diagnosis of  By: Hassell Done FNP, [place]     Cyst of bone    right side of spine   DEPENDENT EDEMA, LEGS, BILATERAL 11/22/2008   Qualifier: Diagnosis of  By: Radene Ou MD, [place]     Depression [id number]   Edema    Food allergy    citris   FOOT PAIN, RIGHT 11/22/2008   Qualifier: Diagnosis of  By: Radene Ou MD, [place]     Gastric ulcer    GERD (gastroesophageal reflux disease)    Gout    Hypertension    took Norvasc for 3 months, doctor discontinued no longer on BP medications   Joint pain    KNEE INJURY, LEFT 01/01/2009   Qualifier: Diagnosis of  By: Amil Amen MD, Annalycia Done     Migraine    ONYCHOMYCOSIS, TOENAILS 11/22/2008   Qualifier: Diagnosis of  By: Radene Ou MD, [place]     Shortness of breath    Sleep apnea    TINEA PEDIS 11/22/2008   Qualifier: Diagnosis of  By: Radene Ou MD, [place]      Tobacco History: Social History   Tobacco Use  Smoking Status Former   Packs/day: 0.50   Years: 18.00   Pack years: 9.00   Types: Cigarettes   Start date: 10/13/1996   Quit date: 01/12/2015  Years since quitting: 7.1  Smokeless Tobacco Never   Counseling given: Not Answered   Outpatient Medications Prior to Visit  Medication Sig Dispense Refill   albuterol (VENTOLIN HFA) 108 (90 Base) MCG/ACT inhaler Inhale 2 puffs into the lungs every 6 (six) hours as needed for wheezing or shortness of breath. 8 g 0   aspirin-acetaminophen-caffeine (EXCEDRIN MIGRAINE) 250-250-65 MG per tablet Take 2 tablets by mouth 2 (two) times daily as needed for migraine. For headache     buPROPion (WELLBUTRIN SR) 150 MG 12 hr tablet Take 1 tablet (150 mg total) by mouth 2 (two) times daily. 60 tablet 0   celecoxib (CELEBREX) 200 MG capsule TAKE 1 CAPSULE BY MOUTH EVERY DAY AS  NEEDED 30 capsule 0   cetirizine (ZYRTEC) 10 MG tablet Take 10 mg by mouth daily.     DULoxetine (CYMBALTA) 30 MG capsule TAKE 1 CAPSULE BY MOUTH EVERY DAY 90 capsule 1   furosemide (LASIX) 20 MG tablet TAKE 1 TABLET BY MOUTH TWICE A DAY 180 tablet 2   gabapentin (NEURONTIN) 300 MG capsule TAKE 2 CAPSULES (600 MG TOTAL) BY MOUTH 3 TIMES A DAY 60 capsule 1   metFORMIN (GLUCOPHAGE-XR) 500 MG 24 hr tablet Take 1 tablet (500 mg total) by mouth daily with breakfast. 90 tablet 0   nystatin ointment (MYCOSTATIN) Apply 1 application. topically 2 (two) times daily. 30 g 0   pantoprazole (PROTONIX) 40 MG tablet TAKE 1 TABLET (40 MG TOTAL) BY MOUTH DAILY. TAKE 30 MINUTES PRIOR TO BREAKFAST 90 tablet 1   Semaglutide-Weight Management (WEGOVY) 0.5 MG/0.5ML SOAJ Inject 0.5 mg into the skin once a week. 2 mL 0   tizanidine (ZANAFLEX) 2 MG capsule Take 1-2 capsules (2-4 mg total) by mouth at bedtime as needed for muscle spasms. 20 capsule 0   topiramate (TOPAMAX) 25 MG tablet Take 1 tablet (25 mg total) by mouth daily. 30 tablet 0   Vitamin D, Ergocalciferol, (DRISDOL) 1.25 MG (50000 UNIT) CAPS capsule Take 1 capsule (50,000 Units total) by mouth every 7 (seven) days. 4 capsule 0   predniSONE (STERAPRED UNI-PAK 21 TAB) 10 MG (21) TBPK tablet As directed x 6 days (Patient not taking: Reported on 03/18/2022) 21 tablet 0   No facility-administered medications prior to visit.    Review of Systems  Review of Systems  Constitutional:  Positive for fatigue.  HENT: Negative.    Respiratory: Negative.    Psychiatric/Behavioral:  Positive for sleep disturbance.     Physical Exam  BP 120/80 (BP Location: Left Arm, Cuff Size: Normal)   Pulse 90   Temp 98.2 F (36.8 C) (Oral)   Ht 5\' 7"  (1.702 m)   Wt (!) 428 lb (194.1 kg)   SpO2 98%   BMI 67.03 kg/m  Physical Exam Constitutional:      General: She is not in acute distress.    Appearance: Normal appearance. She is obese. She is not ill-appearing.  HENT:      Head: Normocephalic and atraumatic.     Mouth/Throat:     Mouth: Mucous membranes are moist.     Pharynx: Oropharynx is clear.  Cardiovascular:     Rate and Rhythm: Normal rate and regular rhythm.  Pulmonary:     Effort: Pulmonary effort is normal.     Breath sounds: Normal breath sounds.  Musculoskeletal:     Cervical back: Normal range of motion and neck supple.  Skin:    General: Skin is warm and dry.  Neurological:  General: No focal deficit present.     Mental Status: She is alert and oriented to person, place, and time. Mental status is at baseline.  Psychiatric:        Mood and Affect: Mood normal.        Behavior: Behavior normal.        Thought Content: Thought content normal.        Judgment: Judgment normal.     Lab Results:  CBC    Component Value Date/Time   WBC 11.1 (H) 07/26/2019 0750   WBC 13.3 (H) 07/03/2017 0610   RBC 4.34 07/26/2019 0750   RBC 4.33 07/03/2017 0610   HGB 12.9 07/26/2019 0750   HCT 38.1 07/26/2019 0750   PLT 219 07/26/2019 0750   MCV 88 07/26/2019 0750   MCH 29.7 07/26/2019 0750   MCH 25.9 (L) 07/03/2017 0610   MCHC 33.9 07/26/2019 0750   MCHC 32.4 07/03/2017 0610   RDW 12.9 07/26/2019 0750   LYMPHSABS 4.8 (H) 07/26/2019 0750   MONOABS 0.6 07/03/2017 0610   EOSABS 0.0 07/26/2019 0750   BASOSABS 0.0 07/26/2019 0750    BMET    Component Value Date/Time   NA 140 06/20/2021 0740   K 4.5 06/20/2021 0740   CL 100 06/20/2021 0740   CO2 24 06/20/2021 0740   GLUCOSE 86 06/20/2021 0740   GLUCOSE 83 06/04/2016 1155   BUN 16 06/20/2021 0740   CREATININE 0.91 06/20/2021 0740   CREATININE 0.90 06/04/2016 1155   CALCIUM 9.5 06/20/2021 0740   GFRNONAA 79 09/11/2020 0756   GFRNONAA 84 06/04/2016 1155   GFRAA 91 09/11/2020 0756   GFRAA >89 06/04/2016 1155    BNP No results found for: BNP  ProBNP No results found for: PROBNP  Imaging: No results found.   Assessment & Plan:   OSA (obstructive sleep apnea) - Patient has  a history of moderate obstructive sleep apnea. NPSG 2014 >> AHI 21/hr, titrated CPAP pressure 9cm h20.  She recently lost part to CPAP unit and has been unable to use.  Prior to this she was moderately compliant wearing 4-5 nights a week.  Since being off CPAP she reports increased daytime sleepiness, waking up gasping for air and headaches.  We will place DME order to renew CPAP supplies and provide patient with replacement water chamber.  Patient may potentially need repeat sleep study due to break in therapy. FU 3 months or sooner if needed.  Obesity, morbid, BMI 50 or higher - Encourage weight loss efforts.  Continue Wegovy 0.5 mg once weekly.  PCOS (polycystic ovarian syndrome) - Continue Metformin 500mg  daily    Martyn Ehrich, NP 03/18/2022

## 2022-03-18 NOTE — Assessment & Plan Note (Addendum)
-   Patient has a history of moderate obstructive sleep apnea. NPSG 2014 >> AHI 21/hr, titrated CPAP pressure 9cm h20.  She recently lost part to CPAP unit and has been unable to use.  Prior to this she was moderately compliant wearing 4-5 nights a week.  Since being off CPAP she reports increased daytime sleepiness, waking up gasping for air and headaches.  We will place DME order to renew CPAP supplies and provide patient with replacement water chamber.  Patient may potentially need repeat sleep study due to break in therapy. FU 3 months or sooner if needed.

## 2022-03-18 NOTE — Patient Instructions (Addendum)
Recommendations - Once we are able to get replacement part for your CPAP please resume use and aim to wear every 4-6 hours or longer - Continue to work on weight loss efforts as you have been - Do not drive experiencing excessive daytime sleepiness or fatigue - Please reach out to our office if you have not received an update from [facility] about replacement water chamber.  We may need to repeat either a home or in lab sleep study  Orders: - Can we please call [facility] and see if she needs repeat sleep study to renew CPAP supplies and get replacement water chamber for her current CPAP machine / along with download from machine   [facility] - [facility] / or sooner if needed   CPAP and BIPAP Information CPAP and BIPAP are methods that use air pressure to keep your airways open and to help you breathe well. CPAP and BIPAP use different amounts of pressure. Your health care provider will tell you whether CPAP or BIPAP would be more helpful for you. CPAP stands for "continuous positive airway pressure." With CPAP, the amount of pressure stays the same while you breathe in (inhale) and out (exhale). BIPAP stands for "bi-level positive airway pressure." With BIPAP, the amount of pressure will be higher when you inhale and lower when you exhale. This allows you to take larger breaths. CPAP or BIPAP may be used in the hospital, or your health care provider may want you to use it at home. You may need to have a sleep study before your health care provider can order a machine for you to use at home. What are the advantages? CPAP or BIPAP can be helpful if you have: Sleep apnea. Chronic obstructive pulmonary disease (COPD). Heart failure. Medical conditions that cause muscle weakness, including muscular dystrophy or amyotrophic lateral sclerosis (ALS). Other problems that cause breathing to be shallow, weak, abnormal, or difficult. CPAP and BIPAP are most commonly used for obstructive sleep apnea  (OSA) to keep the airways from collapsing when the muscles relax during sleep. What are the risks? Generally, this is a safe treatment. However, problems may occur, including: Irritated skin or skin sores if the mask does not fit properly. Dry or stuffy nose or nosebleeds. Dry mouth. Feeling gassy or bloated. Sinus or lung infection if the equipment is not cleaned properly. When should CPAP or BIPAP be used? In most cases, the mask only needs to be worn during sleep. Generally, the mask needs to be worn throughout the night and during any daytime naps. People with certain medical conditions may also need to wear the mask at other times, such as when they are awake. Follow instructions from your health care provider about when to use the machine. What happens during CPAP or BIPAP?  Both CPAP and BIPAP are provided by a small machine with a flexible plastic tube that attaches to a plastic mask that you wear. Air is blown through the mask into your nose or mouth. The amount of pressure that is used to blow the air can be adjusted on the machine. Your health care provider will set the pressure setting and help you find the best mask for you. Tips for using the mask Because the mask needs to be snug, some people feel trapped or closed-in (claustrophobic) when first using the mask. If you feel this way, you may need to get used to the mask. One way to do this is to hold the mask loosely over  your nose or mouth and then gradually apply the mask more snugly. You can also gradually increase the amount of time that you use the mask. Masks are available in various types and sizes. If your mask does not fit well, talk with your health care provider about getting a different one. Some common types of masks include: Full face masks, which fit over the mouth and nose. Nasal masks, which fit over the nose. Nasal pillow or prong masks, which fit into the nostrils. If you are using a mask that fits over your nose  and you tend to breathe through your mouth, a chin strap may be applied to help keep your mouth closed. Use a skin barrier to protect your skin as told by your health care provider. Some CPAP and BIPAP machines have alarms that may sound if the mask comes off or develops a leak. If you have trouble with the mask, it is very important that you talk with your health care provider about finding a way to make the mask easier to tolerate. Do not stop using the mask. There could be a negative impact on your health if you stop using the mask. Tips for using the machine Place your CPAP or BIPAP machine on a secure table or stand near an electrical outlet. Know where the on/off switch is on the machine. Follow instructions from your health care provider about how to set the pressure on your machine and when you should use it. Do not eat or drink while the CPAP or BIPAP machine is on. Food or fluids could get pushed into your lungs by the pressure of the CPAP or BIPAP. For home use, CPAP and BIPAP machines can be rented or purchased through home health care companies. Many different brands of machines are available. Renting a machine before purchasing may help you find out which particular machine works well for you. Your health insurance company may also decide which machine you may get. Keep the CPAP or BIPAP machine and attachments clean. Ask your health care provider for specific instructions. Check the humidifier if you have a dry stuffy nose or nosebleeds. Make sure it is working correctly. Follow these instructions at home: Take over-the-counter and prescription medicines only as told by your health care provider. Ask if you can take sinus medicine if your sinuses are blocked. Do not use any products that contain nicotine or tobacco. These products include cigarettes, chewing tobacco, and vaping devices, such as e-cigarettes. If you need help quitting, ask your health care provider. Keep all follow-up  visits. This is important. Contact a health care provider if: You have redness or pressure sores on your head, face, mouth, or nose from the mask or head gear. You have trouble using the CPAP or BIPAP machine. You cannot tolerate wearing the CPAP or BIPAP mask. Someone tells you that you snore even when wearing your CPAP or BIPAP. Get help right away if: You have trouble breathing. You feel confused. Summary CPAP and BIPAP are methods that use air pressure to keep your airways open and to help you breathe well. If you have trouble with the mask, it is very important that you talk with your health care provider about finding a way to make the mask easier to tolerate. Do not stop using the mask. There could be a negative impact to your health if you stop using the mask. Follow instructions from your health care provider about when to use the machine. This information is not intended  to replace advice given to you by your health care provider. Make sure you discuss any questions you have with your health care provider. Document Revised: 05/08/2021 Document Reviewed: 09/07/2020 Elsevier Patient Education  2023 [employer].

## 2022-03-18 NOTE — Telephone Encounter (Signed)
Per Buelah Manis she will need to verify if a new sleep study is needed.

## 2022-03-19 ENCOUNTER — Other Ambulatory Visit: Payer: Self-pay | Admitting: Family Medicine

## 2022-03-25 ENCOUNTER — Telehealth: Payer: Self-pay | Admitting: Primary Care

## 2022-03-25 DIAGNOSIS — G4733 Obstructive sleep apnea (adult) (pediatric): Secondary | ICD-10-CM

## 2022-03-25 NOTE — Telephone Encounter (Signed)
Received message from Terri at [facility] and pt told them she wants new machine  Pt will need a new sleep study due to break in cpap usage for a new machine.

## 2022-03-27 NOTE — Telephone Encounter (Signed)
Please place order for NPSG re: hx sleep apnea. Thanks

## 2022-03-27 NOTE — Telephone Encounter (Signed)
Attempted to call pt but unable to reach. Left message for her to return call. 

## 2022-03-28 NOTE — Telephone Encounter (Signed)
Called patient but call went straight to VM. Left message for her to call us back.  

## 2022-03-30 ENCOUNTER — Emergency Department (HOSPITAL_COMMUNITY)
Admission: EM | Admit: 2022-03-30 | Discharge: 2022-03-31 | Disposition: A | Discharge status: Home / Self Care | Payer: PRIVATE HEALTH INSURANCE | Attending: Emergency Medicine | Admitting: Emergency Medicine

## 2022-03-30 ENCOUNTER — Encounter (HOSPITAL_COMMUNITY): Payer: Self-pay

## 2022-03-30 ENCOUNTER — Emergency Department (HOSPITAL_COMMUNITY): Payer: PRIVATE HEALTH INSURANCE

## 2022-03-30 DIAGNOSIS — W228XXA Striking against or struck by other objects, initial encounter: Secondary | ICD-10-CM | POA: Insufficient documentation

## 2022-03-30 DIAGNOSIS — M546 Pain in thoracic spine: Secondary | ICD-10-CM | POA: Insufficient documentation

## 2022-03-30 DIAGNOSIS — M62830 Muscle spasm of back: Secondary | ICD-10-CM

## 2022-03-30 NOTE — ED Triage Notes (Signed)
Pt states that she went to sit on a toilet and the lid broke and hit her in the back, since has been having back pain in R upper shoulder and thoracic area.

## 2022-03-30 NOTE — ED Provider Triage Note (Signed)
Emergency Medicine Provider Triage Evaluation Note  Catherine [name] , a 41 y.o. female  was evaluated in triage.  Pt complains of upper back pain.  Patient states that she went to sit on the toilet in her hotel room when she rocked back in the tank behind her broke and water went everywhere.  She did not fall from the toilet.  She has no bleeding from anywhere.  She denies trauma to head or current blood thinner use.  Back pain is located in the right thoracic region.  She notices a cough that has been present for the past week and is requesting a chest x-ray.  Denies fever, IV drug use, cancer diagnosis, steroid use, saddle anesthesia, bowel/bladder dysfunction, weakness, sensory deficits.  Review of Systems  Positive:  Negative: See above  Physical Exam  BP 128/77   Pulse 96   Temp 98.4 F (36.9 C)   Resp 17   SpO2 96%  Gen:   Awake, no distress   Resp:  Normal effort  MSK:   Moves extremities without difficulty  Other:  Tender to palpation of right upper thoracic back.  No midline tenderness of cervical or thoracic spine.  Patient has full range of motion of bilateral shoulders.  Medical Decision Making  Medically screening exam initiated at 8:11 PM.  Appropriate orders placed.  [name] was informed that the remainder of the evaluation will be completed by another provider, this initial triage assessment does not replace that evaluation, and the importance of remaining in the ED until their evaluation is complete.    Peter Garter, [place] 03/30/22 2013

## 2022-03-31 ENCOUNTER — Other Ambulatory Visit (INDEPENDENT_AMBULATORY_CARE_PROVIDER_SITE_OTHER): Payer: Self-pay | Admitting: Family Medicine

## 2022-03-31 ENCOUNTER — Telehealth (INDEPENDENT_AMBULATORY_CARE_PROVIDER_SITE_OTHER): Payer: PRIVATE HEALTH INSURANCE | Admitting: Family Medicine

## 2022-03-31 ENCOUNTER — Encounter (INDEPENDENT_AMBULATORY_CARE_PROVIDER_SITE_OTHER): Payer: Self-pay | Admitting: Family Medicine

## 2022-03-31 DIAGNOSIS — E669 Obesity, unspecified: Secondary | ICD-10-CM | POA: Diagnosis not present

## 2022-03-31 DIAGNOSIS — E559 Vitamin D deficiency, unspecified: Secondary | ICD-10-CM

## 2022-03-31 DIAGNOSIS — R632 Polyphagia: Secondary | ICD-10-CM | POA: Diagnosis not present

## 2022-03-31 DIAGNOSIS — Z6841 Body Mass Index (BMI) 40.0 and over, adult: Secondary | ICD-10-CM

## 2022-03-31 MED ORDER — HYDROCODONE-ACETAMINOPHEN 10-325 MG PO TABS
0.5000 | ORAL_TABLET | ORAL | 0 refills | Status: DC | PRN
Start: 2022-03-31 — End: 2022-11-18

## 2022-03-31 MED ORDER — TOPIRAMATE 25 MG PO TABS: 25.0000 mg | ORAL_TABLET | Freq: Every day | ORAL | 0 refills | Status: DC

## 2022-03-31 MED ORDER — VITAMIN D (ERGOCALCIFEROL) 1.25 MG (50000 UNIT) PO CAPS: 50000.0000 [IU] | ORAL_CAPSULE | ORAL | 0 refills | Status: DC

## 2022-03-31 MED ORDER — HYDROCODONE-ACETAMINOPHEN 5-325 MG PO TABS
1.0000 | ORAL_TABLET | Freq: Once | ORAL | Status: AC
  Administered 2022-03-31: 1 via ORAL
  Filled 2022-03-31: qty 1

## 2022-03-31 NOTE — Discharge Instructions (Signed)
SEEK IMMEDIATE MEDICAL ATTENTION IF: New numbness, tingling, weakness, or problem with the use of your arms or legs.  Severe back pain not relieved with medications.  Change in bowel or bladder control.  Increasing pain in any areas of the body (such as chest or abdominal pain).  Shortness of breath, dizziness or fainting.  Nausea (feeling sick to your stomach), vomiting, fever, or sweats.  

## 2022-03-31 NOTE — ED Provider Notes (Signed)
[facility] EMERGENCY DEPARTMENT Provider Note   CSN: [id number] Arrival date & time: 03/30/22  1939     History  Chief Complaint  Patient presents with   Back Pain    KALAN RINN is a 41 y.o. female who presents emergency department with chief complaint of back injury.  Patient states he was at a hotel when she sat on a toilet, the toilet seat cracked and the back part of the toilet hit her in the middle of the back.  She had immediate pain in the middle of her back all the way across it has been progressively worsening.  She denies any other injuries.   Back Pain      Home Medications Prior to Admission medications   Medication Sig Start Date End Date Taking? Authorizing Provider  albuterol (VENTOLIN HFA) 108 (90 Base) MCG/ACT inhaler Inhale 2 puffs into the lungs every 6 (six) hours as needed for wheezing or shortness of breath. 10/09/21   Lilland, Percival Spanish, DO  aspirin-acetaminophen-caffeine (EXCEDRIN MIGRAINE) 250-250-65 MG per tablet Take 2 tablets by mouth 2 (two) times daily as needed for migraine. For headache    [provider]  buPROPion (WELLBUTRIN SR) 150 MG 12 hr tablet Take 1 tablet (150 mg total) by mouth 2 (two) times daily. 03/03/22   Langston Reusing, MD  celecoxib (CELEBREX) 200 MG capsule TAKE 1 CAPSULE BY MOUTH EVERY DAY AS NEEDED 03/19/22   Simmons-Robinson, Tawanna Cooler, MD  cetirizine (ZYRTEC) 10 MG tablet Take 10 mg by mouth daily.    [provider]  DULoxetine (CYMBALTA) 30 MG capsule TAKE 1 CAPSULE BY MOUTH EVERY DAY 02/28/22   Simmons-Robinson, Tawanna Cooler, MD  furosemide (LASIX) 20 MG tablet TAKE 1 TABLET BY MOUTH TWICE A DAY 10/08/21   Ganta, Anupa, DO  gabapentin (NEURONTIN) 300 MG capsule TAKE 2 CAPSULES (600 MG TOTAL) BY MOUTH 3 TIMES A DAY 11/16/20   Dollene Cleveland, DO  metFORMIN (GLUCOPHAGE-XR) 500 MG 24 hr tablet Take 1 tablet (500 mg total) by mouth daily with breakfast. 02/05/22   Autry-Lott, Randa Evens, DO  nystatin  ointment (MYCOSTATIN) Apply 1 application. topically 2 (two) times daily. 02/28/22   Sabino Dick, DO  pantoprazole (PROTONIX) 40 MG tablet TAKE 1 TABLET (40 MG TOTAL) BY MOUTH DAILY. TAKE 30 MINUTES PRIOR TO BREAKFAST 09/27/20   Meredith Pel, NP  Semaglutide-Weight Management (WEGOVY) 0.5 MG/0.5ML SOAJ Inject 0.5 mg into the skin once a week. 03/03/22   Langston Reusing, MD  tizanidine (ZANAFLEX) 2 MG capsule Take 1-2 capsules (2-4 mg total) by mouth at bedtime as needed for muscle spasms. 03/07/22   Simmons-Robinson, Tawanna Cooler, MD  topiramate (TOPAMAX) 25 MG tablet Take 1 tablet (25 mg total) by mouth daily. 03/03/22   Langston Reusing, MD  Vitamin D, Ergocalciferol, (DRISDOL) 1.25 MG (50000 UNIT) CAPS capsule Take 1 capsule (50,000 Units total) by mouth every 7 (seven) days. 03/03/22   Langston Reusing, MD      Allergies    Citrus    Review of Systems   Review of Systems  Musculoskeletal:  Positive for back pain.    Physical Exam Updated Vital Signs BP 128/77   Pulse 96   Temp 98.4 F (36.9 C)   Resp 17   SpO2 96%  Physical Exam Vitals and nursing note reviewed.  Constitutional:      General: She is not in acute distress.    Appearance: She is well-developed. She is not diaphoretic.  HENT:  Head: Normocephalic and atraumatic.     Right Ear: External ear normal.     Left Ear: External ear normal.     Nose: Nose normal.     Mouth/Throat:     Mouth: Mucous membranes are moist.  Eyes:     General: No scleral icterus.    Conjunctiva/sclera: Conjunctivae normal.  Cardiovascular:     Rate and Rhythm: Normal rate and regular rhythm.     Heart sounds: Normal heart sounds. No murmur heard.    No friction rub. No gallop.  Pulmonary:     Effort: Pulmonary effort is normal. No respiratory distress.     Breath sounds: Normal breath sounds.  Abdominal:     General: Bowel sounds are normal. There is no distension.     Palpations: Abdomen is soft. There is no  mass.     Tenderness: There is no abdominal tenderness. There is no guarding.  Musculoskeletal:     Cervical back: Normal range of motion.     Comments: Exam limited due to body habitus.  Patient has no obvious bruising or swelling.  She is tender to palpation along the mid thoracic portion of her back without bony tenderness.  Difficult to palpate musculature.  Skin:    General: Skin is warm and dry.  Neurological:     Mental Status: She is alert and oriented to person, place, and time.  Psychiatric:        Behavior: Behavior normal.     ED Results / Procedures / Treatments   Labs (all labs ordered are listed, but only abnormal results are displayed) Labs Reviewed - No data to display  EKG None  Radiology DG Chest 2 View  Result Date: 03/30/2022 CLINICAL DATA:  Cough.  Upper back pain. EXAM: CHEST - 2 VIEW COMPARISON:  05/08/2015 FINDINGS: Shallow inspiration. Heart size and pulmonary vascularity are normal for technique. Soft tissue attenuation is present. Lungs are clear. No pleural effusions. No pneumothorax. Mediastinal contours appear intact. Degenerative changes in the spine. No significant changes since previous study. IMPRESSION: No active cardiopulmonary disease. Electronically Signed   By: Burman Nieves M.D.   On: 03/30/2022 20:38    Procedures Procedures    Medications Ordered in ED Medications  HYDROcodone-acetaminophen (NORCO/VICODIN) 5-325 MG per tablet 1 tablet (has no administration in time range)    ED Course/ Medical Decision Making/ A&P                           Medical Decision Making Patient here with midthoracic back pain.  I have reviewed a 2 view chest x-ray which shows no acute findings.  Has no evidence of pneumothorax, hemothorax or rib fracture.  Suspect acute back spasm and injury.  PDMP reviewed.  Due to the fact that patient has multiple drug interactions the safest option for pain control for this patient is actually Norco.  I have given  her a very short course of this.  Discussed return precautions.  Amount and/or Complexity of Data Reviewed Radiology: independent interpretation performed.    Details: I visualized and interpreted 2 view chest x-ray.  Risk Prescription drug management.            Final Clinical Impression(s) / ED Diagnoses Final diagnoses:  None    Rx / DC Orders ED Discharge Orders     None         Arthor Captain, PA-C 03/31/22 0044    Dione Booze, MD 03/31/22  0410  

## 2022-04-01 NOTE — Progress Notes (Unsigned)
TeleHealth Visit:  Due to the COVID-19 pandemic, this visit was completed with telemedicine (audio/video) technology to reduce patient and provider exposure as well as to preserve personal protective equipment.   Catherine [name] has verbally consented to this TeleHealth visit. The patient is located at home, the provider is located at the [employer] [facility] office. The participants in this visit include the listed provider and patient. The visit was conducted today via Mychart Video.  Catherine [name] was unable to use realtime audiovisual technology today and the telehealth visit was conducted via telephone.  Chief Complaint: OBESITY Catherine [name] is here to discuss her progress with her obesity treatment plan along with follow-up of her obesity related diagnoses. Catherine [name] is on the Category 4 Plan and states she is following her eating plan approximately 90% of the time. Catherine [name] states she is walking 45 minutes 2-3 times per week.  Today's visit was #: 94  Starting weight: 468 lbs Starting date: 03/03/2022  Interim History: Catherine [name] was out of town for a family gathering and the back of toilet exploded and lid hit her in the back. Currently taking pain medications to help with pain. Appetite has been labile due to not being able to get [facility]. She is hopeful to get back on Wegovy in a couple of days. She thinks she has maintained her weight.   Subjective:   1. Polyphagia Catherine [name] is currently taking Topamax and Wellbutrin was also on GLP-1. No side effects noted on above medications  2. Vitamin D deficiency Catherine [name]'s last Vit D level of 49.8. Denies any nausea, vomiting or muscle weakness. She notes fatigue.  Assessment/Plan:   1. Polyphagia We will refill Topamax 25 mg by mouth for 1 month with 0 refills.  -Refill topiramate (TOPAMAX) 25 MG tablet; Take 1 tablet (25 mg total) by mouth daily.  Dispense: 30 tablet; Refill: 0  2. Vitamin D deficiency We will refill Vit D 50,000 IU once a week  for 1 month with 0 refills.  -Refill Vitamin D, Ergocalciferol, (DRISDOL) 1.25 MG (50000 UNIT) CAPS capsule; Take 1 capsule (50,000 Units total) by mouth every 7 (seven) days.  Dispense: 4 capsule; Refill: 0  3. Obesity with current BMI of 67.0 Catherine [name] is currently in the action stage of change. As such, her goal is to continue with weight loss efforts. She has agreed to the Category 4 Plan.   Exercise goals: As tolerated with current back injury.  Behavioral modification strategies: increasing lean protein intake, meal planning and cooking strategies, keeping healthy foods in the home, and planning for success.  Catherine [name] has agreed to follow-up with our clinic in 4 weeks. She was informed of the importance of frequent follow-up visits to maximize her success with intensive lifestyle modifications for her multiple health conditions.  Objective:   VITALS: Per patient if applicable, see vitals. GENERAL: Alert and in no acute distress. CARDIOPULMONARY: No increased WOB. Speaking in clear sentences.  PSYCH: Pleasant and cooperative. Speech normal rate and rhythm. Affect is appropriate. Insight and judgement are appropriate. Attention is focused, linear, and appropriate.  NEURO: Oriented as arrived to appointment on time with no prompting.   Lab Results  Component Value Date   CREATININE 0.91 06/20/2021   BUN 16 06/20/2021   NA 140 06/20/2021   K 4.5 06/20/2021   CL 100 06/20/2021   CO2 24 06/20/2021   Lab Results  Component Value Date   ALT 25 06/20/2021   AST 24 06/20/2021   ALKPHOS 61 06/20/2021  BILITOT 0.6 06/20/2021   Lab Results  Component Value Date   HGBA1C 5.5 01/13/2022   HGBA1C 5.3 06/20/2021   HGBA1C 5.4 09/11/2020   HGBA1C 5.4 03/06/2020   HGBA1C 5.5 11/22/2019   Lab Results  Component Value Date   INSULIN 39.8 (H) 01/13/2022   INSULIN 29.4 (H) 06/20/2021   INSULIN 43.4 (H) 09/11/2020   INSULIN 39.2 (H) 03/06/2020   INSULIN 32.5 (H) 11/22/2019   Lab  Results  Component Value Date   TSH 2.670 01/13/2022   Lab Results  Component Value Date   CHOL 146 07/26/2019   HDL 46 07/26/2019   LDLCALC 90 07/26/2019   TRIG 45 07/26/2019   CHOLHDL 3.6 11/21/2015   Lab Results  Component Value Date   VD25OH 49.8 01/13/2022   VD25OH 38.6 06/20/2021   VD25OH 47.6 09/11/2020   Lab Results  Component Value Date   WBC 11.1 (H) 07/26/2019   HGB 12.9 07/26/2019   HCT 38.1 07/26/2019   MCV 88 07/26/2019   PLT 219 07/26/2019   Lab Results  Component Value Date   IRON 25 (L) 05/18/2017   TIBC 363 05/18/2017   FERRITIN 16 05/18/2017    Attestation Statements:   Reviewed by clinician on day of visit: allergies, medications, problem list, medical history, surgical history, family history, social history, and previous encounter notes.  I, Fortino Sic, RMA am acting as transcriptionist for Reuben Likes, MD.  I have reviewed the above documentation for accuracy and completeness, and I agree with the above. - ***

## 2022-04-08 ENCOUNTER — Ambulatory Visit (INDEPENDENT_AMBULATORY_CARE_PROVIDER_SITE_OTHER): Payer: No Typology Code available for payment source | Admitting: Family Medicine

## 2022-04-08 ENCOUNTER — Encounter: Payer: Self-pay | Admitting: Family Medicine

## 2022-04-08 VITALS — BP 133/83 | HR 98 | Ht 67.0 in | Wt >= 6400 oz

## 2022-04-08 DIAGNOSIS — M546 Pain in thoracic spine: Secondary | ICD-10-CM | POA: Insufficient documentation

## 2022-04-08 NOTE — Assessment & Plan Note (Signed)
Patient presents for follow-up from her ED visit on 6/18 from back injury incurred from a toilet lid falling on back.  Unfortunately back x-ray was not performed in the ED, so we will obtain today given she has midline thoracic back pain and tenderness to palpation.  No loss of bowel or bladder function.  Recommended alternating Tylenol and ibuprofen, and already has tizanidine a muscle relaxer that she takes as needed for her bursitis.  Recommended using this as needed for her back pain.  Advised to continue alternating heat and ice. We discussed weight loss, which will also help with her pain. [facility] and unfortunately her GLP-1 is not in stock so she is not currently taking it. Recommended follow-up in 2 weeks for her back pain and can give her exercises/refer to PT at that time as well.  Return precautions discussed.

## 2022-04-08 NOTE — Progress Notes (Signed)
    SUBJECTIVE:   CHIEF COMPLAINT / HPI:   Patient presents for follow up on back pain   Was seen in the ED on 6/18 due to back injury that occurred while sitting on the toilet - toilet cracked and back of toilet  lid hit the middle of her back. Felt she clinched her back pretty tightly when this occurred. Chest XR performed in ED unremarkable though no back XR was performed. She was prescribed Norco due to drug interactions.   Today endorses 6/10 midline back pain. Does feel improvement from when injury initially occurred. States middle of spine hurts the most. More uncomfortable to sit. When standing feels it more in the lower back. Denies radiation of pain. No urinary or bowel issues. States the Norco helped with some of the pain. Has tried Ibuprofen and Bayer back and body. Has been doing cold and warm compresses, soaking in tub.   Middle of spine hurts the most. More uncomfrtable to sit. When standing feels it more in the lower back. Denies radiation. No urinary or bowel issues. Does have bursitis of R hip for which she takes a muscle relaxer and NSAID for. Back pain preventing sleep, open to trying a muscle relaxer   PERTINENT  PMH / PSH: Reviewed   OBJECTIVE:   BP 133/83   Pulse 98   Ht 5\' 7"  (1.702 m)   Wt (!) 438 lb (198.7 kg)   SpO2 100%   BMI 68.60 kg/m    Physical exam General: well appearing, NAD Cardiovascular: RRR, no murmurs Lungs: CTAB. Normal WOB Abdomen: soft, non-distended, non-tender Skin: warm, dry. No edema  ASSESSMENT/PLAN:   Thoracic back pain Patient presents for follow-up from her ED visit on 6/18 from back injury incurred from a toilet lid falling on back.  Unfortunately back x-ray was not performed in the ED, so we will obtain today given she has midline thoracic back pain and tenderness to palpation.  No loss of bowel or bladder function.  Recommended alternating Tylenol and ibuprofen, and already has tizanidine a muscle relaxer that she takes as  needed for her bursitis.  Recommended using this as needed for her back pain.  Advised to continue alternating heat and ice. We discussed weight loss, which will also help with her pain. Currently being seen by [facility] and unfortunately her GLP-1 is not in stock so she is not currently taking it. Recommended follow-up in 2 weeks for her back pain and can give her exercises/refer to PT at that time as well.  Return precautions discussed.     7/18, DO [facility]

## 2022-04-09 ENCOUNTER — Other Ambulatory Visit: Payer: Self-pay | Admitting: Family Medicine

## 2022-04-09 ENCOUNTER — Ambulatory Visit
Admission: RE | Admit: 2022-04-09 | Discharge: 2022-04-09 | Disposition: A | Discharge status: Home / Self Care | Payer: No Typology Code available for payment source | Source: Ambulatory Visit | Attending: Family Medicine | Admitting: Family Medicine

## 2022-04-09 DIAGNOSIS — M546 Pain in thoracic spine: Secondary | ICD-10-CM

## 2022-04-09 NOTE — Telephone Encounter (Signed)
Please see encounter from 6/26 mychart message for further information on this. Will close this encounter.

## 2022-04-10 ENCOUNTER — Other Ambulatory Visit (HOSPITAL_COMMUNITY): Payer: Self-pay

## 2022-04-10 ENCOUNTER — Encounter: Payer: Self-pay | Admitting: Family Medicine

## 2022-04-15 ENCOUNTER — Other Ambulatory Visit (INDEPENDENT_AMBULATORY_CARE_PROVIDER_SITE_OTHER): Payer: Self-pay | Admitting: Family Medicine

## 2022-04-15 DIAGNOSIS — R632 Polyphagia: Secondary | ICD-10-CM

## 2022-04-16 ENCOUNTER — Encounter (INDEPENDENT_AMBULATORY_CARE_PROVIDER_SITE_OTHER): Payer: Self-pay | Admitting: Family Medicine

## 2022-04-16 ENCOUNTER — Other Ambulatory Visit (HOSPITAL_COMMUNITY): Payer: Self-pay

## 2022-04-16 NOTE — Telephone Encounter (Signed)
Please advise 

## 2022-04-17 ENCOUNTER — Telehealth: Payer: Self-pay | Admitting: Primary Care

## 2022-04-17 NOTE — Telephone Encounter (Signed)
Patient returns call to nurse line.   Patient reports she is taking Celebrex. Patient reports Tizanidine is making her sleepy during the day and she can only take at night.   Patient advised to limit NSAIDs.   Patient would like to move forward with Ortho.   Patient would like opinion on using a supportive pillow for her back during work hours.

## 2022-04-17 NOTE — Telephone Encounter (Signed)
Order was just placed on 6/26.  Called pt and made her aware study will need to be precerted and then we can call her to schedule.  Nothing further needed at this time.

## 2022-04-18 ENCOUNTER — Other Ambulatory Visit: Payer: Self-pay | Admitting: Family Medicine

## 2022-04-18 DIAGNOSIS — M546 Pain in thoracic spine: Secondary | ICD-10-CM

## 2022-04-22 ENCOUNTER — Other Ambulatory Visit: Payer: Self-pay | Admitting: Family Medicine

## 2022-04-22 DIAGNOSIS — M7061 Trochanteric bursitis, right hip: Secondary | ICD-10-CM

## 2022-04-23 ENCOUNTER — Encounter (INDEPENDENT_AMBULATORY_CARE_PROVIDER_SITE_OTHER): Payer: Self-pay | Admitting: Family Medicine

## 2022-04-23 ENCOUNTER — Other Ambulatory Visit (HOSPITAL_COMMUNITY): Payer: Self-pay

## 2022-04-23 ENCOUNTER — Ambulatory Visit (INDEPENDENT_AMBULATORY_CARE_PROVIDER_SITE_OTHER): Payer: PRIVATE HEALTH INSURANCE | Admitting: Family Medicine

## 2022-04-23 VITALS — BP 140/84 | HR 92 | Temp 98.0°F | Ht 67.0 in | Wt >= 6400 oz

## 2022-04-23 DIAGNOSIS — R632 Polyphagia: Secondary | ICD-10-CM

## 2022-04-23 DIAGNOSIS — F3289 Other specified depressive episodes: Secondary | ICD-10-CM

## 2022-04-23 DIAGNOSIS — E559 Vitamin D deficiency, unspecified: Secondary | ICD-10-CM | POA: Diagnosis not present

## 2022-04-23 DIAGNOSIS — Z6841 Body Mass Index (BMI) 40.0 and over, adult: Secondary | ICD-10-CM

## 2022-04-23 DIAGNOSIS — E282 Polycystic ovarian syndrome: Secondary | ICD-10-CM | POA: Diagnosis not present

## 2022-04-23 DIAGNOSIS — E66813 Obesity, class 3: Secondary | ICD-10-CM

## 2022-04-23 DIAGNOSIS — E669 Obesity, unspecified: Secondary | ICD-10-CM

## 2022-04-23 MED ORDER — VITAMIN D (ERGOCALCIFEROL) 1.25 MG (50000 UNIT) PO CAPS: 50000.0000 [IU] | ORAL_CAPSULE | ORAL | 0 refills | Status: DC

## 2022-04-23 MED ORDER — BUPROPION HCL ER (SR) 150 MG PO TB12: 150.0000 mg | ORAL_TABLET | Freq: Two times a day (BID) | ORAL | 0 refills | Status: DC

## 2022-04-23 MED ORDER — TOPIRAMATE 25 MG PO TABS: 25.0000 mg | ORAL_TABLET | Freq: Every day | ORAL | 0 refills | Status: DC

## 2022-04-23 MED ORDER — WEGOVY 0.5 MG/0.5ML ~~LOC~~ SOAJ
0.5000 mg | SUBCUTANEOUS | 0 refills | Status: DC
  Filled 2022-04-23 – 2022-05-22 (×3): qty 2, 28d supply, fill #0

## 2022-04-24 ENCOUNTER — Other Ambulatory Visit: Payer: Self-pay | Admitting: Family Medicine

## 2022-04-24 DIAGNOSIS — M7061 Trochanteric bursitis, right hip: Secondary | ICD-10-CM

## 2022-04-24 NOTE — Telephone Encounter (Signed)
Patient calls nurse line to check status of rx refill. Informed patient that we received request yesterday. Advised of rx refill policy.   Veronda Prude, RN

## 2022-04-25 MED ORDER — TIZANIDINE HCL 2 MG PO CAPS: 2.0000 mg | ORAL_CAPSULE | Freq: Every evening | ORAL | 0 refills | Status: DC | PRN

## 2022-04-27 ENCOUNTER — Other Ambulatory Visit: Payer: Self-pay | Admitting: Family Medicine

## 2022-04-28 ENCOUNTER — Other Ambulatory Visit: Payer: Self-pay | Admitting: Family Medicine

## 2022-04-28 NOTE — Progress Notes (Signed)
Chief Complaint:   OBESITY Catherine [name] is here to discuss her progress with her obesity treatment plan along with follow-up of her obesity related diagnoses. Catherine [name] is on the Category 4 Plan and states she is following her eating plan approximately 85% of the time. Catherine [name] states she is exercising 0 minutes 0 times per week.  Today's visit was #: 95 Starting weight: 468 lbs Starting date: 10/27/2016 Today's weight: 427 lbs Today's date: 04/23/2022 Total lbs lost to date: 41 lbs Total lbs lost since last in-office visit: 1  Interim History: Catherine [name] is taking Zanaflex for back spasms currently. Going to start with Ortho on Aug 3rd. She feels sleepy and drowsy. Foodwise she is trying to stick to plan as much as possible. When back on Wegovy she could definitely tell a difference. She wants to continue on plan as much as she can in next few weeks.  Subjective:   1. PCOS (polycystic ovarian syndrome) Catherine [name] is currently on Metformin. Denies GI side effects.  2. Polyphagia Catherine [name] is currently taking Topiramate. Some improvement in intake with Topiramate and GLP-1.  3. Vitamin D deficiency Catherine [name] denies any nausea, vomiting or muscle weakness. She notes fatigue.  4. Other depression, with emotional eating Catherine [name] is currently taking Wellbutrin 150 twice a day. Denies any side effects. Her blood pressure slightly elevated (likely due to pain).  Assessment/Plan:   1. PCOS (polycystic ovarian syndrome) Catherine [name] will continue taking Metformin.  2. Polyphagia We will refill Topamax 25 mg by mouth daily for 1 month with 0 refills.  -Refill topiramate (TOPAMAX) 25 MG tablet; Take 1 tablet (25 mg total) by mouth daily.  Dispense: 30 tablet; Refill: 0  3. Vitamin D deficiency We will refill Vit D 50,000 IU once a week for 1 month with 0 refills.  -Refill Vitamin D, Ergocalciferol, (DRISDOL) 1.25 MG (50000 UNIT) CAPS capsule; Take 1 capsule (50,000 Units total) by mouth every 7 (seven)  days.  Dispense: 4 capsule; Refill: 0  4. Other depression, with emotional eating We will refill Wellbutrin SR 150 mg by mouth twice a day for 1 month with 0 refills.  -Refill buPROPion (WELLBUTRIN SR) 150 MG 12 hr tablet; Take 1 tablet (150 mg total) by mouth 2 (two) times daily.  Dispense: 60 tablet; Refill: 0  5. Obesity with current BMI of 66.86 We will refill Wegovy 0.5 mg SubQ once weekly for 1 month with 0 refills.   -Refill Semaglutide-Weight Management (WEGOVY) 0.5 MG/0.5ML SOAJ; Inject 0.5 mg into the skin once a week.  Dispense: 2 mL; Refill: 0  Catherine [name] is currently in the action stage of change. As such, her goal is to continue with weight loss efforts. She has agreed to the Category 4 Plan.   Exercise goals: No exercise has been prescribed at this time.  Behavioral modification strategies: increasing lean protein intake, meal planning and cooking strategies, keeping healthy foods in the home, and planning for success.  Catherine [name] has agreed to follow-up with our clinic in 4 weeks. She was informed of the importance of frequent follow-up visits to maximize her success with intensive lifestyle modifications for her multiple health conditions.   Objective:   Blood pressure 140/84, pulse 92, temperature 98 F (36.7 C), height 5\' 7"  (1.702 m), weight (!) 427 lb (193.7 kg), SpO2 99 %. Body mass index is 66.88 kg/m.  General: Cooperative, alert, well developed, in no acute distress. HEENT: Conjunctivae and lids unremarkable. Cardiovascular: Regular rhythm.  Lungs: Normal work of breathing. Neurologic: No  focal deficits.   Lab Results  Component Value Date   CREATININE 0.91 06/20/2021   BUN 16 06/20/2021   NA 140 06/20/2021   K 4.5 06/20/2021   CL 100 06/20/2021   CO2 24 06/20/2021   Lab Results  Component Value Date   ALT 25 06/20/2021   AST 24 06/20/2021   ALKPHOS 61 06/20/2021   BILITOT 0.6 06/20/2021   Lab Results  Component Value Date   HGBA1C 5.5 01/13/2022    HGBA1C 5.3 06/20/2021   HGBA1C 5.4 09/11/2020   HGBA1C 5.4 03/06/2020   HGBA1C 5.5 11/22/2019   Lab Results  Component Value Date   INSULIN 39.8 (H) 01/13/2022   INSULIN 29.4 (H) 06/20/2021   INSULIN 43.4 (H) 09/11/2020   INSULIN 39.2 (H) 03/06/2020   INSULIN 32.5 (H) 11/22/2019   Lab Results  Component Value Date   TSH 2.670 01/13/2022   Lab Results  Component Value Date   CHOL 146 07/26/2019   HDL 46 07/26/2019   LDLCALC 90 07/26/2019   TRIG 45 07/26/2019   CHOLHDL 3.6 11/21/2015   Lab Results  Component Value Date   VD25OH 49.8 01/13/2022   VD25OH 38.6 06/20/2021   VD25OH 47.6 09/11/2020   Lab Results  Component Value Date   WBC 11.1 (H) 07/26/2019   HGB 12.9 07/26/2019   HCT 38.1 07/26/2019   MCV 88 07/26/2019   PLT 219 07/26/2019   Lab Results  Component Value Date   IRON 25 (L) 05/18/2017   TIBC 363 05/18/2017   FERRITIN 16 05/18/2017   Attestation Statements:   Reviewed by clinician on day of visit: allergies, medications, problem list, medical history, surgical history, family history, social history, and previous encounter notes.  I, Fortino Sic, RMA am acting as transcriptionist for Reuben Likes, MD.  I have reviewed the above documentation for accuracy and completeness, and I agree with the above. - Reuben Likes, MD

## 2022-05-01 ENCOUNTER — Encounter: Payer: Self-pay | Admitting: Family Medicine

## 2022-05-01 ENCOUNTER — Ambulatory Visit (INDEPENDENT_AMBULATORY_CARE_PROVIDER_SITE_OTHER): Payer: No Typology Code available for payment source | Admitting: Family Medicine

## 2022-05-01 ENCOUNTER — Other Ambulatory Visit: Payer: Self-pay

## 2022-05-01 VITALS — BP 141/79 | HR 101 | Wt >= 6400 oz

## 2022-05-01 DIAGNOSIS — M546 Pain in thoracic spine: Secondary | ICD-10-CM | POA: Diagnosis not present

## 2022-05-01 MED ORDER — METFORMIN HCL ER 500 MG PO TB24: ORAL_TABLET | ORAL | 1 refills | Status: DC

## 2022-05-01 MED ORDER — TIZANIDINE HCL 4 MG PO TABS: 4.0000 mg | ORAL_TABLET | Freq: Three times a day (TID) | ORAL | 0 refills | Status: DC | PRN

## 2022-05-01 NOTE — Patient Instructions (Signed)
It was great seeing you today!  You came in for follow-up on your back pain, and I increased your tizanidine dose refilled your metformin.  Continue doing exercises, also using the Tylenol and ibuprofen as needed for pain.  Follow-up with Ortho at your scheduled appointment and reach out to [name] if anything else is needed after you see them.  Visit Reminders: - Stop by the pharmacy to pick up your prescriptions  - Continue to work on your healthy eating habits and incorporating exercise into your daily life.  -  Feel free to call with any questions or concerns at any time, at [phone].   Take care,  Dr. Cora Collum [facility]

## 2022-05-01 NOTE — Progress Notes (Signed)
    SUBJECTIVE:   CHIEF COMPLAINT / HPI:   Patient presents for follow up on her back pain. Was seen in clinic 7/20 following a thoracic back injury and X rays obtained which showed mild anterior wedging of T12 and T11, anterior bridging osteophytes, degenerative changes and slight kyphodextroscloliosis. Today endorses improvement in pain. States she bought a special pillow for her back at work and in the car which helps a lot. Spasms come and go but much improved from a couple of weeks ago. Still taking the Tizanidine and would like refill.   PERTINENT  PMH / PSH: Reviewed   OBJECTIVE:   BP (!) 141/79   Pulse (!) 101   Wt (!) 431 lb (195.5 kg)   SpO2 97%   BMI 67.50 kg/m    General: alert, NAD CV: tachycardic. No murmurs  Resp: CTAB normal WOB GI: soft, non distended  MSK thoracic spine: no gross deformity or asymmetry, swelling or ecchymosis. sensation intact in the L4-S1 nerve root distribution b/l  ASSESSMENT/PLAN:   Thoracic back pain X rays obtained after last visit showed mild anterior wedging of T12 and T11, anterior bridging osteophytes, degenerative changes and slight kyphodextroscloliosis. Increased tizanidine dose to 4mg  q8h prn. Advised to continue doing exercises, and alternate Tylenol and ibuprofen as needed for pain.  Will follow-up with Ortho at scheduled appointment and reach out to if anything else is needed.    Misc. Signed for handicap placard renewal Refilled Metformin 500mg  daily   [name], DO [facility]

## 2022-05-04 NOTE — Assessment & Plan Note (Signed)
X rays obtained after last visit showed mild anterior wedging of T12 and T11, anterior bridging osteophytes, degenerative changes and slight kyphodextroscloliosis. Increased tizanidine dose to 4mg  q8h prn. Advised to continue doing exercises, and alternate Tylenol and ibuprofen as needed for pain.  Will follow-up with Ortho at scheduled appointment and reach out to if anything else is needed.

## 2022-05-08 ENCOUNTER — Other Ambulatory Visit (HOSPITAL_COMMUNITY): Payer: Self-pay

## 2022-05-12 NOTE — Telephone Encounter (Signed)
Please advise 

## 2022-05-15 ENCOUNTER — Ambulatory Visit (INDEPENDENT_AMBULATORY_CARE_PROVIDER_SITE_OTHER): Payer: No Typology Code available for payment source | Admitting: Surgery

## 2022-05-15 ENCOUNTER — Encounter: Payer: Self-pay | Admitting: Surgery

## 2022-05-15 VITALS — BP 127/86

## 2022-05-15 DIAGNOSIS — M4726 Other spondylosis with radiculopathy, lumbar region: Secondary | ICD-10-CM

## 2022-05-15 NOTE — Progress Notes (Signed)
Office Visit Note   Patient: Catherine [name]           Date of Birth: Feb 14, 1981           MRN: [id number] Visit Date: 05/15/2022              Requested by: Latrelle Dodrill, MD [address] [place],  [place] [address] PCP: Bess Kinds, MD   Assessment & Plan: Visit Diagnoses:  1. Other spondylosis with radiculopathy, lumbar region     Plan: At this point recommend attempting conservative treatment with formal PT.  Advised patient that they could try dry needling.  We did discuss weight loss and she states that she is currently followed by weight loss clinic.  Stated that her insurance would not cover bariatric surgery.  Patient mentioned to me that her primary care provider told her to ask if we wanted to change any of her medications that that would be fine.  Advised patient that she should continue pain management through her PCP.  Follow-up in 6 weeks for recheck.  Advised patient that if she does not get any improvement that we may consider repeating lumbar MRI to compare to the study that was done in 2018.  Follow-Up Instructions: Return in about 6 weeks (around 06/26/2022) for With Dr. Ophelia Charter for recheck back pain.   Orders:  Orders Placed This Encounter  Procedures   Ambulatory referral to Physical Therapy   No orders of the defined types were placed in this encounter.     Procedures: No procedures performed   Clinical Data: No additional findings.   Subjective: Chief Complaint  Patient presents with   Lower Back - Pain    HPI 41 year old black female comes in today with complaints of acute on chronic back pain.  I reviewed patient's chart and she had a lumbar MRI April 08, 2017 and that study showed:  CLINICAL DATA:  Two-month history of right leg pain and weakness.   EXAM: MRI LUMBAR SPINE WITHOUT AND WITH CONTRAST   TECHNIQUE: Multiplanar and multiecho pulse sequences of the lumbar spine were obtained without and with intravenous  contrast.   CONTRAST:  23mL MULTIHANCE GADOBENATE DIMEGLUMINE 529 MG/ML IV SOLN   COMPARISON:  Radiography 02/12/2007   FINDINGS: Segmentation:  5 lumbar type vertebral bodies.   Alignment:  Normal   Vertebrae:  No fracture or primary bone lesion.   Conus medullaris: Extends to the L2 level and appears normal.   Paraspinal and other soft tissues: Normal   Disc levels:   No abnormality at L3-4 or above. The discs are normal. The canal and foramina are widely patent.   L4-5: Minimal desiccation of the disc. No bulge or herniation. Bilateral facet arthropathy. No slippage or stenosis.   L5-S1: Normal appearance of the disc. Bilateral facet arthropathy. 14 mm synovial cyst projecting inward from the facet on the right, causing stenosis of the right lateral recess and likely compressing the right S1 nerve.   IMPRESSION: L4-5: Bilateral facet arthropathy without stenosis. This could be a cause of back pain or referred facet syndrome pain. Mild disc desiccation without bulge or herniation.   L5-S1: Bilateral facet arthropathy. 14 mm synovial cyst projecting inward from the facet on the right, compressing the right S1 nerve.     Electronically Signed   By: Paulina Fusi M.D.   On: 04/08/2017 13:32  At that time she was referred to neurosurgeon Dr. Conchita Paris who referred patient to physical therapy for several weeks.  She is continue to have ongoing pain over the last several years but she has been having increased back spasms since an injury that occurred that is well documented in primary care note and emergency department visit March 30, 2022.  At the time of presentation to the emergency department it is documented that patient was at a hotel when she sat on a toilet and the toilet seat cracked and part of the toilet hit her in the middle of her back.  Since that time she has been having increased spasms in her back.  Has seen her primary care provider a couple of times most  recently a couple weeks ago was referred to our office.  She has been taking Celebrex and tizanidine without any improvement.  He has not done any formal PT.  Had thoracic spine x-rays April 10, 2022 and that showed:  CLINICAL DATA:  Thoracic back pain following thoracic back trauma.   EXAM: THORACIC SPINE - 3 VIEWS   COMPARISON:  PA and lateral chest series 05/08/15 and 03/30/22.   FINDINGS: There is normal bone mineralization. There is slight thoracic kyphodextroscoliosis. There is no AP listhesis.   There are mild anterior wedge compression deformities of what are believed to be the T11 and T12 vertebral bodies.   This was definitely present on 03/30/2022 but I am not entirely certain that this was present on 05/08/2015.   Acuity is indeterminate but a chronic age is favored as there is a bridging anterior osteophyte at T11-12.   The remaining thoracic vertebra are normal in heights. There is mild thoracic degenerative disc change and spondylosis. Arthritic changes are not seen.   IMPRESSION: 1. Mild anterior wedging of what are believed to be the T12 and T11 vertebrae, with anterior bridging osteophytes suggesting a chronic age but not entirely certain this was present in 2016. 2. Degenerative changes and slight kyphodextroscoliosis. No AP listhesis.     Electronically Signed   By: Almira Bar M.D.   On: 04/10/2022 23:20  She has had intermittent episodes of pain radiating down the right leg but her biggest problem is with the thoracolumbar spasms.   Review of Systems No current complaints of cardiopulmonary GI/GU issues  Objective: Vital Signs: BP 127/86 (BP Location: Left Wrist)    Weight 431 pounds  Physical Exam Constitutional:      Appearance: She is obese.  HENT:     Head: Normocephalic.  Pulmonary:     Effort: Pulmonary effort is normal. No respiratory distress.  Musculoskeletal:     Comments: Patient has bilateral thoracolumbar tenderness.  Negative  logroll bilateral hips.  Negative straight leg raise.  No focal motor deficits.  Psychiatric:        Mood and Affect: Mood normal.     Ortho Exam  Specialty Comments:  No specialty comments available.  Imaging: No results found.   PMFS History: Patient Active Problem List   Diagnosis Date Noted   Thoracic back pain 04/08/2022   SOBOE (shortness of breath on exertion) 03/10/2022   At risk for activity intolerance 03/10/2022   Skin lesion 02/28/2022   Genetic testing 02/26/2022   At high risk for breast cancer 02/26/2022   PCOS (polycystic ovarian syndrome) 02/07/2022   Abnormal mammogram 02/07/2022   Family history of breast cancer 02/07/2022   Family history of pancreatic cancer 02/07/2022   Family history of prostate cancer 02/07/2022   Chronic pain syndrome 07/26/2021   Polyphagia 01/23/2021   Bursitis 01/01/2021   Pain in  both lower extremities 11/16/2020   Peroneal neuropathy 11/16/2020   Acute otitis externa of both ears 03/02/2020   Class 3 severe obesity with serious comorbidity and body mass index (BMI) greater than or equal to 70 in adult (HCC) 06/10/2017   Fluid retention 06/10/2017   Menorrhagia 05/18/2017   Sacral radiculopathy 02/04/2017   Insulin resistance 02/02/2017   At risk for violence to self related to depression 02/02/2017   Morbid obesity (HCC) 01/01/2017   Sore throat (viral) 11/21/2016   Vitamin D deficiency 11/12/2016   Asthma 07/25/2016   Abdominal pain 06/04/2016   Otitis, externa, infective 06/04/2016   Dysuria 03/20/2016   Gout 11/21/2015   Obesity, morbid, BMI 50 or higher (HCC) 05/14/2015   BIPOLAR DISORDER UNSPECIFIED 11/22/2008   Depression 12/10/2006   MIGRAINE, UNSPEC., W/O INTRACTABLE MIGRAINE 12/10/2006   GASTROESOPHAGEAL REFLUX, NO ESOPHAGITIS 12/10/2006   OSA (obstructive sleep apnea) 12/10/2006   Past Medical History:  Diagnosis Date   Allergy    Anxiety    Arthritis    Asthma    since baby, seasonal   ASTHMA,  UNSPECIFIED 12/10/2006   Qualifier: Diagnosis of  By: Abundio Miu     CELLULITIS AND ABSCESS OF LEG EXCEPT FOOT 12/30/2007   Qualifier: Diagnosis of  By: Daphine Deutscher FNP, [place]     Cyst of bone    right side of spine   DEPENDENT EDEMA, LEGS, BILATERAL 11/22/2008   Qualifier: Diagnosis of  By: Barbaraann Barthel MD, [place]     Depression 2003Dx   Edema    Food allergy    citris   FOOT PAIN, RIGHT 11/22/2008   Qualifier: Diagnosis of  By: Barbaraann Barthel MD, [place]     Gastric ulcer    GERD (gastroesophageal reflux disease)    Gout    Hypertension    took Norvasc for 3 months, doctor discontinued no longer on BP medications   Joint pain    KNEE INJURY, LEFT 01/01/2009   Qualifier: Diagnosis of  By: Delrae Alfred MD, [place]     Migraine    ONYCHOMYCOSIS, TOENAILS 11/22/2008   Qualifier: Diagnosis of  By: Barbaraann Barthel MD, [place]     Shortness of breath    Sleep apnea    TINEA PEDIS 11/22/2008   Qualifier: Diagnosis of  By: Barbaraann Barthel MD, [place]      Family History  Problem Relation Age of Onset   Hypertension Mother    Hyperlipidemia Mother    Cancer Mother        bone cancer   Heart disease Mother    Sudden death Mother    Stroke Mother    Thyroid disease Mother    Sleep apnea Mother    Obesity Mother    Sudden death Father    Hypertension Father    Heart disease Father    Hyperlipidemia Father    Prostate cancer Father    Lung cancer Father 36   Obesity Father    Alcoholism Father    Heart disease Sister    Hypertension Sister    Thyroid cancer Sister        dx. 69s   Breast cancer Sister 72   Pancreatic cancer Sister 27   Sudden death Brother    Hypertension Brother    Hyperlipidemia Brother    Heart attack Brother    Heart disease Brother    Stroke Brother    Alcoholism Brother    Drug abuse Brother    Breast cancer Paternal Aunt 36   Prostate cancer Paternal  Uncle    Prostate cancer Paternal Uncle    Prostate cancer Paternal Uncle    Prostate cancer Paternal Uncle     Diabetes Neg Hx    Colon cancer Neg Hx    Stomach cancer Neg Hx    Esophageal cancer Neg Hx    Rectal cancer Neg Hx    Liver cancer Neg Hx     Past Surgical History:  Procedure Laterality Date   DILITATION & CURRETTAGE/HYSTROSCOPY WITH NOVASURE ABLATION N/A 07/03/2017   Procedure: DILATATION & CURETTAGE/HYSTEROSCOPY WITH NOVASURE ABLATION;  Surgeon: Genia Del, MD;  Location: [facility];  Service: Gynecology;  Laterality: N/A;   UPPER GI ENDOSCOPY     WISDOM TOOTH EXTRACTION  2008   x4   Social History   Occupational History   Occupation: Clinical cytogeneticist    Employer: [employer]    Comment: 40+ hours a week  Tobacco Use   Smoking status: Former    Packs/day: 0.50    Years: 18.00    Total pack years: 9.00    Types: Cigarettes    Start date: 10/13/1996    Quit date: 01/12/2015    Years since quitting: 7.3   Smokeless tobacco: Never  Vaping Use   Vaping Use: Former  Substance and Sexual Activity   Alcohol use: Yes    Alcohol/week: 1.0 standard drink of alcohol    Types: 1 Standard drinks or equivalent per week    Comment: rarely drinks   Drug use: No    Comment: marjuana - former use, quit 4 years ago   Sexual activity: Never    Partners: Female

## 2022-05-16 ENCOUNTER — Other Ambulatory Visit (HOSPITAL_COMMUNITY): Payer: Self-pay

## 2022-05-20 NOTE — Telephone Encounter (Signed)
Scheduled in lab study for 9/1, called pt and made her aware of appt info.

## 2022-05-20 NOTE — Telephone Encounter (Signed)
Patient has been scheduled for In lab sleep study on Friday 06/13/2022. Patient has been called and made aware.

## 2022-05-20 NOTE — Telephone Encounter (Signed)
[facility] has given appt info to pt.  Will route to triage for message to be closed.

## 2022-05-21 ENCOUNTER — Encounter (INDEPENDENT_AMBULATORY_CARE_PROVIDER_SITE_OTHER): Payer: Self-pay

## 2022-05-22 ENCOUNTER — Other Ambulatory Visit (HOSPITAL_COMMUNITY): Payer: Self-pay

## 2022-05-22 ENCOUNTER — Encounter (INDEPENDENT_AMBULATORY_CARE_PROVIDER_SITE_OTHER): Payer: Self-pay | Admitting: Family Medicine

## 2022-05-22 ENCOUNTER — Ambulatory Visit (INDEPENDENT_AMBULATORY_CARE_PROVIDER_SITE_OTHER): Payer: PRIVATE HEALTH INSURANCE | Admitting: Family Medicine

## 2022-05-22 VITALS — BP 139/82 | HR 89 | Temp 98.3°F | Ht 67.0 in | Wt >= 6400 oz

## 2022-05-22 DIAGNOSIS — Z6841 Body Mass Index (BMI) 40.0 and over, adult: Secondary | ICD-10-CM

## 2022-05-22 DIAGNOSIS — Z7985 Long-term (current) use of injectable non-insulin antidiabetic drugs: Secondary | ICD-10-CM

## 2022-05-22 DIAGNOSIS — R632 Polyphagia: Secondary | ICD-10-CM

## 2022-05-22 DIAGNOSIS — E559 Vitamin D deficiency, unspecified: Secondary | ICD-10-CM | POA: Diagnosis not present

## 2022-05-22 DIAGNOSIS — E669 Obesity, unspecified: Secondary | ICD-10-CM

## 2022-05-22 MED ORDER — VITAMIN D (ERGOCALCIFEROL) 1.25 MG (50000 UNIT) PO CAPS: 50000.0000 [IU] | ORAL_CAPSULE | ORAL | 0 refills | Status: DC

## 2022-05-22 MED ORDER — TOPIRAMATE 25 MG PO TABS: 25.0000 mg | ORAL_TABLET | Freq: Every day | ORAL | 0 refills | Status: DC

## 2022-05-22 MED ORDER — WEGOVY 0.5 MG/0.5ML ~~LOC~~ SOAJ
0.5000 mg | SUBCUTANEOUS | 0 refills | Status: DC
Start: 2022-05-22 — End: 2022-05-22

## 2022-05-22 MED ORDER — WEGOVY 0.5 MG/0.5ML ~~LOC~~ SOAJ
0.5000 mg | SUBCUTANEOUS | 0 refills | Status: DC
  Filled 2022-05-22 – 2022-05-23 (×4): qty 2, 28d supply, fill #0

## 2022-05-23 ENCOUNTER — Encounter (INDEPENDENT_AMBULATORY_CARE_PROVIDER_SITE_OTHER): Payer: Self-pay | Admitting: Family Medicine

## 2022-05-23 ENCOUNTER — Other Ambulatory Visit (HOSPITAL_COMMUNITY): Payer: Self-pay

## 2022-05-24 ENCOUNTER — Other Ambulatory Visit (HOSPITAL_COMMUNITY): Payer: Self-pay

## 2022-05-26 ENCOUNTER — Encounter (INDEPENDENT_AMBULATORY_CARE_PROVIDER_SITE_OTHER): Payer: Self-pay | Admitting: Family Medicine

## 2022-05-26 NOTE — Telephone Encounter (Signed)
Please check PA, Thanks

## 2022-05-27 ENCOUNTER — Telehealth (INDEPENDENT_AMBULATORY_CARE_PROVIDER_SITE_OTHER): Payer: Self-pay | Admitting: Family Medicine

## 2022-05-27 ENCOUNTER — Encounter (INDEPENDENT_AMBULATORY_CARE_PROVIDER_SITE_OTHER): Payer: Self-pay

## 2022-05-27 NOTE — Telephone Encounter (Signed)
Dr. Lawson Radar - Prior authorization denied for [facility]. Per insurance: Per your health plan's criteria, this drug is covered if you meet the following: You are able to tolerate a maintenance dose of 2.4mg  once weekly of this drug. Patient sent denial message via mychart.

## 2022-05-29 NOTE — Progress Notes (Signed)
Chief Complaint:   OBESITY Catherine [name] is here to discuss her progress with her obesity treatment plan along with follow-up of her obesity related diagnoses. Catherine [name] is on the Category 4 Plan and states she is following her eating plan approximately 85% of the time. Catherine [name] states she is walking 15 minutes 2 times per week.  Today's visit was #: 96 Starting weight: 468 lbs Starting date: 10/27/2016 Today's weight: 424 lbs Today's date: 05/22/2022 Total lbs lost to date: 44 lbs Total lbs lost since last in-office visit: 3  Interim History: Catherine [name] saw Ortho on 05/15/22 and is awaiting referral to physical therapy. Pain is slightly better. She has been baking a significant amount of meat as she can not tolerate grease. Eating lots of chicken breasts; sandwiches at lunch.  Subjective:   1. Vitamin D deficiency Catherine [name] is currently taking prescription Vit D 50,000 IU once a week. Denies any nausea, vomiting or muscle weakness. She notes fatigue.  2. Polyphagia Catherine [name] is currently taking Topiramate 25 mg nightly. Good control of symptoms.  Assessment/Plan:   1. Vitamin D deficiency We will refill Vit D 50,000 IU once a week for 1 month with 0 refills.  - Refill Vitamin D, Ergocalciferol, (DRISDOL) 1.25 MG (50000 UNIT) CAPS capsule; Take 1 capsule (50,000 Units total) by mouth every 7 (seven) days.  Dispense: 4 capsule; Refill: 0  2. Polyphagia We will refill Topamax 25 mg by mouth daily for 1 month with 0 refills.  -Refill topiramate (TOPAMAX) 25 MG tablet; Take 1 tablet (25 mg total) by mouth daily.  Dispense: 30 tablet; Refill: 0  3. Obesity with current BMI of 66.5 We will refill Wegovy 0.5 mg SubQ once weekly for 1 month with 0 refills.  -Refill Semaglutide-Weight Management (WEGOVY) 0.5 MG/0.5ML SOAJ; Inject 0.5 mg into the skin once a week.  Dispense: 2 mL; Refill: 0  Catherine [name] is currently in the action stage of change. As such, her goal is to continue with weight loss  efforts. She has agreed to the Category 4 Plan.   Exercise goals: All adults should avoid inactivity. Some physical activity is better than none, and adults who participate in any amount of physical activity gain some health benefits.  Behavioral modification strategies: increasing lean protein intake, meal planning and cooking strategies, keeping healthy foods in the home, and planning for success.  Catherine [name] has agreed to follow-up with our clinic in 4 weeks. She was informed of the importance of frequent follow-up visits to maximize her success with intensive lifestyle modifications for her multiple health conditions.   Objective:   Blood pressure 139/82, pulse 89, temperature 98.3 F (36.8 C), height 5\' 7"  (1.702 m), weight (!) 424 lb (192.3 kg), SpO2 100 %. Body mass index is 66.41 kg/m.  General: Cooperative, alert, well developed, in no acute distress. HEENT: Conjunctivae and lids unremarkable. Cardiovascular: Regular rhythm.  Lungs: Normal work of breathing. Neurologic: No focal deficits.   Lab Results  Component Value Date   CREATININE 0.91 06/20/2021   BUN 16 06/20/2021   NA 140 06/20/2021   K 4.5 06/20/2021   CL 100 06/20/2021   CO2 24 06/20/2021   Lab Results  Component Value Date   ALT 25 06/20/2021   AST 24 06/20/2021   ALKPHOS 61 06/20/2021   BILITOT 0.6 06/20/2021   Lab Results  Component Value Date   HGBA1C 5.5 01/13/2022   HGBA1C 5.3 06/20/2021   HGBA1C 5.4 09/11/2020   HGBA1C 5.4 03/06/2020   HGBA1C  5.5 11/22/2019   Lab Results  Component Value Date   INSULIN 39.8 (H) 01/13/2022   INSULIN 29.4 (H) 06/20/2021   INSULIN 43.4 (H) 09/11/2020   INSULIN 39.2 (H) 03/06/2020   INSULIN 32.5 (H) 11/22/2019   Lab Results  Component Value Date   TSH 2.670 01/13/2022   Lab Results  Component Value Date   CHOL 146 07/26/2019   HDL 46 07/26/2019   LDLCALC 90 07/26/2019   TRIG 45 07/26/2019   CHOLHDL 3.6 11/21/2015   Lab Results  Component Value Date    VD25OH 49.8 01/13/2022   VD25OH 38.6 06/20/2021   VD25OH 47.6 09/11/2020   Lab Results  Component Value Date   WBC 11.1 (H) 07/26/2019   HGB 12.9 07/26/2019   HCT 38.1 07/26/2019   MCV 88 07/26/2019   PLT 219 07/26/2019   Lab Results  Component Value Date   IRON 25 (L) 05/18/2017   TIBC 363 05/18/2017   FERRITIN 16 05/18/2017   Attestation Statements:   Reviewed by clinician on day of visit: allergies, medications, problem list, medical history, surgical history, family history, social history, and previous encounter notes.  I, Fortino Sic, RMA am acting as transcriptionist for Reuben Likes, MD.  I have reviewed the above documentation for accuracy and completeness, and I agree with the above. - Reuben Likes, MD

## 2022-05-31 ENCOUNTER — Other Ambulatory Visit: Payer: Self-pay | Admitting: Student

## 2022-06-09 ENCOUNTER — Ambulatory Visit: Payer: No Typology Code available for payment source | Attending: Family Medicine | Admitting: Physical Therapy

## 2022-06-09 ENCOUNTER — Other Ambulatory Visit: Payer: Self-pay

## 2022-06-09 ENCOUNTER — Encounter: Payer: Self-pay | Admitting: Physical Therapy

## 2022-06-09 DIAGNOSIS — M5459 Other low back pain: Secondary | ICD-10-CM | POA: Diagnosis present

## 2022-06-09 DIAGNOSIS — M6281 Muscle weakness (generalized): Secondary | ICD-10-CM | POA: Diagnosis present

## 2022-06-09 DIAGNOSIS — M546 Pain in thoracic spine: Secondary | ICD-10-CM | POA: Insufficient documentation

## 2022-06-09 NOTE — Patient Instructions (Signed)
Access Code: [email or web address] URL: [email or web address] Date: 06/09/2022 Prepared by: Rosana Hoes  Exercises - Seated Hip Abduction with Resistance  - 2 x daily - 2 sets - 10 reps - Seated Pelvic Tilt  - 2 x daily - 2 sets - 10 reps - Seated Scapular Retraction  - 2 x daily - 2 sets - 10 reps - Seated Hamstring Stretch  - 2 x daily - 3 reps - 20 seconds hold

## 2022-06-09 NOTE — Therapy (Unsigned)
OUTPATIENT PHYSICAL THERAPY EVALUATION   Patient Name: Catherine [name] MRN: [id number] DOB:1981-05-17, 41 y.o., female Today's Date: 06/09/2022   PT End of Session - 06/09/22 1729     Visit Number 1    Number of Visits 9    Date for PT Re-Evaluation 08/04/22    Authorization Type [employer]    PT Start Time 1701    PT Stop Time 1745    PT Time Calculation (min) 44 min    Activity Tolerance Patient tolerated treatment well    Behavior During Therapy [facility] for tasks assessed/performed             Past Medical History:  Diagnosis Date   Allergy    Anxiety    Arthritis    Asthma    since baby, seasonal   ASTHMA, UNSPECIFIED 12/10/2006   Qualifier: Diagnosis of  By: Abundio Miu     CELLULITIS AND ABSCESS OF LEG EXCEPT FOOT 12/30/2007   Qualifier: Diagnosis of  By: Daphine Deutscher FNP, [place]     Cyst of bone    right side of spine   DEPENDENT EDEMA, LEGS, BILATERAL 11/22/2008   Qualifier: Diagnosis of  By: Barbaraann Barthel MD, [place]     Depression 2003Dx   Edema    Food allergy    citris   FOOT PAIN, RIGHT 11/22/2008   Qualifier: Diagnosis of  By: Barbaraann Barthel MD, [place]     Gastric ulcer    GERD (gastroesophageal reflux disease)    Gout    Hypertension    took Norvasc for 3 months, doctor discontinued no longer on BP medications   Joint pain    KNEE INJURY, LEFT 01/01/2009   Qualifier: Diagnosis of  By: Delrae Alfred MD, [place]     Migraine    ONYCHOMYCOSIS, TOENAILS 11/22/2008   Qualifier: Diagnosis of  By: Barbaraann Barthel MD, [place]     Shortness of breath    Sleep apnea    TINEA PEDIS 11/22/2008   Qualifier: Diagnosis of  By: Barbaraann Barthel MD, Benetta Spar     Past Surgical History:  Procedure Laterality Date   DILITATION & CURRETTAGE/HYSTROSCOPY WITH NOVASURE ABLATION N/A 07/03/2017   Procedure: DILATATION & CURETTAGE/HYSTEROSCOPY WITH NOVASURE ABLATION;  Surgeon: Genia Del, MD;  Location: [facility];  Service: Gynecology;  Laterality: N/A;   UPPER GI ENDOSCOPY     WISDOM TOOTH  EXTRACTION  2008   x4   Patient Active Problem List   Diagnosis Date Noted   Thoracic back pain 04/08/2022   SOBOE (shortness of breath on exertion) 03/10/2022   At risk for activity intolerance 03/10/2022   Skin lesion 02/28/2022   Genetic testing 02/26/2022   At high risk for breast cancer 02/26/2022   PCOS (polycystic ovarian syndrome) 02/07/2022   Abnormal mammogram 02/07/2022   Family history of breast cancer 02/07/2022   Family history of pancreatic cancer 02/07/2022   Family history of prostate cancer 02/07/2022   Chronic pain syndrome 07/26/2021   Polyphagia 01/23/2021   Bursitis 01/01/2021   Pain in both lower extremities 11/16/2020   Peroneal neuropathy 11/16/2020   Acute otitis externa of both ears 03/02/2020   Class 3 severe obesity with serious comorbidity and body mass index (BMI) greater than or equal to 70 in adult [facility]) 06/10/2017   Fluid retention 06/10/2017   Menorrhagia 05/18/2017   Sacral radiculopathy 02/04/2017   Insulin resistance 02/02/2017   At risk for violence to self related to depression 02/02/2017   Morbid obesity (HCC) 01/01/2017   Sore throat (viral)  11/21/2016   Vitamin D deficiency 11/12/2016   Asthma 07/25/2016   Abdominal pain 06/04/2016   Otitis, externa, infective 06/04/2016   Dysuria 03/20/2016   Gout 11/21/2015   Obesity, morbid, BMI 50 or higher (HCC) 05/14/2015   BIPOLAR DISORDER UNSPECIFIED 11/22/2008   Depression 12/10/2006   MIGRAINE, UNSPEC., W/O INTRACTABLE MIGRAINE 12/10/2006   GASTROESOPHAGEAL REFLUX, NO ESOPHAGITIS 12/10/2006   OSA (obstructive sleep apnea) 12/10/2006    PCP: Bess Kinds, MD  REFERRING PROVIDER: Naida Sleight, PA-C  REFERRING DIAG: Other spondylosis with radiculopathy, lumbar region  Rationale for Evaluation and Treatment Rehabilitation  THERAPY DIAG:  Other low back pain  Pain in thoracic spine  Muscle weakness (generalized)  ONSET DATE: March 30, 2022   SUBJECTIVE:           SUBJECTIVE STATEMENT: Patient reports injury on June 18th, 2023 at a hotel out of town. The injury aggravated her lower back pain causing spasms, then the upper back and top of her shoulder feel like a burning, pulling sensation. The pain has gotten a little bit better since the initial injury. She also notes a sharp pain every once in a while on the right side. The lower back spasms are pretty constant, then will feel pain at the top of the back with more activity throughout the day. She notes that she is limited in how long she can stand, and it affects her walking ability. She is only able to stand for about 5 minutes before needing to sit due to pain.  PERTINENT HISTORY:  BMI, depression, history of bipolar, chronic pain  PAIN:  Are you having pain? Yes:  NPRS scale: 7/10 (lower), 4-5/10 (upper) Pain location: Lower back, upper back Pain description: Spasms, burning, aching, pulling, sharp Aggravating factors: Standing, walking Relieving factors: Hot shower, heat pack, rest, medication (muscle relaxer)  PRECAUTIONS: None  WEIGHT BEARING RESTRICTIONS No  FALLS:  Has patient fallen in last 6 months? No  LIVING ENVIRONMENT: Lives with: lives with their family  OCCUPATION: Works from home, sitting majority of time  PLOF: Independent  PATIENT GOALS: Pain relief, improve standing and walking   OBJECTIVE:  DIAGNOSTIC FINDINGS:  Lumbar MRI 04/08/2017 IMPRESSION: L4-5: Bilateral facet arthropathy without stenosis. This could be a cause of back pain or referred facet syndrome pain. Mild disc desiccation without bulge or herniation. L5-S1: Bilateral facet arthropathy. 14 mm synovial cyst projecting inward from the facet on the right, compressing the right S1 nerve.  PATIENT SURVEYS:  FOTO 38% functional status  SCREENING FOR RED FLAGS: Negative  COGNITION: Overall cognitive status: Within functional limits for tasks assessed     SENSATION: WFL  MUSCLE LENGTH: Hamstring  and hip flexor deficits  POSTURE:   Rounded shoulder posture  PALPATION: Tender to palpation bilateral (R>L) lumbar paraspinals, bilateral upper trap and parascapular region  LUMBAR ROM:    Active A/PROM  eval  Flexion 50%  Extension Unable  Right lateral flexion 25%  Left lateral flexion 25%  Right rotation 25%  Left rotation 25%    All lumbar AROM limited secondary to pain  LOWER EXTREMITY ROM:     Not assessed  LOWER EXTREMITY MMT:    MMT Right eval Left eval  Hip flexion 4- 4-  Hip extension - -  Hip abduction 3- 3-  Knee flexion 4 4  Knee extension 4 4   LUMBAR SPECIAL TESTS:  Radicular testing inconclusive  FUNCTIONAL TESTS:  Patient exhibits difficulty with performing sit to stand, she requires BUE assist  to stand and with hand climbing up legs to achieve upright position  GAIT: Distance walked: 50 ft Assistive device utilized: None Level of assistance: Complete Independence Comments: decreased gait speed, antalgic on right, coxalgic gait with stance, bilateral toe out   TODAY'S TREATMENT  Trial supine SLR and PPT but patient unable to tolerate supine positioning Seated pelvic tilt x 10 Seated clamshell with green x 10 Seated shoulder blade squeezes x 10 Seated hamstring stretch 2 x 20 sec   PATIENT EDUCATION:  Education details: Exam findings, POC, HEP Person educated: Patient Education method: Explanation, Demonstration, Tactile cues, Verbal cues, and Handouts Education comprehension: verbalized understanding, returned demonstration, verbal cues required, tactile cues required, and needs further education  HOME EXERCISE PROGRAM: Access Code: [email or web address]   ASSESSMENT: CLINICAL IMPRESSION: Patient is a 41 y.o. female who was seen today for physical therapy evaluation and treatment for subacute on chronic mid-lower back pain. She exhibits limitations in all planes of lumbar motion with increased pain, generalized strength deficits, limitations in  mobility and functional movements including sit to stands, decreased tolerance for all standing and walking. Unclear whether she has a radicular component to her symptoms, it seems more like recent exacerbation of non-specific low back and upper back pain. Comorbidities including BMI is likely playing a roll in persistent pain and limitation in mobility, and limited the evaluation due to difficulty remaining in supine positioning. Patient provided exercises consisting of seated position for ability to perform at home and she was encouraged to change positions or postures frequently throughout the day.    OBJECTIVE IMPAIRMENTS decreased activity tolerance, difficulty walking, decreased ROM, decreased strength, increased muscle spasms, impaired flexibility, improper body mechanics, postural dysfunction, obesity, and pain.   ACTIVITY LIMITATIONS lifting, bending, sitting, standing, squatting, sleeping, transfers, bed mobility, bathing, hygiene/grooming, and locomotion level  PARTICIPATION LIMITATIONS: meal prep, cleaning, laundry, driving, shopping, community activity, and occupation  PERSONAL FACTORS Fitness, Past/current experiences, Time since onset of injury/illness/exacerbation, and 3+ comorbidities: see above  are also affecting patient's functional outcome.   REHAB POTENTIAL: Fair  CLINICAL DECISION MAKING: Stable/uncomplicated  EVALUATION COMPLEXITY: Low   GOALS: Goals reviewed with patient? Yes  SHORT TERM GOALS: Target date: 07/07/2022  Patient will be I with initial HEP in order to progress with therapy. Baseline: HEP provided at eval Goal status: INITIAL  2.  Patient will report constant low back pain level </= 4/10 in order to reduce functional limitations. Baseline: low back pain 7/10 Goal status: INITIAL  3.  Patient will be able to stand >/= 10 minutes before needing rest break due to back pain to improve ability to perform household tasks Baseline: Patient reports ability  to stand about 5 minutes before need to sit due to pain Goal status: INITIAL  LONG TERM GOALS: Target date: 08/04/2022  Patient will be I with final HEP to maintain progress from PT. Baseline: HEP provided at eval Goal status: INITIAL  2.  Patient will report >/= 56% status on FOTO to indicate improved functional ability. Baseline: 38% functional status Goal status: INITIAL  3.  Patient will demonstrate >/= 25% improvement in all planes of lumbar AROM in order to improve ability to perform dressing and self care tasks with limitation. Baseline: patient exhibits limitation in all lumbar AROM (see above) Goal status: INITIAL  4.  Patient will exhibit gross strength of core and hips >/= 4-/5 MMT in order to improve standing and walking tolerance Baseline: grossly 3-/5 MMT Goal status: INITIAL  5.  Patient will  report back pain level </= 3/10 with all activity and walking to improve community access. Baseline: low back pain 7/10, upper back 4-5/10 Goal status: INITIAL   PLAN: PT FREQUENCY: 1x/week  PT DURATION: 8 weeks  PLANNED INTERVENTIONS: Therapeutic exercises, Therapeutic activity, Neuromuscular re-education, Balance training, Gait training, Patient/Family education, Self Care, Joint mobilization, Joint manipulation, Aquatic Therapy, Dry Needling, Electrical stimulation, Spinal manipulation, Spinal mobilization, Cryotherapy, Moist heat, Manual therapy, and Re-evaluation.  PLAN FOR NEXT SESSION: Review HEP and progress PRN, trial dry needling/stretching for lumbar spine/upper back and neck region PRN, core activation and postural exercises, progress to standing hip, core, and postural strengthening as able   Rosana Hoes, PT, DPT, LAT, ATC 06/10/22  9:59 AM Phone: [phone] Fax: [phone]

## 2022-06-10 ENCOUNTER — Encounter: Payer: Self-pay | Admitting: Physical Therapy

## 2022-06-13 ENCOUNTER — Ambulatory Visit (HOSPITAL_BASED_OUTPATIENT_CLINIC_OR_DEPARTMENT_OTHER): Payer: PRIVATE HEALTH INSURANCE | Attending: Primary Care | Admitting: Pulmonary Disease

## 2022-06-13 ENCOUNTER — Other Ambulatory Visit (INDEPENDENT_AMBULATORY_CARE_PROVIDER_SITE_OTHER): Payer: Self-pay | Admitting: Family Medicine

## 2022-06-13 ENCOUNTER — Other Ambulatory Visit: Payer: Self-pay

## 2022-06-13 DIAGNOSIS — G4733 Obstructive sleep apnea (adult) (pediatric): Secondary | ICD-10-CM | POA: Insufficient documentation

## 2022-06-13 DIAGNOSIS — R632 Polyphagia: Secondary | ICD-10-CM

## 2022-06-19 ENCOUNTER — Telehealth: Payer: Self-pay | Admitting: Pulmonary Disease

## 2022-06-19 DIAGNOSIS — G4733 Obstructive sleep apnea (adult) (pediatric): Secondary | ICD-10-CM | POA: Diagnosis not present

## 2022-06-19 NOTE — Procedures (Signed)
Patient Name: Catherine [name], Catherine [name] Date: 06/13/2022 Gender: Female D.O.B: [birth year] Age (years): 41 Referring Provider: Ames Dura NP Height (inches): 67 Interpreting Physician: Cyril Mourning MD, ABSM Weight (lbs): 425 RPSGT: Cherylann Parr BMI: 67 MRN: [id number] Neck Size: 16.00 <br> <br> CLINICAL INFORMATION Sleep Study Type: NPSG    Indication for sleep study: Fatigue, Obesity, reassessment of OSA.  NPSG 2014 >> AHI 21/hr, titrated CPAP pressure 9cm h20.     Epworth Sleepiness Score: 11    SLEEP STUDY TECHNIQUE As per the AASM Manual for the Scoring of Sleep and Associated Events v2.3 (April 2016) with a hypopnea requiring 4% desaturations.  The channels recorded and monitored were frontal, central and occipital EEG, electrooculogram (EOG), submentalis EMG (chin), nasal and oral airflow, thoracic and abdominal wall motion, anterior tibialis EMG, snore microphone, electrocardiogram, and pulse oximetry.  MEDICATIONS Medications self-administered by patient taken the night of the study : N/A  SLEEP ARCHITECTURE The study was initiated at 10:49:14 PM and ended at 5:23:33 AM.  Sleep onset time was 21.4 minutes and the sleep efficiency was 78.0%%. The total sleep time was 307.4 minutes.  Stage REM latency was 67.0 minutes.  The patient spent 2.1%% of the night in stage N1 sleep, 69.6%% in stage N2 sleep, 0.3%% in stage N3 and 28% in REM.  Alpha intrusion was absent.  Supine sleep was 39.83%.  RESPIRATORY PARAMETERS The overall apnea/hypopnea index (AHI) was 40.6 per hour. There were 11 total apneas, including 11 obstructive, 0 central and 0 mixed apneas. There were 197 hypopneas and 0 RERAs.  The AHI during Stage REM sleep was 87.9 per hour.  AHI while supine was 69.6 per hour.  The mean oxygen saturation was 93.4%. The minimum SpO2 during sleep was 77.0%.  loud snoring was noted during this study.  CARDIAC DATA The 2 lead EKG demonstrated sinus rhythm. The  mean heart rate was 85.7 beats per minute. Other EKG findings include: None.   LEG MOVEMENT DATA The total PLMS were 0 with a resulting PLMS index of 0.0. Associated arousal with leg movement index was 0.0 .  IMPRESSIONS - Severe obstructive sleep apnea occurred during this study (AHI = 40.6/h). Events were severe during REM sleep - Moderate oxygen desaturation was noted during this study (Min O2 = 77.0%). - The patient snored with loud snoring volume. - No cardiac abnormalities were noted during this study. - Clinically significant periodic limb movements did not occur during sleep. No significant associated arousals.   DIAGNOSIS - Obstructive Sleep Apnea (G47.33) - Nocturnal Hypoxemia (G47.36)   RECOMMENDATIONS - Therapeutic CPAP titration to determine optimal pressure required to alleviate sleep disordered breathing. Alternatively, autoCPAP 5-15 cm can be tried - Avoid alcohol, sedatives and other CNS depressants that may worsen sleep apnea and disrupt normal sleep architecture. - Sleep hygiene should be reviewed to assess factors that may improve sleep quality. - Weight management and regular exercise should be initiated or continued if appropriate.   Cyril Mourning MD Board Certified in Sleep medicine

## 2022-06-19 NOTE — Telephone Encounter (Signed)
Patient inquiring about sleep study results from 9/1.  Please call at [phone].

## 2022-06-20 NOTE — Telephone Encounter (Signed)
Sleep study on 06/13/22 showed severe obstructive sleep apnea.  Patient needs to be set up with virtual or in office visit to review results in more detail and discuss treatment options.

## 2022-06-20 NOTE — Telephone Encounter (Signed)
Attempted to call pt but unable to reach. Left message for her to return call. 

## 2022-06-22 ENCOUNTER — Other Ambulatory Visit (INDEPENDENT_AMBULATORY_CARE_PROVIDER_SITE_OTHER): Payer: Self-pay | Admitting: Family Medicine

## 2022-06-22 DIAGNOSIS — F3289 Other specified depressive episodes: Secondary | ICD-10-CM

## 2022-06-23 ENCOUNTER — Ambulatory Visit (INDEPENDENT_AMBULATORY_CARE_PROVIDER_SITE_OTHER): Payer: PRIVATE HEALTH INSURANCE | Admitting: Family Medicine

## 2022-06-23 ENCOUNTER — Encounter (INDEPENDENT_AMBULATORY_CARE_PROVIDER_SITE_OTHER): Payer: Self-pay | Admitting: Family Medicine

## 2022-06-23 ENCOUNTER — Other Ambulatory Visit (HOSPITAL_COMMUNITY): Payer: Self-pay

## 2022-06-23 VITALS — BP 132/79 | HR 94 | Temp 97.9°F | Ht 67.0 in | Wt >= 6400 oz

## 2022-06-23 DIAGNOSIS — R632 Polyphagia: Secondary | ICD-10-CM | POA: Diagnosis not present

## 2022-06-23 DIAGNOSIS — F3289 Other specified depressive episodes: Secondary | ICD-10-CM

## 2022-06-23 DIAGNOSIS — E669 Obesity, unspecified: Secondary | ICD-10-CM | POA: Diagnosis not present

## 2022-06-23 DIAGNOSIS — Z6841 Body Mass Index (BMI) 40.0 and over, adult: Secondary | ICD-10-CM

## 2022-06-23 DIAGNOSIS — E559 Vitamin D deficiency, unspecified: Secondary | ICD-10-CM | POA: Diagnosis not present

## 2022-06-23 MED ORDER — VITAMIN D (ERGOCALCIFEROL) 1.25 MG (50000 UNIT) PO CAPS: 50000.0000 [IU] | ORAL_CAPSULE | ORAL | 0 refills | Status: DC

## 2022-06-23 MED ORDER — TOPIRAMATE 25 MG PO TABS: 25.0000 mg | ORAL_TABLET | Freq: Every day | ORAL | 0 refills | Status: DC

## 2022-06-23 MED ORDER — BUPROPION HCL ER (SR) 150 MG PO TB12: 150.0000 mg | ORAL_TABLET | Freq: Two times a day (BID) | ORAL | 0 refills | Status: DC

## 2022-06-23 MED ORDER — WEGOVY 1 MG/0.5ML ~~LOC~~ SOAJ
1.0000 mg | SUBCUTANEOUS | 0 refills | Status: DC
  Filled 2022-06-23: qty 2, 28d supply, fill #0

## 2022-06-23 NOTE — Therapy (Signed)
OUTPATIENT PHYSICAL THERAPY TREATMENT NOTE   Patient Name: Catherine [name] MRN: [id number] DOB:[birth year], 41 y.o., female Today's Date: 06/24/2022  PCP: Bess Kinds, MD   REFERRING PROVIDER: Naida Sleight, PA-C   END OF SESSION:   PT End of Session - 06/24/22 1654     Visit Number 2    Number of Visits 9    Date for PT Re-Evaluation 08/04/22    Authorization Type [employer]    PT Start Time 1616    PT Stop Time 1654    PT Time Calculation (min) 38 min    Activity Tolerance Patient tolerated treatment well    Behavior During Therapy WFL for tasks assessed/performed             Past Medical History:  Diagnosis Date   Allergy    Anxiety    Arthritis    Asthma    since baby, seasonal   ASTHMA, UNSPECIFIED 12/10/2006   Qualifier: Diagnosis of  By: Abundio Miu     CELLULITIS AND ABSCESS OF LEG EXCEPT FOOT 12/30/2007   Qualifier: Diagnosis of  By: Daphine Deutscher FNP, [place]     Cyst of bone    right side of spine   DEPENDENT EDEMA, LEGS, BILATERAL 11/22/2008   Qualifier: Diagnosis of  By: Barbaraann Barthel MD, [place]     Depression 2003Dx   Edema    Food allergy    citris   FOOT PAIN, RIGHT 11/22/2008   Qualifier: Diagnosis of  By: Barbaraann Barthel MD, [place]     Gastric ulcer    GERD (gastroesophageal reflux disease)    Gout    Hypertension    took Norvasc for 3 months, doctor discontinued no longer on BP medications   Joint pain    KNEE INJURY, LEFT 01/01/2009   Qualifier: Diagnosis of  By: Delrae Alfred MD, [place]     Migraine    ONYCHOMYCOSIS, TOENAILS 11/22/2008   Qualifier: Diagnosis of  By: Barbaraann Barthel MD, [place]     Shortness of breath    Sleep apnea    TINEA PEDIS 11/22/2008   Qualifier: Diagnosis of  By: Barbaraann Barthel MD, Benetta Spar     Past Surgical History:  Procedure Laterality Date   DILITATION & CURRETTAGE/HYSTROSCOPY WITH NOVASURE ABLATION N/A 07/03/2017   Procedure: DILATATION & CURETTAGE/HYSTEROSCOPY WITH NOVASURE ABLATION;  Surgeon: Genia Del, MD;   Location: [facility];  Service: Gynecology;  Laterality: N/A;   UPPER GI ENDOSCOPY     WISDOM TOOTH EXTRACTION  2008   x4   Patient Active Problem List   Diagnosis Date Noted   Thoracic back pain 04/08/2022   SOBOE (shortness of breath on exertion) 03/10/2022   At risk for activity intolerance 03/10/2022   Skin lesion 02/28/2022   Genetic testing 02/26/2022   At high risk for breast cancer 02/26/2022   PCOS (polycystic ovarian syndrome) 02/07/2022   Abnormal mammogram 02/07/2022   Family history of breast cancer 02/07/2022   Family history of pancreatic cancer 02/07/2022   Family history of prostate cancer 02/07/2022   Chronic pain syndrome 07/26/2021   Polyphagia 01/23/2021   Bursitis 01/01/2021   Pain in both lower extremities 11/16/2020   Peroneal neuropathy 11/16/2020   Acute otitis externa of both ears 03/02/2020   Class 3 severe obesity with serious comorbidity and body mass index (BMI) greater than or equal to 70 in adult [facility]) 06/10/2017   Fluid retention 06/10/2017   Menorrhagia 05/18/2017   Sacral radiculopathy 02/04/2017   Insulin resistance 02/02/2017   At risk  for violence to self related to depression 02/02/2017   Morbid obesity (HCC) 01/01/2017   Sore throat (viral) 11/21/2016   Vitamin D deficiency 11/12/2016   Asthma 07/25/2016   Abdominal pain 06/04/2016   Otitis, externa, infective 06/04/2016   Dysuria 03/20/2016   Gout 11/21/2015   Obesity, morbid, BMI 50 or higher (HCC) 05/14/2015   BIPOLAR DISORDER UNSPECIFIED 11/22/2008   Depression 12/10/2006   MIGRAINE, UNSPEC., W/O INTRACTABLE MIGRAINE 12/10/2006   GASTROESOPHAGEAL REFLUX, NO ESOPHAGITIS 12/10/2006   OSA (obstructive sleep apnea) 12/10/2006    REFERRING DIAG: Other spondylosis with radiculopathy, lumbar region  THERAPY DIAG:  Other low back pain  Pain in thoracic spine  Muscle weakness (generalized)  Rationale for Evaluation and Treatment Rehabilitation  PERTINENT HISTORY: BMI,  depression, history of bipolar, chronic pain  PRECAUTIONS: None   SUBJECTIVE: Patient reports she is doing alright. States just a little but of pain from the right shoulder blade to the bottom of the back.   PAIN:  Are you having pain? Yes:  NPRS scale: 5/10 (lower), 4-5/10 (upper) Pain location: Lower back, upper back Pain description: Spasms, burning, aching, pulling, sharp Aggravating factors: Standing, walking Relieving factors: Hot shower, heat pack, rest, medication (muscle relaxer)  PATIENT GOALS: Pain relief, improve standing and walking   OBJECTIVE: (objective measures completed at initial evaluation unless otherwise dated) PATIENT SURVEYS:  FOTO 38% functional status   MUSCLE LENGTH: Hamstring and hip flexor deficits   POSTURE:            Rounded shoulder posture   PALPATION: Tender to palpation bilateral (R>L) lumbar paraspinals, bilateral upper trap and parascapular region   LUMBAR ROM:     Active A/PROM  eval  Flexion 50%  Extension Unable  Right lateral flexion 25%  Left lateral flexion 25%  Right rotation 25%  Left rotation 25%                        All lumbar AROM limited secondary to pain   LOWER EXTREMITY MMT:     MMT Right eval Left eval  Hip flexion 4- 4-  Hip extension - -  Hip abduction 3- 3-  Knee flexion 4 4  Knee extension 4 4    LUMBAR SPECIAL TESTS:  Radicular testing inconclusive   FUNCTIONAL TESTS:  Patient exhibits difficulty with performing sit to stand, she requires BUE assist to stand and with hand climbing up legs to achieve upright position   GAIT: Distance walked: 50 ft Assistive device utilized: None Level of assistance: Complete Independence Comments: decreased gait speed, antalgic on right, coxalgic gait with stance, bilateral toe out     TODAY'S TREATMENT  [facility] Adult PT Treatment:                                                DATE: 06/24/2022 Therapeutic Exercise: Seated pelvic tilt x 10 Seated shoulder  blade squeezes x 10 Seated shoulder horizontal abduction with yellow 2 x 10 Seated upper trap and levator stretch 2 x 20 sec each Seated physioball roll out stretch 5 x 10 sec Standing row with green 2 x 10 Standing shoulder extension with blue 2 x 10 Seated hamstring stretch 2 x 20 sec each Seated clamshell with blue 2 x 10   [facility] Adult PT Treatment:  DATE: 06/09/2022 Therapeutic Exercise: Trial supine SLR and PPT but patient unable to tolerate supine positioning Seated pelvic tilt x 10 Seated clamshell with green x 10 Seated shoulder blade squeezes x 10 Seated hamstring stretch 2 x 20 sec   PATIENT EDUCATION:  Education details: HEP update Person educated: Patient Education method: Explanation, Demonstration, Tactile cues, Verbal cues, Handout Education comprehension: verbalized understanding, returned demonstration, verbal cues required, tactile cues required, and needs further education   HOME EXERCISE PROGRAM: Access Code: [email or web address]     ASSESSMENT: CLINICAL IMPRESSION: Patient tolerated therapy well with no adverse effects. Therapy focused on progression of postural and core strengthening. She was able to perform standing exercises but did require occasional rest breaks due to back pain. She reports overall improvement in her symptoms but did not her back felt a little aggravated post therapy. She requires cueing for postural control and exercise technique throughout therapy. Updated HEP with addition of banded exercises. Patient would benefit from continued skilled PT to progress her mobility and strength in order to reduce pain and maximize functional ability.     OBJECTIVE IMPAIRMENTS decreased activity tolerance, difficulty walking, decreased ROM, decreased strength, increased muscle spasms, impaired flexibility, improper body mechanics, postural dysfunction, obesity, and pain.    ACTIVITY LIMITATIONS lifting, bending, sitting,  standing, squatting, sleeping, transfers, bed mobility, bathing, hygiene/grooming, and locomotion level   PARTICIPATION LIMITATIONS: meal prep, cleaning, laundry, driving, shopping, community activity, and occupation   PERSONAL FACTORS Fitness, Past/current experiences, Time since onset of injury/illness/exacerbation, and 3+ comorbidities: see above  are also affecting patient's functional outcome.      GOALS: Goals reviewed with patient? Yes   SHORT TERM GOALS: Target date: 07/07/2022   Patient will be I with initial HEP in order to progress with therapy. Baseline: HEP provided at eval Goal status: INITIAL   2.  Patient will report constant low back pain level </= 4/10 in order to reduce functional limitations. Baseline: low back pain 7/10 Goal status: INITIAL   3.  Patient will be able to stand >/= 10 minutes before needing rest break due to back pain to improve ability to perform household tasks Baseline: Patient reports ability to stand about 5 minutes before need to sit due to pain Goal status: INITIAL   LONG TERM GOALS: Target date: 08/04/2022   Patient will be I with final HEP to maintain progress from PT. Baseline: HEP provided at eval Goal status: INITIAL   2.  Patient will report >/= 56% status on FOTO to indicate improved functional ability. Baseline: 38% functional status Goal status: INITIAL   3.  Patient will demonstrate >/= 25% improvement in all planes of lumbar AROM in order to improve ability to perform dressing and self care tasks with limitation. Baseline: patient exhibits limitation in all lumbar AROM (see above) Goal status: INITIAL   4.  Patient will exhibit gross strength of core and hips >/= 4-/5 MMT in order to improve standing and walking tolerance Baseline: grossly 3-/5 MMT Goal status: INITIAL   5.  Patient will report back pain level </= 3/10 with all activity and walking to improve community access. Baseline: low back pain 7/10, upper back  4-5/10 Goal status: INITIAL     PLAN: PT FREQUENCY: 1x/week   PT DURATION: 8 weeks   PLANNED INTERVENTIONS: Therapeutic exercises, Therapeutic activity, Neuromuscular re-education, Balance training, Gait training, Patient/Family education, Self Care, Joint mobilization, Joint manipulation, Aquatic Therapy, Dry Needling, Electrical stimulation, Spinal manipulation, Spinal mobilization, Cryotherapy, Moist heat, Manual therapy, and  Re-evaluation.   PLAN FOR NEXT SESSION: Review HEP and progress PRN, trial dry needling/stretching for lumbar spine/upper back and neck region PRN, core activation and postural exercises, progress to standing hip, core, and postural strengthening as able    Rosana Hoes, PT, DPT, LAT, ATC 06/24/22  4:55 PM Phone: [phone] Fax: [phone]

## 2022-06-24 ENCOUNTER — Encounter: Payer: Self-pay | Admitting: Physical Therapy

## 2022-06-24 ENCOUNTER — Ambulatory Visit: Payer: No Typology Code available for payment source | Attending: Family Medicine | Admitting: Physical Therapy

## 2022-06-24 ENCOUNTER — Other Ambulatory Visit: Payer: Self-pay

## 2022-06-24 DIAGNOSIS — M546 Pain in thoracic spine: Secondary | ICD-10-CM | POA: Diagnosis present

## 2022-06-24 DIAGNOSIS — M6281 Muscle weakness (generalized): Secondary | ICD-10-CM | POA: Insufficient documentation

## 2022-06-24 DIAGNOSIS — M5459 Other low back pain: Secondary | ICD-10-CM | POA: Insufficient documentation

## 2022-06-24 NOTE — Patient Instructions (Signed)
Access Code: [email or web address] URL: [email or web address] Date: 06/24/2022 Prepared by: Rosana Hoes  Exercises - Seated Hip Abduction with Resistance  - 2 x daily - 2 sets - 10 reps - Seated Pelvic Tilt  - 2 x daily - 2 sets - 10 reps - Seated Scapular Retraction  - 2 x daily - 2 sets - 10 reps - Seated Hamstring Stretch  - 2 x daily - 3 reps - 20 seconds hold - Seated Shoulder Horizontal Abduction with Resistance - Palms Down  - 2 x daily - 2 sets - 10 reps

## 2022-06-25 ENCOUNTER — Telehealth (INDEPENDENT_AMBULATORY_CARE_PROVIDER_SITE_OTHER): Payer: No Typology Code available for payment source | Admitting: Primary Care

## 2022-06-25 DIAGNOSIS — G4733 Obstructive sleep apnea (adult) (pediatric): Secondary | ICD-10-CM | POA: Diagnosis not present

## 2022-06-25 NOTE — Progress Notes (Signed)
Virtual Visit via Video Note  I connected with Catherine [name] on 06/25/22 at 12:00 PM EDT by a video enabled telemedicine application and verified that I am speaking with the correct person using two identifiers.  Location: Patient: Home Provider: Office    I discussed the limitations of evaluation and management by telemedicine and the availability of in person appointments. The patient expressed understanding and agreed to proceed.  History of Present Illness: 41 year old female, former smoker quit in 2016 (9-pack-year history).  Past medical history significant for OSA, asthma, migraine headaches, GERD, PCOS, insulin resistance, obesity.  Previous [facility] pulmonary encounter:  03/18/2022 Patient presents today for overdue follow-up sleep apnea. She has been off CPAP for several weeks. She lost the humidification chamber to her current CAP machine and has been unable to use since then. Since being off CPAP she reports more daytime sleepiness and headaches. She had an episode gasping for air several nights ago. Up until recently she reports moderate compliance with CPAP use, she would wear her mask on average 4-5 nights a week. She believes her pressure setting is either 9 or 11cm h20. She is working on weight loss efforts, she is on [place] and dose recently increased.   Sleep questionnaire Symptoms- Gasping for air, daytime sleepiness and headache  Prior sleep study- Moderate OSA on sleep study 06/05/2013 Bedtime- 7:30-8pm Time to fall asleep- 15-64mins  Nocturnal awakenings- 1-2 times Out of bed/start of day- 6:30am Weight changes- +/- 30 lbs Do you operate heavy machinery- No Do you currently wear CPAP- Yes, 9 or 11cm h20 (recently lost part to CPAP and has been unable to use) Do you current wear oxygen- No [place]- 16  06/25/2022- Interim hx  Patient contacted today to review sleep study results.  Patient had PSG on 06/13/2022 that showed evidence of severe obstructive sleep apnea, average  AHI 40.6/h.  AHI while supine was 69.6/h.  Minimal SpO2 77% (average oxygen 93%). Patient had loud snoring.  Reviewed sleep study results with patient.  She was on CPAP but was unable to use due to missing parts. She will need an order for new CPAP machine.   Observations/Objective:  - Appears well; No overt shortness of breath, wheezing or cough  Assessment and Plan:  OSA: - Hx OSA, she had a break in CPAP therapy d/t broke machine - NPSG 06/13/22 showed severe OSA, AHI 40.6/hour - DME order placed for new CPAP machine auto titrate 5 to 15 cm H2O - Advised patient aim to wear CPAP every night for min 4-6 hours or longer - Encourage weight loss efforts and side sleeping position   Follow Up Instructions:   31-90 days after getting new CPAP machine for compliance check  I discussed the assessment and treatment plan with the patient. The patient was provided an opportunity to ask questions and all were answered. The patient agreed with the plan and demonstrated an understanding of the instructions.   The patient was advised to call back or seek an in-person evaluation if the symptoms worsen or if the condition fails to improve as anticipated.  I provided 22 minutes of non-face-to-face time during this encounter.   Glenford Bayley, NP

## 2022-06-25 NOTE — Telephone Encounter (Signed)
Pt had a video visit with BW today 9/13. Nothing further needed.

## 2022-06-25 NOTE — Progress Notes (Signed)
Chief Complaint:   OBESITY Catherine [name] is here to discuss her progress with her obesity treatment plan along with follow-up of her obesity related diagnoses. Catherine [name] is on the Category 4 Plan and states she is following her eating plan approximately 75% of the time. Catherine [name] states she is walking/physical therapy 30/60 minutes 2/1 times per week.  Today's visit was #: 97 Starting weight: 468 lbs Starting date: 10/27/2016 Today's weight: 432 lbs Today's date: 06/23/2022 Total lbs lost to date: 36 lbs Total lbs lost since last in-office visit: 0  Interim History: Catherine [name] had sleep study done, started physical therapy and is experiencing extra cravings. Sleep study showed sever obstructive sleep apnea and has follow up appointment in next few weeks.  Subjective:   1. Vitamin D deficiency Catherine [name] is currently taking prescription Vit D 50,000 IU once a week. Denies any nausea, vomiting or muscle weakness. She notes fatigue.  2. Polyphagia Catherine [name] is on Topiramate with no side effects noted. Still noticing increase in cravings and drive to eat in evenings.  3. Other depression, with emotional eating Catherine [name] is on Wellbutrin 150 mg twice a day. No side effects noted. Blood pressure is within normal limits.  Assessment/Plan:   1. Vitamin D deficiency We will refill Vit D 50k IU once a week for 1 month with 0 refills.  -Refill Vitamin D, Ergocalciferol, (DRISDOL) 1.25 MG (50000 UNIT) CAPS capsule; Take 1 capsule (50,000 Units total) by mouth every 7 (seven) days.  Dispense: 4 capsule; Refill: 0  2. Polyphagia We will refill Topiramate 25 mg daily for 1 month with 0 refills.  -Refill topiramate (TOPAMAX) 25 MG tablet; Take 1 tablet (25 mg total) by mouth daily.  Dispense: 30 tablet; Refill: 0  3. Other depression, with emotional eating We will refill Wellbutrin SR 150 mg twice a day for 1 month with 0 refills.   -Refill buPROPion (WELLBUTRIN SR) 150 MG 12 hr tablet; Take 1 tablet (150  mg total) by mouth 2 (two) times daily.  Dispense: 180 tablet; Refill: 0  4. Obesity with current BMI of 67.7 We will increase wegovy to 1 mg SubQ once weekly for 1 month with 0 refills.  -Increase/Refill Semaglutide-Weight Management (WEGOVY) 1 MG/0.5ML SOAJ; Inject 1 mg into the skin once a week.  Dispense: 2 mL; Refill: 0  Catherine [name] is currently in the action stage of change. As such, her goal is to continue with weight loss efforts. She has agreed to the Category 4 Plan.   Exercise goals: All adults should avoid inactivity. Some physical activity is better than none, and adults who participate in any amount of physical activity gain some health benefits.  Catherine [name] will continue Physical Therapy.  Behavioral modification strategies: increasing lean protein intake, meal planning and cooking strategies, keeping healthy foods in the home, and planning for success.  Catherine [name] has agreed to follow-up with our clinic in 4 weeks. She was informed of the importance of frequent follow-up visits to maximize her success with intensive lifestyle modifications for her multiple health conditions.   Objective:   Blood pressure 132/79, pulse 94, temperature 97.9 F (36.6 C), height 5\' 7"  (1.702 m), weight (!) 432 lb (196 kg), SpO2 100 %. Body mass index is 67.66 kg/m.  General: Cooperative, alert, well developed, in no acute distress. HEENT: Conjunctivae and lids unremarkable. Cardiovascular: Regular rhythm.  Lungs: Normal work of breathing. Neurologic: No focal deficits.   Lab Results  Component Value Date   CREATININE 0.91 06/20/2021   BUN  16 06/20/2021   NA 140 06/20/2021   K 4.5 06/20/2021   CL 100 06/20/2021   CO2 24 06/20/2021   Lab Results  Component Value Date   ALT 25 06/20/2021   AST 24 06/20/2021   ALKPHOS 61 06/20/2021   BILITOT 0.6 06/20/2021   Lab Results  Component Value Date   HGBA1C 5.5 01/13/2022   HGBA1C 5.3 06/20/2021   HGBA1C 5.4 09/11/2020   HGBA1C 5.4  03/06/2020   HGBA1C 5.5 11/22/2019   Lab Results  Component Value Date   INSULIN 39.8 (H) 01/13/2022   INSULIN 29.4 (H) 06/20/2021   INSULIN 43.4 (H) 09/11/2020   INSULIN 39.2 (H) 03/06/2020   INSULIN 32.5 (H) 11/22/2019   Lab Results  Component Value Date   TSH 2.670 01/13/2022   Lab Results  Component Value Date   CHOL 146 07/26/2019   HDL 46 07/26/2019   LDLCALC 90 07/26/2019   TRIG 45 07/26/2019   CHOLHDL 3.6 11/21/2015   Lab Results  Component Value Date   VD25OH 49.8 01/13/2022   VD25OH 38.6 06/20/2021   VD25OH 47.6 09/11/2020   Lab Results  Component Value Date   WBC 11.1 (H) 07/26/2019   HGB 12.9 07/26/2019   HCT 38.1 07/26/2019   MCV 88 07/26/2019   PLT 219 07/26/2019   Lab Results  Component Value Date   IRON 25 (L) 05/18/2017   TIBC 363 05/18/2017   FERRITIN 16 05/18/2017   Attestation Statements:   Reviewed by clinician on day of visit: allergies, medications, problem list, medical history, surgical history, family history, social history, and previous encounter notes.  I, Fortino Sic, RMA am acting as transcriptionist for Reuben Likes, MD.  I have reviewed the above documentation for accuracy and completeness, and I agree with the above. - Reuben Likes, MD

## 2022-06-26 ENCOUNTER — Other Ambulatory Visit (HOSPITAL_COMMUNITY): Payer: Self-pay

## 2022-06-27 ENCOUNTER — Encounter (INDEPENDENT_AMBULATORY_CARE_PROVIDER_SITE_OTHER): Payer: Self-pay | Admitting: Family Medicine

## 2022-06-27 NOTE — Progress Notes (Signed)
Reviewed and agree with assessment/plan.   Gerritt Galentine, MD [facility] 06/27/2022, 8:53 AM Pager:  [phone]  

## 2022-06-30 NOTE — Telephone Encounter (Signed)
Can you please email pt the denial letter? I am unable to get into the website. Thanks

## 2022-06-30 NOTE — Telephone Encounter (Signed)
Prior authorization denial letter is scanned in chart in media section. Patient can call there pharmacy benefits to obtain further information on appeal.

## 2022-07-01 ENCOUNTER — Ambulatory Visit: Payer: No Typology Code available for payment source

## 2022-07-07 ENCOUNTER — Encounter (INDEPENDENT_AMBULATORY_CARE_PROVIDER_SITE_OTHER): Payer: Self-pay | Admitting: Family Medicine

## 2022-07-08 ENCOUNTER — Ambulatory Visit: Payer: No Typology Code available for payment source | Admitting: Physical Therapy

## 2022-07-08 ENCOUNTER — Other Ambulatory Visit: Payer: Self-pay

## 2022-07-08 ENCOUNTER — Encounter: Payer: Self-pay | Admitting: Physical Therapy

## 2022-07-08 DIAGNOSIS — M5459 Other low back pain: Secondary | ICD-10-CM

## 2022-07-08 DIAGNOSIS — M546 Pain in thoracic spine: Secondary | ICD-10-CM

## 2022-07-08 DIAGNOSIS — M6281 Muscle weakness (generalized): Secondary | ICD-10-CM

## 2022-07-08 NOTE — Therapy (Addendum)
OUTPATIENT PHYSICAL THERAPY TREATMENT NOTE  DISCHARGE   Patient Name: Catherine [name] MRN: [id number] DOB:06-27-1981, 41 y.o., female Today's Date: 07/08/2022  PCP: Bess Kinds, MD   REFERRING PROVIDER: Naida Sleight, PA-C   END OF SESSION:   PT End of Session - 07/08/22 1708     Visit Number 3    Number of Visits 9    Date for PT Re-Evaluation 08/04/22    Authorization Type [employer]    PT Start Time 1700    PT Stop Time 1740    PT Time Calculation (min) 40 min    Activity Tolerance Patient tolerated treatment well    Behavior During Therapy WFL for tasks assessed/performed              Past Medical History:  Diagnosis Date   Allergy    Anxiety    Arthritis    Asthma    since baby, seasonal   ASTHMA, UNSPECIFIED 12/10/2006   Qualifier: Diagnosis of  By: Abundio Miu     CELLULITIS AND ABSCESS OF LEG EXCEPT FOOT 12/30/2007   Qualifier: Diagnosis of  By: Daphine Deutscher FNP, [place]     Cyst of bone    right side of spine   DEPENDENT EDEMA, LEGS, BILATERAL 11/22/2008   Qualifier: Diagnosis of  By: Barbaraann Barthel MD, [place]     Depression 2003Dx   Edema    Food allergy    citris   FOOT PAIN, RIGHT 11/22/2008   Qualifier: Diagnosis of  By: Barbaraann Barthel MD, [place]     Gastric ulcer    GERD (gastroesophageal reflux disease)    Gout    Hypertension    took Norvasc for 3 months, doctor discontinued no longer on BP medications   Joint pain    KNEE INJURY, LEFT 01/01/2009   Qualifier: Diagnosis of  By: Delrae Alfred MD, [place]     Migraine    ONYCHOMYCOSIS, TOENAILS 11/22/2008   Qualifier: Diagnosis of  By: Barbaraann Barthel MD, [place]     Shortness of breath    Sleep apnea    TINEA PEDIS 11/22/2008   Qualifier: Diagnosis of  By: Barbaraann Barthel MD, Benetta Spar     Past Surgical History:  Procedure Laterality Date   DILITATION & CURRETTAGE/HYSTROSCOPY WITH NOVASURE ABLATION N/A 07/03/2017   Procedure: DILATATION & CURETTAGE/HYSTEROSCOPY WITH NOVASURE ABLATION;  Surgeon: Genia Del, MD;  Location: [facility];  Service: Gynecology;  Laterality: N/A;   UPPER GI ENDOSCOPY     WISDOM TOOTH EXTRACTION  2008   x4   Patient Active Problem List   Diagnosis Date Noted   Thoracic back pain 04/08/2022   SOBOE (shortness of breath on exertion) 03/10/2022   At risk for activity intolerance 03/10/2022   Skin lesion 02/28/2022   Genetic testing 02/26/2022   At high risk for breast cancer 02/26/2022   PCOS (polycystic ovarian syndrome) 02/07/2022   Abnormal mammogram 02/07/2022   Family history of breast cancer 02/07/2022   Family history of pancreatic cancer 02/07/2022   Family history of prostate cancer 02/07/2022   Chronic pain syndrome 07/26/2021   Polyphagia 01/23/2021   Bursitis 01/01/2021   Pain in both lower extremities 11/16/2020   Peroneal neuropathy 11/16/2020   Acute otitis externa of both ears 03/02/2020   Class 3 severe obesity with serious comorbidity and body mass index (BMI) greater than or equal to 70 in adult (HCC) 06/10/2017   Fluid retention 06/10/2017   Menorrhagia 05/18/2017   Sacral radiculopathy 02/04/2017   Insulin resistance 02/02/2017  At risk for violence to self related to depression 02/02/2017   Morbid obesity ([place]) 01/01/2017   Sore throat (viral) 11/21/2016   Vitamin D deficiency 11/12/2016   Asthma 07/25/2016   Abdominal pain 06/04/2016   Otitis, externa, infective 06/04/2016   Dysuria 03/20/2016   Gout 11/21/2015   Obesity, morbid, BMI 50 or higher ([place]) 05/14/2015   BIPOLAR DISORDER UNSPECIFIED 11/22/2008   Depression 12/10/2006   MIGRAINE, UNSPEC., W/O INTRACTABLE MIGRAINE 12/10/2006   GASTROESOPHAGEAL REFLUX, NO ESOPHAGITIS 12/10/2006   OSA (obstructive sleep apnea) 12/10/2006    REFERRING DIAG: Other spondylosis with radiculopathy, lumbar region  THERAPY DIAG:  Other low back pain  Pain in thoracic spine  Muscle weakness (generalized)  Rationale for Evaluation and Treatment Rehabilitation  PERTINENT  HISTORY: BMI, depression, history of bipolar, chronic pain  PRECAUTIONS: None   SUBJECTIVE: Patient reports she had to miss last visit because she had a fall when tripped on a runner while vacuuming her floor and hit the left shoulder on her door. She reports still feels a little knot in the left shoulder. She does feel   PAIN:  Are you having pain? Yes:  NPRS scale: 4/10 (lower), 0/10 (upper) Pain location: Lower back, upper back Pain description: Spasms, burning, aching, pulling, sharp Aggravating factors: Standing, walking Relieving factors: Hot shower, heat pack, rest, medication (muscle relaxer)  PATIENT GOALS: Pain relief, improve standing and walking   OBJECTIVE: (objective measures completed at initial evaluation unless otherwise dated) PATIENT SURVEYS:  FOTO 38% functional status   MUSCLE LENGTH: Hamstring and hip flexor deficits   POSTURE:            Rounded shoulder posture   PALPATION: Tender to palpation bilateral (R>L) lumbar paraspinals, bilateral upper trap and parascapular region   LUMBAR ROM:     Active A/PROM  eval  Flexion 50%  Extension Unable  Right lateral flexion 25%  Left lateral flexion 25%  Right rotation 25%  Left rotation 25%                        All lumbar AROM limited secondary to pain   LOWER EXTREMITY MMT:     MMT Right eval Left eval  Hip flexion 4- 4-  Hip extension - -  Hip abduction 3- 3-  Knee flexion 4 4  Knee extension 4 4    LUMBAR SPECIAL TESTS:  Radicular testing inconclusive   FUNCTIONAL TESTS:  Patient exhibits difficulty with performing sit to stand, she requires BUE assist to stand and with hand climbing up legs to achieve upright position   GAIT: Distance walked: 50 ft Assistive device utilized: None Level of assistance: Complete [place] Comments: decreased gait speed, antalgic on right, coxalgic gait with stance, bilateral toe out     TODAY'S TREATMENT  [facility] Adult PT Treatment:                                                 DATE: 07/08/2022 Therapeutic Exercise: Seated physioball roll out stretch 10 x 10 sec Seated pelvic tilt x 10 tanding row with green 2 x 10 Standing shoulder extension with green 2 x 10 Standing pallof press with green 2 x 10 each Supine on bolster SLR x 10 each Supine on bolster PPT with adductor ball squeeze x 10 Supine on bolster clamshell with blue  x 10 Supine on bolster marching x 10 each Seated shoulder horizontal abduction with red 2 x 10   [facility] Adult PT Treatment:                                                DATE: 06/24/2022 Therapeutic Exercise: Seated pelvic tilt x 10 Seated shoulder blade squeezes x 10 Seated shoulder horizontal abduction with yellow 2 x 10 Seated upper trap and levator stretch 2 x 20 sec each Seated physioball roll out stretch 5 x 10 sec Standing row with green 2 x 10 Standing shoulder extension with blue 2 x 10 Seated hamstring stretch 2 x 20 sec each Seated clamshell with blue 2 x 10  [facility] Adult PT Treatment:                                                DATE: 06/09/2022 Therapeutic Exercise: Trial supine SLR and PPT but patient unable to tolerate supine positioning Seated pelvic tilt x 10 Seated clamshell with green x 10 Seated shoulder blade squeezes x 10 Seated hamstring stretch 2 x 20 sec   PATIENT EDUCATION:  Education details: HEP update Person educated: Patient Education method: Explanation, Demonstration, Tactile cues, Verbal cues, Handout Education comprehension: verbalized understanding, returned demonstration, verbal cues required, tactile cues required, and needs further education   HOME EXERCISE PROGRAM: Access Code: [email or web address]     ASSESSMENT: CLINICAL IMPRESSION: Patient tolerated therapy well with no adverse effects. Therapy focused on progression of core, hip, and postural strengthening and patient able to perform more standing and supine inclined exercises. She did exhibit decreased standing  tolerance and required occasional seated rest breaks with her standing exercises. Updated HEP HEP this visit for more standing exercises. Patient would benefit from continued skilled PT to progress her mobility and strength in order to reduce pain and maximize functional ability.     OBJECTIVE IMPAIRMENTS decreased activity tolerance, difficulty walking, decreased ROM, decreased strength, increased muscle spasms, impaired flexibility, improper body mechanics, postural dysfunction, obesity, and pain.    ACTIVITY LIMITATIONS lifting, bending, sitting, standing, squatting, sleeping, transfers, bed mobility, bathing, hygiene/grooming, and locomotion level   PARTICIPATION LIMITATIONS: meal prep, cleaning, laundry, driving, shopping, community activity, and occupation   PERSONAL FACTORS Fitness, Past/current experiences, Time since onset of injury/illness/exacerbation, and 3+ comorbidities: see above  are also affecting patient's functional outcome.      GOALS: Goals reviewed with patient? Yes   SHORT TERM GOALS: Target date: 07/07/2022   Patient will be I with initial HEP in order to progress with therapy. Baseline: HEP provided at eval Goal status: INITIAL   2.  Patient will report constant low back pain level </= 4/10 in order to reduce functional limitations. Baseline: low back pain 7/10 Goal status: INITIAL   3.  Patient will be able to stand >/= 10 minutes before needing rest break due to back pain to improve ability to perform household tasks Baseline: Patient reports ability to stand about 5 minutes before need to sit due to pain Goal status: INITIAL   LONG TERM GOALS: Target date: 08/04/2022   Patient will be I with final HEP to maintain progress from PT. Baseline: HEP provided at eval Goal status: INITIAL  2.  Patient will report >/= 56% status on FOTO to indicate improved functional ability. Baseline: 38% functional status Goal status: INITIAL   3.  Patient will  demonstrate >/= 25% improvement in all planes of lumbar AROM in order to improve ability to perform dressing and self care tasks with limitation. Baseline: patient exhibits limitation in all lumbar AROM (see above) Goal status: INITIAL   4.  Patient will exhibit gross strength of core and hips >/= 4-/5 MMT in order to improve standing and walking tolerance Baseline: grossly 3-/5 MMT Goal status: INITIAL   5.  Patient will report back pain level </= 3/10 with all activity and walking to improve community access. Baseline: low back pain 7/10, upper back 4-5/10 Goal status: INITIAL     PLAN: PT FREQUENCY: 1x/week   PT DURATION: 8 weeks   PLANNED INTERVENTIONS: Therapeutic exercises, Therapeutic activity, Neuromuscular re-education, Balance training, Gait training, Patient/Family education, Self Care, Joint mobilization, Joint manipulation, Aquatic Therapy, Dry Needling, Electrical stimulation, Spinal manipulation, Spinal mobilization, Cryotherapy, Moist heat, Manual therapy, and Re-evaluation.   PLAN FOR NEXT SESSION: Review HEP and progress PRN, trial dry needling/stretching for lumbar spine/upper back and neck region PRN, core activation and postural exercises, progress to standing hip, core, and postural strengthening as able    Hilda Blades, PT, DPT, LAT, ATC 07/08/22  5:44 PM Phone: [phone] Fax: [phone]     PHYSICAL THERAPY DISCHARGE SUMMARY  Visits from Start of Care: 3  Current functional level related to goals / functional outcomes: See above   Remaining deficits: See above   Education / Equipment: HEP   Patient agrees to discharge. Patient goals were not met. Patient is being discharged due to not returning since the last visit.  Hilda Blades, PT, DPT, LAT, ATC 08/27/22  9:45 AM Phone: [phone] Fax: [phone]

## 2022-07-08 NOTE — Patient Instructions (Signed)
Access Code: [email or web address] URL: [email or web address] Date: 07/08/2022 Prepared by: Hilda Blades  Exercises - Seated Hip Abduction with Resistance  - 2 x daily - 2 sets - 10 reps - Seated Pelvic Tilt  - 2 x daily - 2 sets - 10 reps - Seated Scapular Retraction  - 2 x daily - 2 sets - 10 reps - Seated Hamstring Stretch  - 2 x daily - 3 reps - 20 seconds hold - Seated Shoulder Horizontal Abduction with Resistance - Palms Down  - 2 x daily - 2 sets - 10 reps - Standing Row with Anchored Resistance  - 1-2 x daily - 2 sets - 10 reps - Scapular Retraction with Resistance Advanced  - 1-2 x daily - 2 sets - 10 reps - Standing Anti-Rotation Press with Anchored Resistance  - 1-2 x daily - 2 sets - 10 reps

## 2022-07-09 ENCOUNTER — Encounter (INDEPENDENT_AMBULATORY_CARE_PROVIDER_SITE_OTHER): Payer: Self-pay

## 2022-07-09 ENCOUNTER — Other Ambulatory Visit: Payer: Self-pay | Admitting: Student

## 2022-07-15 ENCOUNTER — Ambulatory Visit: Payer: No Typology Code available for payment source | Admitting: Physical Therapy

## 2022-07-17 ENCOUNTER — Telehealth (INDEPENDENT_AMBULATORY_CARE_PROVIDER_SITE_OTHER): Payer: Self-pay | Admitting: Family Medicine

## 2022-07-17 NOTE — Telephone Encounter (Signed)
[employer] called 10/05 Dr Lennox Laity for a peer to peer review.phone # [phone] ext [phone]. Case #[id number]

## 2022-07-17 NOTE — Telephone Encounter (Signed)
Please advise 

## 2022-07-21 ENCOUNTER — Other Ambulatory Visit: Payer: Self-pay | Admitting: Family Medicine

## 2022-07-21 DIAGNOSIS — R609 Edema, unspecified: Secondary | ICD-10-CM

## 2022-07-22 ENCOUNTER — Ambulatory Visit: Payer: No Typology Code available for payment source | Admitting: Physical Therapy

## 2022-07-23 ENCOUNTER — Ambulatory Visit (INDEPENDENT_AMBULATORY_CARE_PROVIDER_SITE_OTHER): Payer: PRIVATE HEALTH INSURANCE | Admitting: Family Medicine

## 2022-07-24 NOTE — Progress Notes (Signed)
  SUBJECTIVE:   CHIEF COMPLAINT / HPI:   Recent falls: 2 falls in last 3 weeks, she was vacuuming in her living room and foot got tied up under her and she fell on her left arm and wrist into her patio door. States it didn't hurt much because of the protective screen.Denies any further issues from that fall. Last weekend, she was working for [employer] and fell on an uneven surface and fell on her right knee and hand on a concrete surface. She uses a cane but was using that before these incidents. Her big concern is lack of feeling over her right knee where she hit the concrete.   PERTINENT  PMH / PSH: Morbid obesity, OSA, chronic pain syndrome  OBJECTIVE:  BP 116/86   Pulse 97   Ht 5\' 7"  (1.702 m)   Wt (!) 444 lb 6.4 oz (201.6 kg)   SpO2 100%   BMI 69.60 kg/m  Physical Exam Constitutional:      Appearance: Normal appearance.  Musculoskeletal:     Right wrist: Normal. No swelling or deformity. Normal range of motion.     Right hand: Normal.     Right lower leg: Swelling and tenderness present. No edema.       Legs:  Neurological:     Mental Status: She is alert.    ASSESSMENT/PLAN:  Acute pain of right knee Assessment & Plan: Generalized tenderness over patellar region, will obtain knee XR.  Conservative management, MRI would not be beneficial.  Discontinued Cymbalta, restarted gabapentin at lower dosage of 300 mg daily for her chronic pain.  Orders: -     DG Knee Complete 4 Views Right; Future  Pain in both lower extremities -     Gabapentin; Take 1 capsule (300 mg total) by mouth daily.  Dispense: 60 capsule; Refill: 1  Need for immunization against influenza -     Flu Vaccine QUAD 23mo+IM (Fluarix, Fluzone & Alfiuria Quad PF)  Return if symptoms worsen or fail to improve. Wells Guiles, DO 07/25/2022, 4:31 PM PGY-2, [place]

## 2022-07-25 ENCOUNTER — Ambulatory Visit (INDEPENDENT_AMBULATORY_CARE_PROVIDER_SITE_OTHER): Payer: No Typology Code available for payment source | Admitting: Student

## 2022-07-25 ENCOUNTER — Encounter: Payer: Self-pay | Admitting: Student

## 2022-07-25 VITALS — BP 116/86 | HR 97 | Ht 67.0 in | Wt >= 6400 oz

## 2022-07-25 DIAGNOSIS — M79605 Pain in left leg: Secondary | ICD-10-CM

## 2022-07-25 DIAGNOSIS — M79604 Pain in right leg: Secondary | ICD-10-CM | POA: Diagnosis not present

## 2022-07-25 DIAGNOSIS — Z23 Encounter for immunization: Secondary | ICD-10-CM | POA: Diagnosis not present

## 2022-07-25 DIAGNOSIS — M25561 Pain in right knee: Secondary | ICD-10-CM | POA: Diagnosis not present

## 2022-07-25 MED ORDER — GABAPENTIN 300 MG PO CAPS: 300.0000 mg | ORAL_CAPSULE | Freq: Every day | ORAL | 1 refills | Status: DC

## 2022-07-25 NOTE — Patient Instructions (Signed)
It was great to see you today! Thank you for choosing [facility] for your primary care. Catherine [name] was seen for fall.  Today we addressed: I am minimally suspicious of a fracture, we will obtain an x-ray of the right knee.  This should improve with time otherwise.  For pain, we discontinued Cymbalta and restarted gabapentin at 300 mg daily.  I have placed an order for right knee x-ray.  Please go to [place] at [facility] or at [facility] to have this completed.  You do not need an appointment, but if you would like to call them beforehand, their number is [phone].  We will contact you with your results afterwards.   If you haven't already, sign up for My Chart to have easy access to your labs results, and communication with your primary care physician.  Call the clinic at [phone] if your symptoms worsen or you have any concerns.  You should return to our clinic Return if symptoms worsen or fail to improve. Please arrive 15 minutes before your appointment to ensure smooth check in process.  We appreciate your efforts in making this happen.  Thank you for allowing me to participate in your care, Wells Guiles, DO 07/25/2022, 4:25 PM PGY-2, [place]

## 2022-07-25 NOTE — Assessment & Plan Note (Signed)
Generalized tenderness over patellar region, will obtain knee XR.  Conservative management, MRI would not be beneficial.  Discontinued Cymbalta, restarted gabapentin at lower dosage of 300 mg daily for her chronic pain.

## 2022-07-29 ENCOUNTER — Ambulatory Visit
Admission: RE | Admit: 2022-07-29 | Discharge: 2022-07-29 | Disposition: A | Discharge status: Home / Self Care | Payer: No Typology Code available for payment source | Source: Ambulatory Visit | Attending: Family Medicine | Admitting: Family Medicine

## 2022-07-29 ENCOUNTER — Ambulatory Visit: Payer: No Typology Code available for payment source | Admitting: Physical Therapy

## 2022-07-29 DIAGNOSIS — M25561 Pain in right knee: Secondary | ICD-10-CM

## 2022-07-31 ENCOUNTER — Telehealth: Payer: Self-pay

## 2022-07-31 NOTE — Telephone Encounter (Signed)
Patient calls nurse line requesting order for 4 prong base cane. She states that she has had frequent falls and would like assistive device for walking.   Will forward request to PCP.   Talbot Grumbling, RN

## 2022-08-02 ENCOUNTER — Telehealth: Payer: Self-pay | Admitting: Student

## 2022-08-02 DIAGNOSIS — G8929 Other chronic pain: Secondary | ICD-10-CM

## 2022-08-02 NOTE — Telephone Encounter (Signed)
Patient repots she has weakness in her right leg and has had rehab and PT for it over the years. Patient repots she had a fall in 2010 that caused her to start using a cane. She uses a cane to walk inside her house and seated [place] when going outside the house. She uses her cane because she cant get her [place] up the 3 flights of stairs for where she lives.    Patient requesting 4 pronged cane for more stability and is currently in PT for her back.  Will place order for cane

## 2022-08-04 ENCOUNTER — Ambulatory Visit: Payer: No Typology Code available for payment source | Admitting: Physical Therapy

## 2022-08-12 ENCOUNTER — Telehealth: Payer: No Typology Code available for payment source | Admitting: Family Medicine

## 2022-08-12 DIAGNOSIS — R21 Rash and other nonspecific skin eruption: Secondary | ICD-10-CM

## 2022-08-12 NOTE — Progress Notes (Signed)
[place]   Rash of buttocks- inability to exam, it is recurring as well.  In person would be best to see if yeast/fungal/strep/staph infection.

## 2022-08-13 ENCOUNTER — Encounter: Payer: Self-pay | Admitting: Student

## 2022-08-14 NOTE — Telephone Encounter (Signed)
Received confirmation from [employer] that order has been received.   Talbot Grumbling, RN

## 2022-08-14 NOTE — Telephone Encounter (Signed)
Community message sent to [employer] for DME order.   Will await response.   Talbot Grumbling, RN

## 2022-08-23 ENCOUNTER — Other Ambulatory Visit: Payer: Self-pay | Admitting: Family Medicine

## 2022-08-23 ENCOUNTER — Other Ambulatory Visit: Payer: Self-pay | Admitting: Student

## 2022-08-25 NOTE — Telephone Encounter (Signed)
Called to ask about tizanidine use for patient. Patient noted that it was last refilled for her back, but patient has been taking it for flare ups of her bursitis in her knee. Patient reports she is having a flare up and takes it as needed before bed. Told patient this is a strong medicine and I don't want her on it for long periods. Will refill med for this flare.   Will consider sending back to [facility] if flare becomes issues again.

## 2022-09-15 ENCOUNTER — Ambulatory Visit (INDEPENDENT_AMBULATORY_CARE_PROVIDER_SITE_OTHER): Payer: No Typology Code available for payment source | Admitting: Student

## 2022-09-15 ENCOUNTER — Encounter: Payer: Self-pay | Admitting: Student

## 2022-09-15 VITALS — BP 128/68 | HR 86 | Ht 67.0 in | Wt >= 6400 oz

## 2022-09-15 DIAGNOSIS — G5601 Carpal tunnel syndrome, right upper limb: Secondary | ICD-10-CM | POA: Diagnosis not present

## 2022-09-15 NOTE — Patient Instructions (Addendum)
Great seeing you today.  I believe you have carpal tunnel syndrome.  We will trial a wrist splint ("cock-up wrist splint") for 6 weeks. You can ask the pharmacist at [facility] where to find these. They are also on [employer], which I showed you. Wear it every night.  If not getting better, come back and we can send you to [facility] for an injection and/or get a cervical neck X-ray.  Have a wonderful day, Dr. Melissa Noon

## 2022-09-15 NOTE — Progress Notes (Signed)
    SUBJECTIVE:   CHIEF COMPLAINT / HPI:   Catherine [name] is a 41 year old female here to discuss right arm pain.  She has been having numbness and tingling, with pain that awakens her at night and she has to shake her hand to relieve it. 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  The pain and numbness tingling are mostly in the first second and third digits.  Although occasionally, she has pain going all the way up her arm into her neck.   PERTINENT  PMH / PSH: Reviewed  OBJECTIVE:   BP 128/68   Pulse 86   Ht 5\' 7"  (1.702 m)   Wt (!) 455 lb (206.4 kg)   SpO2 100%   BMI 71.26 kg/m   General: Well-appearing, very pleasant Respiratory: Normal work of breathing on room air MSK, right hand: Inspection yields no ecchymosis, erythema, rash or lesions.  No bony deformities.  Normal sensation.  Normal grip strength bilaterally.  Negative Phalen's.   ASSESSMENT/PLAN:   Carpal tunnel syndrome of right wrist Symptoms most consistent with carpal tunnel syndrome.  Also consider consider cervical radiculopathy given pain radiation from neck all the way down through arm, forearm and to wrist. Will start with cock up wrist splint at night.  Patient said that she will buy one over-the-counter. If no improvement in 4 to 6 weeks, can consider sports medicine referral for corticosteroid injection.  Can also consider cervical neck x-ray at that time.     , DO [facility]

## 2022-09-15 NOTE — Assessment & Plan Note (Signed)
Symptoms most consistent with carpal tunnel syndrome.  Also consider consider cervical radiculopathy given pain radiation from neck all the way down through arm, forearm and to wrist. Will start with cock up wrist splint at night.  Patient said that she will buy one over-the-counter. If no improvement in 4 to 6 weeks, can consider sports medicine referral for corticosteroid injection.  Can also consider cervical neck x-ray at that time.

## 2022-11-06 ENCOUNTER — Other Ambulatory Visit: Payer: Self-pay | Admitting: Student

## 2022-11-18 ENCOUNTER — Encounter (INDEPENDENT_AMBULATORY_CARE_PROVIDER_SITE_OTHER): Payer: Self-pay | Admitting: Family Medicine

## 2022-11-18 ENCOUNTER — Ambulatory Visit (INDEPENDENT_AMBULATORY_CARE_PROVIDER_SITE_OTHER): Payer: PRIVATE HEALTH INSURANCE | Admitting: Family Medicine

## 2022-11-18 VITALS — BP 133/84 | HR 105 | Temp 98.2°F | Ht 67.0 in | Wt >= 6400 oz

## 2022-11-18 DIAGNOSIS — Z6841 Body Mass Index (BMI) 40.0 and over, adult: Secondary | ICD-10-CM

## 2022-11-18 DIAGNOSIS — E282 Polycystic ovarian syndrome: Secondary | ICD-10-CM | POA: Diagnosis not present

## 2022-11-18 DIAGNOSIS — E559 Vitamin D deficiency, unspecified: Secondary | ICD-10-CM

## 2022-11-18 DIAGNOSIS — E669 Obesity, unspecified: Secondary | ICD-10-CM | POA: Diagnosis not present

## 2022-11-18 MED ORDER — WEGOVY 0.25 MG/0.5ML ~~LOC~~ SOAJ: 0.2500 mg | SUBCUTANEOUS | 0 refills | Status: DC

## 2022-11-18 MED ORDER — VITAMIN D (ERGOCALCIFEROL) 1.25 MG (50000 UNIT) PO CAPS: 50000.0000 [IU] | ORAL_CAPSULE | ORAL | 0 refills | Status: DC

## 2022-11-18 NOTE — Progress Notes (Signed)
Chief Complaint:   OBESITY Candid is here to discuss her progress with her obesity treatment plan along with follow-up of her obesity related diagnoses. Catherine [name] is on the Category 4 Plan and states she is following her eating plan approximately 95% of the time. Catherine [name] states she is not exercising.   Today's visit was #: 64 Starting weight: 468 lbs Starting date: 10/27/2016 Today's weight: 443 lbs Today's date: 11/18/2022 Total lbs lost to date: 25 Total lbs lost since last in-office visit: 0  Interim History: Patient has been back on plan for the last couple of weeks.  She prior to the last few weeks really voices she hadn't been doing much of anything.  She has had a fall since the last appointment.  She got discouraged with the last denial of Wegovy.  She has the same insurance but is trying to figure out whether or not her insurance will cover [phone].  Feels like she needs additional medicine along with her meal plan.  She has stopped taking Wellbutrin due to concerns over addition of another medication and possible seizures.  Subjective:   1. Vitamin D deficiency Catherine [name] hasn't been on vitamin D since her last appointment. She notes fatigue.  2. PCOS (polycystic ovarian syndrome) Catherine [name]'s last insulin level was still elevated. She is on metformin with no GI side effects.  3. BMI 60.0-69.9, adult (HCC)  Assessment/Plan:   1. Vitamin D deficiency Low Vitamin D level contributes to fatigue and are associated with obesity, breast, and colon cancer. She agrees to continue to take prescription Vitamin D '@50'$ ,000 IU every week and will follow-up for routine testing of Vitamin D, at least 2-3 times per year to avoid over-replacement.  - Vitamin D, Ergocalciferol, (DRISDOL) 1.25 MG (50000 UNIT) CAPS capsule; Take 1 capsule (50,000 Units total) by mouth every 7 (seven) days.  Dispense: 4 capsule; Refill: 0  2. PCOS (polycystic ovarian syndrome) Promise agrees to get  labwork either at her next appointment or at her PCP's office.  3. BMI 60.0-69.9, adult (HCC)  - Semaglutide-Weight Management (WEGOVY) 0.25 MG/0.5ML SOAJ; Inject 0.25 mg into the skin once a week.  Dispense: 2 mL; Refill: 0  4. Obesity with starting BMI of 75.5  - Semaglutide-Weight Management (WEGOVY) 0.25 MG/0.5ML SOAJ; Inject 0.25 mg into the skin once a week.  Dispense: 2 mL; Refill: 0  Catherine [name] is currently in the action stage of change. As such, her goal is to continue with weight loss efforts. She has agreed to the Category 4 Plan.   Exercise goals: No exercise has been prescribed at this time.  Behavioral modification strategies: increasing lean protein intake, meal planning and cooking strategies, keeping healthy foods in the home, and planning for success.  Catherine [name] has agreed to follow-up with our clinic in 3 weeks for fasting labs. She was informed of the importance of frequent follow-up visits to maximize her success with intensive lifestyle modifications for her multiple health conditions.   Objective:   Blood pressure 133/84, pulse (!) 105, temperature 98.2 F (36.8 C), height '5\' 7"'$  (1.702 m), weight (!) 443 lb (200.9 kg), SpO2 100 %. Body mass index is 69.38 kg/m.  General: Cooperative, alert, well developed, in no acute distress. HEENT: Conjunctivae and lids unremarkable. Cardiovascular: Regular rhythm.  Lungs: Normal work of breathing. Neurologic: No focal deficits.   Lab Results  Component Value Date   CREATININE 0.91 06/20/2021   BUN 16 06/20/2021   NA 140 06/20/2021  K 4.5 06/20/2021   CL 100 06/20/2021   CO2 24 06/20/2021   Lab Results  Component Value Date   ALT 25 06/20/2021   AST 24 06/20/2021   ALKPHOS 61 06/20/2021   BILITOT 0.6 06/20/2021   Lab Results  Component Value Date   HGBA1C 5.5 01/13/2022   HGBA1C 5.3 06/20/2021   HGBA1C 5.4 09/11/2020   HGBA1C 5.4 03/06/2020   HGBA1C 5.5 11/22/2019   Lab Results  Component Value Date    INSULIN 39.8 (H) 01/13/2022   INSULIN 29.4 (H) 06/20/2021   INSULIN 43.4 (H) 09/11/2020   INSULIN 39.2 (H) 03/06/2020   INSULIN 32.5 (H) 11/22/2019   Lab Results  Component Value Date   TSH 2.670 01/13/2022   Lab Results  Component Value Date   CHOL 146 07/26/2019   HDL 46 07/26/2019   LDLCALC 90 07/26/2019   TRIG 45 07/26/2019   CHOLHDL 3.6 11/21/2015   Lab Results  Component Value Date   VD25OH 49.8 01/13/2022   VD25OH 38.6 06/20/2021   VD25OH 47.6 09/11/2020   Lab Results  Component Value Date   WBC 11.1 (H) 07/26/2019   HGB 12.9 07/26/2019   HCT 38.1 07/26/2019   MCV 88 07/26/2019   PLT 219 07/26/2019   Lab Results  Component Value Date   IRON 25 (L) 05/18/2017   TIBC 363 05/18/2017   FERRITIN 16 05/18/2017    Attestation Statements:   Reviewed by clinician on day of visit: allergies, medications, problem list, medical history, surgical history, family history, social history, and previous encounter notes.  IMarcille Blanco, CMA, am acting as transcriptionist for Coralie Common, MD  I have reviewed the above documentation for accuracy and completeness, and I agree with the above. - Coralie Common, MD

## 2022-11-20 ENCOUNTER — Encounter (INDEPENDENT_AMBULATORY_CARE_PROVIDER_SITE_OTHER): Payer: Self-pay

## 2022-11-21 ENCOUNTER — Encounter (INDEPENDENT_AMBULATORY_CARE_PROVIDER_SITE_OTHER): Payer: Self-pay | Admitting: Family Medicine

## 2022-11-24 NOTE — Telephone Encounter (Signed)
Please advise 

## 2022-11-25 ENCOUNTER — Other Ambulatory Visit (INDEPENDENT_AMBULATORY_CARE_PROVIDER_SITE_OTHER): Payer: Self-pay | Admitting: Family Medicine

## 2022-11-25 ENCOUNTER — Telehealth (INDEPENDENT_AMBULATORY_CARE_PROVIDER_SITE_OTHER): Payer: Self-pay

## 2022-11-25 ENCOUNTER — Other Ambulatory Visit: Payer: Self-pay | Admitting: Student

## 2022-11-25 DIAGNOSIS — M79604 Pain in right leg: Secondary | ICD-10-CM

## 2022-11-25 DIAGNOSIS — Z6841 Body Mass Index (BMI) 40.0 and over, adult: Secondary | ICD-10-CM

## 2022-11-25 MED ORDER — ZEPBOUND 2.5 MG/0.5ML ~~LOC~~ SOAJ: 2.5000 mg | SUBCUTANEOUS | 0 refills | Status: DC

## 2022-11-25 NOTE — Telephone Encounter (Signed)
PA for Zepbound started

## 2022-12-08 ENCOUNTER — Encounter (INDEPENDENT_AMBULATORY_CARE_PROVIDER_SITE_OTHER): Payer: Self-pay | Admitting: Family Medicine

## 2022-12-08 ENCOUNTER — Telehealth: Payer: Self-pay | Admitting: Student

## 2022-12-08 ENCOUNTER — Ambulatory Visit (INDEPENDENT_AMBULATORY_CARE_PROVIDER_SITE_OTHER): Payer: PRIVATE HEALTH INSURANCE | Admitting: Family Medicine

## 2022-12-08 VITALS — BP 134/79 | HR 91 | Temp 97.9°F | Ht 67.0 in | Wt >= 6400 oz

## 2022-12-08 DIAGNOSIS — E559 Vitamin D deficiency, unspecified: Secondary | ICD-10-CM | POA: Diagnosis not present

## 2022-12-08 DIAGNOSIS — E88819 Insulin resistance, unspecified: Secondary | ICD-10-CM

## 2022-12-08 DIAGNOSIS — R7401 Elevation of levels of liver transaminase levels: Secondary | ICD-10-CM

## 2022-12-08 DIAGNOSIS — E669 Obesity, unspecified: Secondary | ICD-10-CM

## 2022-12-08 DIAGNOSIS — Z6841 Body Mass Index (BMI) 40.0 and over, adult: Secondary | ICD-10-CM

## 2022-12-08 MED ORDER — VITAMIN D (ERGOCALCIFEROL) 1.25 MG (50000 UNIT) PO CAPS: 50000.0000 [IU] | ORAL_CAPSULE | ORAL | 0 refills | Status: DC

## 2022-12-08 NOTE — Progress Notes (Deleted)
Patient is trying to keep her head about water the last few weeks- taking it one day at a time.  She has been trying to follow Cat 4 most of the time.  She has switched to the yogurt option for breakfast and thinks perhaps she made a few slips with some of the microwave meals.  She did the prior authorization appeal and states that her insurance is likely not going to cover any medication for anti obesity medications.

## 2022-12-08 NOTE — Telephone Encounter (Signed)
Patient dropped of DMV form to be completed. Last DOS was 09/15/22. Placed in [employer].

## 2022-12-09 ENCOUNTER — Other Ambulatory Visit: Payer: Self-pay | Admitting: Student

## 2022-12-09 LAB — COMPREHENSIVE METABOLIC PANEL
ALT: 13 IU/L (ref 0–32)
AST: 17 IU/L (ref 0–40)
Albumin/Globulin Ratio: 1.4 (ref 1.2–2.2)
Albumin: 4 g/dL (ref 3.9–4.9)
Alkaline Phosphatase: 57 IU/L (ref 44–121)
BUN/Creatinine Ratio: 18 (ref 9–23)
BUN: 16 mg/dL (ref 6–24)
Bilirubin Total: 0.5 mg/dL (ref 0.0–1.2)
CO2: 22 mmol/L (ref 20–29)
Calcium: 9.1 mg/dL (ref 8.7–10.2)
Chloride: 105 mmol/L (ref 96–106)
Creatinine, Ser: 0.89 mg/dL (ref 0.57–1.00)
Globulin, Total: 2.8 g/dL (ref 1.5–4.5)
Glucose: 77 mg/dL (ref 70–99)
Potassium: 4.3 mmol/L (ref 3.5–5.2)
Sodium: 142 mmol/L (ref 134–144)
Total Protein: 6.8 g/dL (ref 6.0–8.5)
eGFR: 83 mL/min/{1.73_m2} (ref >59)

## 2022-12-09 LAB — HEMOGLOBIN A1C
Est. average glucose Bld gHb Est-mCnc: 120 mg/dL
Hgb A1c MFr Bld: 5.8 % — ABNORMAL HIGH (ref 4.8–5.6)

## 2022-12-09 LAB — INSULIN, RANDOM: INSULIN: 36.9 u[IU]/mL — ABNORMAL HIGH (ref 2.6–24.9)

## 2022-12-09 LAB — VITAMIN D 25 HYDROXY (VIT D DEFICIENCY, FRACTURES): Vit D, 25-Hydroxy: 37.4 ng/mL (ref 30.0–100.0)

## 2022-12-09 NOTE — Telephone Encounter (Signed)
Reviewed form and placed in PCP's box for completion.  .Elna Radovich R Patti Shorb, CMA  

## 2022-12-10 ENCOUNTER — Ambulatory Visit: Payer: No Typology Code available for payment source | Admitting: Primary Care

## 2022-12-10 NOTE — Progress Notes (Deleted)
$'@Patient'f$  ID: Catherine [name], female    DOB: 16-Sep-1981, 42 y.o.   MRN: [id number]  No chief complaint on file.   Referring provider: Holley Bouche, MD  HPI:  42 year old female, former smoker quit in 2016 (9-pack-year history).  Past medical history significant for OSA, asthma, migraine headaches, GERD, PCOS, insulin resistance, obesity.  Previous [facility] pulmonary encounter:  03/18/2022 Patient presents today for overdue follow-up sleep apnea. She has been off CPAP for several weeks. She lost the humidification chamber to her current CAP machine and has been unable to use since then. Since being off CPAP she reports more daytime sleepiness and headaches. She had an episode gasping for air several nights ago. Up until recently she reports moderate compliance with CPAP use, she would wear her mask on average 4-5 nights a week. She believes her pressure setting is either 9 or 11cm h20. She is working on weight loss efforts, she is on Twodot and dose recently increased.   Sleep questionnaire Symptoms- Gasping for air, daytime sleepiness and headache  Prior sleep study- Moderate OSA on sleep study 06/05/2013 Bedtime- 7:30-8pm Time to fall asleep- 15-52mns  Nocturnal awakenings- 1-2 times Out of bed/start of day- 6:30am Weight changes- +/- 30 lbs Do you operate heavy machinery- No Do you currently wear CPAP- Yes, 9 or 11cm h20 (recently lost part to CPAP and has been unable to use) Do you current wear oxygen- No Epworth- 16  06/25/2022 Patient contacted today to review sleep study results.  Patient had PSG on 06/13/2022 that showed evidence of severe obstructive sleep apnea, average AHI 40.6/h.  AHI while supine was 69.6/h.  Minimal SpO2 77% (average oxygen 93%). Patient had loud snoring.  Reviewed sleep study results with patient.  She was on CPAP but was unable to use due to missing parts. She will need an order for new CPAP machine.   OSA: - Hx OSA, she had a break in CPAP therapy d/t  broke machine - NPSG 06/13/22 showed severe OSA, AHI 40.6/hour - DME order placed for new CPAP machine auto titrate 5 to 15 cm H2O - Advised patient aim to wear CPAP every night for min 4-6 hours or longer - Encourage weight loss efforts and side sleeping position    12/10/2022- Interim hx Patient presents today for CPAP compliance. PSG on 06/13/2022 that showed evidence of severe obstructive sleep apnea, average AHI 40.6/h.  AHI while supine was 69.6/hr.        Allergies  Allergen Reactions   Citrus Swelling    Immunization History  Administered Date(s) Administered   Influenza Whole 07/13/2005   Influenza,inj,Quad PF,6+ Mos 07/13/2013, 07/05/2014, 07/26/2021, 07/25/2022   Influenza-Unspecified 05/28/2015, 06/25/2016, 07/01/2017, 07/17/2018   PPD Test 04/06/2019   Td 10/13/1998, 10/13/2001   Unspecified SARS-COV-2 Vaccination 09/14/2020, 10/05/2020    Past Medical History:  Diagnosis Date   Allergy    Anxiety    Arthritis    Asthma    since baby, seasonal   ASTHMA, UNSPECIFIED 12/10/2006   Qualifier: Diagnosis of  By: MEusebio Friendly    CELLULITIS AND ABSCESS OF LEG EXCEPT FOOT 12/30/2007   Qualifier: Diagnosis of  By: MHassell DoneFNP, [place]     Cyst of bone    right side of spine   DEPENDENT EDEMA, LEGS, BILATERAL 11/22/2008   Qualifier: Diagnosis of  By: RRadene OuMD, [place]    Depression 2003Dx   Edema    Food allergy    citris   FOOT PAIN, RIGHT 11/22/2008  Qualifier: Diagnosis of  By: Radene Ou MD, [place]     Gastric ulcer    GERD (gastroesophageal reflux disease)    Gout    Hypertension    took Norvasc for 3 months, doctor discontinued no longer on BP medications   Joint pain    KNEE INJURY, LEFT 01/01/2009   Qualifier: Diagnosis of  By: Amil Amen MD, Aiken Withem     Migraine    ONYCHOMYCOSIS, TOENAILS 11/22/2008   Qualifier: Diagnosis of  By: Radene Ou MD, [place]     Shortness of breath    Sleep apnea    TINEA PEDIS 11/22/2008   Qualifier: Diagnosis of   By: Radene Ou MD, Jordan Hawks      Tobacco History: Social History   Tobacco Use  Smoking Status Former   Packs/day: 0.50   Years: 18.00   Total pack years: 9.00   Types: Cigarettes   Start date: 10/13/1996   Quit date: 01/12/2015   Years since quitting: 7.9  Smokeless Tobacco Never   Counseling given: Not Answered   Outpatient Medications Prior to Visit  Medication Sig Dispense Refill   albuterol (VENTOLIN HFA) 108 (90 Base) MCG/ACT inhaler Inhale 2 puffs into the lungs every 6 (six) hours as needed for wheezing or shortness of breath. 8 g 0   aspirin-acetaminophen-caffeine (EXCEDRIN MIGRAINE) [id number] MG per tablet Take 2 tablets by mouth 2 (two) times daily as needed for migraine. For headache     buPROPion (WELLBUTRIN SR) 150 MG 12 hr tablet Take 1 tablet (150 mg total) by mouth 2 (two) times daily. 180 tablet 0   celecoxib (CELEBREX) 200 MG capsule TAKE 1 CAPSULE BY MOUTH EVERY DAY AS NEEDED 30 capsule 0   cetirizine (ZYRTEC) 10 MG tablet Take 10 mg by mouth daily.     furosemide (LASIX) 20 MG tablet TAKE 1 TABLET BY MOUTH TWICE A DAY 180 tablet 2   gabapentin (NEURONTIN) 300 MG capsule TAKE 1 CAPSULE BY MOUTH EVERY DAY 60 capsule 1   metFORMIN (GLUCOPHAGE-XR) 500 MG 24 hr tablet TAKE 1 TABLET BY MOUTH EVERY DAY WITH BREAKFAST 90 tablet 1   nystatin ointment (MYCOSTATIN) Apply 1 application. topically 2 (two) times daily. 30 g 0   pantoprazole (PROTONIX) 40 MG tablet TAKE 1 TABLET (40 MG TOTAL) BY MOUTH DAILY. TAKE 30 MINUTES PRIOR TO BREAKFAST 90 tablet 1   tiZANidine (ZANAFLEX) 4 MG tablet TAKE 1 TABLET (4 MG TOTAL) BY MOUTH EVERY 8 (EIGHT) HOURS AS NEEDED FOR MUSCLE SPASMS 30 tablet 0   topiramate (TOPAMAX) 25 MG tablet Take 1 tablet (25 mg total) by mouth daily. 30 tablet 0   Vitamin D, Ergocalciferol, (DRISDOL) 1.25 MG (50000 UNIT) CAPS capsule Take 1 capsule (50,000 Units total) by mouth every 7 (seven) days. 4 capsule 0   No facility-administered medications prior to visit.       Review of Systems  Review of Systems   Physical Exam  There were no vitals taken for this visit. Physical Exam   Lab Results:  CBC    Component Value Date/Time   WBC 11.1 (H) 07/26/2019 0750   WBC 13.3 (H) 07/03/2017 0610   RBC 4.34 07/26/2019 0750   RBC 4.33 07/03/2017 0610   HGB 12.9 07/26/2019 0750   HCT 38.1 07/26/2019 0750   PLT 219 07/26/2019 0750   MCV 88 07/26/2019 0750   MCH 29.7 07/26/2019 0750   MCH 25.9 (L) 07/03/2017 0610   MCHC 33.9 07/26/2019 0750   MCHC 32.4 07/03/2017 0610   RDW  12.9 07/26/2019 0750   LYMPHSABS 4.8 (H) 07/26/2019 0750   MONOABS 0.6 07/03/2017 0610   EOSABS 0.0 07/26/2019 0750   BASOSABS 0.0 07/26/2019 0750    BMET    Component Value Date/Time   NA 142 12/08/2022 0954   K 4.3 12/08/2022 0954   CL 105 12/08/2022 0954   CO2 22 12/08/2022 0954   GLUCOSE 77 12/08/2022 0954   GLUCOSE 83 06/04/2016 1155   BUN 16 12/08/2022 0954   CREATININE 0.89 12/08/2022 0954   CREATININE 0.90 06/04/2016 1155   CALCIUM 9.1 12/08/2022 0954   GFRNONAA 79 09/11/2020 0756   GFRNONAA 84 06/04/2016 1155   GFRAA 91 09/11/2020 0756   GFRAA >89 06/04/2016 1155    BNP No results found for: "BNP"  ProBNP No results found for: "PROBNP"  Imaging: No results found.   Assessment & Plan:   No problem-specific Assessment & Plan notes found for this encounter.     Martyn Ehrich, NP 12/10/2022

## 2022-12-12 NOTE — Telephone Encounter (Signed)
Form completed and placed in RN box

## 2022-12-12 NOTE — Telephone Encounter (Signed)
Patient called to check the status of form that was dropping off 12/08/2022. I told her it was in the doctors box, and told her to call back Monday to check on it.   Please Advise!  Thanks, [employer]

## 2022-12-15 ENCOUNTER — Ambulatory Visit (INDEPENDENT_AMBULATORY_CARE_PROVIDER_SITE_OTHER): Payer: PRIVATE HEALTH INSURANCE | Admitting: Family Medicine

## 2022-12-16 NOTE — Telephone Encounter (Signed)
Form placed up front for pick up.   Copy made for batch scanning.   Attempted to call patient, however no answer.

## 2022-12-19 ENCOUNTER — Ambulatory Visit (INDEPENDENT_AMBULATORY_CARE_PROVIDER_SITE_OTHER): Payer: No Typology Code available for payment source | Admitting: Primary Care

## 2022-12-19 ENCOUNTER — Encounter: Payer: Self-pay | Admitting: Primary Care

## 2022-12-19 VITALS — BP 118/78 | HR 89 | Ht 67.0 in | Wt >= 6400 oz

## 2022-12-19 DIAGNOSIS — G4733 Obstructive sleep apnea (adult) (pediatric): Secondary | ICD-10-CM

## 2022-12-19 MED ORDER — FAMOTIDINE 20 MG PO TABS: 20.0000 mg | ORAL_TABLET | Freq: Every day | ORAL | 3 refills | Status: DC

## 2022-12-19 NOTE — Progress Notes (Unsigned)
$'@Patient'M$  ID: Catherine [name], female    DOB: 08/30/1981, 42 y.o.   MRN: [id number]  Chief Complaint  Patient presents with   Follow-up    OSA     Referring provider: Holley Bouche, MD  HPI: 42 year old female, former smoker quit in 2016 (9-pack-year history).  Past medical history significant for OSA, asthma, migraine headaches, GERD, PCOS, insulin resistance, obesity.  Previous [facility] pulmonary encounter:  03/18/2022 Patient presents today for overdue follow-up sleep apnea. She has been off CPAP for several weeks. She lost the humidification chamber to her current CAP machine and has been unable to use since then. Since being off CPAP she reports more daytime sleepiness and headaches. She had an episode gasping for air several nights ago. Up until recently she reports moderate compliance with CPAP use, she would wear her mask on average 4-5 nights a week. She believes her pressure setting is either 9 or 11cm h20. She is working on weight loss efforts, she is on Rising Star and dose recently increased.   Sleep questionnaire Symptoms- Gasping for air, daytime sleepiness and headache  Prior sleep study- Moderate OSA on sleep study 06/05/2013 Bedtime- 7:30-8pm Time to fall asleep- 15-89mns  Nocturnal awakenings- 1-2 times Out of bed/start of day- 6:30am Weight changes- +/- 30 lbs Do you operate heavy machinery- No Do you currently wear CPAP- Yes, 9 or 11cm h20 (recently lost part to CPAP and has been unable to use) Do you current wear oxygen- No Epworth- 16  06/25/2022 Patient contacted today to review sleep study results.  Patient had PSG on 06/13/2022 that showed evidence of severe obstructive sleep apnea, average AHI 40.6/h.  AHI while supine was 69.6/h.  Minimal SpO2 77% (average oxygen 93%). Patient had loud snoring.  Reviewed sleep study results with patient.  She was on CPAP but was unable to use due to missing parts. She will need an order for new CPAP machine.   OSA: - Hx OSA, she  had a break in CPAP therapy d/t broken machine - NPSG 06/13/22 showed severe OSA, AHI 40.6/hour - DME order placed for new CPAP machine auto titrate 5 to 15 cm H2O - Advised patient aim to wear CPAP every night for min 4-6 hours or longer - Encourage weight loss efforts and side sleeping position   12/10/2022- Interim hx Patient presents today for CPAP compliance. PSG on 06/13/2022 that showed evidence of severe obstructive sleep apnea, average AHI 40.6/h.  AHI while supine was 69.6/hr.  She is moderately compliant with CPAP use. She is having issue with mask fit and leaks. She sleeps with her mouth open and saliva will accumulating in her mask causing seal to break and she will take her mask off in the middle of the night. She is interested in try nasal mask with chin strap. Otherwise she reports benefit in sleep quality with CPAP use. She is committed to continuing CPAP therapy.  [facility] download 09/20/2022 - 12/18/2022 Usage days 63/90 days (70%); 56 days (62%) greater than 4 hours Average usage total days 3 hours 26 minutes Pressure 5 to 15 cm H2O (10.4 cm H2O 95th percentile) Air leaks 37.3 L/min (95%) AHI 0.6   Allergies  Allergen Reactions   Citrus Swelling    Immunization History  Administered Date(s) Administered   Influenza Whole 07/13/2005   Influenza,inj,Quad PF,6+ Mos 07/13/2013, 07/05/2014, 07/26/2021, 07/25/2022   Influenza-Unspecified 05/28/2015, 06/25/2016, 07/01/2017, 07/17/2018   PPD Test 04/06/2019   Td 10/13/1998, 10/13/2001   Unspecified SARS-COV-2 Vaccination 09/14/2020, 10/05/2020  Past Medical History:  Diagnosis Date   Allergy    Anxiety    Arthritis    Asthma    since baby, seasonal   ASTHMA, UNSPECIFIED 12/10/2006   Qualifier: Diagnosis of  By: Eusebio Friendly     CELLULITIS AND ABSCESS OF LEG EXCEPT FOOT 12/30/2007   Qualifier: Diagnosis of  By: Hassell Done FNP, [place]     Cyst of bone    right side of spine   DEPENDENT EDEMA, LEGS, BILATERAL 11/22/2008    Qualifier: Diagnosis of  By: Radene Ou MD, [place]     Depression 2003Dx   Edema    Food allergy    citris   FOOT PAIN, RIGHT 11/22/2008   Qualifier: Diagnosis of  By: Radene Ou MD, [place]     Gastric ulcer    GERD (gastroesophageal reflux disease)    Gout    Hypertension    took Norvasc for 3 months, doctor discontinued no longer on BP medications   Joint pain    KNEE INJURY, LEFT 01/01/2009   Qualifier: Diagnosis of  By: Amil Amen MD, Eyonna Sandstrom     Migraine    ONYCHOMYCOSIS, TOENAILS 11/22/2008   Qualifier: Diagnosis of  By: Radene Ou MD, [place]     Shortness of breath    Sleep apnea    TINEA PEDIS 11/22/2008   Qualifier: Diagnosis of  By: Radene Ou MD, Jordan Hawks      Tobacco History: Social History   Tobacco Use  Smoking Status Former   Packs/day: 0.50   Years: 18.00   Total pack years: 9.00   Types: Cigarettes   Start date: 10/13/1996   Quit date: 01/12/2015   Years since quitting: 7.9  Smokeless Tobacco Never   Counseling given: Not Answered   Outpatient Medications Prior to Visit  Medication Sig Dispense Refill   albuterol (VENTOLIN HFA) 108 (90 Base) MCG/ACT inhaler Inhale 2 puffs into the lungs every 6 (six) hours as needed for wheezing or shortness of breath. 8 g 0   aspirin-acetaminophen-caffeine (EXCEDRIN MIGRAINE) [id number] MG per tablet Take 2 tablets by mouth 2 (two) times daily as needed for migraine. For headache     buPROPion (WELLBUTRIN SR) 150 MG 12 hr tablet Take 1 tablet (150 mg total) by mouth 2 (two) times daily. 180 tablet 0   celecoxib (CELEBREX) 200 MG capsule TAKE 1 CAPSULE BY MOUTH EVERY DAY AS NEEDED 30 capsule 0   cetirizine (ZYRTEC) 10 MG tablet Take 10 mg by mouth daily.     furosemide (LASIX) 20 MG tablet TAKE 1 TABLET BY MOUTH TWICE A DAY 180 tablet 2   gabapentin (NEURONTIN) 300 MG capsule TAKE 1 CAPSULE BY MOUTH EVERY DAY 60 capsule 1   metFORMIN (GLUCOPHAGE-XR) 500 MG 24 hr tablet TAKE 1 TABLET BY MOUTH EVERY DAY WITH BREAKFAST 90 tablet 1    nystatin ointment (MYCOSTATIN) Apply 1 application. topically 2 (two) times daily. 30 g 0   pantoprazole (PROTONIX) 40 MG tablet TAKE 1 TABLET (40 MG TOTAL) BY MOUTH DAILY. TAKE 30 MINUTES PRIOR TO BREAKFAST 90 tablet 1   tiZANidine (ZANAFLEX) 4 MG tablet TAKE 1 TABLET (4 MG TOTAL) BY MOUTH EVERY 8 (EIGHT) HOURS AS NEEDED FOR MUSCLE SPASMS 30 tablet 0   topiramate (TOPAMAX) 25 MG tablet Take 1 tablet (25 mg total) by mouth daily. 30 tablet 0   Vitamin D, Ergocalciferol, (DRISDOL) 1.25 MG (50000 UNIT) CAPS capsule Take 1 capsule (50,000 Units total) by mouth every 7 (seven) days. 4 capsule 0   No facility-administered medications  prior to visit.      Review of Systems  Review of Systems   Physical Exam  BP 118/78 (BP Location: Left Arm, Patient Position: Sitting, Cuff Size: Normal)   Pulse 89   Ht '5\' 7"'$  (1.702 m)   Wt (!) 451 lb (204.6 kg)   SpO2 98%   BMI 70.64 kg/m  Physical Exam   Lab Results:  CBC    Component Value Date/Time   WBC 11.1 (H) 07/26/2019 0750   WBC 13.3 (H) 07/03/2017 0610   RBC 4.34 07/26/2019 0750   RBC 4.33 07/03/2017 0610   HGB 12.9 07/26/2019 0750   HCT 38.1 07/26/2019 0750   PLT 219 07/26/2019 0750   MCV 88 07/26/2019 0750   MCH 29.7 07/26/2019 0750   MCH 25.9 (L) 07/03/2017 0610   MCHC 33.9 07/26/2019 0750   MCHC 32.4 07/03/2017 0610   RDW 12.9 07/26/2019 0750   LYMPHSABS 4.8 (H) 07/26/2019 0750   MONOABS 0.6 07/03/2017 0610   EOSABS 0.0 07/26/2019 0750   BASOSABS 0.0 07/26/2019 0750    BMET    Component Value Date/Time   NA 142 12/08/2022 0954   K 4.3 12/08/2022 0954   CL 105 12/08/2022 0954   CO2 22 12/08/2022 0954   GLUCOSE 77 12/08/2022 0954   GLUCOSE 83 06/04/2016 1155   BUN 16 12/08/2022 0954   CREATININE 0.89 12/08/2022 0954   CREATININE 0.90 06/04/2016 1155   CALCIUM 9.1 12/08/2022 0954   GFRNONAA 79 09/11/2020 0756   GFRNONAA 84 06/04/2016 1155   GFRAA 91 09/11/2020 0756   GFRAA >89 06/04/2016 1155    BNP No  results found for: "BNP"  ProBNP No results found for: "PROBNP"  Imaging: No results found.   Assessment & Plan:   OSA (obstructive sleep apnea) - - NPSG 06/13/22 showed severe OSA, AHI 40.6/hour. She is well controlled on auto CPAP 5-15cm h20; residual AHI 0.6/hour. Average usage 3 hours 36 mins. She is having issues with mask fit. Recommend mask fitting with DME for nasal mask and chin strap. She is committed to wearing CPAP therapy, she has noticed benefit in sleep quality with use. Advised she continue to wear CPAP nightly, aim for >4 hours use a night.  FU in 4-6 weeks for compliance check after mask fitting.      Martyn Ehrich, NP 12/22/2022

## 2022-12-19 NOTE — Patient Instructions (Addendum)
Recommendations: Continue Protonix '40mg'$  daily Add pepcid '20mg'$  at bedtime Follow GERD diet Continue to wear CPAP nightly, aim for >4 hours use a night   Orders: Mask fitting with DME/ nasal mask and chin strap   Rx: Famotidine (pepcid)   Follow-up:  6 weeks virtual visit with Beth NP   Food Choices for Gastroesophageal Reflux Disease, Adult When you have gastroesophageal reflux disease (GERD), the foods you eat and your eating habits are very important. Choosing the right foods can help ease your discomfort. Think about working with a food expert (dietitian) to help you make good choices. What are tips for following this plan? Reading food labels Look for foods that are low in saturated fat. Foods that may help with your symptoms include: Foods that have less than 5% of daily value (DV) of fat. Foods that have 0 grams of trans fat. Cooking Do not fry your food. Cook your food by baking, steaming, grilling, or broiling. These are all methods that do not need a lot of fat for cooking. To add flavor, try to use herbs that are low in spice and acidity. Meal planning  Choose healthy foods that are low in fat, such as: Fruits and vegetables. Whole grains. Low-fat dairy products. Lean meats, fish, and poultry. Eat small meals often instead of eating 3 large meals each day. Eat your meals slowly in a place where you are relaxed. Avoid bending over or lying down until 2-3 hours after eating. Limit high-fat foods such as fatty meats or fried foods. Limit your intake of fatty foods, such as oils, butter, and shortening. Avoid the following as told by your doctor: Foods that cause symptoms. These may be different for different people. Keep a food diary to keep track of foods that cause symptoms. Alcohol. Drinking a lot of liquid with meals. Eating meals during the 2-3 hours before bed. Lifestyle Stay at a healthy weight. Ask your doctor what weight is healthy for you. If you need to lose  weight, work with your doctor to do so safely. Exercise for at least 30 minutes on 5 or more days each week, or as told by your doctor. Wear loose-fitting clothes. Do not smoke or use any products that contain nicotine or tobacco. If you need help quitting, ask your doctor. Sleep with the head of your bed higher than your feet. Use a wedge under the mattress or blocks under the bed frame to raise the head of the bed. Chew sugar-free gum after meals. What foods should eat?  Eat a healthy, well-balanced diet of fruits, vegetables, whole grains, low-fat dairy products, lean meats, fish, and poultry. Each person is different. Foods that may cause symptoms in one person may not cause any symptoms in another person. Work with your doctor to find foods that are safe for you. The items listed above may not be a complete list of what you can eat and drink. Contact a food expert for more options. What foods should I avoid? Limiting some of these foods may help in managing the symptoms of GERD. Everyone is different. Talk with a food expert or your doctor to help you find the exact foods to avoid, if any. Fruits Any fruits prepared with added fat. Any fruits that cause symptoms. For some people, this may include citrus fruits, such as oranges, grapefruit, pineapple, and lemons. Vegetables Deep-fried vegetables. [place] fries. Any vegetables prepared with added fat. Any vegetables that cause symptoms. For some people, this may include tomatoes and tomato  products, chili peppers, onions and garlic, and horseradish. Grains Pastries or quick breads with added fat. Meats and other proteins High-fat meats, such as fatty beef or pork, hot dogs, ribs, ham, sausage, salami, and bacon. Fried meat or protein, including fried fish and fried chicken. Nuts and nut butters, in large amounts. Dairy Whole milk and chocolate milk. Sour cream. Cream. Ice cream. Cream cheese. Milkshakes. Fats and oils Butter. Margarine.  Shortening. Ghee. Beverages Coffee and tea, with or without caffeine. Carbonated beverages. Sodas. Energy drinks. Fruit juice made with acidic fruits, such as orange or grapefruit. Tomato juice. Alcoholic drinks. Sweets and desserts Chocolate and cocoa. Donuts. Seasonings and condiments Pepper. Peppermint and spearmint. Added salt. Any condiments, herbs, or seasonings that cause symptoms. For some people, this may include curry, hot sauce, or vinegar-based salad dressings. The items listed above may not be a complete list of what you should not eat and drink. Contact a food expert for more options. Questions to ask your doctor Diet and lifestyle changes are often the first steps that are taken to manage symptoms of GERD. If diet and lifestyle changes do not help, talk with your doctor about taking medicines. Where to find more information [employer] for Gastrointestinal [employer]: aboutgerd.org Summary When you have GERD, food and lifestyle choices are very important in easing your symptoms. Eat small meals often instead of 3 large meals a day. Eat your meals slowly and in a place where you are relaxed. Avoid bending over or lying down until 2-3 hours after eating. Limit high-fat foods such as fatty meats or fried foods. This information is not intended to replace advice given to you by your health care provider. Make sure you discuss any questions you have with your health care provider. Document Revised: 04/09/2020 Document Reviewed: 04/09/2020 Elsevier Patient Education  [place].

## 2022-12-22 ENCOUNTER — Telehealth: Payer: Self-pay | Admitting: Primary Care

## 2022-12-22 MED ORDER — FAMOTIDINE 20 MG PO TABS: 20.0000 mg | ORAL_TABLET | Freq: Every day | ORAL | 3 refills | Status: DC

## 2022-12-22 NOTE — Assessment & Plan Note (Addendum)
- -   NPSG 06/13/22 showed severe OSA, AHI 40.6/hour. She is well controlled on auto CPAP 5-15cm h20; residual AHI 0.6/hour. Average usage 3 hours 36 mins. She is having issues with mask fit. Recommend mask fitting with DME for nasal mask and chin strap. She is committed to wearing CPAP therapy, she has noticed benefit in sleep quality with use. Advised she continue to wear CPAP nightly, aim for >4 hours use a night.  FU in 4-6 weeks for compliance check after mask fitting.

## 2022-12-22 NOTE — Telephone Encounter (Signed)
ATC patient. LVMTCB. Seen the rx for pepcid was sent on march 8th. Resent Rx to the patients pharmacy.

## 2022-12-22 NOTE — Addendum Note (Signed)
Addended by: June Leap on: 12/22/2022 09:48 AM   Modules accepted: Orders

## 2022-12-22 NOTE — Telephone Encounter (Signed)
Patient checking on the RX for Pepcid. States pharmacy does not have RX. Pharmacy is [facility] [address]. Patient phone number is [phone].

## 2022-12-22 NOTE — Progress Notes (Unsigned)
Chief Complaint:   OBESITY Catherine [name] is here to discuss her progress with her obesity treatment plan along with follow-up of her obesity related diagnoses. Catherine [name] is on the Category 4 Plan and states she is following her eating plan approximately 85% of the time. Catherine [name] states she is not exercising.  Today's visit was #: 43 Starting weight: 468 lbs Starting date: 10/27/2016 Today's weight: 448 lbs Today's date: 12/08/2022 Total lbs lost to date: 20 lbs Total lbs lost since last in-office visit: +5 lbs  Interim History: Patient is trying to keep her head above water the last few weeks- taking it one day at a time. She has been trying to follow Cat 4 most of the time. She has switched to the yogurt option for breakfast and thinks perhaps she made a few slips with some of the microwave meals. She did the prior authorization appeal and states that her insurance is likely not going to cover any medication for anti obesity medications.   Subjective:   1. Transaminitis Patient last LFTs within normal limits.  2. Vitamin D deficiency Patient is prescription vitamin D.  Patient complains of fatigue.  3. Insulin resistance Patient is on metformin currently.  No GI side effects noted.  Assessment/Plan:   1. Transaminitis Check labs today.  - Comprehensive metabolic panel  2. Vitamin D deficiency Check labs today.  - VITAMIN D 25 Hydroxy (Vit-D Deficiency, Fractures)  Refill- Vitamin D, Ergocalciferol, (DRISDOL) 1.25 MG (50000 UNIT) CAPS capsule; Take 1 capsule (50,000 Units total) by mouth every 7 (seven) days.  Dispense: 4 capsule; Refill: 0  3. Insulin resistance Check labs today.  - Hemoglobin A1c - Insulin, random  4. BMI 70 and over, adult ([place])  5. Obesity with starting BMI of 75.5 Catherine [name] is currently in the action stage of change. As such, her goal is to continue with weight loss efforts. She has agreed to the Category 4 Plan.   Exercise goals: All adults  should avoid inactivity. Some physical activity is better than none, and adults who participate in any amount of physical activity gain some health benefits.  Behavioral modification strategies: increasing lean protein intake, meal planning and cooking strategies, better snacking choices, and planning for success.  Catherine [name] has agreed to follow-up with our clinic in 4 weeks. She was informed of the importance of frequent follow-up visits to maximize her success with intensive lifestyle modifications for her multiple health conditions.   Catherine [name] was informed we would discuss her lab results at her next visit unless there is a critical issue that needs to be addressed sooner. Catherine [name] agreed to keep her next visit at the agreed upon time to discuss these results.  Objective:   Blood pressure 134/79, pulse 91, temperature 97.9 F (36.6 C), height '5\' 7"'$  (1.702 m), weight (!) 448 lb (203.2 kg), SpO2 98 %. Body mass index is 70.17 kg/m.  General: Cooperative, alert, well developed, in no acute distress. HEENT: Conjunctivae and lids unremarkable. Cardiovascular: Regular rhythm.  Lungs: Normal work of breathing. Neurologic: No focal deficits.   Lab Results  Component Value Date   CREATININE 0.89 12/08/2022   BUN 16 12/08/2022   NA 142 12/08/2022   K 4.3 12/08/2022   CL 105 12/08/2022   CO2 22 12/08/2022   Lab Results  Component Value Date   ALT 13 12/08/2022   AST 17 12/08/2022   ALKPHOS 57 12/08/2022   BILITOT 0.5 12/08/2022   Lab Results  Component Value Date  HGBA1C 5.8 (H) 12/08/2022   HGBA1C 5.5 01/13/2022   HGBA1C 5.3 06/20/2021   HGBA1C 5.4 09/11/2020   HGBA1C 5.4 03/06/2020   Lab Results  Component Value Date   INSULIN 36.9 (H) 12/08/2022   INSULIN 39.8 (H) 01/13/2022   INSULIN 29.4 (H) 06/20/2021   INSULIN 43.4 (H) 09/11/2020   INSULIN 39.2 (H) 03/06/2020   Lab Results  Component Value Date   TSH 2.670 01/13/2022   Lab Results  Component Value Date   CHOL  146 07/26/2019   HDL 46 07/26/2019   LDLCALC 90 07/26/2019   TRIG 45 07/26/2019   CHOLHDL 3.6 11/21/2015   Lab Results  Component Value Date   VD25OH 37.4 12/08/2022   VD25OH 49.8 01/13/2022   VD25OH 38.6 06/20/2021   Lab Results  Component Value Date   WBC 11.1 (H) 07/26/2019   HGB 12.9 07/26/2019   HCT 38.1 07/26/2019   MCV 88 07/26/2019   PLT 219 07/26/2019   Lab Results  Component Value Date   IRON 25 (L) 05/18/2017   TIBC 363 05/18/2017   FERRITIN 16 05/18/2017   Attestation Statements:   Reviewed by clinician on day of visit: allergies, medications, problem list, medical history, surgical history, family history, social history, and previous encounter notes.  I, Davy Pique, RMA, am acting as transcriptionist for Coralie Common, MD. I have reviewed the above documentation for accuracy and completeness, and I agree with the above. - Coralie Common, MD

## 2022-12-22 NOTE — Telephone Encounter (Signed)
Read msg to PT and she expressed understanding.

## 2022-12-23 MED ORDER — FAMOTIDINE 20 MG PO TABS: 20.0000 mg | ORAL_TABLET | Freq: Every day | ORAL | 3 refills | Status: DC

## 2022-12-25 ENCOUNTER — Encounter: Payer: Self-pay | Admitting: Student

## 2022-12-26 ENCOUNTER — Other Ambulatory Visit: Payer: Self-pay | Admitting: Primary Care

## 2023-01-05 ENCOUNTER — Ambulatory Visit (INDEPENDENT_AMBULATORY_CARE_PROVIDER_SITE_OTHER): Payer: PRIVATE HEALTH INSURANCE | Admitting: Family Medicine

## 2023-01-18 ENCOUNTER — Other Ambulatory Visit: Payer: Self-pay | Admitting: Student

## 2023-01-27 ENCOUNTER — Ambulatory Visit (INDEPENDENT_AMBULATORY_CARE_PROVIDER_SITE_OTHER): Payer: PRIVATE HEALTH INSURANCE | Admitting: Family Medicine

## 2023-01-27 ENCOUNTER — Encounter (INDEPENDENT_AMBULATORY_CARE_PROVIDER_SITE_OTHER): Payer: Self-pay | Admitting: Family Medicine

## 2023-01-27 VITALS — BP 134/83 | HR 97 | Temp 97.9°F | Ht 67.0 in | Wt >= 6400 oz

## 2023-01-27 DIAGNOSIS — Z6841 Body Mass Index (BMI) 40.0 and over, adult: Secondary | ICD-10-CM

## 2023-01-27 DIAGNOSIS — R632 Polyphagia: Secondary | ICD-10-CM | POA: Diagnosis not present

## 2023-01-27 DIAGNOSIS — E559 Vitamin D deficiency, unspecified: Secondary | ICD-10-CM | POA: Diagnosis not present

## 2023-01-27 DIAGNOSIS — E669 Obesity, unspecified: Secondary | ICD-10-CM

## 2023-01-27 MED ORDER — VITAMIN D (ERGOCALCIFEROL) 1.25 MG (50000 UNIT) PO CAPS: 50000.0000 [IU] | ORAL_CAPSULE | ORAL | 0 refills | Status: DC

## 2023-01-27 NOTE — Progress Notes (Signed)
Chief Complaint:   OBESITY [place] is here to discuss her progress with her obesity treatment plan along with follow-up of her obesity related diagnoses. Adel is on the Category 4 Plan and states she is following her eating plan approximately 70% of the time. Cleona states she is not exercising.   Today's visit was #: 100 Starting weight: 468 lbs Starting date: 10/27/2016 Today's weight: 452 lbs Today's date: 01/27/2023 Total lbs lost to date: 16 lbs Total lbs lost since last in-office visit: +4 lbs  Interim History: Patient is in quite a bit of pain today.  She is wondering if her bursitis is flaring in her hip. She is trying to follow meal plan as much as she can.  She tries to get all her food in and isn't having many issues in food choices.  Wondering about other breakfast options and wondering what her calorie and protein range she needs for that meal. She is wondering about Ensure protein shakes. Patient is maybe doing something for her wife's birthday.   Subjective:   1. Vitamin D deficiency Patient is on prescription Vitamin D.  Patient denies nausea, vomiting, muscle weakness, but is positive for fatigue. Last level end of February 2024.  2. Polyphagia Patient voices she really has not noticed Topiramate helping.  No side effects noted.   Assessment/Plan:   1. Vitamin D deficiency Refill - Vitamin D, Ergocalciferol, (DRISDOL) 1.25 MG (50000 UNIT) CAPS capsule; Take 1 capsule (50,000 Units total) by mouth every 7 (seven) days.  Dispense: 4 capsule; Refill: 0  2. Polyphagia Stop Topiramate.  3. BMI 70 and over, adult  4. Obesity with starting BMI of 75.5 Maykayla is currently in the action stage of change. As such, her goal is to continue with weight loss efforts. She has agreed to keeping a food journal and adhering to recommended goals of 1800 calories and 125+ protein.   Exercise goals: All adults should avoid inactivity. Some physical activity is better than  none, and adults who participate in any amount of physical activity gain some health benefits.  Behavioral modification strategies: increasing lean protein intake, meal planning and cooking strategies, keeping healthy foods in the home, and keeping a strict food journal.  Shavonta has agreed to follow-up with our clinic in 4 weeks. She was informed of the importance of frequent follow-up visits to maximize her success with intensive lifestyle modifications for her multiple health conditions.   Objective:   Blood pressure 134/83, pulse 97, temperature 97.9 F (36.6 C), height  (1.702 m), weight (!) 452 lb (205 kg), SpO2 96 %. Body mass index is 70.79 kg/m.  General: Cooperative, alert, well developed, in no acute distress. HEENT: Conjunctivae and lids unremarkable. Cardiovascular: Regular rhythm.  Lungs: Normal work of breathing. Neurologic: No focal deficits.   Lab Results  Component Value Date   CREATININE 0.89 12/08/2022   BUN 16 12/08/2022   NA 142 12/08/2022   K 4.3 12/08/2022   CL 105 12/08/2022   CO2 22 12/08/2022   Lab Results  Component Value Date   ALT 13 12/08/2022   AST 17 12/08/2022   ALKPHOS 57 12/08/2022   BILITOT 0.5 12/08/2022   Lab Results  Component Value Date   HGBA1C 5.8 (H) 12/08/2022   HGBA1C 5.5 01/13/2022   HGBA1C 5.3 06/20/2021   HGBA1C 5.4 09/11/2020   HGBA1C 5.4 03/06/2020   Lab Results  Component Value Date   INSULIN 36.9 (H) 12/08/2022   INSULIN 39.8 (H)  01/13/2022   INSULIN 29.4 (H) 06/20/2021   INSULIN 43.4 (H) 09/11/2020   INSULIN 39.2 (H) 03/06/2020   Lab Results  Component Value Date   TSH 2.670 01/13/2022   Lab Results  Component Value Date   CHOL 146 07/26/2019   HDL 46 07/26/2019   LDLCALC 90 07/26/2019   TRIG 45 07/26/2019   CHOLHDL 3.6 11/21/2015   Lab Results  Component Value Date   VD25OH 37.4 12/08/2022   VD25OH 49.8 01/13/2022   VD25OH 38.6 06/20/2021   Lab Results  Component Value Date   WBC 11.1  (H) 07/26/2019   HGB 12.9 07/26/2019   HCT 38.1 07/26/2019   MCV 88 07/26/2019   PLT 219 07/26/2019   Lab Results  Component Value Date   IRON 25 (L) 05/18/2017   TIBC 363 05/18/2017   FERRITIN 16 05/18/2017   Attestation Statements:   Reviewed by clinician on day of visit: allergies, medications, problem list, medical history, surgical history, family history, social history, and previous encounter notes.  I, Malcolm Metro, RMA, am acting as transcriptionist for Reuben Likes, MD.  I have reviewed the above documentation for accuracy and completeness, and I agree with the above. - Reuben Likes, MD

## 2023-02-02 ENCOUNTER — Telehealth (INDEPENDENT_AMBULATORY_CARE_PROVIDER_SITE_OTHER): Payer: No Typology Code available for payment source | Admitting: Primary Care

## 2023-02-02 DIAGNOSIS — G4733 Obstructive sleep apnea (adult) (pediatric): Secondary | ICD-10-CM

## 2023-02-02 NOTE — Progress Notes (Signed)
Reviewed and agree with assessment/plan.   Coralyn Helling, MD [facility] Pulmonary/Critical Care 02/02/2023, 8:51 AM Pager:  [phone]

## 2023-02-02 NOTE — Patient Instructions (Signed)
Continue to wear CPAP nightly 4-6 hours Follow-up in 6 months

## 2023-02-02 NOTE — Progress Notes (Signed)
Virtual Visit via Video Note  I connected with Catherine [name] on 02/02/23 at  8:30 AM EDT by a video enabled telemedicine application and verified that I am speaking with the correct person using two identifiers.  Location: Patient: Home Provider: Office   I discussed the limitations of evaluation and management by telemedicine and the availability of in person appointments. The patient expressed understanding and agreed to proceed.  History of Present Illness: 42 year old female, former smoker quit in 2016 (9-pack-year history).  Past medical history significant for OSA, asthma, migraine headaches, GERD, PCOS, insulin resistance, obesity.  Previous [facility] pulmonary encounter:  03/18/2022 Patient presents today for overdue follow-up sleep apnea. She has been off CPAP for several weeks. She lost the humidification chamber to her current CAP machine and has been unable to use since then. Since being off CPAP she reports more daytime sleepiness and headaches. She had an episode gasping for air several nights ago. Up until recently she reports moderate compliance with CPAP use, she would wear her mask on average 4-5 nights a week. She believes her pressure setting is either 9 or 11cm h20. She is working on weight loss efforts, she is on Veguita and dose recently increased.   Sleep questionnaire Symptoms- Gasping for air, daytime sleepiness and headache  Prior sleep study- Moderate OSA on sleep study 06/05/2013 Bedtime- 7:30-8pm Time to fall asleep- 15-55mins  Nocturnal awakenings- 1-2 times Out of bed/start of day- 6:30am Weight changes- +/- 30 lbs Do you operate heavy machinery- No Do you currently wear CPAP- Yes, 9 or 11cm h20 (recently lost part to CPAP and has been unable to use) Do you current wear oxygen- No [place]- 16  06/25/2022 Patient contacted today to review sleep study results.  Patient had PSG on 06/13/2022 that showed evidence of severe obstructive sleep apnea, average AHI 40.6/h.   AHI while supine was 69.6/h.  Minimal SpO2 77% (average oxygen 93%). Patient had loud snoring.  Reviewed sleep study results with patient.  She was on CPAP but was unable to use due to missing parts. She will need an order for new CPAP machine.  OSA: - Hx OSA, she had a break in CPAP therapy d/t broken machine - NPSG 06/13/22 showed severe OSA, AHI 40.6/hour - DME order placed for new CPAP machine auto titrate 5 to 15 cm H2O - Advised patient aim to wear CPAP every night for min 4-6 hours or longer - Encourage weight loss efforts and side sleeping position   12/10/2022 Patient presents today for CPAP compliance. PSG on 06/13/2022 that showed evidence of severe obstructive sleep apnea, average AHI 40.6/h (AHI while supine was 69.6/hr).  She is moderately compliant with CPAP use. She is having issue with mask fit and leaks. She sleeps with her mouth open and saliva will accumulating in her mask causing seal to break and she will take her mask off in the middle of the night. She is interested in try nasal mask with chin strap. Otherwise she reports benefit in sleep quality with CPAP use. She is committed to continuing CPAP therapy.  Airview download 09/20/2022 - 12/18/2022 Usage days 63/90 days (70%); 56 days (62%) greater than 4 hours Average usage total days 3 hours 26 minutes Pressure 5 to 15 cm H2O (10.4 cm H2O 95th percentile) Air leaks 37.3 L/min (95%) AHI 0.6  02/02/2023- interim hx  Patient contacted today for CPAP compliance. PSG on 06/13/2022 that showed evidence of severe obstructive sleep apnea, average AHI 40.6/h (AHI while supine  was 69.6/hr). She is maintained on auto CPAP. She was having issues with mask fit, she is completed to wearing CPAP and notices benefit from use. She was sent for mask fitting for nasal mask and chin strap.   She did not tolerate nasal pillow mask. Nasal mask caused irritation to her nostrils and the head gear causes sores behind her ears. She is back wearing full face  mask and tolerating this better. She is 80% compliant with CPAP use over the last 30 days.   Airview download 12/31/22- 01/29/23 25/30 days (83%); 24 days (80%) > 4 hours Average usage 4 hours 12 mins Pressure 5-15cm h20 (10.2cm h20-95%)  AHI 1.0   Observations/Objective:  Appears well without overt respiratory symptoms   Assessment and Plan:  OSA: - PSG on 06/13/2022 >> severe obstructive sleep apnea, average AHI 40.6/h (AHI while supine was 69.6/hr) - Tolerating CPAP better, using full face mask. Wearing average 4 hours 12 mins.  - Pressure 5-15cm h20; Residual AHI 1.0 - No changes today  - Continue to encourage patient aim to wear CPAP nightly for 4 to 6 hours or longer; Work on [employer] loss efforts   Follow Up Instructions:   - 6 months with BETH NP for CPAP compliance   I discussed the assessment and treatment plan with the patient. The patient was provided an opportunity to ask questions and all were answered. The patient agreed with the plan and demonstrated an understanding of the instructions.   The patient was advised to call back or seek an in-person evaluation if the symptoms worsen or if the condition fails to improve as anticipated.  I provided 18 minutes of non-face-to-face time during this encounter.   Glenford Bayley, NP

## 2023-02-05 ENCOUNTER — Other Ambulatory Visit: Payer: Self-pay | Admitting: Family Medicine

## 2023-02-16 ENCOUNTER — Other Ambulatory Visit: Payer: Self-pay | Admitting: Student

## 2023-02-17 ENCOUNTER — Other Ambulatory Visit: Payer: Self-pay | Admitting: Student

## 2023-02-17 DIAGNOSIS — R609 Edema, unspecified: Secondary | ICD-10-CM

## 2023-02-17 DIAGNOSIS — M79605 Pain in left leg: Secondary | ICD-10-CM

## 2023-02-24 ENCOUNTER — Ambulatory Visit (INDEPENDENT_AMBULATORY_CARE_PROVIDER_SITE_OTHER): Payer: PRIVATE HEALTH INSURANCE | Admitting: Family Medicine

## 2023-02-24 ENCOUNTER — Encounter (INDEPENDENT_AMBULATORY_CARE_PROVIDER_SITE_OTHER): Payer: Self-pay | Admitting: Family Medicine

## 2023-02-24 VITALS — BP 127/77 | HR 77 | Temp 97.9°F | Ht 67.0 in | Wt >= 6400 oz

## 2023-02-24 DIAGNOSIS — E559 Vitamin D deficiency, unspecified: Secondary | ICD-10-CM | POA: Diagnosis not present

## 2023-02-24 DIAGNOSIS — R7303 Prediabetes: Secondary | ICD-10-CM | POA: Diagnosis not present

## 2023-02-24 DIAGNOSIS — Z6841 Body Mass Index (BMI) 40.0 and over, adult: Secondary | ICD-10-CM | POA: Diagnosis not present

## 2023-02-24 DIAGNOSIS — E669 Obesity, unspecified: Secondary | ICD-10-CM | POA: Diagnosis not present

## 2023-02-24 MED ORDER — VITAMIN D (ERGOCALCIFEROL) 1.25 MG (50000 UNIT) PO CAPS
50000.0000 [IU] | ORAL_CAPSULE | ORAL | 0 refills | Status: DC
Start: 2023-02-24 — End: 2023-03-31

## 2023-02-24 NOTE — Progress Notes (Unsigned)
Chief Complaint:   OBESITY Catherine [name] is here to discuss her progress with her obesity treatment plan along with follow-up of her obesity related diagnoses. Catherine [name] is on keeping a food journal and adhering to recommended goals of 1800 calories and 125+ grams of protein and states she is following her eating plan approximately 80% of the time. Catherine [name] states she is doing 0 minutes 0 times per week.  Today's visit was #: 101 Starting weight: 468 lbs Starting date: 10/27/2016 Today's weight: 457 lbs Today's date: 02/24/2023 Total lbs lost to date: 11 Total lbs lost since last in-office visit: 0  Interim History: Patient reports she feels she is holding a significant amount of water. Feeling okay with a little bit of pain but better than previously.  She mentions she is journaling- started journaling really well and journaled at the beginning of the day prior to eating anything then transitioned to journaling while eating.  Some days she was 300-400 calories short. She thinks she is eating enough protein daily. Thinks she is getting in 125-130g/day.  She mentions that she is eating mostly meat and vegetables (more meat than vegetables). Thinks maybe she was drinking more sodium in the form of broth.  Doesn't have much scheduled in the next few weeks.  Wants to start water aerobics.  Subjective:   1. Vitamin D deficiency Sehaj denies nausea, vomiting, or muscle weakness but she notes fatigue.  Her last vitamin D level was 37.4.  2. Prediabetes Catherine [name]'s last A1c was 5.8 and insulin 36.9.  She is on Glucophage daily.  Assessment/Plan:   1. Vitamin D deficiency We will refill prescription vitamin D at 50,000 IU once weekly for 1 month.  - Vitamin D, Ergocalciferol, (DRISDOL) 1.25 MG (50000 UNIT) CAPS capsule; Take 1 capsule (50,000 Units total) by mouth every 7 (seven) days.  Dispense: 4 capsule; Refill: 0  2. Prediabetes Catherine [name] will continue Glucophage, with no refills needed.  3.  BMI 70 and over, adult (HCC)  4. Obesity with starting BMI of 75.5 Catherine [name] is currently in the action stage of change. As such, her goal is to continue with weight loss efforts. She has agreed to the Category 4 Plan and keeping a food journal and adhering to recommended goals of 1800 calories and 125+ grams of protein daily.   Exercise goals: Patient is to start water aerobics, going to go to [employer].   Behavioral modification strategies: increasing lean protein intake, meal planning and cooking strategies, keeping healthy foods in the home, and planning for success.  Catherine [name] has agreed to follow-up with our clinic in 4 weeks. She was informed of the importance of frequent follow-up visits to maximize her success with intensive lifestyle modifications for her multiple health conditions.   Objective:   Blood pressure 127/77, pulse 77, temperature 97.9 F (36.6 C), height 5\' 7"  (1.702 m), weight (!) 457 lb (207.3 kg), SpO2 98 %. Body mass index is 71.58 kg/m.  General: Cooperative, alert, well developed, in no acute distress. HEENT: Conjunctivae and lids unremarkable. Cardiovascular: Regular rhythm.  Lungs: Normal work of breathing. Neurologic: No focal deficits.   Lab Results  Component Value Date   CREATININE 0.89 12/08/2022   BUN 16 12/08/2022   NA 142 12/08/2022   K 4.3 12/08/2022   CL 105 12/08/2022   CO2 22 12/08/2022   Lab Results  Component Value Date   ALT 13 12/08/2022   AST 17 12/08/2022   ALKPHOS 57 12/08/2022   BILITOT 0.5 12/08/2022  Lab Results  Component Value Date   HGBA1C 5.8 (H) 12/08/2022   HGBA1C 5.5 01/13/2022   HGBA1C 5.3 06/20/2021   HGBA1C 5.4 09/11/2020   HGBA1C 5.4 03/06/2020   Lab Results  Component Value Date   INSULIN 36.9 (H) 12/08/2022   INSULIN 39.8 (H) 01/13/2022   INSULIN 29.4 (H) 06/20/2021   INSULIN 43.4 (H) 09/11/2020   INSULIN 39.2 (H) 03/06/2020   Lab Results  Component Value Date   TSH 2.670 01/13/2022   Lab Results   Component Value Date   CHOL 146 07/26/2019   HDL 46 07/26/2019   LDLCALC 90 07/26/2019   TRIG 45 07/26/2019   CHOLHDL 3.6 11/21/2015   Lab Results  Component Value Date   VD25OH 37.4 12/08/2022   VD25OH 49.8 01/13/2022   VD25OH 38.6 06/20/2021   Lab Results  Component Value Date   WBC 11.1 (H) 07/26/2019   HGB 12.9 07/26/2019   HCT 38.1 07/26/2019   MCV 88 07/26/2019   PLT 219 07/26/2019   Lab Results  Component Value Date   IRON 25 (L) 05/18/2017   TIBC 363 05/18/2017   FERRITIN 16 05/18/2017   Attestation Statements:   Reviewed by clinician on day of visit: allergies, medications, problem list, medical history, surgical history, family history, social history, and previous encounter notes.   I, Burt Knack, am acting as transcriptionist for Reuben Likes, MD.  I have reviewed the above documentation for accuracy and completeness, and I agree with the above. - Reuben Likes, MD

## 2023-02-25 ENCOUNTER — Other Ambulatory Visit: Payer: Self-pay | Admitting: Student

## 2023-02-25 DIAGNOSIS — M7061 Trochanteric bursitis, right hip: Secondary | ICD-10-CM

## 2023-03-24 ENCOUNTER — Other Ambulatory Visit (INDEPENDENT_AMBULATORY_CARE_PROVIDER_SITE_OTHER): Payer: Self-pay | Admitting: Family Medicine

## 2023-03-24 DIAGNOSIS — E559 Vitamin D deficiency, unspecified: Secondary | ICD-10-CM

## 2023-03-30 ENCOUNTER — Other Ambulatory Visit: Payer: Self-pay | Admitting: Student

## 2023-03-31 ENCOUNTER — Ambulatory Visit (INDEPENDENT_AMBULATORY_CARE_PROVIDER_SITE_OTHER): Payer: No Typology Code available for payment source | Admitting: Family Medicine

## 2023-03-31 ENCOUNTER — Encounter (INDEPENDENT_AMBULATORY_CARE_PROVIDER_SITE_OTHER): Payer: Self-pay | Admitting: Family Medicine

## 2023-03-31 VITALS — BP 126/83 | HR 87 | Temp 98.4°F | Ht 67.0 in | Wt >= 6400 oz

## 2023-03-31 DIAGNOSIS — Z6841 Body Mass Index (BMI) 40.0 and over, adult: Secondary | ICD-10-CM

## 2023-03-31 DIAGNOSIS — E669 Obesity, unspecified: Secondary | ICD-10-CM

## 2023-03-31 DIAGNOSIS — E559 Vitamin D deficiency, unspecified: Secondary | ICD-10-CM | POA: Diagnosis not present

## 2023-03-31 DIAGNOSIS — R7303 Prediabetes: Secondary | ICD-10-CM

## 2023-03-31 MED ORDER — VITAMIN D (ERGOCALCIFEROL) 1.25 MG (50000 UNIT) PO CAPS: 50000.0000 [IU] | ORAL_CAPSULE | ORAL | 0 refills | Status: DC

## 2023-03-31 NOTE — Progress Notes (Signed)
Chief Complaint:   OBESITY Catherine [name] is here to discuss her progress with her obesity treatment plan along with follow-up of her obesity related diagnoses. Catherine [name] is on the Category 4 Plan and keeping a food journal and adhering to recommended goals of 1800 calories and 125+ grams of protein and states she is following her eating plan approximately 85% of the time. Catherine [name] states she is walking 1/2 mile for 30 minutes 4 times per week.  Today's visit was #: 102 Starting weight: 468 lbs Starting date: 10/27/2016 Today's weight: 451 lbs Today's date: 03/31/2023 Total lbs lost to date: 17 Total lbs lost since last in-office visit: 6  Interim History: Patient has started walking since last appointment and worked up to half a mile.  She has journaled consistently and getting in 1800 calories (some days she is around 1750).  Protein intake she has been hitting goal at lunch.  She notices that chicken is very high in protein. No plans for the next few weeks but then is staying local.   Subjective:   1. Vitamin D deficiency Patient is on prescription vitamin D.  Her last vitamin D level was of 37.4, and she notes fatigue.  2. Prediabetes Patient's last A1c was 5.8 and insulin 36.9.  She is on metformin daily.  Assessment/Plan:   1. Vitamin D deficiency We will refill prescription vitamin D once weekly for 1 month.  - Vitamin D, Ergocalciferol, (DRISDOL) 1.25 MG (50000 UNIT) CAPS capsule; Take 1 capsule (50,000 Units total) by mouth every 7 (seven) days.  Dispense: 4 capsule; Refill: 0  2. Prediabetes Patient will continue metformin; we will repeat labs in August.  3. Adult, current BMI 72.8  4. Obesity, starting BMI 75.54 Catherine [name] is currently in the action stage of change. As such, her goal is to continue with weight loss efforts. She has agreed to keeping a food journal and adhering to recommended goals of 1800 calories and 125+ grams of protein daily.   Exercise goals: All adults  should avoid inactivity. Some physical activity is better than none, and adults who participate in any amount of physical activity gain some health benefits.  Behavioral modification strategies: increasing lean protein intake, meal planning and cooking strategies, keeping healthy foods in the home, and planning for success.  Catherine [name] has agreed to follow-up with our clinic in 4 weeks. She was informed of the importance of frequent follow-up visits to maximize her success with intensive lifestyle modifications for her multiple health conditions.   Objective:   Blood pressure 126/83, pulse 87, temperature 98.4 F (36.9 C), height 5\' 7"  (1.702 m), weight (!) 451 lb (204.6 kg), SpO2 98 %. Body mass index is 70.64 kg/m.  General: Cooperative, alert, well developed, in no acute distress. HEENT: Conjunctivae and lids unremarkable. Cardiovascular: Regular rhythm.  Lungs: Normal work of breathing. Neurologic: No focal deficits.   Lab Results  Component Value Date   CREATININE 0.89 12/08/2022   BUN 16 12/08/2022   NA 142 12/08/2022   K 4.3 12/08/2022   CL 105 12/08/2022   CO2 22 12/08/2022   Lab Results  Component Value Date   ALT 13 12/08/2022   AST 17 12/08/2022   ALKPHOS 57 12/08/2022   BILITOT 0.5 12/08/2022   Lab Results  Component Value Date   HGBA1C 5.8 (H) 12/08/2022   HGBA1C 5.5 01/13/2022   HGBA1C 5.3 06/20/2021   HGBA1C 5.4 09/11/2020   HGBA1C 5.4 03/06/2020   Lab Results  Component Value Date  INSULIN 36.9 (H) 12/08/2022   INSULIN 39.8 (H) 01/13/2022   INSULIN 29.4 (H) 06/20/2021   INSULIN 43.4 (H) 09/11/2020   INSULIN 39.2 (H) 03/06/2020   Lab Results  Component Value Date   TSH 2.670 01/13/2022   Lab Results  Component Value Date   CHOL 146 07/26/2019   HDL 46 07/26/2019   LDLCALC 90 07/26/2019   TRIG 45 07/26/2019   CHOLHDL 3.6 11/21/2015   Lab Results  Component Value Date   VD25OH 37.4 12/08/2022   VD25OH 49.8 01/13/2022   VD25OH 38.6  06/20/2021   Lab Results  Component Value Date   WBC 11.1 (H) 07/26/2019   HGB 12.9 07/26/2019   HCT 38.1 07/26/2019   MCV 88 07/26/2019   PLT 219 07/26/2019   Lab Results  Component Value Date   IRON 25 (L) 05/18/2017   TIBC 363 05/18/2017   FERRITIN 16 05/18/2017   Attestation Statements:   Reviewed by clinician on day of visit: allergies, medications, problem list, medical history, surgical history, family history, social history, and previous encounter notes.   I, Burt Knack, am acting as transcriptionist for Reuben Likes, MD.  I have reviewed the above documentation for accuracy and completeness, and I agree with the above. - Reuben Likes, MD

## 2023-04-18 ENCOUNTER — Other Ambulatory Visit (INDEPENDENT_AMBULATORY_CARE_PROVIDER_SITE_OTHER): Payer: Self-pay | Admitting: Family Medicine

## 2023-04-18 DIAGNOSIS — E559 Vitamin D deficiency, unspecified: Secondary | ICD-10-CM

## 2023-04-20 ENCOUNTER — Other Ambulatory Visit (INDEPENDENT_AMBULATORY_CARE_PROVIDER_SITE_OTHER): Payer: Self-pay | Admitting: Family Medicine

## 2023-04-20 DIAGNOSIS — E559 Vitamin D deficiency, unspecified: Secondary | ICD-10-CM

## 2023-04-27 ENCOUNTER — Other Ambulatory Visit: Payer: Self-pay | Admitting: Student

## 2023-04-29 ENCOUNTER — Encounter (INDEPENDENT_AMBULATORY_CARE_PROVIDER_SITE_OTHER): Payer: Self-pay | Admitting: Family Medicine

## 2023-04-29 ENCOUNTER — Ambulatory Visit (INDEPENDENT_AMBULATORY_CARE_PROVIDER_SITE_OTHER): Payer: No Typology Code available for payment source | Admitting: Family Medicine

## 2023-04-29 VITALS — BP 123/80 | HR 97 | Temp 98.2°F | Ht 67.0 in | Wt >= 6400 oz

## 2023-04-29 DIAGNOSIS — Z6841 Body Mass Index (BMI) 40.0 and over, adult: Secondary | ICD-10-CM

## 2023-04-29 DIAGNOSIS — E669 Obesity, unspecified: Secondary | ICD-10-CM

## 2023-04-29 DIAGNOSIS — E559 Vitamin D deficiency, unspecified: Secondary | ICD-10-CM

## 2023-04-29 DIAGNOSIS — E7849 Other hyperlipidemia: Secondary | ICD-10-CM

## 2023-04-29 MED ORDER — VITAMIN D (ERGOCALCIFEROL) 1.25 MG (50000 UNIT) PO CAPS
50000.0000 [IU] | ORAL_CAPSULE | ORAL | 0 refills | Status: DC
Start: 2023-04-29 — End: 2023-12-07

## 2023-04-29 NOTE — Progress Notes (Unsigned)
Chief Complaint:   OBESITY Catherine [name] is here to discuss her progress with her obesity treatment plan along with follow-up of her obesity related diagnoses. Catherine [name] is on keeping a food journal and adhering to recommended goals of 1800 calories and 125+ grams of protein and states she is following her eating plan approximately 85% of the time. Catherine [name] states she was walking 1 mile 7 times per week.   Today's visit was #: 103 Starting weight: 468 lbs Starting date: 10/27/2016 Today's weight: 447 lbs Today's date: 04/29/2023 Total lbs lost to date: 21 Total lbs lost since last in-office visit: 4  Interim History: Patient stayed home for July 4th and also had gastrointestinal illness that circulated thru her house.  She mentions that she has basically been journaling and has been doing more things like skinless chicken or wings.  Majority of what she is doing she is doing in her air fryer and isn't getting in any beef due to the weather. Most days she is ending around 1800 calories and 1900 calories.  For protein intake she is ending around 110-121g. No upcoming plans for the next few weeks.  She is prepping for her son to go back to school.   Subjective:   1. Vitamin D deficiency Patient is on prescription vitamin D.  She denies nausea, vomiting, or muscle weakness but notes fatigue.  2. Other hyperlipidemia Patient's last LDL was better controlled at 90.  Her HDL was still low at her last draw.  Assessment/Plan:   1. Vitamin D deficiency Patient will continue prescription vitamin D once weekly, and we will refill for 1 month.  - Vitamin D, Ergocalciferol, (DRISDOL) 1.25 MG (50000 UNIT) CAPS capsule; Take 1 capsule (50,000 Units total) by mouth every 7 (seven) days.  Dispense: 4 capsule; Refill: 0  2. Other hyperlipidemia Patient is to start walking 3 times per week and start body weight exercises 1-2 times per week.  3. BMI 70 and over, adult (HCC)  4. Obesity, starting BMI  75.54 Catherine [name] is currently in the action stage of change. As such, her goal is to continue with weight loss efforts. She has agreed to keeping a food journal and adhering to recommended goals of 1800 calories and 125+ grams of protein daily.   Exercise goals: All adults should avoid inactivity. Some physical activity is better than none, and adults who participate in any amount of physical activity gain some health benefits.  Patient is to aim to start walking again 2-3 times per week.  Behavioral modification strategies: increasing lean protein intake, meal planning and cooking strategies, keeping healthy foods in the home, planning for success, and keeping a strict food journal.  Catherine [name] has agreed to follow-up with our clinic in 4 to 5 weeks. She was informed of the importance of frequent follow-up visits to maximize her success with intensive lifestyle modifications for her multiple health conditions.   Objective:   Blood pressure 123/80, pulse 97, temperature 98.2 F (36.8 C), height 5\' 7"  (1.702 m), weight (!) 447 lb (202.8 kg), SpO2 100%. Body mass index is 70.01 kg/m.  General: Cooperative, alert, well developed, in no acute distress. HEENT: Conjunctivae and lids unremarkable. Cardiovascular: Regular rhythm.  Lungs: Normal work of breathing. Neurologic: No focal deficits.   Lab Results  Component Value Date   CREATININE 0.89 12/08/2022   BUN 16 12/08/2022   NA 142 12/08/2022   K 4.3 12/08/2022   CL 105 12/08/2022   CO2 22 12/08/2022  Lab Results  Component Value Date   ALT 13 12/08/2022   AST 17 12/08/2022   ALKPHOS 57 12/08/2022   BILITOT 0.5 12/08/2022   Lab Results  Component Value Date   HGBA1C 5.8 (H) 12/08/2022   HGBA1C 5.5 01/13/2022   HGBA1C 5.3 06/20/2021   HGBA1C 5.4 09/11/2020   HGBA1C 5.4 03/06/2020   Lab Results  Component Value Date   INSULIN 36.9 (H) 12/08/2022   INSULIN 39.8 (H) 01/13/2022   INSULIN 29.4 (H) 06/20/2021   INSULIN 43.4 (H)  09/11/2020   INSULIN 39.2 (H) 03/06/2020   Lab Results  Component Value Date   TSH 2.670 01/13/2022   Lab Results  Component Value Date   CHOL 146 07/26/2019   HDL 46 07/26/2019   LDLCALC 90 07/26/2019   TRIG 45 07/26/2019   CHOLHDL 3.6 11/21/2015   Lab Results  Component Value Date   VD25OH 37.4 12/08/2022   VD25OH 49.8 01/13/2022   VD25OH 38.6 06/20/2021   Lab Results  Component Value Date   WBC 11.1 (H) 07/26/2019   HGB 12.9 07/26/2019   HCT 38.1 07/26/2019   MCV 88 07/26/2019   PLT 219 07/26/2019   Lab Results  Component Value Date   IRON 25 (L) 05/18/2017   TIBC 363 05/18/2017   FERRITIN 16 05/18/2017   Attestation Statements:   Reviewed by clinician on day of visit: allergies, medications, problem list, medical history, surgical history, family history, social history, and previous encounter notes.   I, Burt Knack, am acting as transcriptionist for Reuben Likes, MD.  I have reviewed the above documentation for accuracy and completeness, and I agree with the above. - Reuben Likes, MD

## 2023-05-07 ENCOUNTER — Ambulatory Visit (INDEPENDENT_AMBULATORY_CARE_PROVIDER_SITE_OTHER): Payer: No Typology Code available for payment source | Admitting: Student

## 2023-05-07 ENCOUNTER — Ambulatory Visit: Payer: No Typology Code available for payment source

## 2023-05-07 VITALS — BP 140/88 | HR 100 | Ht 67.0 in | Wt >= 6400 oz

## 2023-05-07 DIAGNOSIS — L304 Erythema intertrigo: Secondary | ICD-10-CM | POA: Insufficient documentation

## 2023-05-07 MED ORDER — NEOSPORIN PLUS PAIN RELIEF MS 3.5-10000-10 EX CREA: TOPICAL_CREAM | Freq: Two times a day (BID) | CUTANEOUS | 0 refills | Status: DC

## 2023-05-07 NOTE — Patient Instructions (Signed)
It was great seeing you today.  As we discussed, -Please apply topical antibiotic to your groin area with irritation.  Apply thick layer of Vaseline or Aquaphor.  This will help protect the skin that is raw.  Please return if you develop any drainage or signs of infection.   If you have any questions or concerns, please feel free to call the clinic.   Have a wonderful day,  Dr. Darral Dash [facility] [phone]

## 2023-05-07 NOTE — Progress Notes (Signed)
    SUBJECTIVE:   CHIEF COMPLAINT / HPI:   Catherine [name] is a pleasant 42 year old female here for irritation near her groin.  Reports a small tender area on the inside of her groin and with today being day 4 she wanted to get it looked at.  Has not noticed any purulence or drainage. No fevers or chills.   PERTINENT  PMH / PSH: Morbid obesity, OSA, PCOS  OBJECTIVE:   BP (!) 140/88   Pulse 100   Ht 5\' 7"  (1.702 m)   Wt (!) 452 lb (205 kg)   SpO2 100%   BMI 70.79 kg/m   General: Pleasant, no distress Respiratory: Breathing on room air GU: Alexis, CMA present during exam chaperone.  3 to 4 cm linear erythematous, irritated area in the left groin intertriginous area.   ASSESSMENT/PLAN:   Intertrigo Symptoms most consistent with intertrigo with erythema, tenderness and irritation in the skin fold of the left groin.  Plan to treat with topical antibiotic and a layer of topical Vaseline. No concern for underlying furuncle, boil or other infectious process. Patient is to return if no improvement in symptoms     Darral Dash, DO [facility]

## 2023-05-07 NOTE — Assessment & Plan Note (Signed)
Symptoms most consistent with intertrigo with erythema, tenderness and irritation in the skin fold of the left groin.  Plan to treat with topical antibiotic and a layer of topical Vaseline. No concern for underlying furuncle, boil or other infectious process. Patient is to return if no improvement in symptoms

## 2023-05-16 ENCOUNTER — Encounter: Payer: Self-pay | Admitting: Student

## 2023-05-16 ENCOUNTER — Telehealth: Payer: No Typology Code available for payment source

## 2023-05-16 DIAGNOSIS — L304 Erythema intertrigo: Secondary | ICD-10-CM | POA: Diagnosis not present

## 2023-05-17 MED ORDER — NYSTATIN 100000 UNIT/GM EX OINT
1.0000 | TOPICAL_OINTMENT | Freq: Two times a day (BID) | CUTANEOUS | 0 refills | Status: DC
Start: 2023-05-17 — End: 2023-07-02

## 2023-05-17 NOTE — Progress Notes (Signed)
I have spent 5 minutes in review of e-visit questionnaire, review and updating patient chart, medical decision making and response to patient.  ° °Resa Rinks W Casmer Yepiz, NP ° °  °

## 2023-05-17 NOTE — Progress Notes (Signed)
E Visit for Rash  We are sorry that you are not feeling well. Here is how we plan to help!  Based upon your presentation it appears you have a fungal infection.  I have prescribed: and Nystatin cream apply to the affected area twice daily   HOME CARE:  Take cool showers and avoid direct sunlight. Apply cool compress or wet dressings. Take a bath in an oatmeal bath.  Sprinkle content of one Aveeno packet under running faucet with comfortably warm water.  Bathe for 15-20 minutes, 1-2 times daily.  Pat dry with a towel. Do not rub the rash. Use hydrocortisone cream. Take an antihistamine like Benadryl for widespread rashes that itch.  The adult dose of Benadryl is 25-50 mg by mouth 4 times daily. Caution:  This type of medication may cause sleepiness.  Do not drink alcohol, drive, or operate dangerous machinery while taking antihistamines.  Do not take these medications if you have prostate enlargement.  Read package instructions thoroughly on all medications that you take.  GET HELP RIGHT AWAY IF:  Symptoms don't go away after treatment. Severe itching that persists. If you rash spreads or swells. If you rash begins to smell. If it blisters and opens or develops a yellow-brown crust. You develop a fever. You have a sore throat. You become short of breath.  MAKE SURE YOU:  Understand these instructions. Will watch your condition. Will get help right away if you are not doing well or get worse.  Thank you for choosing an e-visit.  Your e-visit answers were reviewed by a board certified advanced clinical practitioner to complete your personal care plan. Depending upon the condition, your plan could have included both over the counter or prescription medications.  Please review your pharmacy choice. Make sure the pharmacy is open so you can pick up prescription now. If there is a problem, you may contact your provider through MyChart messaging and have the prescription routed to  another pharmacy.  Your safety is important to us. If you have drug allergies check your prescription carefully.   For the next 24 hours you can use MyChart to ask questions about today's visit, request a non-urgent call back, or ask for a work or school excuse. You will get an email in the next two days asking about your experience. I hope that your e-visit has been valuable and will speed your recovery.  

## 2023-05-28 ENCOUNTER — Ambulatory Visit (INDEPENDENT_AMBULATORY_CARE_PROVIDER_SITE_OTHER): Payer: No Typology Code available for payment source | Admitting: Family Medicine

## 2023-06-02 ENCOUNTER — Other Ambulatory Visit: Payer: Self-pay

## 2023-06-03 NOTE — Telephone Encounter (Signed)
Called patient and informed her that Celebrex cannot be filled until she is seen, patient stated that she has an appointment coming up 06/19/23 and she has enough pills to last her. Patient would like to wait until that appointment. Penni Bombard CMA

## 2023-06-05 ENCOUNTER — Other Ambulatory Visit: Payer: Self-pay

## 2023-06-07 MED ORDER — CELECOXIB 200 MG PO CAPS: 200.0000 mg | ORAL_CAPSULE | Freq: Every day | ORAL | 0 refills | Status: DC

## 2023-06-19 ENCOUNTER — Ambulatory Visit (INDEPENDENT_AMBULATORY_CARE_PROVIDER_SITE_OTHER): Payer: No Typology Code available for payment source | Admitting: Student

## 2023-06-19 ENCOUNTER — Other Ambulatory Visit: Payer: Self-pay

## 2023-06-19 ENCOUNTER — Encounter: Payer: Self-pay | Admitting: Student

## 2023-06-19 VITALS — BP 136/83 | HR 91 | Ht 67.0 in | Wt >= 6400 oz

## 2023-06-19 DIAGNOSIS — M79605 Pain in left leg: Secondary | ICD-10-CM

## 2023-06-19 DIAGNOSIS — M79604 Pain in right leg: Secondary | ICD-10-CM

## 2023-06-19 NOTE — Patient Instructions (Addendum)
It was great to see you! Thank you for allowing me to participate in your care!  I recommend that you always bring your medications to each appointment as this makes it easy to ensure we are on the correct medications and helps [name] not miss when refills are needed.  Our plans for today:  - Knee Pain Will continue Celebrex for now, with plan to stop this medication, if the lidocaine patches work  Use Salonpas lidocaine patches. Can wear for 12 hours a day as needed  Can use Voltaren Gel as needed, when not using lidocaine patches  Can continue Gabapentin  Can continue Tylenol as needed   Max dose of 3,000 mg in a day. Can take 1,000 mg every 8 hours  Follow up in 2-3 months, may need to consider knee injections      Take care and seek immediate care sooner if you develop any concerns.   Dr. Bess Kinds, MD [facility]

## 2023-06-19 NOTE — Assessment & Plan Note (Addendum)
Patient comes in for follow-up of knee pain bilaterally.  Patient notes she has had knee pain going on for a year plus, following an injury to both knees separately.  Patient uses Celebrex, gabapentin, Tylenol, ibuprofen as needed for pain relief, feels the Celebrex helps the most.  Patient has imaging of right knee that shows OA, fair to include both knees suffering from OA.  Discussed not taking Celebrex and ibuprofen together.  Will attempt to optimize pain management with topicals, and see if we can pull off Celebrex at a later date. - Lidocaine patch both knees, daily - Voltaren gel when not using lidocaine patches - Continue gabapentin - Continue Celebrex - Can take 3 g of Tylenol a day, 1 g every 8 hours - Follow-up 2 to 3 months, may consider knee injections

## 2023-06-19 NOTE — Progress Notes (Signed)
  SUBJECTIVE:   CHIEF COMPLAINT / HPI:   Pain management Has been taking taking Celebrex for year and has been taking gabapentin for a year. Sometimes has to take Tylenol and ibuprofen for pain, with the celebrex. Tricompartmental OA noted on right knee X-ray. Was on Cymbalta, but switched to gabapentin. Knee pain is stable, but sometimes it flares and get's worse. When pain is worse, she takes ibuprofen or tylenol with celebrex.  Patient reports left knee was injured while stepping off of a [employer], and right knee was injured while stepping up over a curve, she fell on both knees and caused injury.   PERTINENT  PMH / PSH:   OBJECTIVE:  BP 136/83   Pulse 91   Ht 5\' 7"  (1.702 m)   Wt (!) 456 lb 6.4 oz (207 kg)   SpO2 100%   BMI 71.48 kg/m  Physical Exam Musculoskeletal:     Right knee: Effusion present. No swelling, deformity, erythema, ecchymosis or lacerations. Normal range of motion. Tenderness present over the medial joint line.     Instability Tests: Anterior drawer test negative. Posterior drawer test negative.     Left knee: No swelling, deformity, effusion, erythema, ecchymosis or lacerations. Normal range of motion. Tenderness present over the medial joint line.     Instability Tests: Anterior drawer test negative. Posterior drawer test negative.      ASSESSMENT/PLAN:  Pain in both lower extremities Assessment & Plan: Patient comes in for follow-up of knee pain bilaterally.  Patient notes she has had knee pain going on for a year plus, following an injury to both knees separately.  Patient uses Celebrex, gabapentin, Tylenol, ibuprofen as needed for pain relief, feels the Celebrex helps the most.  Patient has imaging of right knee that shows OA, fair to include both knees suffering from OA.  Discussed not taking Celebrex and ibuprofen together.  Will attempt to optimize pain management with topicals, and see if we can pull off Celebrex at a later date. - Lidocaine patch both  knees, daily - Voltaren gel when not using lidocaine patches - Continue gabapentin - Continue Celebrex - Can take 3 g of Tylenol a day, 1 g every 8 hours - Follow-up 2 to 3 months, may consider knee injections    No follow-ups on file. Bess Kinds, MD 06/19/2023, 5:20 PM PGY-3, [facility]

## 2023-07-02 ENCOUNTER — Encounter (INDEPENDENT_AMBULATORY_CARE_PROVIDER_SITE_OTHER): Payer: Self-pay | Admitting: Family Medicine

## 2023-07-02 ENCOUNTER — Telehealth (INDEPENDENT_AMBULATORY_CARE_PROVIDER_SITE_OTHER): Payer: No Typology Code available for payment source | Admitting: Family Medicine

## 2023-07-02 DIAGNOSIS — E669 Obesity, unspecified: Secondary | ICD-10-CM | POA: Diagnosis not present

## 2023-07-02 DIAGNOSIS — R7303 Prediabetes: Secondary | ICD-10-CM

## 2023-07-02 DIAGNOSIS — L304 Erythema intertrigo: Secondary | ICD-10-CM | POA: Diagnosis not present

## 2023-07-02 DIAGNOSIS — Z6841 Body Mass Index (BMI) 40.0 and over, adult: Secondary | ICD-10-CM | POA: Diagnosis not present

## 2023-07-02 DIAGNOSIS — E66813 Body mass index (BMI) 70 or greater, adult: Secondary | ICD-10-CM

## 2023-07-02 NOTE — Progress Notes (Unsigned)
TeleHealth Visit:  Due to the COVID-19 pandemic, this visit was completed with telemedicine (audio/video) technology to reduce patient and provider exposure as well as to preserve personal protective equipment.   Catherine [name] has verbally consented to this TeleHealth visit. The patient is located at home, the provider is located at the [facility] office. The participants in this visit include the listed provider and patient. The visit was conducted today via MyChart video.   Chief Complaint: OBESITY Catherine [name] is here to discuss her progress with her obesity treatment plan along with follow-up of her obesity related diagnoses. Catherine [name] is on keeping a food journal and adhering to recommended goals of 1800 calories and 125+ grams of protein and states she is following her eating plan approximately 85% of the time. Catherine [name] states she is walking for 30 minutes 2 times per week.  Today's visit was #: 104 Starting weight: 468 lbs Starting date: 10/27/2016  Interim History: Patient is a MyChart visit due to transportation issues and fever. She has tested twice for covid and was negative.  She is doing quite a bit of mandatory overtime at work.  Prepping has feel off a bit because she has been tired.  She is keeping her food in the house in order to make it easier to get all the food she needs in.  She hasn't been as on plan as she normally is.  She does not know when the work increase will decrease.  Subjective:   1. Prediabetes Patient is on metformin, and she notes minimal carbohydrate cravings.  2. Intertrigo Patient has used nystatin which cleared up her skin breakdown.  She is still using Aquaphor as needed for dry skin.  Assessment/Plan:   1. Prediabetes Patient will continue her current meal plan of journaling.  She was encouraged to keep track in MyFitness pal to ensure she is getting enough protein daily.  2. Intertrigo We will follow-up with the patient at her next  appointment on any other further symptoms.  3. BMI 70 and over, adult (HCC)  4. Obesity, starting BMI 75.54 Catherine [name] is currently in the action stage of change. As such, her goal is to continue with weight loss efforts. She has agreed to keeping a food journal and adhering to recommended goals of 1800 calories and 125+ grams of protein daily.   Exercise goals: As is.   Behavioral modification strategies: increasing lean protein intake, meal planning and cooking strategies, keeping healthy foods in the home, and planning for success.  Catherine [name] has agreed to follow-up with our clinic in 6 weeks. She was informed of the importance of frequent follow-up visits to maximize her success with intensive lifestyle modifications for her multiple health conditions.  Objective:   VITALS: Per patient if applicable, see vitals. GENERAL: Alert and in no acute distress. CARDIOPULMONARY: No increased WOB. Speaking in clear sentences.  PSYCH: Pleasant and cooperative. Speech normal rate and rhythm. Affect is appropriate. Insight and judgement are appropriate. Attention is focused, linear, and appropriate.  NEURO: Oriented as arrived to appointment on time with no prompting.   Lab Results  Component Value Date   CREATININE 0.89 12/08/2022   BUN 16 12/08/2022   NA 142 12/08/2022   K 4.3 12/08/2022   CL 105 12/08/2022   CO2 22 12/08/2022   Lab Results  Component Value Date   ALT 13 12/08/2022   AST 17 12/08/2022   ALKPHOS 57 12/08/2022   BILITOT 0.5 12/08/2022   Lab Results  Component  Value Date   HGBA1C 5.8 (H) 12/08/2022   HGBA1C 5.5 01/13/2022   HGBA1C 5.3 06/20/2021   HGBA1C 5.4 09/11/2020   HGBA1C 5.4 03/06/2020   Lab Results  Component Value Date   INSULIN 36.9 (H) 12/08/2022   INSULIN 39.8 (H) 01/13/2022   INSULIN 29.4 (H) 06/20/2021   INSULIN 43.4 (H) 09/11/2020   INSULIN 39.2 (H) 03/06/2020   Lab Results  Component Value Date   TSH 2.670 01/13/2022   Lab Results  Component  Value Date   CHOL 146 07/26/2019   HDL 46 07/26/2019   LDLCALC 90 07/26/2019   TRIG 45 07/26/2019   CHOLHDL 3.6 11/21/2015   Lab Results  Component Value Date   VD25OH 37.4 12/08/2022   VD25OH 49.8 01/13/2022   VD25OH 38.6 06/20/2021   Lab Results  Component Value Date   WBC 11.1 (H) 07/26/2019   HGB 12.9 07/26/2019   HCT 38.1 07/26/2019   MCV 88 07/26/2019   PLT 219 07/26/2019   Lab Results  Component Value Date   IRON 25 (L) 05/18/2017   TIBC 363 05/18/2017   FERRITIN 16 05/18/2017    Attestation Statements:   Reviewed by clinician on day of visit: allergies, medications, problem list, medical history, surgical history, family history, social history, and previous encounter notes.   I, Burt Knack, am acting as transcriptionist for Reuben Likes, MD.  I have reviewed the above documentation for accuracy and completeness, and I agree with the above. - Reuben Likes, MD

## 2023-07-23 ENCOUNTER — Other Ambulatory Visit (INDEPENDENT_AMBULATORY_CARE_PROVIDER_SITE_OTHER): Payer: Self-pay | Admitting: Family Medicine

## 2023-07-23 DIAGNOSIS — E559 Vitamin D deficiency, unspecified: Secondary | ICD-10-CM

## 2023-07-26 ENCOUNTER — Other Ambulatory Visit: Payer: Self-pay | Admitting: Student

## 2023-07-30 ENCOUNTER — Other Ambulatory Visit: Payer: Self-pay | Admitting: Student

## 2023-08-10 ENCOUNTER — Ambulatory Visit (INDEPENDENT_AMBULATORY_CARE_PROVIDER_SITE_OTHER): Payer: No Typology Code available for payment source | Admitting: Family Medicine

## 2023-08-10 ENCOUNTER — Other Ambulatory Visit: Payer: Self-pay | Admitting: Medical Genetics

## 2023-08-10 DIAGNOSIS — Z006 Encounter for examination for normal comparison and control in clinical research program: Secondary | ICD-10-CM

## 2023-08-18 ENCOUNTER — Ambulatory Visit: Payer: No Typology Code available for payment source | Admitting: Student

## 2023-08-18 NOTE — Progress Notes (Deleted)
  SUBJECTIVE:   CHIEF COMPLAINT / HPI:   Left leg numbness /  Knee Injections -Seen 9/26 for bilat knee pain, w/ hx of OA     PERTINENT  PMH / PSH: ***  Past Medical History:  Diagnosis Date   Allergy    Anxiety    Arthritis    Asthma    since baby, seasonal   ASTHMA, UNSPECIFIED 12/10/2006   Qualifier: Diagnosis of  By: Abundio Miu     CELLULITIS AND ABSCESS OF LEG EXCEPT FOOT 12/30/2007   Qualifier: Diagnosis of  By: Daphine Deutscher FNP, [place]     Cyst of bone    right side of spine   DEPENDENT EDEMA, LEGS, BILATERAL 11/22/2008   Qualifier: Diagnosis of  By: Barbaraann Barthel MD, [place]     Depression [id number]   Edema    Food allergy    citris   FOOT PAIN, RIGHT 11/22/2008   Qualifier: Diagnosis of  By: Barbaraann Barthel MD, [place]     Gastric ulcer    GERD (gastroesophageal reflux disease)    Gout    Hypertension    took Norvasc for 3 months, doctor discontinued no longer on BP medications   Joint pain    KNEE INJURY, LEFT 01/01/2009   Qualifier: Diagnosis of  By: Delrae Alfred MD, [place]     Migraine    ONYCHOMYCOSIS, TOENAILS 11/22/2008   Qualifier: Diagnosis of  By: Barbaraann Barthel MD, [place]     Shortness of breath    Sleep apnea    TINEA PEDIS 11/22/2008   Qualifier: Diagnosis of  By: Barbaraann Barthel MD, [place]     OBJECTIVE:  There were no vitals taken for this visit. Physical Exam   ASSESSMENT/PLAN:   Assessment & Plan  No follow-ups on file. Bess Kinds, MD 08/18/2023, 10:22 AM PGY-***, [facility] {    This will disappear when note is signed, click to select method of visit    :1}

## 2023-08-19 ENCOUNTER — Ambulatory Visit: Payer: No Typology Code available for payment source | Admitting: Student

## 2023-08-19 NOTE — Patient Instructions (Incomplete)
It was great to see you! Thank you for allowing me to participate in your care!  I recommend that you always bring your medications to each appointment as this makes it easy to ensure we are on the correct medications and helps us not miss when refills are needed.  Our plans for today:  - *** -   We are checking some labs today, I will call you if they are abnormal will send you a MyChart message or a letter if they are normal.  If you do not hear about your labs in the next 2 weeks please let us know.***  Take care and seek immediate care sooner if you develop any concerns.   Dr. Akeiba Axelson, MD [facility]  

## 2023-08-19 NOTE — Progress Notes (Signed)
  SUBJECTIVE:   CHIEF COMPLAINT / HPI:   Left Foot Discomfort Left foot near the heel has some weird feeling, also appreciates some sharp pain that comes and goes. Also appreciates some swelling in leg since this started. No actual lesion, it just feels like something is there. Normal state of health, no fevers, or systemic symptoms. Unsure how long this has been an issue.  Denies any history of trauma  Knee pain Having Bilat knee pain and wanting injections, Is still using celebrex and had tried the lidocain patches w/o much relief.     PERTINENT  PMH / PSH:    OBJECTIVE:  BP (!) 133/91   Pulse 98   Ht 5\' 7"  (1.702 m)   Wt (!) 470 lb (213.2 kg)   SpO2 100%   BMI 73.61 kg/m  Physical Exam Musculoskeletal:     Left foot: Normal range of motion. No deformity, bunion or foot drop.       Feet:  Feet:     Left foot:     Skin integrity: No ulcer, blister, skin breakdown, erythema, warmth, callus, dry skin or fissure.     Toenail Condition: Left toenails are normal.     Comments: Sharp TTP on plantar upper heel, collapsed arches noted on exam     ASSESSMENT/PLAN:   Assessment & Plan Left foot pain Patient comes in complaint for foot pain/discomfort, with no trauma or injury.  Patient in normal state of health, no systemic symptoms, appreciating pain in left foot, where tendons attached to anterior heel.  Patient also with collapsed arches.  Given patient's exam, and symptoms, most concern for possible plantar fasciitis.  No cyst noted on palpation, and no injury to the foot, will recommend patient follow-up with sports medicine as may need arch supports/pads and shoes. - Follow-up sports med Chronic pain of right knee Patient comes in for bilateral knee pain, noted to have tricompartmental OA.  Patient has tried lidocaine patches without much relief, and takes Celebrex regularly.  Patient would like to start joint injections.  Unfortunately secondary to patient's size, we do not  carry needles long enough, will refer patient to sports med for knee injections. - Follow-up sports med No follow-ups on file. Bess Kinds, MD 08/22/2023, 10:23 PM PGY-3, [facility]

## 2023-08-20 ENCOUNTER — Ambulatory Visit: Payer: No Typology Code available for payment source | Admitting: Student

## 2023-08-20 ENCOUNTER — Ambulatory Visit (INDEPENDENT_AMBULATORY_CARE_PROVIDER_SITE_OTHER): Payer: No Typology Code available for payment source | Admitting: Student

## 2023-08-20 ENCOUNTER — Encounter: Payer: Self-pay | Admitting: Student

## 2023-08-20 VITALS — BP 133/91 | HR 98 | Ht 67.0 in | Wt >= 6400 oz

## 2023-08-20 DIAGNOSIS — M79672 Pain in left foot: Secondary | ICD-10-CM

## 2023-08-20 DIAGNOSIS — M25561 Pain in right knee: Secondary | ICD-10-CM | POA: Diagnosis not present

## 2023-08-20 DIAGNOSIS — G8929 Other chronic pain: Secondary | ICD-10-CM

## 2023-08-22 DIAGNOSIS — M79672 Pain in left foot: Secondary | ICD-10-CM | POA: Insufficient documentation

## 2023-08-22 NOTE — Assessment & Plan Note (Addendum)
Patient comes in for bilateral knee pain, noted to have tricompartmental OA.  Patient has tried lidocaine patches without much relief, and takes Celebrex regularly.  Patient would like to start joint injections.  Unfortunately secondary to patient's size, we do not carry needles long enough, will refer patient to sports med for knee injections. - Follow-up sports med

## 2023-08-22 NOTE — Assessment & Plan Note (Addendum)
Patient comes in complaint for foot pain/discomfort, with no trauma or injury.  Patient in normal state of health, no systemic symptoms, appreciating pain in left foot, where tendons attached to anterior heel.  Patient also with collapsed arches.  Given patient's exam, and symptoms, most concern for possible plantar fasciitis.  No cyst noted on palpation, and no injury to the foot, will recommend patient follow-up with sports medicine as may need arch supports/pads and shoes. - Follow-up sports med

## 2023-08-26 ENCOUNTER — Ambulatory Visit (INDEPENDENT_AMBULATORY_CARE_PROVIDER_SITE_OTHER): Payer: No Typology Code available for payment source | Admitting: Sports Medicine

## 2023-08-26 VITALS — BP 117/64 | Ht 67.0 in | Wt >= 6400 oz

## 2023-08-26 DIAGNOSIS — M2142 Flat foot [pes planus] (acquired), left foot: Secondary | ICD-10-CM

## 2023-08-26 DIAGNOSIS — M2141 Flat foot [pes planus] (acquired), right foot: Secondary | ICD-10-CM

## 2023-08-26 DIAGNOSIS — M722 Plantar fascial fibromatosis: Secondary | ICD-10-CM

## 2023-08-26 DIAGNOSIS — M170 Bilateral primary osteoarthritis of knee: Secondary | ICD-10-CM

## 2023-08-26 DIAGNOSIS — M7652 Patellar tendinitis, left knee: Secondary | ICD-10-CM

## 2023-08-26 DIAGNOSIS — M7651 Patellar tendinitis, right knee: Secondary | ICD-10-CM | POA: Diagnosis not present

## 2023-08-26 MED ORDER — METHYLPREDNISOLONE ACETATE 40 MG/ML IJ SUSP
40.0000 mg | Freq: Once | INTRAMUSCULAR | Status: AC
  Administered 2023-08-26: 40 mg via INTRA_ARTICULAR

## 2023-08-26 MED ORDER — METHYLPREDNISOLONE ACETATE 40 MG/ML IJ SUSP
40.0000 mg | Freq: Once | INTRAMUSCULAR | Status: AC
Start: 2023-08-26 — End: 2023-08-26
  Administered 2023-08-26: 40 mg via INTRA_ARTICULAR

## 2023-08-26 NOTE — Progress Notes (Signed)
PCP: Bess Kinds, MD  SUBJECTIVE:   HPI:  Patient is a 42 y.o. female here with chief complaint of bilateral knee pain and left heel pain. She states that she has had difficulty with her knees for many years. She has been using Celebrex for this and topical lidocaine patches without much relief. She is trying to increase her physical activity to work on weight loss, though the knee stiffness and pain limits her activity. She has known severe tricompartmental arthritis in both knees. Had a traumatic injury to the left knee back in 2010 after falling off of a UHAUL resulting in a partial ACL tear. This was managed non-operatively.   Regarding her left heel, this pain has been present for about 3 weeks. Pain is on the plantar surface of the heel. Worse with weight bearing after periods of inactivity. This is a new issue for her. No previous history of plantar fasciitis. She has been using some gel heel inserts the past week and feels much better presently.   ROS:     See HPI  PERTINENT  PMH / PSH FH / / SH:  Past Medical, Surgical, Social, and Family History Reviewed & Updated in the EMR.  Pertinent findings include:  Morbid Obesity Bilateral Knee OA  Allergies  Allergen Reactions   Citrus Swelling   OBJECTIVE:  BP 117/64   Ht 5\' 7"  (1.702 m)   Wt (!) 450 lb (204.1 kg)   BMI 70.48 kg/m   PHYSICAL EXAM:  GEN: Alert and Oriented, NAD, comfortable in exam room RESP: Unlabored respirations, symmetric chest rise PSY: normal mood, congruent affect   BILATERAL KNEE MSK EXAM: Exam limited by body habitus. No appreciable bony deformity, ecchymoses, swelling. TTP along bilateral joint lines in both knees, medial>lateral. TTP along patellar tendons as well. Remainder of knees non-tender. Able to fully extend to 0d bilaterally, though flexion limited to 90d. Normal strength. Negative ant/post drawers. Negative valgus/varus testing. Positive mcmurrays, unable to perform thessaly. NV  intact distally.  LEFT FOOT MSK EXAM: Bilateral feet with significant pes planus and mild pronation at rest. FROM with normal strength. Left foot with pain over the calcaneus and proximal plantar fascia + Windlass. Negative calcaneal squeeze.  Non-tender achilles, negative thompson. NVI distally.   Assessment & Plan Bilateral primary osteoarthritis of knee She has significant, functionally limited OA in bilateral knees. We discussed the benefits of weight loss, physical activity and quad strengthening exercises - she is not presently interested in PT referral. Advised continuing Celebrex PRN and tylenol fo pain. Given her pain we also discussed option of trying cortisone injections which she was interested in. These were performed as below with U/S guidance due to her body habitus. Recommended follow-up in 4-6 weeks. Patellar tendinitis of both knees Exam also consistent with patellar tendinitis. I recommended formal PT though she isn't presently interested. Will follow this at her follow-up. Can review HEP +/- nitroglycerin protocol at that time if failing to improve.  Plantar fasciitis of left foot Seems to be doing well with heel pads, though considering her pes planus may benefit from green inserts with some scaphoid padding at follow-up. We reviewed home exercises for her plantar fascia and calves. F/u in 4-6 weeks.  Pes planus of both feet Consider green inserts or custom orthotics at follow-up.   Bilateral Ultrasound Guided Knee Injection Procedure: After informed written consent timeout was performed, patient was in seated position on exam table.  Right knee was prepped with alcohol swab x2. A  linear array probe was utilized to visualized the suprapatellar recess. Small effusion noted on ultrasound in this space. Ethyl chloride spray used for topical anesthetic and in an in-plane superolateral approach an injection of 3mL of 1% lidocaine was injected as a local anesthesia track using a  25g 1.5in needle. Utilizing the same superolateral in-plane approach and a 22g 3.5in needle, the right suprapatellar recess then was injected with 3:1 lidocaine:depomedrol. Next the left knee was prepped with alcohol swab x2. The linear array probe was utilized to visualized the suprapatellar recess. Small effusion noted on ultrasound in this space. Ethyl chloride spray used for topical anesthetic and in an in-plane superolateral approach an injection of 3mL of 1% lidocaine was injected as a local anesthesia track using a 25g 1.5in needle. Utilizing the same superolateral in-plane approach and a 22g 3.5in needle, the left suprapatellar recess then was injected with 3:1 lidocaine:depomedrol. Following the injection a bandage was applied to the area. Patient tolerated procedure well without immediate complications. The patient was counseled as to the expected post-injection course, including the possibility of worsening of pain with steroid flare. Instructed as to concerning symptoms and advised to contact the office if these should arise.   Glean Salen, MD PGY-4, Sports Medicine Fellow [facility]  Addendum:  I was the preceptor for this visit and available for immediate consultation.  Norton Blizzard MD Marrianne Mood

## 2023-08-26 NOTE — Patient Instructions (Signed)
Your pain is due to arthritis in both knees. These are the different medications you can take for this: Tylenol 500mg  1-2 tabs three times a day for pain. Voltaren gel, capsaicin, aspercreme, or biofreeze topically up to four times a day may also help with pain. Some supplements that may help for arthritis: Boswellia extract, curcumin, pycnogenol Aleve 1-2 tabs twice a day with food Cortisone injections are an option, which we did today ultrasound guidance. If cortisone injections do not help, there are different types of shots that may help but they take longer to take effect. It's important that you continue to stay active. Straight leg raises, knee extensions 3 sets of 10 once a day (add ankle weight if these become too easy). Consider physical therapy to strengthen muscles around the joint that hurts to take pressure off of the joint itself. Shoe inserts with good arch support may be helpful. Heat or ice 15 minutes at a time 3-4 times a day as needed to help with pain. Water aerobics and cycling with low resistance are the best two types of exercise for arthritis though any exercise is ok as long as it doesn't worsen the pain.  You have plantar fasciitis Take tylenol and/or aleve as needed for pain  Plantar fascia stretch for 20-30 seconds (do 3 of these) in morning Lowering/raise on a step exercises 3 x 10 once or twice a day - this is very important for long term recovery. Can add heel walks, toe walks forward and backward as well Ice heel for 15 minutes as needed. Avoid flat shoes/barefoot walking as much as possible. Arch straps have been shown to help with pain. Inserts are important (dr. Jari Sportsman active series, spencos, our green insoles, custom orthotics). Steroid injection is a consideration for short term pain relief if you are struggling. Physical therapy is also an option.  Follow up with me in 6 weeks.    Today you received an injection with a corticosteroid (aka:  cortisone injection). This injection is usually done in response to pain and inflammation. There is some "numbing medicine" (Lidocaine) in the shot, so the injected area may be numb and feel really good for the next couple of hours. The numbing medicine usually wears off in 2-3 hours, and then your pain level may be back to where it was before the injection until the cortisone starts working.    The actually benefit from the steroid injection is usually noticed within 3-5 days, but may take up to 14 days. You may actually experience a small (as in 10%) INCREASE in pain in the first 24 hours---that is common.  Things to watch out for that you should contact us or a health care provider urgently would include: 1. Unusual (as in more than 10%) increase in pain 2. New fever > 101.5 3. New swelling or redness of the injected area. 4. Streaking of red lines around the area injected.  Do not hesitate to call or reach out with any questions or concerns.

## 2023-08-29 ENCOUNTER — Other Ambulatory Visit: Payer: Self-pay | Admitting: Student

## 2023-09-02 ENCOUNTER — Other Ambulatory Visit (HOSPITAL_COMMUNITY)
Admission: RE | Admit: 2023-09-02 | Discharge: 2023-09-02 | Disposition: A | Discharge status: Home / Self Care | Payer: No Typology Code available for payment source | Source: Ambulatory Visit | Attending: Oncology | Admitting: Oncology

## 2023-09-02 DIAGNOSIS — Z006 Encounter for examination for normal comparison and control in clinical research program: Secondary | ICD-10-CM | POA: Insufficient documentation

## 2023-09-08 ENCOUNTER — Encounter: Payer: Self-pay | Admitting: Student

## 2023-09-09 ENCOUNTER — Other Ambulatory Visit: Payer: Self-pay | Admitting: Student

## 2023-09-15 LAB — GENECONNECT MOLECULAR SCREEN: Genetic Analysis Overall Interpretation: NEGATIVE

## 2023-10-05 LAB — HM MAMMOGRAPHY

## 2023-10-08 ENCOUNTER — Encounter: Payer: Self-pay | Admitting: Student

## 2023-10-18 ENCOUNTER — Telehealth: Payer: No Typology Code available for payment source | Admitting: Physician Assistant

## 2023-10-18 DIAGNOSIS — R6889 Other general symptoms and signs: Secondary | ICD-10-CM

## 2023-10-18 DIAGNOSIS — J0110 Acute frontal sinusitis, unspecified: Secondary | ICD-10-CM | POA: Diagnosis not present

## 2023-10-18 MED ORDER — FLUTICASONE PROPIONATE 50 MCG/ACT NA SUSP: 2.0000 | Freq: Every day | NASAL | 0 refills | Status: AC

## 2023-10-18 MED ORDER — AMOXICILLIN-POT CLAVULANATE 875-125 MG PO TABS: 1.0000 | ORAL_TABLET | Freq: Two times a day (BID) | ORAL | 0 refills | Status: DC

## 2023-10-18 NOTE — Progress Notes (Signed)
 Recommended to schedule video visit  Roney Jaffe, PA-C Physician Assistant

## 2023-10-18 NOTE — Patient Instructions (Addendum)
 Catherine [name], thank you for joining [name], PA-C for today's virtual visit.  While this provider is not your primary care provider (PCP), if your PCP is located in our provider database this encounter information will be shared with them immediately following your visit.   A [facility] MyChart account gives you access to today's visit and all your visits, tests, and labs performed at [facility]  click here if you don't have a [facility] MyChart account or go to [email or web address]  Consent: (Patient) Catherine [name] provided verbal consent for this virtual visit at the beginning of the encounter.  Current Medications:  Current Outpatient Medications:    amoxicillin -clavulanate (AUGMENTIN ) 875-125 MG tablet, Take 1 tablet by mouth 2 (two) times daily., Disp: 20 tablet, Rfl: 0   fluticasone  (FLONASE ) 50 MCG/ACT nasal spray, Place 2 sprays into both nostrils daily., Disp: 16 g, Rfl: 0   albuterol  (VENTOLIN  HFA) 108 (90 Base) MCG/ACT inhaler, Inhale 2 puffs into the lungs every 6 (six) hours as needed for wheezing or shortness of breath., Disp: 8 g, Rfl: 0   aspirin-acetaminophen -caffeine (EXCEDRIN MIGRAINE) 250-250-65 MG per tablet, Take 2 tablets by mouth 2 (two) times daily as needed for migraine. For headache, Disp: , Rfl:    celecoxib  (CELEBREX ) 200 MG capsule, TAKE 1 CAPSULE (200 MG TOTAL) BY MOUTH DAILY. SCHEDULE VISIT BEFORE NEXT FILL, Disp: 30 capsule, Rfl: 0   cetirizine  (ZYRTEC ) 10 MG tablet, Take 10 mg by mouth daily., Disp: , Rfl:    famotidine  (PEPCID ) 20 MG tablet, TAKE 1 TABLET BY MOUTH EVERYDAY AT BEDTIME, Disp: 90 tablet, Rfl: 1   furosemide  (LASIX ) 20 MG tablet, TAKE 1 TABLET BY MOUTH TWICE A DAY, Disp: 180 tablet, Rfl: 2   gabapentin  (NEURONTIN ) 300 MG capsule, TAKE 1 CAPSULE BY MOUTH EVERY DAY, Disp: 90 capsule, Rfl: 1   metFORMIN  (GLUCOPHAGE -XR) 500 MG 24 hr tablet, TAKE 1 TABLET BY MOUTH EVERY DAY WITH BREAKFAST, Disp: 90 tablet, Rfl: 1    neomycin-polymyxin-pramoxine (NEOSPORIN  PLUS) 1 % cream, Apply topically 2 (two) times daily., Disp: 14.2 g, Rfl: 0   pantoprazole  (PROTONIX ) 40 MG tablet, TAKE 1 TABLET (40 MG TOTAL) BY MOUTH DAILY. TAKE 30 MINUTES PRIOR TO BREAKFAST, Disp: 90 tablet, Rfl: 1   tiZANidine  (ZANAFLEX ) 4 MG tablet, TAKE 1 TABLET (4 MG TOTAL) BY MOUTH EVERY 8 (EIGHT) HOURS AS NEEDED FOR MUSCLE SPASMS, Disp: 30 tablet, Rfl: 0   Vitamin D , Ergocalciferol , (DRISDOL ) 1.25 MG (50000 UNIT) CAPS capsule, Take 1 capsule (50,000 Units total) by mouth every 7 (seven) days., Disp: 4 capsule, Rfl: 0   Medications ordered in this encounter:  Meds ordered this encounter  Medications   amoxicillin -clavulanate (AUGMENTIN ) 875-125 MG tablet    Sig: Take 1 tablet by mouth 2 (two) times daily.    Dispense:  20 tablet    Refill:  0    Supervising Provider:   LAMPTEY, PHILIP O [id number]   fluticasone  (FLONASE ) 50 MCG/ACT nasal spray    Sig: Place 2 sprays into both nostrils daily.    Dispense:  16 g    Refill:  0    Supervising Provider:   BLAISE ALEENE KIDD [[id number]]     *If you need refills on other medications prior to your next appointment, please contact your pharmacy*  Follow-Up: Call back or seek an in-person evaluation if the symptoms worsen or if the condition fails to improve as anticipated.  [facility] [phone]  Other Instructions Sinus  Infection, Adult A sinus infection, also called sinusitis, is inflammation of your sinuses. Sinuses are hollow spaces in the bones around your face. Your sinuses are located: Around your eyes. In the middle of your forehead. Behind your nose. In your cheekbones. Mucus normally drains out of your sinuses. When your nasal tissues become inflamed or swollen, mucus can become trapped or blocked. This allows bacteria, viruses, and fungi to grow, which leads to infection. Most infections of the sinuses are caused by a virus. A sinus infection can develop quickly. It  can last for up to 4 weeks (acute) or for more than 12 weeks (chronic). A sinus infection often develops after a cold. What are the causes? This condition is caused by anything that creates swelling in the sinuses or stops mucus from draining. This includes: Allergies. Asthma. Infection from bacteria or viruses. Deformities or blockages in your nose or sinuses. Abnormal growths in the nose (nasal polyps). Pollutants, such as chemicals or irritants in the air. Infection from fungi. This is rare. What increases the risk? You are more likely to develop this condition if you: Have a weak body defense system (immune system). Do a lot of swimming or diving. Overuse nasal sprays. Smoke. What are the signs or symptoms? The main symptoms of this condition are pain and a feeling of pressure around the affected sinuses. Other symptoms include: Stuffy nose or congestion that makes it difficult to breathe through your nose. Thick yellow or greenish drainage from your nose. Tenderness, swelling, and warmth over the affected sinuses. A cough that may get worse at night. Decreased sense of smell and taste. Extra mucus that collects in the throat or the back of the nose (postnasal drip) causing a sore throat or bad breath. Tiredness (fatigue). Fever. How is this diagnosed? This condition is diagnosed based on: Your symptoms. Your medical history. A physical exam. Tests to find out if your condition is acute or chronic. This may include: Checking your nose for nasal polyps. Viewing your sinuses using a device that has a light (endoscope). Testing for allergies or bacteria. Imaging tests, such as an MRI or CT scan. In rare cases, a bone biopsy may be done to rule out more serious types of fungal sinus disease. How is this treated? Treatment for a sinus infection depends on the cause and whether your condition is chronic or acute. If caused by a virus, your symptoms should go away on their own  within 10 days. You may be given medicines to relieve symptoms. They include: Medicines that shrink swollen nasal passages (decongestants). A spray that eases inflammation of the nostrils (topical intranasal corticosteroids). Rinses that help get rid of thick mucus in your nose (nasal saline washes). Medicines that treat allergies (antihistamines). Over-the-counter pain relievers. If caused by bacteria, your health care provider may recommend waiting to see if your symptoms improve. Most bacterial infections will get better without antibiotic medicine. You may be given antibiotics if you have: A severe infection. A weak immune system. If caused by narrow nasal passages or nasal polyps, surgery may be needed. Follow these instructions at home: Medicines Take, use, or apply over-the-counter and prescription medicines only as told by your health care provider. These may include nasal sprays. If you were prescribed an antibiotic medicine, take it as told by your health care provider. Do not stop taking the antibiotic even if you start to feel better. Hydrate and humidify  Drink enough fluid to keep your urine pale yellow. Staying hydrated will help  to thin your mucus. Use a cool mist humidifier to keep the humidity level in your home above 50%. Inhale steam for 10-15 minutes, 3-4 times a day, or as told by your health care provider. You can do this in the bathroom while a hot shower is running. Limit your exposure to cool or dry air. Rest Rest as much as possible. Sleep with your head raised (elevated). Make sure you get enough sleep each night. General instructions  Apply a warm, moist washcloth to your face 3-4 times a day or as told by your health care provider. This will help with discomfort. Use nasal saline washes as often as told by your health care provider. Wash your hands often with soap and water to reduce your exposure to germs. If soap and water are not available, use hand  sanitizer. Do not smoke. Avoid being around people who are smoking (secondhand smoke). Keep all follow-up visits. This is important. Contact a health care provider if: You have a fever. Your symptoms get worse. Your symptoms do not improve within 10 days. Get help right away if: You have a severe headache. You have persistent vomiting. You have severe pain or swelling around your face or eyes. You have vision problems. You develop confusion. Your neck is stiff. You have trouble breathing. These symptoms may be an emergency. Get help right away. Call 911. Do not wait to see if the symptoms will go away. Do not drive yourself to the hospital. Summary A sinus infection is soreness and inflammation of your sinuses. Sinuses are hollow spaces in the bones around your face. This condition is caused by nasal tissues that become inflamed or swollen. The swelling traps or blocks the flow of mucus. This allows bacteria, viruses, and fungi to grow, which leads to infection. If you were prescribed an antibiotic medicine, take it as told by your health care provider. Do not stop taking the antibiotic even if you start to feel better. Keep all follow-up visits. This is important. This information is not intended to replace advice given to you by your health care provider. Make sure you discuss any questions you have with your health care provider. Document Revised: 09/03/2021 Document Reviewed: 09/03/2021 Elsevier Patient Education  2024 Elsevier Inc.    If you have been instructed to have an in-person evaluation today at a local Urgent Care facility, please use the link below. It will take you to a list of all of our available [facility], including address, phone number and hours of operation. Please do not delay care.  [facility]  If you or a family member do not have a primary care provider, use the link below to schedule a visit and establish care. When you choose a  [facility] primary care physician or advanced practice provider, you gain a long-term partner in health. Find a Primary Care Provider  Learn more about [facility]'s in-office and virtual care options: [facility] - Get Care Now

## 2023-10-18 NOTE — Progress Notes (Signed)
 Virtual Visit Consent   Catherine [name], you are scheduled for a virtual visit with a [facility] provider today. Just as with appointments in the office, your consent must be obtained to participate. Your consent will be active for this visit and any virtual visit you may have with one of our providers in the next 365 days. If you have a MyChart account, a copy of this consent can be sent to you electronically.  As this is a virtual visit, video technology does not allow for your provider to perform a traditional examination. This may limit your provider's ability to fully assess your condition. If your provider identifies any concerns that need to be evaluated in person or the need to arrange testing (such as labs, EKG, etc.), we will make arrangements to do so. Although advances in technology are sophisticated, we cannot ensure that it will always work on either your end or our end. If the connection with a video visit is poor, the visit may have to be switched to a telephone visit. With either a video or telephone visit, we are not always able to ensure that we have a secure connection.  By engaging in this virtual visit, you consent to the provision of healthcare and authorize for your insurance to be billed (if applicable) for the services provided during this visit. Depending on your insurance coverage, you may receive a charge related to this service.  I need to obtain your verbal consent now. Are you willing to proceed with your visit today? Catherine [name] has provided verbal consent on 10/18/2023 for a virtual visit (video or telephone). Catherine GORMAN Sage, PA-C  Date: 10/18/2023 10:42 AM  Virtual Visit via Video Note   I, Catherine [name], connected with  Catherine [name]  ([id number], 29-Dec-1980) on 10/18/23 at 10:30 AM EST by a video-enabled telemedicine application and verified that I am speaking with the correct person using two identifiers.  Location: Patient: Virtual Visit Location Patient:  Home Provider: Virtual Visit Location Provider: Home Office   I discussed the limitations of evaluation and management by telemedicine and the availability of in person appointments. The patient expressed understanding and agreed to proceed.    History of Present Illness: COLLEENE SWARTHOUT is a 43 y.o. who identifies as a female who was assigned female at birth, and  reports experiencing significant congestion, primarily in the nasal and ear regions, pain in her sinuses since shortly after New Year's. She describes the discomfort as being particularly pronounced in the left ear, accompanied by a sore throat that worsens when coughing. The patient has also been experiencing intermittent fevers, with the highest recorded temperature being 99.30F.  The patient has been able to eat and drink, albeit with some difficulty due to the congestion. She reports producing light yellow nasal discharge and sputum  In an attempt to alleviate symptoms, the patient has been taking over-the-counter cold medication (Tylenol  severe cold) every four hours, alternating between liquid and pill forms, and supplementing with ibuprofen . However, these measures have not provided significant relief. The patient has also been taking Zyrtec  daily, but reports no noticeable improvement in symptoms. She has not needed to use her inhaler.  She has not taken a home COVID test since the onset of the current illness. The patient's grandchild was recently ill, but the symptoms were not as severe as the patient's.   HPI: HPI  Problems:  Patient Active Problem List   Diagnosis Date Noted   Left foot  pain 08/22/2023   Intertrigo 05/07/2023   Carpal tunnel syndrome of right wrist 09/15/2022   Knee pain, right 07/25/2022   Thoracic back pain 04/08/2022   SOBOE (shortness of breath on exertion) 03/10/2022   At risk for activity intolerance 03/10/2022   Skin lesion 02/28/2022   Genetic testing 02/26/2022   At high risk for breast  cancer 02/26/2022   PCOS (polycystic ovarian syndrome) 02/07/2022   Abnormal mammogram 02/07/2022   Family history of breast cancer 02/07/2022   Family history of pancreatic cancer 02/07/2022   Family history of prostate cancer 02/07/2022   Chronic pain syndrome 07/26/2021   Polyphagia 01/23/2021   Bursitis 01/01/2021   Pain in both lower extremities 11/16/2020   Peroneal neuropathy 11/16/2020   Class 3 severe obesity with serious comorbidity and body mass index (BMI) greater than or equal to 70 in adult [facility]) 06/10/2017   Fluid retention 06/10/2017   Menorrhagia 05/18/2017   Sacral radiculopathy 02/04/2017   Insulin  resistance 02/02/2017   At risk for violence to self related to depression 02/02/2017   Vitamin D  deficiency 11/12/2016   Asthma 07/25/2016   Gout 11/21/2015   BIPOLAR DISORDER UNSPECIFIED 11/22/2008   Depression 12/10/2006   Migraine headache 12/10/2006   GASTROESOPHAGEAL REFLUX, NO ESOPHAGITIS 12/10/2006   OSA (obstructive sleep apnea) 12/10/2006    Allergies:  Allergies  Allergen Reactions   Citrus Swelling   Medications:  Current Outpatient Medications:    amoxicillin -clavulanate (AUGMENTIN ) 875-125 MG tablet, Take 1 tablet by mouth 2 (two) times daily., Disp: 20 tablet, Rfl: 0   fluticasone  (FLONASE ) 50 MCG/ACT nasal spray, Place 2 sprays into both nostrils daily., Disp: 16 g, Rfl: 0   albuterol  (VENTOLIN  HFA) 108 (90 Base) MCG/ACT inhaler, Inhale 2 puffs into the lungs every 6 (six) hours as needed for wheezing or shortness of breath., Disp: 8 g, Rfl: 0   aspirin-acetaminophen -caffeine (EXCEDRIN MIGRAINE) 250-250-65 MG per tablet, Take 2 tablets by mouth 2 (two) times daily as needed for migraine. For headache, Disp: , Rfl:    celecoxib  (CELEBREX ) 200 MG capsule, TAKE 1 CAPSULE (200 MG TOTAL) BY MOUTH DAILY. SCHEDULE VISIT BEFORE NEXT FILL, Disp: 30 capsule, Rfl: 0   cetirizine  (ZYRTEC ) 10 MG tablet, Take 10 mg by mouth daily., Disp: , Rfl:    famotidine   (PEPCID ) 20 MG tablet, TAKE 1 TABLET BY MOUTH EVERYDAY AT BEDTIME, Disp: 90 tablet, Rfl: 1   furosemide  (LASIX ) 20 MG tablet, TAKE 1 TABLET BY MOUTH TWICE A DAY, Disp: 180 tablet, Rfl: 2   gabapentin  (NEURONTIN ) 300 MG capsule, TAKE 1 CAPSULE BY MOUTH EVERY DAY, Disp: 90 capsule, Rfl: 1   metFORMIN  (GLUCOPHAGE -XR) 500 MG 24 hr tablet, TAKE 1 TABLET BY MOUTH EVERY DAY WITH BREAKFAST, Disp: 90 tablet, Rfl: 1   neomycin-polymyxin-pramoxine (NEOSPORIN  PLUS) 1 % cream, Apply topically 2 (two) times daily., Disp: 14.2 g, Rfl: 0   pantoprazole  (PROTONIX ) 40 MG tablet, TAKE 1 TABLET (40 MG TOTAL) BY MOUTH DAILY. TAKE 30 MINUTES PRIOR TO BREAKFAST, Disp: 90 tablet, Rfl: 1   tiZANidine  (ZANAFLEX ) 4 MG tablet, TAKE 1 TABLET (4 MG TOTAL) BY MOUTH EVERY 8 (EIGHT) HOURS AS NEEDED FOR MUSCLE SPASMS, Disp: 30 tablet, Rfl: 0   Vitamin D , Ergocalciferol , (DRISDOL ) 1.25 MG (50000 UNIT) CAPS capsule, Take 1 capsule (50,000 Units total) by mouth every 7 (seven) days., Disp: 4 capsule, Rfl: 0  Observations/Objective: Patient is well-developed, well-nourished in no acute distress.  Resting comfortably  at home.  Head is normocephalic, atraumatic.  No labored breathing.  Speech is clear and coherent with logical content.  Patient is alert and oriented at baseline.    Assessment and Plan: 1. Acute non-recurrent frontal sinusitis (Primary) - amoxicillin -clavulanate (AUGMENTIN ) 875-125 MG tablet; Take 1 tablet by mouth 2 (two) times daily.  Dispense: 20 tablet; Refill: 0 - fluticasone  (FLONASE ) 50 MCG/ACT nasal spray; Place 2 sprays into both nostrils daily.  Dispense: 16 g; Refill: 0   Severe congestion, ear pain, and sore throat since New Year's. Fever up to 99.8. Yellow nasal discharge and productive cough. No improvement with over-the-counter cold medications and Zyrtec . -Prescribe Amoxicillin  500mg  BID for 7 days. -Prescribe Fluticasone  nasal spray, 2 sprays in each nostril daily. -Continue Zyrtec   daily. -Continue Tylenol  or ibuprofen  as needed for pain. -Encourage rest and hydration.   Follow Up Instructions: I discussed the assessment and treatment plan with the patient. The patient was provided an opportunity to ask questions and all were answered. The patient agreed with the plan and demonstrated an understanding of the instructions.  A copy of instructions were sent to the patient via MyChart unless otherwise noted below.   Patient has requested to receive PHI (AVS, Work Notes, etc) pertaining to this video visit through e-mail as they are currently without active MyChart. They have voiced understand that email is not considered secure and their health information could be viewed by someone other than the patient.   The patient was advised to call back or seek an in-person evaluation if the symptoms worsen or if the condition fails to improve as anticipated.    Catherine RAMAN Mayers, PA-C

## 2023-10-21 ENCOUNTER — Ambulatory Visit: Payer: No Typology Code available for payment source | Admitting: Sports Medicine

## 2023-10-23 ENCOUNTER — Other Ambulatory Visit: Payer: Self-pay | Admitting: Student

## 2023-10-23 DIAGNOSIS — M79605 Pain in left leg: Secondary | ICD-10-CM

## 2023-10-27 ENCOUNTER — Other Ambulatory Visit: Payer: Self-pay | Admitting: Primary Care

## 2023-10-27 NOTE — Telephone Encounter (Signed)
Pt needs an appointment for further refills  

## 2023-10-28 ENCOUNTER — Other Ambulatory Visit: Payer: Self-pay

## 2023-10-28 ENCOUNTER — Ambulatory Visit (INDEPENDENT_AMBULATORY_CARE_PROVIDER_SITE_OTHER): Payer: No Typology Code available for payment source | Admitting: Sports Medicine

## 2023-10-28 VITALS — BP 139/88 | Ht 67.0 in | Wt >= 6400 oz

## 2023-10-28 DIAGNOSIS — M25551 Pain in right hip: Secondary | ICD-10-CM

## 2023-10-28 DIAGNOSIS — M722 Plantar fascial fibromatosis: Secondary | ICD-10-CM | POA: Diagnosis not present

## 2023-10-28 MED ORDER — AMBULATORY NON FORMULARY MEDICATION: 0 refills | Status: AC

## 2023-10-28 MED ORDER — METHYLPREDNISOLONE ACETATE 40 MG/ML IJ SUSP
40.0000 mg | Freq: Once | INTRAMUSCULAR | Status: AC
  Administered 2023-10-28: 40 mg via INTRA_ARTICULAR

## 2023-10-28 NOTE — Progress Notes (Signed)
PCP: Bess Kinds, MD  SUBJECTIVE:   HPI:  Patient is a 43 y.o. female here with chief complaint of right lateral hip pain. She reports this has been an on and off issue for her dating back to 2022. She was found to have trochanteric bursitis and given an injection which helped her greatly. She reports that she rehabbed it but the pain is back and is severe. It is limiting her ability to ambulate. She locates it to the posterolateral aspect of the hip and also radiates along the lateral aspect of the leg down to the knee. She has been taking tylenol, celebrex and zanaflex with little benefit. She is hoping to get an injection today and asks for a prescription for compression shorts locally.  She reports since last visit in 08/2023 her knees are doing well after her cortisone injections. She does still have some heel pain though is doing ok with the stretches and heel pads for her plantar fasciitis. She is interested in getting green inserts today.   Pertinent ROS were reviewed with the patient and found to be negative unless otherwise specified above in HPI.   PERTINENT  PMH / PSH / FH / SH:  Past Medical, Surgical, Social, and Family History Reviewed & Updated in the EMR.  Pertinent findings include:  Morbid Obesity Bilateral Knee OA Left Partial ACL Tear in 2010 managed non-operatively   Allergies  Allergen Reactions   Citrus Swelling    OBJECTIVE:  BP 139/88   Ht 5\' 7"  (1.702 m)   Wt (!) 465 lb (210.9 kg)   BMI 72.83 kg/m   PHYSICAL EXAM:  GEN: Alert and Oriented, NAD, comfortable in exam room RESP: Unlabored respirations, symmetric chest rise PSY: normal mood, congruent affect   Right Hip MSK EXAM: Exam is limited by body habitus, though no appreciable swelling, deformity, or bruising. Right Hip ROM is full TTP over greater trochanter, entirety of glute med, mildly tender at SI joint, moderately TTP over TFL and very tender down her IT band. Has weakness with hip  abduction, normal strength with hip flexion. + stinchfield + FABER, - FADIR, Unable to perform piriformis stretch - Straight leg raise.  Review of right hip x-rays from 12/2020 were personally reviewed and do have limited detail because of underpenetration , however does have some mild degenerative changes at the inferior portion of the glenohumeral joint though fairly preserved space superiorly without appreciable degenerative changes. Assessment & Plan Right hip pain  Greater trochanteric pain syndrome of right lower extremity Presentation most consistent with GTPS of the right hip and IT band syndrome. We reviewed mainstay of therapy is going to be working on hip abductor strengthening. She is exquisitely tender over the trochanter so I did offer her a cortisone injection which was performed as below. Formal PT referral placed as well. Rx script provided for compression shorts. Advised f/u in 6 weeks.  Plantar fasciitis of left foot Bilateral pes planus and mild plantar fasciitis on the left. She was interested in green inserts (men's size 9-9.5) with scaphoid pads which were provided her today. Encouraged she continue her foot and calf stretches in addition to this.    Procedure: U/S Guided Right Greater Trochanteric Injection After discussion of the risks, benefits, and alternatives to injection the patient provided verbal and written consent to proceed.  A preprocedural timeout was conducted to verify correct patient, procedure, and site.  Prior to the procedure the posterolateral right hip was examined with a curvilinear  probe with visualization of the greater trochanter and distal gluteus medius tendon.  Color Doppler flow utilized to visualize vasculature and optimal needle track.  Following this the skin was marked and prepped with alcohol swabs.  The area was again reexamined using the same transducer and sterile gel in the "no touch" technique.  Local anesthesia was obtained using a 1.5  inch 27 gauge needle with 3 mL 1% lidocaine under ultrasound guidance.  Then under ultrasound guidance a 5 inch 22-gauge needle was advanced down to the greater trochanter using in-plane approach.  A mixture of 3 mL 1% lidocaine and 1 mL of Depo-Medrol 40 mg/mL was injected. Patient tolerated procedure well without immediate complications. The patient was counseled as to the expected post-injection course, including the possibility of worsening of pain with steroid flare. Instructed as to concerning symptoms and advised to contact the office if these should arise.    Glean Salen, MD PGY-4, Sports Medicine Fellow [facility]  Addendum:  I was the preceptor for this visit and available for immediate consultation.  Norton Blizzard MD Marrianne Mood

## 2023-11-17 ENCOUNTER — Other Ambulatory Visit: Payer: Self-pay

## 2023-11-17 ENCOUNTER — Ambulatory Visit: Payer: PRIVATE HEALTH INSURANCE | Attending: Sports Medicine | Admitting: Physical Therapy

## 2023-11-17 ENCOUNTER — Encounter: Payer: Self-pay | Admitting: Physical Therapy

## 2023-11-17 DIAGNOSIS — M5459 Other low back pain: Secondary | ICD-10-CM | POA: Diagnosis present

## 2023-11-17 DIAGNOSIS — M25551 Pain in right hip: Secondary | ICD-10-CM | POA: Diagnosis present

## 2023-11-17 DIAGNOSIS — M6281 Muscle weakness (generalized): Secondary | ICD-10-CM | POA: Diagnosis present

## 2023-11-17 DIAGNOSIS — R2689 Other abnormalities of gait and mobility: Secondary | ICD-10-CM | POA: Insufficient documentation

## 2023-11-17 NOTE — Therapy (Signed)
 OUTPATIENT PHYSICAL THERAPY LOWER EXTREMITY EVALUATION  Patient Name: Catherine [name] MRN: [id number] DOB:[birth year], 43 y.o., female Today's Date: 11/17/2023   PT End of Session - 11/17/23 1728     Visit Number 1    Number of Visits --   1-2x/week   Date for PT Re-Evaluation 01/12/24    Authorization Type [employer]    Authorization - Number of Visits 30    PT Start Time 1650    PT Stop Time 1723    PT Time Calculation (min) 33 min             Past Medical History:  Diagnosis Date   Allergy    Anxiety    Arthritis    Asthma    since baby, seasonal   ASTHMA, UNSPECIFIED 12/10/2006   Qualifier: Diagnosis of  By: Sharron Railing     CELLULITIS AND ABSCESS OF LEG EXCEPT FOOT 12/30/2007   Qualifier: Diagnosis of  By: Gladis FNP, Nykedtra     Cyst of bone    right side of spine   DEPENDENT EDEMA, LEGS, BILATERAL 11/22/2008   Qualifier: Diagnosis of  By: Loretha MD, Victoria     Depression 2003Dx   Edema    Food allergy    citris   FOOT PAIN, RIGHT 11/22/2008   Qualifier: Diagnosis of  By: Loretha MD, Victoria     Gastric ulcer    GERD (gastroesophageal reflux disease)    Gout    Hypertension    took Norvasc  for 3 months, doctor discontinued no longer on BP medications   Joint pain    KNEE INJURY, LEFT 01/01/2009   Qualifier: Diagnosis of  By: Adella MD, Elizabeth     Migraine    ONYCHOMYCOSIS, TOENAILS 11/22/2008   Qualifier: Diagnosis of  By: Loretha MD, Victoria     Shortness of breath    Sleep apnea    TINEA PEDIS 11/22/2008   Qualifier: Diagnosis of  By: Loretha MD, Richerd     Past Surgical History:  Procedure Laterality Date   DILITATION & CURRETTAGE/HYSTROSCOPY WITH NOVASURE ABLATION N/A 07/03/2017   Procedure: DILATATION & CURETTAGE/HYSTEROSCOPY WITH NOVASURE ABLATION;  Surgeon: Lavoie, Marie-Lyne, MD;  Location: [facility];  Service: Gynecology;  Laterality: N/A;   UPPER GI ENDOSCOPY     WISDOM TOOTH EXTRACTION  2008   x4   Patient Active Problem  List   Diagnosis Date Noted   Left foot pain 08/22/2023   Intertrigo 05/07/2023   Carpal tunnel syndrome of right wrist 09/15/2022   Knee pain, right 07/25/2022   Thoracic back pain 04/08/2022   SOBOE (shortness of breath on exertion) 03/10/2022   At risk for activity intolerance 03/10/2022   Skin lesion 02/28/2022   Genetic testing 02/26/2022   At high risk for breast cancer 02/26/2022   PCOS (polycystic ovarian syndrome) 02/07/2022   Abnormal mammogram 02/07/2022   Family history of breast cancer 02/07/2022   Family history of pancreatic cancer 02/07/2022   Family history of prostate cancer 02/07/2022   Chronic pain syndrome 07/26/2021   Polyphagia 01/23/2021   Bursitis 01/01/2021   Pain in both lower extremities 11/16/2020   Peroneal neuropathy 11/16/2020   Class 3 severe obesity with serious comorbidity and body mass index (BMI) greater than or equal to 70 in adult [facility]) 06/10/2017   Fluid retention 06/10/2017   Menorrhagia 05/18/2017   Sacral radiculopathy 02/04/2017   Insulin  resistance 02/02/2017   At risk for violence to self related to depression 02/02/2017  Vitamin D  deficiency 11/12/2016   Asthma 07/25/2016   Gout 11/21/2015   BIPOLAR DISORDER UNSPECIFIED 11/22/2008   Depression 12/10/2006   Migraine headache 12/10/2006   GASTROESOPHAGEAL REFLUX, NO ESOPHAGITIS 12/10/2006   OSA (obstructive sleep apnea) 12/10/2006    PCP: Jennelle Riis, MD  REFERRING PROVIDER: Levorn Prentice BIRCH, MD  THERAPY DIAG:  Right hip pain - Plan: PT plan of care cert/re-cert  Muscle weakness - Plan: PT plan of care cert/re-cert  Other abnormalities of gait and mobility - Plan: PT plan of care cert/re-cert  REFERRING DIAG: Right hip pain [M25.551], Greater trochanteric pain syndrome of right lower extremity [M25.551]   Rationale for Evaluation and Treatment:  Rehabilitation  SUBJECTIVE:  PERTINENT PAST HISTORY:  Bipolar, depression, gout, radiculopathy, obesity, chronic pain  syndrome, SOBOE        PRECAUTIONS: None  WEIGHT BEARING RESTRICTIONS No  FALLS:  Has patient fallen in last 6 months? No, Number of falls: 0  MOI/History of condition:  Onset date: 2022  SUBJECTIVE STATEMENT  Catherine [name] is a 43 y.o. female who presents to clinic with chief complaint of R hip pain.  This is a chronic issue ongoing since 2022.  She was diagnosed with bursitis.  She notes that it is worse with weather changes.  She received an injection in the R hip about 3 weeks ago which was somewhat helpful.    Red flags:  denies   Pain:  Are you having pain? Yes Pain location: R lateral hip to mid lateral thigh  NPRS scale:  1/10 to 10/10 Aggravating factors: standing, steps, inclines, walking Relieving factors: rest Pain description: aching Stage: Chronic 24 hour pattern: NA   Occupation: process medical records, sitting for long periods  Assistive Device: NA  Hand Dominance: NA  Patient Goals/Specific Activities: reduce pain, stand without pain   OBJECTIVE:   DIAGNOSTIC FINDINGS:  None recent  GENERAL OBSERVATION/GAIT: Slow antalgic gait, reduced time in stance on R, WBOS  SENSATION: Light touch: appears intact  PALPATION: Significant TTP R hip   LE MMT:  MMT Right (Eval) Left (Eval)  Hip flexion (L2, L3) 4 4  Knee extension (L3) 4+ 3+*  Knee flexion 4 4  Hip abduction 4 NT  Hip extension    Hip external rotation    Hip internal rotation    Hip adduction    Ankle dorsiflexion (L4)    Ankle plantarflexion (S1)    Ankle inversion    Ankle eversion    Great Toe ext (L5)    Grossly     (Blank rows = not tested, score listed is out of 5 possible points.  N = WNL, D = diminished, C = clear for gross weakness with myotome testing, * = concordant pain with testing)  LE ROM:  ROM Right (Eval) Left (Eval)  Hip flexion    Hip extension    Hip abduction    Hip adduction    Hip internal rotation    Hip external rotation    Knee  extension    Knee flexion    Ankle dorsiflexion    Ankle plantarflexion    Ankle inversion    Ankle eversion     (Blank rows = not tested, N = WNL, * = concordant pain with testing)  Functional Tests  Eval  PATIENT SURVEYS:  LEFS: 24/80   TODAY'S TREATMENT: [place]/HM:  Below pt education and discussion regarding sleep positioning to reduce load on R hip   PATIENT EDUCATION:  POC, diagnosis, prognosis,, and outcome measures.  Pt educated via explanation, demonstration.  Pt confirms understanding verbally.   HOME EXERCISE PROGRAM: None provided today  Treatment priorities   Eval                                                  ASSESSMENT:  CLINICAL IMPRESSION: Trinisha is a 43 y.o. female who presents to clinic with signs and sxs consistent with R hip pain.  Consistent with GTPS with some element of ITBS.  She is exquisitely tender over GT as well as glute med and TFL.  Given pts high pain and BMI I recommend a round of aquatic therapy to allow more comfortable exercise in a gravity reduced environment.  Pt will benefit from skilled PT to address relevant deficits and improve comfort with daily tasks such as step navigation into her apartment and other functional mobility.      OBJECTIVE IMPAIRMENTS: Pain, hip and LE strength, gait  ACTIVITY LIMITATIONS: standing, walking, steps, squatting, sleeping  PERSONAL FACTORS: See medical history and pertinent history   REHAB POTENTIAL: Good  CLINICAL DECISION MAKING: Evolving/moderate complexity  EVALUATION COMPLEXITY: Moderate   GOALS:   SHORT TERM GOALS: Target date: 12/15/2023   Samiya will be >75% HEP compliant to improve carryover between sessions and facilitate independent management of condition  Evaluation: ongoing Goal status: INITIAL   LONG TERM GOALS: Target date: 01/12/2024   Siyona will self report >/= 50% decrease in pain  from evaluation to improve function in daily tasks  Evaluation/Baseline: 10/10 max pain Goal status: INITIAL   2.  Myriah will be able to navigate 10 x3 steps, not limited by pain, to improve ability to navigate steps going into her apartment  Evaluation/Baseline: unable Goal status: INITIAL   3.  Ramesha will show a >/= 18 pt improvement in LEFS score (MCID is ~11% or 9 pts) as a proxy for functional improvement   Evaluation/Baseline: 24/80 pts Goal status: INITIAL   4.  Trenika will report confidence in self management of condition at time of discharge with advanced HEP  Evaluation/Baseline: unable to self manage Goal status: INITIAL   PLAN: PT FREQUENCY: 1-2x/week  PT DURATION: 8 weeks  PLANNED INTERVENTIONS:  97164- PT Re-evaluation, 97110-Therapeutic exercises, 97530- Therapeutic activity, 97112- Neuromuscular re-education, 97535- Self Care, 02859- Manual therapy, [id number]- Gait training, [id number]- Aquatic Therapy, [id number]- Electrical stimulation (manual), [id number]- Vasopneumatic device, [id number]- Traction (mechanical), [id number]- Ionotophoresis 4mg /ml Dexamethasone , Taping, Dry Needling, Joint manipulation, and Spinal manipulation.   Allexus Ovens PT, DPT 11/17/2023, 5:47 PM

## 2023-11-19 ENCOUNTER — Encounter: Payer: Self-pay | Admitting: Student

## 2023-12-03 ENCOUNTER — Ambulatory Visit: Payer: PRIVATE HEALTH INSURANCE | Admitting: Physical Therapy

## 2023-12-04 ENCOUNTER — Ambulatory Visit: Payer: No Typology Code available for payment source | Admitting: Student

## 2023-12-04 NOTE — Patient Instructions (Incomplete)
It was great to see you! Thank you for allowing me to participate in your care!  I recommend that you always bring your medications to each appointment as this makes it easy to ensure we are on the correct medications and helps [name] not miss when refills are needed.  Our plans for today:  - Anxiety  Take Prozac 10 mg daily   Follow up with a therapist (see list below)  We are checking some labs today, I will call you if they are abnormal will send you a MyChart message or a letter if they are normal.  If you do not hear about your labs in the next 2 weeks please let us know.***  Take care and seek immediate care sooner if you develop any concerns.   Dr. Bess Kinds, MD [facility]    Therapy and Counseling Resources Most providers on this list will take Medicaid. Patients with commercial insurance or Medicare should contact their insurance company to get a list of in network providers.  [facility] (takes children) Location 1: [address] [place], [place] [address] Location 2: [address] [place], [place] [address] [phone]   [facility] (spanish speaking therapist available)(habla espanol)(take medicare and medicaid)  [address], [place], [place] [address], [email or web address] [email or web address] [phone]  [facility]iatry and Counseling [address] [place], [place] [address] [phone]  [facility]   [address] Suite [place], [place] [address]      [phone]  [facility] (spanish available) [address]  [place], [place] [address] [phone]  [facility] (take [facility] and medicare) [address]  [place], [place] [address]       [phone]     [facility] (virtual only) [phone]  [facility]ovita [facility]ussma[facility] [address] [place], [place] [phone]  [facility]:  [address] [place]  [phone]  [facility]:  [address] [place] [phone]  [facility] (under & uninsured) [address]   [place] [phone]    [email or web address]    [facility] [address]  [place]    [phone]  [facility] -[address]     Phone:  [phone]     [facility]-  [address]  [phone]   [facility] [address]      [place], [place] [phone]  [facility]: [address] [place], [place], [place]  [phone]   [facility] (Spanish therapist) [email or web address]  [address] [place]  [address]   [place] [address]    [phone]    [facility]   [address],  [place], [place]  [phone]   [facility]  [address] [place]  [phone]  [facility]  [address] [place], [place]        [phone]  Open Access/Walk In Clinic under & uninsured  [facility]  [address] [place], [place] [phone] Crisis [phone]  [facility] [address],  (Spanish)   [address], [place]: [phone]) 8:30 - 12; 1 - 2:30  [facility] [employer],  [address] [place], [place]    [phone]):8:30 - 12; 2 - 3PM  [facility] [place],  [place]  [address]  [place]; [phone]):   Mon - Fri 8 AM - 5 PM  [facility] [address] [place]  MWF 12:30 to 3:00 or call to schedule an appointment  [phone]  Specific Provider options Psychology Today  [email or web address] click on find a therapist  enter your zip code left side and select or tailor a therapist for your specific need.   [facility] Provider Directory [email or web address]  (Medicaid)   Follow all drop  down to find a provider  Social Support program [facility] [place] [phone] or [employer] [address], [place], [place] Recovery support and educational   24- Hour Availability:   [facility]  [address] [place], [place] [phone] Crisis [phone]  [facility] [phone]  [facility]  [phone]   [facility]  [phone] (after hours)  Therapeutic Alternative/Mobile Crisis   [phone]  [place] National Suicide Hotline  [phone] Len Childs)  Call 911 or go to emergency room  [facility]  [phone]);  [place] and [employer]  [phone]); [place], [place], [place], [place], [place], [place], [place]

## 2023-12-04 NOTE — Progress Notes (Deleted)
  SUBJECTIVE:   CHIEF COMPLAINT / HPI:   Anxiety and Panic Attacks  PERTINENT  PMH / PSH: ***  Past Medical History:  Diagnosis Date   Allergy    Anxiety    Arthritis    Asthma    since baby, seasonal   ASTHMA, UNSPECIFIED 12/10/2006   Qualifier: Diagnosis of  By: Abundio Miu     CELLULITIS AND ABSCESS OF LEG EXCEPT FOOT 12/30/2007   Qualifier: Diagnosis of  By: Daphine Deutscher FNP, Nykedtra     Cyst of bone    right side of spine   DEPENDENT EDEMA, LEGS, BILATERAL 11/22/2008   Qualifier: Diagnosis of  By: Barbaraann Barthel MD, [place]     Depression 2003Dx   Edema    Food allergy    citris   FOOT PAIN, RIGHT 11/22/2008   Qualifier: Diagnosis of  By: Barbaraann Barthel MD, [place]     Gastric ulcer    GERD (gastroesophageal reflux disease)    Gout    Hypertension    took Norvasc for 3 months, doctor discontinued no longer on BP medications   Joint pain    KNEE INJURY, LEFT 01/01/2009   Qualifier: Diagnosis of  By: Delrae Alfred MD, Elizabeth     Migraine    ONYCHOMYCOSIS, TOENAILS 11/22/2008   Qualifier: Diagnosis of  By: Barbaraann Barthel MD, [place]     Shortness of breath    Sleep apnea    TINEA PEDIS 11/22/2008   Qualifier: Diagnosis of  By: Barbaraann Barthel MD, [place]     OBJECTIVE:  There were no vitals taken for this visit. Physical Exam   ASSESSMENT/PLAN:   Assessment & Plan  No follow-ups on file. Bess Kinds, MD 12/04/2023, 7:39 AM PGY-***, [facility] {    This will disappear when note is signed, click to select method of visit    :1}

## 2023-12-07 ENCOUNTER — Ambulatory Visit (INDEPENDENT_AMBULATORY_CARE_PROVIDER_SITE_OTHER): Payer: No Typology Code available for payment source | Admitting: Student

## 2023-12-07 VITALS — BP 132/87 | HR 104 | Ht 67.0 in | Wt >= 6400 oz

## 2023-12-07 DIAGNOSIS — F39 Unspecified mood [affective] disorder: Secondary | ICD-10-CM

## 2023-12-07 MED ORDER — QUETIAPINE FUMARATE 50 MG PO TABS: 50.0000 mg | ORAL_TABLET | Freq: Every day | ORAL | 0 refills | Status: DC

## 2023-12-07 MED ORDER — HYDROXYZINE HCL 10 MG PO TABS: 10.0000 mg | ORAL_TABLET | Freq: Three times a day (TID) | ORAL | 0 refills | Status: DC | PRN

## 2023-12-07 NOTE — Patient Instructions (Signed)
 It was great to see you! Thank you for allowing me to participate in your care!  I recommend that you always bring your medications to each appointment as this makes it easy to ensure you are on the correct medications and helps [name] not miss when refills are needed.  Our plans for today:  - Go to [email or web address] to find a therapist - I sent a prescription for Atarax (anxiety medication) to take as needed - I referred you to psychiatry, they will call to schedule   Take care and seek immediate care sooner if you develop any concerns.   Dr. Erick Alley, DO [facility]  24- Hour Availability:   [facility]  [address] [place], [place] [phone] Crisis [phone]  [facility] [phone]  [place] [facility]  [phone]   [facility]  [phone] (after hours)  Therapeutic Alternative/Mobile Crisis   [phone]  [place] National Suicide Hotline  [phone] Len Childs)  Call 911 or go to emergency room  [facility]  [phone]);  [place] and [employer]  [phone]); [place], [place], [place], [place], [place], [place], [place]

## 2023-12-07 NOTE — Progress Notes (Unsigned)
    SUBJECTIVE:   CHIEF COMPLAINT / HPI:   Mood  She is having worsening anxiety attacks over the past month.  Has battled with anxiety for several years.  When she has attacks, she stiffens up, feels very emotional, gets "ansty and shakey" and doesn't want to be around people Deep breathing and talking with her wife helps calm her  Denies and SI or HI Enjoys playing on phone, computer games, reading (haven't been doing this much lately) Not currently in therapy but is interested in both therapy, and medication  Has bipolar disorder on problem list - used to see a psychiatrics early 2000 but didn't like going. Denies ever having a manic episode. Is willing to see psychiatrist again.    PERTINENT  PMH / PSH: Bipolar disorder  OBJECTIVE:   BP 132/87   Pulse (!) 104   Ht 5\' 7"  (1.702 m)   Wt (!) 488 lb 9.6 oz (221.6 kg)   SpO2 98%   BMI 76.53 kg/m    General: NAD, tearful Cardiac: Well-perfused Respiratory: Breathing comfortably on room air Skin: warm and dry Neuro: alert, no obvious focal deficits Psych: Depressed mood, tearful throughout exam, anxious appearing  ASSESSMENT/PLAN:   Mood disorder (HCC) Patient with anxiety and PHQ-9 7 indicating mild depression, no SI or HI.  Mild tachycardia likely related to anxiety and weight.  -d/t history of bipolar disorder, referral placed to psychiatry to help with medication management -Seroquel 50 mg nightly for now -Discussed how to use psychology today.com to find a therapist -Given mental health emergency resources -Encouraged patient to continue taking with loved ones regularly, continued doing hobbies she enjoys every day, work on deep breathing exercises, spend more time outside now that weather is getting warm -Return in 1 month for follow-up     Dr. Erick Alley, DO [facility]

## 2023-12-08 ENCOUNTER — Encounter: Payer: Self-pay | Admitting: Student

## 2023-12-08 DIAGNOSIS — F39 Unspecified mood [affective] disorder: Secondary | ICD-10-CM | POA: Insufficient documentation

## 2023-12-08 NOTE — Assessment & Plan Note (Addendum)
 Patient with anxiety and PHQ-9 7 indicating mild depression, no SI or HI.  Mild tachycardia likely related to anxiety and weight.  -d/t history of bipolar disorder, referral placed to psychiatry to help with medication management -Seroquel 50 mg nightly for now -Discussed how to use psychology today.com to find a therapist -Given mental health emergency resources -Encouraged patient to continue taking with loved ones regularly, continued doing hobbies she enjoys every day, work on deep breathing exercises, spend more time outside now that weather is getting warm -Return in 1 month for follow-up

## 2023-12-10 ENCOUNTER — Encounter: Payer: Self-pay | Admitting: Physical Therapy

## 2023-12-10 ENCOUNTER — Ambulatory Visit: Payer: PRIVATE HEALTH INSURANCE | Admitting: Physical Therapy

## 2023-12-10 DIAGNOSIS — M25551 Pain in right hip: Secondary | ICD-10-CM

## 2023-12-10 DIAGNOSIS — M5459 Other low back pain: Secondary | ICD-10-CM

## 2023-12-10 DIAGNOSIS — M6281 Muscle weakness (generalized): Secondary | ICD-10-CM

## 2023-12-10 DIAGNOSIS — R2689 Other abnormalities of gait and mobility: Secondary | ICD-10-CM

## 2023-12-10 NOTE — Therapy (Unsigned)
 OUTPATIENT PHYSICAL THERAPY DAILY NOTE  Patient Name: Catherine [name] MRN: [id number] DOB:[birth year], 43 y.o., female Today's Date: 12/11/2023   PT End of Session - 12/10/23 1550     Visit Number 2    Number of Visits --   1-2x/week   Date for PT Re-Evaluation 01/12/24    Authorization Type [employer]    Authorization - Number of Visits 30    PT Start Time 1555    PT Stop Time 1635    PT Time Calculation (min) 40 min              Past Medical History:  Diagnosis Date   Allergy    Anxiety    Arthritis    Asthma    since baby, seasonal   ASTHMA, UNSPECIFIED 12/10/2006   Qualifier: Diagnosis of  By: Abundio Miu     CELLULITIS AND ABSCESS OF LEG EXCEPT FOOT 12/30/2007   Qualifier: Diagnosis of  By: Daphine Deutscher FNP, [place]     Cyst of bone    right side of spine   DEPENDENT EDEMA, LEGS, BILATERAL 11/22/2008   Qualifier: Diagnosis of  By: Barbaraann Barthel MD, [place]     Depression 2003Dx   Edema    Food allergy    citris   FOOT PAIN, RIGHT 11/22/2008   Qualifier: Diagnosis of  By: Barbaraann Barthel MD, [place]     Gastric ulcer    GERD (gastroesophageal reflux disease)    Gout    Hypertension    took Norvasc for 3 months, doctor discontinued no longer on BP medications   Joint pain    KNEE INJURY, LEFT 01/01/2009   Qualifier: Diagnosis of  By: Delrae Alfred MD, [place]     Migraine    ONYCHOMYCOSIS, TOENAILS 11/22/2008   Qualifier: Diagnosis of  By: Barbaraann Barthel MD, [place]     Shortness of breath    Sleep apnea    TINEA PEDIS 11/22/2008   Qualifier: Diagnosis of  By: Barbaraann Barthel MD, Benetta Spar     Past Surgical History:  Procedure Laterality Date   DILITATION & CURRETTAGE/HYSTROSCOPY WITH NOVASURE ABLATION N/A 07/03/2017   Procedure: DILATATION & CURETTAGE/HYSTEROSCOPY WITH NOVASURE ABLATION;  Surgeon: Genia Del, MD;  Location: [facility];  Service: Gynecology;  Laterality: N/A;   UPPER GI ENDOSCOPY     WISDOM TOOTH EXTRACTION  2008   x4   Patient Active Problem List    Diagnosis Date Noted   Mood disorder (HCC) 12/08/2023   Left foot pain 08/22/2023   Intertrigo 05/07/2023   Carpal tunnel syndrome of right wrist 09/15/2022   Knee pain, right 07/25/2022   Thoracic back pain 04/08/2022   SOBOE (shortness of breath on exertion) 03/10/2022   At risk for activity intolerance 03/10/2022   Skin lesion 02/28/2022   Genetic testing 02/26/2022   At high risk for breast cancer 02/26/2022   PCOS (polycystic ovarian syndrome) 02/07/2022   Abnormal mammogram 02/07/2022   Family history of breast cancer 02/07/2022   Family history of pancreatic cancer 02/07/2022   Family history of prostate cancer 02/07/2022   Chronic pain syndrome 07/26/2021   Polyphagia 01/23/2021   Bursitis 01/01/2021   Pain in both lower extremities 11/16/2020   Peroneal neuropathy 11/16/2020   Class 3 severe obesity with serious comorbidity and body mass index (BMI) greater than or equal to 70 in adult [facility]) 06/10/2017   Fluid retention 06/10/2017   Menorrhagia 05/18/2017   Sacral radiculopathy 02/04/2017   Insulin resistance 02/02/2017   At risk for violence to self  related to depression 02/02/2017   Vitamin D deficiency 11/12/2016   Asthma 07/25/2016   Gout 11/21/2015   BIPOLAR DISORDER UNSPECIFIED 11/22/2008   Depression 12/10/2006   Migraine headache 12/10/2006   GASTROESOPHAGEAL REFLUX, NO ESOPHAGITIS 12/10/2006   OSA (obstructive sleep apnea) 12/10/2006    PCP: Bess Kinds, MD  REFERRING PROVIDER: Bess Kinds, MD  THERAPY DIAG:  Right hip pain  Muscle weakness  Other abnormalities of gait and mobility  Other low back pain  REFERRING DIAG: Right hip pain [M25.551], Greater trochanteric pain syndrome of right lower extremity [M25.551]   Rationale for Evaluation and Treatment:  Rehabilitation  SUBJECTIVE:  PERTINENT PAST HISTORY:  Bipolar, depression, gout, radiculopathy, obesity, chronic pain syndrome, SOBOE        PRECAUTIONS: None  WEIGHT BEARING  RESTRICTIONS No  FALLS:  Has patient fallen in last 6 months? No, Number of falls: 0  MOI/History of condition:  Onset date: 2022  SUBJECTIVE STATEMENT  12/11/2023: Pt is currently moving so her sxs are flared up.  EVAL: Catherine [name] is a 43 y.o. female who presents to clinic with chief complaint of R hip pain.  This is a chronic issue ongoing since 2022.  She was diagnosed with bursitis.  She notes that it is worse with weather changes.  She received an injection in the R hip about 3 weeks ago which was somewhat helpful.    Red flags:  denies   Pain:  Are you having pain? Yes Pain location: R lateral hip to mid lateral thigh  NPRS scale:  1/10 to 10/10 Aggravating factors: standing, steps, inclines, walking Relieving factors: rest Pain description: aching Stage: Chronic 24 hour pattern: NA   Occupation: process medical records, sitting for long periods  Assistive Device: NA  Hand Dominance: NA  Patient Goals/Specific Activities: reduce pain, stand without pain   OBJECTIVE:   DIAGNOSTIC FINDINGS:  None recent  GENERAL OBSERVATION/GAIT: Slow antalgic gait, reduced time in stance on R, WBOS  SENSATION: Light touch: appears intact  PALPATION: Significant TTP R hip   LE MMT:  MMT Right (Eval) Left (Eval)  Hip flexion (L2, L3) 4 4  Knee extension (L3) 4+ 3+*  Knee flexion 4 4  Hip abduction 4 NT  Hip extension    Hip external rotation    Hip internal rotation    Hip adduction    Ankle dorsiflexion (L4)    Ankle plantarflexion (S1)    Ankle inversion    Ankle eversion    Great Toe ext (L5)    Grossly     (Blank rows = not tested, score listed is out of 5 possible points.  N = WNL, D = diminished, C = clear for gross weakness with myotome testing, * = concordant pain with testing)  LE ROM:  ROM Right (Eval) Left (Eval)  Hip flexion    Hip extension    Hip abduction    Hip adduction    Hip internal rotation    Hip external rotation     Knee extension    Knee flexion    Ankle dorsiflexion    Ankle plantarflexion    Ankle inversion    Ankle eversion     (Blank rows = not tested, N = WNL, * = concordant pain with testing)  Functional Tests  Eval  PATIENT SURVEYS:  LEFS: 24/80   TODAY'S TREATMENT: TREATMENT 12/10/23:  Aquatic therapy at [facility]- [address] - therapeutic pool temp 92 degrees Pt enters building independently.  Treatment took place in water 3.8 to  4 ft 8 in.feet deep depending upon activity.  Pt entered and exited the pool via stair and handrails    Aquatic Therapy:  Water walking for warm up fwd/lat/bkwds  Squats - 2x15 Heel raises - 2x15 L stretch EOP - 1' Hip flexor stretch HS stretch Standing hip abd - 2x10 ea Standing hip ext - 2x10 ea Standing hip flexion abd knee straight - 2x10 Standing noodle rotations Yellow noodle push down - 2x10 Lateral squatting with DBs  Pt requires the buoyancy of water for active assisted exercises with buoyancy supported for strengthening and AROM exercises. Hydrostatic pressure also supports joints by unweighting joint load by at least 50 % in 3-4 feet depth water. 80% in chest to neck deep water. Water will provide assistance with movement using the current and laminar flow while the buoyancy reduces weight bearing. Pt requires the viscosity of the water for resistance with strengthening exercises.  HOME EXERCISE PROGRAM: None provided today  Treatment priorities   Eval                                                  ASSESSMENT:  CLINICAL IMPRESSION:  12/11/2023: Session today focused on hip and core strengthening in the aquatic environment for use of buoyancy to offload joints and the viscosity of water as resistance during therapeutic exercise.  Pt with initial high levels of pain when entering pool with higher than normal sxs d/t moving and navigating  steps.  Pain reduced throughout session and with pt reporting fatigue but overall reduced pain.  Patient was able to tolerate all prescribed exercises in the aquatic environment with no adverse effects and reports 4/10 pain at the end of the session. Patient continues to benefit from skilled PT services on land and aquatic based and should be progressed as able to improve functional independence.   EVAL: Camber is a 43 y.o. female who presents to clinic with signs and sxs consistent with R hip pain.  Consistent with GTPS with some element of ITBS.  She is exquisitely tender over GT as well as glute med and TFL.  Given pts high pain and BMI I recommend a round of aquatic therapy to allow more comfortable exercise in a gravity reduced environment.  Pt will benefit from skilled PT to address relevant deficits and improve comfort with daily tasks such as step navigation into her apartment and other functional mobility.      OBJECTIVE IMPAIRMENTS: Pain, hip and LE strength, gait  ACTIVITY LIMITATIONS: standing, walking, steps, squatting, sleeping  PERSONAL FACTORS: See medical history and pertinent history   REHAB POTENTIAL: Good  CLINICAL DECISION MAKING: Evolving/moderate complexity  EVALUATION COMPLEXITY: Moderate   GOALS:   SHORT TERM GOALS: Target date: 12/15/2023   Jamilee will be >75% HEP compliant to improve carryover between sessions and facilitate independent management of condition  Evaluation: ongoing Goal status: INITIAL   LONG TERM GOALS: Target date: 01/12/2024   Telsa will self report >/= 50% decrease in pain from evaluation to improve function in daily tasks  Evaluation/Baseline: 10/10 max pain Goal status: INITIAL   2.  Elka will be able to  navigate 10 x3 steps, not limited by pain, to improve ability to navigate steps going into her apartment  Evaluation/Baseline: unable Goal status: INITIAL   3.  Chinyere will show a >/= 18 pt improvement in LEFS score  (MCID is ~11% or 9 pts) as a proxy for functional improvement   Evaluation/Baseline: 24/80 pts Goal status: INITIAL   4.  Sanaa will report confidence in self management of condition at time of discharge with advanced HEP  Evaluation/Baseline: unable to self manage Goal status: INITIAL   PLAN: PT FREQUENCY: 1-2x/week  PT DURATION: 8 weeks  PLANNED INTERVENTIONS:  97164- PT Re-evaluation, 97110-Therapeutic exercises, 97530- Therapeutic activity, [id number]- Neuromuscular re-education, 97535- Self Care, 16109- Manual therapy, [id number]- Gait training, [id number]- Aquatic Therapy, [id number]- Electrical stimulation (manual), [id number]- Vasopneumatic device, [id number]- Traction (mechanical), [id number]- Ionotophoresis 4mg /ml Dexamethasone, Taping, Dry Needling, Joint manipulation, and Spinal manipulation.   Alphonzo Severance PT, DPT 12/11/2023, 8:30 AM

## 2023-12-17 ENCOUNTER — Ambulatory Visit: Payer: PRIVATE HEALTH INSURANCE | Attending: Sports Medicine | Admitting: Physical Therapy

## 2023-12-17 ENCOUNTER — Encounter: Payer: Self-pay | Admitting: Physical Therapy

## 2023-12-17 DIAGNOSIS — M5459 Other low back pain: Secondary | ICD-10-CM | POA: Diagnosis present

## 2023-12-17 DIAGNOSIS — R2689 Other abnormalities of gait and mobility: Secondary | ICD-10-CM | POA: Diagnosis present

## 2023-12-17 DIAGNOSIS — M25551 Pain in right hip: Secondary | ICD-10-CM | POA: Diagnosis present

## 2023-12-17 DIAGNOSIS — M6281 Muscle weakness (generalized): Secondary | ICD-10-CM | POA: Insufficient documentation

## 2023-12-17 NOTE — Therapy (Signed)
 OUTPATIENT PHYSICAL THERAPY DAILY NOTE  Patient Name: Catherine [name] MRN: [id number] DOB:1981-06-06, 43 y.o., female Today's Date: 12/17/2023   PT End of Session - 12/17/23 1615     Visit Number 3    Number of Visits --   1-2x/week   Date for PT Re-Evaluation 01/12/24    Authorization Type [employer]    Authorization - Number of Visits 30    PT Start Time 0415    PT Stop Time 0456    PT Time Calculation (min) 41 min               Past Medical History:  Diagnosis Date   Allergy    Anxiety    Arthritis    Asthma    since baby, seasonal   ASTHMA, UNSPECIFIED 12/10/2006   Qualifier: Diagnosis of  By: Catherine [name]     CELLULITIS AND ABSCESS OF LEG EXCEPT FOOT 12/30/2007   Qualifier: Diagnosis of  By: Catherine Deutscher FNP, Catherine [name]     Cyst of bone    right side of spine   DEPENDENT EDEMA, LEGS, BILATERAL 11/22/2008   Qualifier: Diagnosis of  By: Catherine Barthel MD, [place]     Depression 2003Dx   Edema    Food allergy    citris   FOOT PAIN, RIGHT 11/22/2008   Qualifier: Diagnosis of  By: Catherine Barthel MD, [place]     Gastric ulcer    GERD (gastroesophageal reflux disease)    Gout    Hypertension    took Norvasc for 3 months, doctor discontinued no longer on BP medications   Joint pain    KNEE INJURY, LEFT 01/01/2009   Qualifier: Diagnosis of  By: Catherine Alfred MD, Catherine [name]     Migraine    ONYCHOMYCOSIS, TOENAILS 11/22/2008   Qualifier: Diagnosis of  By: Catherine Barthel MD, [place]     Shortness of breath    Sleep apnea    TINEA PEDIS 11/22/2008   Qualifier: Diagnosis of  By: Catherine Barthel MD, Catherine [name]     Past Surgical History:  Procedure Laterality Date   DILITATION & CURRETTAGE/HYSTROSCOPY WITH NOVASURE ABLATION N/A 07/03/2017   Procedure: DILATATION & CURETTAGE/HYSTEROSCOPY WITH NOVASURE ABLATION;  Surgeon: Catherine Del, MD;  Location: [facility];  Service: Gynecology;  Laterality: N/A;   UPPER GI ENDOSCOPY     WISDOM TOOTH EXTRACTION  2008   x4   Patient Active Problem List    Diagnosis Date Noted   Mood disorder (HCC) 12/08/2023   Left foot pain 08/22/2023   Intertrigo 05/07/2023   Carpal tunnel syndrome of right wrist 09/15/2022   Knee pain, right 07/25/2022   Thoracic back pain 04/08/2022   SOBOE (shortness of breath on exertion) 03/10/2022   At risk for activity intolerance 03/10/2022   Skin lesion 02/28/2022   Genetic testing 02/26/2022   At high risk for breast cancer 02/26/2022   PCOS (polycystic ovarian syndrome) 02/07/2022   Abnormal mammogram 02/07/2022   Family history of breast cancer 02/07/2022   Family history of pancreatic cancer 02/07/2022   Family history of prostate cancer 02/07/2022   Chronic pain syndrome 07/26/2021   Polyphagia 01/23/2021   Bursitis 01/01/2021   Pain in both lower extremities 11/16/2020   Peroneal neuropathy 11/16/2020   Class 3 severe obesity with serious comorbidity and body mass index (BMI) greater than or equal to 70 in adult [facility]) 06/10/2017   Fluid retention 06/10/2017   Menorrhagia 05/18/2017   Sacral radiculopathy 02/04/2017   Insulin resistance 02/02/2017   At risk for violence to  self related to depression 02/02/2017   Vitamin D deficiency 11/12/2016   Asthma 07/25/2016   Gout 11/21/2015   BIPOLAR DISORDER UNSPECIFIED 11/22/2008   Depression 12/10/2006   Migraine headache 12/10/2006   GASTROESOPHAGEAL REFLUX, NO ESOPHAGITIS 12/10/2006   OSA (obstructive sleep apnea) 12/10/2006    PCP: Catherine Kinds, MD  REFERRING PROVIDER: Marisa Cyphers, MD  THERAPY DIAG:  Right hip pain  Other abnormalities of gait and mobility  Muscle weakness  REFERRING DIAG: Right hip pain [M25.551], Greater trochanteric pain syndrome of right lower extremity [M25.551]   Rationale for Evaluation and Treatment:  Rehabilitation  SUBJECTIVE:  PERTINENT PAST HISTORY:  Bipolar, depression, gout, radiculopathy, obesity, chronic pain syndrome, SOBOE        PRECAUTIONS: None  WEIGHT BEARING RESTRICTIONS  No  FALLS:  Has patient fallen in last 6 months? No, Number of falls: 0  MOI/History of condition:  Onset date: 2022  SUBJECTIVE STATEMENT  12/17/2023: Pt reports that she felt good after last session.  She rates her pain 5-6/10 currently.  EVAL: Catherine [name] is a 43 y.o. female who presents to clinic with chief complaint of R hip pain.  This is a chronic issue ongoing since 2022.  She was diagnosed with bursitis.  She notes that it is worse with weather changes.  She received an injection in the R hip about 3 weeks ago which was somewhat helpful.    Red flags:  denies   Pain:  Are you having pain? Yes Pain location: R lateral hip to mid lateral thigh  NPRS scale:  1/10 to 10/10 Aggravating factors: standing, steps, inclines, walking Relieving factors: rest Pain description: aching Stage: Chronic 24 hour pattern: NA   Occupation: process medical records, sitting for long periods  Assistive Device: NA  Hand Dominance: NA  Patient Goals/Specific Activities: reduce pain, stand without pain   OBJECTIVE:   DIAGNOSTIC FINDINGS:  None recent  GENERAL OBSERVATION/GAIT: Slow antalgic gait, reduced time in stance on R, WBOS  SENSATION: Light touch: appears intact  PALPATION: Significant TTP R hip   LE MMT:  MMT Right (Eval) Left (Eval)  Hip flexion (L2, L3) 4 4  Knee extension (L3) 4+ 3+*  Knee flexion 4 4  Hip abduction 4 NT  Hip extension    Hip external rotation    Hip internal rotation    Hip adduction    Ankle dorsiflexion (L4)    Ankle plantarflexion (S1)    Ankle inversion    Ankle eversion    Great Toe ext (L5)    Grossly     (Blank rows = not tested, score listed is out of 5 possible points.  N = WNL, D = diminished, C = clear for gross weakness with myotome testing, * = concordant pain with testing)  LE ROM:  ROM Right (Eval) Left (Eval)  Hip flexion    Hip extension    Hip abduction    Hip adduction    Hip internal rotation    Hip  external rotation    Knee extension    Knee flexion    Ankle dorsiflexion    Ankle plantarflexion    Ankle inversion    Ankle eversion     (Blank rows = not tested, N = WNL, * = concordant pain with testing)  Functional Tests  Eval  PATIENT SURVEYS:  LEFS: 24/80   TODAY'S TREATMENT: TREATMENT 12/17/2023:  Aquatic therapy at [facility]- [address] - therapeutic pool temp 92 degrees Pt enters building independently.  Treatment took place in water 3.8 to  4 ft 8 in.feet deep depending upon activity.  Pt entered and exited the pool via stair and handrails    Aquatic Therapy:  Water walking for warm up fwd/lat/bkwds  Squats - 2x15 Heel raises - 2x15 L stretch EOP - 1' Hip flexor stretch HS stretch Cross over walking - challenging L LE > R Standing hip abd - 2x15 ea Lateral walking with band at feet TKE with band Standing hip flexion abd knee straight - 2x15 Standing noodle rotations Yellow noodle push down - 2x10  Pt requires the buoyancy of water for active assisted exercises with buoyancy supported for strengthening and AROM exercises. Hydrostatic pressure also supports joints by unweighting joint load by at least 50 % in 3-4 feet depth water. 80% in chest to neck deep water. Water will provide assistance with movement using the current and laminar flow while the buoyancy reduces weight bearing. Pt requires the viscosity of the water for resistance with strengthening exercises.  HOME EXERCISE PROGRAM: None provided today  Treatment priorities   Eval                                                  ASSESSMENT:  CLINICAL IMPRESSION:  12/17/2023: Session today focused on hip and core strengthening in the aquatic environment for use of buoyancy to offload joints and the viscosity of water as resistance during therapeutic exercise.  We were able to add in several new exercises including  resisted lateral walking and TKE.  Pt cued for form with TKE and reports quad fatigue.  Patient was able to tolerate all prescribed exercises in the aquatic environment with no adverse effects and reports 4/10 pain at the end of the session. Patient continues to benefit from skilled PT services on land and aquatic based and should be progressed as able to improve functional independence.   EVAL: Kareen is a 43 y.o. female who presents to clinic with signs and sxs consistent with R hip pain.  Consistent with GTPS with some element of ITBS.  She is exquisitely tender over GT as well as glute med and TFL.  Given pts high pain and BMI I recommend a round of aquatic therapy to allow more comfortable exercise in a gravity reduced environment.  Pt will benefit from skilled PT to address relevant deficits and improve comfort with daily tasks such as step navigation into her apartment and other functional mobility.      OBJECTIVE IMPAIRMENTS: Pain, hip and LE strength, gait  ACTIVITY LIMITATIONS: standing, walking, steps, squatting, sleeping  PERSONAL FACTORS: See medical history and pertinent history   REHAB POTENTIAL: Good  CLINICAL DECISION MAKING: Evolving/moderate complexity  EVALUATION COMPLEXITY: Moderate   GOALS:   SHORT TERM GOALS: Target date: 12/15/2023   Paidyn will be >75% HEP compliant to improve carryover between sessions and facilitate independent management of condition  Evaluation: ongoing Goal status: MET   LONG TERM GOALS: Target date: 01/12/2024   Disney will self report >/= 50% decrease in pain from evaluation to improve function in daily tasks  Evaluation/Baseline: 10/10 max pain Goal status: INITIAL   2.  Skiler will be able to  navigate 10 x3 steps, not limited by pain, to improve ability to navigate steps going into her apartment  Evaluation/Baseline: unable Goal status: INITIAL   3.  Chyanna will show a >/= 18 pt improvement in LEFS score (MCID is ~11% or  9 pts) as a proxy for functional improvement   Evaluation/Baseline: 24/80 pts Goal status: INITIAL   4.  Jenean will report confidence in self management of condition at time of discharge with advanced HEP  Evaluation/Baseline: unable to self manage Goal status: INITIAL   PLAN: PT FREQUENCY: 1-2x/week  PT DURATION: 8 weeks  PLANNED INTERVENTIONS:  97164- PT Re-evaluation, 97110-Therapeutic exercises, 97530- Therapeutic activity, [id number]- Neuromuscular re-education, 97535- Self Care, 16109- Manual therapy, [id number]- Gait training, [id number]- Aquatic Therapy, [id number]- Electrical stimulation (manual), [id number]- Vasopneumatic device, [id number]- Traction (mechanical), [id number]- Ionotophoresis 4mg /ml Dexamethasone, Taping, Dry Needling, Joint manipulation, and Spinal manipulation.   Alphonzo Severance PT, DPT 12/17/2023, 4:56 PM

## 2023-12-22 ENCOUNTER — Other Ambulatory Visit: Payer: Self-pay | Admitting: Student

## 2023-12-24 ENCOUNTER — Encounter: Payer: Self-pay | Admitting: Physical Therapy

## 2023-12-24 ENCOUNTER — Ambulatory Visit: Payer: PRIVATE HEALTH INSURANCE | Admitting: Physical Therapy

## 2023-12-24 DIAGNOSIS — R2689 Other abnormalities of gait and mobility: Secondary | ICD-10-CM

## 2023-12-24 DIAGNOSIS — M25551 Pain in right hip: Secondary | ICD-10-CM | POA: Diagnosis not present

## 2023-12-24 DIAGNOSIS — M5459 Other low back pain: Secondary | ICD-10-CM

## 2023-12-24 DIAGNOSIS — M6281 Muscle weakness (generalized): Secondary | ICD-10-CM

## 2023-12-24 NOTE — Therapy (Signed)
 OUTPATIENT PHYSICAL THERAPY DAILY NOTE  Patient Name: Catherine [name] MRN: [id number] DOB:[birth year], 43 y.o., female Today's Date: 12/25/2023   PT End of Session - 12/24/23 1620     Visit Number 4    Number of Visits --   1-2x/week   Date for PT Re-Evaluation 01/12/24    Authorization Type [employer]    Authorization - Number of Visits 30    PT Start Time 0415    PT Stop Time 0456    PT Time Calculation (min) 41 min                Past Medical History:  Diagnosis Date   Allergy    Anxiety    Arthritis    Asthma    since baby, seasonal   ASTHMA, UNSPECIFIED 12/10/2006   Qualifier: Diagnosis of  By: Abundio Miu     CELLULITIS AND ABSCESS OF LEG EXCEPT FOOT 12/30/2007   Qualifier: Diagnosis of  By: Daphine Deutscher FNP, [place]     Cyst of bone    right side of spine   DEPENDENT EDEMA, LEGS, BILATERAL 11/22/2008   Qualifier: Diagnosis of  By: Barbaraann Barthel MD, [place]     Depression 2003Dx   Edema    Food allergy    citris   FOOT PAIN, RIGHT 11/22/2008   Qualifier: Diagnosis of  By: Barbaraann Barthel MD, [place]     Gastric ulcer    GERD (gastroesophageal reflux disease)    Gout    Hypertension    took Norvasc for 3 months, doctor discontinued no longer on BP medications   Joint pain    KNEE INJURY, LEFT 01/01/2009   Qualifier: Diagnosis of  By: Delrae Alfred MD, [place]     Migraine    ONYCHOMYCOSIS, TOENAILS 11/22/2008   Qualifier: Diagnosis of  By: Barbaraann Barthel MD, [place]     Shortness of breath    Sleep apnea    TINEA PEDIS 11/22/2008   Qualifier: Diagnosis of  By: Barbaraann Barthel MD, Benetta Spar     Past Surgical History:  Procedure Laterality Date   DILITATION & CURRETTAGE/HYSTROSCOPY WITH NOVASURE ABLATION N/A 07/03/2017   Procedure: DILATATION & CURETTAGE/HYSTEROSCOPY WITH NOVASURE ABLATION;  Surgeon: Genia Del, MD;  Location: [facility];  Service: Gynecology;  Laterality: N/A;   UPPER GI ENDOSCOPY     WISDOM TOOTH EXTRACTION  2008   x4   Patient Active Problem List    Diagnosis Date Noted   Mood disorder (HCC) 12/08/2023   Left foot pain 08/22/2023   Intertrigo 05/07/2023   Carpal tunnel syndrome of right wrist 09/15/2022   Knee pain, right 07/25/2022   Thoracic back pain 04/08/2022   SOBOE (shortness of breath on exertion) 03/10/2022   At risk for activity intolerance 03/10/2022   Skin lesion 02/28/2022   Genetic testing 02/26/2022   At high risk for breast cancer 02/26/2022   PCOS (polycystic ovarian syndrome) 02/07/2022   Abnormal mammogram 02/07/2022   Family history of breast cancer 02/07/2022   Family history of pancreatic cancer 02/07/2022   Family history of prostate cancer 02/07/2022   Chronic pain syndrome 07/26/2021   Polyphagia 01/23/2021   Bursitis 01/01/2021   Pain in both lower extremities 11/16/2020   Peroneal neuropathy 11/16/2020   Class 3 severe obesity with serious comorbidity and body mass index (BMI) greater than or equal to 70 in adult [facility]) 06/10/2017   Fluid retention 06/10/2017   Menorrhagia 05/18/2017   Sacral radiculopathy 02/04/2017   Insulin resistance 02/02/2017   At risk for violence  to self related to depression 02/02/2017   Vitamin D deficiency 11/12/2016   Asthma 07/25/2016   Gout 11/21/2015   BIPOLAR DISORDER UNSPECIFIED 11/22/2008   Depression 12/10/2006   Migraine headache 12/10/2006   GASTROESOPHAGEAL REFLUX, NO ESOPHAGITIS 12/10/2006   OSA (obstructive sleep apnea) 12/10/2006    PCP: Bess Kinds, MD  REFERRING PROVIDER: Marisa Cyphers, MD  THERAPY DIAG:  Right hip pain  Other abnormalities of gait and mobility  Muscle weakness  Other low back pain  REFERRING DIAG: Right hip pain [M25.551], Greater trochanteric pain syndrome of right lower extremity [M25.551]   Rationale for Evaluation and Treatment:  Rehabilitation  SUBJECTIVE:  PERTINENT PAST HISTORY:  Bipolar, depression, gout, radiculopathy, obesity, chronic pain syndrome, SOBOE        PRECAUTIONS: None  WEIGHT BEARING  RESTRICTIONS No  FALLS:  Has patient fallen in last 6 months? No, Number of falls: 0  MOI/History of condition:  Onset date: 2022  SUBJECTIVE STATEMENT  12/25/2023: Pt reports that she felt good after last session.  She rates her pain 5-6/10 currently.  EVAL: Catherine [name] is a 43 y.o. female who presents to clinic with chief complaint of R hip pain.  This is a chronic issue ongoing since 2022.  She was diagnosed with bursitis.  She notes that it is worse with weather changes.  She received an injection in the R hip about 3 weeks ago which was somewhat helpful.    Red flags:  denies   Pain:  Are you having pain? Yes Pain location: R lateral hip to mid lateral thigh  NPRS scale:  1/10 to 10/10 Aggravating factors: standing, steps, inclines, walking Relieving factors: rest Pain description: aching Stage: Chronic 24 hour pattern: NA   Occupation: process medical records, sitting for long periods  Assistive Device: NA  Hand Dominance: NA  Patient Goals/Specific Activities: reduce pain, stand without pain   OBJECTIVE:   DIAGNOSTIC FINDINGS:  None recent  GENERAL OBSERVATION/GAIT: Slow antalgic gait, reduced time in stance on R, WBOS  SENSATION: Light touch: appears intact  PALPATION: Significant TTP R hip   LE MMT:  MMT Right (Eval) Left (Eval)  Hip flexion (L2, L3) 4 4  Knee extension (L3) 4+ 3+*  Knee flexion 4 4  Hip abduction 4 NT  Hip extension    Hip external rotation    Hip internal rotation    Hip adduction    Ankle dorsiflexion (L4)    Ankle plantarflexion (S1)    Ankle inversion    Ankle eversion    Great Toe ext (L5)    Grossly     (Blank rows = not tested, score listed is out of 5 possible points.  N = WNL, D = diminished, C = clear for gross weakness with myotome testing, * = concordant pain with testing)  LE ROM:  ROM Right (Eval) Left (Eval)  Hip flexion    Hip extension    Hip abduction    Hip adduction    Hip internal  rotation    Hip external rotation    Knee extension    Knee flexion    Ankle dorsiflexion    Ankle plantarflexion    Ankle inversion    Ankle eversion     (Blank rows = not tested, N = WNL, * = concordant pain with testing)  Functional Tests  Eval  PATIENT SURVEYS:  LEFS: 24/80   TODAY'S TREATMENT: TREATMENT 12/25/2023:  Aquatic therapy at [facility]- [address] - therapeutic pool temp 92 degrees Pt enters building independently.  Treatment took place in water 3.8 to  4 ft 8 in.feet deep depending upon activity.  Pt entered and exited the pool via stair and handrails    Aquatic Therapy:  Water walking for warm up fwd/lat/bkwds  Squats - 2x15 Heel raises - 2x15 Hip flexor stretch HS stretch Cross over walking - challenging L LE > R Standing hip abd circles - x15 ea Standing hip flexion ext knee straight - 2x15 S/L heel raise Yellow noodle stomp Yellow noodle push down - 2x10  Pt requires the buoyancy of water for active assisted exercises with buoyancy supported for strengthening and AROM exercises. Hydrostatic pressure also supports joints by unweighting joint load by at least 50 % in 3-4 feet depth water. 80% in chest to neck deep water. Water will provide assistance with movement using the current and laminar flow while the buoyancy reduces weight bearing. Pt requires the viscosity of the water for resistance with strengthening exercises.  HOME EXERCISE PROGRAM: None provided today  Treatment priorities   Eval                                                  ASSESSMENT:  CLINICAL IMPRESSION:  12/25/2023: Session today focused on hip and core strengthening in the aquatic environment for use of buoyancy to offload joints and the viscosity of water as resistance during therapeutic exercise.  Marigrace continues to make good progress with overall exercise volume and intensity.  She  is reporting less discomfort during exercises and is able to navigate the steps in and out of the pool with less pain.  Patient was able to tolerate all prescribed exercises in the aquatic environment with no adverse effects and reports 4/10 pain at the end of the session. Patient continues to benefit from skilled PT services on land and aquatic based and should be progressed as able to improve functional independence.   EVAL: Amaia is a 43 y.o. female who presents to clinic with signs and sxs consistent with R hip pain.  Consistent with GTPS with some element of ITBS.  She is exquisitely tender over GT as well as glute med and TFL.  Given pts high pain and BMI I recommend a round of aquatic therapy to allow more comfortable exercise in a gravity reduced environment.  Pt will benefit from skilled PT to address relevant deficits and improve comfort with daily tasks such as step navigation into her apartment and other functional mobility.      OBJECTIVE IMPAIRMENTS: Pain, hip and LE strength, gait  ACTIVITY LIMITATIONS: standing, walking, steps, squatting, sleeping  PERSONAL FACTORS: See medical history and pertinent history   REHAB POTENTIAL: Good  CLINICAL DECISION MAKING: Evolving/moderate complexity  EVALUATION COMPLEXITY: Moderate   GOALS:   SHORT TERM GOALS: Target date: 12/15/2023   Adlee will be >75% HEP compliant to improve carryover between sessions and facilitate independent management of condition  Evaluation: ongoing Goal status: MET   LONG TERM GOALS: Target date: 01/12/2024   Darilyn will self report >/= 50% decrease in pain from evaluation to improve function in daily tasks  Evaluation/Baseline: 10/10 max pain Goal status: INITIAL   2.  Zaina will be able to  navigate 10 x3 steps, not limited by pain, to improve ability to navigate steps going into her apartment  Evaluation/Baseline: unable Goal status: INITIAL   3.  Kalin will show a >/= 18 pt  improvement in LEFS score (MCID is ~11% or 9 pts) as a proxy for functional improvement   Evaluation/Baseline: 24/80 pts Goal status: INITIAL   4.  Tareka will report confidence in self management of condition at time of discharge with advanced HEP  Evaluation/Baseline: unable to self manage Goal status: INITIAL   PLAN: PT FREQUENCY: 1-2x/week  PT DURATION: 8 weeks  PLANNED INTERVENTIONS:  97164- PT Re-evaluation, 97110-Therapeutic exercises, 97530- Therapeutic activity, [id number]- Neuromuscular re-education, 97535- Self Care, 01027- Manual therapy, [id number]- Gait training, [id number]- Aquatic Therapy, [phone]- Electrical stimulation (manual), [id number]- Vasopneumatic device, [id number]- Traction (mechanical), [id number]- Ionotophoresis 4mg /ml Dexamethasone, Taping, Dry Needling, Joint manipulation, and Spinal manipulation.   Alphonzo Severance PT, DPT 12/25/2023, 8:41 AM

## 2023-12-25 ENCOUNTER — Encounter: Payer: Self-pay | Admitting: Physical Therapy

## 2023-12-31 ENCOUNTER — Ambulatory Visit: Payer: PRIVATE HEALTH INSURANCE | Admitting: Physical Therapy

## 2023-12-31 ENCOUNTER — Telehealth: Payer: Self-pay | Admitting: Physical Therapy

## 2024-01-07 ENCOUNTER — Ambulatory Visit: Payer: PRIVATE HEALTH INSURANCE | Admitting: Physical Therapy

## 2024-01-07 ENCOUNTER — Encounter: Payer: Self-pay | Admitting: Physical Therapy

## 2024-01-07 DIAGNOSIS — M6281 Muscle weakness (generalized): Secondary | ICD-10-CM

## 2024-01-07 DIAGNOSIS — M25551 Pain in right hip: Secondary | ICD-10-CM | POA: Diagnosis not present

## 2024-01-07 DIAGNOSIS — R2689 Other abnormalities of gait and mobility: Secondary | ICD-10-CM

## 2024-01-07 NOTE — Therapy (Unsigned)
 OUTPATIENT PHYSICAL THERAPY DAILY NOTE  Patient Name: Catherine [name] MRN: [id number] DOB:21-Apr-1981, 43 y.o., female Today's Date: 01/08/2024   PT End of Session - 01/07/24 1609     Visit Number 5    Number of Visits --   1-2x/week   Date for PT Re-Evaluation 03/04/24    Authorization Type [employer]    Authorization - Number of Visits 30    PT Start Time 1615    PT Stop Time 1700    PT Time Calculation (min) 45 min                Past Medical History:  Diagnosis Date   Allergy    Anxiety    Arthritis    Asthma    since baby, seasonal   ASTHMA, UNSPECIFIED 12/10/2006   Qualifier: Diagnosis of  By: Catherine [name]     CELLULITIS AND ABSCESS OF LEG EXCEPT FOOT 12/30/2007   Qualifier: Diagnosis of  By: Catherine [name]     Cyst of bone    right side of spine   DEPENDENT EDEMA, LEGS, BILATERAL 11/22/2008   Qualifier: Diagnosis of  By: Catherine Barthel MD, [place]     Depression 2003Dx   Edema    Food allergy    citris   FOOT PAIN, RIGHT 11/22/2008   Qualifier: Diagnosis of  By: Catherine Barthel MD, [place]     Gastric ulcer    GERD (gastroesophageal reflux disease)    Gout    Hypertension    took Norvasc for 3 months, doctor discontinued no longer on BP medications   Joint pain    KNEE INJURY, LEFT 01/01/2009   Qualifier: Diagnosis of  By: Catherine Alfred MD, Catherine [name]     Migraine    ONYCHOMYCOSIS, TOENAILS 11/22/2008   Qualifier: Diagnosis of  By: Catherine Barthel MD, [place]     Shortness of breath    Sleep apnea    TINEA PEDIS 11/22/2008   Qualifier: Diagnosis of  By: Catherine Barthel MD, Benetta Spar     Past Surgical History:  Procedure Laterality Date   DILITATION & CURRETTAGE/HYSTROSCOPY WITH NOVASURE ABLATION N/A 07/03/2017   Procedure: DILATATION & CURETTAGE/HYSTEROSCOPY WITH NOVASURE ABLATION;  Surgeon: Catherine Del, MD;  Location: [facility];  Service: Gynecology;  Laterality: N/A;   UPPER GI ENDOSCOPY     WISDOM TOOTH EXTRACTION  2008   x4   Patient Active Problem List    Diagnosis Date Noted   Mood disorder (HCC) 12/08/2023   Left foot pain 08/22/2023   Intertrigo 05/07/2023   Carpal tunnel syndrome of right wrist 09/15/2022   Knee pain, right 07/25/2022   Thoracic back pain 04/08/2022   SOBOE (shortness of breath on exertion) 03/10/2022   At risk for activity intolerance 03/10/2022   Skin lesion 02/28/2022   Genetic testing 02/26/2022   At high risk for breast cancer 02/26/2022   PCOS (polycystic ovarian syndrome) 02/07/2022   Abnormal mammogram 02/07/2022   Family history of breast cancer 02/07/2022   Family history of pancreatic cancer 02/07/2022   Family history of prostate cancer 02/07/2022   Chronic pain syndrome 07/26/2021   Polyphagia 01/23/2021   Bursitis 01/01/2021   Pain in both lower extremities 11/16/2020   Peroneal neuropathy 11/16/2020   Class 3 severe obesity with serious comorbidity and body mass index (BMI) greater than or equal to 70 in adult [facility]) 06/10/2017   Fluid retention 06/10/2017   Menorrhagia 05/18/2017   Sacral radiculopathy 02/04/2017   Insulin resistance 02/02/2017   At risk for violence  to self related to depression 02/02/2017   Vitamin D deficiency 11/12/2016   Asthma 07/25/2016   Gout 11/21/2015   BIPOLAR DISORDER UNSPECIFIED 11/22/2008   Depression 12/10/2006   Migraine headache 12/10/2006   GASTROESOPHAGEAL REFLUX, NO ESOPHAGITIS 12/10/2006   OSA (obstructive sleep apnea) 12/10/2006    PCP: Catherine Kinds, MD  REFERRING PROVIDER: Marisa Cyphers, MD  THERAPY DIAG:  Right hip pain - Plan: PT plan of care cert/re-cert  Other abnormalities of gait and mobility - Plan: PT plan of care cert/re-cert  Muscle weakness - Plan: PT plan of care cert/re-cert  REFERRING DIAG: Right hip pain [M25.551], Greater trochanteric pain syndrome of right lower extremity [M25.551]   Rationale for Evaluation and Treatment:  Rehabilitation  SUBJECTIVE:  PERTINENT PAST HISTORY:  Bipolar, depression, gout,  radiculopathy, obesity, chronic pain syndrome, SOBOE        PRECAUTIONS: None  WEIGHT BEARING RESTRICTIONS No  FALLS:  Has patient fallen in last 6 months? No, Number of falls: 0  MOI/History of condition:  Onset date: 2022  SUBJECTIVE STATEMENT  01/08/2024: Pt reports that she is doing well overall.  She was able walk from the parking lot to the pool today.  EVAL: Catherine [name] is a 43 y.o. female who presents to clinic with chief complaint of R hip pain.  This is a chronic issue ongoing since 2022.  She was diagnosed with bursitis.  She notes that it is worse with weather changes.  She received an injection in the R hip about 3 weeks ago which was somewhat helpful.    Red flags:  denies   Pain:  Are you having pain? Yes Pain location: R lateral hip to mid lateral thigh  NPRS scale:  1/10 to 10/10 Aggravating factors: standing, steps, inclines, walking Relieving factors: rest Pain description: aching Stage: Chronic 24 hour pattern: NA   Occupation: process medical records, sitting for long periods  Assistive Device: NA  Hand Dominance: NA  Patient Goals/Specific Activities: reduce pain, stand without pain   OBJECTIVE:   DIAGNOSTIC FINDINGS:  None recent  GENERAL OBSERVATION/GAIT: Slow antalgic gait, reduced time in stance on R, WBOS  SENSATION: Light touch: appears intact  PALPATION: Significant TTP R hip   LE MMT:  MMT Right (Eval) Left (Eval)  Hip flexion (L2, L3) 4 4  Knee extension (L3) 4+ 3+*  Knee flexion 4 4  Hip abduction 4 NT  Hip extension    Hip external rotation    Hip internal rotation    Hip adduction    Ankle dorsiflexion (L4)    Ankle plantarflexion (S1)    Ankle inversion    Ankle eversion    Great Toe ext (L5)    Grossly     (Blank rows = not tested, score listed is out of 5 possible points.  N = WNL, D = diminished, C = clear for gross weakness with myotome testing, * = concordant pain with testing)  LE ROM:  ROM  Right (Eval) Left (Eval)  Hip flexion    Hip extension    Hip abduction    Hip adduction    Hip internal rotation    Hip external rotation    Knee extension    Knee flexion    Ankle dorsiflexion    Ankle plantarflexion    Ankle inversion    Ankle eversion     (Blank rows = not tested, N = WNL, * = concordant pain with testing)  Functional Tests  Eval  PATIENT SURVEYS:  LEFS: 24/80   TODAY'S TREATMENT: TREATMENT 01/08/2024:  Aquatic therapy at [facility]- [address] - therapeutic pool temp 92 degrees Pt enters building independently.  Treatment took place in water 3.8 to  4 ft 8 in.feet deep depending upon activity.  Pt entered and exited the pool via stair and handrails    Aquatic Therapy:  Water walking for warm up fwd/lat/bkwds  Squats - 2x15 Heel raises - 2x15 Hip flexor stretch HS stretch Cross over walking - challenging L LE > R Standing hip abd circles - x15 ea Standing hip flexion ext knee straight - 2x15 Yellow noodle stomp Yellow noodle HS stretch Yellow noodle push down - 2x10 Kickboard push-pull Waterbell narrow BOS shoulde ext and punches  Pt requires the buoyancy of water for active assisted exercises with buoyancy supported for strengthening and AROM exercises. Hydrostatic pressure also supports joints by unweighting joint load by at least 50 % in 3-4 feet depth water. 80% in chest to neck deep water. Water will provide assistance with movement using the current and laminar flow while the buoyancy reduces weight bearing. Pt requires the viscosity of the water for resistance with strengthening exercises.  HOME EXERCISE PROGRAM: None provided today  Treatment priorities   Eval                                                  ASSESSMENT:  CLINICAL IMPRESSION:  01/08/2024: Upon goal recheck Janny is making progress toward her goals.  She was able to move  apartments which required quite a bit of step navigation and bending.  She is now in a single story house which does help her move around more and is less agging on her knees and hips.  She has had some significant improvement in her pain, but still has flare ups of severe pain.  We are working on a plan for her to begin aquatic exercise independently.  Extending POC for a few more visits to allow for make up of missed visit and ensure she is independent with aquatic therapy.  EVAL: Jomaira is a 43 y.o. female who presents to clinic with signs and sxs consistent with R hip pain.  Consistent with GTPS with some element of ITBS.  She is exquisitely tender over GT as well as glute med and TFL.  Given pts high pain and BMI I recommend a round of aquatic therapy to allow more comfortable exercise in a gravity reduced environment.  Pt will benefit from skilled PT to address relevant deficits and improve comfort with daily tasks such as step navigation into her apartment and other functional mobility.      OBJECTIVE IMPAIRMENTS: Pain, hip and LE strength, gait  ACTIVITY LIMITATIONS: standing, walking, steps, squatting, sleeping  PERSONAL FACTORS: See medical history and pertinent history   REHAB POTENTIAL: Good  CLINICAL DECISION MAKING: Evolving/moderate complexity  EVALUATION COMPLEXITY: Moderate   GOALS:   SHORT TERM GOALS: Target date: 12/15/2023   Takeia will be >75% HEP compliant to improve carryover between sessions and facilitate independent management of condition  Evaluation: ongoing Goal status: MET   LONG TERM GOALS: Target date: 01/12/2024 extended to 03/04/2024    Roxana will self report >/= 50% decrease in pain from evaluation to improve function in daily tasks  Evaluation/Baseline: 10/10 max pain 3/27: some improvement in pain, but there  are still days where it is quite limiting Goal status: ongoing   2.  Brennyn will be able to navigate 10 x3 steps, not limited by pain,  to improve ability to navigate steps going into her apartment  Evaluation/Baseline: unable 3/28: able to navigate, but with pain and difficulty Goal status: ongoing   3.  Artelia will show a >/= 18 pt improvement in LEFS score (MCID is ~11% or 9 pts) as a proxy for functional improvement   Evaluation/Baseline: 24/80 pts Goal status: INITIAL   4.  Vianca will report confidence in self management of condition at time of discharge with advanced HEP  Evaluation/Baseline: unable to self manage 3/28: ongoing Goal status: onging   PLAN: PT FREQUENCY: 1-2x/week  PT DURATION: 8 weeks  PLANNED INTERVENTIONS:  97164- PT Re-evaluation, 97110-Therapeutic exercises, 97530- Therapeutic activity, 97112- Neuromuscular re-education, 97535- Self Care, 34742- Manual therapy, [id number]- Gait training, [id number]- Aquatic Therapy, [id number]- Electrical stimulation (manual), [id number]- Vasopneumatic device, [id number]- Traction (mechanical), [id number]- Ionotophoresis 4mg /ml Dexamethasone, Taping, Dry Needling, Joint manipulation, and Spinal manipulation.   Alphonzo Severance PT, DPT 01/08/2024, 7:23 AM

## 2024-01-08 ENCOUNTER — Ambulatory Visit (INDEPENDENT_AMBULATORY_CARE_PROVIDER_SITE_OTHER): Payer: No Typology Code available for payment source | Admitting: Student

## 2024-01-08 DIAGNOSIS — F39 Unspecified mood [affective] disorder: Secondary | ICD-10-CM | POA: Diagnosis not present

## 2024-01-08 MED ORDER — HYDROXYZINE HCL 10 MG PO TABS: 10.0000 mg | ORAL_TABLET | Freq: Three times a day (TID) | ORAL | 0 refills | Status: DC | PRN

## 2024-01-08 NOTE — Patient Instructions (Signed)
 It was great to see you! Thank you for allowing me to participate in your care!  I recommend that you always bring your medications to each appointment as this makes it easy to ensure you are on the correct medications and helps [name] not miss when refills are needed.  Our plans for today:  - Take Seroquel nightly - Continue atarax as needed for anxiety - f/u with psychiatry in May and continue therapy    Take care and seek immediate care sooner if you develop any concerns.   Dr. Erick Alley, DO [facility]

## 2024-01-08 NOTE — Progress Notes (Signed)
    SUBJECTIVE:   CHIEF COMPLAINT / HPI:   The patient, with a history of anxiety and depression, presents for a psychiatric follow-up. She reports that she has been taking Seroquel but only as needed and Atarax for anxiety, which she finds helpful.  She takes Atarax in the morning before work and sometimes when she feels an anxiety attack coming on. She reports that the 10mg  dose of Atarax is effective and she does not feel the need for a higher dose.  She has been taking Seroquel on evenings when she feels particularly tired and at risk of a panic attack.  She has an appointment with a psychiatrist scheduled for May 1st.  The patient has also started therapy and has had three sessions so far. She is trying to get used to the process and has communicated to her therapist that she needs to be pushed and given challenging tasks. She reports that she is finding the therapy helpful.   The patient has started seeing a wellness coach and has begun a weight loss program at [facility].  As part of this program, she has been prescribed vitamin D3, semaglutide injections, and testosterone injections. She also receives B12 shots for energy. She reports that she is feeling motivated and is happy with the progress she is making.   PERTINENT  PMH / PSH: Bipolar, anxiety, obesity   OBJECTIVE:   BP 129/84   Pulse 90   Ht 5\' 7"  (1.702 m)   Wt (!) 484 lb 9.6 oz (219.8 kg)   SpO2 97%   BMI 75.90 kg/m    General: NAD, pleasant Cardiac: Well-perfused Respiratory: Breathing comfortably on room air Neuro: alert, no obvious focal deficits Psych: Normal affect and mood  ASSESSMENT/PLAN:   Mood disorder (HCC) Improvement in mood/anxiety with as needed Atarax and therapy.  She is open to trying Seroquel 50 mg daily as I am hesitant to start her on an SSRI given previous diagnosis of bipolar.  - Refill Atarax for as-needed use - seroquel 50 mg qhs - Continue therapy sessions - Follow up with psychiatry on  May 1st.  - Return to further discuss if desired      Dr. Erick Alley, DO [facility]

## 2024-01-10 NOTE — Assessment & Plan Note (Addendum)
 Improvement in mood/anxiety with as needed Atarax and therapy.  She is open to trying Seroquel 50 mg daily as I am hesitant to start her on an SSRI given previous diagnosis of bipolar.  - Refill Atarax for as-needed use - seroquel 50 mg qhs - Continue therapy sessions - Follow up with psychiatry on May 1st.  - Return to further discuss if desired

## 2024-01-14 ENCOUNTER — Ambulatory Visit: Payer: PRIVATE HEALTH INSURANCE | Attending: Sports Medicine | Admitting: Physical Therapy

## 2024-01-14 ENCOUNTER — Ambulatory Visit (HOSPITAL_COMMUNITY): Payer: Self-pay | Admitting: Psychiatry

## 2024-01-14 ENCOUNTER — Encounter: Payer: Self-pay | Admitting: Physical Therapy

## 2024-01-14 DIAGNOSIS — M25551 Pain in right hip: Secondary | ICD-10-CM | POA: Insufficient documentation

## 2024-01-14 DIAGNOSIS — M6281 Muscle weakness (generalized): Secondary | ICD-10-CM | POA: Diagnosis present

## 2024-01-14 DIAGNOSIS — R2689 Other abnormalities of gait and mobility: Secondary | ICD-10-CM | POA: Insufficient documentation

## 2024-01-14 NOTE — Therapy (Signed)
 OUTPATIENT PHYSICAL THERAPY DAILY NOTE  Patient Name: Catherine [name] MRN: [id number] DOB:1980-11-16, 43 y.o., female Today's Date: 01/15/2024   PT End of Session - 01/14/24 1614     Visit Number 6    Number of Visits --   1-2x/week   Date for PT Re-Evaluation 03/04/24    Authorization Type [employer]    Authorization - Number of Visits 30    PT Start Time 0415    PT Stop Time 0457    PT Time Calculation (min) 42 min                 Past Medical History:  Diagnosis Date   Allergy    Anxiety    Arthritis    Asthma    since baby, seasonal   ASTHMA, UNSPECIFIED 12/10/2006   Qualifier: Diagnosis of  By: Abundio Miu     CELLULITIS AND ABSCESS OF LEG EXCEPT FOOT 12/30/2007   Qualifier: Diagnosis of  By: Daphine Deutscher FNP, [place]     Cyst of bone    right side of spine   DEPENDENT EDEMA, LEGS, BILATERAL 11/22/2008   Qualifier: Diagnosis of  By: Barbaraann Barthel MD, [place]     Depression 2003Dx   Edema    Food allergy    citris   FOOT PAIN, RIGHT 11/22/2008   Qualifier: Diagnosis of  By: Barbaraann Barthel MD, [place]     Gastric ulcer    GERD (gastroesophageal reflux disease)    Gout    Hypertension    took Norvasc for 3 months, doctor discontinued no longer on BP medications   Joint pain    KNEE INJURY, LEFT 01/01/2009   Qualifier: Diagnosis of  By: Delrae Alfred MD, [place]     Migraine    ONYCHOMYCOSIS, TOENAILS 11/22/2008   Qualifier: Diagnosis of  By: Barbaraann Barthel MD, [place]     Shortness of breath    Sleep apnea    TINEA PEDIS 11/22/2008   Qualifier: Diagnosis of  By: Barbaraann Barthel MD, Benetta Spar     Past Surgical History:  Procedure Laterality Date   DILITATION & CURRETTAGE/HYSTROSCOPY WITH NOVASURE ABLATION N/A 07/03/2017   Procedure: DILATATION & CURETTAGE/HYSTEROSCOPY WITH NOVASURE ABLATION;  Surgeon: Genia Del, MD;  Location: [facility];  Service: Gynecology;  Laterality: N/A;   UPPER GI ENDOSCOPY     WISDOM TOOTH EXTRACTION  2008   x4   Patient Active Problem List    Diagnosis Date Noted   Mood disorder (HCC) 12/08/2023   Left foot pain 08/22/2023   Intertrigo 05/07/2023   Carpal tunnel syndrome of right wrist 09/15/2022   Knee pain, right 07/25/2022   Thoracic back pain 04/08/2022   SOBOE (shortness of breath on exertion) 03/10/2022   At risk for activity intolerance 03/10/2022   Skin lesion 02/28/2022   Genetic testing 02/26/2022   At high risk for breast cancer 02/26/2022   PCOS (polycystic ovarian syndrome) 02/07/2022   Abnormal mammogram 02/07/2022   Family history of breast cancer 02/07/2022   Family history of pancreatic cancer 02/07/2022   Family history of prostate cancer 02/07/2022   Chronic pain syndrome 07/26/2021   Polyphagia 01/23/2021   Bursitis 01/01/2021   Pain in both lower extremities 11/16/2020   Peroneal neuropathy 11/16/2020   Class 3 severe obesity with serious comorbidity and body mass index (BMI) greater than or equal to 70 in adult [facility]) 06/10/2017   Fluid retention 06/10/2017   Menorrhagia 05/18/2017   Sacral radiculopathy 02/04/2017   Insulin resistance 02/02/2017   At risk for  violence to self related to depression 02/02/2017   Vitamin D deficiency 11/12/2016   Asthma 07/25/2016   Gout 11/21/2015   BIPOLAR DISORDER UNSPECIFIED 11/22/2008   Depression 12/10/2006   Migraine headache 12/10/2006   GASTROESOPHAGEAL REFLUX, NO ESOPHAGITIS 12/10/2006   OSA (obstructive sleep apnea) 12/10/2006    PCP: Bess Kinds, MD  REFERRING PROVIDER: Marisa Cyphers, MD  THERAPY DIAG:  Right hip pain  Other abnormalities of gait and mobility  Muscle weakness  REFERRING DIAG: Right hip pain [M25.551], Greater trochanteric pain syndrome of right lower extremity [M25.551]   Rationale for Evaluation and Treatment:  Rehabilitation  SUBJECTIVE:  PERTINENT PAST HISTORY:  Bipolar, depression, gout, radiculopathy, obesity, chronic pain syndrome, SOBOE        PRECAUTIONS: None  WEIGHT BEARING RESTRICTIONS  No  FALLS:  Has patient fallen in last 6 months? No, Number of falls: 0  MOI/History of condition:  Onset date: 2022  SUBJECTIVE STATEMENT  01/15/2024: Pt reports that she is doing well today.  EVAL: Catherine [name] is a 43 y.o. female who presents to clinic with chief complaint of R hip pain.  This is a chronic issue ongoing since 2022.  She was diagnosed with bursitis.  She notes that it is worse with weather changes.  She received an injection in the R hip about 3 weeks ago which was somewhat helpful.    Red flags:  denies   Pain:  Are you having pain? Yes Pain location: R lateral hip to mid lateral thigh  NPRS scale:  1/10 to 10/10 Aggravating factors: standing, steps, inclines, walking Relieving factors: rest Pain description: aching Stage: Chronic 24 hour pattern: NA   Occupation: process medical records, sitting for long periods  Assistive Device: NA  Hand Dominance: NA  Patient Goals/Specific Activities: reduce pain, stand without pain   OBJECTIVE:   DIAGNOSTIC FINDINGS:  None recent  GENERAL OBSERVATION/GAIT: Slow antalgic gait, reduced time in stance on R, WBOS  SENSATION: Light touch: appears intact  PALPATION: Significant TTP R hip   LE MMT:  MMT Right (Eval) Left (Eval)  Hip flexion (L2, L3) 4 4  Knee extension (L3) 4+ 3+*  Knee flexion 4 4  Hip abduction 4 NT  Hip extension    Hip external rotation    Hip internal rotation    Hip adduction    Ankle dorsiflexion (L4)    Ankle plantarflexion (S1)    Ankle inversion    Ankle eversion    Great Toe ext (L5)    Grossly     (Blank rows = not tested, score listed is out of 5 possible points.  N = WNL, D = diminished, C = clear for gross weakness with myotome testing, * = concordant pain with testing)  LE ROM:  ROM Right (Eval) Left (Eval)  Hip flexion    Hip extension    Hip abduction    Hip adduction    Hip internal rotation    Hip external rotation    Knee extension    Knee  flexion    Ankle dorsiflexion    Ankle plantarflexion    Ankle inversion    Ankle eversion     (Blank rows = not tested, N = WNL, * = concordant pain with testing)  Functional Tests  Eval  PATIENT SURVEYS:  LEFS: 24/80   TODAY'S TREATMENT: TREATMENT 01/15/2024:  Aquatic therapy at [facility]- [address] - therapeutic pool temp 92 degrees Pt enters building independently.  Treatment took place in water 3.8 to  4 ft 8 in.feet deep depending upon activity.  Pt entered and exited the pool via stair and handrails    Aquatic Therapy:  Water walking for warm up fwd/lat/bkwds  Squats - 2x15 Heel raises - 2x15 HS curl Hip ext Hip flexor stretch HS stretch Cross over walking - challenging L LE > R Standing hip abd circles - x15 ea Standing hip flexion ext knee straight - 2x15 Yellow noodle HS stretch Lateral walking with resistance at ankles Step up fwd 10x ea Waterbell narrow BOS shoulde ext and punches  Pt requires the buoyancy of water for active assisted exercises with buoyancy supported for strengthening and AROM exercises. Hydrostatic pressure also supports joints by unweighting joint load by at least 50 % in 3-4 feet depth water. 80% in chest to neck deep water. Water will provide assistance with movement using the current and laminar flow while the buoyancy reduces weight bearing. Pt requires the viscosity of the water for resistance with strengthening exercises.  HOME EXERCISE PROGRAM: None provided today  Treatment priorities   Eval                                                  ASSESSMENT:  CLINICAL IMPRESSION:  01/15/2024: Session today focused on hip and core in the aquatic environment for use of buoyancy to offload joints and the viscosity of water as resistance during therapeutic exercise.  Pt continues to make good progress toward goals with reduced pain which is now max  4-5/10 with activity.  She shows reduced pain and improved tolerance to exercise generally.  She plans on getting a pool membership at the [facility].  Patient was able to tolerate all prescribed exercises in the aquatic environment with no adverse effects and reports 3/10 pain at the end of the session. Patient continues to benefit from skilled PT services on land and aquatic based and should be progressed as able to improve functional independence.   EVAL: Malya is a 43 y.o. female who presents to clinic with signs and sxs consistent with R hip pain.  Consistent with GTPS with some element of ITBS.  She is exquisitely tender over GT as well as glute med and TFL.  Given pts high pain and BMI I recommend a round of aquatic therapy to allow more comfortable exercise in a gravity reduced environment.  Pt will benefit from skilled PT to address relevant deficits and improve comfort with daily tasks such as step navigation into her apartment and other functional mobility.      OBJECTIVE IMPAIRMENTS: Pain, hip and LE strength, gait  ACTIVITY LIMITATIONS: standing, walking, steps, squatting, sleeping  PERSONAL FACTORS: See medical history and pertinent history   REHAB POTENTIAL: Good  CLINICAL DECISION MAKING: Evolving/moderate complexity  EVALUATION COMPLEXITY: Moderate   GOALS:   SHORT TERM GOALS: Target date: 12/15/2023   Gaila will be >75% HEP compliant to improve carryover between sessions and facilitate independent management of condition  Evaluation: ongoing Goal status: MET   LONG TERM GOALS: Target date: 01/12/2024 extended to 03/04/2024    Itzelle will self report >/= 50% decrease in pain from evaluation to improve  function in daily tasks  Evaluation/Baseline: 10/10 max pain 3/27: some improvement in pain, but there are still days where it is quite limiting Goal status: ongoing   2.  Sharlyne will be able to navigate 10 x3 steps, not limited by pain, to improve ability to navigate  steps going into her apartment  Evaluation/Baseline: unable 3/28: able to navigate, but with pain and difficulty Goal status: ongoing   3.  Paiten will show a >/= 18 pt improvement in LEFS score (MCID is ~11% or 9 pts) as a proxy for functional improvement   Evaluation/Baseline: 24/80 pts Goal status: INITIAL   4.  Keiarra will report confidence in self management of condition at time of discharge with advanced HEP  Evaluation/Baseline: unable to self manage 3/28: ongoing Goal status: onging   PLAN: PT FREQUENCY: 1-2x/week  PT DURATION: 8 weeks  PLANNED INTERVENTIONS:  [id number]- PT Re-evaluation, 97110-Therapeutic exercises, 97530- Therapeutic activity, 97112- Neuromuscular re-education, 97535- Self Care, 16109- Manual therapy, [id number]- Gait training, [id number]- Aquatic Therapy, [phone]- Electrical stimulation (manual), [id number]- Vasopneumatic device, [id number]- Traction (mechanical), [id number]- Ionotophoresis 4mg /ml Dexamethasone, Taping, Dry Needling, Joint manipulation, and Spinal manipulation.   Alphonzo Severance PT, DPT 01/15/2024, 8:18 AM
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
## 2024-02-11 ENCOUNTER — Encounter (HOSPITAL_COMMUNITY): Payer: Self-pay | Admitting: Psychiatry

## 2024-02-11 ENCOUNTER — Ambulatory Visit (HOSPITAL_COMMUNITY): Payer: Self-pay | Admitting: Psychiatry

## 2024-02-11 DIAGNOSIS — F4310 Post-traumatic stress disorder, unspecified: Secondary | ICD-10-CM

## 2024-02-11 DIAGNOSIS — F410 Panic disorder [episodic paroxysmal anxiety] without agoraphobia: Secondary | ICD-10-CM

## 2024-02-11 DIAGNOSIS — F411 Generalized anxiety disorder: Secondary | ICD-10-CM

## 2024-02-18 ENCOUNTER — Encounter: Payer: Self-pay | Admitting: Physical Therapy

## 2024-02-18 ENCOUNTER — Ambulatory Visit: Payer: PRIVATE HEALTH INSURANCE | Attending: Sports Medicine | Admitting: Physical Therapy

## 2024-02-18 DIAGNOSIS — M25551 Pain in right hip: Secondary | ICD-10-CM | POA: Diagnosis present

## 2024-02-18 DIAGNOSIS — M5459 Other low back pain: Secondary | ICD-10-CM | POA: Insufficient documentation

## 2024-02-18 DIAGNOSIS — M6281 Muscle weakness (generalized): Secondary | ICD-10-CM | POA: Insufficient documentation

## 2024-02-18 DIAGNOSIS — R2689 Other abnormalities of gait and mobility: Secondary | ICD-10-CM | POA: Insufficient documentation

## 2024-02-18 NOTE — Therapy (Signed)
 PHYSICAL THERAPY DISCHARGE SUMMARY  Visits from Start of Care: 7  Current functional level related to goals / functional outcomes: See assessment/goals   Remaining deficits: See assessment/goals   Education / Equipment: HEP and D/C plans  Patient agrees to discharge. Patient goals were met. Patient is being discharged due to meeting the stated rehab goals.  Patient Name: Catherine [name] MRN: [id number] DOB:1980/11/24, 43 y.o., female Today's Date: 02/19/2024   PT End of Session - 02/18/24 1616     Visit Number 7    Number of Visits --   1-2x/week   Date for PT Re-Evaluation 03/04/24    Authorization Type [employer]    Authorization - Number of Visits 30    PT Start Time 0415    PT Stop Time 0456    PT Time Calculation (min) 41 min                  Past Medical History:  Diagnosis Date   Allergy    Anxiety    Arthritis    Asthma    since baby, seasonal   ASTHMA, UNSPECIFIED 12/10/2006   Qualifier: Diagnosis of  By: Gae Jointer     CELLULITIS AND ABSCESS OF LEG EXCEPT FOOT 12/30/2007   Qualifier: Diagnosis of  By: Gaylyn Keas FNP, [place]     Cyst of bone    right side of spine   DEPENDENT EDEMA, LEGS, BILATERAL 11/22/2008   Qualifier: Diagnosis of  By: Katheleen Palmer MD, [place]     Depression 2003Dx   Edema    Food allergy    citris   FOOT PAIN, RIGHT 11/22/2008   Qualifier: Diagnosis of  By: Katheleen Palmer MD, [place]     Gastric ulcer    GERD (gastroesophageal reflux disease)    Gout    Hypertension    took Norvasc  for 3 months, doctor discontinued no longer on BP medications   Joint pain    KNEE INJURY, LEFT 01/01/2009   Qualifier: Diagnosis of  By: Jayne Mews MD, [place]     Migraine    ONYCHOMYCOSIS, TOENAILS 11/22/2008   Qualifier: Diagnosis of  By: Katheleen Palmer MD, [place]     Shortness of breath    Sleep apnea    TINEA PEDIS 11/22/2008   Qualifier: Diagnosis of  By: Katheleen Palmer MD, Lisa Rideau     Past Surgical History:  Procedure Laterality Date    DILITATION & CURRETTAGE/HYSTROSCOPY WITH NOVASURE ABLATION N/A 07/03/2017   Procedure: DILATATION & CURETTAGE/HYSTEROSCOPY WITH NOVASURE ABLATION;  Surgeon: Lavoie, Marie-Lyne, MD;  Location: [facility];  Service: Gynecology;  Laterality: N/A;   UPPER GI ENDOSCOPY     WISDOM TOOTH EXTRACTION  2008   x4   Patient Active Problem List   Diagnosis Date Noted   Mood disorder (HCC) 12/08/2023   Left foot pain 08/22/2023   Intertrigo 05/07/2023   Carpal tunnel syndrome of right wrist 09/15/2022   Knee pain, right 07/25/2022   Thoracic back pain 04/08/2022   SOBOE (shortness of breath on exertion) 03/10/2022   At risk for activity intolerance 03/10/2022   Skin lesion 02/28/2022   Genetic testing 02/26/2022   At high risk for breast cancer 02/26/2022   PCOS (polycystic ovarian syndrome) 02/07/2022   Abnormal mammogram 02/07/2022   Family history of breast cancer 02/07/2022   Family history of pancreatic cancer 02/07/2022   Family history of prostate cancer 02/07/2022   Chronic pain syndrome 07/26/2021   Polyphagia 01/23/2021   Bursitis 01/01/2021   Pain in both lower extremities  11/16/2020   Peroneal neuropathy 11/16/2020   Class 3 severe obesity with serious comorbidity and body mass index (BMI) greater than or equal to 70 in adult 06/10/2017   Fluid retention 06/10/2017   Menorrhagia 05/18/2017   Sacral radiculopathy 02/04/2017   Insulin  resistance 02/02/2017   At risk for violence to self related to depression 02/02/2017   Vitamin D  deficiency 11/12/2016   Asthma 07/25/2016   Gout 11/21/2015   Depression 12/10/2006   Migraine headache 12/10/2006   GASTROESOPHAGEAL REFLUX, NO ESOPHAGITIS 12/10/2006   OSA (obstructive sleep apnea) 12/10/2006    PCP: Wilhemena Harbour, MD  REFERRING PROVIDER: Marliss Simple, MD  THERAPY DIAG:  Right hip pain  Other abnormalities of gait and mobility  Muscle weakness  Other low back pain  REFERRING DIAG: Right hip pain [M25.551], Greater  trochanteric pain syndrome of right lower extremity [M25.551]   Rationale for Evaluation and Treatment:  Rehabilitation  SUBJECTIVE:  PERTINENT PAST HISTORY:  Bipolar, depression, gout, radiculopathy, obesity, chronic pain syndrome, SOBOE        PRECAUTIONS: None  WEIGHT BEARING RESTRICTIONS No  FALLS:  Has patient fallen in last 6 months? No, Number of falls: 0  MOI/History of condition:  Onset date: 2022  SUBJECTIVE STATEMENT  02/19/2024: Pt reports that her hip has been doing relatively well and she has been walking more and exercising at home.  EVAL: Catherine [name] is a 43 y.o. female who presents to clinic with chief complaint of R hip pain.  This is a chronic issue ongoing since 2022.  She was diagnosed with bursitis.  She notes that it is worse with weather changes.  She received an injection in the R hip about 3 weeks ago which was somewhat helpful.    Red flags:  denies   Pain:  Are you having pain? Yes Pain location: R lateral hip to mid lateral thigh  NPRS scale:  1/10 to 10/10 Aggravating factors: standing, steps, inclines, walking Relieving factors: rest Pain description: aching Stage: Chronic 24 hour pattern: NA   Occupation: process medical records, sitting for long periods  Assistive Device: NA  Hand Dominance: NA  Patient Goals/Specific Activities: reduce pain, stand without pain   OBJECTIVE:   DIAGNOSTIC FINDINGS:  None recent  GENERAL OBSERVATION/GAIT: Slow antalgic gait, reduced time in stance on R, WBOS  SENSATION: Light touch: appears intact  PALPATION: Significant TTP R hip   LE MMT:  MMT Right (Eval) Left (Eval)  Hip flexion (L2, L3) 4 4  Knee extension (L3) 4+ 3+*  Knee flexion 4 4  Hip abduction 4 NT  Hip extension    Hip external rotation    Hip internal rotation    Hip adduction    Ankle dorsiflexion (L4)    Ankle plantarflexion (S1)    Ankle inversion    Ankle eversion    Great Toe ext (L5)    Grossly      (Blank rows = not tested, score listed is out of 5 possible points.  N = WNL, D = diminished, C = clear for gross weakness with myotome testing, * = concordant pain with testing)  LE ROM:  ROM Right (Eval) Left (Eval)  Hip flexion    Hip extension    Hip abduction    Hip adduction    Hip internal rotation    Hip external rotation    Knee extension    Knee flexion    Ankle dorsiflexion    Ankle plantarflexion  Ankle inversion    Ankle eversion     (Blank rows = not tested, N = WNL, * = concordant pain with testing)  Functional Tests  Eval                                                              PATIENT SURVEYS:  LEFS: 24/80   TODAY'S TREATMENT: TREATMENT 02/19/2024:  Aquatic therapy at [facility]- [address] - therapeutic pool temp 92 degrees Pt enters building independently.  Treatment took place in water 3.8 to  4 ft 8 in.feet deep depending upon activity.  Pt entered and exited the pool via stair and handrails    Aquatic Therapy:  Water walking for warm up fwd/lat/bkwds  Squats - 2x15 Heel raises - 2x15 Hip flexor stretch HS stretch Cross over walking - challenging L > R Standing hip abd circles - x15 ea Standing hip flexion ext knee straight - 2x15 Reverse lunge with YDB Yellow noodle HS stretch Lateral walking with resistance at ankles Step up fwd and lat 10x ea  Pt requires the buoyancy of water for active assisted exercises with buoyancy supported for strengthening and AROM exercises. Hydrostatic pressure also supports joints by unweighting joint load by at least 50 % in 3-4 feet depth water. 80% in chest to neck deep water. Water will provide assistance with movement using the current and laminar flow while the buoyancy reduces weight bearing. Pt requires the viscosity of the water for resistance with strengthening exercises.  HOME EXERCISE PROGRAM: None provided today  Treatment priorities   Eval                                                   ASSESSMENT:  CLINICAL IMPRESSION:  02/19/2024: Pt did well today after extended break from PT.  She has been exercising on her own, losing weight, and feeling better overall.  She has met all goals set on eval and feels confident with D/C today.   EVAL: Ayisha is a 43 y.o. female who presents to clinic with signs and sxs consistent with R hip pain.  Consistent with GTPS with some element of ITBS.  She is exquisitely tender over GT as well as glute med and TFL.  Given pts high pain and BMI I recommend a round of aquatic therapy to allow more comfortable exercise in a gravity reduced environment.  Pt will benefit from skilled PT to address relevant deficits and improve comfort with daily tasks such as step navigation into her apartment and other functional mobility.      OBJECTIVE IMPAIRMENTS: Pain, hip and LE strength, gait  ACTIVITY LIMITATIONS: standing, walking, steps, squatting, sleeping  PERSONAL FACTORS: See medical history and pertinent history   REHAB POTENTIAL: Good  CLINICAL DECISION MAKING: Evolving/moderate complexity  EVALUATION COMPLEXITY: Moderate   GOALS:   SHORT TERM GOALS: Target date: 12/15/2023   Katy will be >75% HEP compliant to improve carryover between sessions and facilitate independent management of condition  Evaluation: ongoing Goal status: MET   LONG TERM GOALS: Target date: 01/12/2024 extended to 03/04/2024    Prestyn will self report >/= 50% decrease in pain  from evaluation to improve function in daily tasks  Evaluation/Baseline: 10/10 max pain 3/27: some improvement in pain, but there are still days where it is quite limiting 5/8: 50% Goal status: MET   2.  Annesha will be able to navigate 10 x3 steps, not limited by pain, to improve ability to navigate steps going into her apartment  Evaluation/Baseline: unable 3/28: able to navigate, but with pain and difficulty 5/8: has not tried, but feels capable Goal  status: MET   3.  Jezelle will show a >/= 18 pt improvement in LEFS score (MCID is ~11% or 9 pts) as a proxy for functional improvement   Evaluation/Baseline: 24/80 pts 5/8: Lower Extremity Functional Score: 44 / 80 = 55.0 % Goal status: MET   4.  Zelia will report confidence in self management of condition at time of discharge with advanced HEP  Evaluation/Baseline: unable to self manage 3/28: ongoing 5/8: MET Goal status: MET   PLAN: PT FREQUENCY: 1-2x/week  PT DURATION: 8 weeks  PLANNED INTERVENTIONS:  97164- PT Re-evaluation, 97110-Therapeutic exercises, 97530- Therapeutic activity, 97112- Neuromuscular re-education, 97535- Self Care, 97140- Manual therapy, [id number]- Gait training, [id number]- Aquatic Therapy, [id number]- Electrical stimulation (manual), [id number]- Vasopneumatic device, [id number]- Traction (mechanical), [id number]- Ionotophoresis 4mg /ml Dexamethasone , Taping, Dry Needling, Joint manipulation, and Spinal manipulation.   Regina Coppolino PT, DPT 02/19/2024, 7:26 AM

## 2024-02-19 ENCOUNTER — Other Ambulatory Visit: Payer: Self-pay | Admitting: Primary Care

## 2024-02-19 ENCOUNTER — Other Ambulatory Visit: Payer: Self-pay | Admitting: Student

## 2024-02-19 DIAGNOSIS — R609 Edema, unspecified: Secondary | ICD-10-CM

## 2024-02-25 ENCOUNTER — Encounter: Payer: Self-pay | Admitting: Student

## 2024-02-25 ENCOUNTER — Ambulatory Visit: Payer: PRIVATE HEALTH INSURANCE | Admitting: Physical Therapy

## 2024-02-27 ENCOUNTER — Other Ambulatory Visit: Payer: Self-pay | Admitting: Student

## 2024-02-27 DIAGNOSIS — F39 Unspecified mood [affective] disorder: Secondary | ICD-10-CM

## 2024-02-27 NOTE — Progress Notes (Signed)
 Patient Behavioral health specialist recommending increasing Gabapentin  to 300 BID, and patient okay to f/u w/ PCP to manage anxiety Sx.  This provider okay to increase gabapentin  dosing pending new blood work to check renal fxn.

## 2024-03-03 ENCOUNTER — Other Ambulatory Visit: Payer: Self-pay | Admitting: Student

## 2024-03-03 DIAGNOSIS — F39 Unspecified mood [affective] disorder: Secondary | ICD-10-CM

## 2024-03-07 ENCOUNTER — Telehealth: Payer: PRIVATE HEALTH INSURANCE | Admitting: Physician Assistant

## 2024-03-07 DIAGNOSIS — L304 Erythema intertrigo: Secondary | ICD-10-CM

## 2024-03-07 MED ORDER — NYSTATIN 100000 UNIT/GM EX CREA: 1.0000 | TOPICAL_CREAM | Freq: Two times a day (BID) | CUTANEOUS | 0 refills | Status: AC

## 2024-03-07 NOTE — Progress Notes (Signed)
E Visit for Rash  We are sorry that you are not feeling well. Here is how we plan to help!  Based upon your presentation it appears you have a fungal infection.  I have prescribed: and Nystatin cream apply to the affected area twice daily   HOME CARE:  Take cool showers and avoid direct sunlight. Apply cool compress or wet dressings. Take a bath in an oatmeal bath.  Sprinkle content of one Aveeno packet under running faucet with comfortably warm water.  Bathe for 15-20 minutes, 1-2 times daily.  Pat dry with a towel. Do not rub the rash. Use hydrocortisone cream. Take an antihistamine like Benadryl for widespread rashes that itch.  The adult dose of Benadryl is 25-50 mg by mouth 4 times daily. Caution:  This type of medication may cause sleepiness.  Do not drink alcohol, drive, or operate dangerous machinery while taking antihistamines.  Do not take these medications if you have prostate enlargement.  Read package instructions thoroughly on all medications that you take.  GET HELP RIGHT AWAY IF:  Symptoms don't go away after treatment. Severe itching that persists. If you rash spreads or swells. If you rash begins to smell. If it blisters and opens or develops a yellow-brown crust. You develop a fever. You have a sore throat. You become short of breath.  MAKE SURE YOU:  Understand these instructions. Will watch your condition. Will get help right away if you are not doing well or get worse.  Thank you for choosing an e-visit.  Your e-visit answers were reviewed by a board certified advanced clinical practitioner to complete your personal care plan. Depending upon the condition, your plan could have included both over the counter or prescription medications.  Please review your pharmacy choice. Make sure the pharmacy is open so you can pick up prescription now. If there is a problem, you may contact your provider through MyChart messaging and have the prescription routed to  another pharmacy.  Your safety is important to us. If you have drug allergies check your prescription carefully.   For the next 24 hours you can use MyChart to ask questions about today's visit, request a non-urgent call back, or ask for a work or school excuse. You will get an email in the next two days asking about your experience. I hope that your e-visit has been valuable and will speed your recovery.  I have spent 5 minutes in review of e-visit questionnaire, review and updating patient chart, medical decision making and response to patient.   Sherrol Vicars M Tamarick Kovalcik, PA-C  

## 2024-03-10 ENCOUNTER — Other Ambulatory Visit: Payer: Self-pay | Admitting: Student

## 2024-03-10 DIAGNOSIS — M79604 Pain in right leg: Secondary | ICD-10-CM

## 2024-03-11 MED ORDER — GABAPENTIN 300 MG PO CAPS
300.0000 mg | ORAL_CAPSULE | Freq: Two times a day (BID) | ORAL | 3 refills | Status: DC
Start: 2024-03-11 — End: 2024-07-22

## 2024-03-11 NOTE — Progress Notes (Signed)
 Pt request change in Rx that was suggested by Psych provider for management of her anxiety. Psych provider sent staff message about adjusting patient gabapentin  300 mg to BID.   I agreed with plan.   Pt sent in photo of recent blood work showing Cr .96, and okay to increase dosage of gabapentin .   Rx sent to [facility]

## 2024-03-22 ENCOUNTER — Encounter: Payer: Self-pay | Admitting: *Deleted

## 2024-04-12 ENCOUNTER — Other Ambulatory Visit: Payer: Self-pay | Admitting: Student

## 2024-04-12 DIAGNOSIS — F39 Unspecified mood [affective] disorder: Secondary | ICD-10-CM

## 2024-06-04 ENCOUNTER — Telehealth: Payer: PRIVATE HEALTH INSURANCE | Admitting: Family Medicine

## 2024-06-04 DIAGNOSIS — J4520 Mild intermittent asthma, uncomplicated: Secondary | ICD-10-CM

## 2024-06-04 MED ORDER — ALBUTEROL SULFATE HFA 108 (90 BASE) MCG/ACT IN AERS: 2.0000 | INHALATION_SPRAY | Freq: Four times a day (QID) | RESPIRATORY_TRACT | 0 refills | Status: AC | PRN

## 2024-06-04 NOTE — Progress Notes (Signed)
 Virtual Visit Consent   Catherine [name], you are scheduled for a virtual visit with a [facility] provider today. Just as with appointments in the office, your consent must be obtained to participate. Your consent will be active for this visit and any virtual visit you may have with one of our providers in the next 365 days. If you have a MyChart account, a copy of this consent can be sent to you electronically.  As this is a virtual visit, video technology does not allow for your provider to perform a traditional examination. This may limit your provider's ability to fully assess your condition. If your provider identifies any concerns that need to be evaluated in person or the need to arrange testing (such as labs, EKG, etc.), we will make arrangements to do so. Although advances in technology are sophisticated, we cannot ensure that it will always work on either your end or our end. If the connection with a video visit is poor, the visit may have to be switched to a telephone visit. With either a video or telephone visit, we are not always able to ensure that we have a secure connection.  By engaging in this virtual visit, you consent to the provision of healthcare and authorize for your insurance to be billed (if applicable) for the services provided during this visit. Depending on your insurance coverage, you may receive a charge related to this service.  I need to obtain your verbal consent now. Are you willing to proceed with your visit today? [name] has provided verbal consent on 06/04/2024 for a virtual visit (video or telephone). Loa Lamp, FNP  Date: 06/04/2024 2:37 PM   Virtual Visit via Video Note   I, Loa Lamp, connected with  [name]  ([id number], 01-Aug-1981) on 06/04/24 at  2:30 PM EDT by a video-enabled telemedicine application and verified that I am speaking with the correct person using two identifiers.  Location: Patient: Virtual Visit Location Patient:  Home Provider: Virtual Visit Location Provider: Home Office   I discussed the limitations of evaluation and management by telemedicine and the availability of in person appointments. The patient expressed understanding and agreed to proceed.    History of Present Illness: Catherine [name] is a 43 y.o. who identifies as a female who was assigned female at birth, and is being seen today for asthma flare with mild wheezing. In no distress. Requests refill on albuterol  inhaler. SABRA  HPI: HPI  Problems:  Patient Active Problem List   Diagnosis Date Noted   Mood disorder (HCC) 12/08/2023   Left foot pain 08/22/2023   Intertrigo 05/07/2023   Carpal tunnel syndrome of right wrist 09/15/2022   Knee pain, right 07/25/2022   Thoracic back pain 04/08/2022   SOBOE (shortness of breath on exertion) 03/10/2022   At risk for activity intolerance 03/10/2022   Skin lesion 02/28/2022   Genetic testing 02/26/2022   At high risk for breast cancer 02/26/2022   PCOS (polycystic ovarian syndrome) 02/07/2022   Abnormal mammogram 02/07/2022   Family history of breast cancer 02/07/2022   Family history of pancreatic cancer 02/07/2022   Family history of prostate cancer 02/07/2022   Chronic pain syndrome 07/26/2021   Polyphagia 01/23/2021   Bursitis 01/01/2021   Pain in both lower extremities 11/16/2020   Peroneal neuropathy 11/16/2020   Class 3 severe obesity with serious comorbidity and body mass index (BMI) greater than or equal to 70 in adult 06/10/2017   Fluid retention 06/10/2017  Menorrhagia 05/18/2017   Sacral radiculopathy 02/04/2017   Insulin  resistance 02/02/2017   At risk for violence to self related to depression 02/02/2017   Vitamin D  deficiency 11/12/2016   Asthma 07/25/2016   Gout 11/21/2015   Depression 12/10/2006   Migraine headache 12/10/2006   GASTROESOPHAGEAL REFLUX, NO ESOPHAGITIS 12/10/2006   OSA (obstructive sleep apnea) 12/10/2006    Allergies:  Allergies  Allergen  Reactions   Citrus Swelling   Medications:  Current Outpatient Medications:    albuterol  (VENTOLIN  HFA) 108 (90 Base) MCG/ACT inhaler, Inhale 2 puffs into the lungs every 6 (six) hours as needed for wheezing or shortness of breath., Disp: 8 g, Rfl: 0   albuterol  (VENTOLIN  HFA) 108 (90 Base) MCG/ACT inhaler, Inhale 2 puffs into the lungs every 6 (six) hours as needed for wheezing or shortness of breath., Disp: 8 g, Rfl: 0   AMBULATORY NON FORMULARY MEDICATION, Compression shorts, Disp: 1 Units, Rfl: 0   celecoxib  (CELEBREX ) 200 MG capsule, TAKE 1 CAPSULE (200 MG TOTAL) BY MOUTH DAILY. SCHEDULE VISIT BEFORE NEXT FILL, Disp: 30 capsule, Rfl: 0   cetirizine  (ZYRTEC ) 10 MG tablet, Take 10 mg by mouth daily., Disp: , Rfl:    famotidine  (PEPCID ) 20 MG tablet, TAKE 1 TABLET BY MOUTH EVERYDAY AT BEDTIME, Disp: 90 tablet, Rfl: 0   fluticasone  (FLONASE ) 50 MCG/ACT nasal spray, Place 2 sprays into both nostrils daily., Disp: 16 g, Rfl: 0   furosemide  (LASIX ) 20 MG tablet, TAKE 1 TABLET BY MOUTH TWICE A DAY, Disp: 180 tablet, Rfl: 2   gabapentin  (NEURONTIN ) 300 MG capsule, Take 1 capsule (300 mg total) by mouth 2 (two) times daily., Disp: 60 capsule, Rfl: 3   hydrOXYzine  (ATARAX ) 10 MG tablet, TAKE 1 TABLET BY MOUTH 3 TIMES DAILY AS NEEDED FOR ANXIETY., Disp: 30 tablet, Rfl: 3   metFORMIN  (GLUCOPHAGE -XR) 500 MG 24 hr tablet, TAKE 1 TABLET BY MOUTH EVERY DAY WITH BREAKFAST (Patient not taking: Reported on 12/07/2023), Disp: 90 tablet, Rfl: 1   nystatin  cream (MYCOSTATIN ), Apply 1 Application topically 2 (two) times daily., Disp: 30 g, Rfl: 0   pantoprazole  (PROTONIX ) 40 MG tablet, TAKE 1 TABLET (40 MG TOTAL) BY MOUTH DAILY. TAKE 30 MINUTES PRIOR TO BREAKFAST, Disp: 90 tablet, Rfl: 1   QUEtiapine  (SEROQUEL ) 50 MG tablet, TAKE 1 TABLET BY MOUTH EVERYDAY AT BEDTIME, Disp: 90 tablet, Rfl: 0   Semaglutide ,0.25 or 0.5MG /DOS, (OZEMPIC , 0.25 OR 0.5 MG/DOSE,) 2 MG/1.5ML SOPN, Inject 0.5 mg into the skin once a week.,  Disp: , Rfl:    testosterone cypionate (DEPOTESTOSTERONE CYPIONATE) 200 MG/ML injection, Inject 200 mg into the muscle every 7 (seven) days., Disp: , Rfl:    tiZANidine  (ZANAFLEX ) 4 MG tablet, TAKE 1 TABLET (4 MG TOTAL) BY MOUTH EVERY 8 (EIGHT) HOURS AS NEEDED FOR MUSCLE SPASMS, Disp: 30 tablet, Rfl: 0  Observations/Objective: Patient is well-developed, well-nourished in no acute distress.  Resting comfortably  at home.  Head is normocephalic, atraumatic.  No labored breathing.  Speech is clear and coherent with logical content.  Patient is alert and oriented at baseline.    Assessment and Plan: 1. Intermittent asthma without complication, unspecified asthma severity (Primary)  UC if sx worsen.   Follow Up Instructions: I discussed the assessment and treatment plan with the patient. The patient was provided an opportunity to ask questions and all were answered. The patient agreed with the plan and demonstrated an understanding of the instructions.  A copy of instructions were sent to the patient via  MyChart unless otherwise noted below.     The patient was advised to call back or seek an in-person evaluation if the symptoms worsen or if the condition fails to improve as anticipated.    Tiann Saha, FNP

## 2024-06-04 NOTE — Patient Instructions (Signed)
 Asthma, Adult  Asthma is a long-term (chronic) condition that causes recurrent episodes in which the lower airways in the lungs become tight and narrow. The narrowing is caused by inflammation and tightening of the smooth muscle around the lower airways. Asthma episodes, also called asthma attacks or asthma flares, may cause coughing, making high-pitched whistling sounds when you breathe, most often when you breathe out (wheezing), shortness of breath, and chest pain. The airways may produce extra mucus caused by the inflammation and irritation. During an attack, it can be difficult to breathe. Asthma attacks can range from minor to life-threatening. Asthma cannot be cured, but medicines and lifestyle changes can help control it and treat acute attacks. It is important to keep your asthma well controlled so the condition does not interfere with your daily life. What are the causes? This condition is believed to be caused by inherited (genetic) and environmental factors, but its exact cause is not known. What can trigger an asthma attack? Many things can bring on an asthma attack or make symptoms worse. These triggers are different for every person. Common triggers include: Allergens and irritants like mold, dust, pet dander, cockroaches, pollen, air pollution, and chemical odors. Cigarette smoke. Weather changes and cold air. Stress and strong emotional responses such as crying or laughing hard. Certain medications such as aspirin or beta blockers. Infections and inflammatory conditions, such as the flu, a cold, pneumonia, or inflammation of the nasal membranes (rhinitis). Gastroesophageal reflux disease (GERD). What are the signs or symptoms? Symptoms may occur right after exposure to an asthma trigger or hours later and can vary by person. Common signs and symptoms include: Wheezing. Trouble breathing (shortness of breath). Excessive nighttime or early morning coughing. Chest  tightness. Tiredness (fatigue) with minimal activity. Difficulty talking in complete sentences. Poor exercise tolerance. How is this diagnosed? This condition is diagnosed based on: A physical exam and your medical history. Tests, which may include: Lung function studies to evaluate the flow of air in your lungs. Allergy tests. Imaging tests, such as X-rays. How is this treated? There is no cure, but symptoms can be controlled with proper treatment. Treatment usually involves: Identifying and avoiding your asthma triggers. Inhaled medicines. Two types are commonly used to treat asthma, depending on severity: Controller medicines. These help prevent asthma symptoms from occurring. They are taken every day. Fast-acting reliever or rescue medicines. These quickly relieve asthma symptoms. They are used as needed and provide short-term relief. Using other medicines, such as: Allergy medicines, such as antihistamines, if your asthma attacks are triggered by allergens. Immune medicines (immunomodulators). These are medicines that help control the immune system. Using supplemental oxygen. This is only needed during a severe episode. Creating an asthma action plan. An asthma action plan is a written plan for managing and treating your asthma attacks. This plan includes: A list of your asthma triggers and how to avoid them. Information about when medicines should be taken and when their dosage should be changed. Instructions about using a device called a peak flow meter. A peak flow meter measures how well the lungs are working and the severity of your asthma. It helps you monitor your condition. Follow these instructions at home: Take over-the-counter and prescription medicines only as told by your health care provider. Stay up to date on all vaccinations as recommended by your healthcare provider, including vaccines for the flu and pneumonia. Use a peak flow meter and keep track of your peak flow  readings. Understand and use your asthma  action plan to address any asthma flares. Do not smoke or allow anyone to smoke in your home. Contact a health care provider if: You have wheezing, shortness of breath, or a cough that is not responding to medicines. Your medicines are causing side effects, such as a rash, itching, swelling, or trouble breathing. You need to use a reliever medicine more than 2-3 times a week. Your peak flow reading is still at 50-79% of your personal best after following your action plan for 1 hour. You have a fever and shortness of breath. Get help right away if: You are getting worse and do not respond to treatment during an asthma attack. You are short of breath when at rest or when doing very little physical activity. You have difficulty eating, drinking, or talking. You have chest pain or tightness. You develop a fast heartbeat or palpitations. You have a bluish color to your lips or fingernails. You are light-headed or dizzy, or you faint. Your peak flow reading is less than 50% of your personal best. You feel too tired to breathe normally. These symptoms may be an emergency. Get help right away. Call 911. Do not wait to see if the symptoms will go away. Do not drive yourself to the hospital. Summary Asthma is a long-term (chronic) condition that causes recurrent episodes in which the airways become tight and narrow. Asthma episodes, also called asthma attacks or asthma flares, can cause coughing, wheezing, shortness of breath, and chest pain. Asthma cannot be cured, but medicines and lifestyle changes can help keep it well controlled and prevent asthma flares. Make sure you understand how to avoid triggers and how and when to use your medicines. Asthma attacks can range from minor to life-threatening. Get help right away if you have an asthma attack and do not respond to treatment with your usual rescue medicines. This information is not intended to replace  advice given to you by your health care provider. Make sure you discuss any questions you have with your health care provider. Document Revised: 07/17/2021 Document Reviewed: 07/08/2021 Elsevier Patient Education  2024 [employer].

## 2024-07-22 ENCOUNTER — Other Ambulatory Visit: Payer: Self-pay

## 2024-07-22 DIAGNOSIS — M79604 Pain in right leg: Secondary | ICD-10-CM

## 2024-07-22 MED ORDER — GABAPENTIN 300 MG PO CAPS: 300.0000 mg | ORAL_CAPSULE | Freq: Two times a day (BID) | ORAL | 1 refills | Status: DC

## 2024-07-27 ENCOUNTER — Telehealth: Payer: Self-pay

## 2024-07-27 NOTE — Telephone Encounter (Signed)
 LVM for patient to schedule f/u with PCP to continue getting med refills on gabapentin .

## 2024-08-08 ENCOUNTER — Ambulatory Visit (INDEPENDENT_AMBULATORY_CARE_PROVIDER_SITE_OTHER): Payer: PRIVATE HEALTH INSURANCE | Admitting: Family Medicine

## 2024-08-08 VITALS — BP 140/103 | HR 84 | Ht 67.0 in | Wt >= 6400 oz

## 2024-08-08 DIAGNOSIS — M25561 Pain in right knee: Secondary | ICD-10-CM

## 2024-08-08 DIAGNOSIS — F39 Unspecified mood [affective] disorder: Secondary | ICD-10-CM

## 2024-08-08 DIAGNOSIS — M79604 Pain in right leg: Secondary | ICD-10-CM | POA: Diagnosis not present

## 2024-08-08 DIAGNOSIS — G8929 Other chronic pain: Secondary | ICD-10-CM

## 2024-08-08 DIAGNOSIS — M79605 Pain in left leg: Secondary | ICD-10-CM

## 2024-08-08 DIAGNOSIS — M25562 Pain in left knee: Secondary | ICD-10-CM

## 2024-08-08 DIAGNOSIS — J069 Acute upper respiratory infection, unspecified: Secondary | ICD-10-CM | POA: Diagnosis not present

## 2024-08-08 MED ORDER — GABAPENTIN 300 MG PO CAPS: 300.0000 mg | ORAL_CAPSULE | Freq: Two times a day (BID) | ORAL | 1 refills | Status: DC

## 2024-08-08 NOTE — Patient Instructions (Signed)
 It was wonderful to see you today.  Please bring ALL of your medications with you to every visit.   Today we talked about:  Gabapentin  - I am glad your gabapentin  has been helping your knee pain and anxiety.   Your anxiety is well controlled on hydroxyzine  and gabapentin . Please continue following with the psychiatry team. I have refilled the gabapentin  today.   I believe your constellation of symptoms with dizziness, headache, sore throat, ear pain are all due to a viral illness. This should improve with time. You can take tylenol  and ibuprofen  and alternate them to help with throat pain and headache. Make sure to stay hydrated. You can drink warm water with honey to help soothe the throat as well.   If your headache or dizziness worsens please let us  know.   Your blood pressure is quite elevated. Since you mentioned it is usually normal we can do a follow up in two weeks. Please check your blood pressure daily and we can go over the log next visit. Please use an arm cuff not a wrist cuff.   Thank you for choosing [facility].   Please call [phone] with any questions about today's appointment.  Areta Saliva, MD  Family Medicine    Blood Pressure Record Sheet To take your blood pressure, you will need a blood pressure machine. You can buy a blood pressure machine (blood pressure monitor) at your clinic, drug store, or online. When choosing one, consider: An automatic monitor that has an arm cuff. A cuff that wraps snugly around your upper arm. You should be able to fit only one finger between your arm and the cuff. A device that stores blood pressure reading results. Do not choose a monitor that measures your blood pressure from your wrist or finger. Follow your health care provider's instructions for how to take your blood pressure. To use this form: Take your blood pressure medications every day These measurements should be taken when you have been at rest for  at least 10-15 min Take at least 2 readings with each blood pressure check. This makes sure the results are correct. Wait 1-2 minutes between measurements. Write down the results in the spaces on this form. Keep in mind it should always be recorded systolic over diastolic. Both numbers are important.  Repeat this every day for 2-3 weeks, or as told by your health care provider.  Make a follow-up appointment with your health care provider to discuss the results.

## 2024-08-08 NOTE — Progress Notes (Signed)
" ° °  SUBJECTIVE:   CHIEF COMPLAINT / HPI:  Discussed the use of AI scribe software for clinical note transcription with the patient, who gave verbal consent to proceed.  History of Present Illness Catherine [name] is a 43 year old female with generalized anxiety disorder and PTSD who presents for a follow-up of her anxiety and medication refill of gabapentin .  Anxiety and post-traumatic stress symptoms - Generalized anxiety disorder and PTSD. Being followed by psychiatry  - Gabapentin  300 mg BID as recommended by psychiatrist was filled by Dr. Jennelle previously, kidney function appropriate.  - Anxiety well controlled at this time.  - Was trialed on Quetiapine , but no longer on this for many many months and has not needed it.   Knee pain - Gabapentin  provides relief for knee pain as well and controlled OA pain.  - Discontinued Celebrex  for approximately one month due to improved pain control with gabapentin   Dizziness and Headache  - Onset since Thursday - Triggered by bending over, sitting with eyes closed, turning head, or bending - Episodes last approximately one to two minutes - Feels pressure in the front of the face. No light headedness - No vision changes.   Otolaryngologic symptoms - Burning and pain sensation in throat and ears, left ear worse than right - Uses over-the-counter Tylenol  Severe Sinus for sinus and allergy symptoms - Takes Zyrtec  daily - Uses Flonase  during allergy season but finds it unhelpful recently  - Tried saline spray   Constitutional symptoms and sick contacts - No fever - Does endorse recent exposure to sick contacts (niece and grandbaby)    PERTINENT  PMH / PSH: Migraines, OSA, Asthma, GAD  OBJECTIVE:  BP (!) 140/103   Pulse 84   Ht 5' 7 (1.702 m)   Wt (!) 452 lb 9.6 oz (205.3 kg)   SpO2 100%   BMI 70.89 kg/m    General: well appearing, in no acute distress HEENT: no conjunctivitis, frontal sinus tender to palpation, no tenderness to  palpation of maxillary sinuses, oropharynx erythematous without any plaques, shoddy cervical lymphadenopathy  Bilateral TM with mild effusion and some bulging  CV: RRR, radial pulses equal and palpable Resp: Normal work of breathing on room air, CTAB Neuro: Alert   GAD 7: 0   ASSESSMENT/PLAN:   Assessment & Plan Mood disorder Anxiety and PTSD managed with hydroxyzine  and gabapentin .  - Continue hydroxyzine  10 mg TID as needed for anxiety. - Continue gabapentin  300 mg BID. - Refill gabapentin  prescription. Chronic pain of both knees Knee pain managed with gabapentin  after discontinuing Celecoxib  due to potential for side effects from long term use. Gabapentin  effective. - Continue gabapentin  for knee pain management. Viral URI Acute viral upper respiratory infection with eustachian tube dysfunction and allergic rhinitis Viral infection with dizziness, burning throat, ear pain, and headaches. Allergic rhinitis exacerbates symptoms. Most likely dizziness due to eustachian tube dysfunction. Headaches related to viral syndrome.  Symptoms expected to resolve with time. - Continue Flonase  for allergy symptoms. - Use honey and warm water for throat pain relief . - Consider over-the-counter throat lozenges for throat pain. - Stay hydrated. - Alternate ibuprofen  with acetaminophen  for pain management.  Follow up with me in 2 weeks    Areta Saliva, MD [facility] "

## 2024-08-11 NOTE — Assessment & Plan Note (Signed)
 Anxiety and PTSD managed with hydroxyzine  and gabapentin .  - Continue hydroxyzine  10 mg TID as needed for anxiety. - Continue gabapentin  300 mg BID. - Refill gabapentin  prescription.

## 2024-08-25 ENCOUNTER — Encounter: Payer: Self-pay | Admitting: Family Medicine

## 2024-08-25 ENCOUNTER — Ambulatory Visit: Payer: PRIVATE HEALTH INSURANCE | Admitting: Family Medicine

## 2024-08-25 VITALS — BP 137/94 | HR 108 | Ht 67.0 in | Wt >= 6400 oz

## 2024-08-25 DIAGNOSIS — R030 Elevated blood-pressure reading, without diagnosis of hypertension: Secondary | ICD-10-CM

## 2024-08-25 DIAGNOSIS — R7303 Prediabetes: Secondary | ICD-10-CM

## 2024-08-25 LAB — POCT GLYCOSYLATED HEMOGLOBIN (HGB A1C): Hemoglobin A1C: 5.5 % (ref 4.0–5.6)

## 2024-08-25 NOTE — Progress Notes (Signed)
   SUBJECTIVE:   CHIEF COMPLAINT / HPI:  Discussed the use of AI scribe software for clinical note transcription with the patient, who gave verbal consent to proceed.  History of Present Illness Catherine [name] is a 43 year old female with hypertension who presents for a blood pressure follow-up.  Blood pressure monitoring and control - Uses a home blood pressure monitor that syncs with her phone - Systolic blood pressure readings are generally within normal limits - Diastolic blood pressure readings are occasionally elevated to stage 1   Cardiometabolic risk factors - No history of diabetes - Former smoker - Recently had blood drawn for lab tests for job, will send via clinical cytogeneticist     PERTINENT  PMH / PSH: Obesity   OBJECTIVE:  BP (!) 137/94 (BP Location: Right Arm, Patient Position: Sitting, Cuff Size: Large) Comment (BP Location): upper arm  Pulse (!) 108   Ht 5' 7 (1.702 m)   Wt (!) 451 lb 3.2 oz (204.7 kg)   SpO2 98%   BMI 70.67 kg/m    General: well appearing, in no acute distress CV: RRR, radial pulses equal and palpable, no BLE edema  Resp: Normal work of breathing on room air, CTAB Abd: Soft, non tender, non distended  Neuro: Alert & Oriented x 4   ASSESSMENT/PLAN:   Assessment & Plan Elevated BP reading w/ no diagnosis of HTN Blood pressure mostly normal or stage 1 with occasional elevated diastolic readings. Prefers lifestyle modifications. Does not have significant other cardiovascular risk factors other than BMI.  - Continue home blood pressure monitoring. - Recommended decreasing alcohol use, incorporating potassium salt, reducing other salt intake, increasing physical activity  - Reassess blood pressure in three months. - Fu copy of patient's labs BMP and lipid panel  Prediabetes Previous A1c 5.8  - recheck A1c     Areta Saliva, MD [facility]

## 2024-08-25 NOTE — Patient Instructions (Signed)
 It was wonderful to see you today.  Please bring ALL of your medications with you to every visit.   Today we talked about:  Blood pressure - Your blood pressure was elevated here but your log looks beautiful. Please send me your labs cholesterol and kidney function (basic metabolic panel) Even if you had stage 1 hypertension I would recommend increasing exercise, decreasing salt, trying to incorporate potassium salt. I will check you A1c today.   Thank you for choosing [facility].   Please call [phone] with any questions about today's appointment.  Areta Saliva, MD  Family Medicine

## 2024-08-26 ENCOUNTER — Ambulatory Visit: Payer: Self-pay | Admitting: Family Medicine

## 2024-08-29 ENCOUNTER — Other Ambulatory Visit: Payer: Self-pay

## 2024-08-29 DIAGNOSIS — R609 Edema, unspecified: Secondary | ICD-10-CM

## 2024-08-29 MED ORDER — FUROSEMIDE 20 MG PO TABS: 20.0000 mg | ORAL_TABLET | Freq: Two times a day (BID) | ORAL | 2 refills | Status: AC

## 2024-09-14 ENCOUNTER — Encounter: Payer: Self-pay | Admitting: Family Medicine

## 2024-09-22 MED ORDER — SPIRONOLACTONE 25 MG PO TABS: 12.5000 mg | ORAL_TABLET | Freq: Every day | ORAL | 3 refills | Status: AC

## 2024-09-22 NOTE — Telephone Encounter (Signed)
 Patient returns call to nurse line regarding medication request for spironolactone.   Patient is also requesting instructions on how to stop furosemide  and start spironolactone.   Please advise.   Chiquita JAYSON English, RN

## 2024-09-29 ENCOUNTER — Other Ambulatory Visit: Payer: Self-pay

## 2024-09-29 DIAGNOSIS — Z6841 Body Mass Index (BMI) 40.0 and over, adult: Secondary | ICD-10-CM

## 2024-09-29 DIAGNOSIS — E282 Polycystic ovarian syndrome: Secondary | ICD-10-CM

## 2024-09-29 DIAGNOSIS — F39 Unspecified mood [affective] disorder: Secondary | ICD-10-CM

## 2024-09-29 DIAGNOSIS — R7303 Prediabetes: Secondary | ICD-10-CM

## 2024-09-30 ENCOUNTER — Ambulatory Visit: Payer: Self-pay

## 2024-09-30 LAB — BASIC METABOLIC PANEL WITH GFR
BUN/Creatinine Ratio: 18 (ref 9–23)
BUN/Creatinine Ratio: 18 (ref 9–23)
BUN: 18 mg/dL (ref 6–24)
BUN: 18 mg/dL (ref 6–24)
CO2: 23 mmol/L (ref 20–29)
CO2: 24 mmol/L (ref 20–29)
Calcium: 9.6 mg/dL (ref 8.7–10.2)
Calcium: 9.7 mg/dL (ref 8.7–10.2)
Chloride: 103 mmol/L (ref 96–106)
Chloride: 104 mmol/L (ref 96–106)
Creatinine, Ser: 1 mg/dL (ref 0.57–1.00)
Creatinine, Ser: 1.02 mg/dL — ABNORMAL HIGH (ref 0.57–1.00)
Glucose: 81 mg/dL (ref 70–99)
Glucose: 87 mg/dL (ref 70–99)
Potassium: 4.3 mmol/L (ref 3.5–5.2)
Potassium: 4.3 mmol/L (ref 3.5–5.2)
Sodium: 140 mmol/L (ref 134–144)
Sodium: 141 mmol/L (ref 134–144)
eGFR: 70 mL/min/1.73
eGFR: 72 mL/min/1.73

## 2024-10-02 ENCOUNTER — Ambulatory Visit: Payer: Self-pay | Admitting: Family Medicine

## 2024-10-11 ENCOUNTER — Other Ambulatory Visit: Payer: Self-pay | Admitting: *Deleted

## 2024-10-11 DIAGNOSIS — M79604 Pain in right leg: Secondary | ICD-10-CM

## 2024-10-11 MED ORDER — GABAPENTIN 300 MG PO CAPS: 300.0000 mg | ORAL_CAPSULE | Freq: Two times a day (BID) | ORAL | 1 refills | Status: AC

## 2024-10-20 ENCOUNTER — Other Ambulatory Visit: Payer: PRIVATE HEALTH INSURANCE

## 2024-10-20 DIAGNOSIS — R7303 Prediabetes: Secondary | ICD-10-CM

## 2024-10-20 DIAGNOSIS — E66813 Obesity, class 3: Secondary | ICD-10-CM

## 2024-10-20 DIAGNOSIS — E282 Polycystic ovarian syndrome: Secondary | ICD-10-CM

## 2024-10-21 LAB — BASIC METABOLIC PANEL WITH GFR
BUN/Creatinine Ratio: 14 (ref 9–23)
BUN: 13 mg/dL (ref 6–24)
CO2: 23 mmol/L (ref 20–29)
Calcium: 9.3 mg/dL (ref 8.7–10.2)
Chloride: 104 mmol/L (ref 96–106)
Creatinine, Ser: 0.96 mg/dL (ref 0.57–1.00)
Glucose: 78 mg/dL (ref 70–99)
Potassium: 4.1 mmol/L (ref 3.5–5.2)
Sodium: 143 mmol/L (ref 134–144)
eGFR: 76 mL/min/1.73

## 2024-11-16 ENCOUNTER — Encounter: Payer: Self-pay | Admitting: Internal Medicine

## 2024-11-16 ENCOUNTER — Ambulatory Visit: Admitting: Internal Medicine

## 2024-11-16 VITALS — BP 153/83 | Ht 67.0 in | Wt >= 6400 oz

## 2024-11-16 DIAGNOSIS — M549 Dorsalgia, unspecified: Secondary | ICD-10-CM | POA: Diagnosis not present

## 2024-11-16 MED ORDER — MELOXICAM 15 MG PO TABS: 15.0000 mg | ORAL_TABLET | Freq: Every day | ORAL | 2 refills | Status: AC | PRN

## 2024-11-16 MED ORDER — TIZANIDINE HCL 4 MG PO TABS: 4.0000 mg | ORAL_TABLET | Freq: Three times a day (TID) | ORAL | 0 refills | Status: AC | PRN

## 2024-11-16 NOTE — Progress Notes (Signed)
 "  Established Patient Office Visit  PCP: Catherine Credit, DO  Patient is a 44 y.o. female here for upper back pain x 1 month.  Pain began without any specific injury or inciting event that she can recall.  She points to the cervicothoracic region of her spine when asked where her  discomfort is located.  Pain is most severe along both trapezius muscles and periscapular regions, and is worse with movement in general.  She has been alternating naproxen  and ibuprofen , which are moderately effective in alleviating her pain.  She also tried taking Zanaflex  initially and found that it was helpful. She denies numbness or weakness in her upper extremities.  Pain is not worsened by movement of her neck.   Past Medical History:  Diagnosis Date   Allergy    Anxiety    Arthritis    Asthma    since baby, seasonal   ASTHMA, UNSPECIFIED 12/10/2006   Qualifier: Diagnosis of  By: Sharron Railing     CELLULITIS AND ABSCESS OF LEG EXCEPT FOOT 12/30/2007   Qualifier: Diagnosis of  By: Gladis FNP, Nykedtra     Cyst of bone    right side of spine   DEPENDENT EDEMA, LEGS, BILATERAL 11/22/2008   Qualifier: Diagnosis of  By: Loretha MD, Victoria     Depression [id number]   Edema    Food allergy    citris   FOOT PAIN, RIGHT 11/22/2008   Qualifier: Diagnosis of  By: Loretha MD, Victoria     Gastric ulcer    GERD (gastroesophageal reflux disease)    Gout    Hypertension    took Norvasc  for 3 months, doctor discontinued no longer on BP medications   Joint pain    KNEE INJURY, LEFT 01/01/2009   Qualifier: Diagnosis of  By: Adella MD, Elizabeth     Migraine    ONYCHOMYCOSIS, TOENAILS 11/22/2008   Qualifier: Diagnosis of  By: Loretha MD, Victoria     Shortness of breath    Sleep apnea    TINEA PEDIS 11/22/2008   Qualifier: Diagnosis of  By: Loretha MD, Victoria      Medications Ordered Prior to Encounter[1]  Past Surgical History:  Procedure Laterality Date   DILITATION & CURRETTAGE/HYSTROSCOPY WITH  NOVASURE ABLATION N/A 07/03/2017   Procedure: DILATATION & CURETTAGE/HYSTEROSCOPY WITH NOVASURE ABLATION;  Surgeon: Lavoie, Marie-Lyne, MD;  Location: [facility];  Service: Gynecology;  Laterality: N/A;   UPPER GI ENDOSCOPY     WISDOM TOOTH EXTRACTION  2008   x4    Allergies[2]  BP (!) 153/83   Ht 5' 7 (1.702 m)   Wt (!) 445 lb (201.9 kg)   BMI 69.70 kg/m       No data to display              No data to display              Objective:  Physical Exam:  Gen: NAD, comfortable in exam room  Cervicothoracic spine No gross deformity, scoliosis. TTP bilateral trapezius and periscapular regions.  No midline or bony TTP. FROM of the neck and shoulders Strength 5/5 all muscle groups of the upper extremities Sensation intact to light touch bilaterally.   Assessment and Plan:  Cervicothoracic back pain Cervicothoracic back pain x 1 month with no specific injury or inciting trauma.  No midline tenderness on exam but there is tenderness in the bilateral trapezius and periscapular regions.  Likely attributable to chronic strain.  No red flag symptoms  identified. -Recommend physical therapy as a mainstay of initial management - Meloxicam  15 mg daily as needed for pain relief  prescribed today.  Patient was instructed that this will replace naproxen  and ibuprofen . - Zanaflex  refilled at her request - Follow-up in 6 weeks for reassessment   Manus FORBES Fireman, MD     [1]  Current Outpatient Medications on File Prior to Visit  Medication Sig Dispense Refill   albuterol  (VENTOLIN  HFA) 108 (90 Base) MCG/ACT inhaler Inhale 2 puffs into the lungs every 6 (six) hours as needed for wheezing or shortness of breath. 8 g 0   albuterol  (VENTOLIN  HFA) 108 (90 Base) MCG/ACT inhaler Inhale 2 puffs into the lungs every 6 (six) hours as needed for wheezing or shortness of breath. 8 g 0   AMBULATORY NON FORMULARY MEDICATION Compression shorts 1 Units 0   cetirizine  (ZYRTEC ) 10 MG tablet Take 10 mg  by mouth daily.     famotidine  (PEPCID ) 20 MG tablet TAKE 1 TABLET BY MOUTH EVERYDAY AT BEDTIME 90 tablet 0   fluticasone  (FLONASE ) 50 MCG/ACT nasal spray Place 2 sprays into both nostrils daily. 16 g 0   furosemide  (LASIX ) 20 MG tablet Take 1 tablet (20 mg total) by mouth 2 (two) times daily. 180 tablet 2   gabapentin  (NEURONTIN ) 300 MG capsule Take 1 capsule (300 mg total) by mouth 2 (two) times daily. 60 capsule 1   hydrOXYzine  (ATARAX ) 10 MG tablet TAKE 1 TABLET BY MOUTH 3 TIMES DAILY AS NEEDED FOR ANXIETY. 30 tablet 3   metFORMIN  (GLUCOPHAGE -XR) 500 MG 24 hr tablet TAKE 1 TABLET BY MOUTH EVERY DAY WITH BREAKFAST (Patient not taking: Reported on 12/07/2023) 90 tablet 1   nystatin  cream (MYCOSTATIN ) Apply 1 Application topically 2 (two) times daily. 30 g 0   pantoprazole  (PROTONIX ) 40 MG tablet TAKE 1 TABLET (40 MG TOTAL) BY MOUTH DAILY. TAKE 30 MINUTES PRIOR TO BREAKFAST 90 tablet 1   spironolactone  (ALDACTONE ) 25 MG tablet Take 0.5 tablets (12.5 mg total) by mouth daily. 30 tablet 3   testosterone cypionate (DEPOTESTOSTERONE CYPIONATE) 200 MG/ML injection Inject 200 mg into the muscle every 7 (seven) days.     No current facility-administered medications on file prior to visit.  [2]  Allergies Allergen Reactions   Citrus Swelling   "

## 2024-11-30 ENCOUNTER — Ambulatory Visit: Payer: PRIVATE HEALTH INSURANCE

## 2024-12-28 ENCOUNTER — Ambulatory Visit: Payer: PRIVATE HEALTH INSURANCE | Admitting: Internal Medicine
